# Patient Record
Sex: Male | Born: 1940
Health system: Southern US, Community
[De-identification: ages and names within clinical notes are randomized; demographics above are authoritative.]

## PROBLEM LIST (undated history)

## (undated) DIAGNOSIS — R400 Somnolence: Secondary | ICD-10-CM

## (undated) DIAGNOSIS — I739 Peripheral vascular disease, unspecified: Secondary | ICD-10-CM

## (undated) DIAGNOSIS — E7800 Pure hypercholesterolemia, unspecified: Secondary | ICD-10-CM

## (undated) DIAGNOSIS — I10 Essential (primary) hypertension: Secondary | ICD-10-CM

## (undated) DIAGNOSIS — Z9989 Dependence on other enabling machines and devices: Secondary | ICD-10-CM

## (undated) DIAGNOSIS — K219 Gastro-esophageal reflux disease without esophagitis: Secondary | ICD-10-CM

## (undated) DIAGNOSIS — C61 Malignant neoplasm of prostate: Secondary | ICD-10-CM

## (undated) DIAGNOSIS — G7241 Inclusion body myositis [IBM]: Secondary | ICD-10-CM

## (undated) DIAGNOSIS — T148XXA Other injury of unspecified body region, initial encounter: Secondary | ICD-10-CM

## (undated) DIAGNOSIS — I509 Heart failure, unspecified: Secondary | ICD-10-CM

## (undated) DIAGNOSIS — H409 Unspecified glaucoma: Secondary | ICD-10-CM

## (undated) DIAGNOSIS — G4730 Sleep apnea, unspecified: Secondary | ICD-10-CM

## (undated) DIAGNOSIS — Z973 Presence of spectacles and contact lenses: Secondary | ICD-10-CM

## (undated) DIAGNOSIS — M1990 Unspecified osteoarthritis, unspecified site: Secondary | ICD-10-CM

## (undated) DIAGNOSIS — E119 Type 2 diabetes mellitus without complications: Secondary | ICD-10-CM

## (undated) DIAGNOSIS — Z77120 Contact with and (suspected) exposure to mold (toxic): Secondary | ICD-10-CM

## (undated) DIAGNOSIS — N189 Chronic kidney disease, unspecified: Secondary | ICD-10-CM

## (undated) DIAGNOSIS — E785 Hyperlipidemia, unspecified: Secondary | ICD-10-CM

## (undated) HISTORY — DX: Other injury of unspecified body region, initial encounter: T14.8XXA

## (undated) HISTORY — DX: Hyperlipidemia, unspecified: E78.5

## (undated) HISTORY — PX: PROSTATE BIOPSY: SHX241

## (undated) HISTORY — DX: Somnolence: R40.0

## (undated) HISTORY — DX: Contact with and (suspected) exposure to mold (toxic): Z77.120

---

## 2004-08-31 ENCOUNTER — Emergency Department (HOSPITAL_COMMUNITY): Admission: EM | Admit: 2004-08-31 | Discharge: 2004-08-31 | Payer: Self-pay | Admitting: Emergency Medicine

## 2004-10-16 ENCOUNTER — Emergency Department (HOSPITAL_COMMUNITY): Admission: EM | Admit: 2004-10-16 | Discharge: 2004-10-16 | Payer: Self-pay | Admitting: Internal Medicine

## 2012-10-02 ENCOUNTER — Encounter (HOSPITAL_COMMUNITY): Payer: Self-pay

## 2012-10-02 ENCOUNTER — Emergency Department (HOSPITAL_COMMUNITY): Payer: Medicare Other

## 2012-10-02 ENCOUNTER — Inpatient Hospital Stay (HOSPITAL_COMMUNITY): Payer: Medicare Other | Admitting: Certified Registered"

## 2012-10-02 ENCOUNTER — Encounter (HOSPITAL_COMMUNITY): Payer: Self-pay | Admitting: Certified Registered"

## 2012-10-02 ENCOUNTER — Observation Stay (HOSPITAL_COMMUNITY)
Admission: EM | Admit: 2012-10-02 | Discharge: 2012-10-04 | Disposition: A | Discharge status: Home / Self Care | Payer: Medicare Other | Attending: Orthopedic Surgery | Admitting: Orthopedic Surgery

## 2012-10-02 ENCOUNTER — Encounter (HOSPITAL_COMMUNITY)
Admission: EM | Disposition: A | Discharge status: Home / Self Care | Payer: Self-pay | Source: Home / Self Care | Attending: Emergency Medicine

## 2012-10-02 ENCOUNTER — Inpatient Hospital Stay (HOSPITAL_COMMUNITY): Payer: Medicare Other

## 2012-10-02 DIAGNOSIS — S82891A Other fracture of right lower leg, initial encounter for closed fracture: Secondary | ICD-10-CM

## 2012-10-02 DIAGNOSIS — W19XXXA Unspecified fall, initial encounter: Secondary | ICD-10-CM | POA: Insufficient documentation

## 2012-10-02 DIAGNOSIS — Z87891 Personal history of nicotine dependence: Secondary | ICD-10-CM | POA: Insufficient documentation

## 2012-10-02 DIAGNOSIS — S82843A Displaced bimalleolar fracture of unspecified lower leg, initial encounter for closed fracture: Principal | ICD-10-CM | POA: Insufficient documentation

## 2012-10-02 DIAGNOSIS — I10 Essential (primary) hypertension: Secondary | ICD-10-CM | POA: Insufficient documentation

## 2012-10-02 DIAGNOSIS — Z79899 Other long term (current) drug therapy: Secondary | ICD-10-CM | POA: Insufficient documentation

## 2012-10-02 DIAGNOSIS — K219 Gastro-esophageal reflux disease without esophagitis: Secondary | ICD-10-CM | POA: Insufficient documentation

## 2012-10-02 DIAGNOSIS — Z7982 Long term (current) use of aspirin: Secondary | ICD-10-CM | POA: Insufficient documentation

## 2012-10-02 DIAGNOSIS — E119 Type 2 diabetes mellitus without complications: Secondary | ICD-10-CM | POA: Insufficient documentation

## 2012-10-02 DIAGNOSIS — G5790 Unspecified mononeuropathy of unspecified lower limb: Secondary | ICD-10-CM | POA: Insufficient documentation

## 2012-10-02 DIAGNOSIS — Z794 Long term (current) use of insulin: Secondary | ICD-10-CM | POA: Insufficient documentation

## 2012-10-02 HISTORY — DX: Essential (primary) hypertension: I10

## 2012-10-02 HISTORY — DX: Type 2 diabetes mellitus without complications: E11.9

## 2012-10-02 HISTORY — PX: ORIF ANKLE FRACTURE: SHX5408

## 2012-10-02 HISTORY — DX: Gastro-esophageal reflux disease without esophagitis: K21.9

## 2012-10-02 HISTORY — DX: Unspecified glaucoma: H40.9

## 2012-10-02 LAB — CBC WITH DIFFERENTIAL/PLATELET
Basophils Absolute: 0 10*3/uL (ref 0.0–0.1)
Basophils Relative: 0 % (ref 0–1)
Eosinophils Absolute: 0.2 10*3/uL (ref 0.0–0.7)
Eosinophils Relative: 2 % (ref 0–5)
HCT: 43.4 % (ref 39.0–52.0)
Lymphs Abs: 2.4 10*3/uL (ref 0.7–4.0)
Monocytes Absolute: 0.7 10*3/uL (ref 0.1–1.0)
Monocytes Relative: 9 % (ref 3–12)
Neutro Abs: 4 10*3/uL (ref 1.7–7.7)
Neutrophils Relative %: 56 % (ref 43–77)

## 2012-10-02 LAB — BASIC METABOLIC PANEL
BUN: 17 mg/dL (ref 6–23)
Calcium: 9.5 mg/dL (ref 8.4–10.5)
Creatinine, Ser: 1.25 mg/dL (ref 0.50–1.35)
GFR calc non Af Amer: 57 mL/min — ABNORMAL LOW (ref >90)
Glucose, Bld: 277 mg/dL — ABNORMAL HIGH (ref 70–99)
Sodium: 133 mEq/L — ABNORMAL LOW (ref 135–145)

## 2012-10-02 LAB — GLUCOSE, CAPILLARY
Glucose-Capillary: 94 mg/dL (ref 70–99)
Glucose-Capillary: 88 mg/dL (ref 70–99)

## 2012-10-02 SURGERY — OPEN REDUCTION INTERNAL FIXATION (ORIF) ANKLE FRACTURE
Anesthesia: General | Site: Ankle | Laterality: Right | Wound class: Clean

## 2012-10-02 MED ORDER — PROPOFOL 10 MG/ML IV BOLUS
INTRAVENOUS | Status: DC | PRN
  Administered 2012-10-02: 150 mg via INTRAVENOUS

## 2012-10-02 MED ORDER — MORPHINE SULFATE 4 MG/ML IJ SOLN
INTRAMUSCULAR | Status: AC
  Administered 2012-10-02: 3 mg via INTRAVENOUS
  Filled 2012-10-02: qty 1

## 2012-10-02 MED ORDER — ACETAMINOPHEN 10 MG/ML IV SOLN
INTRAVENOUS | Status: AC
  Filled 2012-10-02: qty 100

## 2012-10-02 MED ORDER — CEFAZOLIN SODIUM-DEXTROSE 2-3 GM-% IV SOLR
INTRAVENOUS | Status: DC | PRN
  Administered 2012-10-02: 2 g via INTRAVENOUS

## 2012-10-02 MED ORDER — ACETAMINOPHEN 10 MG/ML IV SOLN
INTRAVENOUS | Status: DC | PRN
  Administered 2012-10-02: 1000 mg via INTRAVENOUS

## 2012-10-02 MED ORDER — ONDANSETRON HCL 4 MG/2ML IJ SOLN
INTRAMUSCULAR | Status: DC | PRN
  Administered 2012-10-02: 4 mg via INTRAVENOUS

## 2012-10-02 MED ORDER — BLISTEX EX OINT
TOPICAL_OINTMENT | CUTANEOUS | Status: AC
  Administered 2012-10-02: 1
  Filled 2012-10-02: qty 10

## 2012-10-02 MED ORDER — CEFAZOLIN SODIUM-DEXTROSE 2-3 GM-% IV SOLR
INTRAVENOUS | Status: AC
  Filled 2012-10-02: qty 50

## 2012-10-02 MED ORDER — FENTANYL CITRATE 0.05 MG/ML IJ SOLN
INTRAMUSCULAR | Status: DC | PRN
  Administered 2012-10-02 – 2012-10-03 (×3): 100 ug via INTRAVENOUS

## 2012-10-02 MED ORDER — 0.9 % SODIUM CHLORIDE (POUR BTL) OPTIME
TOPICAL | Status: DC | PRN
  Administered 2012-10-02: 2000 mL

## 2012-10-02 MED ORDER — DEXTROSE 50 % IV SOLN
INTRAVENOUS | Status: AC
  Filled 2012-10-02: qty 50

## 2012-10-02 MED ORDER — SUCCINYLCHOLINE CHLORIDE 20 MG/ML IJ SOLN
INTRAMUSCULAR | Status: DC | PRN
  Administered 2012-10-02: 140 mg via INTRAVENOUS

## 2012-10-02 MED ORDER — SODIUM CHLORIDE 0.9 % IV SOLN
INTRAVENOUS | Status: DC
  Administered 2012-10-02 (×2): via INTRAVENOUS

## 2012-10-02 MED ORDER — MORPHINE SULFATE 4 MG/ML IJ SOLN
3.0000 mg | INTRAMUSCULAR | Status: DC | PRN
  Administered 2012-10-02 (×2): 3 mg via INTRAVENOUS
  Filled 2012-10-02 (×2): qty 1

## 2012-10-02 MED ORDER — HYDROCODONE-ACETAMINOPHEN 5-325 MG PO TABS
2.0000 | ORAL_TABLET | Freq: Once | ORAL | Status: AC
  Administered 2012-10-02: 2 via ORAL
  Filled 2012-10-02: qty 2

## 2012-10-02 MED ORDER — DEXTROSE 50 % IV SOLN
INTRAVENOUS | Status: DC | PRN
  Administered 2012-10-02: .5 via INTRAVENOUS

## 2012-10-02 MED ORDER — BUPIVACAINE HCL (PF) 0.5 % IJ SOLN
INTRAMUSCULAR | Status: AC
  Filled 2012-10-02: qty 30

## 2012-10-02 MED ORDER — MIDAZOLAM HCL 5 MG/5ML IJ SOLN
INTRAMUSCULAR | Status: DC | PRN
  Administered 2012-10-02: 2 mg via INTRAVENOUS

## 2012-10-02 MED ORDER — LIDOCAINE HCL (CARDIAC) 20 MG/ML IV SOLN
INTRAVENOUS | Status: DC | PRN
  Administered 2012-10-02: 30 mg via INTRAVENOUS

## 2012-10-02 MED ORDER — PHENYLEPHRINE HCL 10 MG/ML IJ SOLN
INTRAMUSCULAR | Status: DC | PRN
  Administered 2012-10-02: 80 ug via INTRAVENOUS
  Administered 2012-10-02: 120 ug via INTRAVENOUS

## 2012-10-02 SURGICAL SUPPLY — 47 items
1.3 KWIRE ×4 IMPLANT
BAG ZIPLOCK 12X15 (MISCELLANEOUS) ×2 IMPLANT
BANDAGE ELASTIC 4 VELCRO ST LF (GAUZE/BANDAGES/DRESSINGS) ×2 IMPLANT
BANDAGE ELASTIC 6 VELCRO ST LF (GAUZE/BANDAGES/DRESSINGS) ×2 IMPLANT
BIT DRILL 2.7 QC CANN 155 (BIT) ×2 IMPLANT
BIT DRILL 3.5 QC 155 (BIT) ×2 IMPLANT
BIT DRILL QC 2.7 6.3IN  SHORT (BIT) ×1
BIT DRILL QC 2.7 6.3IN SHORT (BIT) ×1 IMPLANT
BNDG ESMARK 4X9 LF (GAUZE/BANDAGES/DRESSINGS) ×2 IMPLANT
CLOTH BEACON ORANGE TIMEOUT ST (SAFETY) ×2 IMPLANT
COVER SURGICAL LIGHT HANDLE (MISCELLANEOUS) ×2 IMPLANT
CUFF TOURN SGL QUICK 34 (TOURNIQUET CUFF) ×1
CUFF TRNQT CYL 34X4X40X1 (TOURNIQUET CUFF) ×1 IMPLANT
DECANTER SPIKE VIAL GLASS SM (MISCELLANEOUS) ×2 IMPLANT
DRAPE C-ARM 42X120 X-RAY (DRAPES) ×2 IMPLANT
DRAPE U-SHAPE 47X51 STRL (DRAPES) ×4 IMPLANT
DRSG PAD ABDOMINAL 8X10 ST (GAUZE/BANDAGES/DRESSINGS) ×10 IMPLANT
ELECT REM PT RETURN 9FT ADLT (ELECTROSURGICAL) ×2
ELECTRODE REM PT RTRN 9FT ADLT (ELECTROSURGICAL) ×1 IMPLANT
GAUZE XEROFORM 1X8 LF (GAUZE/BANDAGES/DRESSINGS) ×4 IMPLANT
GAUZE XEROFORM 5X9 LF (GAUZE/BANDAGES/DRESSINGS) ×2 IMPLANT
GLOVE SURG ORTHO 8.0 STRL STRW (GLOVE) ×2 IMPLANT
GOWN STRL NON-REIN LRG LVL3 (GOWN DISPOSABLE) ×2 IMPLANT
NEEDLE HYPO 22GX1.5 SAFETY (NEEDLE) ×2 IMPLANT
NS IRRIG 1000ML POUR BTL (IV SOLUTION) ×2 IMPLANT
PACK LOWER EXTREMITY WL (CUSTOM PROCEDURE TRAY) ×2 IMPLANT
PAD CAST 4YDX4 CTTN HI CHSV (CAST SUPPLIES) ×1 IMPLANT
PADDING CAST COTTON 4X4 STRL (CAST SUPPLIES) ×1
PADDING CAST COTTON 6X4 STRL (CAST SUPPLIES) ×2 IMPLANT
PLATE FIBULA 5 HOLE (Plate) ×2 IMPLANT
POSITIONER SURGICAL ARM (MISCELLANEOUS) ×2 IMPLANT
SCREW CANNULATED 4.1X40 (Screw) ×8 IMPLANT
SCREW LOCK 3.5X14 (Screw) ×2 IMPLANT
SCREW LOCK 3.5X8 (Screw) ×2 IMPLANT
SCREW NL 3.5X14 (Screw) ×4 IMPLANT
SCREW NL 3.5X22 (Screw) ×2 IMPLANT
SCREW NLCK 16X3.5XST CORT PRLC (Screw) ×2 IMPLANT
SCREW NONLOCK 3.5X16 (Screw) ×2 IMPLANT
SPONGE GAUZE 4X4 12PLY (GAUZE/BANDAGES/DRESSINGS) ×2 IMPLANT
STOCKINETTE 8 INCH (MISCELLANEOUS) ×2 IMPLANT
SUT ETHILON 3 0 PS 1 (SUTURE) ×10 IMPLANT
SUT ETHILON 4 0 PS 2 18 (SUTURE) ×4 IMPLANT
SUT VIC AB 2-0 CT1 27 (SUTURE) ×2
SUT VIC AB 2-0 CT1 TAPERPNT 27 (SUTURE) ×2 IMPLANT
SYR CONTROL 10ML LL (SYRINGE) ×2 IMPLANT
TOWEL OR 17X26 10 PK STRL BLUE (TOWEL DISPOSABLE) ×4 IMPLANT
WATER STERILE IRR 1500ML POUR (IV SOLUTION) ×2 IMPLANT

## 2012-10-02 NOTE — ED Notes (Signed)
Orthopedic technician at bedside to apply splint.

## 2012-10-02 NOTE — Anesthesia Preprocedure Evaluation (Addendum)
Anesthesia Evaluation  Patient identified by MRN, date of birth, ID band Patient awake    Reviewed: Allergy & Precautions, H&P , NPO status , Patient's Chart, lab work & pertinent test results  Airway Mallampati: III TM Distance: >3 FB Neck ROM: Full    Dental  (+) Teeth Intact and Dental Advisory Given   Pulmonary neg pulmonary ROS, former smoker,  breath sounds clear to auscultation  Pulmonary exam normal       Cardiovascular hypertension, Pt. on medications Rhythm:Regular Rate:Normal     Neuro/Psych negative neurological ROS  negative psych ROS   GI/Hepatic Neg liver ROS, GERD-  Medicated,  Endo/Other  diabetes, Type 2, Insulin DependentMorbid obesity  Renal/GU negative Renal ROS  negative genitourinary   Musculoskeletal negative musculoskeletal ROS (+)   Abdominal   Peds  Hematology negative hematology ROS (+)   Anesthesia Other Findings   Reproductive/Obstetrics                          Anesthesia Physical Anesthesia Plan  ASA: III  Anesthesia Plan: General   Post-op Pain Management:    Induction: Intravenous  Airway Management Planned: Oral ETT  Additional Equipment:   Intra-op Plan:   Post-operative Plan: Extubation in OR  Informed Consent: I have reviewed the patients History and Physical, chart, labs and discussed the procedure including the risks, benefits and alternatives for the proposed anesthesia with the patient or authorized representative who has indicated his/her understanding and acceptance.   Dental advisory given  Plan Discussed with: CRNA  Anesthesia Plan Comments:         Anesthesia Quick Evaluation

## 2012-10-02 NOTE — ED Notes (Signed)
Pt c/o R ankle injury, after a fall this morning.  Pain score 5/10.  Sts "it felt like my knee gave away and I rolled my R ankle."  Sts he has broken R ankle before x 2.  Denies numbness and tingling.  Swelling noted.

## 2012-10-02 NOTE — H&P (Signed)
Andrew Larsen is an 72 y.o. male.   Chief Complaint: Right ankle pain HPI: Andrew Larsen is an ambulatory 72 year old patient with right ankle pain. He sustained a fall today when his right knee gave out and he heard 2 pops in his been noted to weight-bear since that time he reports pain in the right ankle but denies any loss of consciousness or any other orthopedic complaints. There is no family history of deep vein thrombosis or pulmonary embolism. Patient does have diabetes which is under reasonable control with recent hemoglobin A1c 7-1/2 approximately per patient's wife. Patient's wife does have cancer and is under chemotherapy treatment.  Past Medical History  Diagnosis Date  . Diabetes mellitus without complication   . Hypertension   . Glaucoma   . GERD (gastroesophageal reflux disease)     History reviewed. No pertinent past surgical history.  History reviewed. No pertinent family history. Social History:  reports that he has quit smoking. He has never used smokeless tobacco. He reports that he does not drink alcohol or use illicit drugs.  Allergies: Not on File  Medications Prior to Admission  Medication Sig Dispense Refill  . Ascorbic Acid 500 MG CAPS Take 2 capsules by mouth 2 (two) times daily.      Marland Kitchen aspirin EC 81 MG tablet Take 81 mg by mouth every evening.      . brimonidine-timolol (COMBIGAN) 0.2-0.5 % ophthalmic solution Place 1 drop into both eyes 2 (two) times daily.      . fish oil-omega-3 fatty acids 1000 MG capsule Take 1 g by mouth every morning.      . gabapentin (NEURONTIN) 300 MG capsule Take 300 mg by mouth 3 (three) times daily.      . insulin lispro protamine-lispro (HUMALOG 75/25) (75-25) 100 UNIT/ML SUSP Inject 44-49 Units into the skin 2 (two) times daily. 44 units in the morning, 49 units in the evening      . latanoprost (XALATAN) 0.005 % ophthalmic solution Place 1 drop into both eyes at bedtime.      Marland Kitchen losartan (COZAAR) 50 MG tablet Take 50 mg by mouth every  morning.      . Multiple Vitamins-Minerals (CENTRUM SILVER ADULT 50+ PO) Take 1 tablet by mouth every morning.      Marland Kitchen omeprazole (PRILOSEC) 40 MG capsule Take 40 mg by mouth every morning.      . Saw Palmetto, Serenoa repens, (SAW PALMETTO PO) Take 2 capsules by mouth 2 (two) times daily.      Marland Kitchen triamterene-hydrochlorothiazide (DYAZIDE) 37.5-25 MG per capsule Take 1 capsule by mouth every morning.        Results for orders placed during the hospital encounter of 10/02/12 (from the past 48 hour(s))  CBC WITH DIFFERENTIAL     Status: None   Collection Time    10/02/12 10:17 AM      Result Value Range   WBC 7.2  4.0 - 10.5 K/uL   RBC 4.89  4.22 - 5.81 MIL/uL   Hemoglobin 15.3  13.0 - 17.0 g/dL   HCT 16.1  09.6 - 04.5 %   MCV 88.8  78.0 - 100.0 fL   MCH 31.3  26.0 - 34.0 pg   MCHC 35.3  30.0 - 36.0 g/dL   RDW 40.9  81.1 - 91.4 %   Platelets 215  150 - 400 K/uL   Neutrophils Relative % 56  43 - 77 %   Neutro Abs 4.0  1.7 - 7.7 K/uL  Lymphocytes Relative 33  12 - 46 %   Lymphs Abs 2.4  0.7 - 4.0 K/uL   Monocytes Relative 9  3 - 12 %   Monocytes Absolute 0.7  0.1 - 1.0 K/uL   Eosinophils Relative 2  0 - 5 %   Eosinophils Absolute 0.2  0.0 - 0.7 K/uL   Basophils Relative 0  0 - 1 %   Basophils Absolute 0.0  0.0 - 0.1 K/uL  BASIC METABOLIC PANEL     Status: Abnormal   Collection Time    10/02/12 10:17 AM      Result Value Range   Sodium 133 (*) 135 - 145 mEq/L   Potassium 4.4  3.5 - 5.1 mEq/L   Chloride 95 (*) 96 - 112 mEq/L   CO2 29  19 - 32 mEq/L   Glucose, Bld 277 (*) 70 - 99 mg/dL   BUN 17  6 - 23 mg/dL   Creatinine, Ser 1.61  0.50 - 1.35 mg/dL   Calcium 9.5  8.4 - 09.6 mg/dL   GFR calc non Af Amer 57 (*) >90 mL/min   GFR calc Af Amer 66 (*) >90 mL/min   Comment:            The eGFR has been calculated     using the CKD EPI equation.     This calculation has not been     validated in all clinical     situations.     eGFR's persistently     <90 mL/min signify      possible Chronic Kidney Disease.  SURGICAL PCR SCREEN     Status: None   Collection Time    10/02/12  3:13 PM      Result Value Range   MRSA, PCR NEGATIVE  NEGATIVE   Staphylococcus aureus NEGATIVE  NEGATIVE   Comment:            The Xpert SA Assay (FDA     approved for NASAL specimens     in patients over 74 years of age),     is one component of     a comprehensive surveillance     program.  Test performance has     been validated by [employer] for patients greater     than or equal to 31 year old.     It is not intended     to diagnose infection nor to     guide or monitor treatment.  GLUCOSE, CAPILLARY     Status: Abnormal   Collection Time    10/02/12  4:31 PM      Result Value Range   Glucose-Capillary 140 (*) 70 - 99 mg/dL   Dg Chest 2 View  0/07/5407   *RADIOLOGY REPORT*  Clinical Data: Fall.  Preop right ankle surgery.Diabetic.  Prior smoker.  CHEST - 2 VIEW  Comparison: None.  Findings: Heart and mediastinal contours are within normal limits. No focal opacities or effusions.  No acute bony abnormality.  IMPRESSION: No active cardiopulmonary disease.   Original Report Authenticated By: Charlett Nose, M.D.   Dg Ankle 2 Views Right  10/02/2012   *RADIOLOGY REPORT*  Clinical Data: Right ankle pain post fall  RIGHT ANKLE - 2 VIEW  Comparison: 08/31/2004  Findings: Two views of the right ankle submitted.  There is oblique displaced fracture of distal fibula.  Mild displaced fracture of distal tibia medial malleolus.  Disruption of the ankle mortise with mild medial  displacement of the distal tibia and fibula. There is soft tissue swelling adjacent to medial and lateral malleolus.  IMPRESSION:  There is oblique displaced fracture of distal fibula.  Mild displaced fracture of distal tibia medial malleolus.  Disruption of the ankle mortise with mild medial displacement of the distal tibia and fibula.  There is soft tissue swelling adjacent to medial and lateral malleolus.   Original  Report Authenticated By: Natasha Mead, M.D.    Review of Systems  Constitutional: Negative.   HENT: Negative.   Eyes: Negative.   Respiratory: Negative.   Cardiovascular: Negative.   Gastrointestinal: Negative.   Genitourinary: Negative.   Musculoskeletal: Positive for joint pain.  Skin: Negative.   Neurological: Negative.   Endo/Heme/Allergies: Negative.   Psychiatric/Behavioral: Negative.     Blood pressure 136/77, pulse 85, temperature 98.9 F (37.2 C), temperature source Oral, resp. rate 16, height 5\' 11"  (1.803 m), weight 113.399 kg (250 lb), SpO2 97.00%. Physical Exam  Constitutional: He appears well-developed.  HENT:  Head: Normocephalic.  Eyes: Pupils are equal, round, and reactive to light.  Neck: Normal range of motion.  Cardiovascular: Normal rate.   Respiratory: Effort normal.  GI: Soft.  Neurological: He is alert.  Skin: Skin is warm.   examination the right ankle demonstrates medial and lateral swelling no calf tenderness palpable pedal pulses diminished sensation of the dorsum plantar aspect of the foot no lesser toe deformities no other ulcerations full-thickness skin changes in the skin on the foot knee is examined and has reasonable range of motion with no effusion in tact extensor mechanism  Assessment/Plan Impression is displaced bimalleolar ankle fracture on the right in a diabetic patient. Plan open reduction internal fixation of medial and lateral malleolus. Risk and benefits discussed with the patient including but limited to infection incomplete healing nerve vessel damage need for more surgery. All questions answered.  DEAN,GREGORY SCOTT 10/02/2012, 6:59 PM

## 2012-10-02 NOTE — ED Provider Notes (Signed)
History     CSN: [id number]  Arrival date & time 10/02/12  0746   First MD Initiated Contact with Patient 10/02/12 [phone]      Chief Complaint  Patient presents with  . Ankle Injury    (Consider location/radiation/quality/duration/timing/severity/associated sxs/prior treatment) Patient is a 72 y.o. male presenting with lower extremity injury.  Ankle Injury   patient here after injuring his right ankle this morning by twisting it when his knee became weak. Denies any pain. Can walk on his ankle but has severe sharp pain. Noted swelling in her Pop. Denies any paresthesias to his foot. No treatment used prior to arrival. Does have a history of prior ankle injury  Past Medical History  Diagnosis Date  . Diabetes mellitus without complication   . Hypertension   . Glaucoma   . GERD (gastroesophageal reflux disease)     History reviewed. No pertinent past surgical history.  History reviewed. No pertinent family history.  History  Substance Use Topics  . Smoking status: Former Games developer  . Smokeless tobacco: Never Used  . Alcohol Use: No      Review of Systems  All other systems reviewed and are negative.    Allergies  Review of patient's allergies indicates not on file.  Home Medications  No current outpatient prescriptions on file.  BP 156/77  Pulse 85  Temp(Src) 98.9 F (37.2 C) (Oral)  Resp 16  SpO2 97%  Physical Exam  Nursing note and vitals reviewed. Constitutional: He is oriented to person, place, and time. He appears well-developed and well-nourished.  Non-toxic appearance. No distress.  HENT:  Head: Normocephalic and atraumatic.  Eyes: Conjunctivae, EOM and lids are normal. Pupils are equal, round, and reactive to light.  Neck: Normal range of motion. Neck supple. No tracheal deviation present. No mass present.  Cardiovascular: Normal rate, regular rhythm and normal heart sounds.  Exam reveals no gallop.   No murmur heard. Pulmonary/Chest: Effort normal  and breath sounds normal. No stridor. No respiratory distress. He has no decreased breath sounds. He has no wheezes. He has no rhonchi. He has no rales.  Abdominal: Soft. Normal appearance and bowel sounds are normal. He exhibits no distension. There is no tenderness. There is no rebound and no CVA tenderness.  Musculoskeletal: Normal range of motion. He exhibits no edema and no tenderness.       Feet:  Neurological: He is alert and oriented to person, place, and time. He has normal strength. No cranial nerve deficit or sensory deficit. GCS eye subscore is 4. GCS verbal subscore is 5. GCS motor subscore is 6.  Skin: Skin is warm and dry. No abrasion and no rash noted.  Psychiatric: He has a normal mood and affect. His speech is normal and behavior is normal.    ED Course  Procedures (including critical care time)  Labs Reviewed - No data to display No results found.   No diagnosis found.    MDM  Pt given vicodin for pain anjd feels better--spoke with dr. August Saucer, and he will take the pt to the or later today--pts pain controlled        Toy Baker, MD 10/02/12 [phone]

## 2012-10-02 NOTE — ED Notes (Signed)
Patient transported to X-ray 

## 2012-10-02 NOTE — Progress Notes (Signed)
Patient reports feeling like his glucose is low. CBG result 88. Attempt to notify Dr. August Saucer.  Surgery ready for patient. Will take patient to surgery room.

## 2012-10-03 ENCOUNTER — Observation Stay (HOSPITAL_COMMUNITY): Payer: Medicare Other

## 2012-10-03 LAB — GLUCOSE, CAPILLARY
Glucose-Capillary: 131 mg/dL — ABNORMAL HIGH (ref 70–99)
Glucose-Capillary: 132 mg/dL — ABNORMAL HIGH (ref 70–99)
Glucose-Capillary: 179 mg/dL — ABNORMAL HIGH (ref 70–99)
Glucose-Capillary: 189 mg/dL — ABNORMAL HIGH (ref 70–99)
Glucose-Capillary: 250 mg/dL — ABNORMAL HIGH (ref 70–99)

## 2012-10-03 LAB — PROTIME-INR
INR: 0.96 (ref 0.00–1.49)
Prothrombin Time: 12.7 seconds (ref 11.6–15.2)

## 2012-10-03 MED ORDER — BRIMONIDINE TARTRATE 0.2 % OP SOLN
1.0000 [drp] | Freq: Two times a day (BID) | OPHTHALMIC | Status: DC
  Administered 2012-10-03 – 2012-10-04 (×3): 1 [drp] via OPHTHALMIC
  Filled 2012-10-03: qty 5

## 2012-10-03 MED ORDER — PANTOPRAZOLE SODIUM 40 MG PO TBEC
40.0000 mg | DELAYED_RELEASE_TABLET | Freq: Every day | ORAL | Status: DC
  Administered 2012-10-03 – 2012-10-04 (×2): 40 mg via ORAL
  Filled 2012-10-03 (×2): qty 1

## 2012-10-03 MED ORDER — MORPHINE SULFATE 2 MG/ML IJ SOLN
1.0000 mg | INTRAMUSCULAR | Status: DC | PRN
  Administered 2012-10-03: 1 mg via INTRAVENOUS
  Filled 2012-10-03: qty 1

## 2012-10-03 MED ORDER — WARFARIN VIDEO
Freq: Once | Status: AC
  Administered 2012-10-03: 18:00:00

## 2012-10-03 MED ORDER — COUMADIN BOOK
Freq: Once | Status: AC
  Administered 2012-10-03: 13:00:00
  Filled 2012-10-03: qty 1

## 2012-10-03 MED ORDER — LATANOPROST 0.005 % OP SOLN
1.0000 [drp] | Freq: Every day | OPHTHALMIC | Status: DC
  Administered 2012-10-03 (×2): 1 [drp] via OPHTHALMIC
  Filled 2012-10-03: qty 2.5

## 2012-10-03 MED ORDER — WARFARIN SODIUM 7.5 MG PO TABS
7.5000 mg | ORAL_TABLET | Freq: Once | ORAL | Status: AC
  Administered 2012-10-03: 7.5 mg via ORAL
  Filled 2012-10-03: qty 1

## 2012-10-03 MED ORDER — METOCLOPRAMIDE HCL 5 MG/ML IJ SOLN: 5.0000 mg | Freq: Three times a day (TID) | INTRAMUSCULAR | Status: DC | PRN

## 2012-10-03 MED ORDER — LOSARTAN POTASSIUM 50 MG PO TABS
50.0000 mg | ORAL_TABLET | Freq: Every morning | ORAL | Status: DC
  Administered 2012-10-03 – 2012-10-04 (×2): 50 mg via ORAL
  Filled 2012-10-03 (×2): qty 1

## 2012-10-03 MED ORDER — WARFARIN SODIUM 5 MG PO TABS: 5.0000 mg | ORAL_TABLET | Freq: Every day | ORAL | Status: DC

## 2012-10-03 MED ORDER — WARFARIN - PHARMACIST DOSING INPATIENT: Freq: Every day | Status: DC

## 2012-10-03 MED ORDER — METOCLOPRAMIDE HCL 10 MG PO TABS: 5.0000 mg | ORAL_TABLET | Freq: Three times a day (TID) | ORAL | Status: DC | PRN

## 2012-10-03 MED ORDER — OXYCODONE HCL 5 MG PO TABS
5.0000 mg | ORAL_TABLET | ORAL | Status: DC | PRN
  Administered 2012-10-03: 5 mg via ORAL
  Administered 2012-10-03 – 2012-10-04 (×7): 10 mg via ORAL
  Filled 2012-10-03: qty 2
  Filled 2012-10-03: qty 1
  Filled 2012-10-03 (×6): qty 2

## 2012-10-03 MED ORDER — CEFAZOLIN SODIUM-DEXTROSE 2-3 GM-% IV SOLR
2.0000 g | Freq: Four times a day (QID) | INTRAVENOUS | Status: AC
  Administered 2012-10-03 (×3): 2 g via INTRAVENOUS
  Filled 2012-10-03 (×3): qty 50

## 2012-10-03 MED ORDER — LACTATED RINGERS IV SOLN
INTRAVENOUS | Status: DC | PRN
  Administered 2012-10-02: 23:00:00 via INTRAVENOUS

## 2012-10-03 MED ORDER — INSULIN ASPART 100 UNIT/ML ~~LOC~~ SOLN
0.0000 [IU] | Freq: Three times a day (TID) | SUBCUTANEOUS | Status: DC
  Administered 2012-10-03 (×3): 3 [IU] via SUBCUTANEOUS
  Administered 2012-10-04: 8 [IU] via SUBCUTANEOUS
  Administered 2012-10-04: 5 [IU] via SUBCUTANEOUS

## 2012-10-03 MED ORDER — OXYCODONE HCL 5 MG PO TABS: 5.0000 mg | ORAL_TABLET | ORAL | Status: DC | PRN

## 2012-10-03 MED ORDER — ONDANSETRON HCL 4 MG PO TABS: 4.0000 mg | ORAL_TABLET | Freq: Four times a day (QID) | ORAL | Status: DC | PRN

## 2012-10-03 MED ORDER — LACTATED RINGERS IV SOLN: INTRAVENOUS | Status: DC

## 2012-10-03 MED ORDER — HYDROMORPHONE HCL PF 1 MG/ML IJ SOLN: 0.2500 mg | INTRAMUSCULAR | Status: DC | PRN

## 2012-10-03 MED ORDER — POTASSIUM CHLORIDE IN NACL 20-0.9 MEQ/L-% IV SOLN
INTRAVENOUS | Status: AC
  Administered 2012-10-03: 03:00:00 via INTRAVENOUS
  Filled 2012-10-03: qty 1000

## 2012-10-03 MED ORDER — GABAPENTIN 300 MG PO CAPS
300.0000 mg | ORAL_CAPSULE | Freq: Three times a day (TID) | ORAL | Status: DC
  Administered 2012-10-03 – 2012-10-04 (×4): 300 mg via ORAL
  Filled 2012-10-03 (×6): qty 1

## 2012-10-03 MED ORDER — BRIMONIDINE TARTRATE-TIMOLOL 0.2-0.5 % OP SOLN
1.0000 [drp] | Freq: Two times a day (BID) | OPHTHALMIC | Status: DC
  Filled 2012-10-03 (×19): qty 0.1

## 2012-10-03 MED ORDER — TIMOLOL MALEATE 0.5 % OP SOLN
1.0000 [drp] | Freq: Two times a day (BID) | OPHTHALMIC | Status: DC
  Administered 2012-10-03 – 2012-10-04 (×3): 1 [drp] via OPHTHALMIC
  Filled 2012-10-03: qty 5

## 2012-10-03 MED ORDER — ONDANSETRON HCL 4 MG/2ML IJ SOLN: 4.0000 mg | Freq: Four times a day (QID) | INTRAMUSCULAR | Status: DC | PRN

## 2012-10-03 MED ORDER — PROMETHAZINE HCL 25 MG/ML IJ SOLN: 6.2500 mg | INTRAMUSCULAR | Status: DC | PRN

## 2012-10-03 MED ORDER — TRIAMTERENE-HCTZ 37.5-25 MG PO TABS
1.0000 | ORAL_TABLET | Freq: Every morning | ORAL | Status: DC
  Administered 2012-10-03 – 2012-10-04 (×2): 1 via ORAL
  Filled 2012-10-03 (×2): qty 1

## 2012-10-03 NOTE — Transfer of Care (Signed)
Immediate Anesthesia Transfer of Care Note  Patient: Andrew Larsen  Procedure(s) Performed: Procedure(s): OPEN REDUCTION INTERNAL FIXATION (ORIF) ANKLE FRACTURE (Right)  Patient Location: PACU  Anesthesia Type:General  Level of Consciousness: awake, alert , patient cooperative and responds to stimulation  Airway & Oxygen Therapy: Patient Spontanous Breathing and Patient connected to face mask oxygen  Post-op Assessment: Report given to PACU RN, Post -op Vital signs reviewed and stable and Patient moving all extremities X 4  Post vital signs: stable  Complications: No apparent anesthesia complications

## 2012-10-03 NOTE — Progress Notes (Signed)
Rehab Admissions Coordinator Note:  Patient was screened by Clois Dupes for appropriateness for an Inpatient Acute Rehab Consult. [employer] Medicare will not approve admission for this diagnosis. At this time, we are recommending Skilled Nursing Facility.  Clois Dupes 10/03/2012, 12:20 PM  I can be reached at [phone].

## 2012-10-03 NOTE — Op Note (Signed)
NAME:  TAVI, HOOGENDOORN NO.:  [id number]  MEDICAL RECORD NO.:  [id number]  LOCATION:  1605                         FACILITY:  [facility]  PHYSICIAN:  Burnard Bunting, M.D.    DATE OF BIRTH:  [birth year]  DATE OF PROCEDURE: DATE OF DISCHARGE:                              OPERATIVE REPORT   PREOPERATIVE DIAGNOSIS:  Right bimalleolar ankle fracture.  POSTOPERATIVE DIAGNOSIS:  Right bimalleolar ankle fracture.Marland Kitchen  PROCEDURE:  Open reduction and internal fixation with bimalleolar ankle fracture.  SURGEON:  Burnard Bunting, M.D.  ASSIST:  None.  ANESTHESIA:  General.  ESTIMATED BLOOD LOSS:  Minimal.  TOURNIQUET:  Ankle Esmarch utilized for approximately 35 minutes.  INDICATIONS:  Tatem Fesler is a patient with unstable bimalleolar ankle fracture presents for operative management after explanation of risks, benefits.  PROCEDURE IN DETAIL:  The patient was brought to operating room, where general endotracheal anesthesia was induced.  Preop antibiotics administered.  Time-out was called.  Right leg was prescrubbed with alcohol and Betadine.  Area was then prepped with DuraPrep solution and draped in a sterile manner.  Collier Flowers was used cover the operative field. Leg was elevated and exsanguinated using Esmarch wrap for an ankle Esmarch just above the anticipated level fracture fixation.  Skin and subcu tissue was sharply divided on the lateral side.  Care was taken to avoid injury to superficial peroneal nerve.  Fracture was visualized and reduced.  Lag screw was placed anterior to posterior.  Then Katrinka Blazing and Nephew 5-hole plate was placed after fluoroscopic examination and confirmation of anatomic reduction.  Attention was then turned toward the medial side.  Incision was made over the medial malleolus.  Skin and subcu tissues were sharply divided.  Fracture was reduced.  Two 40-mm cannulated cancellous screws were placed, and the mortise was reduced in the AP, lateral, and  mortise planes.  Good reduction achieved.  The patient had good ankle range of motion.  The syndesmosis was stable.  At this time, ankle Esmarch was released.  Both incisions were thoroughly irrigated and closed using interrupted inverted 2-0 Vicryl and 3-0 nylon.  Bulky well-padded posterior splint was applied.  The patient tolerated the procedure without immediate complications with plan to discharge in the a.m. touchdown weightbearing.     Burnard Bunting, M.D.     GSD/MEDQ  D:  10/02/2012  T:  10/03/2012  Job:  [id number]

## 2012-10-03 NOTE — Progress Notes (Signed)
ANTICOAGULATION CONSULT NOTE - Initial Consult  Pharmacy Consult for Warfarin Indication: VTE prophylaxis  Not on File  Patient Measurements: Height: 5\' 11"  (180.3 cm) Weight: 250 lb (113.399 kg) IBW/kg (Calculated) : 75.3   Vital Signs: Temp: 97.7 F (36.5 C) (06/06 0135) Temp src: Oral (06/05 2121) BP: 150/86 mmHg (06/06 0135) Pulse Rate: 84 (06/06 0135)  Labs:  Recent Labs  10/02/12 1017  HGB 15.3  HCT 43.4  PLT 215  CREATININE 1.25    Estimated Creatinine Clearance: 70.4 ml/min (by C-G formula based on Cr of 1.25).   Medical History: Past Medical History  Diagnosis Date  . Diabetes mellitus without complication   . Hypertension   . Glaucoma   . GERD (gastroesophageal reflux disease)     Medications:  Scheduled:  . brimonidine  1 drop Both Eyes BID   And  . timolol  1 drop Both Eyes BID  .  ceFAZolin (ANCEF) IV  2 g Intravenous Q6H  . gabapentin  300 mg Oral TID  . insulin aspart  0-15 Units Subcutaneous TID WC  . latanoprost  1 drop Both Eyes QHS  . losartan  50 mg Oral q morning - 10a  . pantoprazole  40 mg Oral Daily  . triamterene-hydrochlorothiazide  1 tablet Oral q morning - 10a   Infusions:  . 0.9 % NaCl with KCl 20 mEq / L    . lactated ringers      Assessment: 72 yo s/p surgery for right ankle fracture. Warfarin per Rx for VTE prophylaxis ordered. (7 points)  Goal of Therapy:  INR 2-3    Plan:   Baseline INR=0.96 this am  Warfarin 7.5mg  x1 today  Daily PT/INR  Education  Susanne Greenhouse R 10/03/2012,2:11 AM

## 2012-10-03 NOTE — Care Management Note (Addendum)
    Page 1 of 2   10/04/2012     12:08:27 PM   CARE MANAGEMENT NOTE 10/04/2012  Patient:  Andrew Larsen, Andrew Larsen   Account Number:  [id number]  Date Initiated:  10/03/2012  Documentation initiated by:  [name]  Subjective/Objective Assessment:   dx rt bimalleolar ankle fracture: ORIF     Action/Plan:   CM spoke with patient; Wants inpatient rehab 1st choice and home with hh as 2nd choice for discharge plans.   Anticipated DC Date:  10/04/2012   Anticipated DC Plan:  HOME W HOME HEALTH SERVICES  In-house referral  Clinical Social Worker      DC Associate Professor  CM consult      [facility] Choice  HOME HEALTH  DURABLE MEDICAL EQUIPMENT   Choice offered to / List presented to:  C-1 Patient   DME arranged  WHEELCHAIR - MANUAL  3-N-1  Levan Hurst      DME agency  [facility]     HH arranged  HH-1 RN  HH-2 PT      [facility] agency  [facility]   Status of service:  Completed, signed off Medicare Important Message given?   (If response is "NO", the following Medicare IM given date fields will be blank) Date Medicare IM given:   Date Additional Medicare IM given:    Discharge Disposition:  HOME W HOME HEALTH SERVICES  Per UR Regulation:  Reviewed for med. necessity/level of care/duration of stay  If discussed at Long Length of Stay Meetings, dates discussed:    Comments:  10/04/12 [name] RN,BSN NCM [phone] 12P SPOKE TO NURSE-[name] NEXT PT/INR DUE TOMORROW.[name] REP AWARE FOR HHRN-HOME VISIT TOMORROW FOR PT/INR LAB DRAW,SEND RESULTS TO ORTHOPEDIC DR.PER [facility] REP [name] HHPT MAY START ON MONDAY 10/06/12.DTR-Andrew Larsen MADE AWARE ALSO. PATIENT WILL STAY W/DTR-Andrew Larsen ADDRESS:[address], [place] [address] H#[phone],C#[phone].TC [name] [facility] REP MADER HER AWARE OF ADDRESS WHERE PATIENT WILL STAY,WILL ALSO FAX INTO [facility] FAX#W/ADDRESS ON FACE SHEET,FAX# W/CONFIRMATION #[phone]. [facility] DME BROUGHT TO RM,W/C,RW,3N1.[facility]  CHOSEN FOR HHRN/PT SPOKE TO [name] REP TEL#[phone], HAS ALL INFO,& AWARE OF D/C TODAY W/HH.SPOKE TO PATIENT/SPOUSE/DTR ABOUT D/C PLANS,& IN AGREEMENT TO PLAN.  10/03/2012 [name] BSN RN CCM [phone] Per progress notes  pt's insurance will not approve inpatient rehab. Cm talked with patient and his current plans are to go home in [place] where spouse and other family members will be caregivers. He will need wheelchair with cushion and elevated leg rest on right, 3n1 and RW. Pt will also require HHrn for coumadin management and HHpt services. Pt's daughter has requested Andrew Larsen for [facility] services. TCT [place] rep who advised that they would be able to services patient. Pt and daughter advised.  [facility] delivered 3n1 and RW to patient's room. Wheelchair will be delivered to patient's room tomorrow 10/04/2012.

## 2012-10-03 NOTE — Evaluation (Signed)
Physical Therapy Evaluation Patient Details Name: Andrew Larsen MRN: [id number] DOB: 1941-01-24 Today's Date: 10/03/2012 Time: 5784-6962 PT Time Calculation (min): 23 min  PT Assessment / Plan / Recommendation Clinical Impression  Pt presents with bimalleolar ankle fracture s/p ORIF and is currently TDWB.  Tolerated OOB and a few hops forward, however with increased difficulty following WB status and has increased pain.  Pt will benefit from skilled PT in acute venue to address deficits.  PT recommends CIR consult to determine if appropriate for CIR.  If not, then pt will need to consider ST SNF placement.      PT Assessment  Patient needs continued PT services    Follow Up Recommendations  CIR;Supervision/Assistance - 24 hour    Does the patient have the potential to tolerate intense rehabilitation      Barriers to Discharge Decreased caregiver support wife is with him 24/7, however she is currently undergoing chemotherapy.     Equipment Recommendations  Rolling walker with 5" wheels (w/c and cushion if D/C home)    Recommendations for Other Services     Frequency Min 4X/week    Precautions / Restrictions Precautions Precautions: Fall Restrictions Weight Bearing Restrictions: Yes RLE Weight Bearing: Touchdown weight bearing   Pertinent Vitals/Pain 6/10, premedicated.       Mobility  Bed Mobility Bed Mobility: Supine to Sit Supine to Sit: 4: Min assist;HOB elevated Details for Bed Mobility Assistance: Assist for RLE out of bed with cues for hand placement and technique to self assist.  Transfers Transfers: Sit to Stand;Stand to Sit Sit to Stand: 1: +2 Total assist;From elevated surface;With upper extremity assist;From bed Sit to Stand: Patient Percentage: 60% Stand to Sit: 1: +2 Total assist;With upper extremity assist;With armrests;To chair/3-in-1 Stand to Sit: Patient Percentage: 60% Details for Transfer Assistance: Assist to rise, steady and ensure controlled  descent with MAX cues for maintaining TDWB status and hand placement.   Ambulation/Gait Ambulation/Gait Assistance: 1: +2 Total assist Ambulation/Gait: Patient Percentage: 50% Ambulation Distance (Feet): 4 Feet Assistive device: Rolling walker Ambulation/Gait Assistance Details: Cues for sequencing/technique with RW, upright posture and MAX cues for maintaining TDWB status.  Pt only able to hop forward a few small steps due to increased pain and inability to maintain TDWB status.   Gait Pattern: Step-to pattern;Antalgic;Trunk flexed Gait velocity: very slow Stairs: No Wheelchair Mobility Wheelchair Mobility: No    Exercises     PT Diagnosis: Difficulty walking;Abnormality of gait;Generalized weakness;Acute pain  PT Problem List: Decreased strength;Decreased range of motion;Decreased activity tolerance;Decreased balance;Decreased mobility;Decreased coordination;Decreased knowledge of use of DME;Decreased safety awareness;Pain PT Treatment Interventions: DME instruction;Gait training;Functional mobility training;Therapeutic activities;Therapeutic exercise;Balance training;Patient/family education   PT Goals Acute Rehab PT Goals PT Goal Formulation: With patient Time For Goal Achievement: 10/10/12 Potential to Achieve Goals: Good Pt will go Supine/Side to Sit: with supervision PT Goal: Supine/Side to Sit - Progress: Goal set today Pt will go Sit to Supine/Side: with supervision PT Goal: Sit to Supine/Side - Progress: Goal set today Pt will go Sit to Stand: with min assist PT Goal: Sit to Stand - Progress: Goal set today Pt will go Stand to Sit: with min assist PT Goal: Stand to Sit - Progress: Goal set today Pt will Ambulate: 16 - 50 feet;with least restrictive assistive device;with min assist PT Goal: Ambulate - Progress: Goal set today Additional Goals Additional Goal #1: Pt will be able to maintain TDWB status throughout all mobility.  PT Goal: Additional Goal #1 - Progress: Goal  set today  Visit Information  Last PT Received On: 10/03/12 Assistance Needed: +2    Subjective Data  Subjective: It doesn't hurt until I move Patient Stated Goal: to return home if possible   Prior Functioning  Home Living Lives With: Spouse Available Help at Discharge: Family;Available 24 hours/day Type of Home: Apartment Home Access: Level entry Home Layout: One level Bathroom Shower/Tub: Engineer, manufacturing systems: Standard Home Adaptive Equipment: None Prior Function Level of Independence: Independent Able to Take Stairs?: Yes Driving: Yes Vocation: Retired Musician: No difficulties    Copywriter, advertising Arousal/Alertness: Awake/alert Behavior During Therapy: WFL for tasks assessed/performed Overall Cognitive Status: Within Functional Limits for tasks assessed    Extremity/Trunk Assessment Right Lower Extremity Assessment RLE ROM/Strength/Tone: Unable to fully assess;Due to precautions;Due to pain RLE Sensation: WFL - Light Touch Left Lower Extremity Assessment LLE ROM/Strength/Tone: WFL for tasks assessed LLE Sensation: WFL - Light Touch Trunk Assessment Trunk Assessment: Normal   Balance    End of Session PT - End of Session Equipment Utilized During Treatment: Gait belt Activity Tolerance: Patient limited by fatigue;Patient limited by pain Patient left: in chair;with call bell/phone within reach;with family/visitor present Nurse Communication: Mobility status  GP     Vista Deck 10/03/2012, 11:40 AM

## 2012-10-03 NOTE — Progress Notes (Signed)
Subjective: Pt stable - pain controlled   Objective: Vital signs in last 24 hours: Temp:  [96.9 F (36.1 C)-99 F (37.2 C)] 97.9 F (36.6 C) (06/06 1014) Pulse Rate:  [75-93] 78 (06/06 1014) Resp:  [8-17] 16 (06/06 1014) BP: (132-190)/(74-95) 148/80 mmHg (06/06 1014) SpO2:  [95 %-100 %] 100 % (06/06 1014) Weight:  [113.399 kg (250 lb)] 113.399 kg (250 lb) (06/05 1623)  Intake/Output from previous day: 06/05 0701 - 06/06 0700 In: 1915.3 [I.V.:1915.3] Out: 700 [Urine:700] Intake/Output this shift: Total I/O In: 240 [P.O.:240] Out: -   Exam:  Neurovascular intact Sensation intact distally Intact pulses distally  Labs:  Recent Labs  10/02/12 1017  HGB 15.3    Recent Labs  10/02/12 1017  WBC 7.2  RBC 4.89  HCT 43.4  PLT 215    Recent Labs  10/02/12 1017  NA 133*  K 4.4  CL 95*  CO2 29  BUN 17  CREATININE 1.25  GLUCOSE 277*  CALCIUM 9.5    Recent Labs  10/03/12 0445  INR 0.96    Assessment/Plan: Plan dc am - rx on chart   Andrew Larsen SCOTT 10/03/2012, 11:49 AM

## 2012-10-03 NOTE — Progress Notes (Signed)
Physical Therapy Treatment Patient Details Name: Andrew Larsen MRN: [id number] DOB: Dec 12, 1940 Today's Date: 10/03/2012 Time: 4098-1191 PT Time Calculation (min): 19 min  PT Assessment / Plan / Recommendation Comments on Treatment Session  Pt progressing better than this morning with amb and able to perform some w/c mobility.  Pt feels he will have enough assist at home.  Recommend HHPT for follow up at D/C    Follow Up Recommendations  Home health PT;Supervision/Assistance - 24 hour     Does the patient have the potential to tolerate intense rehabilitation     Barriers to Discharge        Equipment Recommendations  Rolling walker with 5" wheels;Wheelchair (measurements PT);Wheelchair cushion (measurements PT)    Recommendations for Other Services    Frequency Min 4X/week   Plan Discharge plan remains appropriate    Precautions / Restrictions Precautions Precautions: Fall Restrictions Weight Bearing Restrictions: Yes RLE Weight Bearing: Touchdown weight bearing   Pertinent Vitals/Pain 5/10 pain, elevated ice applied    Mobility  Bed Mobility Bed Mobility: Sit to Supine Sit to Supine: 4: Min assist;HOB flat Details for Bed Mobility Assistance: Assist for RLE into bed with cues for adjusting hips once in bed.  Transfers Transfers: Sit to Stand;Stand to Sit Sit to Stand: 4: Min assist;With upper extremity assist;With armrests;From chair/3-in-1 Stand to Sit: 4: Min assist;With upper extremity assist;To bed Details for Transfer Assistance: Assist to rise, steady and ensure controlled descent with min cues for maintaining TDWB status and hand placement.   Ambulation/Gait Ambulation/Gait Assistance: 4: Min assist;3: Mod assist Ambulation Distance (Feet): 18 Feet Assistive device: Rolling walker Ambulation/Gait Assistance Details: Pt doing much better this afternoon maintaining TDWB and able to ambulate into hallway with cues for sequencing/technique.  Gait Pattern: Step-to  pattern;Antalgic;Trunk flexed Gait velocity: very slow Corporate treasurer: Yes Wheelchair Assistance: 4: Barrister's clerk: Both upper extremities;Left lower extremity Wheelchair Parts Management: Needs assistance Distance: 200    Exercises     PT Diagnosis:    PT Problem List:   PT Treatment Interventions:     PT Goals Acute Rehab PT Goals PT Goal Formulation: With patient Time For Goal Achievement: 10/10/12 Potential to Achieve Goals: Good Pt will go Sit to Supine/Side: with supervision PT Goal: Sit to Supine/Side - Progress: Progressing toward goal Pt will go Sit to Stand: with supervision PT Goal: Sit to Stand - Progress: Updated due to goal met Pt will go Stand to Sit: with supervision PT Goal: Stand to Sit - Progress: Updated due to goals met Pt will Ambulate: 16 - 50 feet;with least restrictive assistive device;with min assist PT Goal: Ambulate - Progress: Progressing toward goal Additional Goals Additional Goal #1: Pt will be able to maintain TDWB status throughout all mobility.  PT Goal: Additional Goal #1 - Progress: Progressing toward goal  Visit Information  Last PT Received On: 10/03/12 Assistance Needed: +1    Subjective Data  Subjective: I can manage at home.  Patient Stated Goal: to return home if possible   Cognition  Cognition Arousal/Alertness: Awake/alert Behavior During Therapy: WFL for tasks assessed/performed Overall Cognitive Status: Within Functional Limits for tasks assessed    Balance     End of Session PT - End of Session Equipment Utilized During Treatment: Gait belt Activity Tolerance: Patient limited by pain;Patient limited by fatigue Patient left: in bed;with call bell/phone within reach;with family/visitor present Nurse Communication: Mobility status   GP     Vista Deck 10/03/2012, 3:44  PM   

## 2012-10-03 NOTE — Progress Notes (Signed)
CSW received consult from PT, Irving Burton that CIR was recommended though patient has [employer] Medicare Complete which might not cover for CIR. CSW spoke with patient & wife at bedside re: backup plan. Patient states that he'd rather return home with his wife than go to SNF. CSW made RNCM, Bonita Quin aware as patient was requesting wheelchair & rolling walker at discharge. CSW signing off.   Unice Bailey, LCSW [facility] Clinical Social Worker cell #: [phone]

## 2012-10-04 LAB — PROTIME-INR
INR: 1.04 (ref 0.00–1.49)
Prothrombin Time: 13.5 s (ref 11.6–15.2)

## 2012-10-04 LAB — CBC
HCT: 39.8 % (ref 39.0–52.0)
Hemoglobin: 13.4 g/dL (ref 13.0–17.0)
MCH: 29.8 pg (ref 26.0–34.0)
MCHC: 33.7 g/dL (ref 30.0–36.0)
MCV: 88.4 fL (ref 78.0–100.0)

## 2012-10-04 LAB — GLUCOSE, CAPILLARY
Glucose-Capillary: 258 mg/dL — ABNORMAL HIGH (ref 70–99)
Glucose-Capillary: 234 mg/dL — ABNORMAL HIGH (ref 70–99)

## 2012-10-04 MED ORDER — WARFARIN SODIUM 7.5 MG PO TABS: 7.5000 mg | ORAL_TABLET | Freq: Every day | ORAL | Status: DC

## 2012-10-04 MED ORDER — WARFARIN SODIUM 7.5 MG PO TABS: 7.5000 mg | ORAL_TABLET | Freq: Once | ORAL | Status: DC

## 2012-10-04 MED ORDER — WARFARIN SODIUM 7.5 MG PO TABS
7.5000 mg | ORAL_TABLET | Freq: Once | ORAL | Status: AC
  Administered 2012-10-04: 7.5 mg via ORAL
  Filled 2012-10-04: qty 1

## 2012-10-04 NOTE — Progress Notes (Signed)
Physical Therapy Treatment Patient Details Name: Andrew Larsen MRN: [id number] DOB: 04-Sep-1940 Today's Date: 10/04/2012 Time: 4098-1191 PT Time Calculation (min): 18 min  PT Assessment / Plan / Recommendation Comments on Treatment Session  Pt progressing very well with mobility.  Educated on exercises and also demonstrated how to bump up in w/c up the stairs at daughters home.  All family members verbalize understanding.     Follow Up Recommendations  Home health PT;Supervision/Assistance - 24 hour     Does the patient have the potential to tolerate intense rehabilitation     Barriers to Discharge        Equipment Recommendations  Rolling walker with 5" wheels;Wheelchair (measurements PT);Wheelchair cushion (measurements PT)    Recommendations for Other Services    Frequency Min 4X/week   Plan Discharge plan remains appropriate    Precautions / Restrictions Precautions Precautions: Fall Restrictions Weight Bearing Restrictions: Yes RLE Weight Bearing: Touchdown weight bearing   Pertinent Vitals/Pain 7/10 pain, RN aware and premedicated.     Mobility  Bed Mobility Bed Mobility: Supine to Sit Supine to Sit: 4: Min assist Details for Bed Mobility Assistance: Assist for RLE out of bed with cues for technique and hand placement on bed.  Transfers Transfers: Sit to Stand;Stand to Sit Sit to Stand: 4: Min guard;4: Min assist;From elevated surface;With upper extremity assist;From bed Stand to Sit: 4: Min guard;4: Min assist;With upper extremity assist;With armrests;To chair/3-in-1 Details for Transfer Assistance: Performed x 2 to w/c and to recliner.  Cues for hand placement and maintaining TDWB Ambulation/Gait Ambulation/Gait Assistance: 4: Min assist Ambulation Distance (Feet): 28 Feet Assistive device: Rolling walker Ambulation/Gait Assistance Details: Cues for sequencing/technique with RW and to maintain upright posture  Gait Pattern: Step-to pattern;Antalgic;Trunk  flexed Gait velocity: very slow Corporate treasurer: Yes Wheelchair Assistance: 5: Investment banker, operational: Both upper extremities;Left lower extremity Wheelchair Parts Management: Needs assistance Distance: 250    Exercises General Exercises - Lower Extremity Quad Sets: Strengthening;Right;10 reps Hip ABduction/ADduction: AAROM;Right;10 reps Straight Leg Raises: AAROM;Right;10 reps   PT Diagnosis:    PT Problem List:   PT Treatment Interventions:     PT Goals Acute Rehab PT Goals PT Goal Formulation: With patient Time For Goal Achievement: 10/10/12 Potential to Achieve Goals: Good Pt will go Supine/Side to Sit: with supervision PT Goal: Supine/Side to Sit - Progress: Progressing toward goal Pt will go Sit to Stand: with supervision PT Goal: Sit to Stand - Progress: Progressing toward goal Pt will go Stand to Sit: with supervision PT Goal: Stand to Sit - Progress: Progressing toward goal Pt will Ambulate: 16 - 50 feet;with least restrictive assistive device;with min assist PT Goal: Ambulate - Progress: Met Additional Goals Additional Goal #1: Pt will be able to maintain TDWB status throughout all mobility.  PT Goal: Additional Goal #1 - Progress: Met  Visit Information  Last PT Received On: 10/04/12 Assistance Needed: +1    Subjective Data  Subjective: I'm ready to get home.  Patient Stated Goal: to return home if possible   Cognition  Cognition Arousal/Alertness: Awake/alert Behavior During Therapy: WFL for tasks assessed/performed Overall Cognitive Status: Within Functional Limits for tasks assessed    Balance     End of Session PT - End of Session Equipment Utilized During Treatment: Gait belt Activity Tolerance: Patient limited by pain;Patient limited by fatigue Patient left: in chair;with call bell/phone within reach;with family/visitor present Nurse Communication: Mobility status   GP     Vista Deck 10/04/2012,  11:06 AM

## 2012-10-04 NOTE — Progress Notes (Signed)
Pt stable, scripts, and d/c instructions given with no questions/concerns voiced by pt or family.  Pt transported via wheelchair to private vehicle with NT and family.

## 2012-10-04 NOTE — Progress Notes (Addendum)
Subjective: 2 Days Post-Op Procedure(s) (LRB): OPEN REDUCTION INTERNAL FIXATION (ORIF) ANKLE FRACTURE (Right) Awake alert and oriented x4. Baseline peripheral neuropathy both lower extremities. Right short leg splint in good condition. Pain is mild. He has been able to walk with physical therapy half of the hallway and using a wheelchair for the rest. I suggested he may wish to try a Strool about a stroller for the right lower extremity. Patient reports pain as mild.    Objective:   VITALS:  Temp:  [97.7 F (36.5 C)-98.5 F (36.9 C)] 97.7 F (36.5 C) (06/07 0555) Pulse Rate:  [78-86] 86 (06/07 0555) Resp:  [16] 16 (06/07 0555) BP: (121-148)/(69-80) 135/76 mmHg (06/07 0555) SpO2:  [94 %-100 %] 94 % (06/07 0555)  Neurologically intact ABD soft Neurovascular intact Sensation intact distally Intact pulses distally Dorsiflexion/Plantar flexion intact Incision: dressing C/D/I   LABS  Recent Labs  10/02/12 1017 10/04/12 0452  HGB 15.3 13.4  WBC 7.2 7.8  PLT 215 182    Recent Labs  10/02/12 1017  NA 133*  K 4.4  CL 95*  CO2 29  BUN 17  CREATININE 1.25  GLUCOSE 277*    Recent Labs  10/03/12 0445 10/04/12 0452  INR 0.96 1.04     Assessment/Plan: 2 Days Post-Op Procedure(s) (LRB): OPEN REDUCTION INTERNAL FIXATION (ORIF) ANKLE FRACTURE (Right)  Advance diet Up with therapy Discharge home with home health Rxs given to wife and prints of xrays of the ORIF right ankle.  NITKA,JAMES E 10/04/2012, 8:51 AM

## 2012-10-04 NOTE — Progress Notes (Signed)
Pt wheelchair (equipment) delivered to room.

## 2012-10-04 NOTE — Progress Notes (Addendum)
ANTICOAGULATION CONSULT NOTE - Follow Up Consult  Pharmacy Consult for Coumadin Indication: VTE prophylaxis  Not on File  Patient Measurements: Height: 5\' 11"  (180.3 cm) Weight: 250 lb (113.399 kg) IBW/kg (Calculated) : 75.3  Vital Signs: Temp: 97.7 F (36.5 C) (06/07 0555) Temp src: Oral (06/07 0555) BP: 135/76 mmHg (06/07 0555) Pulse Rate: 86 (06/07 0555)  Labs:  Recent Labs  10/02/12 1017 10/03/12 0445 10/04/12 0452  HGB 15.3  --  13.4  HCT 43.4  --  39.8  PLT 215  --  182  LABPROT  --  12.7 13.5  INR  --  0.96 1.04  CREATININE 1.25  --   --     Estimated Creatinine Clearance: 70.4 ml/min (by C-G formula based on Cr of 1.25).   Assessment: 72 yo male w/ right ankle fracture s/p ORIF on 6/6. Warfarin per Rx for VTE prophylaxis started 6/6. DDI: On ASA 81, saw palmetto, fish-oil PTA.  Coumadin score = 7  INR 1.04 today, CBC wnl.  Plan discharge today on Coumadin 5mg  daily  Coumadin education completed 6/6   Goal of Therapy:  INR 2-3 Monitor platelets by anticoagulation protocol: Yes   Plan:   Will give Coumadin 7.5mg  at noon today since last dose was at 0800 yesterday.    MD: Consider discharging patient on Coumadin 7.5mg  daily with close PT/INR follow up.  Recommend discontinuing PTA Saw Palmetto given drug-drug interaction with Coumadin.  Please advise patient to not take Coumadin later today if discharge since dose given early in hospital as above (RN notified)   Pharmacy will f/u  Geoffry Paradise, PharmD, BCPS Pager: [phone] 8:31 AM Pharmacy #: [phone]

## 2012-10-07 ENCOUNTER — Encounter (HOSPITAL_COMMUNITY): Payer: Self-pay | Admitting: Orthopedic Surgery

## 2012-10-08 NOTE — Progress Notes (Signed)
10/03/12 1139  PT G-Codes **NOT FOR INPATIENT CLASS**  Functional Assessment Tool Used clincial judgement based of documentation from Harriet Butte, PT  Functional Limitation Mobility: Walking and moving around  Mobility: Walking and Moving Around Current Status [phone]) CK  Mobility: Walking and Moving Around Goal Status (U0454) CI  PT General Charges  $$ ACUTE PT VISIT 1 Procedure  PT Evaluation  $Initial PT Evaluation Tier I 1 Procedure  PT Treatments  $Therapeutic Activity 8-22 mins

## 2012-10-13 NOTE — Anesthesia Postprocedure Evaluation (Signed)
Anesthesia Post Note  Patient: Andrew Larsen  Procedure(s) Performed: Procedure(s) (LRB): OPEN REDUCTION INTERNAL FIXATION (ORIF) ANKLE FRACTURE (Right)  Anesthesia type: General  Patient location: PACU  Post pain: Pain level controlled  Post assessment: Post-op Vital signs reviewed  Last Vitals:  Filed Vitals:   10/04/12 1415  BP: 145/74  Pulse: 83  Temp: 36.6 C  Resp: 16    Post vital signs: Reviewed  Level of consciousness: sedated  Complications: No apparent anesthesia complications

## 2012-10-21 NOTE — Discharge Summary (Signed)
Physician Discharge Summary  Patient ID: Andrew Larsen MRN: [id number] DOB/AGE: 12-12-1940 72 y.o.  Admit date: 10/02/2012 Discharge date: 70/2014  Admission Diagnoses:  Right ankle fracture  Discharge Diagnoses:  Same  Surgeries: Procedure(s): OPEN REDUCTION INTERNAL FIXATION (ORIF) ANKLE FRACTURE on 10/02/2012 - 10/03/2012   Consultants:    Discharged Condition: Stable  Hospital Course: HOBIE KOHLES is an 72 y.o. male who was admitted 10/02/2012 with a chief complaint of  Chief Complaint  Patient presents with  . Ankle Injury  , and found to have a diagnosis of ankle fracture.  They were brought to the operating room on 10/02/2012 - 10/03/2012 and underwent the above named procedures. He was seen by PT and mobilized nwb RLE by the time of DC   Antibiotics given:  Anti-infectives   Start     Dose/Rate Route Frequency Ordered Stop   10/03/12 0400  ceFAZolin (ANCEF) IVPB 2 g/50 mL premix     2 g 100 mL/hr over 30 Minutes Intravenous Every 6 hours 10/03/12 0144 10/03/12 1544    .  Recent vital signs:  Filed Vitals:   10/04/12 1415  BP: 145/74  Pulse: 83  Temp: 97.8 F (36.6 C)  Resp: 16    Recent laboratory studies:  Results for orders placed during the hospital encounter of 10/02/12  SURGICAL PCR SCREEN      Result Value Range   MRSA, PCR NEGATIVE  NEGATIVE   Staphylococcus aureus NEGATIVE  NEGATIVE  CBC WITH DIFFERENTIAL      Result Value Range   WBC 7.2  4.0 - 10.5 K/uL   RBC 4.89  4.22 - 5.81 MIL/uL   Hemoglobin 15.3  13.0 - 17.0 g/dL   HCT 08.6  57.8 - 46.9 %   MCV 88.8  78.0 - 100.0 fL   MCH 31.3  26.0 - 34.0 pg   MCHC 35.3  30.0 - 36.0 g/dL   RDW 62.9  52.8 - 41.3 %   Platelets 215  150 - 400 K/uL   Neutrophils Relative % 56  43 - 77 %   Neutro Abs 4.0  1.7 - 7.7 K/uL   Lymphocytes Relative 33  12 - 46 %   Lymphs Abs 2.4  0.7 - 4.0 K/uL   Monocytes Relative 9  3 - 12 %   Monocytes Absolute 0.7  0.1 - 1.0 K/uL   Eosinophils Relative 2  0 - 5 %   Eosinophils Absolute 0.2  0.0 - 0.7 K/uL   Basophils Relative 0  0 - 1 %   Basophils Absolute 0.0  0.0 - 0.1 K/uL  BASIC METABOLIC PANEL      Result Value Range   Sodium 133 (*) 135 - 145 mEq/L   Potassium 4.4  3.5 - 5.1 mEq/L   Chloride 95 (*) 96 - 112 mEq/L   CO2 29  19 - 32 mEq/L   Glucose, Bld 277 (*) 70 - 99 mg/dL   BUN 17  6 - 23 mg/dL   Creatinine, Ser 2.44  0.50 - 1.35 mg/dL   Calcium 9.5  8.4 - 01.0 mg/dL   GFR calc non Af Amer 57 (*) >90 mL/min   GFR calc Af Amer 66 (*) >90 mL/min  GLUCOSE, CAPILLARY      Result Value Range   Glucose-Capillary 140 (*) 70 - 99 mg/dL  GLUCOSE, CAPILLARY      Result Value Range   Glucose-Capillary 94  70 - 99 mg/dL  GLUCOSE, CAPILLARY  Result Value Range   Glucose-Capillary 88  70 - 99 mg/dL   Comment 1 Call MD NNP PA CNM    GLUCOSE, CAPILLARY      Result Value Range   Glucose-Capillary 131 (*) 70 - 99 mg/dL  GLUCOSE, CAPILLARY      Result Value Range   Glucose-Capillary 132 (*) 70 - 99 mg/dL  PROTIME-INR      Result Value Range   Prothrombin Time 12.7  11.6 - 15.2 seconds   INR 0.96  0.00 - 1.49  GLUCOSE, CAPILLARY      Result Value Range   Glucose-Capillary 179 (*) 70 - 99 mg/dL   Comment 1 Notify RN     Comment 2 Documented in Chart    GLUCOSE, CAPILLARY      Result Value Range   Glucose-Capillary 189 (*) 70 - 99 mg/dL  GLUCOSE, CAPILLARY      Result Value Range   Glucose-Capillary 189 (*) 70 - 99 mg/dL  PROTIME-INR      Result Value Range   Prothrombin Time 13.5  11.6 - 15.2 seconds   INR 1.04  0.00 - 1.49  CBC      Result Value Range   WBC 7.8  4.0 - 10.5 K/uL   RBC 4.50  4.22 - 5.81 MIL/uL   Hemoglobin 13.4  13.0 - 17.0 g/dL   HCT 96.0  45.4 - 09.8 %   MCV 88.4  78.0 - 100.0 fL   MCH 29.8  26.0 - 34.0 pg   MCHC 33.7  30.0 - 36.0 g/dL   RDW 11.9  14.7 - 82.9 %   Platelets 182  150 - 400 K/uL  GLUCOSE, CAPILLARY      Result Value Range   Glucose-Capillary 180 (*) 70 - 99 mg/dL  GLUCOSE, CAPILLARY       Result Value Range   Glucose-Capillary 250 (*) 70 - 99 mg/dL  GLUCOSE, CAPILLARY      Result Value Range   Glucose-Capillary 258 (*) 70 - 99 mg/dL  GLUCOSE, CAPILLARY      Result Value Range   Glucose-Capillary 234 (*) 70 - 99 mg/dL    Discharge Medications:     Medication List    STOP taking these medications       aspirin EC 81 MG tablet     fish oil-omega-3 fatty acids 1000 MG capsule     SAW PALMETTO PO      TAKE these medications       Ascorbic Acid 500 MG Caps  Take 2 capsules by mouth 2 (two) times daily.     CENTRUM SILVER ADULT 50+ PO  Take 1 tablet by mouth every morning.     COMBIGAN 0.2-0.5 % ophthalmic solution  Generic drug:  brimonidine-timolol  Place 1 drop into both eyes 2 (two) times daily.     gabapentin 300 MG capsule  Commonly known as:  NEURONTIN  Take 300 mg by mouth 3 (three) times daily.     insulin lispro protamine-lispro (75-25) 100 UNIT/ML Susp  Commonly known as:  HUMALOG 75/25  Inject 44-49 Units into the skin 2 (two) times daily. 44 units in the morning, 49 units in the evening     latanoprost 0.005 % ophthalmic solution  Commonly known as:  XALATAN  Place 1 drop into both eyes at bedtime.     losartan 50 MG tablet  Commonly known as:  COZAAR  Take 50 mg by mouth every morning.     omeprazole  40 MG capsule  Commonly known as:  PRILOSEC  Take 40 mg by mouth every morning.     oxyCODONE 5 MG immediate release tablet  Commonly known as:  Oxy IR/ROXICODONE  Take 1-2 tablets (5-10 mg total) by mouth every 3 (three) hours as needed.     triamterene-hydrochlorothiazide 37.5-25 MG per capsule  Commonly known as:  DYAZIDE  Take 1 capsule by mouth every morning.     warfarin 7.5 MG tablet  Commonly known as:  COUMADIN  Take 1 tablet (7.5 mg total) by mouth once.     warfarin 7.5 MG tablet  Commonly known as:  COUMADIN  Take 1 tablet (7.5 mg total) by mouth daily.        Diagnostic Studies: Dg Chest 2 View  10/02/2012    *RADIOLOGY REPORT*  Clinical Data: Fall.  Preop right ankle surgery.Diabetic.  Prior smoker.  CHEST - 2 VIEW  Comparison: None.  Findings: Heart and mediastinal contours are within normal limits. No focal opacities or effusions.  No acute bony abnormality.  IMPRESSION: No active cardiopulmonary disease.   Original Report Authenticated By: Charlett Nose, M.D.   Dg Ankle 2 Views Right  10/02/2012   *RADIOLOGY REPORT*  Clinical Data: Right ankle pain post fall  RIGHT ANKLE - 2 VIEW  Comparison: 08/31/2004  Findings: Two views of the right ankle submitted.  There is oblique displaced fracture of distal fibula.  Mild displaced fracture of distal tibia medial malleolus.  Disruption of the ankle mortise with mild medial displacement of the distal tibia and fibula. There is soft tissue swelling adjacent to medial and lateral malleolus.  IMPRESSION:  There is oblique displaced fracture of distal fibula.  Mild displaced fracture of distal tibia medial malleolus.  Disruption of the ankle mortise with mild medial displacement of the distal tibia and fibula.  There is soft tissue swelling adjacent to medial and lateral malleolus.   Original Report Authenticated By: Natasha Mead, M.D.   Dg Ankle Complete Right  10/02/2012   *RADIOLOGY REPORT*  Clinical Data: ORIF right ankle fracture  DG C-ARM 1-60 MIN - NRPT [facility],RIGHT ANKLE - COMPLETE 3+ VIEW  Comparison: 10/02/2012  Findings: Three intraoperative fluoroscopic images demonstrate side plate and screw fixation of the previously seen oblique distal fibular fracture and screw fixation of the medial malleolar fracture.  Fracture fragments are in near anatomic alignment. Mortise is symmetric.  No evidence for hardware failure.  IMPRESSION: Expected intraoperative appearance after reduction of the distal fibular and tibial fractures as above.   Original Report Authenticated By: Christiana Pellant, M.D.   Dg Ankle Right Port  10/03/2012   *RADIOLOGY REPORT*  Clinical Data: Postoperative  radiograph, status post internal fixation of right ankle fracture.  PORTABLE RIGHT ANKLE - 2 VIEW  Comparison: Right ankle radiographs performed 10/02/2012  Findings: There has been successful placement of plate and screws transfixing the fractures through the distal tibia and fibula in grossly anatomic alignment.  No new fractures are seen.  The ankle mortise is grossly unremarkable in appearance.  Evaluation is suboptimal due to the overlying cast.  IMPRESSION: Successful placement of plate and screws transfixing the fractures through the distal tibia and fibula in grossly anatomic alignment.   Original Report Authenticated By: Tonia Ghent, M.D.   Dg C-arm 1-60 Min-no Report  10/02/2012   *RADIOLOGY REPORT*  Clinical Data: ORIF right ankle fracture  DG C-ARM 1-60 MIN - [facility],RIGHT ANKLE - COMPLETE 3+ VIEW  Comparison: 10/02/2012  Findings: Three intraoperative fluoroscopic  images demonstrate side plate and screw fixation of the previously seen oblique distal fibular fracture and screw fixation of the medial malleolar fracture.  Fracture fragments are in near anatomic alignment. Mortise is symmetric.  No evidence for hardware failure.  IMPRESSION: Expected intraoperative appearance after reduction of the distal fibular and tibial fractures as above.   Original Report Authenticated By: Christiana Pellant, M.D.    Disposition: 01-Home or Self Care      Discharge Orders   Future Orders Complete By Expires     Call MD / Call 911  As directed     Comments:      If you experience chest pain or shortness of breath, CALL 911 and be transported to the hospital emergency room.  If you develope a fever above 101 F, pus (white drainage) or increased drainage or redness at the wound, or calf pain, call your surgeon's office.    Call MD / Call 911  As directed     Comments:      If you experience chest pain or shortness of breath, CALL 911 and be transported to the hospital emergency room.  If you develope a  fever above 101 F, pus (white drainage) or increased drainage or redness at the wound, or calf pain, call your surgeon's office.    Constipation Prevention  As directed     Comments:      Drink plenty of fluids.  Prune juice may be helpful.  You may use a stool softener, such as Colace (over the counter) 100 mg twice a day.  Use MiraLax (over the counter) for constipation as needed.    Constipation Prevention  As directed     Comments:      Drink plenty of fluids.  Prune juice may be helpful.  You may use a stool softener, such as Colace (over the counter) 100 mg twice a day.  Use MiraLax (over the counter) for constipation as needed.    Diet - low sodium heart healthy  As directed     Diet - low sodium heart healthy  As directed     Discharge instructions  As directed     Comments:      Elevate foot Touch down weight bearing Keep splint dry    Discharge instructions  As directed     Comments:      Keep short leg splint and dressing dry. May use water impervious bag or cast bag and tub chair to shower Tape the top of bag to skin to avoid moisture soaking the dressing on the leg. Call if there is odor or saturation of dressing or worsening pain not controlled with medications. Call if fever greater than 101.5. Use crutches or walker no weight bearing on the ankle fracture leg. Please follow up with an appointment with Dr. Otelia Sergeant  2 weeks from the time of surgery. Elevate as often as possible during the first week after surgery gradually increasing the time the leg is dependent or down there after. If swelling recurrs then elevate again. Wheel chair for longer distances. Take anticoagulant daily at 6 PM.    Driving restrictions  As directed     Comments:      No driving for at least 6 weeks    Increase activity slowly as tolerated  As directed     Increase activity slowly as tolerated  As directed     Lifting restrictions  As directed     Comments:  No lifting for 6 weeks    Non  weight bearing  As directed     Scheduling Instructions:      Nonweightbearing right lower extremity below the knee.          SignedCammy Copa 10/21/2012, 3:12 PM

## 2014-11-17 ENCOUNTER — Other Ambulatory Visit: Payer: Self-pay | Admitting: Orthopedic Surgery

## 2014-11-17 DIAGNOSIS — R52 Pain, unspecified: Secondary | ICD-10-CM

## 2014-11-17 DIAGNOSIS — R609 Edema, unspecified: Secondary | ICD-10-CM

## 2014-11-18 ENCOUNTER — Ambulatory Visit
Admission: RE | Admit: 2014-11-18 | Discharge: 2014-11-18 | Disposition: A | Discharge status: Home / Self Care | Payer: Medicare Other | Source: Ambulatory Visit | Attending: Orthopedic Surgery | Admitting: Orthopedic Surgery

## 2014-11-18 DIAGNOSIS — R52 Pain, unspecified: Secondary | ICD-10-CM

## 2014-11-18 DIAGNOSIS — R609 Edema, unspecified: Secondary | ICD-10-CM

## 2015-02-23 ENCOUNTER — Encounter: Payer: Self-pay | Admitting: Internal Medicine

## 2015-06-09 ENCOUNTER — Ambulatory Visit (INDEPENDENT_AMBULATORY_CARE_PROVIDER_SITE_OTHER): Payer: Medicare Other | Admitting: Podiatry

## 2015-06-09 ENCOUNTER — Encounter: Payer: Self-pay | Admitting: Podiatry

## 2015-06-09 VITALS — BP 141/87 | HR 74 | Resp 16 | Ht 71.0 in | Wt 260.0 lb

## 2015-06-09 DIAGNOSIS — B351 Tinea unguium: Secondary | ICD-10-CM

## 2015-06-09 DIAGNOSIS — L600 Ingrowing nail: Secondary | ICD-10-CM

## 2015-06-09 DIAGNOSIS — M79604 Pain in right leg: Secondary | ICD-10-CM

## 2015-06-09 DIAGNOSIS — M79674 Pain in right toe(s): Secondary | ICD-10-CM | POA: Diagnosis not present

## 2015-06-09 DIAGNOSIS — M79605 Pain in left leg: Secondary | ICD-10-CM

## 2015-06-09 DIAGNOSIS — M79675 Pain in left toe(s): Secondary | ICD-10-CM | POA: Diagnosis not present

## 2015-06-09 NOTE — Patient Instructions (Signed)

## 2015-06-09 NOTE — Progress Notes (Signed)
   Subjective:    Patient ID: Andrew Larsen, male    DOB: [birth year], 75 y.o.   MRN: [id number]  HPI Patient presents with needing a Bilateral nail trim;   Patient also presents with a nail problem; Right foot, 2nd toe-lateral. Pt stated, "hurts to touch nail"; wants to get checked for ingrown toenail.  Diabetic Type 2; sugar=did not check today; A1C=7.0  Review of Systems  All other systems reviewed and are negative.      Objective:   Physical Exam        Assessment & Plan:

## 2015-06-09 NOTE — Progress Notes (Signed)
Subjective:     Patient ID: Andrew Larsen, male   DOB: April 04, 1941, 75 y.o.   MRN: [id number]  HPI patient presents with wife stating that he has a painful ingrown toenail of his right second toe and his nails are thick and he's a diabetic and he cannot take care of them himself   Review of Systems  All other systems reviewed and are negative.      Objective:   Physical Exam  Constitutional: He is oriented to person, place, and time.  Cardiovascular: Intact distal pulses.   Musculoskeletal: Normal range of motion.  Neurological: He is oriented to person, place, and time.  Skin: Skin is warm.  Nursing note and vitals reviewed.  neurovascular status intact with muscle strength adequate and range of motion within normal limits. Patient's found have a thickened incurvated second nail right lateral side that's painful when pressed and is noted to have nail disease 2 through 5 both feet with thickness and yellow brittle debris formed. He is a diabetic but is under good control with good digital perfusion noted and well oriented 3     Assessment:     Ingrown toenail deformity second nail right foot along with mycotic nail infection 2 through 5 both feet with pain    Plan:     H&P and conditions reviewed. Today recommended removal of the nail corner and he wants the procedure and I infiltrated the right second toe 60 mg Xylocaine Marcaine mixture remove the lateral corner exposed matrix and applied phenol 3 applications 30 seconds followed by alcohol lavage and sterile dressing. Debris did all remaining nails and reappoint for recheck 3 months or earlier if needed

## 2015-06-16 ENCOUNTER — Telehealth: Payer: Self-pay | Admitting: *Deleted

## 2015-06-16 NOTE — Telephone Encounter (Signed)
Called patient at [phone] [facility] #) yesterday, June 15, 2015 to check to see how they were doing from their ingrown toenail procedure that was performed on Thursday, June 09, 2015. Pt stated, "doing good and soaking toe with some relief".

## 2015-07-12 ENCOUNTER — Encounter: Payer: Self-pay | Admitting: Gastroenterology

## 2015-08-23 ENCOUNTER — Ambulatory Visit (AMBULATORY_SURGERY_CENTER): Payer: Self-pay

## 2015-08-23 VITALS — Ht 70.0 in | Wt 260.0 lb

## 2015-08-23 DIAGNOSIS — Z1211 Encounter for screening for malignant neoplasm of colon: Secondary | ICD-10-CM

## 2015-08-23 NOTE — Progress Notes (Signed)
Per pt, no allergies to soy or egg products.Pt not taking any weight loss meds or using  O2 at home. 

## 2015-09-05 ENCOUNTER — Ambulatory Visit (AMBULATORY_SURGERY_CENTER): Payer: Medicare Other | Admitting: Gastroenterology

## 2015-09-05 ENCOUNTER — Encounter: Payer: Self-pay | Admitting: Gastroenterology

## 2015-09-05 VITALS — BP 129/72 | HR 79 | Temp 98.0°F | Resp 15 | Ht 70.0 in | Wt 260.0 lb

## 2015-09-05 DIAGNOSIS — Z1211 Encounter for screening for malignant neoplasm of colon: Secondary | ICD-10-CM

## 2015-09-05 MED ORDER — SODIUM CHLORIDE 0.9 % IV SOLN: 500.0000 mL | INTRAVENOUS | Status: DC

## 2015-09-05 NOTE — Op Note (Addendum)
[place] Patient Name: Andrew Larsen Procedure Date: 09/05/2015 9:19 AM MRN: [id number] Endoscopist: Mallie Mussel L. Loletha Carrow , MD Age: 75 Date of Birth: 1940/07/30 Gender: Male Procedure:                Colonoscopy Indications:              Screening for colorectal malignant neoplasm, This                            is the patient's first colonoscopy Medicines:                Monitored Anesthesia Care Procedure:                Pre-Anesthesia Assessment:                           - Prior to the procedure, a History and Physical                            was performed, and patient medications and                            allergies were reviewed. The patient's tolerance of                            previous anesthesia was also reviewed. The risks                            and benefits of the procedure and the sedation                            options and risks were discussed with the patient.                            All questions were answered, and informed consent                            was obtained. Prior Anticoagulants: The patient has                            taken no previous anticoagulant or antiplatelet                            agents. ASA Grade Assessment: II - A patient with                            mild systemic disease. After reviewing the risks                            and benefits, the patient was deemed in                            satisfactory condition to undergo the procedure.  After obtaining informed consent, the colonoscope                            was passed under direct vision. Throughout the                            procedure, the patient's blood pressure, pulse, and                            oxygen saturations were monitored continuously. The                            Model CF-HQ190L [id number]) scope was introduced                            through the anus and advanced to the the cecum,            identified by appendiceal orifice and ileocecal                            valve. The colonoscopy was somewhat difficult due                            to significant looping. Successful completion of                            the procedure was aided by changing the patient to                            a supine position. The patient tolerated the                            procedure well. The quality of the bowel                            preparation was good. The ileocecal valve,                            appendiceal orifice, and rectum were photographed.                            The bowel preparation used was Miralax. Scope In: 9:34:07 AM Scope Out: 9:56:36 AM Scope Withdrawal Time: 0 hours 7 minutes 44 seconds  Total Procedure Duration: 0 hours 22 minutes 29 seconds  Findings:                 The perianal and digital rectal examinations were                            normal.                           Multiple medium-mouthed diverticula were found in  the left colon and right colon.                           Internal hemorrhoids were found during                            retroflexion. The hemorrhoids were small and Grade                            I (internal hemorrhoids that do not prolapse).                           The exam was otherwise without abnormality. Complications:            No immediate complications. Estimated Blood Loss:     Estimated blood loss: none. Impression:               - Diverticulosis in the left colon and in the right                            colon.                           - Internal hemorrhoids.                           - The examination was otherwise normal.                           - No specimens collected. Recommendation:           - Patient has a contact number available for                            emergencies. The signs and symptoms of potential                            delayed complications were  discussed with the                            patient. Return to normal activities tomorrow.                            Written discharge instructions were provided to the                            patient.                           - Resume previous diet.                           - Continue present medications.                           - Repeat colonoscopy in 10 years for screening  purposes. Henry L. Loletha Carrow, MD 09/05/2015 10:02:44 AM This report has been signed electronically. Addendum Number: 1   Addendum Date: 09/05/2015 10:10:13 AM      Change in recommendations:      Due to patient's age, no future routine colonoscopy is necessary. Henry L. Loletha Carrow, MD 09/05/2015 10:11:46 AM This report has been signed electronically.

## 2015-09-05 NOTE — Progress Notes (Signed)
To pacu vss patent aw report to rn 

## 2015-09-05 NOTE — Patient Instructions (Signed)
YOU HAD AN ENDOSCOPIC PROCEDURE TODAY AT THE [facility]:   Refer to the procedure report that was given to you for any specific questions about what was found during the examination.  If the procedure report does not answer your questions, please call your gastroenterologist to clarify.  If you requested that your care partner not be given the details of your procedure findings, then the procedure report has been included in a sealed envelope for you to review at your convenience later.  YOU SHOULD EXPECT: Some feelings of bloating in the abdomen. Passage of more gas than usual.  Walking can help get rid of the air that was put into your GI tract during the procedure and reduce the bloating. If you had a lower endoscopy (such as a colonoscopy or flexible sigmoidoscopy) you may notice spotting of blood in your stool or on the toilet paper. If you underwent a bowel prep for your procedure, you may not have a normal bowel movement for a few days.  Please Note:  You might notice some irritation and congestion in your nose or some drainage.  This is from the oxygen used during your procedure.  There is no need for concern and it should clear up in a day or so.  SYMPTOMS TO REPORT IMMEDIATELY:   Following lower endoscopy (colonoscopy or flexible sigmoidoscopy):  Excessive amounts of blood in the stool  Significant tenderness or worsening of abdominal pains  Swelling of the abdomen that is new, acute  Fever of 100F or higher  For urgent or emergent issues, a gastroenterologist can be reached at any hour by calling [phone].   DIET: Your first meal following the procedure should be a small meal and then it is ok to progress to your normal diet. Heavy or fried foods are harder to digest and may make you feel nauseous or bloated.  Likewise, meals heavy in dairy and vegetables can increase bloating.  Drink plenty of fluids but you should avoid alcoholic beverages for 24  hours.  ACTIVITY:  You should plan to take it easy for the rest of today and you should NOT DRIVE or use heavy machinery until tomorrow (because of the sedation medicines used during the test).    FOLLOW UP: Our staff will call the number listed on your records the next business day following your procedure to check on you and address any questions or concerns that you may have regarding the information given to you following your procedure. If we do not reach you, we will leave a message.  However, if you are feeling well and you are not experiencing any problems, there is no need to return our call.  We will assume that you have returned to your regular daily activities without incident.  If any biopsies were taken you will be contacted by phone or by letter within the next 1-3 weeks.  Please call us at [phone] if you have not heard about the biopsies in 3 weeks.    SIGNATURES/CONFIDENTIALITY: You and/or your care partner have signed paperwork which will be entered into your electronic medical record.  These signatures attest to the fact that that the information above on your After Visit Summary has been reviewed and is understood.  Full responsibility of the confidentiality of this discharge information lies with you and/or your care-partner.  Thank you for letting us take care of your healthcare needs today. 

## 2015-09-06 ENCOUNTER — Telehealth: Payer: Self-pay | Admitting: *Deleted

## 2015-09-06 NOTE — Telephone Encounter (Signed)
  Follow up Call-  Call back number 09/05/2015  Post procedure Call Back phone  # [phone]  Permission to leave phone message Yes     Patient questions:  Do you have a fever, pain , or abdominal swelling? No. Pain Score  0 *  Have you tolerated food without any problems? Yes.    Have you been able to return to your normal activities? Yes.    Do you have any questions about your discharge instructions: Diet   No. Medications  No. Follow up visit  No.  Do you have questions or concerns about your Care? No.  Actions: * If pain score is 4 or above: No action needed, pain <4.

## 2015-09-14 ENCOUNTER — Telehealth: Payer: Self-pay

## 2015-09-14 ENCOUNTER — Ambulatory Visit: Payer: Medicare Other | Admitting: Neurology

## 2015-09-14 NOTE — Telephone Encounter (Signed)
Patient did not come to new appt

## 2015-09-15 ENCOUNTER — Encounter: Payer: Self-pay | Admitting: Neurology

## 2015-09-16 ENCOUNTER — Ambulatory Visit: Payer: Medicare Other | Admitting: Podiatry

## 2015-09-16 ENCOUNTER — Telehealth: Payer: Self-pay | Admitting: Internal Medicine

## 2015-09-16 NOTE — Telephone Encounter (Signed)
Spoke with pt and he states that he is living in an apartment that was found to have mold in the walls. It was suggested by the environmental specialist that he be seen by pulmonary for evaluation. Pt c/o cough, congestion, wheeze and SOB. Pt is being seen and treated by his PCP in the interim. Pt wanted sooner consult. Sooner appt made for pt with MW. Pt advised to seek care with PCP, UC or ED if symptoms worsen before appt. Nothing further needed.

## 2015-09-19 ENCOUNTER — Emergency Department (HOSPITAL_BASED_OUTPATIENT_CLINIC_OR_DEPARTMENT_OTHER): Payer: Medicare Other

## 2015-09-19 ENCOUNTER — Emergency Department (HOSPITAL_BASED_OUTPATIENT_CLINIC_OR_DEPARTMENT_OTHER)
Admission: EM | Admit: 2015-09-19 | Discharge: 2015-09-19 | Disposition: A | Discharge status: Home / Self Care | Payer: Medicare Other | Attending: Emergency Medicine | Admitting: Emergency Medicine

## 2015-09-19 ENCOUNTER — Encounter (HOSPITAL_BASED_OUTPATIENT_CLINIC_OR_DEPARTMENT_OTHER): Payer: Self-pay | Admitting: *Deleted

## 2015-09-19 DIAGNOSIS — R0600 Dyspnea, unspecified: Secondary | ICD-10-CM

## 2015-09-19 DIAGNOSIS — E119 Type 2 diabetes mellitus without complications: Secondary | ICD-10-CM | POA: Insufficient documentation

## 2015-09-19 DIAGNOSIS — R600 Localized edema: Secondary | ICD-10-CM | POA: Diagnosis not present

## 2015-09-19 DIAGNOSIS — Z79899 Other long term (current) drug therapy: Secondary | ICD-10-CM | POA: Diagnosis not present

## 2015-09-19 DIAGNOSIS — J9801 Acute bronchospasm: Secondary | ICD-10-CM | POA: Diagnosis not present

## 2015-09-19 DIAGNOSIS — Z7982 Long term (current) use of aspirin: Secondary | ICD-10-CM | POA: Diagnosis not present

## 2015-09-19 DIAGNOSIS — I10 Essential (primary) hypertension: Secondary | ICD-10-CM | POA: Insufficient documentation

## 2015-09-19 DIAGNOSIS — J40 Bronchitis, not specified as acute or chronic: Secondary | ICD-10-CM | POA: Insufficient documentation

## 2015-09-19 DIAGNOSIS — R05 Cough: Secondary | ICD-10-CM | POA: Diagnosis present

## 2015-09-19 DIAGNOSIS — Z87891 Personal history of nicotine dependence: Secondary | ICD-10-CM | POA: Diagnosis not present

## 2015-09-19 DIAGNOSIS — Z794 Long term (current) use of insulin: Secondary | ICD-10-CM | POA: Diagnosis not present

## 2015-09-19 HISTORY — DX: Pure hypercholesterolemia, unspecified: E78.00

## 2015-09-19 LAB — BASIC METABOLIC PANEL
Anion gap: 8 (ref 5–15)
BUN: 18 mg/dL (ref 6–20)
CALCIUM: 9.1 mg/dL (ref 8.9–10.3)
CO2: 27 mmol/L (ref 22–32)
CREATININE: 1.36 mg/dL — AB (ref 0.61–1.24)
Chloride: 101 mmol/L (ref 101–111)
GFR calc Af Amer: 58 mL/min — ABNORMAL LOW (ref >60)
GFR, EST NON AFRICAN AMERICAN: 50 mL/min — AB (ref >60)
GLUCOSE: 170 mg/dL — AB (ref 65–99)
Potassium: 4.2 mmol/L (ref 3.5–5.1)
SODIUM: 136 mmol/L (ref 135–145)

## 2015-09-19 LAB — BRAIN NATRIURETIC PEPTIDE: B Natriuretic Peptide: 15.9 pg/mL (ref 0.0–100.0)

## 2015-09-19 LAB — TROPONIN I: Troponin I: <0.03 ng/mL (ref <0.031)

## 2015-09-19 LAB — CBC
HEMATOCRIT: 40 % (ref 39.0–52.0)
HEMOGLOBIN: 13.7 g/dL (ref 13.0–17.0)
MCH: 31.7 pg (ref 26.0–34.0)
MCHC: 34.3 g/dL (ref 30.0–36.0)
MCV: 92.6 fL (ref 78.0–100.0)
Platelets: 185 10*3/uL (ref 150–400)
RBC: 4.32 MIL/uL (ref 4.22–5.81)
RDW: 13.5 % (ref 11.5–15.5)
WBC: 6.5 10*3/uL (ref 4.0–10.5)

## 2015-09-19 MED ORDER — AZITHROMYCIN 250 MG PO TABS: 250.0000 mg | ORAL_TABLET | Freq: Every day | ORAL | Status: DC

## 2015-09-19 MED ORDER — IOPAMIDOL (ISOVUE-370) INJECTION 76%
100.0000 mL | Freq: Once | INTRAVENOUS | Status: AC | PRN
  Administered 2015-09-19: 100 mL via INTRAVENOUS

## 2015-09-19 MED ORDER — ALBUTEROL SULFATE HFA 108 (90 BASE) MCG/ACT IN AERS: 2.0000 | INHALATION_SPRAY | RESPIRATORY_TRACT | Status: DC | PRN

## 2015-09-19 MED ORDER — ALBUTEROL SULFATE (2.5 MG/3ML) 0.083% IN NEBU
5.0000 mg | INHALATION_SOLUTION | Freq: Once | RESPIRATORY_TRACT | Status: AC
  Administered 2015-09-19: 5 mg via RESPIRATORY_TRACT
  Filled 2015-09-19: qty 6

## 2015-09-19 MED FILL — AZITHROMYCIN 250 MG TABLET: 250 | 5 days supply | Qty: 6 | Fill #0

## 2015-09-19 MED FILL — PROAIR HFA 90 MCG INHALER: 108 (90 BAS | 25 days supply | Qty: 9 | Fill #0

## 2015-09-19 NOTE — Discharge Instructions (Signed)
Bronchospasm, Adult A bronchospasm is a spasm or tightening of the airways going into the lungs. During a bronchospasm breathing becomes more difficult because the airways get smaller. When this happens there can be coughing, a whistling sound when breathing (wheezing), and difficulty breathing. Bronchospasm is often associated with asthma, but not all patients who experience a bronchospasm have asthma. CAUSES  A bronchospasm is caused by inflammation or irritation of the airways. The inflammation or irritation may be triggered by:   Allergies (such as to animals, pollen, food, or mold). Allergens that cause bronchospasm may cause wheezing immediately after exposure or many hours later.   Infection. Viral infections are believed to be the most common cause of bronchospasm.   Exercise.   Irritants (such as pollution, cigarette smoke, strong odors, aerosol sprays, and paint fumes).   Weather changes. Winds increase molds and pollens in the air. Rain refreshes the air by washing irritants out. Cold air may cause inflammation.   Stress and emotional upset.  SIGNS AND SYMPTOMS   Wheezing.   Excessive nighttime coughing.   Frequent or severe coughing with a simple cold.   Chest tightness.   Shortness of breath.  DIAGNOSIS  Bronchospasm is usually diagnosed through a history and physical exam. Tests, such as chest X-rays, are sometimes done to look for other conditions. TREATMENT   Inhaled medicines can be given to open up your airways and help you breathe. The medicines can be given using either an inhaler or a nebulizer machine.  Corticosteroid medicines may be given for severe bronchospasm, usually when it is associated with asthma. HOME CARE INSTRUCTIONS   Always have a plan prepared for seeking medical care. Know when to call your health care provider and local emergency services (911 in the [place] Know where you can access local emergency care.  Only take medicines as  directed by your health care provider.  If you were prescribed an inhaler or nebulizer machine, ask your health care provider to explain how to use it correctly. Always use a spacer with your inhaler if you were given one.  It is necessary to remain calm during an attack. Try to relax and breathe more slowly.  Control your home environment in the following ways:   Change your heating and air conditioning filter at least once a month.   Limit your use of fireplaces and wood stoves.  Do not smoke and do not allow smoking in your home.   Avoid exposure to perfumes and fragrances.   Get rid of pests (such as roaches and mice) and their droppings.   Throw away plants if you see mold on them.   Keep your house clean and dust free.   Replace carpet with wood, tile, or vinyl flooring. Carpet can trap dander and dust.   Use allergy-proof pillows, mattress covers, and box spring covers.   Wash bed sheets and blankets every week in hot water and dry them in a dryer.   Use blankets that are made of polyester or cotton.   Wash hands frequently. SEEK MEDICAL CARE IF:   You have muscle aches.   You have chest pain.   The sputum changes from clear or white to yellow, green, gray, or bloody.   The sputum you cough up gets thicker.   There are problems that may be related to the medicine you are given, such as a rash, itching, swelling, or trouble breathing.  SEEK IMMEDIATE MEDICAL CARE IF:   You have worsening wheezing and coughing  even after taking your prescribed medicines.   You have increased difficulty breathing.   You develop severe chest pain. MAKE SURE YOU:   Understand these instructions.  Will watch your condition.  Will get help right away if you are not doing well or get worse.   This information is not intended to replace advice given to you by your health care provider. Make sure you discuss any questions you have with your health care  provider.   Document Released: 04/19/2003 Document Revised: 05/07/2014 Document Reviewed: 10/06/2012 Elsevier Interactive Patient Education 2016 Else[employer] Acute Bronchitis Bronchitis is inflammation of the airways that extend from the windpipe into the lungs (bronchi). The inflammation often causes mucus to develop. This leads to a cough, which is the most common symptom of bronchitis.  In acute bronchitis, the condition usually develops suddenly and goes away over time, usually in a couple weeks. Smoking, allergies, and asthma can make bronchitis worse. Repeated episodes of bronchitis may cause further lung problems.  CAUSES Acute bronchitis is most often caused by the same virus that causes a cold. The virus can spread from person to person (contagious) through coughing, sneezing, and touching contaminated objects. SIGNS AND SYMPTOMS   Cough.   Fever.   Coughing up mucus.   Body aches.   Chest congestion.   Chills.   Shortness of breath.   Sore throat.  DIAGNOSIS  Acute bronchitis is usually diagnosed through a physical exam. Your health care provider will also ask you questions about your medical history. Tests, such as chest X-rays, are sometimes done to rule out other conditions.  TREATMENT  Acute bronchitis usually goes away in a couple weeks. Oftentimes, no medical treatment is necessary. Medicines are sometimes given for relief of fever or cough. Antibiotic medicines are usually not needed but may be prescribed in certain situations. In some cases, an inhaler may be recommended to help reduce shortness of breath and control the cough. A cool mist vaporizer may also be used to help thin bronchial secretions and make it easier to clear the chest.  HOME CARE INSTRUCTIONS  Get plenty of rest.   Drink enough fluids to keep your urine clear or pale yellow (unless you have a medical condition that requires fluid restriction). Increasing fluids may help thin your  respiratory secretions (sputum) and reduce chest congestion, and it will prevent dehydration.   Take medicines only as directed by your health care provider.  If you were prescribed an antibiotic medicine, finish it all even if you start to feel better.  Avoid smoking and secondhand smoke. Exposure to cigarette smoke or irritating chemicals will make bronchitis worse. If you are a smoker, consider using nicotine gum or skin patches to help control withdrawal symptoms. Quitting smoking will help your lungs heal faster.   Reduce the chances of another bout of acute bronchitis by washing your hands frequently, avoiding people with cold symptoms, and trying not to touch your hands to your mouth, nose, or eyes.   Keep all follow-up visits as directed by your health care provider.  SEEK MEDICAL CARE IF: Your symptoms do not improve after 1 week of treatment.  SEEK IMMEDIATE MEDICAL CARE IF:  You develop an increased fever or chills.   You have chest pain.   You have severe shortness of breath.  You have bloody sputum.   You develop dehydration.  You faint or repeatedly feel like you are going to pass out.  You develop repeated vomiting.  You develop a  severe headache. MAKE SURE YOU:   Understand these instructions.  Will watch your condition.  Will get help right away if you are not doing well or get worse.   This information is not intended to replace advice given to you by your health care provider. Make sure you discuss any questions you have with your health care provider.   Document Released: 05/24/2004 Document Revised: 05/07/2014 Document Reviewed: 10/07/2012 Elsevier Interactive Patient Education [employer].

## 2015-09-19 NOTE — ED Provider Notes (Signed)
CSN: [id number]     Arrival date & time 09/19/15  [id number] History   First MD Initiated Contact with Patient 09/19/15 [phone]     Chief Complaint  Patient presents with  . Cough      HPI Patient is a 75 year old male with history of diabetes, hypertension, hyperlipidemia who presents to the emergency department with 4-5 days of productive cough and sinus pressure with fever to 101 yesterday.  He's tried over-the-counter medications without improvement in his symptoms.  He does report some mild dyspnea on exertion.  No history of congestive heart failure.  He does report some mild increased swelling of his bilateral lower extremities over the past 3-4 days.  Denies significant orthopnea.  He tried his wife's albuterol last night with some improvement in his symptoms.   Past Medical History  Diagnosis Date  . Diabetes mellitus without complication ([place])   . Hypertension   . Hypercholesteremia   . Reflux   . Glaucoma    History reviewed. No pertinent past surgical history. History reviewed. No pertinent family history. Social History  Substance Use Topics  . Smoking status: Former Research scientist (life sciences)  . Smokeless tobacco: None  . Alcohol Use: None    Review of Systems  All other systems reviewed and are negative.     Allergies  Review of patient's allergies indicates no known allergies.  Home Medications   Prior to Admission medications   Medication Sig Start Date End Date Taking? Authorizing Provider  Ascorbic Acid (VITAMIN C) 100 MG tablet Take 100 mg by mouth daily.   Yes Historical Provider, MD  aspirin 81 MG tablet Take 81 mg by mouth daily.   Yes Historical Provider, MD  brimonidine-timolol (COMBIGAN) 0.2-0.5 % ophthalmic solution Place 1 drop into both eyes every 12 (twelve) hours.   Yes Historical Provider, MD  Fish Oil-Cholecalciferol (FISH OIL + D3 PO) Take by mouth.   Yes Historical Provider, MD  gabapentin (NEURONTIN) 300 MG capsule Take 300 mg by mouth 2 (two) times daily.   Yes  Historical Provider, MD  insulin lispro protamine-lispro (HUMALOG 75/25 MIX) (75-25) 100 UNIT/ML SUSP injection Inject into the skin. 44 units am, 49 units pm   Yes Historical Provider, MD  latanoprost (XALATAN) 0.005 % ophthalmic solution 1 drop at bedtime.   Yes Historical Provider, MD  losartan (COZAAR) 50 MG tablet Take 50 mg by mouth daily.   Yes Historical Provider, MD  Multiple Vitamins-Minerals (CENTRUM SILVER ADULT 50+ PO) Take by mouth.   Yes Historical Provider, MD  omeprazole (PRILOSEC) 40 MG capsule Take 40 mg by mouth daily.   Yes Historical Provider, MD  Potassium (POTASSIMIN PO) Take by mouth.   Yes Historical Provider, MD  pravastatin (PRAVACHOL) 40 MG tablet Take 40 mg by mouth daily.   Yes Historical Provider, MD  VITAMIN D, ERGOCALCIFEROL, PO Take by mouth.   Yes Historical Provider, MD   BP 161/86 mmHg  Pulse 99  Temp(Src) 98.6 F (37 C) (Oral)  Resp 18  Ht 5\' 11"  (1.803 m)  Wt 260 lb (117.935 kg)  BMI 36.28 kg/m2  SpO2 93% Physical Exam  Constitutional: He is oriented to person, place, and time. He appears well-developed and well-nourished.  HENT:  Head: Normocephalic and atraumatic.  Eyes: EOM are normal.  Neck: Normal range of motion.  Cardiovascular: Normal rate, regular rhythm, normal heart sounds and intact distal pulses.   Pulmonary/Chest: Effort normal and breath sounds normal. No respiratory distress. He has no wheezes. He has no rales.  Abdominal: Soft. He exhibits no distension. There is no tenderness.  Musculoskeletal: Normal range of motion.  1+ edema in his bilateral lower extremities  Neurological: He is alert and oriented to person, place, and time.  Skin: Skin is warm and dry.  Psychiatric: He has a normal mood and affect. Judgment normal.  Nursing note and vitals reviewed.   ED Course  Procedures (including critical care time) Labs Review Labs Reviewed  BASIC METABOLIC PANEL - Abnormal; Notable for the following:    Glucose, Bld 170 (*)     Creatinine, Ser 1.36 (*)    GFR calc non Af Amer 50 (*)    GFR calc Af Amer 58 (*)    All other components within normal limits  CBC  TROPONIN I  BRAIN NATRIURETIC PEPTIDE    Imaging Review Dg Chest 2 View  09/19/2015  CLINICAL DATA:  Cough, fever, sinus pain and nasal congestion. EXAM: CHEST  2 VIEW COMPARISON:  None. FINDINGS: Heart size is upper normal. Mild prominence of the upper mediastinal width. Lungs are clear. No evidence of pneumonia. No pleural effusion or pneumothorax seen. Osseous and soft tissue structures about the chest are unremarkable. IMPRESSION: 1. Lungs are clear and there is no evidence of acute cardiopulmonary abnormality. No evidence of pneumonia. 2. Mild prominence of the upper mediastinum, of uncertain significance. This scan be seen with thyroid goiter. Given patient's previous smoking history, would consider chest CT for further characterization. Electronically Signed   By: Franki Cabot M.D.   On: 09/19/2015 09:37   Ct Angio Chest Pe W/cm &/or Wo Cm  09/19/2015  CLINICAL DATA:  Short of breath.  Abnormal chest x-ray EXAM: CT ANGIOGRAPHY CHEST WITH CONTRAST TECHNIQUE: Multidetector CT imaging of the chest was performed using the standard protocol during bolus administration of intravenous contrast. Multiplanar CT image reconstructions and MIPs were obtained to evaluate the vascular anatomy. CONTRAST:  100 mL Isovue 370 IV COMPARISON:  Chest x-ray 09/19/2015 FINDINGS: Negative for pulmonary embolism. Pulmonary arteries normal in caliber. Negative for aortic aneurysm or dissection. Heart size normal. No pericardial effusion. Lungs are clear without infiltrate or effusion. No mass or adenopathy. Right upper lobe density on chest x-ray represents mediastinal widening due to vascular ectasia. No collapse or mass lesion. No adenopathy. Negative upper abdomen. Review of the MIP images confirms the above findings. IMPRESSION: Negative for pulmonary embolism. No acute abnormality  in the chest. Negative for right upper lobe mass lesion. Electronically Signed   By: Franchot Gallo M.D.   On: 09/19/2015 11:38   I have personally reviewed and evaluated these images and lab results as part of my medical decision-making.   EKG Interpretation None      MDM   Final diagnoses:  Bronchitis  Dyspnea  Acute bronchospasm    Feels better after albuterol.  Likely bronchitis with bronchospasm.  Home with short course of azithromycin and albuterol for cough.  No indication for steroids.  Abnormal chest x-ray however CT scan demonstrates no significant mallet.  No clear infiltrate.  Low-grade fever and productive cough therefore may represent early developing pneumonia.  He understands to return to the ER for new or worsening symptoms.  Primary care follow-up.    Jola Schmidt, MD 09/19/15 [phone]

## 2015-09-19 NOTE — ED Notes (Signed)
Pt reports cough with sinus pain and pressure to his sinuses and face, and nasal congestion. Reports temp of 101 yesterday.

## 2015-09-20 ENCOUNTER — Ambulatory Visit (INDEPENDENT_AMBULATORY_CARE_PROVIDER_SITE_OTHER): Payer: Medicare Other | Admitting: Internal Medicine

## 2015-09-20 ENCOUNTER — Encounter: Payer: Self-pay | Admitting: Internal Medicine

## 2015-09-20 ENCOUNTER — Other Ambulatory Visit (INDEPENDENT_AMBULATORY_CARE_PROVIDER_SITE_OTHER): Payer: Medicare Other

## 2015-09-20 VITALS — BP 132/74 | HR 112 | Ht 71.0 in | Wt 264.0 lb

## 2015-09-20 DIAGNOSIS — R05 Cough: Secondary | ICD-10-CM

## 2015-09-20 DIAGNOSIS — J45991 Cough variant asthma: Secondary | ICD-10-CM | POA: Insufficient documentation

## 2015-09-20 DIAGNOSIS — R058 Other specified cough: Secondary | ICD-10-CM | POA: Insufficient documentation

## 2015-09-20 LAB — CBC WITH DIFFERENTIAL/PLATELET
BASOS ABS: 0 10*3/uL (ref 0.0–0.1)
Basophils Relative: 0.4 % (ref 0.0–3.0)
EOS ABS: 0.2 10*3/uL (ref 0.0–0.7)
Eosinophils Relative: 2.8 % (ref 0.0–5.0)
HEMATOCRIT: 41 % (ref 39.0–52.0)
HEMOGLOBIN: 13.6 g/dL (ref 13.0–17.0)
Lymphocytes Relative: 24.9 % (ref 12.0–46.0)
Lymphs Abs: 1.9 10*3/uL (ref 0.7–4.0)
MCHC: 33.1 g/dL (ref 30.0–36.0)
MCV: 92.8 fl (ref 78.0–100.0)
MONOS PCT: 13 % — AB (ref 3.0–12.0)
Monocytes Absolute: 1 10*3/uL (ref 0.1–1.0)
Neutro Abs: 4.5 10*3/uL (ref 1.4–7.7)
Neutrophils Relative %: 58.9 % (ref 43.0–77.0)
Platelets: 214 10*3/uL (ref 150.0–400.0)
RBC: 4.42 Mil/uL (ref 4.22–5.81)
RDW: 14.1 % (ref 11.5–15.5)
WBC: 7.6 10*3/uL (ref 4.0–10.5)

## 2015-09-20 MED ORDER — METHYLPREDNISOLONE ACETATE 80 MG/ML IJ SUSP
120.0000 mg | Freq: Once | INTRAMUSCULAR | Status: AC
  Administered 2015-09-20: 120 mg via INTRAMUSCULAR

## 2015-09-20 MED ORDER — MOMETASONE FURO-FORMOTEROL FUM 100-5 MCG/ACT IN AERO: INHALATION_SPRAY | RESPIRATORY_TRACT | Status: DC

## 2015-09-20 MED ORDER — TRAMADOL HCL 50 MG PO TABS: ORAL_TABLET | ORAL | Status: DC

## 2015-09-20 NOTE — Progress Notes (Signed)
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 pulmonary consult    self ref. pt states he was seen @ ED on 5/22 dx w/bronchitis. pt c/o sob, prod cough green in color, wheezing, cp/tightness X21mo. exposed to black mold  symptoms fo cough > sob  are 24/7 daily x 5 yearsWorse at hs/ keeps him up most of the night  humidfier seemed to help at least initially with persistent sinus pressure despite multiple abx/ prednisone  Not able to sing at all anymore s coughing fits  / already on ppi but not ac/ using lots of mints/ cough drops  Last lived in house 2 weeks prior to Andrew Larsen  - feel like does better out of rented house where mold discovered ? Some better with saba? p er eval 5/22 and rx with zpak  No obvious other patterns in day to day or daytime variabilty or assoc   cp or chest tightness, subjective wheeze overt  hb symptoms. No unusual exp hx or h/o childhood pna/ asthma or knowledge of premature birth.  Sleeping ok without nocturnal  or early am exacerbation  of respiratory  c/o's or need for noct saba. Also denies any obvious fluctuation of symptoms with weather or environmental changes or other aggravating or alleviating factors except as outlined above   Current Medications, Allergies, Complete Past Medical History, Past Surgical History, Family History, and Social History were reviewed in [employer] record.        Review of Systems  Constitutional: Positive for fever. Negative for unexpected weight change.  HENT: Positive for congestion, sinus pressure, sore throat and trouble swallowing. Negative  for dental problem, ear pain, nosebleeds, postnasal drip, rhinorrhea and sneezing.   Eyes: Negative for redness and itching.  Respiratory: Positive for cough, chest tightness, shortness of breath and wheezing.   Cardiovascular: Positive for leg swelling. Negative for palpitations.  Gastrointestinal: Negative for nausea and vomiting.  Genitourinary: Negative for dysuria.  Musculoskeletal: Negative for joint swelling.  Skin: Positive for rash.  Hematological: Does not bruise/bleed easily.  Psychiatric/Behavioral: Negative for dysphoric mood. The patient is nervous/anxious.        Objective:   Physical Exam   amb hoarse bm nad with severe coughing fits    Wt Readings from Last 3 Encounters:  09/20/15 264 lb (119.75 kg)  09/19/15 260 lb (117.935 kg)    Vital signs reviewed       HEENT: nl  turbinates, and oropharynx. Nl external ear canals without cough reflex - edentulous    NECK :  without JVD/Nodes/TM/ nl carotid upstrokes bilaterally   LUNGS: no acc muscle use,  Nl contour chest with min insp/exp wheeze and exp cough  CV:  RRR  no s3 or murmur or increase in P2, no edema   ABD:  soft and nontender with nl inspiratory excursion in the supine position. No bruits or organomegaly, bowel sounds nl  MS:  Nl gait/ ext warm without deformities, calf tenderness, cyanosis or clubbing No obvious joint restrictions   SKIN: warm and dry without lesions    NEURO:  alert, approp, nl sensorium with  no motor deficits      CTa Chest 09/19/15 Negative for pulmonary embolism.  No acute abnormality in the chest. Negative for right upper lobe mass lesion.     Assessment & Plan:

## 2015-09-20 NOTE — Patient Instructions (Addendum)
I strongly recommend you have eye doctor call in a substitute for timolol which can make you wheeze / cough  Automatically:  dulera 100 Take 2 puffs first thing in am and then another 2 puffs about 12 hours later.   Only use your albuterol as a rescue medication to be used if you can't catch your breath by resting or doing a relaxed purse lip breathing pattern.  - The less you use it, the better it will work when you need it. - Ok to use up to 2 puffs  every 4 hours if you must but call for immediate appointment if use goes up over your usual need - Don't leave home without it !!  (think of it like the spare tire for your car)   Please see patient coordinator before you leave today  to schedule sinus CT   Depomedrol 120 mg IM today  Take delsym two tsp every 12 hours and supplement if needed with  tramadol 50 mg up to 1-2 every 4 hours to suppress the urge to cough. Swallowing water or using ice chips/non mint and menthol containing candies (such as lifesavers or sugarless jolly ranchers) are also effective.  You should rest your voice and avoid activities that you know make you cough.  Once you have eliminated the cough for 3 straight days try reducing the tramadol first,  then the delsym as tolerated.    Please remember to go to the lab department downstairs for your tests - we will call you with the results when they are available.  Change the omeprazole Take 30- 60 min before your first and last meals of the day   GERD (REFLUX)  is an extremely common cause of respiratory symptoms just like yours , many times with no obvious heartburn at all.    It can be treated with medication, but also with lifestyle changes including elevation of the head of your bed (ideally with 6 inch  bed blocks),  Smoking cessation, avoidance of late meals, excessive alcohol, and avoid fatty foods, chocolate, peppermint, colas, red wine, and acidic juices such as orange juice.  NO MINT OR MENTHOL PRODUCTS SO NO  COUGH DROPS  USE SUGARLESS CANDY INSTEAD (Jolley ranchers or Stover's or Life Savers) or even ice chips will also do - the key is to swallow to prevent all throat clearing. NO OIL BASED VITAMINS - use powdered substitutes.    Please schedule a follow up office visit in 2 weeks, sooner if needed bring all medication with you

## 2015-09-21 ENCOUNTER — Encounter: Payer: Self-pay | Admitting: Internal Medicine

## 2015-09-21 LAB — RESPIRATORY ALLERGY PROFILE REGION II ~~LOC~~
Allergen, Cedar tree, t12: <0.1 kU/L
Allergen, D pternoyssinus,d7: <0.1 kU/L
Allergen, Mouse Urine Protein, e78: <0.1 kU/L
Allergen, Oak,t7: <0.1 kU/L
Alternaria Alternata: <0.1 kU/L
Aspergillus fumigatus, m3: <0.1 kU/L
Bermuda Grass: <0.1 kU/L
Box Elder IgE: <0.1 kU/L
Cockroach: <0.1 kU/L
Common Ragweed: <0.1 kU/L
D. farinae: <0.1 kU/L
Dog Dander: <0.1 kU/L
IgE (Immunoglobulin E), Serum: 13 kU/L (ref <115)

## 2015-09-21 NOTE — Assessment & Plan Note (Addendum)
The most common causes of chronic cough in immunocompetent adults include the following: upper airway cough syndrome (UACS), previously referred to as postnasal drip syndrome (PNDS), which is caused by variety of rhinosinus conditions; (2) asthma; (3) GERD; (4) chronic bronchitis from cigarette smoking or other inhaled environmental irritants; (5) nonasthmatic eosinophilic bronchitis; and (6) bronchiectasis.   These conditions, singly or in combination, have accounted for up to 94% of the causes of chronic cough in prospective studies.   Other conditions have constituted no >6% of the causes in prospective studies These have included bronchogenic carcinoma, chronic interstitial pneumonia, sarcoidosis, left ventricular failure, ACEI-induced cough, and aspiration from a condition associated with pharyngeal dysfunction.    Chronic cough is often simultaneously caused by more than one condition. A single cause has been found from 38 to 82% of the time, multiple causes from 18 to 62%. Multiply caused cough has been the result of three diseases up to 42% of the time.       Based on hx and exam, this is most likely:  Classic Upper airway cough syndrome, so named because it's frequently impossible to sort out how much is  CR/sinusitis with freq throat clearing (which can be related to primary GERD)   vs  causing  secondary (" extra esophageal")  GERD from wide swings in gastric pressure that occur with throat clearing, often  promoting self use of mint and menthol lozenges that reduce the lower esophageal sphincter tone and exacerbate the problem further in a cyclical fashion.   These are the same pts (now being labeled as having "irritable larynx syndrome" by some cough centers) who not infrequently have a history of having failed to tolerate ace inhibitors,  dry powder inhalers or biphosphonates or report having atypical reflux symptoms that don't respond to standard doses of PPI , and are easily confused as  having aecopd or asthma flares by even experienced allergists/ pulmonologists.   The first step is to maximize acid suppression and eliminate cyclical coughing while eval for sinus dz/allergy  then regroup in 2 weeks  I had an extended discussion with the patient/wife reviewing all relevant studies completed to date and  lasting 35 min/ 60 min initial office eval  1) Explained: The standardized cough guidelines published in Chest by Lissa Morales in 2006 are still the best available and consist of a multiple step process (up to 12!) , not a single office visit,  and are intended  to address this problem logically,  with an alogrithm dependent on response to empiric treatment at  each progressive step  to determine a specific diagnosis with  minimal addtional testing needed. Therefore if adherence is an issue or can't be accurately verified,  it's very unlikely the standard evaluation and treatment will be successful here.    Furthermore, response to therapy (other than acute cough suppression, which should only be used short term with avoidance of narcotic containing cough syrups if possible), can be a gradual process for which the patient may perceive immediate benefit.  Unlike going to an eye doctor where the best perscription is almost always the first one and is immediately effective, this is almost never the case in the management of chronic cough syndromes. Therefore the patient needs to commit up front to consistently adhere to recommendations  for up to 6 weeks of therapy directed at the likely underlying problem(s) before the response can be reasonably evaluated.     2) Each maintenance medication was reviewed in detail including most  importantly the difference between maintenance and prns and under what circumstances the prns are to be triggered using an action plan format that is not reflected in the computer generated alphabetically organized AVS.    Please see instructions for details  which were reviewed in writing and the patient given a copy highlighting the part that I personally wrote and discussed at today's ov.   See instructions for specific recommendations which were reviewed directly with the patient who was given a copy with highlighter outlining the key components.

## 2015-09-21 NOTE — Assessment & Plan Note (Signed)
Body mass index is 36.84    No results found for: TSH   Contributing to gerd tendency/ doe/reviewed the need and the process to achieve and maintain neg calorie balance > defer f/u primary care including intermittently monitoring thyroid status

## 2015-09-21 NOTE — Assessment & Plan Note (Signed)
Not clear to what extent this is asthma at all but if there is asthma component need to use the lowest dose of ics to prevent irritating the upper airway  - The proper method of use, as well as anticipated side effects, of a metered-dose inhaler are discussed and demonstrated to the patient. Improved effectiveness after extensive coaching during this visit to a level of approximately 75 % from a baseline of 25 % > try dulera 100 2bid sample x 2 weeks only    - need ideally not to use timolol in this setting, will rec substitute at least in short run

## 2015-09-22 ENCOUNTER — Ambulatory Visit (INDEPENDENT_AMBULATORY_CARE_PROVIDER_SITE_OTHER)
Admission: RE | Admit: 2015-09-22 | Discharge: 2015-09-22 | Disposition: A | Discharge status: Home / Self Care | Payer: Medicare Other | Source: Ambulatory Visit | Attending: Internal Medicine | Admitting: Internal Medicine

## 2015-09-22 DIAGNOSIS — R05 Cough: Secondary | ICD-10-CM

## 2015-09-22 DIAGNOSIS — R058 Other specified cough: Secondary | ICD-10-CM

## 2015-09-22 NOTE — Progress Notes (Signed)
Quick Note:  Spoke with pt. Discussed lab results and recs per Dr. Melvyn Novas. Pt verbalized understanding and confirmed pending appt in June with MW. Nothing further needed. ______

## 2015-09-23 ENCOUNTER — Other Ambulatory Visit: Payer: Self-pay | Admitting: Internal Medicine

## 2015-09-23 MED ORDER — AMOXICILLIN-POT CLAVULANATE 875-125 MG PO TABS: 1.0000 | ORAL_TABLET | Freq: Two times a day (BID) | ORAL | Status: DC

## 2015-09-23 NOTE — Progress Notes (Signed)
Quick Note:  Spoke with pt and notified of results per Dr. Melvyn Novas. Pt verbalized understanding and denied any questions. rx sent to pharm ______

## 2015-09-27 ENCOUNTER — Institutional Professional Consult (permissible substitution): Payer: Self-pay | Admitting: Internal Medicine

## 2015-10-04 ENCOUNTER — Telehealth: Payer: Self-pay | Admitting: Internal Medicine

## 2015-10-04 ENCOUNTER — Ambulatory Visit: Payer: Medicare Other | Admitting: Internal Medicine

## 2015-10-04 NOTE — Telephone Encounter (Signed)
Patient states that his cough has worsened. Patient finished antibiotics, using cough tablets that was given to help with the cough, but ran out, coughing up yellow mucus.  Coughing more at night than during the day.    [facility]  Patient Instructions     I strongly recommend you have eye doctor call in a substitute for timolol which can make you wheeze / cough  Automatically: dulera 100 Take 2 puffs first thing in am and then another 2 puffs about 12 hours later.   Only use your albuterol as a rescue medication to be used if you can't catch your breath by resting or doing a relaxed purse lip breathing pattern.  - The less you use it, the better it will work when you need it. - Ok to use up to 2 puffs every 4 hours if you must but call for immediate appointment if use goes up over your usual need - Don't leave home without it !! (think of it like the spare tire for your car)   Please see patient coordinator before you leave today to schedule sinus CT   Depomedrol 120 mg IM today  Take delsym two tsp every 12 hours and supplement if needed with tramadol 50 mg up to 1-2 every 4 hours to suppress the urge to cough. Swallowing water or using ice chips/non mint and menthol containing candies (such as lifesavers or sugarless jolly ranchers) are also effective. You should rest your voice and avoid activities that you know make you cough.  Once you have eliminated the cough for 3 straight days try reducing the tramadol first, then the delsym as tolerated.   Please remember to go to the lab department downstairs for your tests - we will call you with the results when they are available.  Change the omeprazole Take 30- 60 min before your first and last meals of the day   GERD (REFLUX) is an extremely common cause of respiratory symptoms just like yours , many times with no obvious heartburn at all.   It can be treated with medication, but also with lifestyle changes including  elevation of the head of your bed (ideally with 6 inch bed blocks), Smoking cessation, avoidance of late meals, excessive alcohol, and avoid fatty foods, chocolate, peppermint, colas, red wine, and acidic juices such as orange juice.  NO MINT OR MENTHOL PRODUCTS SO NO COUGH DROPS  USE SUGARLESS CANDY INSTEAD (Jolley ranchers or Stover's or Life Savers) or even ice chips will also do - the key is to swallow to prevent all throat clearing. NO OIL BASED VITAMINS - use powdered substitutes.   Please schedule a follow up office visit in 2 weeks, sooner if needed bring all medication with you

## 2015-10-04 NOTE — Telephone Encounter (Signed)
Pt has been scheduled to see TP at 9:30am Wednesday 10-05-15 at 9:30 am.

## 2015-10-04 NOTE — Telephone Encounter (Signed)
Note the instruction from 5/23  Please schedule a follow up office visit in 2 weeks, sooner if needed bring all medication with you   So... Will need ov with all meds in hand to see me or one of our NPs this week

## 2015-10-05 ENCOUNTER — Ambulatory Visit (INDEPENDENT_AMBULATORY_CARE_PROVIDER_SITE_OTHER): Payer: Medicare Other | Admitting: Adult Health

## 2015-10-05 ENCOUNTER — Encounter: Payer: Self-pay | Admitting: Adult Health

## 2015-10-05 VITALS — BP 136/68 | HR 93 | Ht 71.0 in | Wt 262.0 lb

## 2015-10-05 DIAGNOSIS — J45991 Cough variant asthma: Secondary | ICD-10-CM | POA: Diagnosis not present

## 2015-10-05 MED ORDER — BENZONATATE 200 MG PO CAPS: 200.0000 mg | ORAL_CAPSULE | Freq: Three times a day (TID) | ORAL | Status: DC | PRN

## 2015-10-05 MED ORDER — TRAMADOL HCL 50 MG PO TABS: 50.0000 mg | ORAL_TABLET | Freq: Four times a day (QID) | ORAL | Status: DC | PRN

## 2015-10-05 MED ORDER — PREDNISONE 10 MG PO TABS: ORAL_TABLET | ORAL | Status: DC

## 2015-10-05 NOTE — Progress Notes (Signed)
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 Some better with saba? p er eval 5/22 and rx with zpak >>>depo medrol, change timolol eye drop and cont on [facility]   10/11/2015 Acute OV  Pt returns for persistent symptoms of cough and wheezing .  He was seen 3 weeks ago for pulmonary consult for cough and dyspnea . Tx for bronchitis with abx and steroids without much help. He was given depo medrol injection , continue on Dulera. And recommended to change to Timolol. He was not able to get Timolol changed.  CT sinus showed layering fluid in the right sphenoid. He was started on ten-day course of Augmentin. CT chest showed no acute process and no pulmonary embolism.  Complains of  cough, wheezing, SOB and chest tightness. Cough is producing brown mucus at times. Symptoms have not improved since last 3 weeks.  She denies any fever, hemoptysis, orthopnea, PND, or increased leg swelling  Past Medical History  Diagnosis Date  . GERD (gastroesophageal reflux disease)   . Fracture     right fibula/wears boot cast  . Hyperlipidemia   . GERD (gastroesophageal reflux disease)   . Mold exposure   . Daytime somnolence   . Diabetes mellitus without complication ([place])     . Hypertension   . Hypercholesteremia   . Reflux   . Glaucoma    Current Outpatient Prescriptions on File Prior to Visit  Medication Sig Dispense Refill  . albuterol (PROVENTIL HFA;VENTOLIN HFA) 108 (90 Base) MCG/ACT inhaler Inhale 2 puffs into the lungs every 4 (four) hours as needed for wheezing or shortness of breath. 1 Inhaler 0  . Ascorbic Acid (VITAMIN C) 100 MG tablet Take 100 mg by mouth daily.    Marland Kitchen aspirin 81 MG chewable tablet Chew by mouth. Reported on 09/05/2015    . aspirin 81 MG tablet Take 81 mg by mouth daily.    . brimonidine-timolol (COMBIGAN) 0.2-0.5 % ophthalmic solution 1 drop every 12 (twelve) hours.     . brimonidine-timolol (COMBIGAN) 0.2-0.5 % ophthalmic solution Place 1 drop into both eyes every 12 (twelve) hours.    . Calcium Carbonate (CALCIUM 600 PO) Take by mouth. Take 2 capsules daily    . Cholecalciferol (VITAMIN D3) 3000 units TABS Take 5,000 Units by mouth. Take 2 pills daily    . Fish Oil-Cholecalciferol (FISH OIL + D3 PO) Take by mouth.    . gabapentin (NEURONTIN) 300 MG capsule Take 300 mg by mouth. Take 2 pills tid    . gabapentin (NEURONTIN) 300 MG capsule Take 300 mg by mouth 2 (two) times daily.    Marland Kitchen ibuprofen (ADVIL,MOTRIN) 800 MG tablet Take 800 mg by  mouth every 8 (eight) hours as needed.    . insulin lispro protamine-lispro (HUMALOG 75/25 MIX) (75-25) 100 UNIT/ML SUSP injection Inject into the skin. 44 units am, 49 units pm    . insulin lispro protamine-lispro (HUMALOG 75/25) (75-25) 100 UNIT/ML SUSP Inject 44-49 Units into the skin 2 (two) times daily. Reported on 08/23/2015    . latanoprost (XALATAN) 0.005 % ophthalmic solution Place 1 drop into both eyes at bedtime.    Marland Kitchen latanoprost (XALATAN) 0.005 % ophthalmic solution 1 drop at bedtime.    Marland Kitchen losartan (COZAAR) 50 MG tablet Take 50 mg by mouth every morning.    Marland Kitchen losartan (COZAAR) 50 MG tablet Take 50 mg by mouth daily.    . metFORMIN (GLUCOPHAGE) 500 MG tablet Take by mouth 2 (two) times daily  with a meal.    . mometasone-formoterol (DULERA) 100-5 MCG/ACT AERO Take 2 puffs first thing in am and then another 2 puffs about 12 hours later.    . Multiple Vitamin (MULTIVITAMIN) capsule Take by mouth.    . Multiple Vitamins-Minerals (CENTRUM SILVER ADULT 50+ PO) Take 1 tablet by mouth every morning.    . Multiple Vitamins-Minerals (CENTRUM SILVER ADULT 50+ PO) Take by mouth.    Marland Kitchen omeprazole (PRILOSEC) 40 MG capsule Take 40 mg by mouth 2 (two) times daily.     Marland Kitchen omeprazole (PRILOSEC) 40 MG capsule Take 40 mg by mouth daily.    Marland Kitchen oxybutynin (DITROPAN) 5 MG tablet Take 5 mg by mouth 2 (two) times daily.    Marland Kitchen oxybutynin (DITROPAN) 5 MG tablet     . Potassium (POTASSIMIN PO) Take by mouth.    . pravastatin (PRAVACHOL) 40 MG tablet Take 40 mg by mouth daily.    . pravastatin (PRAVACHOL) 40 MG tablet Take 40 mg by mouth daily.    . tamsulosin (FLOMAX) 0.4 MG CAPS capsule Take 0.4 mg by mouth daily.    . tamsulosin (FLOMAX) 0.4 MG CAPS capsule     . traMADol (ULTRAM) 50 MG tablet 1-2 every 4 hours as needed for cough or pain 40 tablet 0  . triamterene-hydrochlorothiazide (DYAZIDE) 37.5-25 MG per capsule Take 1 capsule by mouth every morning.    Marland Kitchen VITAMIN D, ERGOCALCIFEROL, PO Take by mouth.     No current facility-administered medications on file prior to visit.    Current Medications, Allergies, Complete Past Medical History, Past Surgical History, Family History, and Social History were reviewed in [employer] record.        Review of Systems  Constitutional:   No  weight loss, night sweats,  Fevers, chills, fatigue, or  lassitude.  HEENT:   No headaches,  Difficulty swallowing,  Tooth/dental problems, or  Sore throat,                No sneezing, itching, ear ache, nasal congestion, post nasal drip,   CV:  No chest pain,  Orthopnea, PND, swelling in lower extremities, anasarca, dizziness, palpitations, syncope.   GI  No heartburn, indigestion, abdominal pain,  nausea, vomiting, diarrhea, change in bowel habits, loss of appetite, bloody stools.   Resp: No shortness of breath with exertion or at rest.  No excess mucus, no productive cough,  No non-productive cough,  No coughing up of blood.  No change in color of mucus.  No wheezing.  No chest wall deformity  Skin: no rash or lesions.  GU: no dysuria, change in color of urine, no urgency or frequency.  No flank pain,  no hematuria   MS:  No joint pain or swelling.  No decreased range of motion.  No back pain.  Psych:  No change in mood or affect. No depression or anxiety.  No memory loss.         Objective:   Physical Exam   amb hoarse bm nad obese  Filed Vitals:   10/05/15 0944  BP: 136/68  Pulse: 93  Height: 5\' 11"  (1.803 m)  Weight: 262 lb (118.842 kg)  SpO2: 93%     Body mass index is 36.56 kg/(m^2).  Vital signs reviewed       HEENT: nl  turbinates, and oropharynx. Nl external ear canals without cough reflex - edentulous    NECK :  without JVD/Nodes/TM/ nl carotid upstrokes bilaterally   LUNGS: no acc muscle use,  Nl contour chest with min insp/exp wheeze and exp cough  CV:  RRR  no s3 or murmur or increase in P2, no edema   ABD:  soft and nontender with nl inspiratory excursion in the supine position. No bruits or organomegaly, bowel sounds nl  MS:  Nl gait/ ext warm without deformities, calf tenderness, cyanosis or clubbing No obvious joint restrictions   SKIN: warm and dry without lesions    NEURO:  alert, approp, nl sensorium with  no motor deficits      CTa Chest 09/19/15 Negative for pulmonary embolism.  No acute abnormality in the chest. Negative for right upper lobe mass lesion.   Tammy Parrett NP-C  [facility]  10/05/2015

## 2015-10-05 NOTE — Patient Instructions (Signed)
Prednisone taper over next week  Check blood sugars call if >250.  Delsym 2 tsp Twice daily  For cough  Tessalon Three times a day  For cough  Tramadol 50mg  1 every 4hr As needed  Severe cough, may make you sleepy.  Begin Zyrtec 10mg  At bedtime   follow up Dr. Melvyn Novas  In 4 weeks and As needed   Please contact office for sooner follow up if symptoms do not improve or worsen or seek emergency care

## 2015-10-11 NOTE — Assessment & Plan Note (Signed)
Flare with [facility]   Plan  Prednisone taper over next week  Check blood sugars call if >250.  Delsym 2 tsp Twice daily  For cough  Tessalon Three times a day  For cough  Tramadol 50mg  1 every 4hr As needed  Severe cough, may make you sleepy.  Begin Zyrtec 10mg  At bedtime   follow up Dr. Melvyn Novas  In 4 weeks and As needed   Please contact office for sooner follow up if symptoms do not improve or worsen or seek emergency care

## 2015-10-18 ENCOUNTER — Institutional Professional Consult (permissible substitution): Payer: Self-pay | Admitting: Internal Medicine

## 2015-10-20 ENCOUNTER — Other Ambulatory Visit: Payer: Self-pay | Admitting: Internal Medicine

## 2015-10-25 ENCOUNTER — Other Ambulatory Visit: Payer: Self-pay | Admitting: Adult Health

## 2015-11-08 ENCOUNTER — Encounter: Payer: Self-pay | Admitting: Internal Medicine

## 2015-11-08 ENCOUNTER — Ambulatory Visit (INDEPENDENT_AMBULATORY_CARE_PROVIDER_SITE_OTHER): Payer: Medicare Other | Admitting: Internal Medicine

## 2015-11-08 VITALS — BP 134/84 | HR 104 | Ht 70.0 in | Wt 264.0 lb

## 2015-11-08 DIAGNOSIS — R05 Cough: Secondary | ICD-10-CM | POA: Diagnosis not present

## 2015-11-08 DIAGNOSIS — J45991 Cough variant asthma: Secondary | ICD-10-CM

## 2015-11-08 DIAGNOSIS — R058 Other specified cough: Secondary | ICD-10-CM

## 2015-11-08 LAB — NITRIC OXIDE: Nitric Oxide: 17

## 2015-11-08 NOTE — Progress Notes (Signed)
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 initially rx lozenges worse cough  than usual since 06/2015 so self referred to pulmonary clinic 09/20/2015     History of Present Illness  09/20/15 1st [name] office visit/ Andrew Larsen  symptoms for cough > sob are 24/7 daily x 5 yearsWorse at hs/ keeps him up most of the night humidfier seemed to help at least initially with persistent sinus pressure despite multiple abx/ prednisone  Not able to sing at all anymore s coughing fits / already on ppi but not ac/ using lots of mints/ cough drops Last lived in house 2 weeks prior to [place] - feel like does better out of rented house where mold discovered ? Some better with saba? p er eval 5/22 and rx with zpak rec I strongly recommend you have eye doctor call in a substitute for timolol which can make you wheeze / cough Automatically:  dulera 100 Take 2 puffs first thing in am and then another 2 puffs about 12 hours later.  Only use your albuterol as a rescue medication  Please see patient coordinator before you leave today  to schedule sinus CT  Depomedrol 120 mg IM today Take delsym two tsp every 12 hours and supplement if needed with  tramadol 50 mg up to 1-2 every 4 hours to suppress the urge to cough. Swallowing water or using ice chips/non mint and menthol containing candies (such as lifesavers or sugarless jolly ranchers) are also effective.  You should rest your voice and avoid activities that you know make you cough. Once you have eliminated the cough for 3 straight days try reducing the tramadol first,  then the delsym as tolerated.   Please remember to go to the lab department downstairs for your tests - we will call you with the results when they are available. Change the omeprazole Take 30- 60 min  before your first and last meals of the day  GERD diet Please schedule a follow up office visit in 2 weeks, sooner if needed bring all medication with you       10/11/2015 NP Acute OV  Pt returns for persistent symptoms of cough and wheezing .  He was seen 3 weeks ago for pulmonary consult for cough and dyspnea . Tx for bronchitis with abx and steroids without much help. He was given depo medrol injection , continue on Dulera. And recommended to change to Timolol. He was not able to get Timolol changed.  CT sinus showed layering fluid in the right sphenoid. He was started on ten-day course of Augmentin. CT chest showed no acute process and no pulmonary embolism. Complains of  cough, wheezing, SOB and chest tightness. Cough is producing brown mucus at times.  rec Prednisone taper over next week  Check blood sugars call if >250.  Delsym 2 tsp Twice daily  For cough  Tessalon Three times a day  For cough  Tramadol 28m 1 every 4hr As needed  Severe cough, may make you sleepy.  Begin Zyrtec 162mAt bedtime   follow up Dr. WeMelvyn NovasIn 4 weeks and As needed       11/08/2015  f/u ov/Andrew Larsen re: cough x 5 years / did not follow instructions re ppi/ dulera and has stopped gabapentin Chief  Complaint  Patient presents with  . Follow-up    Cough has improved some. No new co's today.   on an extremely complex med list and making changes in maint meds somewhat ad lib  cough tends to be worse early in am / wakes up each am  With cough min mucoid/ no long brown mucus  No longer  limited by breathing from desired activities    No obvious day to day or daytime variability or assoc cp or chest tightness, subjective wheeze or overt sinus or hb symptoms. No unusual exp hx or h/o childhood pna/ asthma or knowledge of premature birth.  Sleeping ok without nocturnal  or early am exacerbation  of respiratory  c/o's or need for noct saba. Also denies any obvious fluctuation of symptoms with weather or  environmental changes or other aggravating or alleviating factors except as outlined above   Current Medications, Allergies, Complete Past Medical History, Past Surgical History, Family History, and Social History were reviewed in [employer] record.  ROS  The following are not active complaints unless bolded sore throat, dysphagia, dental problems, itching, sneezing,  nasal congestion or excess/ purulent secretions, ear ache,   fever, chills, sweats, unintended wt loss, classically pleuritic or exertional cp, hemoptysis,  orthopnea pnd or leg swelling, presyncope, palpitations, abdominal pain, anorexia, nausea, vomiting, diarrhea  or change in bowel or bladder habits, change in stools or urine, dysuria,hematuria,  rash, arthralgias, visual complaints, headache, numbness, weakness or ataxia or problems with walking or coordination,  change in mood/affect or memory.              Objective:   Physical Exam   amb hoarse bm nad obese/ vigorous throat clearing    Wt Readings from Last 3 Encounters:  11/08/15 264 lb (119.75 kg)  10/05/15 262 lb (118.842 kg)  09/20/15 264 lb (119.75 kg)    Vital signs reviewed    HEENT: nl  turbinates, and oropharynx. Nl external ear canals without cough reflex - edentulous    NECK :  without JVD/Nodes/TM/ nl carotid upstrokes bilaterally   LUNGS: no acc muscle use,  Nl contour chest with no def cough on insp or exp  CV:  RRR  no s3 or murmur or increase in P2, no edema   ABD:  soft and nontender with nl inspiratory excursion in the supine position. No bruits or organomegaly, bowel sounds nl  MS:  Nl gait/ ext warm without deformities, calf tenderness, cyanosis or clubbing No obvious joint restrictions   SKIN: warm and dry without lesions    NEURO:  alert, approp, nl sensorium with  no motor deficits      CTa Chest 09/19/15 Negative for pulmonary embolism.  No acute abnormality in the chest. Negative for right  upper lobe mass lesion.

## 2015-11-08 NOTE — Patient Instructions (Addendum)
Omeprazole 40 mg Take 30- 60 min before your first and last meals of the day  Add Pepcid ac 20 mg  One at bedtime  Please see patient coordinator before you leave today  to schedule Sinus CT   Stay on the subsitute eye drops   GERD (REFLUX)  is an extremely common cause of respiratory symptoms just like yours , many times with no obvious heartburn at all.    It can be treated with medication, but also with lifestyle changes including elevation of the head of your bed (ideally with 6 inch  bed blocks),  Smoking cessation, avoidance of late meals, excessive alcohol, and avoid fatty foods, chocolate, peppermint, colas, red wine, and acidic juices such as orange juice.  NO MINT OR MENTHOL PRODUCTS SO NO COUGH DROPS  USE SUGARLESS CANDY INSTEAD (Jolley ranchers or Stover's or Life Savers) or even ice chips will also do - the key is to swallow to prevent all throat clearing. NO OIL BASED VITAMINS - use powdered substitutes.    See Tammy NP w/in 2 weeks or next available thereafter wiith all your medications, even over the counter meds, separated in two separate bags, the ones you take no matter what vs the ones you stop once you feel better and take only as needed when you feel you need them.   Tammy  will generate for you a new user friendly medication calendar that will put [place] all on the same page re: your medication use.     Without this process, it simply isn't possible to assure that we are providing  your outpatient care  with  the attention to detail we feel you deserve.   If we cannot assure that you're getting that kind of care,  then we cannot manage your problem effectively from this clinic.  Once you have seen Tammy and we are sure that we're all on the same page with your medication use she will arrange follow up with me.

## 2015-11-13 NOTE — Assessment & Plan Note (Signed)
Not clear if asthma is present but wit feno so low the eosiniphilic ie steroid resp component is ok controlled and no need for higher doses of ICS which risk aggravating the upper airway so leave rec leave on dulera 100 2bid and off timolol for now

## 2015-11-13 NOTE — Assessment & Plan Note (Signed)
Body mass index is 37.88 kg/(m^2).  No results found for: TSH   Contributing to gerd tendency/ doe/reviewed the need and the process to achieve and maintain neg calorie balance > defer f/u primary care including intermittently monitoring thyroid status

## 2015-11-13 NOTE — Assessment & Plan Note (Signed)
Allergy profile 09/20/2015 >  Eos 0.2 /  IgE  13 with neg RAST - sinus CT 09/22/15  >>> Minor sinus layering fluid in the RIGHT division sphenoid, suggesting acuity > rec augmentin x 10 days  - NO 11/08/2015  = 17 - Repeat sinus CT 11/08/2015 >>>  - repeated rec for max gerd rx 11/08/2015  And on dulera 100/ off timoptic   Cough x 5 years has improved and still more suggestive of uacs than asthma and need to be sure based on noct and early am cough that the sinus dz has resolved or needs ent next  In the meantime, I had an extended discussion with the patient reviewing all relevant studies completed to date and  lasting 15 to 20 minutes of a 25 minute visit    The standardized cough guidelines published in Chest by Lissa Morales in 2006 are still the best available and consist of a multiple step process (up to 12!) , not a single office visit,  and are intended  to address this problem logically,  with an alogrithm dependent on response to empiric treatment at  each progressive step  to determine a specific diagnosis with  minimal addtional testing needed. Therefore if adherence is an issue or can't be accurately verified,  it's very unlikely the standard evaluation and treatment will be successful here.    Furthermore, response to therapy (other than acute cough suppression, which should only be used short term with avoidance of narcotic containing cough syrups if possible), can be a gradual process for which the patient is not likely to  perceive immediate benefit.  Unlike going to an eye doctor where the best perscription is almost always the first one and is immediately effective, this is almost never the case in the management of chronic cough syndromes. Therefore the patient needs to commit up front to consistently adhere to recommendations  for up to 6 weeks of therapy directed at the likely underlying problem(s) before the response can be reasonably evaluated.   Each maintenance medication was  reviewed in detail including most importantly the difference between maintenance and prns and under what circumstances the prns are to be triggered using an action plan format that is not reflected in the computer generated alphabetically organized AVS.    Please see instructions for details which were reviewed in writing and the patient given a copy highlighting the part that I personally wrote and discussed at today's ov.   Will return for med calendar before considering additional empirical trials.

## 2015-11-15 ENCOUNTER — Ambulatory Visit (INDEPENDENT_AMBULATORY_CARE_PROVIDER_SITE_OTHER)
Admission: RE | Admit: 2015-11-15 | Discharge: 2015-11-15 | Disposition: A | Discharge status: Home / Self Care | Payer: Medicare Other | Source: Ambulatory Visit | Attending: Internal Medicine | Admitting: Internal Medicine

## 2015-11-15 DIAGNOSIS — R05 Cough: Secondary | ICD-10-CM | POA: Diagnosis not present

## 2015-11-15 DIAGNOSIS — R058 Other specified cough: Secondary | ICD-10-CM

## 2015-11-24 ENCOUNTER — Ambulatory Visit (INDEPENDENT_AMBULATORY_CARE_PROVIDER_SITE_OTHER): Payer: Medicare Other | Admitting: Adult Health

## 2015-11-24 ENCOUNTER — Encounter: Payer: Self-pay | Admitting: Adult Health

## 2015-11-24 DIAGNOSIS — R05 Cough: Secondary | ICD-10-CM | POA: Diagnosis not present

## 2015-11-24 DIAGNOSIS — R058 Other specified cough: Secondary | ICD-10-CM

## 2015-11-24 MED ORDER — ALBUTEROL SULFATE HFA 108 (90 BASE) MCG/ACT IN AERS: 2.0000 | INHALATION_SPRAY | RESPIRATORY_TRACT | 5 refills | Status: DC | PRN

## 2015-11-24 NOTE — Assessment & Plan Note (Signed)
Improved on current regimen Patient's medications were reviewed today and patient education was given. Computerized medication calendar was adjusted/completed    Plan  Cont on current regimen  Follow up 4 months and As needed

## 2015-11-24 NOTE — Patient Instructions (Signed)
Follow med calendar closely and bring to each visit Continue on current Regimen. Follow-up with Dr. Melvyn Novas in 4 months and as needed

## 2015-11-24 NOTE — Progress Notes (Signed)
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 initially rx lozenges worse cough  than usual since 06/2015 so self referred to pulmonary clinic 09/20/2015     History of Present Illness  09/20/15 1st [name] office visit/ [facility]  symptoms for cough > sob are 24/7 daily x 5 yearsWorse at hs/ keeps him up most of the night humidfier seemed to help at least initially with persistent sinus pressure despite multiple abx/ prednisone  Not able to sing at all anymore s coughing fits / already on ppi but not ac/ using lots of mints/ cough drops Last lived in house 2 weeks prior to [place] - feel like does better out of rented house where mold discovered ? Some better with saba? p er eval 5/22 and rx with zpak rec I strongly recommend you have eye doctor call in a substitute for timolol which can make you wheeze / cough Automatically:  dulera 100 Take 2 puffs first thing in am and then another 2 puffs about 12 hours later.  Only use your albuterol as a rescue medication  Please see patient coordinator before you leave today  to schedule sinus CT  Depomedrol 120 mg IM today Take delsym two tsp every 12 hours and supplement if needed with  tramadol 50 mg up to 1-2 every 4 hours to suppress the urge to cough. Swallowing water or using ice chips/non mint and menthol containing candies (such as lifesavers or sugarless jolly ranchers) are also effective.  You should rest your voice and avoid activities that you know make you cough. Once you have eliminated the cough for 3 straight days try reducing the tramadol first,  then the delsym as tolerated.   Please remember to go to the lab department downstairs for your tests - we will call you with the results when they are available. Change the omeprazole Take 30- 60 min  before your first and last meals of the day  GERD diet Please schedule a follow up office visit in 2 weeks, sooner if needed bring all medication with you       10/11/2015 NP Acute OV  Pt returns for persistent symptoms of cough and wheezing .  He was seen 3 weeks ago for pulmonary consult for cough and dyspnea . Tx for bronchitis with abx and steroids without much help. He was given depo medrol injection , continue on Dulera. And recommended to change to Timolol. He was not able to get Timolol changed.  CT sinus showed layering fluid in the right sphenoid. He was started on ten-day course of Augmentin. CT chest showed no acute process and no pulmonary embolism. Complains of  cough, wheezing, SOB and chest tightness. Cough is producing brown mucus at times.  rec Prednisone taper over next week  Check blood sugars call if >250.  Delsym 2 tsp Twice daily  For cough  Tessalon Three times a day  For cough  Tramadol 28m 1 every 4hr As needed  Severe cough, may make you sleepy.  Begin Zyrtec 162mAt bedtime   follow up Dr. WeMelvyn NovasIn 4 weeks and As needed       11/08/2015  f/u ov/[facility] re: cough x 5 years / did not follow instructions re ppi/ dulera and has stopped gabapentin Chief  Complaint  Patient presents with  . Follow-up    Cough has improved some. No new co's today.   on an extremely complex med list and making changes in maint meds somewhat ad lib  cough tends to be worse early in am / wakes up each am  With cough min mucoid/ no long brown mucus >>pepcid At bedtime  Added.   11/24/2015 Follow up : Cough  Pt returns for 2 week follow up for cough and med review  We reviewed all his meds and organized them into a med calendar with pt education  Appears to be taking correctly.  Throat clearing and cough are better.  Pepcid was added At bedtime  Last ov.  CT chest in June this year , neg for PE . No acute process.  CT sinus was clear   Has been out of apartment for last 3 months  due to mold .  Wife has multiple myeloma and recent fungal infection.  She had to take antifungal infection .   Husband feels much better since leaving apartment.       Current Medications, Allergies, Complete Past Medical History, Past Surgical History, Family History, and Social History were reviewed in [employer] record.  ROS  The following are not active complaints unless bolded sore throat, dysphagia, dental problems, itching, sneezing,  nasal congestion or excess/ purulent secretions, ear ache,   fever, chills, sweats, unintended wt loss, classically pleuritic or exertional cp, hemoptysis,  orthopnea pnd or leg swelling, presyncope, palpitations, abdominal pain, anorexia, nausea, vomiting, diarrhea  or change in bowel or bladder habits, change in stools or urine, dysuria,hematuria,  rash, arthralgias, visual complaints, headache, numbness, weakness or ataxia or problems with walking or coordination,  change in mood/affect or memory.              Objective:   Physical Exam   amb hoarse bm nad obese Vitals:   11/24/15 1524  BP: 140/80  Pulse: 93  SpO2: 96%  Weight: 267 lb 12.8 oz (121.5 kg)  Height: 5' 11"  (1.803 m)      Vital signs reviewed    HEENT: nl  turbinates, and oropharynx. Nl external ear canals without cough reflex - edentulous    NECK :  without JVD/Nodes/TM/ nl carotid upstrokes bilaterally   LUNGS: no acc muscle use,  Nl contour chest with no def cough on insp or exp  CV:  RRR  no s3 or murmur or increase in P2, no edema   ABD:  soft and nontender with nl inspiratory excursion in the supine position. No bruits or organomegaly, bowel sounds nl  MS:  Nl gait/ ext warm without deformities, calf tenderness, cyanosis or clubbing No obvious joint restrictions   SKIN: warm and dry without lesions    NEURO:  alert, approp, nl sensorium with  no motor deficits      CTa Chest 09/19/15 Negative for pulmonary embolism.  No  acute abnormality in the chest. Negative for right upper lobe mass lesion.   Kathyrn Warmuth NP-C  [facility]  11/24/2015

## 2015-11-24 NOTE — Addendum Note (Signed)
Addended by: Osa Craver on: 11/24/2015 04:41 PM   Modules accepted: Orders

## 2015-11-29 ENCOUNTER — Telehealth: Payer: Self-pay | Admitting: Internal Medicine

## 2015-11-29 NOTE — Telephone Encounter (Signed)
No Proair samples available  LMTCB

## 2015-11-29 NOTE — Telephone Encounter (Signed)
Called and spoke with pt and he stated that he needs a sample of the proair.  He stated that he is on many different meds and he cannot afford all of these medications.  I advised the pt that we do not get samples of the proair HFA and that I could send in a refill for him. He declined at this time.  Nothing further is needed.

## 2016-03-25 ENCOUNTER — Emergency Department (HOSPITAL_COMMUNITY)
Admission: EM | Admit: 2016-03-25 | Discharge: 2016-03-25 | Disposition: A | Discharge status: Home / Self Care | Payer: Medicare Other | Attending: Emergency Medicine | Admitting: Emergency Medicine

## 2016-03-25 ENCOUNTER — Encounter (HOSPITAL_COMMUNITY): Payer: Self-pay

## 2016-03-25 ENCOUNTER — Emergency Department (HOSPITAL_COMMUNITY): Payer: Medicare Other

## 2016-03-25 DIAGNOSIS — Z87891 Personal history of nicotine dependence: Secondary | ICD-10-CM | POA: Insufficient documentation

## 2016-03-25 DIAGNOSIS — Z7982 Long term (current) use of aspirin: Secondary | ICD-10-CM | POA: Insufficient documentation

## 2016-03-25 DIAGNOSIS — I10 Essential (primary) hypertension: Secondary | ICD-10-CM | POA: Insufficient documentation

## 2016-03-25 DIAGNOSIS — R072 Precordial pain: Secondary | ICD-10-CM | POA: Insufficient documentation

## 2016-03-25 DIAGNOSIS — E119 Type 2 diabetes mellitus without complications: Secondary | ICD-10-CM | POA: Diagnosis not present

## 2016-03-25 DIAGNOSIS — Z794 Long term (current) use of insulin: Secondary | ICD-10-CM | POA: Insufficient documentation

## 2016-03-25 DIAGNOSIS — R112 Nausea with vomiting, unspecified: Secondary | ICD-10-CM | POA: Insufficient documentation

## 2016-03-25 DIAGNOSIS — R197 Diarrhea, unspecified: Secondary | ICD-10-CM | POA: Insufficient documentation

## 2016-03-25 DIAGNOSIS — Z79899 Other long term (current) drug therapy: Secondary | ICD-10-CM | POA: Diagnosis not present

## 2016-03-25 DIAGNOSIS — R079 Chest pain, unspecified: Secondary | ICD-10-CM

## 2016-03-25 LAB — I-STAT TROPONIN, ED
TROPONIN I, POC: 0.01 ng/mL (ref 0.00–0.08)
TROPONIN I, POC: 0.03 ng/mL (ref 0.00–0.08)

## 2016-03-25 LAB — BASIC METABOLIC PANEL
ANION GAP: 10 (ref 5–15)
BUN: 18 mg/dL (ref 6–20)
CALCIUM: 8.1 mg/dL — AB (ref 8.9–10.3)
CO2: 23 mmol/L (ref 22–32)
Chloride: 95 mmol/L — ABNORMAL LOW (ref 101–111)
Creatinine, Ser: 1.25 mg/dL — ABNORMAL HIGH (ref 0.61–1.24)
GFR, EST NON AFRICAN AMERICAN: 55 mL/min — AB (ref >60)
Glucose, Bld: 203 mg/dL — ABNORMAL HIGH (ref 65–99)
POTASSIUM: 3.6 mmol/L (ref 3.5–5.1)
SODIUM: 128 mmol/L — AB (ref 135–145)

## 2016-03-25 LAB — CBC
HEMATOCRIT: 43.5 % (ref 39.0–52.0)
HEMOGLOBIN: 14.6 g/dL (ref 13.0–17.0)
MCH: 30.4 pg (ref 26.0–34.0)
MCHC: 33.6 g/dL (ref 30.0–36.0)
MCV: 90.4 fL (ref 78.0–100.0)
Platelets: 208 10*3/uL (ref 150–400)
RBC: 4.81 MIL/uL (ref 4.22–5.81)
RDW: 13.2 % (ref 11.5–15.5)
WBC: 4.9 10*3/uL (ref 4.0–10.5)

## 2016-03-25 MED ORDER — SODIUM CHLORIDE 0.9 % IV BOLUS (SEPSIS)
1000.0000 mL | Freq: Once | INTRAVENOUS | Status: AC
  Administered 2016-03-25: 1000 mL via INTRAVENOUS

## 2016-03-25 MED ORDER — ONDANSETRON HCL 4 MG/2ML IJ SOLN
4.0000 mg | Freq: Once | INTRAMUSCULAR | Status: AC
  Administered 2016-03-25: 4 mg via INTRAVENOUS
  Filled 2016-03-25: qty 2

## 2016-03-25 MED ORDER — ONDANSETRON HCL 4 MG PO TABS: 4.0000 mg | ORAL_TABLET | Freq: Four times a day (QID) | ORAL | 0 refills | Status: DC

## 2016-03-25 MED ORDER — SODIUM CHLORIDE 0.9 % IV BOLUS (SEPSIS): 1000.0000 mL | Freq: Once | INTRAVENOUS | Status: DC

## 2016-03-25 NOTE — ED Triage Notes (Signed)
Patient complains of chest pressure since Friday. States that he is to be evaluated tomorrow by cardiology but concerned with the pressure is having x 2 days. No discomfort on arrival. Alert and oriented. Earlier today had nausea and shortness of breath with the discomfort

## 2016-03-25 NOTE — Discharge Instructions (Signed)
Please follow up with cardiologist tomorrow as previously scheduled for further evaluation of your chest pain.  Stay hydrated and take Zofran as needed for nausea.

## 2016-03-25 NOTE — ED Provider Notes (Signed)
Plains of anterior chest pain accompanied by 10 episodes of vomiting and 10 episodes of diarrhea onset 2 days ago. Chest pain is burning in nature and worse with drinking liquids. Not improved by anything. Also complains of generalized weakness onset 2 days ago. No other associated symptoms. No fever.Andrew Larsen He presently denies nausea. He feels thirsty and generally weak. Chest pain has improved since treatment here.. I doubt acute coronary syndrome. Story is highly atypical. No acute EKG changes and negative troponin after 48 hours of symptoms. Patient has follow-up appointment tomorrow 12 noon with cardiologist which was previously scheduled by his endocrinologist. He does have mild renal insufficiency which is chronic. Chest x-ray viewed by me   Orlie Dakin, MD 03/25/16 1616

## 2016-03-25 NOTE — ED Provider Notes (Signed)
[facility] DEPT Provider Note   CSN: [id number] Arrival date & time: 03/25/16  1312     History   Chief Complaint Chief Complaint  Patient presents with  . Chest Pain    HPI Andrew Larsen is a 75 y.o. male.  HPI   75 year old morbidly obese male with history of diabetes, GERD, hypertension, hypercholesterolemia presenting complaining of chest pain. Patient developed flulike symptoms 2 weeks ago and was on antibiotic for approximately week. He felt better. 3 days ago after having a for Thanksgiving dinner, the next day he reported having generalized weakness, feeling nauseous, vomited multiple times of nonbloody nonbilious vomitus, and having nonbloody non-mucousy loose stools. He also endorsed substernal chest discomfort which he thought may be related to heartburn. He tries taking sodas, and Pepto-Bismol with minimal relief. His nausea and vomiting has since improved but he still endorsed chest discomfort. He does have a schedule cardiology appointment tomorrow that was previously scheduled by his PCP but decided to come here today for further evaluation of his chest discomfort. He denies any prior history of heart attack. He did endorse some mild shortness of breath with this chest discomfort. No prior history of PE or DVT. No recent cardiac stress test.  Past Medical History:  Diagnosis Date  . Daytime somnolence   . Diabetes mellitus without complication ([place])   . Fracture    right fibula/wears boot cast  . GERD (gastroesophageal reflux disease)   . GERD (gastroesophageal reflux disease)   . Glaucoma   . Hypercholesteremia   . Hyperlipidemia   . Hypertension   . Mold exposure   . Reflux     Patient Active Problem List   Diagnosis Date Noted  . Morbid obesity ([place]) 09/21/2015  . Upper airway cough syndrome 09/20/2015  . Cough variant asthma 09/20/2015    Past Surgical History:  Procedure Laterality Date  . ORIF ANKLE FRACTURE Right 10/02/2012   Procedure: OPEN  REDUCTION INTERNAL FIXATION (ORIF) ANKLE FRACTURE;  Surgeon: Meredith Pel, MD;  Location: [facility]S;  Service: Orthopedics;  Laterality: Right;       Home Medications    Prior to Admission medications   Medication Sig Start Date End Date Taking? Authorizing Provider  albuterol (PROVENTIL HFA;VENTOLIN HFA) 108 (90 Base) MCG/ACT inhaler Inhale 2 puffs into the lungs every 4 (four) hours as needed for wheezing or shortness of breath. 11/24/15   Tammy S Parrett, NP  aspirin 81 MG chewable tablet Chew 81 mg by mouth daily. Reported on 09/05/2015    Historical Provider, MD  benzonatate (TESSALON) 200 MG capsule TAKE ONE CAPSULE BY MOUTH THREE TIMES DAILY AS NEEDED FOR COUGH Patient taking differently: TAKE ONE CAPSULE BY MOUTH EVERY 8 HOURS AS NEEDED COUGH 10/28/15   Tanda Rockers, MD  Brinzolamide-Brimonidine [facility]) 1-0.2 % SUSP Apply 1 drop to eye 2 (two) times daily.    Historical Provider, MD  Calcium Carbonate (CALCIUM 600 PO) Take by mouth. Take 2 capsules daily    Historical Provider, MD  cetirizine (ZYRTEC) 10 MG tablet Take 10 mg by mouth daily as needed (DRAINAGE).    Historical Provider, MD  Cholecalciferol (VITAMIN D3) 3000 units TABS Take 5,000 Units by mouth. Take 2 pills daily    Historical Provider, MD  docusate sodium (COLACE) 100 MG capsule Take 100 mg by mouth daily.    Historical Provider, MD  famotidine (PEPCID) 20 MG tablet Take 20 mg by mouth at bedtime.    Historical Provider, MD  gabapentin (NEURONTIN)  300 MG capsule TAKE 2 TABLETS BY MOUTH THREE TIMES A DAY    Historical Provider, MD  ibuprofen (ADVIL,MOTRIN) 800 MG tablet Take 800 mg by mouth every 8 (eight) hours as needed (JOINT PAIN).     Historical Provider, MD  insulin NPH-regular Human (NOVOLIN 70/30) (70-30) 100 UNIT/ML injection TAKE 42 UNITS EVERY MORNING AND 36 UNITS EVERY EVENING    Historical Provider, MD  latanoprost (XALATAN) 0.005 % ophthalmic solution Place 1 drop into both eyes at bedtime.     Historical Provider, MD  losartan (COZAAR) 50 MG tablet Take 50 mg by mouth every morning.    Historical Provider, MD  magnesium 30 MG tablet Take 30 mg by mouth daily.    Historical Provider, MD  metFORMIN (GLUCOPHAGE) 500 MG tablet TAKE 1 TABLET BY MOUTH EVERY MORNING AND 1/2 TABLET BY MOUTH EVERY EVENING    Historical Provider, MD  methocarbamol (ROBAXIN) 500 MG tablet Take 500 mg by mouth every 8 (eight) hours as needed for muscle spasms.    Historical Provider, MD  Multiple Vitamin (MULTIVITAMIN) capsule Take 1 capsule by mouth daily.     Historical Provider, MD  omeprazole (PRILOSEC) 40 MG capsule Take 40 mg by mouth 2 (two) times daily.     Historical Provider, MD  oxybutynin (DITROPAN) 5 MG tablet Take 5 mg by mouth 2 (two) times daily.    Historical Provider, MD  Potassium (POTASSIMIN PO) Take 1 tablet by mouth daily.     Historical Provider, MD  pravastatin (PRAVACHOL) 40 MG tablet Take 40 mg by mouth at bedtime.     Historical Provider, MD  tamsulosin (FLOMAX) 0.4 MG CAPS capsule Take 0.4 mg by mouth daily.    Historical Provider, MD  VITAMIN D, ERGOCALCIFEROL, PO Take by mouth.    Historical Provider, MD    Family History Family History  Problem Relation Age of Onset  . Heart disease Father   . Prostate cancer Brother   . Cancer Brother   . Diabetes Brother   . Colon cancer Neg Hx     Social History Social History  Substance Use Topics  . Smoking status: Former Smoker    Packs/day: 1.00    Years: 20.00    Quit date: 08/22/1981  . Smokeless tobacco: Not on file  . Alcohol use No     Allergies   Patient has no known allergies.   Review of Systems Review of Systems  All other systems reviewed and are negative.    Physical Exam Updated Vital Signs BP 145/75 (BP Location: Right Arm)   Pulse 76   Temp 97.7 F (36.5 C) (Oral)   Resp 18   SpO2 100%   Physical Exam  Constitutional: He appears well-developed and well-nourished. No distress.  HENT:  Head:  Atraumatic.  Eyes: Conjunctivae are normal.  Neck: Neck supple.  Cardiovascular: Normal rate and regular rhythm.   Pulmonary/Chest: Effort normal and breath sounds normal.  Abdominal: Soft. Bowel sounds are normal. He exhibits no distension. There is no tenderness.  Musculoskeletal: Normal range of motion. He exhibits no edema.  Neurological: He is alert.  Skin: No rash noted.  Psychiatric: He has a normal mood and affect.  Nursing note and vitals reviewed.    ED Treatments / Results  Labs (all labs ordered are listed, but only abnormal results are displayed) Labs Reviewed  BASIC METABOLIC PANEL - Abnormal; Notable for the following:       Result Value   Sodium 128 (*)  Chloride 95 (*)    Glucose, Bld 203 (*)    Creatinine, Ser 1.25 (*)    Calcium 8.1 (*)    GFR calc non Af Amer 55 (*)    All other components within normal limits  CBC  I-STAT TROPOININ, ED  I-STAT TROPOININ, ED    EKG  EKG Interpretation  Date/Time:  Sunday March 25 2016 13:16:53 EST Ventricular Rate:  81 PR Interval:  162 QRS Duration: 80 QT Interval:  384 QTC Calculation: 446 R Axis:   28 Text Interpretation:  Normal sinus rhythm Normal ECG No significant change since last tracing Confirmed by Winfred Leeds  MD, SAM [phone]) on 03/25/2016 2:04:03 PM       Radiology Dg Chest 2 View  Result Date: 03/25/2016 CLINICAL DATA:  Chest pressure and fever EXAM: CHEST  2 VIEW COMPARISON:  09/19/2015 FINDINGS: The heart size and mediastinal contours are within normal limits. Both lungs are clear. The visualized skeletal structures are unremarkable. IMPRESSION: No active cardiopulmonary disease. Electronically Signed   By: Inez Catalina M.D.   On: 03/25/2016 15:23    Procedures Procedures (including critical care time)  Medications Ordered in ED Medications  sodium chloride 0.9 % bolus 1,000 mL (not administered)  ondansetron (ZOFRAN) injection 4 mg (4 mg Intravenous Given 03/25/16 1452)  sodium  chloride 0.9 % bolus 1,000 mL (0 mLs Intravenous Stopped 03/25/16 1550)     Initial Impression / Assessment and Plan / ED Course  I have reviewed the triage vital signs and the nursing notes.  Pertinent labs & imaging results that were available during my care of the patient were reviewed by me and considered in my medical decision making (see chart for details).  Clinical Course     BP 156/78   Pulse 70   Temp 97.7 F (36.5 C) (Oral)   Resp 16   SpO2 96%    Final Clinical Impressions(s) / ED Diagnoses   Final diagnoses:  Nausea vomiting and diarrhea  Chest pain, unspecified type    New Prescriptions New Prescriptions   ONDANSETRON (ZOFRAN) 4 MG TABLET    Take 1 tablet (4 mg total) by mouth every 6 (six) hours.   2:09 PM Patient here with complaints of chest pain. Symptoms started after he has nausea vomiting diarrhea which I suspect may be a viral GI. Has history of heartburn, suspect chest pain secondary to heartburn. Low suspicion for PE. Workup initiated. HEART score of 5.  Care discussed with DR. Winfred Leeds.    4:02 PM sxs improves with GI cocktail.  Labs showing mild AKI with Cr 1.25.  CBG 203.  EKG without ischemic changes, Trop negative.  Will obtain delta trop.    4:56 PM Delta trop negative, labs are reassuring, CXR and EKG are unremarkable.  Pt felt much better after IVF.  No active CP.  Pt agrees to f/u with cardiology tomorrow as previously scheduled.  Return precaution discussed.     Domenic Moras, PA-C 03/25/16 [place], MD 03/25/16 [phone]

## 2016-03-25 NOTE — ED Notes (Signed)
PT ALERT NO COMPLAINTS

## 2016-03-25 NOTE — ED Notes (Signed)
IV FLUID INFUSED  MINIMAL CHEST FULLNESS

## 2016-03-26 ENCOUNTER — Ambulatory Visit: Payer: Self-pay | Admitting: Internal Medicine

## 2016-06-10 DIAGNOSIS — I739 Peripheral vascular disease, unspecified: Secondary | ICD-10-CM | POA: Diagnosis present

## 2016-06-10 NOTE — H&P (Signed)
OFFICE VISIT NOTES COPIED TO EPIC FOR DOCUMENTATION  . History of Present Illness Andrew Larsen [name] MD; 05/16/2016 4:27 PM) Patient words: Last OV 04/12/2016; FU echo, le duplex and nuc.  The patient is a 76 year old male who presents for a follow-up for Chest pain. and PAD. Andrew Larsen is 76 years old African-American male. He has history of feeling mild tightness in the precordium and shortness of breath on walking for about half block. Symptoms go away in 1-2 minutes. There is no radiation of pain to the arms, neck or back of the chest. No associated diaphoresis or nausea. Chest pain has been occasional since the last visit. No complaints of dyspnea at rest and no orthopnea or PND.  He has complaint of mild swelling on the legs. He also complains of pain in the left calf on walking even inside home.  No complaints of palpitation. He has mild dizziness on standing up. No history of near syncope or syncope.  Patient has hypertension, diabetes mellitus type 2 and hypercholesterolemia. He does not smoke.  No history of thyroid problems. No history of TIA or CVA.   Problem List/Past Medical Andrew Larsen; 05/15/2016 3:20 PM) Benign essential hypertension (I10)  Hyperlipidemia, group A (E78.00)  Chest pain (R07.9)  Lexiscan myoview stress test 05/04/2016: 1. The resting electrocardiogram demonstrated normal sinus rhythm, normal resting conduction and no resting arrhythmias. Stress EKG is non-diagnostic for ischemia as it a pharmacologic stress using Lexiscan. Stress symptoms included dyspnea. 2. The perfusion imaging study demonstrates soft tissue attenuation artifact in the inferior wall. There is no demonstrable ischemia or scar. LV systolic function was normal and calculated at 65%. This is a low risk study. Shortness of breath (R06.02)  Echocardiogram 05/03/2016: Poor apical viwes and wall motion with reduced sensitivity. Left ventricle cavity is normal in size. Mild concentric  hypertrophy of the left ventricle. Normal global wall motion. Doppler evidence of grade II (pseudonormal) diastolic dysfunction, elevated LAP. Calculated EF 55%. Trace tricuspid regurgitation. Unable to estimate PA pressure due to absence/minimal TR signal. The aortic root is normal in size. Mild atherosclerotic changes in the aorta. Claudication (I73.9)  Lower extremity arterial duplex 05/03/2016: Moderate velocity increase at the right anterior tibial artery suggests > 50% stenosis. Biphasic waveforms in the distal SFA and popliteal vessels suggests diffuse disease. Moderate velocity increase at the left mid popliteal artery suggests >50% stenosis. There is monophasic waveforms throughout the distal SFA and below the knee suggests severe diffuse disease or near occlusion. Left AT appears occluded. This exam reveals mildly decreased perfusion of the right lower extremity, with RABI 0.83 and severely decreased perfusion of the left lower extremity, with LABI 0.40 noted at the post tibial artery level. Obesity (BMI 30-39.9) (E66.9)  Uncontrolled type 2 diabetes mellitus with complication, with long-term current use of insulin (E11.8, E11.65)  Glaucoma (H40.9)  GERD (gastroesophageal reflux disease) (K21.9)   Allergies Andrew Larsen; 05/15/2016 3:20 PM) No Known Allergies 04/12/2016  Family History Andrew Larsen; 05/15/2016 3:20 PM) Mother  Living, HTN, no known heart conditions Andrew Larsen  Deceased. at age 36 MI, HTN Andrew Larsen 1  younger, no known heart conditions  Social History Andrew Larsen; 05/15/2016 3:20 PM) Current tobacco use  Former smoker. quit about 35 years ago, smoked for 10 years Non Drinker/No Alcohol Use  Marital status  Married. Living Situation  Lives with spouse. Number of Children  1.  Past Surgical History Andrew Larsen; 05/15/2016 3:20 PM) Arthroscopic Ankle Surgery - Both  Arthroscopic Knee Surgery -  Right   Medication History Andrew Malta  Larsen; 05/15/2016 3:27 PM) ReliOn 70/30 ((70-30) 100UNIT/ML Suspension, 65u in am, 60u in pm Subcutaneous twice a day) Active. Gabapentin (300MG  Capsule, 1 Oral three times daily) Active. Omeprazole (40MG  Capsule DR, 1 Oral daily) Active. Losartan Potassium (50MG  Tablet, 1 Oral daily) Active. Pravastatin Sodium (40MG  Tablet, 1 Oral daily) Active. Triamterene/HCTZ (37.5-25MG  Capsule, 1 Oral daily) Active. Combigan (0.2-0.5% Solution, 1 drop in eye Ophthalmic twice a day) Active. Latanoprost (0.005% Solution, 1 drop eye Ophthalmic daily) Active. Oxybutynin Chloride (5MG  Tablet, 1 Oral two times daily) Active. Tamsulosin HCl (0.4MG  Capsule, 1 Oral 30 mins after the same meal daily) Active. Centrum Silver (1 Oral daily) Active. Aspirin (81MG  Tablet DR, 1 Oral daily) Active. Vitamin D3 (5000UNIT Capsule, 2 Oral daily) Active. Potassium (1 Oral daily) Specific dose unknown - Active. Magnesium (500MG  Tablet, 1 Oral daily) Active. Albuterol Sulfate HFA (108 (90 Base)MCG/ACT Aerosol Soln, Inhalation as needed) Active. Calcium Gluconate (600MG  Tablet, 2 Oral daily) Active. Fish Oil + D3 (1000-1000MG -UNIT Capsule, 1 Oral daily) Discontinued: per pt. Ibuprofen (800MG  Tablet, Oral as needed) Active. Dulera (100-5MCG/ACT Aerosol, Inhalation as needed) Active. Multivitamin Adults (1 Oral daily) Active. Januvia (100MG  Tablet, 1 Oral daily) Active. Medications Reconciled (List present)  Diagnostic Studies History Andrew Larsen; 05/15/2016 9:17 AM) Echocardiogram 05/03/2016 Poor apical viwes and wall motion with reduced sensitivity. Left ventricle cavity is normal in size. Mild concentric hypertrophy of the left ventricle. Normal global wall motion. Doppler evidence of grade II (pseudonormal) diastolic dysfunction, elevated LAP. Calculated EF 55%. Trace tricuspid regurgitation. Unable to estimate PA pressure due to absence/minimal TR signal. The aortic root is normal in size.  Mild atherosclerotic changes in the aorta. Nuclear stress test 05/04/2016 1. The resting electrocardiogram demonstrated normal sinus rhythm, normal resting conduction and no resting arrhythmias. Stress EKG is non-diagnostic for ischemia as it a pharmacologic stress using Lexiscan. Stress symptoms included dyspnea. 2. The perfusion imaging study demonstrates soft tissue attenuation artifact in the inferior wall. There is no demonstrable ischemia or scar. LV systolic function was normal and calculated at 65%. This is a low risk study. Lower Extremity Dopplers 05/03/2016 Moderate velocity increase at the right anterior tibial artery suggests > 50% stenosis. Biphasic waveforms in the distal SFA and popliteal vessels suggests diffuse disease. Moderate velocity increase at the left mid popliteal artery suggests >50% stenosis. There is monophasic waveforms throughout the distal SFA and below the knee suggests severe diffuse disease or near occlusion. Left AT appears occluded. This exam reveals mildly decreased perfusion of the right lower extremity, with RABI 0.83 and severely decreased perfusion of the left lower extremity, with LABI 0.40 noted at the post tibial artery level.    Review of Systems Andrew Larsen [name] MD; 05/16/2016 4:34 PM)  Note: GENERAL- Feels tired, No fever, chills. No recent weight change. CARDIO VASCULAR- Has chest discomfort, Has shortness of breath, No orthopnea or PND. No palpitation, Has dizziness, No fainting. Has hypertension and h/o high cholesterol. Has swelling on legs. Has claudication in left leg, No cramps. No h/o DVT PULMONARY- No cough, phlegm, wheezing, not feeling congested in chest. GASTROINTESTINAL- No abdominal pain, nausea, vomiting or diarrhea. No dark tarry stools. Normal appetite. No heartburn. ENDOCRINE- No Thyroid problem, No feeling of excessive heat or cold, No polydipsia or polyuria. Has Diabetes. NEUROLOGICAL- No focal motor or sensory symptoms, Good  coordination. No seizures. MUSCULOSKELETAL- No generalized myalgias or muscle weakness. No joint swelling SKIN- No skin rash, No pruritus HEMATOLOGY- No anemia, petechiae, excessive  bruising, epistaxis, GI bleed or any abnormal bleeding.   Vitals Andrew Larsen; 05/15/2016 3:29 PM) 05/15/2016 3:24 PM Weight: 270.56 lb Height: 71in Body Surface Area: 2.4 m Body Mass Index: 37.74 kg/m  Pulse: 77 (Regular)  P.OX: 91% (Room air) BP: 110/60 (Sitting, Left Arm, Standard)       Physical Exam Andrew Larsen [name] MD; 05/16/2016 4:37 PM) The physical exam findings are as follows: Note:GENERAL APPEARANCE- Alert, Oriented. Well built, very obese. HEENT- Unremarkable, fundi were not examined. NECK- No JVD. Carotid pulses are 2+, No bruits audible. No thyromegaly. No lymphadenopathy. HEART- Auscultation- Normal S1, S2. No gallops or murmurs audible. CHEST- Normal shape. Normal percussion. Auscultation- Normal breath sounds, No crepitations. No wheezing. ABDOMEN- Very obese. Palpation- Soft, Nontender. No hepatosplenomegaly. No masses felt. Auscultation- No bruits audible. Examination is limited due to severe obesity. EXTREMITIES- No Clubbing or Cyanosis. 1+ edema on legs and feet. Skin on the feet is dry. PERIPHERAL PULSES- Both femoral pulses- 2+, No bruits audible. Both dorsalis pedis pulses- Not palpable, Both posterior tibial pulses- Not palpable. Palpation was difficult due to edema.    Assessment & Plan Andrew Larsen [name] MD; 05/16/2016 4:39 PM) PAD (peripheral artery disease) (I73.9) Story: Lower extremity arterial duplex 05/03/2016: Moderate velocity increase at the right anterior tibial artery suggests > 50% stenosis. Biphasic waveforms in the distal SFA and popliteal vessels suggests diffuse disease. Moderate velocity increase at the left mid popliteal artery suggests >50% stenosis. There is monophasic waveforms throughout the distal SFA and below the knee suggests severe  diffuse disease or near occlusion. Left AT appears occluded. This exam reveals mildly decreased perfusion of the right lower extremity, with RABI 0.83 and severely decreased perfusion of the left lower extremity, with LABI 0.40 noted at the post tibial artery level. Current Plans Started Cilostazol 100MG , 1 (one) Tablet two times daily, #60, 30 days starting 05/15/2016, Ref. x3. Future Plans [id number]: METABOLIC PANEL, BASIC ([id number]) - one time 06/04/2016: CBC & PLATELETS (AUTO) [id number]) - one time Chest pain (R07.9) Story: Lexiscan myoview stress test 05/04/2016: 1. The resting electrocardiogram demonstrated normal sinus rhythm, normal resting conduction and no resting arrhythmias. Stress EKG is non-diagnostic for ischemia as it a pharmacologic stress using Lexiscan. Stress symptoms included dyspnea. 2. The perfusion imaging study demonstrates soft tissue attenuation artifact in the inferior wall. There is no demonstrable ischemia or scar. LV systolic function was normal and calculated at 65%. This is a low risk study. Future Plans 06/04/2016: PT (PROTHROMBIN TIME) (13086) - one time Shortness of breath (R06.02) Story: Echocardiogram 05/03/2016: Poor apical viwes and wall motion with reduced sensitivity. Left ventricle cavity is normal in size. Mild concentric hypertrophy of the left ventricle. Normal global wall motion. Doppler evidence of grade II (pseudonormal) diastolic dysfunction, elevated LAP. Calculated EF 55%. Trace tricuspid regurgitation. Unable to estimate PA pressure due to absence/minimal TR signal. The aortic root is normal in size. Mild atherosclerotic changes in the aorta. Benign essential hypertension (I10) Hyperlipidemia, group A (E78.00) Obesity (BMI 30-39.9) (E66.9)  Note:Results of Lexiscan Myoview scans, echocardiogram and lower extremity arterial duplex study were explained to the patient. Stress nuclear scans are negative for ischemia.  In view of symptoms of significant  claudication and results of arterial duplex study, I have recommended lower extremity peripheral vascular angiogram. Indications, procedure and possible complications were explained to the patient. He wants to proceed. I have added cilostazol for PAD.  His blood pressure is controlled with therapy, I have advised him to continue present medications for  blood pressure, cholesterol and continue low-dose aspirin.  Primary prevention was again discussed. He was advised to follow strict ADA, low-salt, low-cholesterol diet. He was also advised calorie restriction to lose weight.  He will return for follow up after the PV angiogram.  CC; Dr. Velna Hatchet; Dr. Hassan Buckler.  Signed electronically by Despina Hick, MD (05/16/2016 4:40 PM)

## 2016-06-11 ENCOUNTER — Encounter (HOSPITAL_COMMUNITY): Payer: Self-pay | Admitting: Cardiology

## 2016-06-12 ENCOUNTER — Ambulatory Visit (HOSPITAL_COMMUNITY)
Admission: RE | Admit: 2016-06-12 | Discharge: 2016-06-12 | Disposition: A | Discharge status: Home / Self Care | Payer: Medicare Other | Source: Ambulatory Visit | Attending: Cardiology | Admitting: Cardiology

## 2016-06-12 ENCOUNTER — Encounter (HOSPITAL_COMMUNITY)
Admission: RE | Disposition: A | Discharge status: Home / Self Care | Payer: Self-pay | Source: Ambulatory Visit | Attending: Cardiology

## 2016-06-12 DIAGNOSIS — H409 Unspecified glaucoma: Secondary | ICD-10-CM | POA: Diagnosis not present

## 2016-06-12 DIAGNOSIS — I70212 Atherosclerosis of native arteries of extremities with intermittent claudication, left leg: Secondary | ICD-10-CM | POA: Diagnosis not present

## 2016-06-12 DIAGNOSIS — K219 Gastro-esophageal reflux disease without esophagitis: Secondary | ICD-10-CM | POA: Diagnosis not present

## 2016-06-12 DIAGNOSIS — Z6837 Body mass index (BMI) 37.0-37.9, adult: Secondary | ICD-10-CM | POA: Insufficient documentation

## 2016-06-12 DIAGNOSIS — E7800 Pure hypercholesterolemia, unspecified: Secondary | ICD-10-CM | POA: Insufficient documentation

## 2016-06-12 DIAGNOSIS — I739 Peripheral vascular disease, unspecified: Secondary | ICD-10-CM | POA: Diagnosis present

## 2016-06-12 DIAGNOSIS — E1165 Type 2 diabetes mellitus with hyperglycemia: Secondary | ICD-10-CM | POA: Insufficient documentation

## 2016-06-12 DIAGNOSIS — Z794 Long term (current) use of insulin: Secondary | ICD-10-CM | POA: Insufficient documentation

## 2016-06-12 DIAGNOSIS — Z87891 Personal history of nicotine dependence: Secondary | ICD-10-CM | POA: Diagnosis not present

## 2016-06-12 DIAGNOSIS — Z7982 Long term (current) use of aspirin: Secondary | ICD-10-CM | POA: Insufficient documentation

## 2016-06-12 DIAGNOSIS — E1151 Type 2 diabetes mellitus with diabetic peripheral angiopathy without gangrene: Secondary | ICD-10-CM | POA: Diagnosis not present

## 2016-06-12 DIAGNOSIS — I7092 Chronic total occlusion of artery of the extremities: Secondary | ICD-10-CM | POA: Diagnosis not present

## 2016-06-12 DIAGNOSIS — I10 Essential (primary) hypertension: Secondary | ICD-10-CM | POA: Insufficient documentation

## 2016-06-12 DIAGNOSIS — Z8249 Family history of ischemic heart disease and other diseases of the circulatory system: Secondary | ICD-10-CM | POA: Insufficient documentation

## 2016-06-12 HISTORY — PX: PERIPHERAL VASCULAR INTERVENTION: CATH118257

## 2016-06-12 HISTORY — PX: PERIPHERAL VASCULAR ATHERECTOMY: CATH118256

## 2016-06-12 HISTORY — PX: LOWER EXTREMITY ANGIOGRAPHY: CATH118251

## 2016-06-12 LAB — POCT ACTIVATED CLOTTING TIME
Activated Clotting Time: 175 seconds
Activated Clotting Time: 307 seconds

## 2016-06-12 LAB — GLUCOSE, CAPILLARY: Glucose-Capillary: 161 mg/dL — ABNORMAL HIGH (ref 65–99)

## 2016-06-12 SURGERY — LOWER EXTREMITY ANGIOGRAPHY
Anesthesia: LOCAL

## 2016-06-12 MED ORDER — SODIUM CHLORIDE 0.9 % IV SOLN
INTRAVENOUS | Status: DC
  Administered 2016-06-12: 11:00:00 via INTRAVENOUS

## 2016-06-12 MED ORDER — SODIUM CHLORIDE 0.9 % IV BOLUS (SEPSIS)
500.0000 mL | Freq: Once | INTRAVENOUS | Status: AC
  Administered 2016-06-12: 1000 mL via INTRAVENOUS

## 2016-06-12 MED ORDER — FENTANYL CITRATE (PF) 100 MCG/2ML IJ SOLN
INTRAMUSCULAR | Status: AC
  Filled 2016-06-12: qty 2

## 2016-06-12 MED ORDER — ACETAMINOPHEN 325 MG PO TABS
325.0000 mg | ORAL_TABLET | ORAL | Status: DC | PRN
  Administered 2016-06-12: 650 mg via ORAL
  Filled 2016-06-12 (×2): qty 2

## 2016-06-12 MED ORDER — ACETAMINOPHEN 325 MG PO TABS
ORAL_TABLET | ORAL | Status: AC
  Filled 2016-06-12: qty 2

## 2016-06-12 MED ORDER — VERAPAMIL HCL 2.5 MG/ML IV SOLN
INTRAVENOUS | Status: AC
  Filled 2016-06-12: qty 2

## 2016-06-12 MED ORDER — LOSARTAN POTASSIUM 50 MG PO TABS: 50.0000 mg | ORAL_TABLET | Freq: Every morning | ORAL | Status: DC

## 2016-06-12 MED ORDER — ONDANSETRON HCL 4 MG/2ML IJ SOLN: 4.0000 mg | Freq: Four times a day (QID) | INTRAMUSCULAR | Status: DC | PRN

## 2016-06-12 MED ORDER — MIDAZOLAM HCL 2 MG/2ML IJ SOLN
INTRAMUSCULAR | Status: AC
Start: 2016-06-12 — End: 2016-06-12
  Filled 2016-06-12: qty 2

## 2016-06-12 MED ORDER — HEPARIN SODIUM (PORCINE) 1000 UNIT/ML IJ SOLN
INTRAMUSCULAR | Status: AC
  Filled 2016-06-12: qty 1

## 2016-06-12 MED ORDER — HEPARIN (PORCINE) IN NACL 2-0.9 UNIT/ML-% IJ SOLN
INTRAMUSCULAR | Status: AC
  Filled 2016-06-12: qty 1000

## 2016-06-12 MED ORDER — NITROGLYCERIN 1 MG/10 ML FOR IR/CATH LAB
INTRA_ARTERIAL | Status: DC | PRN
  Administered 2016-06-12: 200 ug via INTRA_ARTERIAL

## 2016-06-12 MED ORDER — CLOPIDOGREL BISULFATE 300 MG PO TABS
ORAL_TABLET | ORAL | Status: DC | PRN
  Administered 2016-06-12: 600 mg via ORAL

## 2016-06-12 MED ORDER — HYDRALAZINE HCL 20 MG/ML IJ SOLN: 5.0000 mg | INTRAMUSCULAR | Status: DC | PRN

## 2016-06-12 MED ORDER — LIDOCAINE HCL (PF) 1 % IJ SOLN
INTRAMUSCULAR | Status: AC
  Filled 2016-06-12: qty 30

## 2016-06-12 MED ORDER — HEPARIN (PORCINE) IN NACL 2-0.9 UNIT/ML-% IJ SOLN
INTRAMUSCULAR | Status: DC | PRN
  Administered 2016-06-12: 1000 mL

## 2016-06-12 MED ORDER — HEPARIN SODIUM (PORCINE) 1000 UNIT/ML IJ SOLN
INTRAMUSCULAR | Status: DC | PRN
  Administered 2016-06-12: 1500 [IU] via INTRAVENOUS
  Administered 2016-06-12: 4000 [IU] via INTRAVENOUS
  Administered 2016-06-12: 8000 [IU] via INTRAVENOUS

## 2016-06-12 MED ORDER — ALUM & MAG HYDROXIDE-SIMETH 200-200-20 MG/5ML PO SUSP
15.0000 mL | ORAL | Status: DC | PRN
  Filled 2016-06-12: qty 30

## 2016-06-12 MED ORDER — SODIUM CHLORIDE 0.9 % IV SOLN: 250.0000 mL | INTRAVENOUS | Status: DC | PRN

## 2016-06-12 MED ORDER — MAGNESIUM SULFATE 2 GM/50ML IV SOLN
2.0000 g | Freq: Every day | INTRAVENOUS | Status: DC | PRN
  Filled 2016-06-12: qty 50

## 2016-06-12 MED ORDER — SODIUM CHLORIDE 0.9 % IV SOLN: 1.0000 mL/kg/h | INTRAVENOUS | Status: DC

## 2016-06-12 MED ORDER — HEPARIN SODIUM (PORCINE) 1000 UNIT/ML IJ SOLN
INTRAMUSCULAR | Status: AC
Start: 2016-06-12 — End: 2016-06-12
  Filled 2016-06-12: qty 1

## 2016-06-12 MED ORDER — NITROGLYCERIN IN D5W 200-5 MCG/ML-% IV SOLN
INTRAVENOUS | Status: AC
  Filled 2016-06-12: qty 250

## 2016-06-12 MED ORDER — ACETAMINOPHEN 325 MG RE SUPP
325.0000 mg | RECTAL | Status: DC | PRN
  Filled 2016-06-12: qty 2

## 2016-06-12 MED ORDER — MIDAZOLAM HCL 2 MG/2ML IJ SOLN
INTRAMUSCULAR | Status: DC | PRN
  Administered 2016-06-12: 2 mg via INTRAVENOUS

## 2016-06-12 MED ORDER — SODIUM CHLORIDE 0.9% FLUSH: 3.0000 mL | Freq: Two times a day (BID) | INTRAVENOUS | Status: DC

## 2016-06-12 MED ORDER — CEFAZOLIN IN D5W 1 GM/50ML IV SOLN: 1.0000 g | INTRAVENOUS | Status: DC

## 2016-06-12 MED ORDER — FENTANYL CITRATE (PF) 100 MCG/2ML IJ SOLN
INTRAMUSCULAR | Status: DC | PRN
  Administered 2016-06-12: 25 ug via INTRAVENOUS
  Administered 2016-06-12: 50 ug via INTRAVENOUS

## 2016-06-12 MED ORDER — LABETALOL HCL 5 MG/ML IV SOLN: 10.0000 mg | INTRAVENOUS | Status: DC | PRN

## 2016-06-12 MED ORDER — SODIUM CHLORIDE 0.9% FLUSH: 3.0000 mL | INTRAVENOUS | Status: DC | PRN

## 2016-06-12 MED ORDER — LIDOCAINE HCL (PF) 1 % IJ SOLN
INTRAMUSCULAR | Status: DC | PRN
  Administered 2016-06-12: 20 mL

## 2016-06-12 MED ORDER — CLOPIDOGREL BISULFATE 300 MG PO TABS
ORAL_TABLET | ORAL | Status: AC
  Filled 2016-06-12: qty 2

## 2016-06-12 MED ORDER — IODIXANOL 320 MG/ML IV SOLN
INTRAVENOUS | Status: DC | PRN
  Administered 2016-06-12: 130 mL via INTRA_ARTERIAL

## 2016-06-12 SURGICAL SUPPLY — 29 items
BALLN IN.PACT DCB 5X120 (BALLOONS) ×3
BALLN MUSTANG 5X100X135 (BALLOONS) ×3
BALLOON MUSTANG 5X100X135 (BALLOONS) ×2 IMPLANT
CATH CXI SUPP ANG 2.6FR 150CM (MICROCATHETER) ×3 IMPLANT
CATH OMNI FLUSH 5F 65CM (CATHETERS) ×3 IMPLANT
COVER PRB 48X5XTLSCP FOLD TPE (BAG) ×2 IMPLANT
COVER PROBE 5X48 (BAG) ×1
DCB IN.PACT 5X120 (BALLOONS) ×2 IMPLANT
DEVICE CLOSURE PERCLS PRGLD 6F (VASCULAR PRODUCTS) ×2 IMPLANT
DIAMONDBACK SOLID OAS 1.5MM (CATHETERS) ×3
KIT ENCORE 26 ADVANTAGE (KITS) ×3 IMPLANT
KIT PV (KITS) ×3 IMPLANT
LUBRICANT VIPERSLIDE CORONARY (MISCELLANEOUS) ×3 IMPLANT
PERCLOSE PROGLIDE 6F (VASCULAR PRODUCTS) ×3
SHEATH HIGHFLEX ANSEL 7FR 55CM (SHEATH) ×3 IMPLANT
SHEATH PINNACLE 5F 10CM (SHEATH) ×3 IMPLANT
STENT ZILVER PTX 6X120 (Permanent Stent) ×3 IMPLANT
STOPCOCK MORSE 400PSI 3WAY (MISCELLANEOUS) ×3 IMPLANT
SYRINGE MEDRAD AVANTA MACH 7 (SYRINGE) ×3 IMPLANT
SYSTEM DIMNDBCK SLD OAS 1.5MM (CATHETERS) ×2 IMPLANT
TAPE RADIOPAQUE TURBO (MISCELLANEOUS) ×3 IMPLANT
TRANSDUCER W/STOPCOCK (MISCELLANEOUS) ×3 IMPLANT
TRAY PV CATH (CUSTOM PROCEDURE TRAY) ×3 IMPLANT
TUBING CIL FLEX 10 FLL-RA (TUBING) ×3 IMPLANT
WIRE APPROACH CTO .014 25G 135 (WIRE) ×3 IMPLANT
WIRE APROACH 18G .014X300CM (WIRE) ×3 IMPLANT
WIRE HITORQ VERSACORE ST 145CM (WIRE) ×3 IMPLANT
WIRE MINI STICK MAX (SHEATH) ×3 IMPLANT
WIRE VIPER ADVANCE .017X335CM (WIRE) ×3 IMPLANT

## 2016-06-12 NOTE — Progress Notes (Signed)
Report received from [facility]

## 2016-06-12 NOTE — Progress Notes (Deleted)
Assumed care of pt from Debbie Hedge, RN.  Assessment documented. 

## 2016-06-12 NOTE — Progress Notes (Signed)
Client up to bathroom and c/o right groin soreness; firmness noted right groin and pressure held for 7 min and right groin softer; Dr Einar Gip notified and in to see client and per Dr Einar Gip ok to d/c home

## 2016-06-12 NOTE — Interval H&P Note (Signed)
History and Physical Interval Note:  06/12/2016 10:24 AM  Andrew Larsen  has presented today for surgery, with the diagnosis of pad with claudication  The various methods of treatment have been discussed with the patient and family. After consideration of risks, benefits and other options for treatment, the patient has consented to  Procedure(s): Lower Extremity Angiography (N/A) and possible PTA as a surgical intervention .  The patient's history has been reviewed, patient examined, no change in status, stable for surgery.  I have reviewed the patient's chart and labs.  Questions were answered to the patient's satisfaction.     Adrian Prows

## 2016-06-12 NOTE — Progress Notes (Deleted)
Andrew Larsen, [place] to call in Rx for NTG.

## 2016-06-12 NOTE — Discharge Instructions (Signed)
Femoral Site Care °Introduction °Refer to this sheet in the next few weeks. These instructions provide you with information about caring for yourself after your procedure. Your health care provider may also give you more specific instructions. Your treatment has been planned according to current medical practices, but problems sometimes occur. Call your health care provider if you have any problems or questions after your procedure. °What can I expect after the procedure? °After your procedure, it is typical to have the following: °· Bruising at the site that usually fades within 1-2 weeks. °· Blood collecting in the tissue (hematoma) that may be painful to the touch. It should usually decrease in size and tenderness within 1-2 weeks. °Follow these instructions at home: °· Take medicines only as directed by your health care provider. °· You may shower 24-48 hours after the procedure or as directed by your health care provider. Remove the bandage (dressing) and gently wash the site with plain soap and water. Pat the area dry with a clean towel. Do not rub the site, because this may cause bleeding. °· Do not take baths, swim, or use a hot tub until your health care provider approves. °· Check your insertion site every day for redness, swelling, or drainage. °· Do not apply powder or lotion to the site. °· Limit use of stairs to twice a day for the first 2-3 days or as directed by your health care provider. °· Do not squat for the first 2-3 days or as directed by your health care provider. °· Do not lift over 10 lb (4.5 kg) for 5 days after your procedure or as directed by your health care provider. °· Ask your health care provider when it is okay to: °¨ Return to work or school. °¨ Resume usual physical activities or sports. °¨ Resume sexual activity. °· Do not drive home if you are discharged the same day as the procedure. Have someone else drive you. °· You may drive 24 hours after the procedure unless otherwise  instructed by your health care provider. °· Do not operate machinery or power tools for 24 hours after the procedure or as directed by your health care provider. °· If your procedure was done as an outpatient procedure, which means that you went home the same day as your procedure, a responsible adult should be with you for the first 24 hours after you arrive home. °· Keep all follow-up visits as directed by your health care provider. This is important. °Contact a health care provider if: °· You have a fever. °· You have chills. °· You have increased bleeding from the site. Hold pressure on the site. °Get help right away if: °· You have unusual pain at the site. °· You have redness, warmth, or swelling at the site. °· You have drainage (other than a small amount of blood on the dressing) from the site. °· The site is bleeding, and the bleeding does not stop after 30 minutes of holding steady pressure on the site. °· Your leg or foot becomes pale, cool, tingly, or numb. °This information is not intended to replace advice given to you by your health care provider. Make sure you discuss any questions you have with your health care provider. °Document Released: 12/18/2013 Document Revised: 09/22/2015 Document Reviewed: 11/03/2013 °© 2017 [employer] ° °

## 2016-06-13 ENCOUNTER — Encounter (HOSPITAL_COMMUNITY): Payer: Self-pay | Admitting: Cardiology

## 2016-06-13 MED FILL — Nitroglycerin IV Soln 200 MCG/ML in D5W: INTRAVENOUS | Qty: 250 | Status: AC

## 2016-06-13 MED FILL — Verapamil HCl IV Soln 2.5 MG/ML: INTRAVENOUS | Qty: 2 | Status: AC

## 2016-06-18 ENCOUNTER — Encounter (HOSPITAL_COMMUNITY): Payer: Self-pay | Admitting: Cardiology

## 2016-06-18 MED ORDER — VIPERSLIDE LUBRICANT OPTIME
TOPICAL | Status: DC | PRN
  Administered 2016-06-12: 11:00:00 via SURGICAL_CAVITY

## 2016-08-10 ENCOUNTER — Encounter: Payer: Self-pay | Admitting: Physical Therapy

## 2016-08-10 ENCOUNTER — Ambulatory Visit: Payer: Medicare Other | Attending: Internal Medicine | Admitting: Physical Therapy

## 2016-08-10 DIAGNOSIS — R2689 Other abnormalities of gait and mobility: Secondary | ICD-10-CM | POA: Diagnosis present

## 2016-08-10 DIAGNOSIS — M6281 Muscle weakness (generalized): Secondary | ICD-10-CM | POA: Insufficient documentation

## 2016-08-10 DIAGNOSIS — R2681 Unsteadiness on feet: Secondary | ICD-10-CM | POA: Insufficient documentation

## 2016-08-10 NOTE — Therapy (Cosign Needed)
[facility] [address] [place], [place], [address] Phone: [phone]   Fax:  [phone]  Physical Therapy Evaluation  Patient Details  Name: Andrew Larsen MRN: [id number] Date of Birth: May 04, 1940 Referring Provider: Einar Gip  Encounter Date: 08/10/2016      PT End of Session - 08/10/16 1233    Visit Number 1   Number of Visits 17   Date for PT Re-Evaluation 10/05/16   Authorization Type [employer]    PT Start Time 1104   PT Stop Time 1155   PT Time Calculation (min) 51 min   Equipment Utilized During Treatment Gait belt   Activity Tolerance Patient tolerated treatment well   Behavior During Therapy WFL for tasks assessed/performed      Past Medical History:  Diagnosis Date  . Daytime somnolence   . Diabetes mellitus without complication ([place])   . Fracture    right fibula/wears boot cast  . GERD (gastroesophageal reflux disease)   . Glaucoma   . Hypercholesteremia   . Hyperlipidemia   . Hypertension   . Mold exposure     Past Surgical History:  Procedure Laterality Date  . LOWER EXTREMITY ANGIOGRAPHY N/A 06/12/2016   Procedure: Lower Extremity Angiography;  Surgeon: Adrian Prows, MD;  Location: [facility];  Service: Cardiovascular;  Laterality: N/A;  . ORIF ANKLE FRACTURE Right 10/02/2012   Procedure: OPEN REDUCTION INTERNAL FIXATION (ORIF) ANKLE FRACTURE;  Surgeon: Meredith Pel, MD;  Location: [facility];  Service: Orthopedics;  Laterality: Right;  . PERIPHERAL VASCULAR ATHERECTOMY Left 06/12/2016   Procedure: Peripheral Vascular Atherectomy-Left Popliteal;  Surgeon: Adrian Prows, MD;  Location: [facility];  Service: Cardiovascular;  Laterality: Left;  . PERIPHERAL VASCULAR INTERVENTION Left 06/12/2016   Procedure: Peripheral Vascular Intervention- DCB Left Popliteal;  Surgeon: Adrian Prows, MD;  Location: [facility];  Service: Cardiovascular;  Laterality: Left;    There  were no vitals filed for this visit.       Subjective Assessment - 08/10/16 1109    Subjective Patient is a 76 year old male who walks into PT with a cane. Patient is s/p a L popliteal stent (06/12/2016), and expected his LE swelling to decrease, but he reports he does not feel that the swelling has decreased since the stent was placed. Patient reports multiple falls prior to the stent, which has continued (3 since surgery in February). Patient reports his knees often buckle causing him to fall. Patient reports R > L leg weakness. He reports a recent fall when stepping off a curb. He has sustained multiple injuies due to falling including 2 falls in 2014 resulting in bilateral ankle injuries (injured R ankle due to one fall, followed by injuring the L ankle), R fibula injury wihtin the past 6 months.    Patient is accompained by: Family member   Pertinent History PAD, HTN, hypercholesterolemia, chest pain, dyspnea, glaucoma, GERD, bilateral ankle injuries due to falling, R fibula fracture    Limitations Standing;Walking;House hold activities   Patient Stated Goals to stop falling    Currently in Pain? Other (Comment)  occasional complaints of L knee pain, and occasional radiating pain from low back towards the L knee, which incresaed after the stent            [facility] PT Assessment - 08/10/16 1117      Assessment   Medical Diagnosis PAD   Referring Provider Ganji   Onset Date/Surgical Date 06/12/16  pt reports falling > 1 year    Prior Therapy Yes - approximately 3 months ago. Outpatient PT to rehab s/p fx from a fall.     Precautions   Precautions Fall   Precaution Comments patient reports approximately 6 falls in the past 6 months. Patient reports he has a strong fear of falling.     Balance Screen   Has the patient fallen in the past 6 months Yes   How many times? 6  3 falls since stent was placed in February    Has the patient had a decrease in activity level because of a fear of  falling?  Yes   Is the patient reluctant to leave their home because of a fear of falling?  Yes     [place] residence   Living Arrangements Spouse/significant other   Available Help at Discharge Family  wife reports having terminal cancer   Type of [place] to enter   Entrance Stairs-Number of Steps 3   Entrance Stairs-Rails None   Home Layout One level   [place] - single point     Prior Function   Level of Independence Independent  uses cane outside the home    Leisure go out to eat, participation in church - singing in choir is difficult due to having to climb stairs     Observation/Other Assessments   Focus on Therapeutic Outcomes (FOTO)  FOTO intake status 40; ABC score 19.4%     Posture/Postural Control   Posture/Postural Control Postural limitations   Postural Limitations Posterior pelvic tilt;Rounded Shoulders     ROM / Strength   AROM / PROM / Strength Strength     Strength   Strength Assessment Site Hip;Knee;Ankle   Right/Left Hip Left;Right   Right Hip Flexion 3+/5   Left Hip Flexion 3/5   Right/Left Knee Right;Left   Right Knee Flexion 3+/5   Right Knee Extension 3-/5   Left Knee Flexion 3+/5   Left Knee Extension 3-/5   Right/Left Ankle Right;Left   Right Ankle Dorsiflexion 3-/5   Left Ankle Dorsiflexion 3+/5     Ambulation/Gait   Ambulation/Gait Yes   Ambulation/Gait Assistance 5: Supervision   Ambulation/Gait Assistance Details Requires periodic seated rest breaks due to fatigue. Patient requires supervision for safety due to LE weakness and knee buckling (especially R knee).    Ambulation Distance (Feet) 115 Feet  ambulated >115 in total, but requires seated rest break   Assistive device Straight cane   Gait Pattern Step-to pattern;Decreased step length - left;Decreased stance time - right;Decreased stride length;Decreased dorsiflexion - right;Decreased dorsiflexion -  left;Decreased weight shift to left;Right genu recurvatum;Trendelenburg;Wide base of support  audible L foot slap    Ambulation Surface Level;Indoor   Gait velocity 1.5ft/s with cane      Standardized Balance Assessment   Standardized Balance Assessment Timed Up and Go Test;Dynamic Gait Index     Dynamic Gait Index   Level Surface Moderate Impairment  11.88s for 91ft    Change in Gait Speed Moderate Impairment   Gait with Horizontal Head Turns Mild Impairment   Gait with Vertical Head Turns Mild Impairment   Gait and Pivot Turn Moderate Impairment  7 sec   Step Over Obstacle Moderate Impairment   Step Around Obstacles Mild Impairment   Steps Moderate Impairment   Total Score 11     Timed Up and Go Test   Normal TUG (  seconds) 23.63  with cane   Cognitive TUG (seconds) 21.69  with cane                            PT Education - 08/10/16 1231    Education provided Yes   Education Details plan of care including plan to create HEP in future, interpretation of outcome measure scores (gait speed, TUG, and DGI)    Person(s) Educated Patient;Spouse   Methods Explanation   Comprehension Verbalized understanding          PT Short Term Goals - 08/10/16 1251      PT SHORT TERM GOAL #1   Title Patient will verbalize understanding of HEP in order to increase LE strength and balance deficits. (TARGET DATE: 09/07/2016)   Time 4   Period Weeks   Status New     PT SHORT TERM GOAL #2   Title Patient will achieve a TUG score of <18 seconds with a cane in order to demonstrate a decrease in his risk of falling. (TARGET DATE: 09/07/2016)    Time 4   Period Weeks   Status New     PT SHORT TERM GOAL #3   Title Patient will improve DGI score to > or = 15/24 to indicate a decrease in his risk of falling. (TARGET DATE: 09/07/2016)   Time 4   Period Weeks   Status New     PT SHORT TERM GOAL #4   Title Patient will verbalize understanding of a fall prevention plan to  decrease his risk of falling. (TARGET DATE: 09/07/2016)   Time 4   Period Weeks   Status New           PT Long Term Goals - 08/10/16 1244      PT LONG TERM GOAL #1   Title Patient will verbalize understanding of modified activities and exercises to perform in the community (at [employer]). (TARGET DATE: 10/05/2016)   Time 8   Period Weeks   Status New     PT LONG TERM GOAL #2   Title Patient will have a gait speed of > or = 2.63ft/s indicating he is able to safely ambulate in the community. (TARGET DATE: 10/05/2016)   Time 8   Period Weeks   Status New     PT LONG TERM GOAL #3   Title Patient will achieve a TUG score of <14 seconds with LRAD to indicate a decrease in his risk of falling. (TARGET DATE: 10/05/2016)   Time 8   Period Weeks   Status New     PT LONG TERM GOAL #4   Title Patient will safely demonstrate a reciprocal, step-through gait pattern when ascending and descending stairs with LRAD and supervision. (TARGET DATE: 10/05/2016)   Time 8   Period Weeks   Status New     PT LONG TERM GOAL #5   Title Patient will achieve a score of >18/24 on the DGI in order to demonstrate a decrease in his risk of falling. (TARGET DATE: 10/05/2016)   Time 8   Period Weeks   Status New     Additional Long Term Goals   Additional Long Term Goals Yes     PT LONG TERM GOAL #6   Title Pt will improve ABC scale score to at least 40% to demonstrate improved confidence with mobility.  TARGET 10/05/16   Time 8   Period Weeks   Status New  Plan - 09-Sep-2016 1235    Clinical Impression Statement Patient is a 76 year old male presenting to PT s/p multiple falls following a popliteal stent placed on 06/12/2016. Patient's past medical history is significant for PAD, HTN, hypercholesterolemia, chest pain, dyspnea, glaucoma, GERD, B ankle injuries, R fibula fracture, and generalized weakness. Patient's gait speed is 1.76ft/s with a cane, which indicates a limited community  ambulator. Patient's TUG score was 23.63s with a cane and DGI score was 11/24, which both indicate he is at a high risk of falling. Patient also reports a high fear of falling, which also places him at risk to fall. Patient will benefit from skilled PT to address LE weakness, balance and mobility deficits, gait, and posture. Patient's prognosis is good should he receive skilled PT.   Rehab Potential Good   Clinical Impairments Affecting Rehab Potential PAD, HTN, hypercholesterolemia, dyspnea, chest pain, glaucoma, GERD, bilateral ankle injuries, previous R fibula fracture    PT Frequency 2x / week   PT Duration 8 weeks   PT Treatment/Interventions ADLs/Self Care Home Management;DME Instruction;Gait training;Stair training;Functional mobility training;Therapeutic activities;Therapeutic exercise;Balance training;Patient/family education;Neuromuscular re-education;Manual techniques   PT Next Visit Plan check hip strength, check for potential leg length discrepancy, mobility and strengthening exercises, begin to work on HEP for home    Consulted and Agree with Plan of Care Patient      Patient will benefit from skilled therapeutic intervention in order to improve the following deficits and impairments:  Abnormal gait, Cardiopulmonary status limiting activity, Decreased activity tolerance, Decreased balance, Decreased endurance, Decreased mobility, Decreased range of motion, Decreased safety awareness, Difficulty walking, Decreased strength, Postural dysfunction, Pain  Visit Diagnosis: Muscle weakness (generalized)  Other abnormalities of gait and mobility  Unsteadiness on feet      G-Codes - Sep 09, 2016 1339    Functional Assessment Tool Used (Outpatient Only) ABC score 19.4%, gait velocity 1.64 ft/sec, TUG 23.63 sec, TUG cog 21.69 sec, DGI 11/24, 3 falls since 05/2016   Functional Limitation Mobility: Walking and moving around   Mobility: Walking and Moving Around Current Status [phone]) At least 40  percent but less than 60 percent impaired, limited or restricted   Mobility: Walking and Moving Around Goal Status [phone]) At least 20 percent but less than 40 percent impaired, limited or restricted       Problem List Patient Active Problem List   Diagnosis Date Noted  . Claudication in peripheral vascular disease ([place]) 06/10/2016  . Morbid obesity ([place]) 09/21/2015  . Upper airway cough syndrome 09/20/2015  . Cough variant asthma 09/20/2015    Arelia Sneddon, SPT 09-09-16, 1:42 PM  [facility] [address] [place], [place], [address] Phone: [phone]   Fax:  [phone]  Name: BARTT GONZAGA MRN: [id number] Date of Birth: [birth year]  Evaluation performed by Mady Haagensen, PT, with documentation provided by Arelia Sneddon, SPT, with PT input/assistance.  Mady Haagensen, PT 09/09/2016 1:44 PM Phone: [phone] Fax: [phone]

## 2016-08-13 ENCOUNTER — Ambulatory Visit: Payer: Medicare Other | Admitting: Physical Therapy

## 2016-08-15 ENCOUNTER — Ambulatory Visit: Payer: Medicare Other | Admitting: Physical Therapy

## 2016-08-16 ENCOUNTER — Ambulatory Visit: Payer: Medicare Other | Admitting: Physical Therapy

## 2016-08-16 DIAGNOSIS — M6281 Muscle weakness (generalized): Secondary | ICD-10-CM | POA: Diagnosis not present

## 2016-08-16 NOTE — Patient Instructions (Addendum)
Quad Sets    Squeeze pelvic floor and hold. Tighten top of left thigh. Hold for _3__ seconds. Relax for _3__ seconds. Repeat _10__ times. Do _1-2_ times a day. Repeat with other leg.    Copyright  VHI. All rights reserved.  Lower Abdominals With Heel Slide    Start with both legs bent.  Alternate lifting your legs, as in marching. Do __10_ times, each leg.  _1-2__ times per day.  http://ss.exer.us/32   Copyright  VHI. All rights reserved.  Hip Abduction / Adduction: with Knee Flexion (Supine)    With both knees bent, and band around knees gently left lower knee to side and return. Repeat ___10_ times per set. Do __1-2__ sets per session.   http://orth.exer.us/682   Chttp://orth.exer.us/682   http://orth.exer.us/682   Copyright  VHI. All rights reserved.  Sit to Stand / Stand to Sit / Transfers    Sit on edge of a solid chair or your bed with arms, feet flat on floor. Lean forward over feet and stand up with hands on chair arms. Squeeze through your hips and thighs and make sure your R knee doesn't snap back.  Sit down slowly with hands on chair arms. Repeat _5-10___ times per session.   Copyright  VHI. All rights reserved.

## 2016-08-16 NOTE — Therapy (Signed)
[facility] [address] [place], [place], [address] Phone: [phone]   Fax:  [phone]  Physical Therapy Treatment  Patient Details  Name: Andrew Larsen MRN: [id number] Date of Birth: 12/06/1940 Referring Provider: Einar Gip  Encounter Date: 08/16/2016      PT End of Session - 08/16/16 1257    Visit Number 2   Number of Visits 17   Date for PT Re-Evaluation 10/05/16   Authorization Type [place] required every 10th visit   PT Start Time 1020  Pt arrived late   PT Stop Time 1108   PT Time Calculation (min) 48 min   Activity Tolerance Patient tolerated treatment well   Behavior During Therapy [facility] for tasks assessed/performed      Past Medical History:  Diagnosis Date  . Daytime somnolence   . Diabetes mellitus without complication ([place])   . Fracture    right fibula/wears boot cast  . GERD (gastroesophageal reflux disease)   . Glaucoma   . Hypercholesteremia   . Hyperlipidemia   . Hypertension   . Mold exposure     Past Surgical History:  Procedure Laterality Date  . LOWER EXTREMITY ANGIOGRAPHY N/A 06/12/2016   Procedure: Lower Extremity Angiography;  Surgeon: Adrian Prows, MD;  Location: [facility];  Service: Cardiovascular;  Laterality: N/A;  . ORIF ANKLE FRACTURE Right 10/02/2012   Procedure: OPEN REDUCTION INTERNAL FIXATION (ORIF) ANKLE FRACTURE;  Surgeon: Meredith Pel, MD;  [facility];  Service: Orthopedics;  Laterality: Right;  . PERIPHERAL VASCULAR ATHERECTOMY Left 06/12/2016   Procedure: Peripheral Vascular Atherectomy-Left Popliteal;  Surgeon: Adrian Prows, MD;  Location: [facility];  Service: Cardiovascular;  Laterality: Left;  . PERIPHERAL VASCULAR INTERVENTION Left 06/12/2016   Procedure: Peripheral Vascular Intervention- DCB Left Popliteal;  Surgeon: Adrian Prows, MD;  Location: [facility];  Service: Cardiovascular;  Laterality: Left;    There  were no vitals filed for this visit.      Subjective Assessment - 08/16/16 1023    Subjective No falls since last visit.     Patient is accompained by: Family member   Pertinent History PAD, HTN, hypercholesterolemia, chest pain, dyspnea, glaucoma, GERD, bilateral ankle injuries due to falling, R fibula fracture    Limitations Standing;Walking;House hold activities   Patient Stated Goals to stop falling    Currently in Pain? Yes   Pain Score 4    Pain Location Back  L hip into L thigh   Pain Orientation Left   Pain Descriptors / Indicators Radiating  electric charge   Pain Type Chronic pain   Pain Onset More than a month ago   Pain Frequency Intermittent   Aggravating Factors  sitting aggravates   Pain Relieving Factors taking weight off alleviates pain; pain medication alleviates                         [facility] Adult PT Treatment/Exercise - 08/16/16 1026      Exercises   Exercises Knee/Hip  Assessed MMT:  R hip abd 4/5, L hip abduct 3/5     Knee/Hip Exercises: Standing   Other Standing Knee Exercises Sit<>stand x 5 reps from elevated mat height to simulate bed height, cues for quad/glut activation upon standing; cues to avoid R knee recurvatum     Knee/Hip Exercises: Seated   Marching Strengthening;5 reps  Difficulty with lifting LLE off mat     Knee/Hip Exercises:  Supine   Quad Sets Strengthening;Right;Left;10 reps   Short Arc Quad Sets AROM;Strengthening;10 reps  LLE, 5 reps not full contraction with RLE   Straight Leg Raises Strengthening;Left;5 sets  Quad lag on RLE   Straight Leg Raises Limitations RLE P/ROM into SLR and pt performs eccentric quad contraction, bringing leg back onto mat x 10 reps   Other Supine Knee/Hip Exercises Supine hooklying hip abduction LLE x 10 reps, then bilateral lower extremities resisted with red theraband      Self Care:  Attempted to assess leg length difference-unable to take accurate measurement due to excess tissue  limiting finding bony prominences (ASIS) -Rough assessment after pt performs bridging x 3 reps, then PT lowers into bilateral extended lower extremity position.  Initially, LLE appears longer; however, upon inspection at medial malleoli, no significant height difference noted RLE vs. LLE.   -Discussed recurvatum of R knee and trying to avoid going into position of recurvatum with sit<>stand activities.  Discussed possibilities of how to address next visit-?heel wedge RLE or possible knee brace?          PT Education - 08/16/16 1257    Education provided Yes   Education Details HEP-to address hip and lower extremity strengthening-see instructions   Person(s) Educated Patient;Spouse   Methods Explanation;Demonstration;Handout   Comprehension Verbalized understanding;Returned demonstration          PT Short Term Goals - 08/10/16 1251      PT SHORT TERM GOAL #1   Title Patient will verbalize understanding of HEP in order to increase LE strength and balance deficits. (TARGET DATE: 09/07/2016)   Time 4   Period Weeks   Status New     PT SHORT TERM GOAL #2   Title Patient will achieve a TUG score of <18 seconds with a cane in order to demonstrate a decrease in his risk of falling. (TARGET DATE: 09/07/2016)    Time 4   Period Weeks   Status New     PT SHORT TERM GOAL #3   Title Patient will improve DGI score to > or = 15/24 to indicate a decrease in his risk of falling. (TARGET DATE: 09/07/2016)   Time 4   Period Weeks   Status New     PT SHORT TERM GOAL #4   Title Patient will verbalize understanding of a fall prevention plan to decrease his risk of falling. (TARGET DATE: 09/07/2016)   Time 4   Period Weeks   Status New           PT Long Term Goals - 08/10/16 1244      PT LONG TERM GOAL #1   Title Patient will verbalize understanding of modified activities and exercises to perform in the community (at [employer]). (TARGET DATE: 10/05/2016)   Time 8   Period Weeks   Status  New     PT LONG TERM GOAL #2   Title Patient will have a gait speed of > or = 2.20ft/s indicating he is able to safely ambulate in the community. (TARGET DATE: 10/05/2016)   Time 8   Period Weeks   Status New     PT LONG TERM GOAL #3   Title Patient will achieve a TUG score of <14 seconds with LRAD to indicate a decrease in his risk of falling. (TARGET DATE: 10/05/2016)   Time 8   Period Weeks   Status New     PT LONG TERM GOAL #4   Title Patient will safely demonstrate  a reciprocal, step-through gait pattern when ascending and descending stairs with LRAD and supervision. (TARGET DATE: 10/05/2016)   Time 8   Period Weeks   Status New     PT LONG TERM GOAL #5   Title Patient will achieve a score of >18/24 on the DGI in order to demonstrate a decrease in his risk of falling. (TARGET DATE: 10/05/2016)   Time 8   Period Weeks   Status New     Additional Long Term Goals   Additional Long Term Goals Yes     PT LONG TERM GOAL #6   Title Pt will improve ABC scale score to at least 40% to demonstrate improved confidence with mobility.  TARGET 10/05/16   Time 8   Period Weeks   Status New               Plan - 08/16/16 1258    Clinical Impression Statement Assessed MMT of hip abductors (decreased hip abductor strength on LLE may contribute to Trendelenburg typed gait).  Focused today's session on initiating HEP for lower extremity strengthening.  Pt demo difficulty initiating SLR and SAQ on RLE due to decreased quad strength, so quad sets initiated to address quad strength.  Began discussion of how to decreased recurvatum of R knee during standing and gait activities.   Rehab Potential Good   Clinical Impairments Affecting Rehab Potential PAD, HTN, hypercholesterolemia, dyspnea, chest pain, glaucoma, GERD, bilateral ankle injuries, previous R fibula fracture    PT Frequency 2x / week   PT Duration 8 weeks   PT Treatment/Interventions ADLs/Self Care Home Management;DME Instruction;Gait  training;Stair training;Functional mobility training;Therapeutic activities;Therapeutic exercise;Balance training;Patient/family education;Neuromuscular re-education;Manual techniques   PT Next Visit Plan Review HEP; work on standing activities to address R knee recurvatum; balance activities and gait   PT Home Exercise Plan 08/16/16 lower extremity strengthening   Consulted and Agree with Plan of Care Patient;Family member/caregiver   Family Member Consulted Wife      Patient will benefit from skilled therapeutic intervention in order to improve the following deficits and impairments:  Abnormal gait, Cardiopulmonary status limiting activity, Decreased activity tolerance, Decreased balance, Decreased endurance, Decreased mobility, Decreased range of motion, Decreased safety awareness, Difficulty walking, Decreased strength, Postural dysfunction, Pain  Visit Diagnosis: Muscle weakness (generalized)     Problem List Patient Active Problem List   Diagnosis Date Noted  . Claudication in peripheral vascular disease ([place]) 06/10/2016  . Morbid obesity ([place]) 09/21/2015  . Upper airway cough syndrome 09/20/2015  . Cough variant asthma 09/20/2015    Kal[place] Pflum W. 08/16/2016, 1:03 PM  [facility] PT [facility] [address] [place], [place], [address] Phone: [phone]   Fax:  [phone]  Name: Andrew Larsen MRN: [id number] Date of Birth: 1940-10-09

## 2016-08-17 ENCOUNTER — Ambulatory Visit: Payer: Medicare Other | Admitting: Physical Therapy

## 2016-08-17 DIAGNOSIS — M6281 Muscle weakness (generalized): Secondary | ICD-10-CM

## 2016-08-18 NOTE — Therapy (Signed)
[facility] [address] [place], [place], [address] Phone: [phone]   Fax:  [phone]  Physical Therapy Treatment  Patient Details  Name: Andrew Larsen MRN: [id number] Date of Birth: [birth year] Referring Provider: Einar Gip  Encounter Date: 08/17/2016      PT End of Session - 08/18/16 2033    Visit Number 3   Number of Visits 17   Date for PT Re-Evaluation 10/05/16   Authorization Type [place] required every 10th visit   PT Start Time 1153   PT Stop Time 1233   PT Time Calculation (min) 40 min   Equipment Utilized During Treatment Gait belt   Activity Tolerance Patient tolerated treatment well   Behavior During Therapy [facility] for tasks assessed/performed      Past Medical History:  Diagnosis Date  . Daytime somnolence   . Diabetes mellitus without complication ([place])   . Fracture    right fibula/wears boot cast  . GERD (gastroesophageal reflux disease)   . Glaucoma   . Hypercholesteremia   . Hyperlipidemia   . Hypertension   . Mold exposure     Past Surgical History:  Procedure Laterality Date  . LOWER EXTREMITY ANGIOGRAPHY N/A 06/12/2016   Procedure: Lower Extremity Angiography;  Surgeon: Adrian Prows, MD;  Location: [facility];  Service: Cardiovascular;  Laterality: N/A;  . ORIF ANKLE FRACTURE Right 10/02/2012   Procedure: OPEN REDUCTION INTERNAL FIXATION (ORIF) ANKLE FRACTURE;  Surgeon: Meredith Pel, MD;  Location: [facility];  Service: Orthopedics;  Laterality: Right;  . PERIPHERAL VASCULAR ATHERECTOMY Left 06/12/2016   Procedure: Peripheral Vascular Atherectomy-Left Popliteal;  Surgeon: Adrian Prows, MD;  Location: [facility];  Service: Cardiovascular;  Laterality: Left;  . PERIPHERAL VASCULAR INTERVENTION Left 06/12/2016   Procedure: Peripheral Vascular Intervention- DCB Left Popliteal;  Surgeon: Adrian Prows, MD;  Location: [facility];  Service: Cardiovascular;   Laterality: Left;    There were no vitals filed for this visit.      Subjective Assessment - 08/18/16 2026    Subjective Left the cane at home today.   Patient is accompained by: Family member   Pertinent History PAD, HTN, hypercholesterolemia, chest pain, dyspnea, glaucoma, GERD, bilateral ankle injuries due to falling, R fibula fracture    Limitations Standing;Walking;House hold activities   Patient Stated Goals to stop falling    Currently in Pain? No/denies   Pain Onset More than a month ago                         [facility] Adult PT Treatment/Exercise - 08/18/16 0001      Exercises   Exercises Knee/Hip     Knee/Hip Exercises: Standing   Knee Flexion Strengthening;Right;Left;1 set;10 reps   Terminal Knee Extension Strengthening;Right;1 set;10 reps  each, 3 positions with green theraband   Other Standing Knee Exercises Sit<>stand x 2 sets of 5 reps from elevated mat height to simulate bed height, cues for quad/glut activation upon standing; cues to avoid R knee recurvatum   Other Standing Knee Exercises Consecutive step taps 2 sets x 10, with bilateral>single UE support, taps at 6" step.  PT provides tactile/manual cues at posterior knee to prevent R knee recurvatum.     Knee/Hip Exercises: Seated   Hamstring Curl Strengthening;Right;1 set;10 reps  Green theraband     Reviewed HEP given last visit:  Quad sets x 5 reps, hooklying hip/knee flexion x 5 reps,  hooklying resisted hip abduction x 5 reps and sit<>stand x 5 reps.  Pt return demo understanding, but reports not doing them since just being in therapy yesterday.        Balance Exercises - 08/18/16 2032      Balance Exercises: Standing   Tandem Stance Eyes open;Upper extremity support 1;3 reps;10 secs  progression semi-tandem>tandem stance   SLS Eyes open;Solid surface;Upper extremity support 2;10 secs;3 reps   Sidestepping Upper extremity support;3 reps  to R and L x 12 ft along counter              PT Short Term Goals - 08/10/16 1251      PT SHORT TERM GOAL #1   Title Patient will verbalize understanding of HEP in order to increase LE strength and balance deficits. (TARGET DATE: 09/07/2016)   Time 4   Period Weeks   Status New     PT SHORT TERM GOAL #2   Title Patient will achieve a TUG score of <18 seconds with a cane in order to demonstrate a decrease in his risk of falling. (TARGET DATE: 09/07/2016)    Time 4   Period Weeks   Status New     PT SHORT TERM GOAL #3   Title Patient will improve DGI score to > or = 15/24 to indicate a decrease in his risk of falling. (TARGET DATE: 09/07/2016)   Time 4   Period Weeks   Status New     PT SHORT TERM GOAL #4   Title Patient will verbalize understanding of a fall prevention plan to decrease his risk of falling. (TARGET DATE: 09/07/2016)   Time 4   Period Weeks   Status New           PT Long Term Goals - 08/10/16 1244      PT LONG TERM GOAL #1   Title Patient will verbalize understanding of modified activities and exercises to perform in the community (at [employer]). (TARGET DATE: 10/05/2016)   Time 8   Period Weeks   Status New     PT LONG TERM GOAL #2   Title Patient will have a gait speed of > or = 2.8ft/s indicating he is able to safely ambulate in the community. (TARGET DATE: 10/05/2016)   Time 8   Period Weeks   Status New     PT LONG TERM GOAL #3   Title Patient will achieve a TUG score of <14 seconds with LRAD to indicate a decrease in his risk of falling. (TARGET DATE: 10/05/2016)   Time 8   Period Weeks   Status New     PT LONG TERM GOAL #4   Title Patient will safely demonstrate a reciprocal, step-through gait pattern when ascending and descending stairs with LRAD and supervision. (TARGET DATE: 10/05/2016)   Time 8   Period Weeks   Status New     PT LONG TERM GOAL #5   Title Patient will achieve a score of >18/24 on the DGI in order to demonstrate a decrease in his risk of falling. (TARGET  DATE: 10/05/2016)   Time 8   Period Weeks   Status New     Additional Long Term Goals   Additional Long Term Goals Yes     PT LONG TERM GOAL #6   Title Pt will improve ABC scale score to at least 40% to demonstrate improved confidence with mobility.  TARGET 10/05/16   Time 8   Period Weeks  Status New               Plan - 08/18/16 2034    Clinical Impression Statement Further work today on quad and hamstring stregnthening and standing and seated positions.  Pt appears to have increased awareness of R knee positioning, as he notes he feels less "knee popping back" with short distance gait between activities in gym area.  Discussed need for cane for safety due to fall risk with gait.   Rehab Potential Good   Clinical Impairments Affecting Rehab Potential PAD, HTN, hypercholesterolemia, dyspnea, chest pain, glaucoma, GERD, bilateral ankle injuries, previous R fibula fracture (significant PMH)   PT Frequency 2x / week   PT Duration 8 weeks   PT Treatment/Interventions ADLs/Self Care Home Management;DME Instruction;Gait training;Stair training;Functional mobility training;Therapeutic activities;Therapeutic exercise;Balance training;Patient/family education;Neuromuscular re-education;Manual techniques   PT Next Visit Plan Work on standing activities to address R knee strengthening-including terminal knee extension, hamstring strengthening, step ups and step downs from low step; balance and gait training   PT Home Exercise Plan 08/16/16 lower extremity strengthening   Consulted and Agree with Plan of Care Patient;Family member/caregiver   Family Member Consulted Wife      Patient will benefit from skilled therapeutic intervention in order to improve the following deficits and impairments:  Abnormal gait, Cardiopulmonary status limiting activity, Decreased activity tolerance, Decreased balance, Decreased endurance, Decreased mobility, Decreased range of motion, Decreased safety awareness,  Difficulty walking, Decreased strength, Postural dysfunction, Pain  Visit Diagnosis: Muscle weakness (generalized)     Problem List Patient Active Problem List   Diagnosis Date Noted  . Claudication in peripheral vascular disease ([place]) 06/10/2016  . Morbid obesity ([place]) 09/21/2015  . Upper airway cough syndrome 09/20/2015  . Cough variant asthma 09/20/2015    Camaron Cammack W. 08/18/2016, 8:37 PM  Frazier Butt., PT  [facility] [address] [place], [place], [address] Phone: [phone]   Fax:  [phone]  Name: Andrew Larsen MRN: [id number] Date of Birth: [birth year]

## 2016-08-20 ENCOUNTER — Encounter: Payer: Self-pay | Admitting: Physical Therapy

## 2016-08-20 ENCOUNTER — Ambulatory Visit: Payer: Medicare Other | Admitting: Physical Therapy

## 2016-08-20 DIAGNOSIS — M6281 Muscle weakness (generalized): Secondary | ICD-10-CM | POA: Diagnosis not present

## 2016-08-20 DIAGNOSIS — R2689 Other abnormalities of gait and mobility: Secondary | ICD-10-CM

## 2016-08-20 NOTE — Therapy (Signed)
[facility] [address] [place], [place], [address] Phone: [phone]   Fax:  [phone]  Physical Therapy Treatment  Patient Details  Name: Andrew Larsen MRN: [id number] Date of Birth: 1940-07-23 Referring Provider: Einar Gip  Encounter Date: 08/20/2016      PT End of Session - 08/20/16 1740    Visit Number 4   Number of Visits 17   Date for PT Re-Evaluation 10/05/16   Authorization Type [place] required every 10th visit   PT Start Time 1155   PT Stop Time 1242   PT Time Calculation (min) 47 min   Activity Tolerance Patient tolerated treatment well   Behavior During Therapy [facility] for tasks assessed/performed      Past Medical History:  Diagnosis Date  . Daytime somnolence   . Diabetes mellitus without complication ([place])   . Fracture    right fibula/wears boot cast  . GERD (gastroesophageal reflux disease)   . Glaucoma   . Hypercholesteremia   . Hyperlipidemia   . Hypertension   . Mold exposure     Past Surgical History:  Procedure Laterality Date  . LOWER EXTREMITY ANGIOGRAPHY N/A 06/12/2016   Procedure: Lower Extremity Angiography;  Surgeon: Adrian Prows, MD;  Location: [facility];  Service: Cardiovascular;  Laterality: N/A;  . ORIF ANKLE FRACTURE Right 10/02/2012   Procedure: OPEN REDUCTION INTERNAL FIXATION (ORIF) ANKLE FRACTURE;  Surgeon: Meredith Pel, MD;  Location: [facility];  Service: Orthopedics;  Laterality: Right;  . PERIPHERAL VASCULAR ATHERECTOMY Left 06/12/2016   Procedure: Peripheral Vascular Atherectomy-Left Popliteal;  Surgeon: Adrian Prows, MD;  Location: [facility];  Service: Cardiovascular;  Laterality: Left;  . PERIPHERAL VASCULAR INTERVENTION Left 06/12/2016   Procedure: Peripheral Vascular Intervention- DCB Left Popliteal;  Surgeon: Adrian Prows, MD;  Location: [facility];  Service: Cardiovascular;  Laterality: Left;    There were no vitals  filed for this visit.      Subjective Assessment - 08/20/16 1200    Subjective States he does not use cane inside home but does take it when he goes out. Denies recent falls, however reports fear at some tasks during PT due to h/o knee buckling.   Patient is accompained by: Family member   Pertinent History PAD, HTN, hypercholesterolemia, chest pain, dyspnea, glaucoma, GERD, bilateral ankle injuries due to falling, R fibula fracture    Limitations Standing;Walking;House hold activities   Patient Stated Goals to stop falling    Currently in Pain? Yes   Pain Score 5    Pain Location Back   Pain Orientation Lower   Pain Descriptors / Indicators Radiating   Pain Type Chronic pain   Pain Onset More than a month ago   Pain Frequency Constant                         [facility] Adult PT Treatment/Exercise - 08/20/16 0001      Transfers   Transfers Sit to Stand;Stand to Sit   Sit to Stand 5: Supervision   Sit to Stand Details (indicate cue type and reason) vc for technique and to prevent Rt genu recurvatum   Stand to Sit 5: Supervision;Without upper extremity assist;To bed;Uncontrolled descent   Number of Reps 10 reps     Ambulation/Gait   Ambulation/Gait Assistance 5: Supervision   Ambulation/Gait Assistance Details vc to try to limit Rt knee hyperextension; noted pt carries cane on his right (weaker  side) and feels more secure   Ambulation Distance (Feet) 120 Feet  70   Assistive device Straight cane   Gait Pattern Decreased step length - left;Decreased stance time - right;Decreased dorsiflexion - right;Decreased dorsiflexion - left;Right genu recurvatum;Trendelenburg;Wide base of support;Step-through pattern   Ambulation Surface Level;Indoor     Knee/Hip Exercises: Standing   Lateral Step Up Right;Left;1 set;10 reps;Hand Hold: 2;Step Height: 2"   Lateral Step Up Limitations attempted 4" step with pt locking Rt knee in hyperextension due to fear of buckling; with 2" step  able to control knee ~6 out of 10 times descending onto RLE   Rocker Board 4 minutes   Rocker Board Limitations balance with knees slightly flexed up to 60 seconds intermittent UE support; controlled PF and DF to move rockerboard with knees flexed     Knee/Hip Exercises: Seated   Other Seated Knee/Hip Exercises seated knee press with black band (bil UE holding ends of band     Knee/Hip Exercises: Supine   Bridges Limitations normal bridge with 5 sec hold x 10   Single Leg Bridge Right;1 set;10 reps   Other Supine Knee/Hip Exercises pelvic tilts with knees over peanut physioball                 PT Education - 08/20/16 1739    Education provided Yes   Education Details additions to HEP; rationale for avoiding knee hyperextension during exercises and for gait   Person(s) Educated Patient;Spouse   Methods Explanation;Demonstration;Verbal cues;Handout   Comprehension Verbalized understanding;Need further instruction          PT Short Term Goals - 08/10/16 1251      PT SHORT TERM GOAL #1   Title Patient will verbalize understanding of HEP in order to increase LE strength and balance deficits. (TARGET DATE: 09/07/2016)   Time 4   Period Weeks   Status New     PT SHORT TERM GOAL #2   Title Patient will achieve a TUG score of <18 seconds with a cane in order to demonstrate a decrease in his risk of falling. (TARGET DATE: 09/07/2016)    Time 4   Period Weeks   Status New     PT SHORT TERM GOAL #3   Title Patient will improve DGI score to > or = 15/24 to indicate a decrease in his risk of falling. (TARGET DATE: 09/07/2016)   Time 4   Period Weeks   Status New     PT SHORT TERM GOAL #4   Title Patient will verbalize understanding of a fall prevention plan to decrease his risk of falling. (TARGET DATE: 09/07/2016)   Time 4   Period Weeks   Status New           PT Long Term Goals - 08/10/16 1244      PT LONG TERM GOAL #1   Title Patient will verbalize understanding of  modified activities and exercises to perform in the community (at [employer]). (TARGET DATE: 10/05/2016)   Time 8   Period Weeks   Status New     PT LONG TERM GOAL #2   Title Patient will have a gait speed of > or = 2.64ft/s indicating he is able to safely ambulate in the community. (TARGET DATE: 10/05/2016)   Time 8   Period Weeks   Status New     PT LONG TERM GOAL #3   Title Patient will achieve a TUG score of <14 seconds with LRAD to indicate a  decrease in his risk of falling. (TARGET DATE: 10/05/2016)   Time 8   Period Weeks   Status New     PT LONG TERM GOAL #4   Title Patient will safely demonstrate a reciprocal, step-through gait pattern when ascending and descending stairs with LRAD and supervision. (TARGET DATE: 10/05/2016)   Time 8   Period Weeks   Status New     PT LONG TERM GOAL #5   Title Patient will achieve a score of >18/24 on the DGI in order to demonstrate a decrease in his risk of falling. (TARGET DATE: 10/05/2016)   Time 8   Period Weeks   Status New     Additional Long Term Goals   Additional Long Term Goals Yes     PT LONG TERM GOAL #6   Title Pt will improve ABC scale score to at least 40% to demonstrate improved confidence with mobility.  TARGET 10/05/16   Time 8   Period Weeks   Status New               Plan - 08/20/16 1741    Clinical Impression Statement Session focused on RLE and core strengthening along with gait training. Patient denied knee pain, however educated on potential risks of continued knee hyperextension. Patient is progressing towards goals.    Rehab Potential Good   Clinical Impairments Affecting Rehab Potential PAD, HTN, hypercholesterolemia, dyspnea, chest pain, glaucoma, GERD, bilateral ankle injuries, previous R fibula fracture (significant PMH)   PT Frequency 2x / week   PT Duration 8 weeks   PT Treatment/Interventions ADLs/Self Care Home Management;DME Instruction;Gait training;Stair training;Functional mobility  training;Therapeutic activities;Therapeutic exercise;Balance training;Patient/family education;Neuromuscular re-education;Manual techniques   PT Next Visit Plan Work on standing activities to address R knee strengthening-including terminal knee extension, hamstring strengthening, step ups and step downs from low step; balance and gait training   PT Home Exercise Plan 08/16/16 lower extremity strengthening   Consulted and Agree with Plan of Care Patient;Family member/caregiver   Family Member Consulted Wife      Patient will benefit from skilled therapeutic intervention in order to improve the following deficits and impairments:  Abnormal gait, Cardiopulmonary status limiting activity, Decreased activity tolerance, Decreased balance, Decreased endurance, Decreased mobility, Decreased range of motion, Decreased safety awareness, Difficulty walking, Decreased strength, Postural dysfunction, Pain  Visit Diagnosis: Muscle weakness (generalized)  Other abnormalities of gait and mobility     Problem List Patient Active Problem List   Diagnosis Date Noted  . Claudication in peripheral vascular disease ([place]) 06/10/2016  . Morbid obesity ([place]) 09/21/2015  . Upper airway cough syndrome 09/20/2015  . Cough variant asthma 09/20/2015    Rexanne Mano, PT 08/20/2016, 7:11 PM  [facility] [address] [place], [place], [address] Phone: [phone]   Fax:  [phone]  Name: Andrew Larsen MRN: [id number] Date of Birth: 12/01/1940

## 2016-08-20 NOTE — Patient Instructions (Addendum)
Pelvic Tilt (Flexion)    With feet flat and knees bent, flatten lower back into bed. Tighten stomach muscles. Hold __5__ seconds. Repeat __10__ times. Do _1-2___ sessions per day.  [email or web address].[email or web address]   Leg Extension / Flexion With Pelvis Neutral: Both Feet    Put band around bottom of right foot, holding loose ends of band in each hand. Allow band to pull leg up. Then press right leg out until knee is ALMOST straight. Hold for 3 seconds. Do _10__ times, __2_ times per day.  http://ss.exer.[email or web address]   Copyright  VHI. All rights reserved.    Copyright  VHI. All rights reserved.

## 2016-08-23 ENCOUNTER — Ambulatory Visit: Payer: Medicare Other | Admitting: Physical Therapy

## 2016-08-23 DIAGNOSIS — M6281 Muscle weakness (generalized): Secondary | ICD-10-CM | POA: Diagnosis not present

## 2016-08-23 DIAGNOSIS — R2681 Unsteadiness on feet: Secondary | ICD-10-CM

## 2016-08-23 DIAGNOSIS — R2689 Other abnormalities of gait and mobility: Secondary | ICD-10-CM

## 2016-08-23 NOTE — Therapy (Signed)
[facility] [address] [place], [place], [address] Phone: [phone]   Fax:  [phone]  Physical Therapy Treatment  Patient Details  Name: Andrew Larsen MRN: [id number] Date of Birth: May 22, 1940 Referring Provider: Einar Gip  Encounter Date: 08/23/2016      PT End of Session - 08/23/16 2009    Visit Number 5   Number of Visits 17   Date for PT Re-Evaluation 10/05/16   Authorization Type [place] required every 10th visit   PT Start Time 0933   PT Stop Time 1018   PT Time Calculation (min) 45 min   Equipment Utilized During Treatment Gait belt   Activity Tolerance Patient tolerated treatment well   Behavior During Therapy [facility] for tasks assessed/performed      Past Medical History:  Diagnosis Date  . Daytime somnolence   . Diabetes mellitus without complication ([place])   . Fracture    right fibula/wears boot cast  . GERD (gastroesophageal reflux disease)   . Glaucoma   . Hypercholesteremia   . Hyperlipidemia   . Hypertension   . Mold exposure     Past Surgical History:  Procedure Laterality Date  . LOWER EXTREMITY ANGIOGRAPHY N/A 06/12/2016   Procedure: Lower Extremity Angiography;  Surgeon: Adrian Prows, MD;  Location: [facility];  Service: Cardiovascular;  Laterality: N/A;  . ORIF ANKLE FRACTURE Right 10/02/2012   Procedure: OPEN REDUCTION INTERNAL FIXATION (ORIF) ANKLE FRACTURE;  Surgeon: Meredith Pel, MD;  Location: [facility];  Service: Orthopedics;  Laterality: Right;  . PERIPHERAL VASCULAR ATHERECTOMY Left 06/12/2016   Procedure: Peripheral Vascular Atherectomy-Left Popliteal;  Surgeon: Adrian Prows, MD;  Location: [facility];  Service: Cardiovascular;  Laterality: Left;  . PERIPHERAL VASCULAR INTERVENTION Left 06/12/2016   Procedure: Peripheral Vascular Intervention- DCB Left Popliteal;  Surgeon: Adrian Prows, MD;  Location: [facility];  Service: Cardiovascular;   Laterality: Left;    There were no vitals filed for this visit.      Subjective Assessment - 08/23/16 0935    Subjective Denies any falls, but pt has episode of LOB (catching foot on cane) upon walking into gym for therapy.  Exercises going well.   Patient is accompained by: Family member   Pertinent History PAD, HTN, hypercholesterolemia, chest pain, dyspnea, glaucoma, GERD, bilateral ankle injuries due to falling, R fibula fracture    Limitations Standing;Walking;House hold activities   Patient Stated Goals to stop falling    Currently in Pain? Yes   Pain Score 5    Pain Location Back   Pain Orientation Lower   Pain Descriptors / Indicators Radiating   Pain Type Chronic pain   Pain Onset More than a month ago   Aggravating Factors  sitting aggravates   Pain Relieving Factors Tylenol alleviates pain                         [facility] Adult PT Treatment/Exercise - 08/23/16 0001      Transfers   Transfers Sit to Stand;Stand to Sit   Sit to Stand 5: Supervision   Stand to Sit 5: Supervision;Without upper extremity assist;To bed;Uncontrolled descent   Stand to Sit Details Cues to square up to seat and slowly lower down to sit versus uncontrolled sit   Number of Reps --  Multiple reps through session     Ambulation/Gait   Ambulation/Gait Yes   Ambulation/Gait Assistance 5: Supervision   Ambulation/Gait  Assistance Details Trial using cane in L hand, cues for sequencing    Ambulation Distance (Feet) 120 Feet  100 x 2   Assistive device Straight cane   Gait Pattern Decreased step length - left;Decreased stance time - right;Decreased dorsiflexion - right;Decreased dorsiflexion - left;Right genu recurvatum;Trendelenburg;Wide base of support;Step-through pattern   Ambulation Surface Level;Indoor   Gait Comments Pt has one episode of foot catching upon coming into gym area.  He reaches out for wall for support to regain balance.  Discussed/practiced cane placement and  sequencing using LUE with weaker RLE.     Knee/Hip Exercises: Standing   Terminal Knee Extension Strengthening;Right;1 set;10 reps  Green theraband, 3 positions   Lateral Step Up Right;Left;1 set;5 reps;Hand Hold: 2;Step Height: 2"   Forward Step Up Right;Left;1 set;10 reps;Hand Hold: 2;Step Height: 2"   Forward Step Up Limitations Needs tactile/manual cues to prevent R knee recurvatum.  Pt has significant forward lean on parallel bars   Step Down Right;Hand Hold: 2  Attempted 2 reps   Step Down Limitations Pt has significant uncontrolled R knee flexion during step down activity.     Knee/Hip Exercises: Seated   Long Arc Quad Strengthening;Right;Left;1 set;10 reps  3 second hold     Knee/Hip Exercises: Supine   Bridges Limitations normal bridge with 5 sec hold x 10   Other Supine Knee/Hip Exercises Supine pelvic tilts with abdominal activation x 10-review of HEP-pt return demo understanding     Pt did not bring in black theraband today to therapy.  Performed LAQ without band, and pt verbalizes understanding of resisted LAQ added to HEP last visit.        Balance Exercises - 08/23/16 2001      Balance Exercises: Standing   Wall Bumps Hip   Wall Bumps-Hips Eyes opened;Anterior/posterior;10 reps  in parallel bars   Stepping Strategy Anterior;UE support;10 reps   Rockerboard Anterior/posterior;EO;5 reps  hip and ankle strategy, 5 reps each   Tandem Gait Forward;Retro;Upper extremity support;2 reps  parallel bars, progress to tandem march   Sidestepping Upper extremity support;3 reps  progressed to use of green band around thighs   Heel Raises Limitations x 10 reps    Toe Raise Limitations x 10 reps (difficulty raising toes from floor)             PT Short Term Goals - 08/23/16 2012      PT SHORT TERM GOAL #1   Title Patient will verbalize understanding of HEP in order to increase LE strength and balance deficits. (TARGET DATE: 09/07/2016)   Time 4   Period Weeks    Status On-going     PT SHORT TERM GOAL #2   Title Patient will achieve a TUG score of <18 seconds with a cane in order to demonstrate a decrease in his risk of falling. (TARGET DATE: 09/07/2016)    Time 4   Period Weeks   Status On-going     PT SHORT TERM GOAL #3   Title Patient will improve DGI score to > or = 15/24 to indicate a decrease in his risk of falling. (TARGET DATE: 09/07/2016)   Time 4   Period Weeks   Status On-going     PT SHORT TERM GOAL #4   Title Patient will verbalize understanding of a fall prevention plan to decrease his risk of falling. (TARGET DATE: 09/07/2016)   Time 4   Period Weeks   Status On-going  PT Long Term Goals - 08/23/16 2012      PT LONG TERM GOAL #1   Title Patient will verbalize understanding of modified activities and exercises to perform in the community (at [employer]). (TARGET DATE: 10/05/2016)   Time 8   Period Weeks   Status On-going     PT LONG TERM GOAL #2   Title Patient will have a gait speed of > or = 2.44ft/s indicating he is able to safely ambulate in the community. (TARGET DATE: 10/05/2016)   Time 8   Period Weeks   Status On-going     PT LONG TERM GOAL #3   Title Patient will achieve a TUG score of <14 seconds with LRAD to indicate a decrease in his risk of falling. (TARGET DATE: 10/05/2016)   Time 8   Period Weeks   Status On-going     PT LONG TERM GOAL #4   Title Patient will safely demonstrate a reciprocal, step-through gait pattern when ascending and descending stairs with LRAD and supervision. (TARGET DATE: 10/05/2016)   Time 8   Period Weeks   Status On-going     PT LONG TERM GOAL #5   Title Patient will achieve a score of >18/24 on the DGI in order to demonstrate a decrease in his risk of falling. (TARGET DATE: 10/05/2016)   Time 8   Period Weeks   Status On-going     PT LONG TERM GOAL #6   Title Pt will improve ABC scale score to at least 40% to demonstrate improved confidence with mobility.  TARGET  10/05/16   Time 8   Period Weeks   Status On-going               Plan - 08/23/16 2009    Clinical Impression Statement Session focused on continued functional lower extremity strengthening and balance, gait training.  Pt continues to have significantly decreased eccentric control of R quads with step-down and squat activities and continued recurvatum with gait and standing activities.  Discussed and may look into possibility of bracing to R knee.   Rehab Potential Good   Clinical Impairments Affecting Rehab Potential PAD, HTN, hypercholesterolemia, dyspnea, chest pain, glaucoma, GERD, bilateral ankle injuries, previous R fibula fracture (significant PMH)   PT Frequency 2x / week   PT Duration 8 weeks   PT Treatment/Interventions ADLs/Self Care Home Management;DME Instruction;Gait training;Stair training;Functional mobility training;Therapeutic activities;Therapeutic exercise;Balance training;Patient/family education;Neuromuscular re-education;Manual techniques   PT Next Visit Plan Continue to work on standing activities to address R knee strengthening-including terminal knee extension, hamstring strengthening, step ups and step downs from low step; balance and gait training  Pt to bring in knee braces he has at home   PT Home Exercise Plan 08/16/16 lower extremity strengthening   Consulted and Agree with Plan of Care Patient;Family member/caregiver   Family Member Consulted Wife      Patient will benefit from skilled therapeutic intervention in order to improve the following deficits and impairments:  Abnormal gait, Cardiopulmonary status limiting activity, Decreased activity tolerance, Decreased balance, Decreased endurance, Decreased mobility, Decreased range of motion, Decreased safety awareness, Difficulty walking, Decreased strength, Postural dysfunction, Pain  Visit Diagnosis: Muscle weakness (generalized)  Unsteadiness on feet  Other abnormalities of gait and  mobility     Problem List Patient Active Problem List   Diagnosis Date Noted  . Claudication in peripheral vascular disease ([place]) 06/10/2016  . Morbid obesity ([place]) 09/21/2015  . Upper airway cough syndrome 09/20/2015  . Cough variant  asthma 09/20/2015    Ashima Shrake W. 08/23/2016, 8:14 PM  [facility] [address] [place], [place], [address] Phone: [phone]   Fax:  [phone]  Name: DUAYNE BRIDEAU MRN: [id number] Date of Birth: October 18, 1940

## 2016-08-27 ENCOUNTER — Ambulatory Visit: Payer: Medicare Other | Admitting: Physical Therapy

## 2016-08-27 DIAGNOSIS — R2681 Unsteadiness on feet: Secondary | ICD-10-CM

## 2016-08-27 DIAGNOSIS — R2689 Other abnormalities of gait and mobility: Secondary | ICD-10-CM

## 2016-08-27 DIAGNOSIS — M6281 Muscle weakness (generalized): Secondary | ICD-10-CM

## 2016-08-27 NOTE — Therapy (Signed)
[facility] [address] [place], [place], [address] Phone: [phone]   Fax:  [phone]  Physical Therapy Treatment  Patient Details  Name: Andrew Larsen MRN: [id number] Date of Birth: 13-Feb-1941 Referring Provider: Einar Gip  Encounter Date: 08/27/2016      PT End of Session - 08/27/16 1401    Visit Number 6   Number of Visits 17   Date for PT Re-Evaluation 10/05/16   Authorization Type [place] required every 10th visit   PT Start Time 1322   PT Stop Time 1400   PT Time Calculation (min) 38 min      Past Medical History:  Diagnosis Date  . Daytime somnolence   . Diabetes mellitus without complication ([place])   . Fracture    right fibula/wears boot cast  . GERD (gastroesophageal reflux disease)   . Glaucoma   . Hypercholesteremia   . Hyperlipidemia   . Hypertension   . Mold exposure     Past Surgical History:  Procedure Laterality Date  . LOWER EXTREMITY ANGIOGRAPHY N/A 06/12/2016   Procedure: Lower Extremity Angiography;  Surgeon: Adrian Prows, MD;  Location: [facility];  Service: Cardiovascular;  Laterality: N/A;  . ORIF ANKLE FRACTURE Right 10/02/2012   Procedure: OPEN REDUCTION INTERNAL FIXATION (ORIF) ANKLE FRACTURE;  Surgeon: Meredith Pel, MD;  [facility];  Service: Orthopedics;  Laterality: Right;  . PERIPHERAL VASCULAR ATHERECTOMY Left 06/12/2016   Procedure: Peripheral Vascular Atherectomy-Left Popliteal;  Surgeon: Adrian Prows, MD;  Location: [facility];  Service: Cardiovascular;  Laterality: Left;  . PERIPHERAL VASCULAR INTERVENTION Left 06/12/2016   Procedure: Peripheral Vascular Intervention- DCB Left Popliteal;  Surgeon: Adrian Prows, MD;  Location: [facility];  Service: Cardiovascular;  Laterality: Left;    There were no vitals filed for this visit.      Subjective Assessment - 08/27/16 1322    Subjective Pt reports that L knee is more  swollen than last visit. Did not bring knee brace.  Pt goes to surgeon (angiography)   09/19/16. Just had a PCP appointment last week but did not discuss knee pain.   Patient is accompained by: Family member   Pertinent History PAD, HTN, hypercholesterolemia, chest pain, dyspnea, glaucoma, GERD, bilateral ankle injuries due to falling, R fibula fracture    Limitations Standing;Walking;House hold activities   Patient Stated Goals to stop falling    Currently in Pain? Yes   Pain Score 6    Pain Location Knee   Pain Orientation Left   Pain Descriptors / Indicators Sharp   Pain Type Chronic pain   Pain Onset More than a month ago   Pain Frequency Intermittent   Aggravating Factors  sitting or walking a lot.                         [facility] Adult PT Treatment/Exercise - 08/27/16 0001                   Exercises   Exercises Ankle     Knee/Hip Exercises: Stretches   Other Knee/Hip Stretches knee to chest stretch, 20 sec hold , 3x each   Other Knee/Hip Stretches lower trunk rotation stretch, supine, 20sec, x3 each side.     Ankle Exercises: Standing   Heel Raises 3 seconds;20 reps             Balance Exercises - 08/27/16 1338  Balance Exercises: Standing   Gait with Head Turns Forward;Intermittent upper extremity support;4 reps   Retro Gait solid surface;4 reps  UE support   Sidestepping Upper extremity support;4 reps   Marching Limitations forward; UE support, x4 reps, supervison           PT Education - 08/27/16 1547    Education provided Yes   Education Details Educated pt on knee to chest stretch, and lower trunk rotations; pt responded well with no increase in pain. Did not add at this time due to pt already having a current HEP.   Person(s) Educated Patient   Methods Explanation   Comprehension Verbalized understanding          PT Short Term Goals - 08/23/16 2012      PT SHORT TERM GOAL #1   Title Patient will verbalize understanding  of HEP in order to increase LE strength and balance deficits. (TARGET DATE: 09/07/2016)   Time 4   Period Weeks   Status On-going     PT SHORT TERM GOAL #2   Title Patient will achieve a TUG score of <18 seconds with a cane in order to demonstrate a decrease in his risk of falling. (TARGET DATE: 09/07/2016)    Time 4   Period Weeks   Status On-going     PT SHORT TERM GOAL #3   Title Patient will improve DGI score to > or = 15/24 to indicate a decrease in his risk of falling. (TARGET DATE: 09/07/2016)   Time 4   Period Weeks   Status On-going     PT SHORT TERM GOAL #4   Title Patient will verbalize understanding of a fall prevention plan to decrease his risk of falling. (TARGET DATE: 09/07/2016)   Time 4   Period Weeks   Status On-going           PT Long Term Goals - 08/23/16 2012      PT LONG TERM GOAL #1   Title Patient will verbalize understanding of modified activities and exercises to perform in the community (at [employer]). (TARGET DATE: 10/05/2016)   Time 8   Period Weeks   Status On-going     PT LONG TERM GOAL #2   Title Patient will have a gait speed of > or = 2.77ft/s indicating he is able to safely ambulate in the community. (TARGET DATE: 10/05/2016)   Time 8   Period Weeks   Status On-going     PT LONG TERM GOAL #3   Title Patient will achieve a TUG score of <14 seconds with LRAD to indicate a decrease in his risk of falling. (TARGET DATE: 10/05/2016)   Time 8   Period Weeks   Status On-going     PT LONG TERM GOAL #4   Title Patient will safely demonstrate a reciprocal, step-through gait pattern when ascending and descending stairs with LRAD and supervision. (TARGET DATE: 10/05/2016)   Time 8   Period Weeks   Status On-going     PT LONG TERM GOAL #5   Title Patient will achieve a score of >18/24 on the DGI in order to demonstrate a decrease in his risk of falling. (TARGET DATE: 10/05/2016)   Time 8   Period Weeks   Status On-going     PT LONG TERM GOAL #6    Title Pt will improve ABC scale score to at least 40% to demonstrate improved confidence with mobility.  TARGET 10/05/16   Time 8  Period Weeks   Status On-going               Plan - 08/27/16 1355    Clinical Impression Statement Worked on dynamic gait for balance; pt required UE support (wall in hall way). Educated pt on knee to chest stretch, and lower trunk rotations; pt responded well with no increase in pain.   Rehab Potential Good   Clinical Impairments Affecting Rehab Potential PAD, HTN, hypercholesterolemia, dyspnea, chest pain, glaucoma, GERD, bilateral ankle injuries, previous R fibula fracture (significant PMH)   PT Frequency 2x / week   PT Duration 8 weeks   PT Treatment/Interventions ADLs/Self Care Home Management;DME Instruction;Gait training;Stair training;Functional mobility training;Therapeutic activities;Therapeutic exercise;Balance training;Patient/family education;Neuromuscular re-education;Manual techniques   PT Next Visit Plan Continue to work on standing activities to address R knee strengthening-including terminal knee extension, hamstring strengthening, step ups and step downs from low step; balance and gait training  Pt to bring in knee braces he has at home   PT Home Exercise Plan 08/16/16 lower extremity strengthening   Consulted and Agree with Plan of Care Patient;Family member/caregiver   Family Member Consulted Wife      Patient will benefit from skilled therapeutic intervention in order to improve the following deficits and impairments:  Abnormal gait, Cardiopulmonary status limiting activity, Decreased activity tolerance, Decreased balance, Decreased endurance, Decreased mobility, Decreased range of motion, Decreased safety awareness, Difficulty walking, Decreased strength, Postural dysfunction, Pain  Visit Diagnosis: Muscle weakness (generalized)  Unsteadiness on feet  Other abnormalities of gait and mobility     Problem List Patient Active  Problem List   Diagnosis Date Noted  . Claudication in peripheral vascular disease ([place]) 06/10/2016  . Morbid obesity ([place]) 09/21/2015  . Upper airway cough syndrome 09/20/2015  . Cough variant asthma 09/20/2015    Bjorn Loser, PTA  08/27/16, 3:53 PM [facility] [address] [place], [place], [address] Phone: [phone]   Fax:  [phone]  Name: Andrew Larsen MRN: [id number] Date of Birth: 1941/04/13

## 2016-08-30 ENCOUNTER — Ambulatory Visit: Payer: Self-pay | Admitting: Physical Therapy

## 2016-08-31 ENCOUNTER — Encounter: Payer: Self-pay | Admitting: Physical Therapy

## 2016-08-31 ENCOUNTER — Ambulatory Visit: Payer: Medicare Other | Attending: Internal Medicine | Admitting: Physical Therapy

## 2016-08-31 DIAGNOSIS — R2681 Unsteadiness on feet: Secondary | ICD-10-CM | POA: Insufficient documentation

## 2016-08-31 DIAGNOSIS — R2689 Other abnormalities of gait and mobility: Secondary | ICD-10-CM | POA: Diagnosis present

## 2016-08-31 DIAGNOSIS — M6281 Muscle weakness (generalized): Secondary | ICD-10-CM | POA: Diagnosis present

## 2016-08-31 NOTE — Therapy (Signed)
[facility] [address] [place], [place], [address] Phone: [phone]   Fax:  [phone]  Physical Therapy Treatment  Patient Details  Name: Andrew Larsen MRN: [id number] Date of Birth: 12/19/1940 Referring Provider: Einar Gip  Encounter Date: 08/31/2016      PT End of Session - 08/31/16 1550    Visit Number 7   Number of Visits 17   Date for PT Re-Evaluation 10/05/16   Authorization Type [place] required every 10th visit   PT Start Time [phone]   PT Stop Time 0930   PT Time Calculation (min) 44 min   Equipment Utilized During Treatment Gait belt   Activity Tolerance Patient tolerated treatment well   Behavior During Therapy [facility] for tasks assessed/performed      Past Medical History:  Diagnosis Date  . Daytime somnolence   . Diabetes mellitus without complication ([place])   . Fracture    right fibula/wears boot cast  . GERD (gastroesophageal reflux disease)   . Glaucoma   . Hypercholesteremia   . Hyperlipidemia   . Hypertension   . Mold exposure     Past Surgical History:  Procedure Laterality Date  . LOWER EXTREMITY ANGIOGRAPHY N/A 06/12/2016   Procedure: Lower Extremity Angiography;  Surgeon: Adrian Prows, MD;  Location: [place] [facility];  Service: Cardiovascular;  Laterality: N/A;  . ORIF ANKLE FRACTURE Right 10/02/2012   Procedure: OPEN REDUCTION INTERNAL FIXATION (ORIF) ANKLE FRACTURE;  Surgeon: Meredith Pel, MD;  [facility];  Service: Orthopedics;  Laterality: Right;  . PERIPHERAL VASCULAR ATHERECTOMY Left 06/12/2016   Procedure: Peripheral Vascular Atherectomy-Left Popliteal;  Surgeon: Adrian Prows, MD;  Location: [place] [facility];  Service: Cardiovascular;  Laterality: Left;  . PERIPHERAL VASCULAR INTERVENTION Left 06/12/2016   Procedure: Peripheral Vascular Intervention- DCB Left Popliteal;  Surgeon: Adrian Prows, MD;  Location: [place] [facility];  Service: Cardiovascular;   Laterality: Left;    There were no vitals filed for this visit.      Subjective Assessment - 08/31/16 0848    Subjective Reports Rt knee continues to give out with near falls but has not fallen. Lt knee swells when he travels/sits for long periods.    Patient is accompained by: Family member   Pertinent History PAD, HTN, hypercholesterolemia, chest pain, dyspnea, glaucoma, GERD, bilateral ankle injuries due to falling, R fibula fracture    Limitations Standing;Walking;House hold activities   Patient Stated Goals to stop falling    Currently in Pain? No/denies   Pain Onset More than a month ago                         [facility] Adult PT Treatment/Exercise - 08/31/16 1540      Transfers   Transfers Sit to Stand;Stand to Sit   Sit to Stand 6: Modified independent (Device/Increase time)   Stand to Sit 6: Modified independent (Device/Increase time)   Number of Reps --  4   Transfer Cueing none     Ambulation/Gait   Ambulation/Gait Assistance 5: Supervision   Ambulation/Gait Assistance Details vc to control Rt knee hyperextension which was unsuccessful despite trial of carbon-fiber AFO even with 1/2 inch heel lift   Ambulation Distance (Feet) 60 Feet  120, 60   Assistive device Straight cane   Gait Pattern Decreased step length - left;Decreased stance time - right;Decreased dorsiflexion - right;Decreased dorsiflexion - left;Right genu recurvatum;Trendelenburg;Wide base of support;Step-through pattern  Ambulation Surface Level;Indoor   Gait Comments after trials of use of AFO, heel lift, and discussion of potential knee brace/swedish cage, pt began asking if it would be possible to strengthen knee and NOT need bracing. Explained only time would tell, however risks of further knee damage while trying to strengthen knee.      Posture/Postural Control   Posture/Postural Control Postural limitations   Postural Limitations Posterior pelvic tilt;Rounded Shoulders      Knee/Hip Exercises: Machines for Strengthening   Cybex Leg Press 50 # bil LE x 10; 50# RLE only x 10; 70# bil LE x 10; unable to do more than 2  reps at 70# with RLE only     Knee/Hip Exercises: Seated   Hamstring Curl Strengthening;Right;1 set;10 reps  black band (kept for gym use)                PT Education - 08/31/16 1549    Education provided Yes   Education Details Risks of no knee brace long-term; key to work on knee control and strengthening (quads and hamstrings); bracing options   Person(s) Educated Patient;Spouse   Methods Explanation;Demonstration   Comprehension Verbalized understanding;Need further instruction          PT Short Term Goals - 08/23/16 2012      PT SHORT TERM GOAL #1   Title Patient will verbalize understanding of HEP in order to increase LE strength and balance deficits. (TARGET DATE: 09/07/2016)   Time 4   Period Weeks   Status On-going     PT SHORT TERM GOAL #2   Title Patient will achieve a TUG score of <18 seconds with a cane in order to demonstrate a decrease in his risk of falling. (TARGET DATE: 09/07/2016)    Time 4   Period Weeks   Status On-going     PT SHORT TERM GOAL #3   Title Patient will improve DGI score to > or = 15/24 to indicate a decrease in his risk of falling. (TARGET DATE: 09/07/2016)   Time 4   Period Weeks   Status On-going     PT SHORT TERM GOAL #4   Title Patient will verbalize understanding of a fall prevention plan to decrease his risk of falling. (TARGET DATE: 09/07/2016)   Time 4   Period Weeks   Status On-going           PT Long Term Goals - 08/23/16 2012      PT LONG TERM GOAL #1   Title Patient will verbalize understanding of modified activities and exercises to perform in the community (at [employer]). (TARGET DATE: 10/05/2016)   Time 8   Period Weeks   Status On-going     PT LONG TERM GOAL #2   Title Patient will have a gait speed of > or = 2.79ft/s indicating he is able to safely ambulate in  the community. (TARGET DATE: 10/05/2016)   Time 8   Period Weeks   Status On-going     PT LONG TERM GOAL #3   Title Patient will achieve a TUG score of <14 seconds with LRAD to indicate a decrease in his risk of falling. (TARGET DATE: 10/05/2016)   Time 8   Period Weeks   Status On-going     PT LONG TERM GOAL #4   Title Patient will safely demonstrate a reciprocal, step-through gait pattern when ascending and descending stairs with LRAD and supervision. (TARGET DATE: 10/05/2016)   Time 8   Period  Weeks   Status On-going     PT LONG TERM GOAL #5   Title Patient will achieve a score of >18/24 on the DGI in order to demonstrate a decrease in his risk of falling. (TARGET DATE: 10/05/2016)   Time 8   Period Weeks   Status On-going     PT LONG TERM GOAL #6   Title Pt will improve ABC scale score to at least 40% to demonstrate improved confidence with mobility.  TARGET 10/05/16   Time 8   Period Weeks   Status On-going               Plan - 08/31/16 1551    Clinical Impression Statement Initial portion of session focused on gait training with trial Rt AFO (posterior carbon-fiber) and even added 1/2" heel lift to attempt to prevent Rt knee hyperextension in stance while walking. This combination was not successful and discussed with pt/wife option to get a referral for an orthotist so they can assist with whether a custom AFO or some kind of knee brace could work. Patient indicated strongly that he does not want to wear a brace. Remainder of session worked on Hotel manager and educating patient on appropriate use of similar equipment [facility] [employer] [facility] has a Higher education careers adviser.    Rehab Potential Good   Clinical Impairments Affecting Rehab Potential PAD, HTN, hypercholesterolemia, dyspnea, chest pain, glaucoma, GERD, bilateral ankle injuries, previous R fibula fracture (significant PMH)   PT Frequency 2x / week   PT Duration 8 weeks   PT Treatment/Interventions ADLs/Self Care Home Management;DME  Instruction;Gait training;Stair training;Functional mobility training;Therapeutic activities;Therapeutic exercise;Balance training;Patient/family education;Neuromuscular re-education;Manual techniques   PT Next Visit Plan Continue to work on standing activities to address R knee strengthening-including terminal knee extension, hamstring strengthening, step ups and step downs from low step; balance and gait training  Pt to bring in knee braces he has at home   PT Home Exercise Plan 08/16/16 lower extremity strengthening   Consulted and Agree with Plan of Care Patient;Family member/caregiver   Family Member Consulted Wife      Patient will benefit from skilled therapeutic intervention in order to improve the following deficits and impairments:  Abnormal gait, Cardiopulmonary status limiting activity, Decreased activity tolerance, Decreased balance, Decreased endurance, Decreased mobility, Decreased range of motion, Decreased safety awareness, Difficulty walking, Decreased strength, Postural dysfunction, Pain  Visit Diagnosis: Muscle weakness (generalized)  Unsteadiness on feet  Other abnormalities of gait and mobility     Problem List Patient Active Problem List   Diagnosis Date Noted  . Claudication in peripheral vascular disease ([place]) 06/10/2016  . Morbid obesity ([place]) 09/21/2015  . Upper airway cough syndrome 09/20/2015  . Cough variant asthma 09/20/2015    Rexanne Mano, PT 08/31/2016, 4:00 PM  [facility] [address] [place], [place], [address] Phone: [phone]   Fax:  [phone]  Name: Andrew Larsen MRN: [id number] Date of Birth: June 19, 1940

## 2016-09-04 ENCOUNTER — Ambulatory Visit: Payer: Medicare Other | Admitting: Physical Therapy

## 2016-09-05 ENCOUNTER — Ambulatory Visit: Payer: Medicare Other | Admitting: Physical Therapy

## 2016-09-05 DIAGNOSIS — R2681 Unsteadiness on feet: Secondary | ICD-10-CM

## 2016-09-05 DIAGNOSIS — M6281 Muscle weakness (generalized): Secondary | ICD-10-CM

## 2016-09-05 DIAGNOSIS — R2689 Other abnormalities of gait and mobility: Secondary | ICD-10-CM

## 2016-09-05 NOTE — Therapy (Signed)
[facility] [address] [place], [place], [address] Phone: [phone]   Fax:  [phone]  Physical Therapy Treatment  Patient Details  Name: Andrew Larsen MRN: [id number] Date of Birth: [birth year] Referring Provider: Einar Gip  Encounter Date: 09/05/2016      PT End of Session - 09/05/16 1118    Visit Number 8   Number of Visits 17   Date for PT Re-Evaluation 10/05/16   Authorization Type [place] required every 10th visit   PT Start Time 1021   PT Stop Time 1103   PT Time Calculation (min) 42 min   Equipment Utilized During Treatment Gait belt   Activity Tolerance Patient tolerated treatment well   Behavior During Therapy WFL for tasks assessed/performed      Past Medical History:  Diagnosis Date  . Daytime somnolence   . Diabetes mellitus without complication ([place])   . Fracture    right fibula/wears boot cast  . GERD (gastroesophageal reflux disease)   . Glaucoma   . Hypercholesteremia   . Hyperlipidemia   . Hypertension   . Mold exposure     Past Surgical History:  Procedure Laterality Date  . LOWER EXTREMITY ANGIOGRAPHY N/A 06/12/2016   Procedure: Lower Extremity Angiography;  Surgeon: Adrian Prows, MD;  Location: [facility] [place] [facility];  Service: Cardiovascular;  Laterality: N/A;  . ORIF ANKLE FRACTURE Right 10/02/2012   Procedure: OPEN REDUCTION INTERNAL FIXATION (ORIF) ANKLE FRACTURE;  Surgeon: Meredith Pel, MD;  [facility];  Service: Orthopedics;  Laterality: Right;  . PERIPHERAL VASCULAR ATHERECTOMY Left 06/12/2016   Procedure: Peripheral Vascular Atherectomy-Left Popliteal;  Surgeon: Adrian Prows, MD;  Location: [place] [facility];  Service: Cardiovascular;  Laterality: Left;  . PERIPHERAL VASCULAR INTERVENTION Left 06/12/2016   Procedure: Peripheral Vascular Intervention- DCB Left Popliteal;  Surgeon: Adrian Prows, MD;  Location: [place] [facility];  Service: Cardiovascular;   Laterality: Left;    There were no vitals filed for this visit.      Subjective Assessment - 09/05/16 1025    Subjective Was just very tired yesterday.  No falls, stumbles in the past few days.   Patient is accompained by: Family member   Pertinent History PAD, HTN, hypercholesterolemia, chest pain, dyspnea, glaucoma, GERD, bilateral ankle injuries due to falling, R fibula fracture    Limitations Standing;Walking;House hold activities   Patient Stated Goals to stop falling    Currently in Pain? No/denies   Pain Onset More than a month ago            [facility] PT Assessment - 09/05/16 1028      Strength   Strength Assessment Site Hip;Knee;Ankle   Right/Left Hip Right;Left   Right Hip Flexion 4/5   Left Hip Flexion 3+/5   Right/Left Knee Right;Left   Right Knee Flexion 4/5   Right Knee Extension 3-/5   Left Knee Flexion 4/5   Left Knee Extension 3+/5   Right/Left Ankle Right;Left   Right Ankle Dorsiflexion 3+/5   Left Ankle Dorsiflexion 3-/5                     [facility] Adult PT Treatment/Exercise - 09/05/16 1028      Transfers   Transfers Sit to Stand;Stand to Sit   Sit to Stand 6: Modified independent (Device/Increase time);With upper extremity assist;From bed;From chair/3-in-1   Stand to Sit 6: Modified independent (Device/Increase time);With upper extremity assist;To chair/3-in-1;To bed   Number of  Reps --  multiple reps through session     Ambulation/Gait   Ambulation/Gait Yes   Ambulation/Gait Assistance 5: Supervision   Ambulation/Gait Assistance Details Cues provided for improved heelstrike and push off of LLE with gait.  Pt able to correct gait pattern with cues for short distances.   Ambulation Distance (Feet) 60 Feet  x 2 in gym area, in addition to DGI and TUG   Assistive device Straight cane   Gait Pattern Decreased step length - left;Decreased stance time - right;Decreased dorsiflexion - left;Right genu recurvatum;Trendelenburg;Wide base of  support;Step-through pattern  Foot slap on LLE   Ambulation Surface Level;Indoor     Standardized Balance Assessment   Standardized Balance Assessment Dynamic Gait Index;Timed Up and Go Test     Dynamic Gait Index   Level Surface Mild Impairment   Change in Gait Speed Mild Impairment   Gait with Horizontal Head Turns Mild Impairment   Gait with Vertical Head Turns Mild Impairment   Gait and Pivot Turn Normal  4.19   Step Over Obstacle Moderate Impairment   Step Around Obstacles Mild Impairment   Steps Moderate Impairment   Total Score 15     Timed Up and Go Test   TUG Normal TUG   Normal TUG (seconds) 15.34     Self-Care   Self-Care Other Self-Care Comments   Other Self-Care Comments  Discussed results of DIG, TUG,with improvements noted, as well as improvements noted in MMT.  Discussed that pt continues to be at fall risk and needs to use cane for safety.  Discussed progress towards goals and pt's goals to continue therapy for additional strengthening and balance work.     Knee/Hip Exercises: Machines for Strengthening   Cybex Leg Press 50 # bil LE 2 sets x 10; 50# RLE only x 10; 70# LLE x 10     Knee/Hip Exercises: Seated   Hamstring Curl Strengthening;Right;1 set;10 reps  black band, kept for gym use     Ankle Exercises: Seated   Toe Raise 10 reps;3 seconds  LLE                PT Education - 09/05/16 1118    Education provided Yes   Education Details Review of objective measures, discussion on POC and pt's goals (to continue balance and strengthening), advised to use cane for optimal safety with gait   Person(s) Educated Patient;Spouse   Methods Explanation;Demonstration   Comprehension Verbalized understanding          PT Short Term Goals - 09/05/16 1027      PT SHORT TERM GOAL #1   Title Patient will verbalize understanding of HEP in order to increase LE strength and balance deficits. (TARGET DATE: 09/07/2016)   Time 4   Period Weeks   Status  On-going     PT SHORT TERM GOAL #2   Title Patient will achieve a TUG score of <18 seconds with a cane in order to demonstrate a decrease in his risk of falling. (TARGET DATE: 09/07/2016)    Baseline 15.34 sec 09/05/16   Time 4   Period Weeks   Status Achieved     PT SHORT TERM GOAL #3   Title Patient will improve DGI score to > or = 15/24 to indicate a decrease in his risk of falling. (TARGET DATE: 09/07/2016)   Baseline DGI score 15/24-improved from 11/24 on 09/05/16   Time 4   Period Weeks   Status Achieved     PT SHORT  TERM GOAL #4   Title Patient will verbalize understanding of a fall prevention plan to decrease his risk of falling. (TARGET DATE: 09/07/2016)   Time 4   Period Weeks   Status On-going           PT Long Term Goals - 08/23/16 2012      PT LONG TERM GOAL #1   Title Patient will verbalize understanding of modified activities and exercises to perform in the community (at [employer]). (TARGET DATE: 10/05/2016)   Time 8   Period Weeks   Status On-going     PT LONG TERM GOAL #2   Title Patient will have a gait speed of > or = 2.65ft/s indicating he is able to safely ambulate in the community. (TARGET DATE: 10/05/2016)   Time 8   Period Weeks   Status On-going     PT LONG TERM GOAL #3   Title Patient will achieve a TUG score of <14 seconds with LRAD to indicate a decrease in his risk of falling. (TARGET DATE: 10/05/2016)   Time 8   Period Weeks   Status On-going     PT LONG TERM GOAL #4   Title Patient will safely demonstrate a reciprocal, step-through gait pattern when ascending and descending stairs with LRAD and supervision. (TARGET DATE: 10/05/2016)   Time 8   Period Weeks   Status On-going     PT LONG TERM GOAL #5   Title Patient will achieve a score of >18/24 on the DGI in order to demonstrate a decrease in his risk of falling. (TARGET DATE: 10/05/2016)   Time 8   Period Weeks   Status On-going     PT LONG TERM GOAL #6   Title Pt will improve ABC scale  score to at least 40% to demonstrate improved confidence with mobility.  TARGET 10/05/16   Time 8   Period Weeks   Status On-going               Plan - 09/05/16 1119    Clinical Impression Statement Began assessing STGs, with pt meeting STG 2 and 3 for improved TUG and DGI measures.  Pt is improving with functional measures as well as improved strength per MMT.  However, pt remains at fall risk and would continue to benefit from skilled physical therapy to address strength, balance and gait training.  (Pt has not brought in any knee braces from home, and pt declined last session any further pursuing of brace).   Rehab Potential Good   Clinical Impairments Affecting Rehab Potential PAD, HTN, hypercholesterolemia, dyspnea, chest pain, glaucoma, GERD, bilateral ankle injuries, previous R fibula fracture (significant PMH)   PT Frequency 2x / week   PT Duration 8 weeks   PT Treatment/Interventions ADLs/Self Care Home Management;DME Instruction;Gait training;Stair training;Functional mobility training;Therapeutic activities;Therapeutic exercise;Balance training;Patient/family education;Neuromuscular re-education;Manual techniques   PT Next Visit Plan Review pt's HEP in order to check STG 1, discuss fall prevention for STG 4.  Continue quad, hamstring strengthening, L ankle dorsiflexor strengthening; dynamic balance and gait activities working on LLE heelstrike with gait.   PT Home Exercise Plan 08/16/16 lower extremity strengthening   Consulted and Agree with Plan of Care Patient;Family member/caregiver   Family Member Consulted Wife      Patient will benefit from skilled therapeutic intervention in order to improve the following deficits and impairments:  Abnormal gait, Cardiopulmonary status limiting activity, Decreased activity tolerance, Decreased balance, Decreased endurance, Decreased mobility, Decreased range of motion, Decreased safety  awareness, Difficulty walking, Decreased strength,  Postural dysfunction, Pain  Visit Diagnosis: Unsteadiness on feet  Other abnormalities of gait and mobility  Muscle weakness (generalized)     Problem List Patient Active Problem List   Diagnosis Date Noted  . Claudication in peripheral vascular disease ([place]) 06/10/2016  . Morbid obesity ([place]) 09/21/2015  . Upper airway cough syndrome 09/20/2015  . Cough variant asthma 09/20/2015    Alfonso Shackett W. 09/05/2016, 11:24 AM Frazier Butt., PT  [facility] [address] [place], [place], [address] Phone: [phone]   Fax:  [phone]  Name: KINGSLY KLOEPFER MRN: [id number] Date of Birth: 1941-04-15

## 2016-09-06 ENCOUNTER — Ambulatory Visit: Payer: Medicare Other | Admitting: Physical Therapy

## 2016-09-06 DIAGNOSIS — R2689 Other abnormalities of gait and mobility: Secondary | ICD-10-CM

## 2016-09-06 DIAGNOSIS — M6281 Muscle weakness (generalized): Secondary | ICD-10-CM | POA: Diagnosis not present

## 2016-09-06 DIAGNOSIS — R2681 Unsteadiness on feet: Secondary | ICD-10-CM

## 2016-09-06 NOTE — Patient Instructions (Signed)

## 2016-09-06 NOTE — Therapy (Signed)
[facility] [address] [place], [place], [address] Phone: [phone]   Fax:  [phone]  Physical Therapy Treatment  Patient Details  Name: Andrew Larsen MRN: [id number] Date of Birth: 1940/05/29 Referring Provider: Einar Gip  Encounter Date: 09/06/2016      PT End of Session - 09/06/16 1026    Visit Number 9   Number of Visits 17   Date for PT Re-Evaluation 10/05/16   Authorization Type [place] required every 10th visit   PT Start Time 0934   PT Stop Time 1015   PT Time Calculation (min) 41 min   Equipment Utilized During Treatment Gait belt   Activity Tolerance Patient tolerated treatment well   Behavior During Therapy [facility] for tasks assessed/performed      Past Medical History:  Diagnosis Date  . Daytime somnolence   . Diabetes mellitus without complication ([place])   . Fracture    right fibula/wears boot cast  . GERD (gastroesophageal reflux disease)   . Glaucoma   . Hypercholesteremia   . Hyperlipidemia   . Hypertension   . Mold exposure     Past Surgical History:  Procedure Laterality Date  . LOWER EXTREMITY ANGIOGRAPHY N/A 06/12/2016   Procedure: Lower Extremity Angiography;  Surgeon: Adrian Prows, MD;  Location: [facility];  Service: Cardiovascular;  Laterality: N/A;  . ORIF ANKLE FRACTURE Right 10/02/2012   Procedure: OPEN REDUCTION INTERNAL FIXATION (ORIF) ANKLE FRACTURE;  Surgeon: Meredith Pel, MD;  Location: [facility];  Service: Orthopedics;  Laterality: Right;  . PERIPHERAL VASCULAR ATHERECTOMY Left 06/12/2016   Procedure: Peripheral Vascular Atherectomy-Left Popliteal;  Surgeon: Adrian Prows, MD;  Location: [facility];  Service: Cardiovascular;  Laterality: Left;  . PERIPHERAL VASCULAR INTERVENTION Left 06/12/2016   Procedure: Peripheral Vascular Intervention- DCB Left Popliteal;  Surgeon: Adrian Prows, MD;  Location: [facility];  Service: Cardiovascular;   Laterality: Left;    There were no vitals filed for this visit.      Subjective Assessment - 09/06/16 0936    Subjective Feel like when I'm concentrating on my L foot, I can clear it better without the slap.   Patient is accompained by: Family member   Pertinent History PAD, HTN, hypercholesterolemia, chest pain, dyspnea, glaucoma, GERD, bilateral ankle injuries due to falling, R fibula fracture    Limitations Standing;Walking;House hold activities   Patient Stated Goals to stop falling    Currently in Pain? No/denies   Pain Onset More than a month ago            [facility] PT Assessment - 09/05/16 1028      Strength   Strength Assessment Site Hip;Knee;Ankle   Right/Left Hip Right;Left   Right Hip Flexion 4/5   Left Hip Flexion 3+/5   Right/Left Knee Right;Left   Right Knee Flexion 4/5   Right Knee Extension 3-/5   Left Knee Flexion 4/5   Left Knee Extension 3+/5   Right/Left Ankle Right;Left   Right Ankle Dorsiflexion 3+/5   Left Ankle Dorsiflexion 3-/5                     [facility] Adult PT Treatment/Exercise - 09/06/16 0001      Transfers   Transfers Sit to Stand;Stand to Sit   Sit to Stand 6: Modified independent (Device/Increase time)   Stand to Sit 6: Modified independent (Device/Increase time);To bed   Number of Reps 1 set  5 reps  Ambulation/Gait   Ambulation/Gait Yes   Ambulation/Gait Assistance 5: Supervision   Ambulation/Gait Assistance Details Cues provided for improved heelstrike and push-off with gait.  Pt able to correct gait pattern with cues for short distances.   Ambulation Distance (Feet) 80 Feet  x 3, then 25 ft x 2   Assistive device Straight cane   Gait Pattern Decreased step length - left;Decreased stance time - right;Decreased dorsiflexion - left;Right genu recurvatum;Trendelenburg;Wide base of support;Step-through pattern   Ambulation Surface Level;Indoor   Pre-Gait Activities In parallel bars:  forward step over obstacle, x 8  reps each, side step and weightshift x 8 reps for improved step length/weight shift; stagger stance forward/back weightshifting x 10 reps for improved dynamic balance and ankle dorsiflexion for heelstrike     High Level Balance   High Level Balance Activities Side stepping;Backward walking  Forward/back walking along counter   High Level Balance Comments 3 reps x 10 ft (length of counter), for each of the above exercises, UE needed.     Self-Care   Self-Care Other Self-Care Comments   Other Self-Care Comments  Fall prevention education/discussion and handout provided.  Brief discussion on use of seated stepper as additional option for exercise at [facility] in addition to recumbent bike.     Knee/Hip Exercises: Aerobic   Stepper [employer] Seated Stepper, Level 1.5>2, 4 extremities x 8 minutes, RPM >60-70 and cues for full knee extension, for leg strengthening     Knee/Hip Exercises: Standing   Wall Squat 1 set;10 reps  Very gentle squat; pt fatigued by 9th rep      Self Care:  Verbally reviewed HEP-pt verbalizes understanding, but does report possible not doing it as much as he should.          PT Education - 09/06/16 1025    Education provided Yes   Education Details Fall prevention, use of seated stepper at [facility] if it is available there   Person(s) Educated Patient;Spouse   Methods Explanation;Handout   Comprehension Verbalized understanding          PT Short Term Goals - 09/06/16 1027      PT SHORT TERM GOAL #1   Title Patient will verbalize understanding of HEP in order to increase LE strength and balance deficits. (TARGET DATE: 09/07/2016)   Time 4   Period Weeks   Status Achieved     PT SHORT TERM GOAL #2   Title Patient will achieve a TUG score of <18 seconds with a cane in order to demonstrate a decrease in his risk of falling. (TARGET DATE: 09/07/2016)    Baseline 15.34 sec 09/05/16   Time 4   Period Weeks   Status Achieved     PT SHORT TERM GOAL #3   Title Patient  will improve DGI score to > or = 15/24 to indicate a decrease in his risk of falling. (TARGET DATE: 09/07/2016)   Baseline DGI score 15/24-improved from 11/24 on 09/05/16   Time 4   Period Weeks   Status Achieved     PT SHORT TERM GOAL #4   Title Patient will verbalize understanding of a fall prevention plan to decrease his risk of falling. (TARGET DATE: 09/07/2016)   Time 4   Period Weeks   Status Achieved           PT Long Term Goals - 08/23/16 2012      PT LONG TERM GOAL #1   Title Patient will verbalize understanding of modified activities and exercises  to perform in the community (at [employer]). (TARGET DATE: 10/05/2016)   Time 8   Period Weeks   Status On-going     PT LONG TERM GOAL #2   Title Patient will have a gait speed of > or = 2.26f/s indicating he is able to safely ambulate in the community. (TARGET DATE: 10/05/2016)   Time 8   Period Weeks   Status On-going     PT LONG TERM GOAL #3   Title Patient will achieve a TUG score of <14 seconds with LRAD to indicate a decrease in his risk of falling. (TARGET DATE: 10/05/2016)   Time 8   Period Weeks   Status On-going     PT LONG TERM GOAL #4   Title Patient will safely demonstrate a reciprocal, step-through gait pattern when ascending and descending stairs with LRAD and supervision. (TARGET DATE: 10/05/2016)   Time 8   Period Weeks   Status On-going     PT LONG TERM GOAL #5   Title Patient will achieve a score of >18/24 on the DGI in order to demonstrate a decrease in his risk of falling. (TARGET DATE: 10/05/2016)   Time 8   Period Weeks   Status On-going     PT LONG TERM GOAL #6   Title Pt will improve ABC scale score to at least 40% to demonstrate improved confidence with mobility.  TARGET 10/05/16   Time 8   Period Weeks   Status On-going               Plan - 09/06/16 1027    Clinical Impression Statement PT has met STG #1 for HEP and STG #4 for fall prevention.  Addressed strength and pre-gait activities  today, to work on increased heelstrike on LLE and continued awareness of neutral R knee positioning to reduce R knee recurvatum.  Pt reports fatigue at end of exercises in session today.  Pt will continue to benefit from skilled PT to address balance, strength, and gait training.  Pt to miss next week, as he is out of town on vacation.   Rehab Potential Good   Clinical Impairments Affecting Rehab Potential PAD, HTN, hypercholesterolemia, dyspnea, chest pain, glaucoma, GERD, bilateral ankle injuries, previous R fibula fracture (significant PMH)   PT Frequency 2x / week   PT Duration 8 weeks   PT Treatment/Interventions ADLs/Self Care Home Management;DME Instruction;Gait training;Stair training;Functional mobility training;Therapeutic activities;Therapeutic exercise;Balance training;Patient/family education;Neuromuscular re-education;Manual techniques   PT Next Visit Plan Continue quad, hamstring, L ankle dorsiflexor strengthening, dynamic balance and gait activities-focus on L heelstrike/decreased R knee recurvatum  GCODE NEXT VISIT   PT Home Exercise Plan 08/16/16 lower extremity strengthening   Consulted and Agree with Plan of Care Patient;Family member/caregiver   Family Member Consulted Wife      Patient will benefit from skilled therapeutic intervention in order to improve the following deficits and impairments:  Abnormal gait, Cardiopulmonary status limiting activity, Decreased activity tolerance, Decreased balance, Decreased endurance, Decreased mobility, Decreased range of motion, Decreased safety awareness, Difficulty walking, Decreased strength, Postural dysfunction, Pain  Visit Diagnosis: Muscle weakness (generalized)  Unsteadiness on feet  Other abnormalities of gait and mobility     Problem List Patient Active Problem List   Diagnosis Date Noted  . Claudication in peripheral vascular disease ([place] 06/10/2016  . Morbid obesity ([place] 09/21/2015  . Upper airway cough syndrome  09/20/2015  . Cough variant asthma 09/20/2015    MARRIOTT,AMY W. 09/06/2016, 10:31 AM MFrazier Butt, PT [facility] [address] [place], [place], [address] Phone: [phone]   Fax:  [phone]  Name: Andrew Larsen MRN: [id number] Date of Birth: 09/24/1940

## 2016-09-17 ENCOUNTER — Ambulatory Visit: Payer: Medicare Other | Admitting: Physical Therapy

## 2016-09-17 ENCOUNTER — Encounter: Payer: Self-pay | Admitting: Physical Therapy

## 2016-09-17 DIAGNOSIS — M6281 Muscle weakness (generalized): Secondary | ICD-10-CM | POA: Diagnosis not present

## 2016-09-17 DIAGNOSIS — R2689 Other abnormalities of gait and mobility: Secondary | ICD-10-CM

## 2016-09-17 DIAGNOSIS — R2681 Unsteadiness on feet: Secondary | ICD-10-CM

## 2016-09-17 NOTE — Patient Instructions (Addendum)
  Bridging    Slowly raise buttocks from floor, keeping stomach tight. Hold 5 seconds. Repeat __10__ times per set. Do _2___ sets per session. Do ___1_ sessions per day.  http://orth.exer.us/1096   Copyright  VHI. All rights reserved.

## 2016-09-17 NOTE — Therapy (Addendum)
[facility] [address] [place], [place], [address] Phone: [phone]   Fax:  [phone]  Physical Therapy Treatment  Patient Details  Name: Andrew Larsen MRN: [id number] Date of Birth: 22-Mar-1941 Referring Provider: Einar Gip  Encounter Date: 09/17/2016      PT End of Session - 09/17/16 1222    Visit Number 10   Number of Visits 17   Date for PT Re-Evaluation 10/05/16   Authorization Type [place] required every 10th visit   PT Start Time 1108   PT Stop Time 1148   PT Time Calculation (min) 40 min   Equipment Utilized During Treatment Gait belt   Activity Tolerance Patient tolerated treatment well   Behavior During Therapy WFL for tasks assessed/performed      Past Medical History:  Diagnosis Date  . Daytime somnolence   . Diabetes mellitus without complication ([place])   . Fracture    right fibula/wears boot cast  . GERD (gastroesophageal reflux disease)   . Glaucoma   . Hypercholesteremia   . Hyperlipidemia   . Hypertension   . Mold exposure     Past Surgical History:  Procedure Laterality Date  . LOWER EXTREMITY ANGIOGRAPHY N/A 06/12/2016   Procedure: Lower Extremity Angiography;  Surgeon: Adrian Prows, MD;  Location: [facility];  Service: Cardiovascular;  Laterality: N/A;  . ORIF ANKLE FRACTURE Right 10/02/2012   Procedure: OPEN REDUCTION INTERNAL FIXATION (ORIF) ANKLE FRACTURE;  Surgeon: Meredith Pel, MD;  [facility];  Service: Orthopedics;  Laterality: Right;  . PERIPHERAL VASCULAR ATHERECTOMY Left 06/12/2016   Procedure: Peripheral Vascular Atherectomy-Left Popliteal;  Surgeon: Adrian Prows, MD;  Location: [facility];  Service: Cardiovascular;  Laterality: Left;  . PERIPHERAL VASCULAR INTERVENTION Left 06/12/2016   Procedure: Peripheral Vascular Intervention- DCB Left Popliteal;  Surgeon: Adrian Prows, MD;  Location: [facility];  Service:  Cardiovascular;  Laterality: Left;    There were no vitals filed for this visit.      Subjective Assessment - 09/17/16 1111    Subjective Pt reports balance is still not right. x1 fall last week; bending forward, left knee gave way and fell backwards, no injury.   Patient is accompained by: Family member   Pertinent History PAD, HTN, hypercholesterolemia, chest pain, dyspnea, glaucoma, GERD, bilateral ankle injuries due to falling, R fibula fracture    Limitations Standing;Walking;House hold activities   Patient Stated Goals to stop falling    Currently in Pain? No/denies   Pain Onset More than a month ago            [facility] PT Assessment - 09/17/16 0001      Timed Up and Go Test   TUG Normal TUG   Normal TUG (seconds) 17.16  no cane   Cognitive TUG (seconds) 22.72  with [facility] Adult PT Treatment/Exercise - 09/17/16 0001      Ambulation/Gait   Ambulation/Gait Yes   Ambulation/Gait Assistance 6: Modified independent (Device/Increase time)   Ambulation/Gait Assistance Details gait velocity and TUG   Assistive device Straight cane   Gait Pattern Decreased step length - left;Decreased stance time - right;Decreased dorsiflexion - left;Right genu recurvatum;Trendelenburg;Wide base of support;Step-through pattern   Ambulation Surface Level;Indoor   Gait velocity 2.16f/sec with cane   Stairs Yes   Stairs Assistance 5: [place]  Details (indicate cue type and reason) attempting reciprocal pattern; pt unable due to R knee instability   Stair Management Technique Two rails;Step to pattern   Number of Stairs 4  x2     Knee/Hip Exercises: Supine   Bridges Limitations normal bridge with 5 sec hold  10x2             Balance Exercises - 09/17/16 1228      Balance Exercises: Standing   Step Ups 4 inch;UE support 2  leading with R LE; knee continues to hyperextend and buckle.   Sit to Stand Time attempted  sit<>stands with no UE support, but pt required at least 1 UE support at supervision level with cues for body mechanics.           PT Education - 09/17/16 1116    Education provided Yes   Education Details Encouraged pt to follow up with PCP about possible orthopedic referral. Added bridging and sit <>stands with only 1 hand support.   Person(s) Educated Patient;Spouse   Methods Explanation;Verbal cues;Handout   Comprehension Verbalized understanding;Returned demonstration;Verbal cues required          PT Short Term Goals - 09/06/16 1027      PT SHORT TERM GOAL #1   Title Patient will verbalize understanding of HEP in order to increase LE strength and balance deficits. (TARGET DATE: 09/07/2016)   Time 4   Period Weeks   Status Achieved     PT SHORT TERM GOAL #2   Title Patient will achieve a TUG score of <18 seconds with a cane in order to demonstrate a decrease in his risk of falling. (TARGET DATE: 09/07/2016)    Baseline 15.34 sec 09/05/16   Time 4   Period Weeks   Status Achieved     PT SHORT TERM GOAL #3   Title Patient will improve DGI score to > or = 15/24 to indicate a decrease in his risk of falling. (TARGET DATE: 09/07/2016)   Baseline DGI score 15/24-improved from 11/24 on 09/05/16   Time 4   Period Weeks   Status Achieved     PT SHORT TERM GOAL #4   Title Patient will verbalize understanding of a fall prevention plan to decrease his risk of falling. (TARGET DATE: 09/07/2016)   Time 4   Period Weeks   Status Achieved           PT Long Term Goals - 08/23/16 2012      PT LONG TERM GOAL #1   Title Patient will verbalize understanding of modified activities and exercises to perform in the community (at [employer]). (TARGET DATE: 10/05/2016)   Time 8   Period Weeks   Status On-going     PT LONG TERM GOAL #2   Title Patient will have a gait speed of > or = 2.95f/s indicating he is able to safely ambulate in the community. (TARGET DATE: 10/05/2016)   Time 8    Period Weeks   Status On-going     PT LONG TERM GOAL #3   Title Patient will achieve a TUG score of <14 seconds with LRAD to indicate a decrease in his risk of falling. (TARGET DATE: 10/05/2016)   Time 8   Period Weeks   Status On-going     PT LONG TERM GOAL #4   Title Patient will safely demonstrate a reciprocal, step-through gait pattern when ascending and descending stairs with LRAD and supervision. (TARGET DATE: 10/05/2016)   Time 8  Period Weeks   Status On-going     PT LONG TERM GOAL #5   Title Patient will achieve a score of >18/24 on the DGI in order to demonstrate a decrease in his risk of falling. (TARGET DATE: 10/05/2016)   Time 8   Period Weeks   Status On-going     PT LONG TERM GOAL #6   Title Pt will improve ABC scale score to at least 40% to demonstrate improved confidence with mobility.  TARGET 10/05/16   Time 8   Period Weeks   Status On-going               Plan - 22-Sep-2016 1221    Clinical Impression Statement Checked G-code; pt demonstrates progress with gait velocity and functional balance during gait. Pt did have a fall last week; when bending over his Right knee gave way. Added/updated HEP with progressed LE strengthening.  Pt's R knee continues to demonstrate instability with step negotiation and sit <>stands with decreased UE support .                                                Rehab Potential Good   Clinical Impairments Affecting Rehab Potential PAD, HTN, hypercholesterolemia, dyspnea, chest pain, glaucoma, GERD, bilateral ankle injuries, previous R fibula fracture (significant PMH)   PT Frequency 2x / week   PT Duration 8 weeks   PT Treatment/Interventions ADLs/Self Care Home Management;DME Instruction;Gait training;Stair training;Functional mobility training;Therapeutic activities;Therapeutic exercise;Balance training;Patient/family education;Neuromuscular re-education;Manual techniques   PT Next Visit Plan Continue quad, hamstring, L ankle  dorsiflexor strengthening, dynamic balance and gait activities-focus on L heelstrike/decreased R knee recurvatum  GCODE NEXT VISIT   PT Home Exercise Plan 08/16/16 lower extremity strengthening   Consulted and Agree with Plan of Care Patient;Family member/caregiver   Family Member Consulted Wife      Patient will benefit from skilled therapeutic intervention in order to improve the following deficits and impairments:  Abnormal gait, Cardiopulmonary status limiting activity, Decreased activity tolerance, Decreased balance, Decreased endurance, Decreased mobility, Decreased range of motion, Decreased safety awareness, Difficulty walking, Decreased strength, Postural dysfunction, Pain  Visit Diagnosis: Muscle weakness (generalized)  Unsteadiness on feet  Other abnormalities of gait and mobility       G-Codes - 2016-09-22 1225    Functional Assessment Tool Used (Outpatient Only)  gait velocity 2.22 ft/sec, TUG 17.16sec, TUG cog 22.72sec, DGI 15/24, 1 fall week of 2022/09/15.         G-Codes - 22-Sep-2016 1225    Functional Assessment Tool Used (Outpatient Only)  gait velocity 2.22 ft/sec, TUG 17.16sec, TUG cog 22.72sec, DGI 15/24, 1 fall week of Sep 15, 2022.   Functional Limitation Mobility: Walking and moving around   Mobility: Walking and Moving Around Current Status [id number]) At least 20 percent but less than 40 percent impaired, limited or restricted   Mobility: Walking and Moving Around Goal Status [id number]) At least 20 percent but less than 40 percent impaired, limited or restricted     Problem List Patient Active Problem List   Diagnosis Date Noted  . Claudication in peripheral vascular disease ([place]) 06/10/2016  . Morbid obesity ([place]) 09/21/2015  . Upper airway cough syndrome 09/20/2015  . Cough variant asthma 09/20/2015   Bjorn Loser, PTA  Sep 22, 2016, 12:42 PM [facility] [address] [place], [place], [address] Phone:  [phone]   Fax:  [phone]  Name: Andrew Larsen MRN: [id number] Date of Birth: 1941-03-22  Mady Haagensen, PT 09/18/16 12:48 PM Phone: [phone] Fax: [phone]   Physical Therapy Progress Note  Dates of Reporting Period: 08/10/16 to 09/17/16  Objective Reports of Subjective Statement: Pt has only had one fall since PT eval.  R knee buckled and gave way upon picking something up from the floor.  Objective Measurements:      gait velocity 2.22 ft/sec, TUG 17.16sec, TUG cog 22.72sec, DGI 15/24, 1 fall week of 5/13. Goal Update: Pt has met all STGs and pt is progressing towards LTGs.  Plan: Continue strengthening and balance towards LTGs  Reason Skilled Services are Required: Pt is progressing towards goals, but remains at fall risk.  Pt continues to have episodes of R knee giving way, which has recently caused one fall.  Pt would continue to benefit from skilled PT to improve functional mobility and decrease fall risk.  Mady Haagensen, PT 09/18/16 12:55 PM Phone: [phone] Fax: [phone]

## 2016-09-19 ENCOUNTER — Ambulatory Visit: Payer: Medicare Other | Admitting: Physical Therapy

## 2016-09-25 ENCOUNTER — Ambulatory Visit: Payer: Medicare Other | Admitting: Physical Therapy

## 2016-09-25 ENCOUNTER — Encounter: Payer: Self-pay | Admitting: Physical Therapy

## 2016-09-25 DIAGNOSIS — M6281 Muscle weakness (generalized): Secondary | ICD-10-CM | POA: Diagnosis not present

## 2016-09-25 DIAGNOSIS — R2681 Unsteadiness on feet: Secondary | ICD-10-CM

## 2016-09-25 NOTE — Therapy (Signed)
[facility] [address] [place], [place], [address] Phone: [phone]   Fax:  [phone]  Physical Therapy Treatment  Patient Details  Name: Andrew Larsen MRN: [id number] Date of Birth: [birth year] Referring Provider: Einar Gip  Encounter Date: 09/25/2016      PT End of Session - 09/25/16 1642    Visit Number 11   Number of Visits 17   Date for PT Re-Evaluation 10/05/16   Authorization Type [place] required every 10th visit   PT Start Time 1445   PT Stop Time 1528   PT Time Calculation (min) 43 min   Activity Tolerance Patient tolerated treatment well   Behavior During Therapy [facility] for tasks assessed/performed      Past Medical History:  Diagnosis Date  . Daytime somnolence   . Diabetes mellitus without complication ([place])   . Fracture    right fibula/wears boot cast  . GERD (gastroesophageal reflux disease)   . Glaucoma   . Hypercholesteremia   . Hyperlipidemia   . Hypertension   . Mold exposure     Past Surgical History:  Procedure Laterality Date  . LOWER EXTREMITY ANGIOGRAPHY N/A 06/12/2016   Procedure: Lower Extremity Angiography;  Surgeon: Adrian Prows, MD;  Location: [facility];  Service: Cardiovascular;  Laterality: N/A;  . ORIF ANKLE FRACTURE Right 10/02/2012   Procedure: OPEN REDUCTION INTERNAL FIXATION (ORIF) ANKLE FRACTURE;  Surgeon: Meredith Pel, MD;  Location: [facility];  Service: Orthopedics;  Laterality: Right;  . PERIPHERAL VASCULAR ATHERECTOMY Left 06/12/2016   Procedure: Peripheral Vascular Atherectomy-Left Popliteal;  Surgeon: Adrian Prows, MD;  Location: [facility];  Service: Cardiovascular;  Laterality: Left;  . PERIPHERAL VASCULAR INTERVENTION Left 06/12/2016   Procedure: Peripheral Vascular Intervention- DCB Left Popliteal;  Surgeon: Adrian Prows, MD;  Location: [facility];  Service: Cardiovascular;  Laterality: Left;    There were no vitals  filed for this visit.      Subjective Assessment - 09/25/16 1446    Subjective Denies any recent falls. Has only been doing the back exercises recently due to back pain.    Patient is accompained by: Family member   Pertinent History PAD, HTN, hypercholesterolemia, chest pain, dyspnea, glaucoma, GERD, bilateral ankle injuries due to falling, R fibula fracture    Limitations Standing;Walking;House hold activities   Patient Stated Goals to stop falling    Currently in Pain? No/denies   Pain Onset More than a month ago                         [facility] Adult PT Treatment/Exercise - 09/25/16 0001      Knee/Hip Exercises: Aerobic   Nustep L2 x 3 minutes; 4 extremities (warm-up)     Knee/Hip Exercises: Machines for Strengthening   Cybex Leg Press 70# bil LE; 50# RLE only, LLE only x 15 reps each     Knee/Hip Exercises: Standing   Knee Flexion Strengthening;Right;1 set;15 reps  yellow band, standing bil UE support   Terminal Knee Extension Strengthening;Right;1 set;15 reps;Theraband  red band   Forward Step Up Both;1 set;20 reps;Hand Hold: 2;Step Height: 2";Step Height: 4"  Rt 2"; Lt 4"; focus knee control   SLS RLE in // bars with light UE support x 30 sec with knee control     Knee/Hip Exercises: Supine   Bridges Limitations normal bridge 5 sec hold; bridging with band/abdct x 10   Other Supine Knee/Hip  Exercises hooklying hip abduction with blue band x 10 each (unilateral)                  PT Short Term Goals - 09/06/16 1027      PT SHORT TERM GOAL #1   Title Patient will verbalize understanding of HEP in order to increase LE strength and balance deficits. (TARGET DATE: 09/07/2016)   Time 4   Period Weeks   Status Achieved     PT SHORT TERM GOAL #2   Title Patient will achieve a TUG score of <18 seconds with a cane in order to demonstrate a decrease in his risk of falling. (TARGET DATE: 09/07/2016)    Baseline 15.34 sec 09/05/16   Time 4   Period Weeks    Status Achieved     PT SHORT TERM GOAL #3   Title Patient will improve DGI score to > or = 15/24 to indicate a decrease in his risk of falling. (TARGET DATE: 09/07/2016)   Baseline DGI score 15/24-improved from 11/24 on 09/05/16   Time 4   Period Weeks   Status Achieved     PT SHORT TERM GOAL #4   Title Patient will verbalize understanding of a fall prevention plan to decrease his risk of falling. (TARGET DATE: 09/07/2016)   Time 4   Period Weeks   Status Achieved           PT Long Term Goals - 08/23/16 2012      PT LONG TERM GOAL #1   Title Patient will verbalize understanding of modified activities and exercises to perform in the community (at [employer]). (TARGET DATE: 10/05/2016)   Time 8   Period Weeks   Status On-going     PT LONG TERM GOAL #2   Title Patient will have a gait speed of > or = 2.64ft/s indicating he is able to safely ambulate in the community. (TARGET DATE: 10/05/2016)   Time 8   Period Weeks   Status On-going     PT LONG TERM GOAL #3   Title Patient will achieve a TUG score of <14 seconds with LRAD to indicate a decrease in his risk of falling. (TARGET DATE: 10/05/2016)   Time 8   Period Weeks   Status On-going     PT LONG TERM GOAL #4   Title Patient will safely demonstrate a reciprocal, step-through gait pattern when ascending and descending stairs with LRAD and supervision. (TARGET DATE: 10/05/2016)   Time 8   Period Weeks   Status On-going     PT LONG TERM GOAL #5   Title Patient will achieve a score of >18/24 on the DGI in order to demonstrate a decrease in his risk of falling. (TARGET DATE: 10/05/2016)   Time 8   Period Weeks   Status On-going     PT LONG TERM GOAL #6   Title Pt will improve ABC scale score to at least 40% to demonstrate improved confidence with mobility.  TARGET 10/05/16   Time 8   Period Weeks   Status On-going               Plan - 09/25/16 1642    Clinical Impression Statement Session focused on bil LE  strengthening and Rt knee control. Again discussed possible use of brace for RLE and pt is not interested. He did state he was going to make an appt with ortho MD to discuss knee problems. Continue to progress towards goals.  Rehab Potential Good   Clinical Impairments Affecting Rehab Potential PAD, HTN, hypercholesterolemia, dyspnea, chest pain, glaucoma, GERD, bilateral ankle injuries, previous R fibula fracture (significant PMH)   PT Frequency 2x / week   PT Duration 8 weeks   PT Treatment/Interventions ADLs/Self Care Home Management;DME Instruction;Gait training;Stair training;Functional mobility training;Therapeutic activities;Therapeutic exercise;Balance training;Patient/family education;Neuromuscular re-education;Manual techniques   PT Next Visit Plan Continue quad, hamstring, L ankle dorsiflexor strengthening, dynamic balance and gait activities-focus on L heelstrike/decreased R knee recurvatum   PT Home Exercise Plan 08/16/16 lower extremity strengthening   Consulted and Agree with Plan of Care Patient;Family member/caregiver   Family Member Consulted Wife      Patient will benefit from skilled therapeutic intervention in order to improve the following deficits and impairments:  Abnormal gait, Cardiopulmonary status limiting activity, Decreased activity tolerance, Decreased balance, Decreased endurance, Decreased mobility, Decreased range of motion, Decreased safety awareness, Difficulty walking, Decreased strength, Postural dysfunction, Pain  Visit Diagnosis: Muscle weakness (generalized)  Unsteadiness on feet     Problem List Patient Active Problem List   Diagnosis Date Noted  . Claudication in peripheral vascular disease ([place]) 06/10/2016  . Morbid obesity ([place]) 09/21/2015  . Upper airway cough syndrome 09/20/2015  . Cough variant asthma 09/20/2015    Rexanne Mano, PT 09/25/2016, 4:47 PM  [facility] [address]  [place], [place], [address] Phone: [phone]   Fax:  [phone]  Name: Andrew Larsen MRN: [id number] Date of Birth: 1940-09-30

## 2016-09-27 ENCOUNTER — Encounter: Payer: Self-pay | Admitting: Physical Therapy

## 2016-09-27 ENCOUNTER — Ambulatory Visit: Payer: Medicare Other | Admitting: Physical Therapy

## 2016-09-27 NOTE — Therapy (Signed)
[facility] [address] [place], [place], [address] Phone: [phone]   Fax:  [phone]  Patient Details  Name: Andrew Larsen MRN: [id number] Date of Birth: 10-Jul-1940 Referring Provider:  No ref. provider found  Encounter Date: 09/27/2016  Called by bystander to the main lobby of the building due to pt fall. On arrival, pt reported he was OK and didn't think he was seriously hurt. Assisted with min assist floor to chair transfer. After resting, attempted to walk into clinic and pt with pain in Rt knee and ankle and only tolerated 4 ft with wheelchair brought to him.   Provided ice and elevation of rt ankle while discussing with pt/wife what course of action they wanted to pursue. Pt refused ambulance and insisted nothing was broken because "I've broken this ankle many times and it doesn't feel broken." Patient reports he now wants to pursue a brace to support his knee and has plan to make an appt with an orthopedic doctor. Provided written information re: Rt AFO as likely appropriate brace for MD to order.  Assisted pt into their car and instructed wife to take patient to Urgent care or ED for xrays if their grandson cannot safely assist him into their home.   Jeanie Cooks Kunta Hilleary 09/27/2016, 10:06 AM  [facility] [address] [place], [place], [address] Phone: [phone]   Fax:  [phone]

## 2016-10-01 ENCOUNTER — Ambulatory Visit: Payer: Medicare Other | Admitting: Physical Therapy

## 2016-10-04 ENCOUNTER — Ambulatory Visit: Payer: Self-pay | Admitting: Physical Therapy

## 2016-10-09 ENCOUNTER — Telehealth: Payer: Self-pay | Admitting: Physical Therapy

## 2016-10-09 ENCOUNTER — Ambulatory Visit: Payer: Medicare Other | Admitting: Physical Therapy

## 2016-10-09 ENCOUNTER — Other Ambulatory Visit: Payer: Self-pay | Admitting: Family Medicine

## 2016-10-09 DIAGNOSIS — S39012A Strain of muscle, fascia and tendon of lower back, initial encounter: Secondary | ICD-10-CM

## 2016-10-09 DIAGNOSIS — M545 Low back pain: Secondary | ICD-10-CM

## 2016-10-09 NOTE — Telephone Encounter (Signed)
Spoke with patient regarding appointment at 1:15 this afternoon.  He reports in talking with front office last week, he thought he cancelled all remaining PT appointments, as his MRI is not scheduled until next week.  PT advised pt to please contact our office with MRI results/clearance to return to PT so we can know how to proceed with POC.  Pt verbalizes understanding.

## 2016-10-11 ENCOUNTER — Ambulatory Visit: Payer: Self-pay | Admitting: Physical Therapy

## 2016-10-20 ENCOUNTER — Ambulatory Visit
Admission: RE | Admit: 2016-10-20 | Discharge: 2016-10-20 | Disposition: A | Discharge status: Home / Self Care | Payer: Medicare Other | Source: Ambulatory Visit | Attending: Family Medicine | Admitting: Family Medicine

## 2016-10-20 DIAGNOSIS — M545 Low back pain: Secondary | ICD-10-CM

## 2016-10-20 DIAGNOSIS — S39012A Strain of muscle, fascia and tendon of lower back, initial encounter: Secondary | ICD-10-CM

## 2016-11-22 ENCOUNTER — Encounter: Payer: Self-pay | Admitting: Physical Therapy

## 2016-11-22 DIAGNOSIS — M6281 Muscle weakness (generalized): Secondary | ICD-10-CM | POA: Insufficient documentation

## 2016-11-22 DIAGNOSIS — R2689 Other abnormalities of gait and mobility: Secondary | ICD-10-CM | POA: Insufficient documentation

## 2016-11-22 DIAGNOSIS — R2681 Unsteadiness on feet: Secondary | ICD-10-CM | POA: Insufficient documentation

## 2016-11-22 NOTE — Therapy (Signed)
[facility] [address] [place], [place], [address] Phone: [phone]   Fax:  [phone]  Patient Details  Name: Andrew Larsen MRN: [id number] Date of Birth: 07/28/1940 Referring Provider:  No ref. provider found  Encounter Date: 11/30/2016       G-Codes - November 30, 2016 0825    Functional Assessment Tool Used (Outpatient Only) TUG 15.34 sec, DGI 15/24 (did not return to PT after 09/25/16 visit)   Functional Limitation Mobility: Walking and moving around   Mobility: Walking and Moving Around Goal Status [phone]) At least 20 percent but less than 40 percent impaired, limited or restricted   Mobility: Walking and Moving Around Discharge Status [phone]) At least 20 percent but less than 40 percent impaired, limited or restricted      PHYSICAL THERAPY DISCHARGE SUMMARY  Visits from Start of Care: 11  Current functional level related to goals / functional outcomes: Pt has met all STGs, but LTGs not able to be assessed, as pt did not return after 09/25/16 visit.   Remaining deficits: Balance, weakness, gait instability.  Pt had fall leaving therapy session on 09/25/16.  Pt cancelled remaining appointments until MRI, and was advised to call back to schedule when able.  Pt did not call or return to therapy.   Education / Equipment: HEP, benefits of orthotics/bracing to add stability for knee  Plan: Patient agrees to discharge.  Patient goals were not met. Patient is being discharged due to not returning since the last visit.  ?????      Amaziah Ghosh W. 11/30/2016, 8:26 AM Frazier Butt., PT [facility] [address] [place], [place], [address] Phone: [phone]   Fax:  [phone]

## 2017-12-11 ENCOUNTER — Ambulatory Visit: Payer: Medicare Other | Admitting: Neurology

## 2017-12-11 ENCOUNTER — Encounter: Payer: Self-pay | Admitting: Neurology

## 2017-12-11 VITALS — BP 161/76 | HR 86 | Ht 70.0 in | Wt 271.0 lb

## 2017-12-11 DIAGNOSIS — G4719 Other hypersomnia: Secondary | ICD-10-CM

## 2017-12-11 DIAGNOSIS — E669 Obesity, unspecified: Secondary | ICD-10-CM

## 2017-12-11 DIAGNOSIS — R296 Repeated falls: Secondary | ICD-10-CM | POA: Diagnosis not present

## 2017-12-11 DIAGNOSIS — R0681 Apnea, not elsewhere classified: Secondary | ICD-10-CM

## 2017-12-11 DIAGNOSIS — R29898 Other symptoms and signs involving the musculoskeletal system: Secondary | ICD-10-CM | POA: Diagnosis not present

## 2017-12-11 DIAGNOSIS — R519 Headache, unspecified: Secondary | ICD-10-CM

## 2017-12-11 DIAGNOSIS — R0683 Snoring: Secondary | ICD-10-CM

## 2017-12-11 DIAGNOSIS — M7989 Other specified soft tissue disorders: Secondary | ICD-10-CM

## 2017-12-11 DIAGNOSIS — E0842 Diabetes mellitus due to underlying condition with diabetic polyneuropathy: Secondary | ICD-10-CM | POA: Diagnosis not present

## 2017-12-11 DIAGNOSIS — R351 Nocturia: Secondary | ICD-10-CM

## 2017-12-11 DIAGNOSIS — I739 Peripheral vascular disease, unspecified: Secondary | ICD-10-CM

## 2017-12-11 DIAGNOSIS — R42 Dizziness and giddiness: Secondary | ICD-10-CM

## 2017-12-11 DIAGNOSIS — R51 Headache: Secondary | ICD-10-CM

## 2017-12-11 NOTE — Patient Instructions (Addendum)
Your falls and dizzy spells may be due to a number of factors:   Diabetic neuropathy, swelling of both legs, obesity, prior ankle fractures with decrease range of motion, deconditioning, side effects from taking her cholesterol medication potentially. You do have a mild drop in blood pressure numbers when going from lying to standing.  I would like to suggest a couple of things today.  #1: we will do blood work to look for muscle breakdown. We are checking muscle enzymes. #2: I will order an EMG and nerve conduction velocity test, which is an electrical nerve and muscle test, which we will schedule. We will call you with the results. #3: Based on your symptoms and your exam I believe you are at risk for obstructive sleep apnea (aka OSA), and I think we should proceed with a sleep study to determine whether you do or do not have OSA and how severe it is. Even, if you have mild OSA, I may want you to consider treatment with CPAP, as treatment of even borderline or mild sleep apnea can result and improvement of symptoms such as sleep disruption, daytime sleepiness, nighttime bathroom breaks, restless leg symptoms, improvement of headache syndromes, even improved mood disorder.   Please remember, the long-term risks and ramifications of untreated moderate to severe obstructive sleep apnea are: increased Cardiovascular disease, including congestive heart failure, stroke, difficult to control hypertension, treatment resistant obesity, arrhythmias, especially irregular heartbeat commonly known as A. Fib. (atrial fibrillation); even type 2 diabetes has been linked to untreated OSA.   Sleep apnea can cause disruption of sleep and sleep deprivation in most cases, which, in turn, can cause recurrent headaches, problems with memory, mood, concentration, focus, and vigilance. Most people with untreated sleep apnea report excessive daytime sleepiness, which can affect their ability to drive. Please do not drive if you  feel sleepy. Patients with sleep apnea developed difficulty initiating and maintaining sleep (aka insomnia).   Having sleep apnea may increase your risk for other sleep disorders, including involuntary behaviors sleep such as sleep terrors, sleep talking, sleepwalking.    Having sleep apnea can also increase your risk for restless leg syndrome and leg movements at night.   Please note that untreated obstructive sleep apnea may carry additional perioperative morbidity. Patients with significant obstructive sleep apnea (typically, in the moderate to severe degree) should receive, if possible, perioperative PAP (positive airway pressure) therapy and the surgeons and particularly the anesthesiologists should be informed of the diagnosis and the severity of the sleep disordered breathing.   I will likely see you back after your sleep study to go over the test results and where to go from there. We will call you after your sleep study to advise about the results (most likely, you will hear from [place], my nurse) and to set up an appointment at the time, as necessary.    Our sleep lab administrative assistant will call you to schedule your sleep study and give you further instructions, regarding the check in process for the sleep study, arrival time, what to bring, when you can expect to leave after the study, etc., and to answer any other logistical questions you may have. If you don't hear back from her by about 2 weeks from now, please feel free to call her direct line at [phone] or you can call our general clinic number, or email [place] through My Chart.

## 2017-12-11 NOTE — Progress Notes (Signed)
Subjective:    Patient ID: Andrew Larsen is a 77 y.o. male.  HPI     Star Age, MD, PhD [facility] [address] [place], [place] [address]  Dear Dr. Chalmers Cater,   I saw your patient, Andrew Larsen, upon your kind request in my neurologic clinic today for initial consultation of his dizziness. The patient is accompanied by his daughter today. As you know, Andrew Larsen is a 53 -year-old right-handed gentleman with an underlying complex medical history of peripheral vascular disease, hypertension, hyperlipidemia, glaucoma, reflux disease, bilateral ankle fractures with status post surgeries, arthroscopic right knee surgery and status post left femoral stent placement in February 2018, diabetes, chronic cough, prior smoking, and obesity, who reports intermittent dizzy spells but more often he feels weaker in his legs particularly in his thigh muscles. He has fallen. He denies a spinning sensation. He reports pain in both groin areas. Of note, he has arthritis problems and ongoing issues with his right knee as well. He was told that he is hyperextending his right knee. He had arthroscopic surgery to the knee and had surgeries to both ankles after fractures. He has gained weight. I reviewed your office note from 11/06/2017, which you kindly included. His A1c from December 2018 was 7.0. Most recent A1c was 6 per your office note. He had a sleep study many years ago but is not sure what it showed. In 2017 he was referred to me for sleep evaluation but did not decide to go through with the consultation. He would be willing to get tested for sleep apnea. He does not sleep well. He snores and has witnessed apneas. He is widowed and lives with his only daughter. He quit smoking in the 80s and does not utilize alcohol on a regular basis, drinks caffeine in the form of coffee, 3-4 times per week, not daily. His brother may have sleep apnea. He has occasional morning  headaches and reports nocturia about 3 times per average night. He has significant lower extremity swelling. He takes Lasix as needed for this. He reports tingling and burning sensation in both feet, particularly on the bottom of both feet, he carries a diagnosis of diabetic neuropathy.   His Past Medical History Is Significant For: Past Medical History:  Diagnosis Date  . Daytime somnolence   . Diabetes mellitus without complication ([place])   . Fracture    right fibula/wears boot cast  . GERD (gastroesophageal reflux disease)   . Glaucoma   . Hypercholesteremia   . Hyperlipidemia   . Hypertension   . Mold exposure     His Past Surgical History Is Significant For: Past Surgical History:  Procedure Laterality Date  . LOWER EXTREMITY ANGIOGRAPHY N/A 06/12/2016   Procedure: Lower Extremity Angiography;  Surgeon: Adrian Prows, MD;  Location: [facility];  Service: Cardiovascular;  Laterality: N/A;  . ORIF ANKLE FRACTURE Right 10/02/2012   Procedure: OPEN REDUCTION INTERNAL FIXATION (ORIF) ANKLE FRACTURE;  Surgeon: Meredith Pel, MD;  Location: [facility];  Service: Orthopedics;  Laterality: Right;  . PERIPHERAL VASCULAR ATHERECTOMY Left 06/12/2016   Procedure: Peripheral Vascular Atherectomy-Left Popliteal;  Surgeon: Adrian Prows, MD;  Location: [facility];  Service: Cardiovascular;  Laterality: Left;  . PERIPHERAL VASCULAR INTERVENTION Left 06/12/2016   Procedure: Peripheral Vascular Intervention- DCB Left Popliteal;  Surgeon: Adrian Prows, MD;  Location: [facility];  Service: Cardiovascular;  Laterality: Left;    His Family History Is Significant  For: Family History  Problem Relation Age of Onset  . Heart disease Father   . Prostate cancer Brother   . Cancer Brother   . Diabetes Brother   . Colon cancer Neg Hx     His Social History Is Significant For: Social History   Socioeconomic History  . Marital status: Married    Spouse name: Not on file  . Number of  children: Not on file  . Years of education: Not on file  . Highest education level: Not on file  Occupational History  . Not on file  Social Needs  . Financial resource strain: Not on file  . Food insecurity:    Worry: Not on file    Inability: Not on file  . Transportation needs:    Medical: Not on file    Non-medical: Not on file  Tobacco Use  . Smoking status: Former Smoker    Packs/day: 1.00    Years: 20.00    Pack years: 20.00    Last attempt to quit: 08/22/1981    Years since quitting: 36.3  . Smokeless tobacco: Never Used  Substance and Sexual Activity  . Alcohol use: No    Alcohol/week: 0.0 standard drinks  . Drug use: No  . Sexual activity: Never  Lifestyle  . Physical activity:    Days per week: Not on file    Minutes per session: Not on file  . Stress: Not on file  Relationships  . Social connections:    Talks on phone: Not on file    Gets together: Not on file    Attends religious service: Not on file    Active member of club or organization: Not on file    Attends meetings of clubs or organizations: Not on file    Relationship status: Not on file  Other Topics Concern  . Not on file  Social History Narrative   ** Merged History Encounter **        His Allergies Are:  Allergies  Allergen Reactions  . Metformin And Related   . Simbrinza [Brinzolamide-Brimonidine]     Drainage, redness to eyes  :   His Current Medications Are:  Outpatient Encounter Medications as of 12/11/2017  Medication Sig  . albuterol (PROVENTIL HFA;VENTOLIN HFA) 108 (90 Base) MCG/ACT inhaler Inhale 2 puffs into the lungs every 4 (four) hours as needed for wheezing or shortness of breath.  Marland Kitchen aspirin 81 MG chewable tablet Chew 81 mg by mouth at bedtime.   . brimonidine-timolol (COMBIGAN) 0.2-0.5 % ophthalmic solution Place 1 drop into both eyes every 12 (twelve) hours.  . Calcium Carbonate (CALCIUM 600 PO) Take 2 capsules by mouth daily.   . Cholecalciferol (VITAMIN D3) 5000  units TABS Take 10,000 Units by mouth daily.   . cilostazol (PLETAL) 100 MG tablet Take 100 mg by mouth 2 (two) times daily.  . famotidine (PEPCID) 20 MG tablet Take 20 mg by mouth at bedtime.  . furosemide (LASIX) 40 MG tablet Take 40 mg by mouth daily.  Marland Kitchen gabapentin (NEURONTIN) 300 MG capsule Take 600 mg by mouth 3 (three) times daily.   . insulin NPH-regular Human (NOVOLIN 70/30) (70-30) 100 UNIT/ML injection Inject into the skin 2 (two) times daily with a meal. 70 units in am and 50 units in the evening  . JANUVIA 100 MG tablet Take 100 mg by mouth daily.  Marland Kitchen latanoprost (XALATAN) 0.005 % ophthalmic solution Place 1 drop into both eyes at bedtime.  . magnesium gluconate (  MAGONATE) 500 MG tablet Take 500 mg by mouth daily.  . Multiple Vitamin (MULTIVITAMIN) capsule Take 1 capsule by mouth daily.   Marland Kitchen omeprazole (PRILOSEC) 40 MG capsule Take 40 mg by mouth 2 (two) times daily.   Marland Kitchen oxybutynin (DITROPAN) 5 MG tablet Take 5 mg by mouth 2 (two) times daily.  . potassium chloride SA (K-DUR,KLOR-CON) 20 MEQ tablet Take 20 mEq by mouth daily.  . pravastatin (PRAVACHOL) 40 MG tablet Take 40 mg by mouth at bedtime.   . tamsulosin (FLOMAX) 0.4 MG CAPS capsule Take 0.4 mg by mouth daily.  Marland Kitchen triamterene-hydrochlorothiazide (DYAZIDE) 37.5-25 MG capsule Take 1 capsule by mouth daily.  . [DISCONTINUED] cetirizine (ZYRTEC) 10 MG tablet Take 10 mg by mouth daily as needed (DRAINAGE).  . [DISCONTINUED] docusate sodium (COLACE) 100 MG capsule Take 100 mg by mouth daily.  . [DISCONTINUED] losartan (COZAAR) 50 MG tablet Take 1 tablet (50 mg total) by mouth every morning.  . [DISCONTINUED] magnesium 30 MG tablet Take 30 mg by mouth daily.  . [DISCONTINUED] methocarbamol (ROBAXIN) 500 MG tablet Take 500 mg by mouth every 8 (eight) hours as needed for muscle spasms.   No facility-administered encounter medications on file as of 12/11/2017.   :  Review of Systems:  Out of a complete 14 point review of systems, all  are reviewed and negative with the exception of these symptoms as listed below: Review of Systems  Neurological:       Pt presents today to discuss his falls. Pt reports leg weakness. Pt has had a sleep study in the past but never treated for sleep apnea. Pt does endorse snoring.    Objective:  Neurological Exam  Physical Exam Physical Examination:   Vitals:   12/11/17 1033  BP: (!) 161/76  Pulse: 86   On orthostatic vital signs testing, he has a nearly 20 point drop between lying systolic blood pressure and standing systolic blood pressure. He denies any vertiginous symptoms.  General Examination: The patient is a very pleasant 77 y.o. male in no acute distress. He appears well-developed and well-nourished and well groomed.   HEENT: Normocephalic, atraumatic, pupils are equal, round and reactive to light and accommodation. Extraocular tracking is good without limitation to gaze excursion or nystagmus noted. Normal smooth pursuit is noted. Hearing is grossly intact. Face is symmetric with normal facial animation and normal facial sensation. Speech is clear with no dysarthria noted. There is no hypophonia. There is no lip, neck/head, jaw or voice tremor. Neck is supple with full range of passive and active motion. There are no carotid bruits on auscultation. Oropharynx exam reveals: mild mouth dryness, adequate dental hygiene and marked airway crowding, due to thicker soft palate, larger tongue, larger uvula, tonsils are 1-2+ bilaterally. Mallampati is class III. Neck circumference is 19-3/4 inches.  Chest: Clear to auscultation without wheezing, rhonchi or crackles noted.  Heart: S1+S2+0, regular and normal without murmurs, rubs or gallops noted.   Abdomen: Soft, non-tender and non-distended with normal bowel sounds appreciated on auscultation.  Extremities: There is 2+ pitting edema in the distal lower extremities bilaterally.   Skin: Warm and dry without trophic changes  noted.  Musculoskeletal: exam reveals limited mobility in the left ankle. Hardware in place in the right ankle. Bilateral ankle swelling as well, he has groin pain bilaterally. He feels achy in both thighs.  Neurologically:  Mental status: The patient is awake, alert and oriented in all 4 spheres. His immediate and remote memory, attention, language skills and  fund of knowledge are appropriate. There is no evidence of aphasia, agnosia, apraxia or anomia. Speech is clear with normal prosody and enunciation. Thought process is linear. Mood is normal and affect is normal.  Cranial nerves II - XII are as described above under HEENT exam. In addition: shoulder shrug is normal with equal shoulder height noted. Motor exam: Normal bulk, strength and tone is noted with the exception of decreased muscle bulk in the thigh muscles. He has mild hip flexor weakness bilaterally but also some tenderness reported. Some pain related giveaway weakness noted. There is no drift, tremor or rebound. Romberg is Not possible for him as he does not feel comfortable standing narrow based. Reflexes are 1+ in the upper extremities, trace in both knees and absent in the ankles. Fine motor skills are globally mildly impaired particularly in both feet. Sensory exam shows fairly preserved sensation to light touch, pinprick, temperature and vibration in the distal upper and lower extremities. Cerebellar testing: No dysmetria or intention tremor on finger to nose testing. Heel to shin is not possible. Gait, station and balance: He stands with difficulty And has to push himself up. On standing, he has a tendency to hyperextend his right knee. His toes pointed outwards. He stands slightly wide-based. Tandem walk is not possible for him. He has slow and difficult walking, uses a single-point cane on the left.  Assessment and Plan:    In summary, Andrew Larsen is a very pleasant 77 y.o.-year old male with an underlying complex medical  history of peripheral vascular disease, hypertension, hyperlipidemia, glaucoma, reflux disease, bilateral ankle fractures with status post surgeries, arthroscopic right knee surgery and status post left femoral stent placement in February 2018, diabetes, chronic cough, prior smoking, and obesity, who presents for evaluation of his dizzy spells, he reports feeling heavier and weaker in his legs and he has had falls.on examination, he does have mild hip flexor weakness bilaterally. He also has a history concerning for diabetic neuropathy. I think his symptoms and findings cannot be explained by one unifying diagnosis but there may be a number of factors at play including diabetic neuropathy, lower extremity swelling, obesity, prior ankle fractures with decreased range of motion especially in the left ankle noted, arthritis affecting his right knee, deconditioning, and potential side effects from taking a statin. In addition, he had mild orthostatic blood pressure drop in the systolic blood pressure values. He also has a history and exam concerning for underlying obstructive sleep apnea. He was supposed to have a sleep study done some years ago. He did not pursue it at the time but would be willing at this point. I suggested that we proceed with blood testing for muscle enzymes today. We will also schedule him for EMG and nerve conduction testing of his lower extremities. We will also received with a sleep study. I did not suggest any new medication from my end of things at this point. We will review things after his testing is completed and take it from there. He is advised to consider seeing his orthopedic surgeon again for his right knee pain and hyperextending his right knee. He used to have a brace but he may be able to use a more softer or elastic brace for support. He is reminded to stay well-hydrated and change positions slowly. He is willing to consider CPAP therapy for OSA if the need arises. I will see him  back after testing is completed and we will keep him or his daughter posted  as to his test results in the interim. I answered all their questions today and the patient and his daughter were in agreement.  Most of my 60 minute visit today was spent in counseling and coordination of care, reviewing test results and reviewing medication.  Thank you very much for allowing me to participate in the care of this nice patient. If I can be of any further assistance to you please do not hesitate to call me at [phone].  Sincerely,   Star Age, MD, PhD

## 2017-12-12 ENCOUNTER — Telehealth: Payer: Self-pay

## 2017-12-12 LAB — CK: CK TOTAL: 750 U/L — AB (ref 24–204)

## 2017-12-12 NOTE — Progress Notes (Signed)
Please call pt or rather: daughter, re: CK level being rather high, meaning some muscle breakdown is going on or has taken place. He will also need to hydrate well as the protein of the muscle breakdown can put a strain on the kidneys. We may have to do additional testing soon. I would like for him to talk to PCP about coming off his cholesterol medication and either trying a non-statin type drug or just staying off for now if at all possible. Please send lab to PCP as well. He should make appt with PCP ASAP.  Michel Bickers

## 2017-12-12 NOTE — Telephone Encounter (Signed)
I called pt and explained his lab results. He will call Dr. Hoover Brunette office and make an appt to discuss this CK level and perhaps coming off of his statin medication. Pt will stay well hydrated. I will ask our NCV/EMG schedulers if we can move pt's NCV/EMG appt up sooner, per Dr. Guadelupe Sabin request, and we will call pt to discuss this sooner appt. Pt verbalized understanding of results. Pt had no questions at this time but was encouraged to call back if questions arise. I have sent a copy of pt's labs to Dr. Ardeth Perfect.

## 2017-12-12 NOTE — Telephone Encounter (Signed)
-----   Message from Star Age, MD sent at 12/12/2017  7:42 AM EDT ----- Please call pt or rather: daughter, re: CK level being rather high, meaning some muscle breakdown is going on or has taken place. He will also need to hydrate well as the protein of the muscle breakdown can put a strain on the kidneys. We may have to do additional testing soon. I would like for him to talk to PCP about coming off his cholesterol medication and either trying a non-statin type drug or just staying off for now if at all possible. Please send lab to PCP as well. He should make appt with PCP ASAP.  Andrew Larsen

## 2017-12-16 ENCOUNTER — Ambulatory Visit (INDEPENDENT_AMBULATORY_CARE_PROVIDER_SITE_OTHER): Payer: Self-pay | Admitting: Neurology

## 2017-12-16 ENCOUNTER — Encounter: Payer: Self-pay | Admitting: Neurology

## 2017-12-16 ENCOUNTER — Ambulatory Visit: Payer: Medicare Other | Admitting: Neurology

## 2017-12-16 DIAGNOSIS — R42 Dizziness and giddiness: Secondary | ICD-10-CM

## 2017-12-16 DIAGNOSIS — R296 Repeated falls: Secondary | ICD-10-CM

## 2017-12-16 DIAGNOSIS — Z0289 Encounter for other administrative examinations: Secondary | ICD-10-CM

## 2017-12-16 DIAGNOSIS — I739 Peripheral vascular disease, unspecified: Secondary | ICD-10-CM

## 2017-12-16 DIAGNOSIS — R0681 Apnea, not elsewhere classified: Secondary | ICD-10-CM

## 2017-12-16 DIAGNOSIS — R29898 Other symptoms and signs involving the musculoskeletal system: Secondary | ICD-10-CM | POA: Diagnosis not present

## 2017-12-16 DIAGNOSIS — R351 Nocturia: Secondary | ICD-10-CM

## 2017-12-16 DIAGNOSIS — G4719 Other hypersomnia: Secondary | ICD-10-CM

## 2017-12-16 DIAGNOSIS — R0683 Snoring: Secondary | ICD-10-CM

## 2017-12-16 DIAGNOSIS — R519 Headache, unspecified: Secondary | ICD-10-CM

## 2017-12-16 DIAGNOSIS — E0842 Diabetes mellitus due to underlying condition with diabetic polyneuropathy: Secondary | ICD-10-CM

## 2017-12-16 DIAGNOSIS — M7989 Other specified soft tissue disorders: Secondary | ICD-10-CM

## 2017-12-16 DIAGNOSIS — R51 Headache: Secondary | ICD-10-CM

## 2017-12-16 DIAGNOSIS — E669 Obesity, unspecified: Secondary | ICD-10-CM

## 2017-12-16 NOTE — Progress Notes (Signed)
Please refer to EMG and nerve conduction study procedure note. 

## 2017-12-16 NOTE — Procedures (Signed)
HISTORY:  Andrew Larsen is a 77 year old gentleman with a history of diabetes and dyslipidemia on pravastatin.  The patient has had a greater than 1 year history of progressive leg weakness, right greater than left.  The patient has fallen on occasion, he has a modest elevation in his CK enzyme levels.  He does have pain in the right hip and groin area, worse with standing.  He is being evaluated for possible neuropathy or a myopathic disorder.  NERVE CONDUCTION STUDIES:  Nerve conduction studies were performed on both lower extremities.  The distal motor latencies for the peroneal and posterior tibial nerves were normal bilaterally with low motor amplitudes for the left peroneal nerve and for the left posterior tibial nerve, normal for the right peroneal and posterior tibial nerve.  Slowing was seen for the left peroneal nerve above and below the fibular head, otherwise nerve conduction studies for the right peroneal nerve and for the posterior tibial nerves were normal bilaterally.  The sensory latency for the right peroneal nerve was unobtainable, with a prolonged peroneal sensory latency on the left.  The sural sensory latencies were prolonged bilaterally.  The posterior tibial F wave latencies were within normal limits bilaterally.  EMG STUDIES:  EMG study was performed on the right lower extremity:  The tibialis anterior muscle reveals 1 to 2K motor units with decreased recruitment.  1+ positive waves were seen.  Polyphasic units were seen. The peroneus tertius muscle reveals 1 to 2K motor units with decreased recruitment.  1+ positive waves were seen.  Polyphasic units were seen. The medial gastrocnemius muscle reveals 1 to 2K motor units with slightly decreased recruitment.  1+ positive waves were seen. The vastus lateralis muscle reveals 1 to 3K motor units with slightly decreased recruitment.  1+ positive waves were seen.  Polyphasic units were seen. The iliopsoas muscle reveals 1  to 3K motor units with slightly decreased recruitment.  1+ positive waves were seen.  Polyphasic units were seen. The biceps femoris muscle (long head) reveals 2 to 4K motor units with full recruitment. No fibrillations or positive waves were seen. The lumbosacral paraspinal muscles were tested at 3 levels, and revealed no abnormalities of insertional activity at all 3 levels tested. There was good relaxation.  EMG study was performed on the left lower extremity:  The tibialis anterior muscle reveals 1 to 3K motor units with decreased recruitment.  1+ positive waves were seen. The peroneus tertius muscle reveals 1 to 3K motor units with decreased recruitment. No fibrillations or positive waves were seen. The medial gastrocnemius muscle reveals 1 to 2K motor units with decreased recruitment.  1+ positive waves were seen. The vastus lateralis muscle reveals 1 to 3K motor units with slightly decreased recruitment.  1+ positive waves were seen.  Polyphasic units were seen. The iliopsoas muscle reveals 1 to 2K motor units with full recruitment.  1+ positive waves were seen.  Small polyphasic units were seen. The biceps femoris muscle (long head) reveals 2 to 4K motor units with full recruitment. No fibrillations or positive waves were seen. The lumbosacral paraspinal muscles were tested at 3 levels, and revealed no abnormalities of insertional activity at all 3 levels tested. There was good relaxation.   IMPRESSION:  Nerve conduction studies done on both lower extremities shows evidence of a primarily axonal peripheral neuropathy of mild to moderate severity.  EMG evaluations on the lower extremities show evidence of mild proximal myopathic changes, distally there appears to be acute denervation  with lower motor unit amplitudes but decreased recruitment.  This would suggest some degree of neuropathic denervation distally as well.  If a muscle biopsy is recommended, would suggest the left vastus lateralis  muscle.  Jill Alexanders MD 12/16/2017 1:59 PM  [facility] [address] [place], [place] [address]  Phone [phone] Fax [phone]

## 2017-12-16 NOTE — Progress Notes (Signed)
[facility]    Nerve / Sites Muscle Latency Ref. Amplitude Ref. Rel Amp Segments Distance Velocity Ref. Area    ms ms mV mV %  cm m/s m/s mVms  R Peroneal - EDB     Ankle EDB 5.4 ?6.5 3.1 ?2.0 100 Ankle - EDB 9   9.8     Fib head EDB 13.1  2.2  72.3 Fib head - Ankle 34 44 ?44 9.4     Pop fossa EDB 15.4  2.0  87.9 Pop fossa - Fib head 10 45 ?44 8.5         Pop fossa - Ankle      L Peroneal - EDB     Ankle EDB 5.5 ?6.5 1.6 ?2.0 100 Ankle - EDB 9   4.6     Fib head EDB 14.1  1.2  72.1 Fib head - Ankle 34 40 ?44 3.9     Pop fossa EDB 16.5  1.9  167 Pop fossa - Fib head 10 41 ?44 6.0         Pop fossa - Ankle      R Tibial - AH     Ankle AH 4.8 ?5.8 4.1 ?4.0 100 Ankle - AH 9   11.4     Pop fossa AH 15.3  3.2  79 Pop fossa - Ankle 43 41 ?41 9.9  L Tibial - AH     Ankle AH 5.4 ?5.8 2.2 ?4.0 100 Ankle - AH 9   4.1     Pop fossa AH 15.8  2.0  93.4 Pop fossa - Ankle 43 41 ?41 4.7             SNC    Nerve / Sites Rec. Site Peak Lat Ref.  Amp Ref. Segments Distance    ms ms V V  cm  R Sural - Ankle (Calf)     Calf Ankle 6.8 ?4.4 2 ?6 Calf - Ankle 14  L Sural - Ankle (Calf)     Calf Ankle 6.6 ?4.4 2 ?6 Calf - Ankle 14  L Superficial peroneal - Ankle     Lat leg Ankle 5.5 ?4.4 2 ?6 Lat leg - Ankle 14  R Superficial peroneal - Ankle     Lat leg Ankle NR ?4.4 NR ?6 Lat leg - Ankle 14              F  Wave    Nerve F Lat Ref.   ms ms  R Tibial - AH 53.2 ?56.0  L Tibial - AH 55.5 ?56.0

## 2017-12-17 ENCOUNTER — Telehealth: Payer: Self-pay

## 2017-12-17 DIAGNOSIS — E0842 Diabetes mellitus due to underlying condition with diabetic polyneuropathy: Secondary | ICD-10-CM

## 2017-12-17 DIAGNOSIS — R29898 Other symptoms and signs involving the musculoskeletal system: Secondary | ICD-10-CM

## 2017-12-17 NOTE — Telephone Encounter (Signed)
--  Referral sent to [facility] Neuro[facility] Telephone   [phone]- fax [phone]

## 2017-12-17 NOTE — Progress Notes (Signed)
Recent nerve and muscle test shows some findings in keep with diabetic neuropathy, ie nerve damage, which we expected. In addition there are changes in the thigh muscles, called myopathy. Would recommend, coming off the cholesterol med, as discussed. Next step, if he is agreeable: I would recommend getting a muscle biopsy, which we request through [facility] (usually a minor, office based procedure). This may help narrow down what type of muscle breakdown we are dealing with; possibilities are: statin related, vs. Diabetes, vs other cause such as inflammatory. If okay, we will req. NSG consultation/referral for myopathy and req. For Bx of Left vastus lateralis muscle.  Please update pt or daughter. Will arrange for FU after PSG, which is on 12/22/17.

## 2017-12-17 NOTE — Telephone Encounter (Signed)
I called pt. I advised him of his EMG/NCV results. Pt is agreeable to a NSG referral for the muscle biopsy. Pt will also discuss coming off of his statin with Dr. Ardeth Perfect. I will call pt when I have his sleep study results available to discuss a follow up. Pt verbalized understanding of results. Pt had no questions at this time but was encouraged to call back if questions arise.

## 2017-12-17 NOTE — Telephone Encounter (Signed)
-----   Message from Star Age, MD sent at 12/17/2017  8:55 AM EDT ----- Recent nerve and muscle test shows some findings in keep with diabetic neuropathy, ie nerve damage, which we expected. In addition there are changes in the thigh muscles, called myopathy. Would recommend, coming off the cholesterol med, as discussed. Next step, if he is agreeable: I would recommend getting a muscle biopsy, which we request through [facility] (usually a minor, office based procedure). This may help narrow down what type of muscle breakdown we are dealing with; possibilities are: statin related, vs. Diabetes, vs other cause such as inflammatory. If okay, we will req. NSG consultation/referral for myopathy and req. For Bx of Left vastus lateralis muscle.  Please update pt or daughter. Will arrange for FU after PSG, which is on 12/22/17.

## 2017-12-22 ENCOUNTER — Ambulatory Visit (INDEPENDENT_AMBULATORY_CARE_PROVIDER_SITE_OTHER): Payer: Medicare Other | Admitting: Neurology

## 2017-12-22 DIAGNOSIS — R29898 Other symptoms and signs involving the musculoskeletal system: Secondary | ICD-10-CM

## 2017-12-22 DIAGNOSIS — R0681 Apnea, not elsewhere classified: Secondary | ICD-10-CM

## 2017-12-22 DIAGNOSIS — R296 Repeated falls: Secondary | ICD-10-CM

## 2017-12-22 DIAGNOSIS — R51 Headache: Secondary | ICD-10-CM

## 2017-12-22 DIAGNOSIS — R519 Headache, unspecified: Secondary | ICD-10-CM

## 2017-12-22 DIAGNOSIS — I739 Peripheral vascular disease, unspecified: Secondary | ICD-10-CM

## 2017-12-22 DIAGNOSIS — E669 Obesity, unspecified: Secondary | ICD-10-CM

## 2017-12-22 DIAGNOSIS — G4720 Circadian rhythm sleep disorder, unspecified type: Secondary | ICD-10-CM

## 2017-12-22 DIAGNOSIS — G4733 Obstructive sleep apnea (adult) (pediatric): Secondary | ICD-10-CM

## 2017-12-22 DIAGNOSIS — R0683 Snoring: Secondary | ICD-10-CM

## 2017-12-22 DIAGNOSIS — R42 Dizziness and giddiness: Secondary | ICD-10-CM

## 2017-12-22 DIAGNOSIS — R351 Nocturia: Secondary | ICD-10-CM

## 2017-12-22 DIAGNOSIS — E0842 Diabetes mellitus due to underlying condition with diabetic polyneuropathy: Secondary | ICD-10-CM

## 2017-12-22 DIAGNOSIS — M7989 Other specified soft tissue disorders: Secondary | ICD-10-CM

## 2017-12-22 DIAGNOSIS — G4719 Other hypersomnia: Secondary | ICD-10-CM

## 2017-12-24 ENCOUNTER — Telehealth: Payer: Self-pay

## 2017-12-24 NOTE — Telephone Encounter (Signed)
I called pt. I advised pt that Dr. Rexene Alberts reviewed their sleep study results and found that pt has severe osa but did well during his latest sleep study with the cpap. Dr. Rexene Alberts recommends that pt start a cpap at home. I reviewed PAP compliance expectations with the pt. Pt is agreeable to starting a CPAP. I advised pt that an order will be sent to a DME, [facility], and [facility] will call the pt within about one week after they file with the pt's insurance. [facility] will show the pt how to use the machine, fit for masks, and troubleshoot the CPAP if needed. A follow up appt was made for insurance purposes with Dr. Rexene Alberts on 03/18/18 at 2:00pm. Pt verbalized understanding to arrive 15 minutes early and bring their CPAP. A letter with all of this information in it will be mailed to the pt as a reminder. I verified with the pt that the address we have on file is correct. Pt verbalized understanding of results. Pt had no questions at this time but was encouraged to call back if questions arise.

## 2017-12-24 NOTE — Procedures (Signed)
PATIENT'S NAME:  Andrew Larsen, Andrew Larsen DOB:      Sep 02, 1940      MR#:    [id number]     DATE OF RECORDING: 12/22/2017 REFERRING M.D.: Dr. Cyd Silence, PCP: Velna Hatchet, MD Study Performed:  Split-Night Titration Study HISTORY: 77 -year-old man with a complex medical history of peripheral vascular disease, hypertension, hyperlipidemia, glaucoma, reflux disease, bilateral ankle fractures with status post surgeries, arthroscopic right knee surgery and status post left femoral stent placement in February 2018, diabetes, chronic cough, prior smoking, and obesity, who reports intermittent dizzy spells and poor quality sleep, witnessed apneas per wife. The patient's weight 271 pounds with a height of 70 (inches), resulting in a BMI of 38.8 kg/m2. The patient's neck circumference measured 19.8 inches.  CURRENT MEDICATIONS: Proventil, Ventolin HFA, Combigan, Calcium, Vitamin D3, Pletal, Pepcid, Lasix, Neurontin, Novolin, Januvia, Xalatan, Magonate, Multivitamin, Prilosec, Ditropan, K-Dur,Klor-con, Pravachol, Flomax, Dyazide, Zyrtec,   PROCEDURE:  This is a multichannel digital polysomnogram utilizing the Somnostar 11.2 system.  Electrodes and sensors were applied and monitored per AASM Specifications.   EEG, EOG, Chin and Limb EMG, were sampled at 200 Hz.  ECG, Snore and Nasal Pressure, Thermal Airflow, Respiratory Effort, CPAP Flow and Pressure, Oximetry was sampled at 50 Hz. Digital video and audio were recorded.      BASELINE STUDY WITHOUT CPAP RESULTS:  Lights Out was at 21:26 and Lights On at 05:13 for the night, split start at 00:36, Epoch 407.  Total recording time (TRT) was 231.5, with a total sleep time (TST) of 193.5 minutes.   The patient's sleep latency was 3.5 minutes.  REM latency was 93 minutes.  The sleep efficiency was 83.6 %.    SLEEP ARCHITECTURE: WASO (Wake after sleep onset) was 12.5 minutes, Stage N1 was 4.5 minutes, Stage N2 was 164 minutes, Stage N3 was 0 minutes and Stage R (REM sleep) was 25  minutes.  The percentages were Stage N1 2.3%, Stage N2 84.8%, Stage N3 was absent, and Stage R (REM sleep) 12.9%.  The arousals were noted as: 45 were spontaneous, 0 were associated with PLMs, 13 were associated with respiratory events.   RESPIRATORY ANALYSIS:  There were a total of 256 respiratory events:  245 obstructive apneas, 0 central apneas and 10 mixed apneas with a total of 255 apneas and an apnea index (AI) of 79.1. There were 1 hypopneas with a hypopnea index of .3. The patient also had 0 respiratory event related arousals (RERAs).  Snoring was noted.     The total APNEA/HYPOPNEA INDEX (AHI) was 79.4 /hour and the total RESPIRATORY DISTURBANCE INDEX was 79.4 /hour.  30 events occurred in REM sleep and 12 events in NREM. The REM AHI was 72, /hour versus a non-REM AHI of 80.5 /hour. The patient spent 0 minutes sleep time in the supine position 389 minutes in non-supine. The supine AHI was 0.0 /hour versus a non-supine AHI of 79.4 /hour.  OXYGEN SATURATION & C02:  The wake baseline 02 saturation was 98%, with the lowest being 70%. Time spent below 89% saturation equaled 75 minutes.  PERIODIC LIMB MOVEMENTS: The patient had a total of 0 Periodic Limb Movements.  The Periodic Limb Movement (PLM) index was 0 /hour and the PLM Arousal index was 0 /hour.  Audio and video analysis did not show any abnormal or unusual movements, behaviors, phonations or vocalizations. The patient took 4 bathroom breaks. Moderate snoring was noted. The EKG was in keeping with normal sinus rhythm (NSR).   TITRATION STUDY  WITH CPAP RESULTS:   The patient was fitted with a large FFM (Simplus). CPAP was initiated at 4 cm H20 with heated humidity per AASM split night standards and pressure was advanced to 11 cm H20 because of hypopneas, apneas and desaturations.  At a PAP pressure of 11 cmH20, there was a reduction of the AHI to 4.3/hour with non-supine REM sleep achieved and O2 nadir of 87%.   Total recording time  (TRT) was 236 minutes, with a total sleep time (TST) of 195.5 minutes. The patient's sleep latency was 31 minutes. REM latency was 71 minutes.  The sleep efficiency was 82.8 %.    SLEEP ARCHITECTURE: Wake after sleep was 37.5 minutes, Stage N1 11 minutes, Stage N2 134.5 minutes, Stage N3 0 minutes and Stage R (REM sleep) 50 minutes. The percentages were: Stage N1 5.6%, Stage N2 68.8%, Stage N3 was absent and Stage R (REM sleep) 25.6%. The arousals were noted as: 16 were spontaneous, 1 were associated with PLMs, 4 were associated with respiratory events.  RESPIRATORY ANALYSIS:  There were a total of 73 respiratory events: 45 obstructive apneas, 0 central apneas and 2 mixed apneas with a total of 47 apneas and an apnea index (AI) of 14.4. There were 26 hypopneas with a hypopnea index of 8. /hour. The patient also had 1 respiratory event related arousals (RERAs).      The total APNEA/HYPOPNEA INDEX  (AHI) was 22.4 /hour and the total RESPIRATORY DISTURBANCE INDEX was 22.7 /hour.  2 events occurred in REM sleep and 71 events in NREM. The REM AHI was 2.4 /hour versus a non-REM AHI of 29.3 /hour. REM sleep was achieved on a pressure of  cm/h2o (AHI was  .) The patient spent 0% of total sleep time in the supine position. The supine AHI was 0.0 /hour, versus a non-supine AHI of 22.4/hour.  OXYGEN SATURATION & C02:  The wake baseline 02 saturation was 91%, with the lowest being 76%. Time spent below 89% saturation equaled 50 minutes.  PERIODIC LIMB MOVEMENTS: The patient had a total of 268 Periodic Limb Movements. The Periodic Limb Movement (PLM) index was 82.3 /hour and the PLM Arousal index was .3 /hour.  Post-study, the patient indicated that sleep was the same as usual.  POLYSOMNOGRAPHY IMPRESSION :  1. Obstructive Sleep Apnea (OSA)  2. Periodic Limb Movement Disorder (PLMD)  RECOMMENDATIONS:  1. This patient has severe obstructive sleep apnea and responded well on CPAP therapy. Due to the final AHI  of 4.3/hour and O2 nadir of 87% on the pressure of 11 cm, I will recommend a home CPAP treatment pressure of 12 cm via large FFM, with heated humidity. The patient should be reminded to be fully compliant with PAP therapy to improve sleep related symptoms and decrease long term cardiovascular risks. Please note that untreated obstructive sleep apnea may carry additional perioperative morbidity. Patients with significant obstructive sleep apnea should receive perioperative PAP therapy and the surgeons and particularly the anesthesiologist should be informed of the diagnosis and the severity of the sleep disordered breathing.  2. Severe PLMs (periodic limb movements of sleep) were noted during the treatment portion of the study with no significant arousals; clinical correlation is recommended. 3. The patient should be cautioned not to drive, work at heights, or operate dangerous or heavy equipment when tired or sleepy. Review and reiteration of good sleep hygiene measures should be pursued with any patient. 4. The patient will be seen in follow-up in the sleep clinic at [facility]  for discussion of the test results, symptom and treatment compliance review, further management strategies, etc. The referring provider will be notified of the test results.  I certify that I have reviewed the entire raw data recording prior to the issuance of this report in accordance with the Standards of Accreditation of the American Academy of Sleep Medicine (AASM)   Star Age, MD, PhD Diplomat, American Board of Neurology and Sleep Medicine (Neurology and Sleep Medicine)

## 2017-12-24 NOTE — Progress Notes (Signed)
Patient referred by Dr. Chalmers Cater, seen by me on 12/11/17, split night sleep study on 12/22/17. Please call and notify patient that the recent sleep study confirmed the diagnosis of severe OSA. He did very well with CPAP during the study with significant improvement of the respiratory events. Therefore, I would like start the patient on CPAP therapy at home by prescribing a machine for home use. I placed the order in the chart.  Please advise patient that we need a follow up appointment with either myself or one of our nurse practitioners in about 10 weeks post set-up to check for how the patient is feeling and how well the patient is using the machine, etc. Please go ahead and schedule the appointment, while you have the patient on the phone and make sure patient understands the importance of keeping this window for the FU appointment, as it is often an insurance requirement. Failing to adhere to this may result in losing coverage for sleep apnea treatment, at which point most patients are left with a choice of returning the machine or paying out of pocket (and we want neither of this to happen!).  Please re-enforce the importance of compliance with treatment and the need for [place] to monitor compliance data - again an insurance requirement and usually a good feedback for the patient as far as how they are doing.  Also remind patient, that any PAP machine or mask issues should be first addressed with the DME company, who provided the machine/mask.  Please ask if patient has a preference regarding DME company, may depend on the insurance too.  Please arrange for CPAP set up at home through a DME company of patient's choice.  Once you have spoken to the patient you can close the phone encounter. Please fax/route report to referring provider, thanks,   Star Age, MD, PhD [facility])

## 2017-12-24 NOTE — Telephone Encounter (Signed)
-----   Message from Star Age, MD sent at 12/24/2017  7:40 AM EDT ----- Patient referred by Dr. Chalmers Cater, seen by me on 12/11/17, split night sleep study on 12/22/17. Please call and notify patient that the recent sleep study confirmed the diagnosis of severe OSA. He did very well with CPAP during the study with significant improvement of the respiratory events. Therefore, I would like start the patient on CPAP therapy at home by prescribing a machine for home use. I placed the order in the chart.  Please advise patient that we need a follow up appointment with either myself or one of our nurse practitioners in about 10 weeks post set-up to check for how the patient is feeling and how well the patient is using the machine, etc. Please go ahead and schedule the appointment, while you have the patient on the phone and make sure patient understands the importance of keeping this window for the FU appointment, as it is often an insurance requirement. Failing to adhere to this may result in losing coverage for sleep apnea treatment, at which point most patients are left with a choice of returning the machine or paying out of pocket (and we want neither of this to happen!).  Please re-enforce the importance of compliance with treatment and the need for [place] to monitor compliance data - again an insurance requirement and usually a good feedback for the patient as far as how they are doing.  Also remind patient, that any PAP machine or mask issues should be first addressed with the DME company, who provided the machine/mask.  Please ask if patient has a preference regarding DME company, may depend on the insurance too.  Please arrange for CPAP set up at home through a DME company of patient's choice.  Once you have spoken to the patient you can close the phone encounter. Please fax/route report to referring provider, thanks,   Star Age, MD, PhD [facility])

## 2017-12-24 NOTE — Addendum Note (Signed)
Addended by: Star Age on: 12/24/2017 07:40 AM   Modules accepted: Orders

## 2018-01-13 ENCOUNTER — Encounter: Payer: Medicare Other | Admitting: Neurology

## 2018-01-22 ENCOUNTER — Ambulatory Visit: Payer: Medicare Other | Admitting: Neurology

## 2018-03-16 ENCOUNTER — Encounter: Payer: Self-pay | Admitting: Neurology

## 2018-03-18 ENCOUNTER — Encounter: Payer: Self-pay | Admitting: Neurology

## 2018-03-18 ENCOUNTER — Ambulatory Visit: Payer: Medicare Other | Admitting: Neurology

## 2018-03-18 VITALS — BP 177/84 | HR 68 | Ht 71.5 in | Wt 272.0 lb

## 2018-03-18 DIAGNOSIS — E0842 Diabetes mellitus due to underlying condition with diabetic polyneuropathy: Secondary | ICD-10-CM | POA: Diagnosis not present

## 2018-03-18 DIAGNOSIS — G4733 Obstructive sleep apnea (adult) (pediatric): Secondary | ICD-10-CM | POA: Diagnosis not present

## 2018-03-18 DIAGNOSIS — R29898 Other symptoms and signs involving the musculoskeletal system: Secondary | ICD-10-CM

## 2018-03-18 DIAGNOSIS — Z9989 Dependence on other enabling machines and devices: Secondary | ICD-10-CM | POA: Diagnosis not present

## 2018-03-18 NOTE — Telephone Encounter (Signed)
Patient was seen on September 18 th . I am getting records faxed .  I have left a message also about muscle biopsy . Thanks Hinton Dyer

## 2018-03-18 NOTE — Progress Notes (Signed)
Subjective:    Patient ID: Andrew Larsen is a 77 y.o. male.  HPI     Interim history:   Andrew Larsen is a 76 -year-old right-handed gentleman with an underlying complex medical history of peripheral vascular disease, hypertension, hyperlipidemia, glaucoma, reflux disease, bilateral ankle fractures with status post surgeries, arthroscopic right knee surgery and status post left femoral stent placement in February 2018, diabetes, chronic cough, prior smoking, and obesity, who presents for follow-up consultation of his leg weakness as well as his sleep apnea diagnosis. The patient is unaccompanied today. I first met him on 12/11/2017 at the request of his endocrinologist, and which time he reported weakness in his legs and dizzy spells and recurrent falls. He also endorsed symptoms of sleep apnea with witnessed apneas reported and snoring. I suggested we proceed with an EMG and nerve conduction study as well as sleep testing. EMG and nerve conduction testing from 12/16/2017 showed: IMPRESSION:   Nerve conduction studies done on both lower extremities shows evidence of a primarily axonal peripheral neuropathy of mild to moderate severity.  EMG evaluations on the lower extremities show evidence of mild proximal myopathic changes, distally there appears to be acute denervation with lower motor unit amplitudes but decreased recruitment.  This would suggest some degree of neuropathic denervation distally as well.  If a muscle biopsy is recommended, would suggest the left vastus lateralis muscle.  He was called with his test results and advised to discuss with his PCP the possibility of stopping his cholesterol medication and I suggested we proceed with a muscle biopsy next. He was referred to neurosurgery for this. Level was elevated and he was advised to consider coming off of his cholesterol medication for that reason.  He had a split-night sleep study on 12/22/2017 and I reviewed the results with him in  detail today. Baseline sleep efficiency was 83.6%, sleep latency 3.5 minutes, REM latency 93 minutes. His total AHI was highly elevated at 79.4 per hour. He had absence of slow-wave sleep in the first part of the study. Average oxygen saturation was 98%, nadir was 70%. He had no significant PLMS. He had for bathroom breaks for the night. He was fitted with a full face mask and CPAP was titrated to 11 cm. On the final pressure his AHI was 4.3 per hour with nonsupine REM sleep achieved and O2 nadir 87%. He had severe PLMS during the second part of the study with no significant arousals. He had absence of slow-wave sleep and a REM percentage of 25.6 during the treatment portion of the study. I suggested a home treatment pressure of 12 cm.  Today, 03/18/2018: I reviewed his CPAP compliance data from 02/15/2018 through 03/16/2018 which is a total of 30 days, during which time he used his CPAP 28 days with percent used days greater than 4 hours at 86.7%, indicating very good compliance with an average usage of 5 hours and 24 minutes, residual AHI elevated at 15.8 per hour, leak on the low side, CPAP pressure of 12 cm. He reports doing well with CPAP, he has had initial problems adjusting but is doing well at this point, he is motivated to continue with treatment. He has noticed improvement in his sleep quality and nocturia is much less. He also had some changes to his medication regimen. He has had some interim falls, thankfully without injuries, weakness is about the same and neuropathy has become a little bit worse. He had increase in his gabapentin as well. He has  had elevated systolic blood pressure values and is going to follow-up with his PCP about this. He would like to switch off of the valsartan. He was taken off of pravastatin and started on a injectable medication for cholesterol management but the cost is too high and he will follow up with his PCP about this. He does endorse discomfort in his feet at night  and the need to scratch his feet or move his feet.  The patient's allergies, current medications, family history, past medical history, past social history, past surgical history and problem list were reviewed and updated as appropriate.  Previously:  12/11/2017: (He) reports intermittent dizzy spells but more often he feels weaker in his legs particularly in his thigh muscles. He has fallen. He denies a spinning sensation. He reports pain in both groin areas. Of note, he has arthritis problems and ongoing issues with his right knee as well. He was told that he is hyperextending his right knee. He had arthroscopic surgery to the knee and had surgeries to both ankles after fractures. He has gained weight. I reviewed your office note from 11/06/2017, which you kindly included. His A1c from December 2018 was 7.0. Most recent A1c was 6 per your office note. He had a sleep study many years ago but is not sure what it showed. In 2017 he was referred to me for sleep evaluation but did not decide to go through with the consultation. He would be willing to get tested for sleep apnea. He does not sleep well. He snores and has witnessed apneas. He is widowed and lives with his only daughter. He quit smoking in the 80s and does not utilize alcohol on a regular basis, drinks caffeine in the form of coffee, 3-4 times per week, not daily. His brother may have sleep apnea. He has occasional morning headaches and reports nocturia about 3 times per average night. He has significant lower extremity swelling. He takes Lasix as needed for this. He reports tingling and burning sensation in both feet, particularly on the bottom of both feet, he carries a diagnosis of diabetic neuropathy.    His Past Medical History Is Significant For: Past Medical History:  Diagnosis Date  . Daytime somnolence   . Diabetes mellitus without complication ([place])   . Fracture    right fibula/wears boot cast  . GERD (gastroesophageal reflux  disease)   . Glaucoma   . Hypercholesteremia   . Hyperlipidemia   . Hypertension   . Mold exposure     His Past Surgical History Is Significant For: Past Surgical History:  Procedure Laterality Date  . LOWER EXTREMITY ANGIOGRAPHY N/A 06/12/2016   Procedure: Lower Extremity Angiography;  Surgeon: Adrian Prows, MD;  Location: [facility];  Service: Cardiovascular;  Laterality: N/A;  . ORIF ANKLE FRACTURE Right 10/02/2012   Procedure: OPEN REDUCTION INTERNAL FIXATION (ORIF) ANKLE FRACTURE;  Surgeon: Meredith Pel, MD;  Location: [facility];  Service: Orthopedics;  Laterality: Right;  . PERIPHERAL VASCULAR ATHERECTOMY Left 06/12/2016   Procedure: Peripheral Vascular Atherectomy-Left Popliteal;  Surgeon: Adrian Prows, MD;  Location: [facility];  Service: Cardiovascular;  Laterality: Left;  . PERIPHERAL VASCULAR INTERVENTION Left 06/12/2016   Procedure: Peripheral Vascular Intervention- DCB Left Popliteal;  Surgeon: Adrian Prows, MD;  Location: [facility];  Service: Cardiovascular;  Laterality: Left;    His Family History Is Significant For: Family History  Problem Relation Age of Onset  . Heart disease Father   . Prostate cancer  Brother   . Cancer Brother   . Diabetes Brother   . Colon cancer Neg Hx     His Social History Is Significant For: Social History   Socioeconomic History  . Marital status: Married    Spouse name: Not on file  . Number of children: Not on file  . Years of education: Not on file  . Highest education level: Not on file  Occupational History  . Not on file  Social Needs  . Financial resource strain: Not on file  . Food insecurity:    Worry: Not on file    Inability: Not on file  . Transportation needs:    Medical: Not on file    Non-medical: Not on file  Tobacco Use  . Smoking status: Former Smoker    Packs/day: 1.00    Years: 20.00    Pack years: 20.00    Last attempt to quit: 08/22/1981    Years since quitting: 36.5  . Smokeless  tobacco: Never Used  Substance and Sexual Activity  . Alcohol use: No    Alcohol/week: 0.0 standard drinks  . Drug use: No  . Sexual activity: Never  Lifestyle  . Physical activity:    Days per week: Not on file    Minutes per session: Not on file  . Stress: Not on file  Relationships  . Social connections:    Talks on phone: Not on file    Gets together: Not on file    Attends religious service: Not on file    Active member of club or organization: Not on file    Attends meetings of clubs or organizations: Not on file    Relationship status: Not on file  Other Topics Concern  . Not on file  Social History Narrative   ** Merged History Encounter **        His Allergies Are:  Allergies  Allergen Reactions  . Metformin And Related   . Simbrinza [Brinzolamide-Brimonidine]     Drainage, redness to eyes  :   His Current Medications Are:  Outpatient Encounter Medications as of 03/18/2018  Medication Sig  . aspirin 81 MG chewable tablet Chew 81 mg by mouth at bedtime.   . brimonidine-timolol (COMBIGAN) 0.2-0.5 % ophthalmic solution Place 1 drop into both eyes every 12 (twelve) hours.  . Calcium Carbonate (CALCIUM 600 PO) Take 2 capsules by mouth daily.   . Cholecalciferol (VITAMIN D3) 5000 units TABS Take 10,000 Units by mouth daily.   . cilostazol (PLETAL) 100 MG tablet Take 100 mg by mouth 2 (two) times daily.  . famotidine (PEPCID) 20 MG tablet Take 20 mg by mouth at bedtime.  . furosemide (LASIX) 40 MG tablet Take 40 mg by mouth daily.  Marland Kitchen gabapentin (NEURONTIN) 300 MG capsule Take 600 mg by mouth 3 (three) times daily.   . insulin NPH-regular Human (NOVOLIN 70/30) (70-30) 100 UNIT/ML injection Inject into the skin 2 (two) times daily with a meal. 70 units in am and 50 units in the evening  . JANUVIA 100 MG tablet Take 100 mg by mouth daily.  Marland Kitchen latanoprost (XALATAN) 0.005 % ophthalmic solution Place 1 drop into both eyes at bedtime.  . magnesium gluconate (MAGONATE) 500 MG  tablet Take 500 mg by mouth daily.  . Multiple Vitamin (MULTIVITAMIN) capsule Take 1 capsule by mouth daily.   Marland Kitchen omeprazole (PRILOSEC) 40 MG capsule Take 40 mg by mouth 2 (two) times daily.   Marland Kitchen oxybutynin (DITROPAN) 5 MG tablet Take  5 mg by mouth 2 (two) times daily.  . potassium chloride SA (K-DUR,KLOR-CON) 20 MEQ tablet Take 20 mEq by mouth daily.  . tamsulosin (FLOMAX) 0.4 MG CAPS capsule Take 0.4 mg by mouth daily.  Marland Kitchen VALSARTAN PO Take by mouth.  . [DISCONTINUED] albuterol (PROVENTIL HFA;VENTOLIN HFA) 108 (90 Base) MCG/ACT inhaler Inhale 2 puffs into the lungs every 4 (four) hours as needed for wheezing or shortness of breath.  . [DISCONTINUED] pravastatin (PRAVACHOL) 40 MG tablet Take 40 mg by mouth at bedtime.   . [DISCONTINUED] triamterene-hydrochlorothiazide (DYAZIDE) 37.5-25 MG capsule Take 1 capsule by mouth daily.   No facility-administered encounter medications on file as of 03/18/2018.   :  Review of Systems:  Out of a complete 14 point review of systems, all are reviewed and negative with the exception of these symptoms as listed below:  Review of Systems  Neurological:       Pt presents today to discuss his cpap. Pt is sleeping better with his cpap. Pt reports that he was never called to get his muscle biopsy scheduled.    Objective:  Neurological Exam  Physical Exam Physical Examination:   Vitals:   03/18/18 1402  BP: (!) 177/84  Pulse: 68    General Examination: The patient is a very pleasant 77 y.o. male in no acute distress. He appears well-developed and well-nourished and well groomed.   HEENT: Normocephalic, atraumatic, pupils are equal, round and reactive to light and accommodation. Corrective eyeglasses in place. Extraocular tracking is good without limitation to gaze excursion or nystagmus noted. Normal smooth pursuit is noted. Hearing is grossly intact. Face is symmetric with normal facial animation and normal facial sensation. Speech is clear with no  dysarthria noted. There is no hypophonia. There is no lip, neck/head, jaw or voice tremor. Neck shows FROM. Oropharynx exam reveals: mild mouth dryness, adequate dental hygiene and marked airway crowding. Tongue protrudes centrally and palate elevates symmetrically.  Chest: Clear to auscultation without wheezing, rhonchi or crackles noted.  Heart: S1+S2+0, regular and normal without murmurs, rubs or gallops noted.   Abdomen: Soft, non-tender and non-distended with normal bowel sounds appreciated on auscultation.  Extremities: There is 1+ pitting edema in the distal lower extremities bilaterally.   Skin: Warm and dry without trophic changes noted.  Musculoskeletal: exam reveals limited mobility in the left ankle. Hardware in place in the right ankle. Bilateral ankle swelling as well, he has groin pain bilaterally. He feels achy in both thighs, unchanged.  Neurologically:  Mental status: The patient is awake, alert and oriented in all 4 spheres. His immediate and remote memory, attention, language skills and fund of knowledge are appropriate. There is no evidence of aphasia, agnosia, apraxia or anomia. Speech is clear with normal prosody and enunciation. Thought process is linear. Mood is normal and affect is normal.  Cranial nerves II - XII are as described above under HEENT exam. In addition: shoulder shrug is normal with equal shoulder height noted. Motor exam: Normal bulk, strength and tone is noted with the exception of decreased muscle bulk in the thigh musclesand flexor weakness noticed bilaterally, right side a little worse than left. Reflexes are 1+ in the upper extremities, trace in the left knee, absent in the right knee and absent in the ankles. There is no tremor. Romberg is not tested d/t safety concerns. Fine motor skills are globally mildly impaired particularly in both feet. Sensory exam shows decreased sensation in the lower extremities. Cerebellar testing: No dysmetria or  intention  tremor, heel to shin is not possible. Gait, station and balance: He stands with difficulty and has to push himself up. Walks slowly and cautiously, with a single-point cane.   Assessment and Plan:    In summary, KERRIE LATOUR is a very pleasant 78 year old male with an underlying complex medical history of peripheral vascular disease, hypertension, hyperlipidemia, glaucoma, reflux disease, bilateral ankle fractures with status post surgeries, arthroscopic right knee surgery and status post left femoral stent placement in February 2018, diabetes, chronic cough, prior smoking, and obesity, who presents for follow-up consultation of his gait disorder, weakness and sleep apnea. He had a split-night sleep study on August 2019 which confirmed severe obstructive sleep apnea. He has done well with CPAP therapy, he is compliant with treatment and noticed an improvement in his sleep consolidation and sleep quality, and particularly in his nocturia. He is motivated to continue. He is using a fullface mask. He is advised to be consistent with his CPAP usage. I would like to increase the pressure to 13 cm for optimization of his obstructive sleep apnea treatment. He is willing to do this. Furthermore, he is advised to talk to his primary care physician about blood pressure management and cholesterol management. He had an elevated CK level, EMG and nerve conduction testing indicative of diabetic neuropathy but also underlying possible myopathy. Diabetes can also predispose to myopathic changes. He is advised that we would resend his referral to neurosurgery to proceed with a muscle biopsy of his left thigh muscle. Call him with those test results in the interim, otherwise I will see him back in follow-up routinely in 6 months. I answered all his questions today and he was in agreement. I spent 30 minutes in total face-to-face time with the patient, more than 50% of which was spent in counseling and coordination  of care, reviewing test results, reviewing medication and discussing or reviewing the diagnosis of PN, OSA, its prognosis and treatment options. Pertinent laboratory and imaging test results that were available during this visit with the patient were reviewed by me and considered in my medical decision making (see chart for details).

## 2018-03-18 NOTE — Progress Notes (Signed)
Order for pressure change sent to [facility] via community message in [employer]. Confirmation received that the order transmitted was successful.

## 2018-03-18 NOTE — Telephone Encounter (Signed)
Pt came to his appt today and informed me that he was never contacted by [facility] to schedule his muscle biopsy.

## 2018-03-18 NOTE — Patient Instructions (Addendum)
Please continue using your CPAP regularly. While your insurance requires that you use CPAP at least 4 hours each night on 70% of the nights, I recommend, that you not skip any nights and use it throughout the night if you can. Getting used to CPAP and staying with the treatment long term does take time and patience and discipline. Untreated obstructive sleep apnea when it is moderate to severe can have an adverse impact on cardiovascular health and raise her risk for heart disease, arrhythmias, hypertension, congestive heart failure, stroke and diabetes. Untreated obstructive sleep apnea causes sleep disruption, nonrestorative sleep, and sleep deprivation. This can have an impact on your day to day functioning and cause daytime sleepiness and impairment of cognitive function, memory loss, mood disturbance, and problems focussing. Using CPAP regularly can improve these symptoms. Would like to increase your CPAP pressure to 13 cm to see if we can improve your residual sleep apnea. We will also resubmit the referral to neurosurgery for your muscle biopsy. Keep up the good work! I will see you back in 6 months for sleep apnea check up, and if you continue to do well on CPAP I will see you once a year thereafter.

## 2018-03-19 NOTE — Telephone Encounter (Signed)
Called Neuro surgery back and they did not do the biopsy they stated they did not see it on the referral I stated to them it was clearly there . I asked if if patient could be called ASAP  please to have his biopsy done CA . Neuro surgery  Will call me back with an apt. Thanks Hinton Dyer. [phone].

## 2018-03-20 NOTE — Telephone Encounter (Signed)
Received records from [facility] regarding pt's visit on 01/15/18 with Dr. Annette Stable.

## 2018-04-10 ENCOUNTER — Other Ambulatory Visit: Payer: Self-pay | Admitting: Neurosurgery

## 2018-04-25 NOTE — Pre-Procedure Instructions (Signed)
JAKARI SADA  04/25/2018      [id number] [address], [place] AT [address] [place] [address] Phone: [phone] Fax: [phone]    Your procedure is scheduled on Tuesday, January 7th.  Report to [facility] Admitting at 6:00 A.M.  Call this number if you have problems the morning of surgery:  [phone]   Remember:  Do not eat or drink after midnight.    Take these medicines the morning of surgery with A SIP OF WATER  brimonidine-timolol (COMBIGAN) gabapentin (NEURONTIN) omeprazole (PRILOSEC)  oxybutynin (DITROPAN) tamsulosin (FLOMAX)  Follow your surgeon's instructions on when to stop/reume Asprin and Pletal.  If no instructions were given by your surgeon then you will need to call the office to get those instructions.    7 days prior to surgery STOP taking any Aleve, Naproxen, Ibuprofen, Motrin, Advil, Goody's, BC's, all herbal medications, fish oil, and all vitamins.    WHAT DO I DO ABOUT MY DIABETES MEDICATION?   Marland Kitchen Do not take oral diabetes medicines (pills) the morning of surgery. Do not take your JANUVIA.   Marland Kitchen THE NIGHT BEFORE SURGERY, take 21 units of (NOVOLIN 70/30) insulin.       . THE MORNING OF SURGERY, do not take (NOVOLIN 70/30).    How to Manage Your Diabetes Before and After Surgery  Why is it important to control my blood sugar before and after surgery? . Improving blood sugar levels before and after surgery helps healing and can limit problems. . A way of improving blood sugar control is eating a healthy diet by: o  Eating less sugar and carbohydrates o  Increasing activity/exercise o  Talking with your doctor about reaching your blood sugar goals . High blood sugars (greater than 180 mg/dL) can raise your risk of infections and slow your recovery, so you will need to focus on controlling your diabetes during the weeks before surgery. . Make sure  that the doctor who takes care of your diabetes knows about your planned surgery including the date and location.  How do I manage my blood sugar before surgery? . Check your blood sugar at least 4 times a day, starting 2 days before surgery, to make sure that the level is not too high or low. o Check your blood sugar the morning of your surgery when you wake up and every 2 hours until you get to the Short Stay unit. . If your blood sugar is less than 70 mg/dL, you will need to treat for low blood sugar: o Do not take insulin. o Treat a low blood sugar (less than 70 mg/dL) with  cup of clear juice (cranberry or apple), 4 glucose tablets, OR glucose gel. o Recheck blood sugar in 15 minutes after treatment (to make sure it is greater than 70 mg/dL). If your blood sugar is not greater than 70 mg/dL on recheck, call [phone] for further instructions. . Report your blood sugar to the short stay nurse when you get to Short Stay.  . If you are admitted to the hospital after surgery: o Your blood sugar will be checked by the staff and you will probably be given insulin after surgery (instead of oral diabetes medicines) to make sure you have good blood sugar levels. o The goal for blood sugar control after surgery is 80-180 mg/dL.    Do not wear jewelry.  Do not wear lotions,  powders, or colonges, or deodorant.  Do not shave 48 hours prior to surgery.  Men may shave face and neck.  Do not bring valuables to the hospital.  [facility] is not responsible for any belongings or valuables.  Contacts, dentures or bridgework may not be worn into surgery.  Leave your suitcase in the car.  After surgery it may be brought to your room.  For patients admitted to the hospital, discharge time will be determined by your treatment team.  Patients discharged the day of surgery will not be allowed to drive home.   Special instructions:   [place]- Preparing For Surgery  Before surgery, you can play an  important role. Because skin is not sterile, your skin needs to be as free of germs as possible. You can reduce the number of germs on your skin by washing with CHG (chlorahexidine gluconate) Soap before surgery.  CHG is an antiseptic cleaner which kills germs and bonds with the skin to continue killing germs even after washing.    Oral Hygiene is also important to reduce your risk of infection.  Remember - BRUSH YOUR TEETH THE MORNING OF SURGERY WITH YOUR REGULAR TOOTHPASTE  Please do not use if you have an allergy to CHG or antibacterial soaps. If your skin becomes reddened/irritated stop using the CHG.  Do not shave (including legs and underarms) for at least 48 hours prior to first CHG shower. It is OK to shave your face.  Please follow these instructions carefully.   1. Shower the NIGHT BEFORE SURGERY and the MORNING OF SURGERY with CHG.   2. If you chose to wash your hair, wash your hair first as usual with your normal shampoo.  3. After you shampoo, rinse your hair and body thoroughly to remove the shampoo.  4. Use CHG as you would any other liquid soap. You can apply CHG directly to the skin and wash gently with a scrungie or a clean washcloth.   5. Apply the CHG Soap to your body ONLY FROM THE NECK DOWN.  Do not use on open wounds or open sores. Avoid contact with your eyes, ears, mouth and genitals (private parts). Wash Face and genitals (private parts)  with your normal soap.  6. Wash thoroughly, paying special attention to the area where your surgery will be performed.  7. Thoroughly rinse your body with warm water from the neck down.  8. DO NOT shower/wash with your normal soap after using and rinsing off the CHG Soap.  9. Pat yourself dry with a CLEAN TOWEL.  10. Wear CLEAN PAJAMAS to bed the night before surgery, wear comfortable clothes the morning of surgery  11. Place CLEAN SHEETS on your bed the night of your first shower and DO NOT SLEEP WITH PETS.    Day of  Surgery:  Do not apply any deodorants/lotions.  Please wear clean clothes to the hospital/surgery center.   Remember to brush your teeth WITH YOUR REGULAR TOOTHPASTE.  Please read over the following fact sheets that you were given.

## 2018-04-25 NOTE — Progress Notes (Signed)
Contacted Vanessa at Dr. Marchelle Folks office regarding when to stop/resume Pletal and baby ASA. Instructed that these need to be held 5-7 days prior to surgery. Pt will be made aware.   Jacqlyn Larsen, RN

## 2018-04-28 ENCOUNTER — Other Ambulatory Visit: Payer: Self-pay

## 2018-04-28 ENCOUNTER — Encounter (HOSPITAL_COMMUNITY): Payer: Self-pay

## 2018-04-28 ENCOUNTER — Other Ambulatory Visit: Payer: Self-pay | Admitting: Neurosurgery

## 2018-04-28 ENCOUNTER — Encounter (HOSPITAL_COMMUNITY)
Admission: RE | Admit: 2018-04-28 | Discharge: 2018-04-28 | Disposition: A | Discharge status: Home / Self Care | Payer: Medicare Other | Source: Ambulatory Visit | Attending: Neurosurgery | Admitting: Neurosurgery

## 2018-04-28 DIAGNOSIS — I10 Essential (primary) hypertension: Secondary | ICD-10-CM | POA: Insufficient documentation

## 2018-04-28 DIAGNOSIS — Z01818 Encounter for other preprocedural examination: Secondary | ICD-10-CM | POA: Diagnosis present

## 2018-04-28 DIAGNOSIS — Z87891 Personal history of nicotine dependence: Secondary | ICD-10-CM | POA: Diagnosis not present

## 2018-04-28 HISTORY — DX: Peripheral vascular disease, unspecified: I73.9

## 2018-04-28 HISTORY — DX: Sleep apnea, unspecified: G47.30

## 2018-04-28 HISTORY — DX: Chronic kidney disease, unspecified: N18.9

## 2018-04-28 LAB — CBC
HCT: 43.1 % (ref 39.0–52.0)
Hemoglobin: 13.9 g/dL (ref 13.0–17.0)
MCH: 30.3 pg (ref 26.0–34.0)
MCHC: 32.3 g/dL (ref 30.0–36.0)
MCV: 93.9 fL (ref 80.0–100.0)
PLATELETS: 188 10*3/uL (ref 150–400)
RBC: 4.59 MIL/uL (ref 4.22–5.81)
RDW: 13.9 % (ref 11.5–15.5)
WBC: 6.6 10*3/uL (ref 4.0–10.5)
nRBC: 0 % (ref 0.0–0.2)

## 2018-04-28 LAB — BASIC METABOLIC PANEL
Anion gap: 11 (ref 5–15)
BUN: 26 mg/dL — ABNORMAL HIGH (ref 8–23)
CO2: 22 mmol/L (ref 22–32)
Calcium: 8.9 mg/dL (ref 8.9–10.3)
Chloride: 104 mmol/L (ref 98–111)
Creatinine, Ser: 1.57 mg/dL — ABNORMAL HIGH (ref 0.61–1.24)
GFR calc non Af Amer: 42 mL/min — ABNORMAL LOW (ref >60)
GFR, EST AFRICAN AMERICAN: 49 mL/min — AB (ref >60)
Glucose, Bld: 129 mg/dL — ABNORMAL HIGH (ref 70–99)
Potassium: 3.9 mmol/L (ref 3.5–5.1)
Sodium: 137 mmol/L (ref 135–145)

## 2018-04-28 LAB — GLUCOSE, CAPILLARY: Glucose-Capillary: 152 mg/dL — ABNORMAL HIGH (ref 70–99)

## 2018-04-28 LAB — HEMOGLOBIN A1C
Hgb A1c MFr Bld: 6.7 % — ABNORMAL HIGH (ref 4.8–5.6)
Mean Plasma Glucose: 145.59 mg/dL

## 2018-04-28 NOTE — Progress Notes (Signed)
Contacted Dr. Marchelle Folks office regarding incorrect laterality on consent. Consent stated left leg pt stated it was the right leg. Consent updated but held in second sign. Will need consent signed on DOS.

## 2018-04-28 NOTE — Progress Notes (Signed)
PCP - Dr. Velna Hatchet Cardiologist - Dr. Einar Gip  Chest x-ray - N/A EKG -04/28/18  Stress Test - 05/04/16-req from Dr. Irven Shelling office ECHO - 05/03/16-requested from Dr. Irven Shelling office Cardiac Cath - 05/2016-requested from Dr. Irven Shelling office  Sleep Study - OSA positvie; uses CPAP nightly.   Fasting Blood Sugar - 80-100 Checks Blood Sugar 2 times a day  Blood Thinner Instructions: Pletal; hold 5-7 days prior to surgery. Aspirin Instructions: Hold 5-7 days prior to surgery.   Anesthesia review: Yes, records requested.   Patient denies shortness of breath, fever, cough and chest pain at PAT appointment   Patient verbalized understanding of instructions that were given to them at the PAT appointment. Patient was also instructed that they will need to review over the PAT instructions again at home before surgery.

## 2018-04-29 ENCOUNTER — Encounter (HOSPITAL_COMMUNITY): Payer: Self-pay

## 2018-04-29 NOTE — Progress Notes (Addendum)
Anesthesia Chart Review:  Case:  [id number] Date/Time:  05/06/18 0745   Procedure:  right vastus lateralis muscle biopsy (Right )   Anesthesia type:  General   Pre-op diagnosis:  muscle weakness   Location:  [facility] OR ROOM 18 / [facility]   Surgeon:  Earnie Larsson, MD      DISCUSSION: Patient is a 77 year old male scheduled for the above procedure. He was seen by neurology on 12/11/17 for evaluation of dizziness and heaviness/weakness in this legs. CK was elevated and nerve and muscles studies showed evidence of diabetic neuropathy and myopathy changes in the thigh muscles. Discontinuing statin medication and getting muscle biopsy where recommended. He was also diagnosed with severe OSA with significant improvement using CPAP.  History includes former smoker (quit '83), PAD (left distal SFA atherectomy/DES 06/12/16), OSA (CPAP), HLD, DM2, HTN, GERD, glaucoma. He didn't report CKD, but labs from 06/05/16 show BUN 24, CR 1.46 (scanned under Media tab) and on 04/28/18 BUN 26, CR 1.57. BMI is consistent with obesity.  He had cardiac testing in 2018 at [facility] including a low risk stress test (see below). He denied SOB, cough, fever, and chest pain at PAT.  I will request most recent PCP records and labs in hopes to ensure stability of his renal function, but based on currently available information I would anticipate that he can proceed as planned If no acute changes. He is > 1 years out from SFA stent. Patient to hold ASA and Pletal 5-7 days prior to surgery.   ADDENDUM 05/02/18 9:01 AM: Records received from [facility]. He was last seen by Dr. Ardeth Perfect on 02/03/18 for annual physical exam. He was aware of plans for muscle biopsy. Problem list includes CKD stage III, Cr 1.6 there on 01/30/18 which is consistent with his preoperative labs results. Will add CKD to his history. Per [facility], patient last seen 03/2017 for PV follow-up. Last cardiac testing 2018 as  outlined.   VS: BP 124/69   Pulse 71   Temp 36.8 C   Ht 5' 11.5" (1.816 m)   Wt 122.8 kg   SpO2 95%   BMI 37.24 kg/m   PROVIDERS: Velna Hatchet, MD is PCP Kela Millin, MD is [facility] cardiologist [facility]).  Star Age, MD is neurologist. Jacelyn Pi, MD is endocrinologist.    LABS: Preoperative labs noted. See Discussion regarding renal labs.   (all labs ordered are listed, but only abnormal results are displayed)  Labs Reviewed  GLUCOSE, CAPILLARY - Abnormal; Notable for the following components:      Result Value   Glucose-Capillary 152 (*)    All other components within normal limits  HEMOGLOBIN A1C - Abnormal; Notable for the following components:   Hgb A1c MFr Bld 6.7 (*)    All other components within normal limits  BASIC METABOLIC PANEL - Abnormal; Notable for the following components:   Glucose, Bld 129 (*)    BUN 26 (*)    Creatinine, Ser 1.57 (*)    GFR calc non Af Amer 42 (*)    GFR calc Af Amer 49 (*)    All other components within normal limits  CBC    EKG: 04/28/18: NSR.   CV: 2018 stress and echo results outlined in Dr. Irven Shelling 06/10/16 H&P (prior to aortogram). Study reports are also scanned under the Media tab, Correspondence 06/12/16.)   Lexiscan myoview stress test 05/04/2016: 1. The resting electrocardiogram demonstrated normal sinus rhythm, normal resting conduction and no  resting arrhythmias. Stress EKG is non-diagnostic for ischemia as it a pharmacologic stress using Lexiscan. Stress symptoms included dyspnea. 2. The perfusion imaging study demonstrates soft tissue attenuation artifact in the inferior wall. There is no demonstrable ischemia or scar. LV systolic function was normal and calculated at 65%. This is a low risk study.  Echocardiogram 05/03/2016: Poor apical views and wall motion with reduced sensitivity. Left ventricle cavity is normal in size. Mild concentric hypertrophy of the left ventricle. Normal global wall  motion. Doppler evidence of grade II (pseudonormal) diastolic dysfunction, elevated LAP. Calculated EF 55%. Trace tricuspid regurgitation. Unable to estimate PA pressure due to absence/minimal TR signal. The aortic root is normal in size. Mild atherosclerotic changes in the aorta.  Abdominal aortogram with left femoral arteriogram distal runoff 06/12/2016: Abdominal aortogram reveals widely patent renal arteries one on either sides, no evidence of abdominal aortic aneurysm, aortoiliac bifurcation widely patent. Bilateral common iliac and external and internal iliac are widely patent. Left femoral arteriogram distal runoff: The left distal SFA and proximal popliteal artery are heavily calcified and there is CTO. Below the left knee, there is two-vessel runoff in the form of peroneal and posterior tibial artery, anterior tibial reconstitutes at the level of the ankle. Interventional Data:  1. PTA and atherectomy with 1.5 mm solid crown CSI diamondback atherectomy followed by drug-coated balloon angioplasty with 5.0 x 1 20 mm impacted Admiral balloon, stenting due to dissection with 6.0 x 1 20 mm Zilver PTX drug-coated stent followed by post dilatation with a 5 mm balloon, stenosis reduced from 100% to 0% 2. Recommendation: Patient will need aggressive risk factor modification and aggressive control of diabetes mellitus. He'll be continued on cilostazol for at least 4-6 weeks and preferably for a year along with aspirin   Past Medical History:  Diagnosis Date  . Daytime somnolence   . Diabetes mellitus without complication ([place])   . Fracture    right fibula/wears boot cast  . GERD (gastroesophageal reflux disease)   . Glaucoma   . Hypercholesteremia   . Hyperlipidemia   . Hypertension   . Mold exposure   . PAD (peripheral artery disease) ([place])   . Sleep apnea     Past Surgical History:  Procedure Laterality Date  . LOWER EXTREMITY ANGIOGRAPHY N/A 06/12/2016   Procedure: Lower Extremity  Angiography;  Surgeon: Adrian Prows, MD;  Location: [facility];  Service: Cardiovascular;  Laterality: N/A;  . ORIF ANKLE FRACTURE Right 10/02/2012   Procedure: OPEN REDUCTION INTERNAL FIXATION (ORIF) ANKLE FRACTURE;  Surgeon: Meredith Pel, MD;  Location: [facility];  Service: Orthopedics;  Laterality: Right;  . PERIPHERAL VASCULAR ATHERECTOMY Left 06/12/2016   Procedure: Peripheral Vascular Atherectomy-Left Popliteal;  Surgeon: Adrian Prows, MD;  Location: [facility];  Service: Cardiovascular;  Laterality: Left;  . PERIPHERAL VASCULAR INTERVENTION Left 06/12/2016   Procedure: Peripheral Vascular Intervention- DCB Left Popliteal;  Surgeon: Adrian Prows, MD;  Location: [facility];  Service: Cardiovascular;  Laterality: Left;    MEDICATIONS: . aspirin 81 MG chewable tablet  . brimonidine-timolol (COMBIGAN) 0.2-0.5 % ophthalmic solution  . calcium carbonate (CALCIUM 600) 600 MG TABS tablet  . Cholecalciferol (VITAMIN D3) 5000 units TABS  . cilostazol (PLETAL) 100 MG tablet  . Evolocumab (REPATHA) 140 MG/ML SOSY  . famotidine (PEPCID) 20 MG tablet  . furosemide (LASIX) 40 MG tablet  . gabapentin (NEURONTIN) 300 MG capsule  . insulin NPH-regular Human (NOVOLIN 70/30) (70-30) 100 UNIT/ML injection  . JANUVIA 100  MG tablet  . latanoprost (XALATAN) 0.005 % ophthalmic solution  . magnesium gluconate (MAGONATE) 500 MG tablet  . Multiple Vitamin (MULTIVITAMIN) capsule  . olmesartan (BENICAR) 20 MG tablet  . omeprazole (PRILOSEC) 40 MG capsule  . oxybutynin (DITROPAN) 5 MG tablet  . potassium chloride SA (K-DUR,KLOR-CON) 20 MEQ tablet  . tamsulosin (FLOMAX) 0.4 MG CAPS capsule   No current facility-administered medications for this encounter.     George Hugh [facility] Short Stay Center/Anesthesiology Phone [phone] 04/29/2018 12:54 PM

## 2018-04-29 NOTE — Anesthesia Preprocedure Evaluation (Addendum)
Anesthesia Evaluation  Patient identified by MRN, date of birth, ID band Patient awake    Reviewed: Allergy & Precautions, NPO status , Patient's Chart, lab work & pertinent test results  Airway Mallampati: III  TM Distance: >3 FB Neck ROM: Full    Dental no notable dental hx.    Pulmonary asthma , sleep apnea and Continuous Positive Airway Pressure Ventilation , former smoker,    Pulmonary exam normal breath sounds clear to auscultation       Cardiovascular hypertension, Pt. on medications + Peripheral Vascular Disease  Normal cardiovascular exam Rhythm:Regular Rate:Normal  ECG: NSR, rate 72   Neuro/Psych negative neurological ROS  negative psych ROS   GI/Hepatic Neg liver ROS, GERD  Medicated and Controlled,  Endo/Other  diabetes, Insulin Dependent  Renal/GU Renal InsufficiencyRenal disease     Musculoskeletal negative musculoskeletal ROS (+)   Abdominal (+) + obese,   Peds  Hematology HLD   Anesthesia Other Findings muscle weakness  Reproductive/Obstetrics                           Anesthesia Physical Anesthesia Plan  ASA: III  Anesthesia Plan: MAC   Post-op Pain Management:    Induction: Intravenous  PONV Risk Score and Plan: 2 and Ondansetron, Propofol infusion and Treatment may vary due to age or medical condition  Airway Management Planned: LMA  Additional Equipment:   Intra-op Plan:   Post-operative Plan:   Informed Consent: I have reviewed the patients History and Physical, chart, labs and discussed the procedure including the risks, benefits and alternatives for the proposed anesthesia with the patient or authorized representative who has indicated his/her understanding and acceptance.   Dental advisory given  Plan Discussed with: CRNA  Anesthesia Plan Comments: (Reviewed PAT note written 04/29/2018 by Myra Gianotti, PA-C. )       Anesthesia Quick  Evaluation

## 2018-05-02 ENCOUNTER — Encounter (HOSPITAL_COMMUNITY): Payer: Self-pay

## 2018-05-04 DIAGNOSIS — G4733 Obstructive sleep apnea (adult) (pediatric): Secondary | ICD-10-CM | POA: Diagnosis not present

## 2018-05-05 MED ORDER — DEXTROSE 5 % IV SOLN
3.0000 g | INTRAVENOUS | Status: AC
  Administered 2018-05-06: 3 g via INTRAVENOUS
  Filled 2018-05-05: qty 3

## 2018-05-06 ENCOUNTER — Ambulatory Visit (HOSPITAL_COMMUNITY): Payer: Medicare HMO | Admitting: Vascular Surgery

## 2018-05-06 ENCOUNTER — Other Ambulatory Visit: Payer: Self-pay

## 2018-05-06 ENCOUNTER — Ambulatory Visit (HOSPITAL_COMMUNITY): Payer: Medicare HMO | Admitting: Certified Registered Nurse Anesthetist

## 2018-05-06 ENCOUNTER — Encounter (HOSPITAL_COMMUNITY)
Admission: RE | Disposition: A | Discharge status: Home / Self Care | Payer: Self-pay | Source: Ambulatory Visit | Attending: Neurosurgery

## 2018-05-06 ENCOUNTER — Encounter (HOSPITAL_COMMUNITY): Payer: Self-pay

## 2018-05-06 ENCOUNTER — Ambulatory Visit (HOSPITAL_COMMUNITY)
Admission: RE | Admit: 2018-05-06 | Discharge: 2018-05-06 | Disposition: A | Discharge status: Home / Self Care | Payer: Medicare HMO | Source: Ambulatory Visit | Attending: Neurosurgery | Admitting: Neurosurgery

## 2018-05-06 DIAGNOSIS — G729 Myopathy, unspecified: Secondary | ICD-10-CM

## 2018-05-06 DIAGNOSIS — E1122 Type 2 diabetes mellitus with diabetic chronic kidney disease: Secondary | ICD-10-CM | POA: Insufficient documentation

## 2018-05-06 DIAGNOSIS — I129 Hypertensive chronic kidney disease with stage 1 through stage 4 chronic kidney disease, or unspecified chronic kidney disease: Secondary | ICD-10-CM | POA: Diagnosis not present

## 2018-05-06 DIAGNOSIS — N183 Chronic kidney disease, stage 3 (moderate): Secondary | ICD-10-CM | POA: Diagnosis not present

## 2018-05-06 DIAGNOSIS — Z87891 Personal history of nicotine dependence: Secondary | ICD-10-CM | POA: Diagnosis not present

## 2018-05-06 DIAGNOSIS — M6281 Muscle weakness (generalized): Secondary | ICD-10-CM | POA: Diagnosis not present

## 2018-05-06 DIAGNOSIS — Z7982 Long term (current) use of aspirin: Secondary | ICD-10-CM | POA: Insufficient documentation

## 2018-05-06 DIAGNOSIS — Z794 Long term (current) use of insulin: Secondary | ICD-10-CM | POA: Insufficient documentation

## 2018-05-06 DIAGNOSIS — K219 Gastro-esophageal reflux disease without esophagitis: Secondary | ICD-10-CM | POA: Insufficient documentation

## 2018-05-06 DIAGNOSIS — G7249 Other inflammatory and immune myopathies, not elsewhere classified: Secondary | ICD-10-CM | POA: Insufficient documentation

## 2018-05-06 DIAGNOSIS — H409 Unspecified glaucoma: Secondary | ICD-10-CM | POA: Insufficient documentation

## 2018-05-06 DIAGNOSIS — G4730 Sleep apnea, unspecified: Secondary | ICD-10-CM | POA: Insufficient documentation

## 2018-05-06 DIAGNOSIS — I10 Essential (primary) hypertension: Secondary | ICD-10-CM | POA: Diagnosis not present

## 2018-05-06 DIAGNOSIS — E1151 Type 2 diabetes mellitus with diabetic peripheral angiopathy without gangrene: Secondary | ICD-10-CM | POA: Diagnosis not present

## 2018-05-06 DIAGNOSIS — Z79899 Other long term (current) drug therapy: Secondary | ICD-10-CM | POA: Insufficient documentation

## 2018-05-06 DIAGNOSIS — E7800 Pure hypercholesterolemia, unspecified: Secondary | ICD-10-CM | POA: Diagnosis not present

## 2018-05-06 HISTORY — PX: MUSCLE BIOPSY: SHX716

## 2018-05-06 LAB — GLUCOSE, CAPILLARY
Glucose-Capillary: 149 mg/dL — ABNORMAL HIGH (ref 70–99)
Glucose-Capillary: 109 mg/dL — ABNORMAL HIGH (ref 70–99)

## 2018-05-06 SURGERY — BIOPSY, MUSCLE, GASTROCNEMIUS
Anesthesia: Monitor Anesthesia Care | Laterality: Right

## 2018-05-06 MED ORDER — ONDANSETRON HCL 4 MG/2ML IJ SOLN: 4.0000 mg | Freq: Once | INTRAMUSCULAR | Status: DC | PRN

## 2018-05-06 MED ORDER — SODIUM CHLORIDE 0.9 % IV SOLN
INTRAVENOUS | Status: DC | PRN
  Administered 2018-05-06: 08:00:00

## 2018-05-06 MED ORDER — PROPOFOL 10 MG/ML IV BOLUS
INTRAVENOUS | Status: DC | PRN
  Administered 2018-05-06: 30 mg via INTRAVENOUS

## 2018-05-06 MED ORDER — PROPOFOL 10 MG/ML IV BOLUS
INTRAVENOUS | Status: AC
  Filled 2018-05-06: qty 40

## 2018-05-06 MED ORDER — FENTANYL CITRATE (PF) 250 MCG/5ML IJ SOLN
INTRAMUSCULAR | Status: AC
  Filled 2018-05-06: qty 5

## 2018-05-06 MED ORDER — LIDOCAINE-EPINEPHRINE 1 %-1:100000 IJ SOLN
INTRAMUSCULAR | Status: DC | PRN
  Administered 2018-05-06: 20 mL

## 2018-05-06 MED ORDER — LIDOCAINE 2% (20 MG/ML) 5 ML SYRINGE
INTRAMUSCULAR | Status: AC
  Filled 2018-05-06: qty 5

## 2018-05-06 MED ORDER — LIDOCAINE 2% (20 MG/ML) 5 ML SYRINGE
INTRAMUSCULAR | Status: DC | PRN
  Administered 2018-05-06: 40 mg via INTRAVENOUS

## 2018-05-06 MED ORDER — LIDOCAINE-EPINEPHRINE 1 %-1:100000 IJ SOLN
INTRAMUSCULAR | Status: AC
  Filled 2018-05-06: qty 1

## 2018-05-06 MED ORDER — ONDANSETRON HCL 4 MG/2ML IJ SOLN
INTRAMUSCULAR | Status: AC
  Filled 2018-05-06: qty 2

## 2018-05-06 MED ORDER — PROPOFOL 500 MG/50ML IV EMUL
INTRAVENOUS | Status: DC | PRN
  Administered 2018-05-06: 50 ug/kg/min via INTRAVENOUS

## 2018-05-06 MED ORDER — LACTATED RINGERS IV SOLN
INTRAVENOUS | Status: DC
  Administered 2018-05-06: 07:00:00 via INTRAVENOUS

## 2018-05-06 MED ORDER — FENTANYL CITRATE (PF) 100 MCG/2ML IJ SOLN
INTRAMUSCULAR | Status: DC | PRN
  Administered 2018-05-06: 25 ug via INTRAVENOUS

## 2018-05-06 MED ORDER — ONDANSETRON HCL 4 MG/2ML IJ SOLN
INTRAMUSCULAR | Status: DC | PRN
  Administered 2018-05-06: 4 mg via INTRAVENOUS

## 2018-05-06 MED ORDER — CHLORHEXIDINE GLUCONATE CLOTH 2 % EX PADS: 6.0000 | MEDICATED_PAD | Freq: Once | CUTANEOUS | Status: DC

## 2018-05-06 MED ORDER — HYDROCODONE-ACETAMINOPHEN 5-325 MG PO TABS: 1.0000 | ORAL_TABLET | ORAL | 0 refills | Status: DC | PRN

## 2018-05-06 MED ORDER — 0.9 % SODIUM CHLORIDE (POUR BTL) OPTIME
TOPICAL | Status: DC | PRN
  Administered 2018-05-06: 1000 mL

## 2018-05-06 MED ORDER — FENTANYL CITRATE (PF) 100 MCG/2ML IJ SOLN: 25.0000 ug | INTRAMUSCULAR | Status: DC | PRN

## 2018-05-06 SURGICAL SUPPLY — 50 items
BAG DECANTER FOR FLEXI CONT (MISCELLANEOUS) ×2 IMPLANT
BENZOIN TINCTURE PRP APPL 2/3 (GAUZE/BANDAGES/DRESSINGS) ×2 IMPLANT
BLADE CLIPPER SURG (BLADE) IMPLANT
BLADE SURG 10 STRL SS (BLADE) ×2 IMPLANT
BLADE SURG 11 STRL SS (BLADE) IMPLANT
BLADE SURG 15 STRL LF DISP TIS (BLADE) ×1 IMPLANT
BLADE SURG 15 STRL SS (BLADE) ×1
CABLE BIPOLOR RESECTION CORD (MISCELLANEOUS) ×1 IMPLANT
CANISTER SUCT 3000ML PPV (MISCELLANEOUS) IMPLANT
COVER WAND RF STERILE (DRAPES) ×2 IMPLANT
DERMABOND ADVANCED (GAUZE/BANDAGES/DRESSINGS) ×1
DERMABOND ADVANCED .7 DNX12 (GAUZE/BANDAGES/DRESSINGS) IMPLANT
DRAPE HALF SHEET 40X57 (DRAPES) ×1 IMPLANT
DRAPE INCISE IOBAN 66X45 STRL (DRAPES) IMPLANT
DRSG OPSITE POSTOP 3X4 (GAUZE/BANDAGES/DRESSINGS) ×1 IMPLANT
DRSG TELFA 3X8 NADH (GAUZE/BANDAGES/DRESSINGS) ×2 IMPLANT
GAUZE 4X4 16PLY RFD (DISPOSABLE) ×2 IMPLANT
GAUZE SPONGE 4X4 12PLY STRL (GAUZE/BANDAGES/DRESSINGS) ×2 IMPLANT
GLOVE ECLIPSE 7.5 STRL STRAW (GLOVE) ×2 IMPLANT
GLOVE ECLIPSE 9.0 STRL (GLOVE) ×4 IMPLANT
GLOVE EXAM NITRILE XL STR (GLOVE) IMPLANT
GLOVE SURG SS PI 8.0 STRL IVOR (GLOVE) ×2 IMPLANT
GOWN STRL REUS W/ TWL LRG LVL3 (GOWN DISPOSABLE) IMPLANT
GOWN STRL REUS W/ TWL XL LVL3 (GOWN DISPOSABLE) IMPLANT
GOWN STRL REUS W/TWL 2XL LVL3 (GOWN DISPOSABLE) ×1 IMPLANT
GOWN STRL REUS W/TWL LRG LVL3 (GOWN DISPOSABLE)
GOWN STRL REUS W/TWL XL LVL3 (GOWN DISPOSABLE)
KIT BASIN OR (CUSTOM PROCEDURE TRAY) ×2 IMPLANT
KIT TURNOVER KIT B (KITS) ×2 IMPLANT
LOCATOR NERVE 3 VOLT (DISPOSABLE) IMPLANT
MARKER SKIN DUAL TIP RULER LAB (MISCELLANEOUS) ×2 IMPLANT
NDL HYPO 25X1 1.5 SAFETY (NEEDLE) ×1 IMPLANT
NEEDLE HYPO 25X1 1.5 SAFETY (NEEDLE) ×2 IMPLANT
NS IRRIG 1000ML POUR BTL (IV SOLUTION) ×2 IMPLANT
PACK LAMINECTOMY NEURO (CUSTOM PROCEDURE TRAY) ×1 IMPLANT
PACK SURGICAL SETUP 50X90 (CUSTOM PROCEDURE TRAY) ×1 IMPLANT
PAD DRESSING TELFA 3X8 NADH (GAUZE/BANDAGES/DRESSINGS) IMPLANT
SPECIMEN JAR SMALL (MISCELLANEOUS) ×2 IMPLANT
STOCKINETTE 4X48 STRL (DRAPES) ×2 IMPLANT
STRIP CLOSURE SKIN 1/2X4 (GAUZE/BANDAGES/DRESSINGS) ×1 IMPLANT
STRIP CLOSURE SKIN 1/4X4 (GAUZE/BANDAGES/DRESSINGS) ×2 IMPLANT
SUT SILK 0 TIES 10X30 (SUTURE) ×1 IMPLANT
SUT VIC AB 2-0 CP2 18 (SUTURE) ×2 IMPLANT
SUT VIC AB 3-0 SH 8-18 (SUTURE) ×2 IMPLANT
SYR BULB 3OZ (MISCELLANEOUS) ×1 IMPLANT
SYR CONTROL 10ML LL (SYRINGE) ×1 IMPLANT
TOWEL GREEN STERILE (TOWEL DISPOSABLE) ×2 IMPLANT
TOWEL GREEN STERILE FF (TOWEL DISPOSABLE) ×2 IMPLANT
TUBE CONNECTING 12X1/4 (SUCTIONS) IMPLANT
WATER STERILE IRR 1000ML POUR (IV SOLUTION) ×2 IMPLANT

## 2018-05-06 NOTE — Brief Op Note (Signed)
05/06/2018  8:44 AM  PATIENT:  Andrew Larsen  78 y.o. male  PRE-OPERATIVE DIAGNOSIS:  muscle weakness  POST-OPERATIVE DIAGNOSIS:  muscle weakness  PROCEDURE:  Procedure(s): right vastus lateralis muscle biopsy (Right)  SURGEON:  Surgeon(s) and Role:    Earnie Larsson, MD - Primary  PHYSICIAN ASSISTANT:   ASSISTANTS:    ANESTHESIA:   general  EBL:  5 mL   BLOOD ADMINISTERED:none  DRAINS: none   LOCAL MEDICATIONS USED:  LIDOCAINE   SPECIMEN:  Source of Specimen:  Right Vastus lateralis  DISPOSITION OF SPECIMEN:  PATHOLOGY  COUNTS:  YES  TOURNIQUET:  * No tourniquets in log *  DICTATION: .Dragon Dictation  PLAN OF CARE: Discharge to home after PACU  PATIENT DISPOSITION:  PACU - hemodynamically stable.   Delay start of Pharmacological VTE agent (>24hrs) due to surgical blood loss or risk of bleeding: not applicable

## 2018-05-06 NOTE — Transfer of Care (Signed)
Immediate Anesthesia Transfer of Care Note  Patient: Andrew Larsen  Procedure(s) Performed: right vastus lateralis muscle biopsy (Right )  Patient Location: PACU  Anesthesia Type:MAC  Level of Consciousness: awake, alert  and oriented  Airway & Oxygen Therapy: Patient Spontanous Breathing  Post-op Assessment: Report given to RN and Post -op Vital signs reviewed and stable  Post vital signs: Reviewed and stable  Last Vitals:  Vitals Value Taken Time  BP    Temp    Pulse 68 05/06/2018  8:49 AM  Resp    SpO2 100 % 05/06/2018  8:49 AM  Vitals shown include unvalidated device data.  Last Pain:  Vitals:   05/06/18 0712  TempSrc:   PainSc: 0-No pain         Complications: No apparent anesthesia complications

## 2018-05-06 NOTE — Anesthesia Postprocedure Evaluation (Signed)
Anesthesia Post Note  Patient: Andrew Larsen  Procedure(s) Performed: right vastus lateralis muscle biopsy (Right )     Patient location during evaluation: PACU Anesthesia Type: MAC Level of consciousness: awake and alert Pain management: pain level controlled Vital Signs Assessment: post-procedure vital signs reviewed and stable Respiratory status: spontaneous breathing, nonlabored ventilation, respiratory function stable and patient connected to nasal cannula oxygen Cardiovascular status: stable and blood pressure returned to baseline Postop Assessment: no apparent nausea or vomiting Anesthetic complications: no    Last Vitals:  Vitals:   05/06/18 0915 05/06/18 0953  BP:  (!) 141/76  Pulse:  (!) 57  Resp:  14  Temp: (!) 36.4 C (!) 36.4 C  SpO2:  100%    Last Pain:  Vitals:   05/06/18 0920  TempSrc:   PainSc: 0-No pain                 Sathvika Ojo P Mulan Adan

## 2018-05-06 NOTE — Anesthesia Procedure Notes (Signed)
Date/Time: 05/06/2018 8:12 AM Performed by: Trinna Post., CRNA Pre-anesthesia Checklist: Patient identified, Emergency Drugs available, Suction available, Patient being monitored and Timeout performed Patient Re-evaluated:Patient Re-evaluated prior to induction Oxygen Delivery Method: Simple face mask Preoxygenation: Pre-oxygenation with 100% oxygen Induction Type: IV induction Placement Confirmation: positive ETCO2

## 2018-05-06 NOTE — H&P (Signed)
Andrew Larsen is an 78 y.o. male.   Chief Complaint: Weakness HPI: 78 year old male with progressive bilateral lower extremity weakness of unclear etiology.  Spinal and vascular work-up negative.  Neurology requests muscle biopsy for further evaluation of disorder.  Past Medical History:  Diagnosis Date  . CKD (chronic kidney disease)    CKD stage III (01/2018)  . Daytime somnolence   . Diabetes mellitus without complication ([place])   . Fracture    right fibula/wears boot cast  . GERD (gastroesophageal reflux disease)   . Glaucoma   . Hypercholesteremia   . Hyperlipidemia   . Hypertension   . Mold exposure   . PAD (peripheral artery disease) ([place])   . Sleep apnea     Past Surgical History:  Procedure Laterality Date  . LOWER EXTREMITY ANGIOGRAPHY N/A 06/12/2016   Procedure: Lower Extremity Angiography;  Surgeon: Adrian Prows, MD;  Location: [facility];  Service: Cardiovascular;  Laterality: N/A;  . ORIF ANKLE FRACTURE Right 10/02/2012   Procedure: OPEN REDUCTION INTERNAL FIXATION (ORIF) ANKLE FRACTURE;  Surgeon: Meredith Pel, MD;  Location: [facility];  Service: Orthopedics;  Laterality: Right;  . PERIPHERAL VASCULAR ATHERECTOMY Left 06/12/2016   Procedure: Peripheral Vascular Atherectomy-Left Popliteal;  Surgeon: Adrian Prows, MD;  Location: [facility] [facility];  Service: Cardiovascular;  Laterality: Left;  . PERIPHERAL VASCULAR INTERVENTION Left 06/12/2016   Procedure: Peripheral Vascular Intervention- DCB Left Popliteal;  Surgeon: Adrian Prows, MD;  Location: [place] [facility];  Service: Cardiovascular;  Laterality: Left;    Family History  Problem Relation Age of Onset  . Heart disease Father   . Prostate cancer Brother   . Cancer Brother   . Diabetes Brother   . Colon cancer Neg Hx    Social History:  reports that he quit smoking about 36 years ago. He has a 20.00 pack-year smoking history. He has never used smokeless tobacco. He reports that he does not drink alcohol or  use drugs.  Allergies:  Allergies  Allergen Reactions  . Metformin And Related   . Simbrinza [Brinzolamide-Brimonidine]     Drainage, redness to eyes    Medications Prior to Admission  Medication Sig Dispense Refill  . aspirin 81 MG chewable tablet Chew 81 mg by mouth at bedtime.     . brimonidine-timolol (COMBIGAN) 0.2-0.5 % ophthalmic solution Place 1 drop into both eyes every 12 (twelve) hours.    . calcium carbonate (CALCIUM 600) 600 MG TABS tablet Take 1,200 mg by mouth daily.     . Cholecalciferol (VITAMIN D3) 5000 units TABS Take 10,000 Units by mouth daily.     . cilostazol (PLETAL) 100 MG tablet Take 100 mg by mouth 2 (two) times daily.    . Evolocumab (REPATHA) 140 MG/ML SOSY Inject 140 mg into the skin every 14 (fourteen) days.    . famotidine (PEPCID) 20 MG tablet Take 20 mg by mouth at bedtime.    . furosemide (LASIX) 40 MG tablet Take 40 mg by mouth daily.    Marland Kitchen gabapentin (NEURONTIN) 300 MG capsule Take 600 mg by mouth 3 (three) times daily.     . insulin NPH-regular Human (NOVOLIN 70/30) (70-30) 100 UNIT/ML injection Inject 30-50 Units into the skin See admin instructions. Inject 50 units subcutaneously in am and 30 units in the evening    . JANUVIA 100 MG tablet Take 100 mg by mouth daily.    Marland Kitchen latanoprost (XALATAN) 0.005 % ophthalmic solution Place 1 drop into both eyes  at bedtime.    . magnesium gluconate (MAGONATE) 500 MG tablet Take 500 mg by mouth daily.    . Multiple Vitamin (MULTIVITAMIN) capsule Take 1 capsule by mouth daily.     Marland Kitchen olmesartan (BENICAR) 20 MG tablet Take 20 mg by mouth daily.    Marland Kitchen omeprazole (PRILOSEC) 40 MG capsule Take 40 mg by mouth 2 (two) times daily.     Marland Kitchen oxybutynin (DITROPAN) 5 MG tablet Take 5 mg by mouth 2 (two) times daily.    . potassium chloride SA (K-DUR,KLOR-CON) 20 MEQ tablet Take 20 mEq by mouth daily.    . tamsulosin (FLOMAX) 0.4 MG CAPS capsule Take 0.4 mg by mouth daily.      Results for orders placed or performed during the  hospital encounter of 05/06/18 (from the past 48 hour(s))  Glucose, capillary     Status: Abnormal   Collection Time: 05/06/18  6:28 AM  Result Value Ref Range   Glucose-Capillary 149 (H) 70 - 99 mg/dL   No results found.  Pertinent items noted in HPI and remainder of comprehensive ROS otherwise negative.  Blood pressure (!) 167/76, pulse 68, temperature 97.9 F (36.6 C), temperature source Oral, resp. rate 16, height 5' 11.5" (1.816 m), weight 117.9 kg, SpO2 99 %.  Patient is awake and alert.  Oriented and appropriate.  Speech is fluent.  Judgment insight are intact.  Cranial nerve function normal bilateral.  Motor examination with diffuse weakness of both upper and lower extremities.  Sensory examination nonfocal.  Reflexes hypoactive.  No evidence of long track signs.  Examination head ears eyes nose throat is unremarked.  Chest and abdomen are obese but otherwise benign.  Extremities are free from injury deformity. Assessment/Plan Myopathy.  Plan right vastus lateralis muscle biopsy.  Risks and benefits of been explained.  Patient wishes to proceed.  Mallie Mussel A Roark Rufo 05/06/2018, 7:51 AM

## 2018-05-06 NOTE — Op Note (Signed)
Date of procedure: 05/06/2018  Date of dictation: Same  Service: Neurosurgery  Preoperative diagnosis: Myopathy  Postoperative diagnosis: Same  Procedure Name: Right vastus lateralis muscle biopsy  Surgeon:Pamelyn Bancroft A.Juergen Hardenbrook, M.D.  Asst. Surgeon: None  Anesthesia: General  Indication: 78 year old male with progressive bilateral lower extremity weakness.  Neurology request muscle biopsy for completion of their work-up.  Operative note: After induction of IV sedation.  Patient right thigh was prepped and draped sterilely.  Lidocaine with epinephrine was infiltrated into the skin edges and fascia.  Incision is made vertically overlying the distal right vastus lateralis.  This carried down sharply through the soft tissue down to the level of the fascia.  The fascia was sharply divided.  Underlying muscle was identified.  This was then grasped sharply with hemostat clamps and excised in an approximate 2 and half centimeter sample.  The sample was then secured to a tongue blade and sent to pathology.  Hemostasis was achieved with the bipolar trocar.  Wounds and closed in layers with Vicryl sutures.  Steri-Strips and a sterile dressing was applied.  No apparent complications.

## 2018-05-06 NOTE — Discharge Instructions (Signed)

## 2018-05-07 ENCOUNTER — Encounter (HOSPITAL_COMMUNITY): Payer: Self-pay | Admitting: Neurosurgery

## 2018-05-23 ENCOUNTER — Encounter: Payer: Self-pay | Admitting: Neurology

## 2018-06-04 DIAGNOSIS — G4733 Obstructive sleep apnea (adult) (pediatric): Secondary | ICD-10-CM | POA: Diagnosis not present

## 2018-06-10 ENCOUNTER — Encounter (HOSPITAL_COMMUNITY): Payer: Self-pay | Admitting: Neurosurgery

## 2018-06-11 ENCOUNTER — Telehealth: Payer: Self-pay | Admitting: Neurology

## 2018-06-11 DIAGNOSIS — G6289 Other specified polyneuropathies: Secondary | ICD-10-CM

## 2018-06-11 DIAGNOSIS — G7249 Other inflammatory and immune myopathies, not elsewhere classified: Secondary | ICD-10-CM

## 2018-06-11 NOTE — Telephone Encounter (Signed)
Please advise patient that I received the pathology report on his muscle biopsy from 05/06/2018. The report says: "There is evidence of inflammatory myopathy and neurogenic atrophy."   In essence, the biopsy results confirm that he has significant peripheral neuropathy which has led to muscle degeneration. In addition, there is evidence of inflammation affecting muscle fibers but not specific enough to make a diagnosis.  I would really like to see about getting a specialist opinion from a neuromuscular specialist at [facility], we could also request neuromuscular specialty at [facility] if it is easier for him to get to one or the other place. If he is agreeable, I would like to make a referral.  Please let me know.

## 2018-06-11 NOTE — Telephone Encounter (Signed)
I called pt to discuss his muscle biopsy results. No answer, left a message asking him to call me back.

## 2018-06-11 NOTE — Addendum Note (Signed)
Addended by: Lester Cascade A on: 06/11/2018 10:34 AM   Modules accepted: Orders

## 2018-06-11 NOTE — Telephone Encounter (Signed)
Pt returned my call. I discussed his muscle biopsy results with him. Pt wants to see a neuromuscular specialist at [facility]. He is asking for that referral to be placed. I reminded him of his appt with Dr. Rexene Alberts in May for his cpap follow up. Pt verbalized understanding of results. Pt had no questions at this time but was encouraged to call back if questions arise.

## 2018-06-12 NOTE — Telephone Encounter (Signed)
Debra faxed the referral. See Referral notes.

## 2018-06-17 DIAGNOSIS — I739 Peripheral vascular disease, unspecified: Secondary | ICD-10-CM | POA: Diagnosis not present

## 2018-06-17 DIAGNOSIS — N189 Chronic kidney disease, unspecified: Secondary | ICD-10-CM | POA: Diagnosis not present

## 2018-06-17 DIAGNOSIS — E7800 Pure hypercholesterolemia, unspecified: Secondary | ICD-10-CM | POA: Diagnosis not present

## 2018-06-17 DIAGNOSIS — E1165 Type 2 diabetes mellitus with hyperglycemia: Secondary | ICD-10-CM | POA: Diagnosis not present

## 2018-06-17 DIAGNOSIS — I10 Essential (primary) hypertension: Secondary | ICD-10-CM | POA: Diagnosis not present

## 2018-06-24 DIAGNOSIS — G4733 Obstructive sleep apnea (adult) (pediatric): Secondary | ICD-10-CM | POA: Diagnosis not present

## 2018-07-03 DIAGNOSIS — G4733 Obstructive sleep apnea (adult) (pediatric): Secondary | ICD-10-CM | POA: Diagnosis not present

## 2018-08-03 DIAGNOSIS — G4733 Obstructive sleep apnea (adult) (pediatric): Secondary | ICD-10-CM | POA: Diagnosis not present

## 2018-08-12 DIAGNOSIS — G4733 Obstructive sleep apnea (adult) (pediatric): Secondary | ICD-10-CM | POA: Diagnosis not present

## 2018-08-12 DIAGNOSIS — E1165 Type 2 diabetes mellitus with hyperglycemia: Secondary | ICD-10-CM | POA: Diagnosis not present

## 2018-08-12 DIAGNOSIS — Z794 Long term (current) use of insulin: Secondary | ICD-10-CM | POA: Diagnosis not present

## 2018-08-12 DIAGNOSIS — D692 Other nonthrombocytopenic purpura: Secondary | ICD-10-CM | POA: Diagnosis not present

## 2018-08-12 DIAGNOSIS — E1151 Type 2 diabetes mellitus with diabetic peripheral angiopathy without gangrene: Secondary | ICD-10-CM | POA: Diagnosis not present

## 2018-08-12 DIAGNOSIS — I129 Hypertensive chronic kidney disease with stage 1 through stage 4 chronic kidney disease, or unspecified chronic kidney disease: Secondary | ICD-10-CM | POA: Diagnosis not present

## 2018-08-12 DIAGNOSIS — G729 Myopathy, unspecified: Secondary | ICD-10-CM | POA: Diagnosis not present

## 2018-08-12 DIAGNOSIS — N183 Chronic kidney disease, stage 3 (moderate): Secondary | ICD-10-CM | POA: Diagnosis not present

## 2018-09-02 DIAGNOSIS — G4733 Obstructive sleep apnea (adult) (pediatric): Secondary | ICD-10-CM | POA: Diagnosis not present

## 2018-09-10 ENCOUNTER — Telehealth: Payer: Self-pay | Admitting: Neurology

## 2018-09-10 NOTE — Telephone Encounter (Signed)
Due to current COVID 19 pandemic, our office is severely reducing in office visits until further notice, in order to minimize the risk to our patients and healthcare providers.   Called patient and offered a virtual visit for his 5/19 appointment. Patient verbalized understanding of the doxy.me process. I have sent patient an e-mail with the doxy link and directions, as well as my name and office number/hours for reference. Patient understands that he will receive a call from RN to update chart.  Pt understands that although there may be some limitations with this type of visit, we will take all precautions to reduce any security or privacy concerns.  Pt understands that this will be treated like an in office visit and we will file with pt's insurance, and there may be a patient responsible charge related to this service.

## 2018-09-15 NOTE — Telephone Encounter (Signed)
I called pt. Pt's meds, allergies, and PMH were updated.  Pt reports that his cpap is going well. He has not seen the specialist at [facility] yet because it got moved until June due to [place].

## 2018-09-15 NOTE — Telephone Encounter (Signed)
I called pt to update his chart prior to his appt tomorrow. He is in the middle of breakfast and will call me back when he is able.

## 2018-09-16 ENCOUNTER — Encounter: Payer: Self-pay | Admitting: Neurology

## 2018-09-16 ENCOUNTER — Other Ambulatory Visit: Payer: Self-pay

## 2018-09-16 ENCOUNTER — Ambulatory Visit (INDEPENDENT_AMBULATORY_CARE_PROVIDER_SITE_OTHER): Payer: Medicare HMO | Admitting: Neurology

## 2018-09-16 DIAGNOSIS — R296 Repeated falls: Secondary | ICD-10-CM | POA: Diagnosis not present

## 2018-09-16 DIAGNOSIS — E0842 Diabetes mellitus due to underlying condition with diabetic polyneuropathy: Secondary | ICD-10-CM | POA: Diagnosis not present

## 2018-09-16 DIAGNOSIS — G4733 Obstructive sleep apnea (adult) (pediatric): Secondary | ICD-10-CM

## 2018-09-16 DIAGNOSIS — Z9989 Dependence on other enabling machines and devices: Secondary | ICD-10-CM

## 2018-09-16 DIAGNOSIS — R29898 Other symptoms and signs involving the musculoskeletal system: Secondary | ICD-10-CM

## 2018-09-16 NOTE — Progress Notes (Signed)
Order for cpap pressure change sent to [facility] via community message. Confirmation received that the order transmitted was successful.  

## 2018-09-16 NOTE — Patient Instructions (Signed)
Given verbally, during today's virtual video-based encounter, with verbal feedback received.   

## 2018-09-16 NOTE — Progress Notes (Signed)
Interim history:  Mr. Andrew Larsen is a 78 -year-old right-handed gentleman with an underlying complex medical history of peripheral vascular disease, hypertension, hyperlipidemia, glaucoma, reflux disease, bilateral ankle fractures with status post surgeries, arthroscopic right knee surgery and status post left femoral stent placement in February 2018, diabetes, chronic cough, prior smoking, and obesity, leg weakness and neuropathy, who presents for a virtual, video based appointment via [email or web address] for follow-up consultation of his obstructive sleep apnea.  The patient is unaccompanied today and joins via cell ph from home, I am located in my office.  I last saw him on 03/18/2018, at which time we talked about his EMG and nerve conduction test results.  We also talked about his sleep study results.  He was compliant with CPAP, AHI was suboptimal around 15/h for his residual sleep disordered breathing.  He had some falls in the interim.  He increased his gabapentin.  He had some elevated blood pressure values.  I suggested we increase his CPAP pressure to 13 cm.  We also proceeded with a referral for muscle biopsy for concern for myopathy. I reviewed his muscle biopsy report in the interim in February 2020 indicating some evidence of possible inflammatory myopathy and neurogenic atrophy.  I suggested we proceed with a specialist referral in neuromuscular division and he agreed to a referral to [facility] for this.  Today, 09/16/2018: Please also see below for virtual visit documentation.   I reviewed his CPAP compliance data from 08/16/2018 through 09/14/2018 which is a total of 30 days, during which time he used his machine every night with percent use days greater than 4 hours at 83.3%, indicating very good compliance with an average usage of 5 hours and 15 minutes, residual AHI suboptimal but improved from before at 10.1/h, leak on the low side, pressure at 13 cm.   The patient's allergies, current medications, family  history, past medical history, past social history, past surgical history and problem list were reviewed and updated as appropriate.   Previously (copied from previous notes for reference):   I first met him on 12/11/2017 at the request of his endocrinologist, and which time he reported weakness in his legs and dizzy spells and recurrent falls. He also endorsed symptoms of sleep apnea with witnessed apneas reported and snoring. I suggested we proceed with an EMG and nerve conduction study as well as sleep testing. EMG and nerve conduction testing from 12/16/2017 showed: IMPRESSION:   Nerve conduction studies done on both lower extremities shows evidence of a primarily axonal peripheral neuropathy of mild to moderate severity.  EMG evaluations on the lower extremities show evidence of mild proximal myopathic changes, distally there appears to be acute denervation with lower motor unit amplitudes but decreased recruitment.  This would suggest some degree of neuropathic denervation distally as well.  If a muscle biopsy is recommended, would suggest the left vastus lateralis muscle.   He was called with his test results and advised to discuss with his PCP the possibility of stopping his cholesterol medication and I suggested we proceed with a muscle biopsy next. He was referred to neurosurgery for this. Level was elevated and he was advised to consider coming off of his cholesterol medication for that reason.   He had a split-night sleep study on 12/22/2017 and I reviewed the results with him in detail today. Baseline sleep efficiency was 83.6%, sleep latency 3.5 minutes, REM latency 93 minutes. His total AHI was highly elevated at 79.4 per hour. He had absence of  slow-wave sleep in the first part of the study. Average oxygen saturation was 98%, nadir was 70%. He had no significant PLMS. He had four bathroom breaks for the night. He was fitted with a full face mask and CPAP was titrated to 11 cm. On the final  pressure his AHI was 4.3 per hour with nonsupine REM sleep achieved and O2 nadir 87%. He had severe PLMS during the second part of the study with no significant arousals. He had absence of slow-wave sleep and a REM percentage of 25.6 during the treatment portion of the study. I suggested a home treatment pressure of 12 cm.   I reviewed his CPAP compliance data from 02/15/2018 through 03/16/2018 which is a total of 30 days, during which time he used his CPAP 28 days with percent used days greater than 4 hours at 86.7%, indicating very good compliance with an average usage of 5 hours and 24 minutes, residual AHI elevated at 15.8 per hour, leak on the low side, CPAP pressure of 12 cm.   12/11/2017: (He) reports intermittent dizzy spells but more often he feels weaker in his legs particularly in his thigh muscles. He has fallen. He denies a spinning sensation. He reports pain in both groin areas. Of note, he has arthritis problems and ongoing issues with his right knee as well. He was told that he is hyperextending his right knee. He had arthroscopic surgery to the knee and had surgeries to both ankles after fractures. He has gained weight. I reviewed your office note from 11/06/2017, which you kindly included. His A1c from December 2018 was 7.0. Most recent A1c was 6 per your office note. He had a sleep study many years ago but is not sure what it showed. In 2017 he was referred to me for sleep evaluation but did not decide to go through with the consultation. He would be willing to get tested for sleep apnea. He does not sleep well. He snores and has witnessed apneas. He is widowed and lives with his only daughter. He quit smoking in the 80s and does not utilize alcohol on a regular basis, drinks caffeine in the form of coffee, 3-4 times per week, not daily. His brother may have sleep apnea. He has occasional morning headaches and reports nocturia about 3 times per average night. He has significant lower extremity  swelling. He takes Lasix as needed for this. He reports tingling and burning sensation in both feet, particularly on the bottom of both feet, he carries a diagnosis of diabetic neuropathy.   His Past Medical History Is Significant For: Past Medical History:  Diagnosis Date   CKD (chronic kidney disease)    CKD stage III (01/2018)   Daytime somnolence    Diabetes mellitus without complication (HCC)    Fracture    right fibula/wears boot cast   GERD (gastroesophageal reflux disease)    Glaucoma    Hypercholesteremia    Hyperlipidemia    Hypertension    Mold exposure    PAD (peripheral artery disease) ([place])    Sleep apnea     His Past Surgical History Is Significant For: Past Surgical History:  Procedure Laterality Date   LOWER EXTREMITY ANGIOGRAPHY N/A 06/12/2016   Procedure: Lower Extremity Angiography;  Surgeon: Adrian Prows, MD;  Location: [place] [facility];  Service: Cardiovascular;  Laterality: N/A;   MUSCLE BIOPSY Right 05/06/2018   Procedure: right vastus lateralis muscle biopsy;  Surgeon: Earnie Larsson, MD;  Location: [place];  Service: Neurosurgery;  Laterality: Right;   ORIF ANKLE FRACTURE Right 10/02/2012   Procedure: OPEN REDUCTION INTERNAL FIXATION (ORIF) ANKLE FRACTURE;  Surgeon: Meredith Pel, MD;  Location: [facility];  Service: Orthopedics;  Laterality: Right;   PERIPHERAL VASCULAR ATHERECTOMY Left 06/12/2016   Procedure: Peripheral Vascular Atherectomy-Left Popliteal;  Surgeon: Adrian Prows, MD;  Location: [place] [facility];  Service: Cardiovascular;  Laterality: Left;   PERIPHERAL VASCULAR INTERVENTION Left 06/12/2016   Procedure: Peripheral Vascular Intervention- DCB Left Popliteal;  Surgeon: Adrian Prows, MD;  Location: [place] [facility];  Service: Cardiovascular;  Laterality: Left;    His Family History Is Significant For: Family History  Problem Relation Age of Onset   Heart disease Father    Prostate cancer Brother    Cancer Brother    Diabetes  Brother    Colon cancer Neg Hx     His Social History Is Significant For: Social History   Socioeconomic History   Marital status: Married    Spouse name: Not on file   Number of children: Not on file   Years of education: Not on file   Highest education level: Not on file  Occupational History   Not on file  Social Needs   Financial resource strain: Not on file   Food insecurity:    Worry: Not on file    Inability: Not on file   Transportation needs:    Medical: Not on file    Non-medical: Not on file  Tobacco Use   Smoking status: Former Smoker    Packs/day: 1.00    Years: 20.00    Pack years: 20.00    Last attempt to quit: 08/22/1981    Years since quitting: 37.0   Smokeless tobacco: Never Used  Substance and Sexual Activity   Alcohol use: No    Alcohol/week: 0.0 standard drinks   Drug use: No   Sexual activity: Never  Lifestyle   Physical activity:    Days per week: Not on file    Minutes per session: Not on file   Stress: Not on file  Relationships   Social connections:    Talks on phone: Not on file    Gets together: Not on file    Attends religious service: Not on file    Active member of club or organization: Not on file    Attends meetings of clubs or organizations: Not on file    Relationship status: Not on file  Other Topics Concern   Not on file  Social History Narrative   ** Merged History Encounter **        His Allergies Are:  Allergies  Allergen Reactions   Metformin And Related    Simbrinza [Brinzolamide-Brimonidine]     Drainage, redness to eyes  :   His Current Medications Are:  Outpatient Encounter Medications as of 09/16/2018  Medication Sig   aspirin 81 MG chewable tablet Chew 81 mg by mouth at bedtime.    brimonidine-timolol (COMBIGAN) 0.2-0.5 % ophthalmic solution Place 1 drop into both eyes every 12 (twelve) hours.   calcium carbonate (CALCIUM 600) 600 MG TABS tablet Take 1,200 mg by mouth daily.     Cholecalciferol (VITAMIN D3) 5000 units TABS Take 10,000 Units by mouth daily.    cilostazol (PLETAL) 100 MG tablet Take 100 mg by mouth 2 (two) times daily.   Evolocumab (REPATHA) 140 MG/ML SOSY Inject 140 mg into the skin every 14 (fourteen) days.   famotidine (PEPCID) 20 MG tablet Take 20 mg  by mouth at bedtime.   furosemide (LASIX) 40 MG tablet Take 40 mg by mouth daily.   gabapentin (NEURONTIN) 300 MG capsule Take 600 mg by mouth 3 (three) times daily.    HYDROcodone-acetaminophen (NORCO/VICODIN) 5-325 MG tablet Take 1 tablet by mouth every 4 (four) hours as needed for moderate pain.   insulin NPH-regular Human (NOVOLIN 70/30) (70-30) 100 UNIT/ML injection Inject 30-50 Units into the skin See admin instructions. Inject 50 units subcutaneously in am and 30 units in the evening   JANUVIA 100 MG tablet Take 100 mg by mouth daily.   latanoprost (XALATAN) 0.005 % ophthalmic solution Place 1 drop into both eyes at bedtime.   magnesium gluconate (MAGONATE) 500 MG tablet Take 500 mg by mouth daily.   Multiple Vitamin (MULTIVITAMIN) capsule Take 1 capsule by mouth daily.    olmesartan (BENICAR) 20 MG tablet Take 20 mg by mouth daily.   omeprazole (PRILOSEC) 40 MG capsule Take 40 mg by mouth 2 (two) times daily.    oxybutynin (DITROPAN) 5 MG tablet Take 5 mg by mouth 2 (two) times daily.   potassium chloride SA (K-DUR,KLOR-CON) 20 MEQ tablet Take 20 mEq by mouth daily.   tamsulosin (FLOMAX) 0.4 MG CAPS capsule Take 0.4 mg by mouth daily.   No facility-administered encounter medications on file as of 09/16/2018.   :  Review of Systems:  Out of a complete 14 point review of systems, all are reviewed and negative with the exception of these symptoms as listed below:  Virtual Visit via Video Note on @ [facility]  I connected with@ on 09/16/18 at 11:30 AM EDT by a video enabled telemedicine application and verified that I am speaking with the correct person using two identifiers.   I  discussed the limitations of evaluation and management by telemedicine and the availability of in person appointments. The patient expressed understanding and agreed to proceed.  History of Present Illness: He reports Feeling fairly stable, however, he does endorse having had a couple of falls since his last visit.  He believes his balance has become worse.  He had a follow-up with his primary care physician virtually recently and everything in terms of his meds were kept the same, no new labs were done.  He has an eye check every 3 months because of his glaucoma, findings are stable per his report.  He did not hurt himself thankfully but does report some soreness in the right shoulder and right hip area.  He has a cane.  His appointment at [facility] was delayed because of the COVID-19 pandemic but he believes he has an appointment sometime in June now.  He has adapted fairly well to CPAP, does not complain of mouth dryness, is motivated to continue with treatment, would be willing to try a slightly higher pressure.  He does use the ramp time, which is 20 minutes, starts at 11 cm.  He does still need to get up to use the bathroom at night.  He believes that CPAP has been helpful overall.  He uses a fullface mask.   Observations/Objective: The most recent vital signs available for my review in his chart are from 05/06/2018: Blood pressure 141/76, pulse 57, temperature 97.5, weight 260 pounds for a BMI of 35.76. On examination, he is very pleasant and conversant, in no acute distress.  Face is symmetric with normal facial animation, normal eye blink rate, extraocular movements well preserved in all directions without nystagmus noted.  He wears corrective eyeglasses.  Hearing is  grossly intact.  Comprehension skills and language good, speech is clear without dysarthria, hypophonia or voice tremor.  He has no lip, neck or head tremor noticeable.  Shoulder shrug is possible bilaterally.  Upper body mobility preserved,  coordination preserved in the upper extremities.  Assessment and Plan: In summary,Kori L Grahamis a very pleasant 52 year oldmalewith an underlying complex medical history of peripheral vascular disease, hypertension, hyperlipidemia, glaucoma, reflux disease, bilateral ankle fractures with status post surgeries, arthroscopic right knee surgery and status post left femoral stent placement in February 2018, diabetes, chronic cough, prior smoking, and obesity, whopresents for A virtual, video based appointment via [email or web address] for follow-up consultation of his obstructive sleep apnea, also history of neuropathy, and muscle weakness.  He feels stable overall.  He has been compliant with his CPAP and is commended for this.  I had made a referral to a neuromuscular specialist at [facility] and appointment is pending, he believes for sometime in June.  It had to be delayed because of the COVID-19 pandemic.  He is in close follow-up with his eye doctor and had a recent virtual visit with his primary care physician.  We increased his CPAP pressure to 13 cm in November 2019.  He has better apnea control and can tolerate the pressure, would be willing to try a slightly higher pressure at 14 cm at this time.  I will make those changes.  He is advised to call our office for an update in about 6 weeks so I can review another download remotely on the new setting, he has a 20-minute ramp time and ramp starts at 11 cm, he would like to keep this the same.  We talked about balance problems and fall prevention, he has fallen a few times.  Thankfully he reports no injuries but has had some sore areas in the shoulder and hip on the R. Of note, he had a split-night sleep study on August 2019 which confirmed severe obstructive sleep apnea. He has done well with CPAP therapy, he is compliant with treatment and noticed an improvement in his sleep consolidation and sleep quality, and particularly in his nocturia. He is motivated to continue.  I would like to increase the pressure to 14 cm for optimization of his obstructive sleep apnea treatment. He is willing to do this. He had an elevated CK level, EMG and nerve conduction testing indicative of diabetic neuropathy but also underlying possible myopathy. Muscle biopsy also indicated possible inflammatory myopathy in addition to neurogenic atrophy. I proceeded with a referral to neuromuscular specialty at [facility]. We will see him back in follow-up routinely in 6 months. I answered all his questions today and he was in agreement.  Follow Up Instructions:    I discussed the assessment and treatment plan with the patient. The patient was provided an opportunity to ask questions and all were answered. The patient agreed with the plan and demonstrated an understanding of the instructions.   The patient was advised to call back or seek an in-person evaluation if the symptoms worsen or if the condition fails to improve as anticipated.   I provided 15 minutes of non-face-to-face time during this encounter.   Star Age, MD

## 2018-09-17 ENCOUNTER — Telehealth: Payer: Self-pay

## 2018-09-17 NOTE — Telephone Encounter (Signed)
I called pt, scheduled his 6 mo follow up with MM. Pt verbalized understanding of new appt date and time.

## 2018-10-03 DIAGNOSIS — G4733 Obstructive sleep apnea (adult) (pediatric): Secondary | ICD-10-CM | POA: Diagnosis not present

## 2018-10-15 DIAGNOSIS — E119 Type 2 diabetes mellitus without complications: Secondary | ICD-10-CM | POA: Diagnosis not present

## 2018-10-15 DIAGNOSIS — G608 Other hereditary and idiopathic neuropathies: Secondary | ICD-10-CM | POA: Diagnosis not present

## 2018-10-15 DIAGNOSIS — R600 Localized edema: Secondary | ICD-10-CM | POA: Diagnosis not present

## 2018-10-15 DIAGNOSIS — G729 Myopathy, unspecified: Secondary | ICD-10-CM | POA: Diagnosis not present

## 2018-10-15 DIAGNOSIS — R531 Weakness: Secondary | ICD-10-CM | POA: Diagnosis not present

## 2018-10-15 DIAGNOSIS — Z79899 Other long term (current) drug therapy: Secondary | ICD-10-CM | POA: Diagnosis not present

## 2018-10-15 DIAGNOSIS — Z87891 Personal history of nicotine dependence: Secondary | ICD-10-CM | POA: Diagnosis not present

## 2018-10-15 DIAGNOSIS — Z794 Long term (current) use of insulin: Secondary | ICD-10-CM | POA: Diagnosis not present

## 2018-10-15 DIAGNOSIS — G603 Idiopathic progressive neuropathy: Secondary | ICD-10-CM | POA: Diagnosis not present

## 2018-10-16 DIAGNOSIS — E7800 Pure hypercholesterolemia, unspecified: Secondary | ICD-10-CM | POA: Diagnosis not present

## 2018-10-16 DIAGNOSIS — I739 Peripheral vascular disease, unspecified: Secondary | ICD-10-CM | POA: Diagnosis not present

## 2018-10-16 DIAGNOSIS — N189 Chronic kidney disease, unspecified: Secondary | ICD-10-CM | POA: Diagnosis not present

## 2018-10-16 DIAGNOSIS — E1165 Type 2 diabetes mellitus with hyperglycemia: Secondary | ICD-10-CM | POA: Diagnosis not present

## 2018-10-16 DIAGNOSIS — I10 Essential (primary) hypertension: Secondary | ICD-10-CM | POA: Diagnosis not present

## 2018-10-23 DIAGNOSIS — G4733 Obstructive sleep apnea (adult) (pediatric): Secondary | ICD-10-CM | POA: Diagnosis not present

## 2018-10-28 DIAGNOSIS — Z1159 Encounter for screening for other viral diseases: Secondary | ICD-10-CM | POA: Diagnosis not present

## 2018-11-02 DIAGNOSIS — G4733 Obstructive sleep apnea (adult) (pediatric): Secondary | ICD-10-CM | POA: Diagnosis not present

## 2018-11-03 ENCOUNTER — Inpatient Hospital Stay (HOSPITAL_COMMUNITY)
Admission: EM | Admit: 2018-11-03 | Discharge: 2018-12-01 | DRG: 177 | Disposition: A | Discharge status: Rehabilitation Facility | Payer: Medicare HMO | Attending: Internal Medicine | Admitting: Internal Medicine

## 2018-11-03 ENCOUNTER — Encounter (HOSPITAL_COMMUNITY): Payer: Self-pay

## 2018-11-03 ENCOUNTER — Other Ambulatory Visit: Payer: Self-pay

## 2018-11-03 ENCOUNTER — Emergency Department (HOSPITAL_COMMUNITY): Payer: Medicare HMO

## 2018-11-03 DIAGNOSIS — J90 Pleural effusion, not elsewhere classified: Secondary | ICD-10-CM | POA: Diagnosis not present

## 2018-11-03 DIAGNOSIS — J9601 Acute respiratory failure with hypoxia: Secondary | ICD-10-CM | POA: Diagnosis not present

## 2018-11-03 DIAGNOSIS — Z794 Long term (current) use of insulin: Secondary | ICD-10-CM | POA: Diagnosis not present

## 2018-11-03 DIAGNOSIS — Z833 Family history of diabetes mellitus: Secondary | ICD-10-CM | POA: Diagnosis not present

## 2018-11-03 DIAGNOSIS — K219 Gastro-esophageal reflux disease without esophagitis: Secondary | ICD-10-CM | POA: Diagnosis present

## 2018-11-03 DIAGNOSIS — R0902 Hypoxemia: Secondary | ICD-10-CM

## 2018-11-03 DIAGNOSIS — D72819 Decreased white blood cell count, unspecified: Secondary | ICD-10-CM | POA: Diagnosis not present

## 2018-11-03 DIAGNOSIS — E669 Obesity, unspecified: Secondary | ICD-10-CM | POA: Diagnosis not present

## 2018-11-03 DIAGNOSIS — J1289 Other viral pneumonia: Secondary | ICD-10-CM | POA: Diagnosis not present

## 2018-11-03 DIAGNOSIS — D696 Thrombocytopenia, unspecified: Secondary | ICD-10-CM | POA: Diagnosis not present

## 2018-11-03 DIAGNOSIS — M7989 Other specified soft tissue disorders: Secondary | ICD-10-CM | POA: Diagnosis not present

## 2018-11-03 DIAGNOSIS — Z79899 Other long term (current) drug therapy: Secondary | ICD-10-CM

## 2018-11-03 DIAGNOSIS — L8990 Pressure ulcer of unspecified site, unspecified stage: Secondary | ICD-10-CM | POA: Insufficient documentation

## 2018-11-03 DIAGNOSIS — G4733 Obstructive sleep apnea (adult) (pediatric): Secondary | ICD-10-CM | POA: Diagnosis present

## 2018-11-03 DIAGNOSIS — G7241 Inclusion body myositis [IBM]: Secondary | ICD-10-CM | POA: Diagnosis not present

## 2018-11-03 DIAGNOSIS — E785 Hyperlipidemia, unspecified: Secondary | ICD-10-CM | POA: Diagnosis present

## 2018-11-03 DIAGNOSIS — B948 Sequelae of other specified infectious and parasitic diseases: Secondary | ICD-10-CM | POA: Diagnosis not present

## 2018-11-03 DIAGNOSIS — N400 Enlarged prostate without lower urinary tract symptoms: Secondary | ICD-10-CM | POA: Diagnosis present

## 2018-11-03 DIAGNOSIS — Z87891 Personal history of nicotine dependence: Secondary | ICD-10-CM

## 2018-11-03 DIAGNOSIS — R000 Tachycardia, unspecified: Secondary | ICD-10-CM | POA: Diagnosis not present

## 2018-11-03 DIAGNOSIS — E118 Type 2 diabetes mellitus with unspecified complications: Secondary | ICD-10-CM | POA: Diagnosis not present

## 2018-11-03 DIAGNOSIS — J988 Other specified respiratory disorders: Secondary | ICD-10-CM | POA: Diagnosis not present

## 2018-11-03 DIAGNOSIS — E1140 Type 2 diabetes mellitus with diabetic neuropathy, unspecified: Secondary | ICD-10-CM | POA: Diagnosis present

## 2018-11-03 DIAGNOSIS — E11649 Type 2 diabetes mellitus with hypoglycemia without coma: Secondary | ICD-10-CM | POA: Diagnosis not present

## 2018-11-03 DIAGNOSIS — E871 Hypo-osmolality and hyponatremia: Secondary | ICD-10-CM | POA: Diagnosis not present

## 2018-11-03 DIAGNOSIS — R040 Epistaxis: Secondary | ICD-10-CM | POA: Diagnosis not present

## 2018-11-03 DIAGNOSIS — Z978 Presence of other specified devices: Secondary | ICD-10-CM | POA: Diagnosis not present

## 2018-11-03 DIAGNOSIS — E1169 Type 2 diabetes mellitus with other specified complication: Secondary | ICD-10-CM | POA: Diagnosis not present

## 2018-11-03 DIAGNOSIS — E1122 Type 2 diabetes mellitus with diabetic chronic kidney disease: Secondary | ICD-10-CM | POA: Diagnosis present

## 2018-11-03 DIAGNOSIS — I130 Hypertensive heart and chronic kidney disease with heart failure and stage 1 through stage 4 chronic kidney disease, or unspecified chronic kidney disease: Secondary | ICD-10-CM | POA: Diagnosis present

## 2018-11-03 DIAGNOSIS — N183 Chronic kidney disease, stage 3 unspecified: Secondary | ICD-10-CM

## 2018-11-03 DIAGNOSIS — R5381 Other malaise: Secondary | ICD-10-CM | POA: Diagnosis not present

## 2018-11-03 DIAGNOSIS — L89152 Pressure ulcer of sacral region, stage 2: Secondary | ICD-10-CM | POA: Diagnosis not present

## 2018-11-03 DIAGNOSIS — I10 Essential (primary) hypertension: Secondary | ICD-10-CM | POA: Diagnosis not present

## 2018-11-03 DIAGNOSIS — I739 Peripheral vascular disease, unspecified: Secondary | ICD-10-CM | POA: Diagnosis not present

## 2018-11-03 DIAGNOSIS — R7309 Other abnormal glucose: Secondary | ICD-10-CM | POA: Diagnosis not present

## 2018-11-03 DIAGNOSIS — Z6835 Body mass index (BMI) 35.0-35.9, adult: Secondary | ICD-10-CM

## 2018-11-03 DIAGNOSIS — I5032 Chronic diastolic (congestive) heart failure: Secondary | ICD-10-CM | POA: Diagnosis not present

## 2018-11-03 DIAGNOSIS — R05 Cough: Secondary | ICD-10-CM

## 2018-11-03 DIAGNOSIS — J069 Acute upper respiratory infection, unspecified: Secondary | ICD-10-CM

## 2018-11-03 DIAGNOSIS — U071 COVID-19: Principal | ICD-10-CM | POA: Diagnosis present

## 2018-11-03 DIAGNOSIS — E875 Hyperkalemia: Secondary | ICD-10-CM | POA: Diagnosis not present

## 2018-11-03 DIAGNOSIS — Z7982 Long term (current) use of aspirin: Secondary | ICD-10-CM | POA: Diagnosis not present

## 2018-11-03 DIAGNOSIS — I517 Cardiomegaly: Secondary | ICD-10-CM | POA: Diagnosis not present

## 2018-11-03 DIAGNOSIS — R918 Other nonspecific abnormal finding of lung field: Secondary | ICD-10-CM | POA: Diagnosis not present

## 2018-11-03 DIAGNOSIS — E1151 Type 2 diabetes mellitus with diabetic peripheral angiopathy without gangrene: Secondary | ICD-10-CM | POA: Diagnosis present

## 2018-11-03 DIAGNOSIS — Z8249 Family history of ischemic heart disease and other diseases of the circulatory system: Secondary | ICD-10-CM | POA: Diagnosis not present

## 2018-11-03 DIAGNOSIS — E119 Type 2 diabetes mellitus without complications: Secondary | ICD-10-CM

## 2018-11-03 DIAGNOSIS — I2510 Atherosclerotic heart disease of native coronary artery without angina pectoris: Secondary | ICD-10-CM | POA: Diagnosis present

## 2018-11-03 DIAGNOSIS — N1832 Chronic kidney disease, stage 3b: Secondary | ICD-10-CM | POA: Diagnosis present

## 2018-11-03 DIAGNOSIS — E46 Unspecified protein-calorie malnutrition: Secondary | ICD-10-CM | POA: Diagnosis not present

## 2018-11-03 DIAGNOSIS — E7800 Pure hypercholesterolemia, unspecified: Secondary | ICD-10-CM | POA: Diagnosis present

## 2018-11-03 DIAGNOSIS — G4730 Sleep apnea, unspecified: Secondary | ICD-10-CM | POA: Diagnosis present

## 2018-11-03 DIAGNOSIS — R0602 Shortness of breath: Secondary | ICD-10-CM | POA: Diagnosis not present

## 2018-11-03 DIAGNOSIS — J984 Other disorders of lung: Secondary | ICD-10-CM

## 2018-11-03 DIAGNOSIS — Z452 Encounter for adjustment and management of vascular access device: Secondary | ICD-10-CM

## 2018-11-03 DIAGNOSIS — Z9981 Dependence on supplemental oxygen: Secondary | ICD-10-CM | POA: Diagnosis not present

## 2018-11-03 DIAGNOSIS — E1100 Type 2 diabetes mellitus with hyperosmolarity without nonketotic hyperglycemic-hyperosmolar coma (NKHHC): Secondary | ICD-10-CM | POA: Diagnosis not present

## 2018-11-03 DIAGNOSIS — I129 Hypertensive chronic kidney disease with stage 1 through stage 4 chronic kidney disease, or unspecified chronic kidney disease: Secondary | ICD-10-CM | POA: Diagnosis not present

## 2018-11-03 DIAGNOSIS — R350 Frequency of micturition: Secondary | ICD-10-CM | POA: Diagnosis not present

## 2018-11-03 DIAGNOSIS — E0841 Diabetes mellitus due to underlying condition with diabetic mononeuropathy: Secondary | ICD-10-CM | POA: Diagnosis not present

## 2018-11-03 DIAGNOSIS — J9611 Chronic respiratory failure with hypoxia: Secondary | ICD-10-CM | POA: Diagnosis not present

## 2018-11-03 DIAGNOSIS — R059 Cough, unspecified: Secondary | ICD-10-CM

## 2018-11-03 DIAGNOSIS — R609 Edema, unspecified: Secondary | ICD-10-CM | POA: Diagnosis not present

## 2018-11-03 DIAGNOSIS — E1165 Type 2 diabetes mellitus with hyperglycemia: Secondary | ICD-10-CM | POA: Diagnosis not present

## 2018-11-03 LAB — COMPREHENSIVE METABOLIC PANEL
ALT: 42 U/L (ref 0–44)
AST: 55 U/L — ABNORMAL HIGH (ref 15–41)
Albumin: 3.5 g/dL (ref 3.5–5.0)
Alkaline Phosphatase: 77 U/L (ref 38–126)
Anion gap: 8 (ref 5–15)
BUN: 23 mg/dL (ref 8–23)
CO2: 27 mmol/L (ref 22–32)
Calcium: 8.3 mg/dL — ABNORMAL LOW (ref 8.9–10.3)
Chloride: 100 mmol/L (ref 98–111)
Creatinine, Ser: 1.78 mg/dL — ABNORMAL HIGH (ref 0.61–1.24)
GFR calc Af Amer: 42 mL/min — ABNORMAL LOW (ref >60)
GFR calc non Af Amer: 36 mL/min — ABNORMAL LOW (ref >60)
Glucose, Bld: 111 mg/dL — ABNORMAL HIGH (ref 70–99)
Potassium: 4.2 mmol/L (ref 3.5–5.1)
Sodium: 135 mmol/L (ref 135–145)
Total Bilirubin: 0.4 mg/dL (ref 0.3–1.2)
Total Protein: 6.7 g/dL (ref 6.5–8.1)

## 2018-11-03 LAB — CBC WITH DIFFERENTIAL/PLATELET
Abs Immature Granulocytes: 0 10*3/uL (ref 0.00–0.07)
Basophils Absolute: 0 10*3/uL (ref 0.0–0.1)
Basophils Relative: 0 %
Eosinophils Absolute: 0.1 10*3/uL (ref 0.0–0.5)
Eosinophils Relative: 1 %
HCT: 42.1 % (ref 39.0–52.0)
Hemoglobin: 13.8 g/dL (ref 13.0–17.0)
Immature Granulocytes: 0 %
Lymphocytes Relative: 27 %
Lymphs Abs: 1.3 10*3/uL (ref 0.7–4.0)
MCH: 30.6 pg (ref 26.0–34.0)
MCHC: 32.8 g/dL (ref 30.0–36.0)
MCV: 93.3 fL (ref 80.0–100.0)
Monocytes Absolute: 0.7 10*3/uL (ref 0.1–1.0)
Monocytes Relative: 14 %
Neutro Abs: 2.8 10*3/uL (ref 1.7–7.7)
Neutrophils Relative %: 58 %
Platelets: 161 10*3/uL (ref 150–400)
RBC: 4.51 MIL/uL (ref 4.22–5.81)
RDW: 13.8 % (ref 11.5–15.5)
WBC: 4.8 10*3/uL (ref 4.0–10.5)
nRBC: 0 % (ref 0.0–0.2)

## 2018-11-03 LAB — D-DIMER, QUANTITATIVE: D-Dimer, Quant: 0.69 ug/mL-FEU — ABNORMAL HIGH (ref 0.00–0.50)

## 2018-11-03 LAB — CBG MONITORING, ED: Glucose-Capillary: 105 mg/dL — ABNORMAL HIGH (ref 70–99)

## 2018-11-03 LAB — FIBRINOGEN: Fibrinogen: 475 mg/dL (ref 210–475)

## 2018-11-03 LAB — GLUCOSE, CAPILLARY: Glucose-Capillary: 129 mg/dL — ABNORMAL HIGH (ref 70–99)

## 2018-11-03 LAB — FERRITIN: Ferritin: 222 ng/mL (ref 24–336)

## 2018-11-03 LAB — SARS CORONAVIRUS 2 BY RT PCR (HOSPITAL ORDER, PERFORMED IN ~~LOC~~ HOSPITAL LAB): SARS Coronavirus 2: POSITIVE — AB

## 2018-11-03 LAB — LACTATE DEHYDROGENASE: LDH: 335 U/L — ABNORMAL HIGH (ref 98–192)

## 2018-11-03 LAB — TRIGLYCERIDES: Triglycerides: 87 mg/dL (ref <150)

## 2018-11-03 LAB — LACTIC ACID, PLASMA: Lactic Acid, Venous: 0.7 mmol/L (ref 0.5–1.9)

## 2018-11-03 LAB — PROCALCITONIN: Procalcitonin: <0.1 ng/mL

## 2018-11-03 LAB — C-REACTIVE PROTEIN: CRP: 10.3 mg/dL — ABNORMAL HIGH (ref <1.0)

## 2018-11-03 MED ORDER — ONDANSETRON HCL 4 MG/2ML IJ SOLN: 4.0000 mg | Freq: Four times a day (QID) | INTRAMUSCULAR | Status: DC | PRN

## 2018-11-03 MED ORDER — SODIUM CHLORIDE 0.9% FLUSH
3.0000 mL | Freq: Two times a day (BID) | INTRAVENOUS | Status: DC
  Administered 2018-11-04 – 2018-11-07 (×8): 3 mL via INTRAVENOUS
  Administered 2018-11-08: 10 mL via INTRAVENOUS
  Administered 2018-11-08 – 2018-11-27 (×34): 3 mL via INTRAVENOUS

## 2018-11-03 MED ORDER — ONDANSETRON HCL 4 MG PO TABS: 4.0000 mg | ORAL_TABLET | Freq: Four times a day (QID) | ORAL | Status: DC | PRN

## 2018-11-03 MED ORDER — BRIMONIDINE TARTRATE-TIMOLOL 0.2-0.5 % OP SOLN: 1.0000 [drp] | Freq: Two times a day (BID) | OPHTHALMIC | Status: DC

## 2018-11-03 MED ORDER — OXYBUTYNIN CHLORIDE 5 MG PO TABS
5.0000 mg | ORAL_TABLET | Freq: Two times a day (BID) | ORAL | Status: DC
  Administered 2018-11-04 – 2018-12-01 (×54): 5 mg via ORAL
  Filled 2018-11-03 (×61): qty 1

## 2018-11-03 MED ORDER — IRBESARTAN 150 MG PO TABS
150.0000 mg | ORAL_TABLET | Freq: Every day | ORAL | Status: DC
  Administered 2018-11-04 – 2018-11-07 (×4): 150 mg via ORAL
  Filled 2018-11-03 (×5): qty 1

## 2018-11-03 MED ORDER — LATANOPROST 0.005 % OP SOLN
1.0000 [drp] | Freq: Every day | OPHTHALMIC | Status: DC
  Administered 2018-11-03 – 2018-11-30 (×28): 1 [drp] via OPHTHALMIC
  Filled 2018-11-03 (×2): qty 2.5

## 2018-11-03 MED ORDER — TAMSULOSIN HCL 0.4 MG PO CAPS
0.4000 mg | ORAL_CAPSULE | Freq: Every day | ORAL | Status: DC
  Administered 2018-11-04 – 2018-12-01 (×28): 0.4 mg via ORAL
  Filled 2018-11-03 (×29): qty 1

## 2018-11-03 MED ORDER — ASPIRIN 81 MG PO CHEW
81.0000 mg | CHEWABLE_TABLET | Freq: Every day | ORAL | Status: DC
  Administered 2018-11-03 – 2018-11-30 (×28): 81 mg via ORAL
  Filled 2018-11-03 (×28): qty 1

## 2018-11-03 MED ORDER — CILOSTAZOL 100 MG PO TABS
100.0000 mg | ORAL_TABLET | Freq: Two times a day (BID) | ORAL | Status: DC
  Administered 2018-11-03 – 2018-12-01 (×55): 100 mg via ORAL
  Filled 2018-11-03 (×60): qty 1

## 2018-11-03 MED ORDER — BRIMONIDINE TARTRATE 0.2 % OP SOLN
1.0000 [drp] | Freq: Two times a day (BID) | OPHTHALMIC | Status: DC
  Administered 2018-11-03 – 2018-11-07 (×8): 1 [drp] via OPHTHALMIC
  Filled 2018-11-03 (×3): qty 5

## 2018-11-03 MED ORDER — FUROSEMIDE 40 MG PO TABS
40.0000 mg | ORAL_TABLET | Freq: Every day | ORAL | Status: DC
  Administered 2018-11-04 – 2018-11-07 (×4): 40 mg via ORAL
  Filled 2018-11-03: qty 2
  Filled 2018-11-03: qty 1
  Filled 2018-11-03 (×3): qty 2

## 2018-11-03 MED ORDER — TIMOLOL MALEATE 0.5 % OP SOLN
1.0000 [drp] | Freq: Two times a day (BID) | OPHTHALMIC | Status: DC
  Administered 2018-11-03 – 2018-12-01 (×54): 1 [drp] via OPHTHALMIC
  Filled 2018-11-03 (×2): qty 5

## 2018-11-03 MED ORDER — FAMOTIDINE 20 MG PO TABS
20.0000 mg | ORAL_TABLET | Freq: Every day | ORAL | Status: DC
  Administered 2018-11-03 – 2018-11-30 (×28): 20 mg via ORAL
  Filled 2018-11-03 (×29): qty 1

## 2018-11-03 MED ORDER — INSULIN GLARGINE 100 UNIT/ML ~~LOC~~ SOLN
30.0000 [IU] | Freq: Every day | SUBCUTANEOUS | Status: DC
  Administered 2018-11-03 – 2018-11-06 (×4): 30 [IU] via SUBCUTANEOUS
  Filled 2018-11-03 (×4): qty 0.3

## 2018-11-03 MED ORDER — GABAPENTIN 300 MG PO CAPS
600.0000 mg | ORAL_CAPSULE | Freq: Three times a day (TID) | ORAL | Status: DC
  Administered 2018-11-03 – 2018-12-01 (×83): 600 mg via ORAL
  Filled 2018-11-03 (×84): qty 2

## 2018-11-03 MED ORDER — INSULIN ASPART 100 UNIT/ML ~~LOC~~ SOLN
0.0000 [IU] | Freq: Three times a day (TID) | SUBCUTANEOUS | Status: DC
  Administered 2018-11-04 – 2018-11-05 (×4): 2 [IU] via SUBCUTANEOUS
  Administered 2018-11-06: 9 [IU] via SUBCUTANEOUS
  Administered 2018-11-06: 7 [IU] via SUBCUTANEOUS

## 2018-11-03 MED ORDER — HEPARIN SODIUM (PORCINE) 5000 UNIT/ML IJ SOLN
5000.0000 [IU] | Freq: Three times a day (TID) | INTRAMUSCULAR | Status: DC
  Administered 2018-11-03 – 2018-11-08 (×14): 5000 [IU] via SUBCUTANEOUS
  Filled 2018-11-03 (×13): qty 1

## 2018-11-03 MED ORDER — INSULIN ASPART 100 UNIT/ML ~~LOC~~ SOLN
0.0000 [IU] | Freq: Every day | SUBCUTANEOUS | Status: DC
  Administered 2018-11-05: 2 [IU] via SUBCUTANEOUS
  Administered 2018-11-06: 5 [IU] via SUBCUTANEOUS

## 2018-11-03 MED ORDER — SENNOSIDES-DOCUSATE SODIUM 8.6-50 MG PO TABS
1.0000 | ORAL_TABLET | Freq: Every evening | ORAL | Status: DC | PRN
  Administered 2018-11-12 – 2018-11-25 (×2): 1 via ORAL
  Filled 2018-11-03: qty 1

## 2018-11-03 MED ORDER — ACETAMINOPHEN 325 MG PO TABS
650.0000 mg | ORAL_TABLET | Freq: Four times a day (QID) | ORAL | Status: DC | PRN
  Administered 2018-11-04 – 2018-11-27 (×3): 650 mg via ORAL
  Filled 2018-11-03 (×3): qty 2

## 2018-11-03 MED ORDER — ACETAMINOPHEN 500 MG PO TABS
1000.0000 mg | ORAL_TABLET | Freq: Once | ORAL | Status: AC
  Administered 2018-11-03: 1000 mg via ORAL
  Filled 2018-11-03: qty 2

## 2018-11-03 NOTE — ED Triage Notes (Signed)
Patient presents with nonproductive cough and shortness of breath, starting 2 days ago. Patient was with family on Father's day. Since then, Patient's family has one by one tested positive for Covid 19. Patient was tested 10/28/18. Patient got the call today that he was positive. Patient denies nausea/vomiting/diarrhea. Patient febrile in triage. Patient ambulatory and speaking in full sentences in triage, without oxygen.

## 2018-11-03 NOTE — H&P (Signed)
History and Physical    MELANIE PELLOT JJH:[id number] DOB: May 19, 1940 DOA: 11/03/2018  PCP: Velna Hatchet, MD  Patient coming from: Home  Chief Complaint: Nonproductive cough, SOB. Positive family exposure to COVID, tested positive himself outpatient.  History gathered over the phone.  HPI: Andrew Larsen is a 78 y.o. male with medical history significant of PAD, DM, HTN, BPH, OSA who presents due to SOB and nonproductive cough. He has had contact with many family members who have tested positive for COVID 19. He was tested as an outpatient and was told he was positive today; he then reported to them that he wasn't feeling very well.  He admits to being sore all over, does not know if he had any fevers at home as he did not take his temperature, has nonproductive cough as well as shortness of breath.  Denies any nausea, vomiting, diarrhea or abdominal pain.  He has had no appetite.  Denies any loss of taste or smell.  ED Course: Labs obtained which revealed creatinine 1.78, lactic acid 0.7.  Rapid COVID test positive.  Chest x-ray reveals cardiomegaly, bilateral interstitial prominence, pneumonitis versus CHF.  Review of Systems: As per HPI. Otherwise, all other review of systems reviewed and are negative.   Past Medical History:  Diagnosis Date  . CKD (chronic kidney disease)    CKD stage III (01/2018)  . Daytime somnolence   . Diabetes mellitus without complication ([place])   . Fracture    right fibula/wears boot cast  . GERD (gastroesophageal reflux disease)   . Glaucoma   . Hypercholesteremia   . Hyperlipidemia   . Hypertension   . Mold exposure   . PAD (peripheral artery disease) ([place])   . Sleep apnea     Past Surgical History:  Procedure Laterality Date  . LOWER EXTREMITY ANGIOGRAPHY N/A 06/12/2016   Procedure: Lower Extremity Angiography;  Surgeon: Adrian Prows, MD;  Location: [facility];  Service: Cardiovascular;  Laterality: N/A;  . MUSCLE BIOPSY Right 05/06/2018   Procedure: right vastus lateralis muscle biopsy;  Surgeon: Earnie Larsson, MD;  Location: [facility];  Service: Neurosurgery;  Laterality: Right;  . ORIF ANKLE FRACTURE Right 10/02/2012   Procedure: OPEN REDUCTION INTERNAL FIXATION (ORIF) ANKLE FRACTURE;  Surgeon: Meredith Pel, MD;  Location: [facility];  Service: Orthopedics;  Laterality: Right;  . PERIPHERAL VASCULAR ATHERECTOMY Left 06/12/2016   Procedure: Peripheral Vascular Atherectomy-Left Popliteal;  Surgeon: Adrian Prows, MD;  Location: [facility];  Service: Cardiovascular;  Laterality: Left;  . PERIPHERAL VASCULAR INTERVENTION Left 06/12/2016   Procedure: Peripheral Vascular Intervention- DCB Left Popliteal;  Surgeon: Adrian Prows, MD;  Location: [facility];  Service: Cardiovascular;  Laterality: Left;     reports that he quit smoking about 37 years ago. He has a 20.00 pack-year smoking history. He has never used smokeless tobacco. He reports that he does not drink alcohol or use drugs.  Allergies  Allergen Reactions  . Metformin And Related   . Simbrinza [Brinzolamide-Brimonidine]     Drainage, redness to eyes    Family History  Problem Relation Age of Onset  . Heart disease Father   . Prostate cancer Brother   . Cancer Brother   . Diabetes Brother   . Colon cancer Neg Hx     Prior to Admission medications   Medication Sig Start Date End Date Taking? Authorizing Provider  aspirin 81 MG chewable tablet Chew 81 mg by mouth at bedtime.    Yes  [provider]  brimonidine-timolol (COMBIGAN) 0.2-0.5 % ophthalmic solution Place 1 drop into both eyes every 12 (twelve) hours.   Yes [provider]  calcium carbonate (CALCIUM 600) 600 MG TABS tablet Take 1,200 mg by mouth daily.    Yes [provider]  Cholecalciferol (VITAMIN D3) 5000 units TABS Take 10,000 Units by mouth daily.    Yes [provider]  cilostazol (PLETAL) 100 MG tablet Take 100 mg by mouth 2 (two) times daily.   Yes [provider]  Evolocumab (REPATHA) 140 MG/ML SOSY Inject 140 mg into the skin every 14 (fourteen) days.   Yes [provider]  famotidine (PEPCID) 20 MG tablet Take 20 mg by mouth at bedtime.   Yes [provider]  furosemide (LASIX) 40 MG tablet Take 40 mg by mouth daily.   Yes [provider]  gabapentin (NEURONTIN) 300 MG capsule Take 600 mg by mouth 3 (three) times daily.    Yes [provider]  insulin NPH-regular Human (NOVOLIN 70/30) (70-30) 100 UNIT/ML injection Inject 30-50 Units into the skin See admin instructions. Inject 50 units subcutaneously in am and 30 units in the evening   Yes [provider]  latanoprost (XALATAN) 0.005 % ophthalmic solution Place 1 drop into both eyes at bedtime.   Yes [provider]  magnesium gluconate (MAGONATE) 500 MG tablet Take 500 mg by mouth daily.   Yes [provider]  Multiple Vitamin (MULTIVITAMIN) capsule Take 1 capsule by mouth daily.    Yes [provider]  olmesartan (BENICAR) 20 MG tablet Take 20 mg by mouth daily.   Yes [provider]  oxybutynin (DITROPAN) 5 MG tablet Take 5 mg by mouth 2 (two) times daily.   Yes [provider]  potassium chloride SA (K-DUR,KLOR-CON) 20 MEQ tablet Take 20 mEq by mouth daily.   Yes [provider]  tamsulosin (FLOMAX) 0.4 MG CAPS capsule Take 0.4 mg by mouth daily.   Yes [provider]  valsartan (DIOVAN) 160 MG tablet Take 160 mg by mouth daily. 09/12/18  Yes [provider]  HYDROcodone-acetaminophen (NORCO/VICODIN) 5-325 MG tablet Take 1 tablet by mouth every 4 (four) hours as needed for moderate pain. Patient not taking: Reported on 11/03/2018 05/06/18 05/06/19  Earnie Larsson, MD    Physical Exam: Vitals:   11/03/18 1500 11/03/18 1530 11/03/18 1533 11/03/18 1600  BP: (!) 157/86 (!) 154/75 (!) 154/75 (!) 145/70  Pulse:  77 85 95  Resp: (!) 21 (!) 21 19 (!) 23  Temp:      TempSrc:       SpO2:  97% 94% 95%  Weight:       Please note physical exam was not completed as patient was already being transported by EMS to [facility] by the time I arrived to the emergency department.  Following physical exam by ED physician.  Constitutional:      General: He is not in acute distress.    Appearance: Normal appearance. He is well-developed.  HENT:     Head: Normocephalic and atraumatic.  Eyes:     Conjunctiva/sclera: Conjunctivae normal.     Pupils: Pupils are equal, round, and reactive to light.  Neck:     Musculoskeletal: Normal range of motion and neck supple.  Cardiovascular:     Rate and Rhythm: Normal rate and regular rhythm.     Heart sounds: Normal heart sounds.  Pulmonary:     Effort: Pulmonary effort is normal.  No respiratory distress.     Breath sounds: Normal breath sounds.  Abdominal:     General: There is no distension.     Palpations: Abdomen is soft.     Tenderness: There is no abdominal tenderness.  Musculoskeletal: Normal range of motion.        General: No deformity.  Skin:    General: Skin is warm and dry.  Neurological:     Mental Status: He is alert and oriented to person, place, and time.   Labs on Admission: I have personally reviewed following labs and imaging studies  CBC: Recent Labs  Lab 11/03/18 1429  WBC 4.8  NEUTROABS 2.8  HGB 13.8  HCT 42.1  MCV 93.3  PLT 010   Basic Metabolic Panel: Recent Labs  Lab 11/03/18 1429  NA 135  K 4.2  CL 100  CO2 27  GLUCOSE 111*  BUN 23  CREATININE 1.78*  CALCIUM 8.3*   GFR: CrCl cannot be calculated (Unknown ideal weight.). Liver Function Tests: Recent Labs  Lab 11/03/18 1429  AST 55*  ALT 42  ALKPHOS 77  BILITOT 0.4  PROT 6.7  ALBUMIN 3.5   No results for input(s): LIPASE, AMYLASE in the last 168 hours. No results for input(s): AMMONIA in the last 168 hours. Coagulation Profile: No results for input(s): INR, PROTIME in the last 168 hours. Cardiac Enzymes: No  results for input(s): CKTOTAL, CKMB, CKMBINDEX, TROPONINI in the last 168 hours. BNP (last 3 results) No results for input(s): PROBNP in the last 8760 hours. HbA1C: No results for input(s): HGBA1C in the last 72 hours. CBG: Recent Labs  Lab 11/03/18 1439  GLUCAP 105*   Lipid Profile: No results for input(s): CHOL, HDL, LDLCALC, TRIG, CHOLHDL, LDLDIRECT in the last 72 hours. Thyroid Function Tests: No results for input(s): TSH, T4TOTAL, FREET4, T3FREE, THYROIDAB in the last 72 hours. Anemia Panel: No results for input(s): VITAMINB12, FOLATE, FERRITIN, TIBC, IRON, RETICCTPCT in the last 72 hours. Urine analysis: No results found for: COLORURINE, APPEARANCEUR, LABSPEC, PHURINE, GLUCOSEU, HGBUR, BILIRUBINUR, KETONESUR, PROTEINUR, UROBILINOGEN, NITRITE, LEUKOCYTESUR Sepsis Labs: !!!!!!!!!!!!!!!!!!!!!!!!!!!!!!!!!!!!!!!!!!!! @LABRCNTIP (procalcitonin:4,lacticidven:4) ) Recent Results (from the past 240 hour(s))  SARS Coronavirus 2 (CEPHEID- Performed in [facility] hospital lab), Hosp Order     Status: Abnormal   Collection Time: 11/03/18  2:29 PM   Specimen: Nasopharyngeal Swab  Result Value Ref Range Status   SARS Coronavirus 2 POSITIVE (A) NEGATIVE Final    Comment: RESULT CALLED TO, READ BACK BY AND VERIFIED WITH: ZULETA,C AT 1600 ON 11/03/2018 BY JPM (NOTE) If result is NEGATIVE SARS-CoV-2 target nucleic acids are NOT DETECTED. The SARS-CoV-2 RNA is generally detectable in upper and lower  respiratory specimens during the acute phase of infection. The lowest  concentration of SARS-CoV-2 viral copies this assay can detect is 250  copies / mL. A negative result does not preclude SARS-CoV-2 infection  and should not be used as the sole basis for treatment or other  patient management decisions.  A negative result may occur with  improper specimen collection / handling, submission of specimen other  than nasopharyngeal swab, presence of viral mutation(s) within the  areas targeted by  this assay, and inadequate number of viral copies  (<250 copies / mL). A negative result must be combined with clinical  observations, patient history, and epidemiological information. If result is POSITIVE SARS-CoV-2 target nucleic acids are DETECTED. T he SARS-CoV-2 RNA is generally detectable in upper and lower  respiratory specimens during the acute phase of infection.  Positive  results are indicative of active infection with SARS-CoV-2.  Clinical  correlation with patient history and other diagnostic information is  necessary to determine patient infection status.  Positive results do  not rule out bacterial infection or co-infection with other viruses. If result is PRESUMPTIVE POSTIVE SARS-CoV-2 nucleic acids MAY BE PRESENT.   A presumptive positive result was obtained on the submitted specimen  and confirmed on repeat testing.  While 2019 novel coronavirus  (SARS-CoV-2) nucleic acids may be present in the submitted sample  additional confirmatory testing may be necessary for epidemiological  and / or clinical management purposes  to differentiate between  SARS-CoV-2 and other Sarbecovirus currently known to infect humans.  If clinically indicated additional testing with an alternate test  methodology [id number]) is  advised. The SARS-CoV-2 RNA is generally  detectable in upper and lower respiratory specimens during the acute  phase of infection. The expected result is Negative. Fact Sheet for Patients:  StrictlyIdeas.no Fact Sheet for Healthcare Providers: BankingDealers.co.za This test is not yet approved or cleared by the [place] FDA and has been authorized for detection and/or diagnosis of SARS-CoV-2 by FDA under an Emergency Use Authorization (EUA).  This EUA will remain in effect (meaning this test can be used) for the duration of the COVID-19 declaration under Section 564(b)(1) of the Act, 21 U.S.C. section  360bbb-3(b)(1), unless the authorization is terminated or revoked sooner. Performed at [facility], [place] [address], [place], [place] [address]      Radiological Exams on Admission: Dg Chest Port 1 View  Result Date: 11/03/2018 CLINICAL DATA:  Cough.  COVID-19. EXAM: PORTABLE CHEST 1 VIEW COMPARISON:  03/25/2016. FINDINGS: Mediastinum hilar structures normal. Cardiomegaly with mild pulmonary venous congestion and bilateral interstitial prominence. CHF could present this fashion. Pneumonitis particularly in light of the patient's history of COVID-19 infection could also present this fashion. Small left pleural effusion cannot be excluded. IMPRESSION: 1. Cardiomegaly with mild pulmonary venous congestion and bilateral interstitial prominence. CHF could present in this fashion. Pneumonitis particularly in light of the patient's history of COVID-19 infection could also present this fashion. 2.  Small left pleural effusion cannot be excluded. Electronically Signed   By: Marcello Moores  Register   On: 11/03/2018 14:31    Assessment/Plan Principal Problem:   COVID-19 virus infection Active Problems:   Morbid obesity ([place])   Claudication in peripheral vascular disease (HCC)   CKD (chronic kidney disease) stage 3, GFR 30-59 ml/min (HCC)   Type 2 diabetes mellitus ([place])   Essential hypertension   COVID-19 -Currently on room air and satting in the 90s -Transfer to [facility] and monitor oxygenation closely -Labs pending including procalcitonin, ferritin, CRP, d-dimer, fibrinogen, LDH  CKD stage III -Recent baseline creatinine 1.57, 6 months ago -Stable   Type 2 diabetes with neuropathy -Lantus, sliding scale insulin -Continue Neurontin  CAD -Continue aspirin  PAD -Continue Pletal  ? CHF -No echocardiogram on file. Will hold off on lasix for now due to positive COVID  -Takes Lasix at home, will continue    Hypertension -Continue Benicar  OSA -He wears CPAP at  nighttime, will hold as patient has tested positive for COVID  Morbid obesity -Body mass index is 35.75 kg/m.   DVT prophylaxis: Subcutaneous heparin Code Status: Full code, confirmed with patient at time of admission Family Communication: None Disposition Plan: Pending further improvement Consults called: None  Admission status: Observation   Severity of Illness: The appropriate patient status for this patient is  OBSERVATION. Observation status is judged to be reasonable and necessary in order to provide the required intensity of service to ensure the patient's safety. The patient's presenting symptoms, physical exam findings, and initial radiographic and laboratory data in the context of their medical condition is felt to place them at decreased risk for further clinical deterioration. Furthermore, it is anticipated that the patient will be medically stable for discharge from the hospital within 2 midnights of admission.    Dessa Phi, DO [facility] 11/03/2018, 5:14 PM    How to contact the [facility] Attending or Consulting provider [place] or covering provider during after hours [place], for this patient?  1. Check the care team in [facility] and look for a) attending/consulting [facility] provider listed and b) the [facility] team listed 2. Log into [email or web address] and use [place]'s universal password to access. If you do not have the password, please contact the hospital operator. 3. Locate the [facility] provider you are looking for under [facility] and page to a number that you can be directly reached. 4. If you still have difficulty reaching the provider, please page the [facility] (Director on Call) for the Hospitalists listed on amion for assistance.

## 2018-11-03 NOTE — Plan of Care (Signed)
  Problem: Education: Goal: Knowledge of risk factors and measures for prevention of condition will improve 11/03/2018 2229 by Henreitta Leber, RN Outcome: Progressing 11/03/2018 1954 by Henreitta Leber, RN Outcome: Progressing   Problem: Coping: Goal: Psychosocial and spiritual needs will be supported 11/03/2018 2229 by Henreitta Leber, RN Outcome: Progressing 11/03/2018 1954 by Henreitta Leber, RN Outcome: Progressing   Problem: Respiratory: Goal: Will maintain a patent airway 11/03/2018 2229 by Henreitta Leber, RN Outcome: Progressing 11/03/2018 1954 by Henreitta Leber, RN Outcome: Progressing Goal: Complications related to the disease process, condition or treatment will be avoided or minimized 11/03/2018 2229 by Henreitta Leber, RN Outcome: Progressing 11/03/2018 1954 by Henreitta Leber, RN Outcome: Progressing   Problem: Education: Goal: Knowledge of General Education information will improve Description: Including pain rating scale, medication(s)/side effects and non-pharmacologic comfort measures 11/03/2018 2229 by Henreitta Leber, RN Outcome: Progressing 11/03/2018 1954 by Henreitta Leber, RN Outcome: Progressing   Problem: Health Behavior/Discharge Planning: Goal: Ability to manage health-related needs will improve 11/03/2018 2229 by Henreitta Leber, RN Outcome: Progressing 11/03/2018 1954 by Henreitta Leber, RN Outcome: Progressing   Problem: Clinical Measurements: Goal: Ability to maintain clinical measurements within normal limits will improve 11/03/2018 2229 by Henreitta Leber, RN Outcome: Progressing 11/03/2018 1954 by Henreitta Leber, RN Outcome: Progressing Goal: Will remain free from infection 11/03/2018 2229 by Henreitta Leber, RN Outcome: Progressing 11/03/2018 1954 by Henreitta Leber, RN Outcome: Progressing Goal: Diagnostic test results will improve 11/03/2018 2229 by Henreitta Leber, RN Outcome: Progressing 11/03/2018 1954  by Henreitta Leber, RN Outcome: Progressing Goal: Respiratory complications will improve 11/03/2018 2229 by Henreitta Leber, RN Outcome: Progressing 11/03/2018 1954 by Henreitta Leber, RN Outcome: Progressing Goal: Cardiovascular complication will be avoided 11/03/2018 2229 by Henreitta Leber, RN Outcome: Progressing 11/03/2018 1954 by Henreitta Leber, RN Outcome: Progressing   Problem: Activity: Goal: Risk for activity intolerance will decrease 11/03/2018 2229 by Henreitta Leber, RN Outcome: Progressing 11/03/2018 1954 by Henreitta Leber, RN Outcome: Progressing   Problem: Nutrition: Goal: Adequate nutrition will be maintained 11/03/2018 2229 by Henreitta Leber, RN Outcome: Progressing 11/03/2018 1954 by Henreitta Leber, RN Outcome: Progressing   Problem: Coping: Goal: Level of anxiety will decrease 11/03/2018 2229 by Henreitta Leber, RN Outcome: Progressing 11/03/2018 1954 by Henreitta Leber, RN Outcome: Progressing   Problem: Elimination: Goal: Will not experience complications related to bowel motility 11/03/2018 2229 by Henreitta Leber, RN Outcome: Progressing 11/03/2018 1954 by Henreitta Leber, RN Outcome: Progressing Goal: Will not experience complications related to urinary retention 11/03/2018 2229 by Henreitta Leber, RN Outcome: Progressing 11/03/2018 1954 by Henreitta Leber, RN Outcome: Progressing   Problem: Pain Managment: Goal: General experience of comfort will improve 11/03/2018 2229 by Henreitta Leber, RN Outcome: Progressing 11/03/2018 1954 by Henreitta Leber, RN Outcome: Progressing   Problem: Safety: Goal: Ability to remain free from injury will improve 11/03/2018 2229 by Henreitta Leber, RN Outcome: Progressing 11/03/2018 1954 by Henreitta Leber, RN Outcome: Progressing   Problem: Skin Integrity: Goal: Risk for impaired skin integrity will decrease 11/03/2018 2229 by Henreitta Leber, RN Outcome:  Progressing 11/03/2018 1954 by Henreitta Leber, RN Outcome: Progressing

## 2018-11-03 NOTE — ED Notes (Signed)
Patient transported to [facility] via [employer] [facility]

## 2018-11-03 NOTE — Progress Notes (Signed)
Pt arrived via care link. IVs intact. Pt walked from stretcher to bed. No shortness of breath noted. VSS.

## 2018-11-03 NOTE — Plan of Care (Signed)

## 2018-11-03 NOTE — ED Notes (Signed)
EDMD at bedside

## 2018-11-03 NOTE — ED Provider Notes (Signed)
[facility] DEPT Provider Note   CSN: [id number] Arrival date & time: 11/03/18  1253     History   Chief Complaint Chief Complaint  Patient presents with  . Covid 19 +  . Cough    HPI Andrew Larsen is a 78 y.o. male.     78 year old male with prior medical history as detailed below presents for evaluation.  Patient reports that he was tested last Tuesday for possible COVID.  He was called today with his results that were positive.  He reports fever with associated cough and mild shortness of breath over the last 72 hours.  He reports multiple sick contacts in his family who have all been diagnosed with COVID.  He denies chest pain.  He is not take anything today for his fever.  He denies abdominal pain, diarrhea, or other acute complaint.    The history is provided by the patient and medical records.  Cough Cough characteristics:  Dry Sputum characteristics:  Nondescript Severity:  Moderate Onset quality:  Gradual Duration:  3 days Timing:  Constant Progression:  Waxing and waning Chronicity:  New Relieved by:  Nothing Worsened by:  Nothing Ineffective treatments:  None tried Associated symptoms: fever and shortness of breath     Past Medical History:  Diagnosis Date  . CKD (chronic kidney disease)    CKD stage III (01/2018)  . Daytime somnolence   . Diabetes mellitus without complication ([place])   . Fracture    right fibula/wears boot cast  . GERD (gastroesophageal reflux disease)   . Glaucoma   . Hypercholesteremia   . Hyperlipidemia   . Hypertension   . Mold exposure   . PAD (peripheral artery disease) ([place])   . Sleep apnea     Patient Active Problem List   Diagnosis Date Noted  . Claudication in peripheral vascular disease ([place]) 06/10/2016  . Morbid obesity ([place]) 09/21/2015  . Upper airway cough syndrome 09/20/2015  . Cough variant asthma 09/20/2015    Past Surgical History:  Procedure Laterality Date  . LOWER  EXTREMITY ANGIOGRAPHY N/A 06/12/2016   Procedure: Lower Extremity Angiography;  Surgeon: Adrian Prows, MD;  Location: [facility];  Service: Cardiovascular;  Laterality: N/A;  . MUSCLE BIOPSY Right 05/06/2018   Procedure: right vastus lateralis muscle biopsy;  Surgeon: Earnie Larsson, MD;  Location: [facility];  Service: Neurosurgery;  Laterality: Right;  . ORIF ANKLE FRACTURE Right 10/02/2012   Procedure: OPEN REDUCTION INTERNAL FIXATION (ORIF) ANKLE FRACTURE;  Surgeon: Meredith Pel, MD;  Location: [facility];  Service: Orthopedics;  Laterality: Right;  . PERIPHERAL VASCULAR ATHERECTOMY Left 06/12/2016   Procedure: Peripheral Vascular Atherectomy-Left Popliteal;  Surgeon: Adrian Prows, MD;  Location: [facility];  Service: Cardiovascular;  Laterality: Left;  . PERIPHERAL VASCULAR INTERVENTION Left 06/12/2016   Procedure: Peripheral Vascular Intervention- DCB Left Popliteal;  Surgeon: Adrian Prows, MD;  Location: [facility];  Service: Cardiovascular;  Laterality: Left;        Home Medications    Prior to Admission medications   Medication Sig Start Date End Date Taking? Authorizing Provider  aspirin 81 MG chewable tablet Chew 81 mg by mouth at bedtime.     [provider]  brimonidine-timolol (COMBIGAN) 0.2-0.5 % ophthalmic solution Place 1 drop into both eyes every 12 (twelve) hours.    [provider]  calcium carbonate (CALCIUM 600) 600 MG TABS tablet Take 1,200 mg by mouth daily.     [provider]  Cholecalciferol (VITAMIN D3) 5000 units TABS Take 10,000 Units by mouth daily.     [provider]  cilostazol (PLETAL) 100 MG tablet Take 100 mg by mouth 2 (two) times daily.    [provider]  Evolocumab (REPATHA) 140 MG/ML SOSY Inject 140 mg into the skin every 14 (fourteen) days.    [provider]  famotidine (PEPCID) 20 MG tablet Take 20 mg by mouth at bedtime.    [provider]  furosemide (LASIX) 40 MG tablet Take 40  mg by mouth daily.    [provider]  gabapentin (NEURONTIN) 300 MG capsule Take 600 mg by mouth 3 (three) times daily.     [provider]  HYDROcodone-acetaminophen (NORCO/VICODIN) 5-325 MG tablet Take 1 tablet by mouth every 4 (four) hours as needed for moderate pain. 05/06/18 05/06/19  Earnie Larsson, MD  insulin NPH-regular Human (NOVOLIN 70/30) (70-30) 100 UNIT/ML injection Inject 30-50 Units into the skin See admin instructions. Inject 50 units subcutaneously in am and 30 units in the evening    [provider]  JANUVIA 100 MG tablet Take 100 mg by mouth daily. 03/09/16   [provider]  latanoprost (XALATAN) 0.005 % ophthalmic solution Place 1 drop into both eyes at bedtime.    [provider]  magnesium gluconate (MAGONATE) 500 MG tablet Take 500 mg by mouth daily.    [provider]  Multiple Vitamin (MULTIVITAMIN) capsule Take 1 capsule by mouth daily.     [provider]  olmesartan (BENICAR) 20 MG tablet Take 20 mg by mouth daily.    [provider]  omeprazole (PRILOSEC) 40 MG capsule Take 40 mg by mouth 2 (two) times daily.     [provider]  oxybutynin (DITROPAN) 5 MG tablet Take 5 mg by mouth 2 (two) times daily.    [provider]  potassium chloride SA (K-DUR,KLOR-CON) 20 MEQ tablet Take 20 mEq by mouth daily.    [provider]  tamsulosin (FLOMAX) 0.4 MG CAPS capsule Take 0.4 mg by mouth daily.    [provider]    Family History Family History  Problem Relation Age of Onset  . Heart disease Father   . Prostate cancer Brother   . Cancer Brother   . Diabetes Brother   . Colon cancer Neg Hx     Social History Social History   Tobacco Use  . Smoking status: Former Smoker    Packs/day: 1.00    Years: 20.00    Pack years: 20.00    Quit date: 08/22/1981    Years since quitting: 37.2  . Smokeless tobacco: Never Used  Substance Use Topics  . Alcohol use: No     Alcohol/week: 0.0 standard drinks  . Drug use: No     Allergies   Metformin and related and Simbrinza [brinzolamide-brimonidine]   Review of Systems Review of Systems  Constitutional: Positive for fever.  Respiratory: Positive for cough and shortness of breath.   All other systems reviewed and are negative.    Physical Exam Updated Vital Signs BP (!) 145/70 (BP Location: Left Arm)   Pulse 85   Temp (!) 102.2 F (39 C) (Oral)   Resp 20   Wt 117.9 kg   SpO2 96%   BMI 35.75 kg/m   Physical Exam Vitals signs and nursing note reviewed.  Constitutional:      General: He is not in acute distress.    Appearance: Normal appearance. He is  well-developed.  HENT:     Head: Normocephalic and atraumatic.  Eyes:     Conjunctiva/sclera: Conjunctivae normal.     Pupils: Pupils are equal, round, and reactive to light.  Neck:     Musculoskeletal: Normal range of motion and neck supple.  Cardiovascular:     Rate and Rhythm: Normal rate and regular rhythm.     Heart sounds: Normal heart sounds.  Pulmonary:     Effort: Pulmonary effort is normal. No respiratory distress.     Breath sounds: Normal breath sounds.  Abdominal:     General: There is no distension.     Palpations: Abdomen is soft.     Tenderness: There is no abdominal tenderness.  Musculoskeletal: Normal range of motion.        General: No deformity.  Skin:    General: Skin is warm and dry.  Neurological:     Mental Status: He is alert and oriented to person, place, and time.      ED Treatments / Results  Labs (all labs ordered are listed, but only abnormal results are displayed) Labs Reviewed  SARS CORONAVIRUS 2 ([facility]) - Abnormal; Notable for the following components:      Result Value   SARS Coronavirus 2 POSITIVE (*)    All other components within normal limits  COMPREHENSIVE METABOLIC PANEL - Abnormal; Notable for the following components:   Glucose, Bld  111 (*)    Creatinine, Ser 1.78 (*)    Calcium 8.3 (*)    AST 55 (*)    GFR calc non Af Amer 36 (*)    GFR calc Af Amer 42 (*)    All other components within normal limits  CBG MONITORING, ED - Abnormal; Notable for the following components:   Glucose-Capillary 105 (*)    All other components within normal limits  CULTURE, BLOOD (ROUTINE X 2)  CULTURE, BLOOD (ROUTINE X 2)  CBC WITH DIFFERENTIAL/PLATELET  LACTIC ACID, PLASMA  LACTIC ACID, PLASMA  D-DIMER, QUANTITATIVE (NOT AT [facility])  PROCALCITONIN  LACTATE DEHYDROGENASE  FERRITIN  TRIGLYCERIDES  FIBRINOGEN  C-REACTIVE PROTEIN    EKG None  Radiology Dg Chest Port 1 View  Result Date: 11/03/2018 CLINICAL DATA:  Cough.  COVID-19. EXAM: PORTABLE CHEST 1 VIEW COMPARISON:  03/25/2016. FINDINGS: Mediastinum hilar structures normal. Cardiomegaly with mild pulmonary venous congestion and bilateral interstitial prominence. CHF could present this fashion. Pneumonitis particularly in light of the patient's history of COVID-19 infection could also present this fashion. Small left pleural effusion cannot be excluded. IMPRESSION: 1. Cardiomegaly with mild pulmonary venous congestion and bilateral interstitial prominence. CHF could present in this fashion. Pneumonitis particularly in light of the patient's history of COVID-19 infection could also present this fashion. 2.  Small left pleural effusion cannot be excluded. Electronically Signed   By: Marcello Moores  Register   On: 11/03/2018 14:31    Procedures Procedures (including critical care time)  Medications Ordered in ED Medications  acetaminophen (TYLENOL) tablet 1,000 mg (has no administration in time range)     Initial Impression / Assessment and Plan / ED Course  I have reviewed the triage vital signs and the nursing notes.  Pertinent labs & imaging results that were available during my care of the patient were reviewed by me and considered in my medical decision making (see chart for  details).        MDM  Screen complete  ELICK AGUILERA was evaluated in Emergency Department on  11/03/2018 for the symptoms described in the history of present illness. He was evaluated in the context of the global COVID-19 pandemic, which necessitated consideration that the patient might be at risk for infection with the SARS-CoV-2 virus that causes COVID-19. Institutional protocols and algorithms that pertain to the evaluation of patients at risk for COVID-19 are in a state of rapid change based on information released by regulatory bodies including the CDC and federal and state organizations. These policies and algorithms were followed during the patient's care in the ED.  Patient is presenting for evaluation of cough, dyspnea, and reported COVID positive status.  Patient is clearly positive.  Chest x-ray does suggest pneumonitis.  Patient with multiple comorbidities.  Patient would likely benefit from observation and possible further work-up and treatment with the medicine service.  Hospitalist aware of case and will evaluate for admission.  Final Clinical Impressions(s) / ED Diagnoses   Final diagnoses:  Cough  COVID-19    ED Discharge Orders    None       Valarie Merino, MD 11/03/18 1627

## 2018-11-03 NOTE — ED Notes (Signed)
ED TO INPATIENT HANDOFF REPORT  Name/Age/Gender Andrew Larsen 78 y.o. male  Code Status Code Status History    Date Active Date Inactive Code Status Order ID Comments User Context   06/12/2016 1236 06/12/2016 2159 Full Code [id number]  Adrian Prows, MD Inpatient   10/03/2012 0144 10/04/2012 1841 Full Code [id number]  Meredith Pel, MD Inpatient   Advance Care Planning Activity    Advance Directive Documentation     Most Recent Value  Type of Advance Directive  Healthcare Power of Attorney, Living will  Pre-existing out of facility DNR order (yellow form or pink MOST form)  -  "MOST" Form in Place?  -      Home/SNF/Other Home  Chief Complaint Covid +, Cough  Level of Care/Admitting Diagnosis ED Disposition    ED Disposition Condition [place] [100101]  Level of Care: Telemetry [5]  Covid Evaluation: Confirmed COVID Positive  Diagnosis: COVID-19 virus infection [[id number]]  Admitting Physician: Dessa Phi [[id number]]  Attending Physician: Dessa Phi [phone]  PT Class (Do Not Modify): Observation [104]  PT Acc Code (Do Not Modify): Observation [10022]       Medical History Past Medical History:  Diagnosis Date  . CKD (chronic kidney disease)    CKD stage III (01/2018)  . Daytime somnolence   . Diabetes mellitus without complication ([place])   . Fracture    right fibula/wears boot cast  . GERD (gastroesophageal reflux disease)   . Glaucoma   . Hypercholesteremia   . Hyperlipidemia   . Hypertension   . Mold exposure   . PAD (peripheral artery disease) ([place])   . Sleep apnea     Allergies Allergies  Allergen Reactions  . Metformin And Related   . Simbrinza [Brinzolamide-Brimonidine]     Drainage, redness to eyes    IV Location/Drains/Wounds Patient Lines/Drains/Airways Status   Active Line/Drains/Airways    Name:   Placement date:   Placement time:   Site:   Days:   Peripheral IV 11/03/18 Right  Antecubital   11/03/18    1443    Antecubital   less than 1   Incision (Closed) 05/06/18 Leg Right   05/06/18    0849     181          Labs/Imaging Results for orders placed or performed during the hospital encounter of 11/03/18 (from the past 48 hour(s))  Comprehensive metabolic panel     Status: Abnormal   Collection Time: 11/03/18  2:29 PM  Result Value Ref Range   Sodium 135 135 - 145 mmol/L   Potassium 4.2 3.5 - 5.1 mmol/L   Chloride 100 98 - 111 mmol/L   CO2 27 22 - 32 mmol/L   Glucose, Bld 111 (H) 70 - 99 mg/dL   BUN 23 8 - 23 mg/dL   Creatinine, Ser 1.78 (H) 0.61 - 1.24 mg/dL   Calcium 8.3 (L) 8.9 - 10.3 mg/dL   Total Protein 6.7 6.5 - 8.1 g/dL   Albumin 3.5 3.5 - 5.0 g/dL   AST 55 (H) 15 - 41 U/L   ALT 42 0 - 44 U/L   Alkaline Phosphatase 77 38 - 126 U/L   Total Bilirubin 0.4 0.3 - 1.2 mg/dL   GFR calc non Af Amer 36 (L) >60 mL/min   GFR calc Af Amer 42 (L) >60 mL/min   Anion gap 8 5 - 15    Comment: Performed at [facility], [place] [address], [place], [place] [address]  CBC with Differential     Status: None   Collection Time: 11/03/18  2:29 PM  Result Value Ref Range   WBC 4.8 4.0 - 10.5 K/uL   RBC 4.51 4.22 - 5.81 MIL/uL   Hemoglobin 13.8 13.0 - 17.0 g/dL   HCT 42.1 39.0 - 52.0 %   MCV 93.3 80.0 - 100.0 fL   MCH 30.6 26.0 - 34.0 pg   MCHC 32.8 30.0 - 36.0 g/dL   RDW 13.8 11.5 - 15.5 %   Platelets 161 150 - 400 K/uL   nRBC 0.0 0.0 - 0.2 %   Neutrophils Relative % 58 %   Neutro Abs 2.8 1.7 - 7.7 K/uL   Lymphocytes Relative 27 %   Lymphs Abs 1.3 0.7 - 4.0 K/uL   Monocytes Relative 14 %   Monocytes Absolute 0.7 0.1 - 1.0 K/uL   Eosinophils Relative 1 %   Eosinophils Absolute 0.1 0.0 - 0.5 K/uL   Basophils Relative 0 %   Basophils Absolute 0.0 0.0 - 0.1 K/uL   Immature Granulocytes 0 %   Abs Immature Granulocytes 0.00 0.00 - 0.07 K/uL    Comment: Performed at [facility], [place] [address] [place], [place] [address]   SARS Coronavirus 2 (CEPHEID- Performed in [facility] hospital lab), Hosp Order     Status: Abnormal   Collection Time: 11/03/18  2:29 PM   Specimen: Nasopharyngeal Swab  Result Value Ref Range   SARS Coronavirus 2 POSITIVE (A) NEGATIVE    Comment: RESULT CALLED TO, READ BACK BY AND VERIFIED WITH: ZULETA,C AT 1600 ON 11/03/2018 BY JPM (NOTE) If result is NEGATIVE SARS-CoV-2 target nucleic acids are NOT DETECTED. The SARS-CoV-2 RNA is generally detectable in upper and lower  respiratory specimens during the acute phase of infection. The lowest  concentration of SARS-CoV-2 viral copies this assay can detect is 250  copies / mL. A negative result does not preclude SARS-CoV-2 infection  and should not be used as the sole basis for treatment or other  patient management decisions.  A negative result may occur with  improper specimen collection / handling, submission of specimen other  than nasopharyngeal swab, presence of viral mutation(s) within the  areas targeted by this assay, and inadequate number of viral copies  (<250 copies / mL). A negative result must be combined with clinical  observations, patient history, and epidemiological information. If result is POSITIVE SARS-CoV-2 target nucleic acids are DETECTED. T he SARS-CoV-2 RNA is generally detectable in upper and lower  respiratory specimens during the acute phase of infection.  Positive  results are indicative of active infection with SARS-CoV-2.  Clinical  correlation with patient history and other diagnostic information is  necessary to determine patient infection status.  Positive results do  not rule out bacterial infection or co-infection with other viruses. If result is PRESUMPTIVE POSTIVE SARS-CoV-2 nucleic acids MAY BE PRESENT.   A presumptive positive result was obtained on the submitted specimen  and confirmed on repeat testing.  While 2019 novel coronavirus  (SARS-CoV-2) nucleic acids may be present in the submitted  sample  additional confirmatory testing may be necessary for epidemiological  and / or clinical management purposes  to differentiate between  SARS-CoV-2 and other Sarbecovirus currently known to infect humans.  If clinically indicated additional testing with an alternate test  methodology [phone]) is  advised. The SARS-CoV-2 RNA is generally  detectable in upper and lower respiratory specimens  during the acute  phase of infection. The expected result is Negative. Fact Sheet for Patients:  StrictlyIdeas.no Fact Sheet for Healthcare Providers: BankingDealers.co.za This test is not yet approved or cleared by the [place] FDA and has been authorized for detection and/or diagnosis of SARS-CoV-2 by FDA under an Emergency Use Authorization (EUA).  This EUA will remain in effect (meaning this test can be used) for the duration of the COVID-19 declaration under Section 564(b)(1) of the Act, 21 U.S.C. section 360bbb-3(b)(1), unless the authorization is terminated or revoked sooner. Performed at [facility], [place] [address] [place], [place] [address]   Lactic acid, plasma     Status: None   Collection Time: 11/03/18  2:29 PM  Result Value Ref Range   Lactic Acid, Venous 0.7 0.5 - 1.9 mmol/L    Comment: Performed at [facility], [place] [address], [place], [place] [address]  CBG monitoring, ED     Status: Abnormal   Collection Time: 11/03/18  2:39 PM  Result Value Ref Range   Glucose-Capillary 105 (H) 70 - 99 mg/dL   Dg Chest Port 1 View  Result Date: 11/03/2018 CLINICAL DATA:  Cough.  COVID-19. EXAM: PORTABLE CHEST 1 VIEW COMPARISON:  03/25/2016. FINDINGS: Mediastinum hilar structures normal. Cardiomegaly with mild pulmonary venous congestion and bilateral interstitial prominence. CHF could present this fashion. Pneumonitis particularly in light of the patient's history of COVID-19 infection could also  present this fashion. Small left pleural effusion cannot be excluded. IMPRESSION: 1. Cardiomegaly with mild pulmonary venous congestion and bilateral interstitial prominence. CHF could present in this fashion. Pneumonitis particularly in light of the patient's history of COVID-19 infection could also present this fashion. 2.  Small left pleural effusion cannot be excluded. Electronically Signed   By: Marcello Moores  Register   On: 11/03/2018 14:31    Pending Labs Unresulted Labs (From admission, onward)    Start     Ordered   11/03/18 1636  D-dimer, quantitative  Add-on,   AD    Comments: Used for prognosis and bed placement. Do not order CT or V/Q.    11/03/18 1636   11/03/18 1636  Fibrinogen  Add-on,   AD     11/03/18 1636   11/03/18 1636  Lactate dehydrogenase  Add-on,   AD     11/03/18 1636   11/03/18 1624  Procalcitonin  ONCE - STAT,   STAT     11/03/18 1624   11/03/18 1624  Ferritin  Once,   STAT     11/03/18 1624   11/03/18 1624  Triglycerides  Once,   STAT     11/03/18 1624   11/03/18 1624  C-reactive protein  Once,   STAT     11/03/18 1624   11/03/18 1329  Culture, blood (routine x 2)  BLOOD CULTURE X 2,   STAT     11/03/18 1328          Vitals/Pain Today's Vitals   11/03/18 1320 11/03/18 1430 11/03/18 1533  BP: (!) 145/70 138/67 (!) 154/75  Pulse: 85 86 85  Resp: 20 15 19   Temp: (!) 102.2 F (39 C)    TempSrc: Oral    SpO2: 96% 97% 94%  Weight: 117.9 kg      Isolation Precautions Airborne and Contact precautions  Medications Medications  acetaminophen (TYLENOL) tablet 1,000 mg (1,000 mg Oral Given 11/03/18 1427)    Mobility walks with person assist

## 2018-11-03 NOTE — ED Notes (Signed)
X-ray at bedside

## 2018-11-04 DIAGNOSIS — U071 COVID-19: Secondary | ICD-10-CM | POA: Diagnosis not present

## 2018-11-04 LAB — CBC WITH DIFFERENTIAL/PLATELET
Abs Immature Granulocytes: 0.01 10*3/uL (ref 0.00–0.07)
Basophils Absolute: 0 10*3/uL (ref 0.0–0.1)
Basophils Relative: 0 %
Eosinophils Absolute: 0.1 10*3/uL (ref 0.0–0.5)
Eosinophils Relative: 1 %
HCT: 43.2 % (ref 39.0–52.0)
Hemoglobin: 14.6 g/dL (ref 13.0–17.0)
Immature Granulocytes: 0 %
Lymphocytes Relative: 32 %
Lymphs Abs: 1.3 10*3/uL (ref 0.7–4.0)
MCH: 31.1 pg (ref 26.0–34.0)
MCHC: 33.8 g/dL (ref 30.0–36.0)
MCV: 92.1 fL (ref 80.0–100.0)
Monocytes Absolute: 0.6 10*3/uL (ref 0.1–1.0)
Monocytes Relative: 15 %
Neutro Abs: 2 10*3/uL (ref 1.7–7.7)
Neutrophils Relative %: 52 %
Platelets: 157 10*3/uL (ref 150–400)
RBC: 4.69 MIL/uL (ref 4.22–5.81)
RDW: 13.6 % (ref 11.5–15.5)
WBC: 4 10*3/uL (ref 4.0–10.5)
nRBC: 0 % (ref 0.0–0.2)

## 2018-11-04 LAB — COMPREHENSIVE METABOLIC PANEL
ALT: 41 U/L (ref 0–44)
AST: 58 U/L — ABNORMAL HIGH (ref 15–41)
Albumin: 3.4 g/dL — ABNORMAL LOW (ref 3.5–5.0)
Alkaline Phosphatase: 76 U/L (ref 38–126)
Anion gap: 8 (ref 5–15)
BUN: 22 mg/dL (ref 8–23)
CO2: 27 mmol/L (ref 22–32)
Calcium: 8.5 mg/dL — ABNORMAL LOW (ref 8.9–10.3)
Chloride: 101 mmol/L (ref 98–111)
Creatinine, Ser: 1.63 mg/dL — ABNORMAL HIGH (ref 0.61–1.24)
GFR calc Af Amer: 47 mL/min — ABNORMAL LOW (ref >60)
GFR calc non Af Amer: 40 mL/min — ABNORMAL LOW (ref >60)
Glucose, Bld: 118 mg/dL — ABNORMAL HIGH (ref 70–99)
Potassium: 4.2 mmol/L (ref 3.5–5.1)
Sodium: 136 mmol/L (ref 135–145)
Total Bilirubin: 0.3 mg/dL (ref 0.3–1.2)
Total Protein: 6.8 g/dL (ref 6.5–8.1)

## 2018-11-04 LAB — GLUCOSE, CAPILLARY
Glucose-Capillary: 120 mg/dL — ABNORMAL HIGH (ref 70–99)
Glucose-Capillary: 171 mg/dL — ABNORMAL HIGH (ref 70–99)
Glucose-Capillary: 200 mg/dL — ABNORMAL HIGH (ref 70–99)
Glucose-Capillary: 169 mg/dL — ABNORMAL HIGH (ref 70–99)

## 2018-11-04 LAB — D-DIMER, QUANTITATIVE: D-Dimer, Quant: 0.57 ug/mL-FEU — ABNORMAL HIGH (ref 0.00–0.50)

## 2018-11-04 LAB — C-REACTIVE PROTEIN: CRP: 10.9 mg/dL — ABNORMAL HIGH (ref <1.0)

## 2018-11-04 MED ORDER — GUAIFENESIN-DM 100-10 MG/5ML PO SYRP
5.0000 mL | ORAL_SOLUTION | ORAL | Status: DC | PRN
  Administered 2018-11-04 – 2018-11-30 (×37): 5 mL via ORAL
  Filled 2018-11-04 (×2): qty 10
  Filled 2018-11-04: qty 5
  Filled 2018-11-04 (×6): qty 10
  Filled 2018-11-04: qty 5
  Filled 2018-11-04: qty 10
  Filled 2018-11-04: qty 5
  Filled 2018-11-04 (×2): qty 10
  Filled 2018-11-04: qty 5
  Filled 2018-11-04 (×2): qty 10
  Filled 2018-11-04: qty 5
  Filled 2018-11-04: qty 10
  Filled 2018-11-04: qty 5
  Filled 2018-11-04: qty 10
  Filled 2018-11-04: qty 5
  Filled 2018-11-04 (×4): qty 10
  Filled 2018-11-04: qty 5
  Filled 2018-11-04: qty 10
  Filled 2018-11-04 (×4): qty 5
  Filled 2018-11-04 (×2): qty 10
  Filled 2018-11-04: qty 5
  Filled 2018-11-04 (×2): qty 10

## 2018-11-04 NOTE — Progress Notes (Addendum)
[place] TEAM 1 - Stepdown/ICU TEAM  CAMDON SAETERN  TDV:[id number] DOB: Oct 04, 1940 DOA: 11/03/2018 PCP: Velna Hatchet, MD    Brief Narrative:  78 year old with a history of PAD, DM, HTN, BPH, and OSA who presented to the [facility] long ED with complaints of shortness of breath and non-productive cough.  He reported exposure to many family members confirmed to be positive for SARS-CoV-2, and was himself tested as an outpatient and found to be positive on 11/03/2018.  In the ED he was confirmed to be SARS-CoV-2 positive, and had a chest x-ray which noted bilateral infiltrates suggestive of pneumonitis.  He was not significantly hypoxic.  Significant Events: 7/6 admit to the [facility] via [facility] long ED  COVID-19 specific Treatment: none  Subjective: Saturations remain in the 90s on room air.  The patient states he feels very bad.  When questioned further he reports significant dyspnea.  He also states he feels very weak and has very little appetite.  He denies chest pain or abdominal pain.  Assessment & Plan:  COVID Pneumonia Clinically appears to be tolerating relatively well at this time but the patient reports feeling much worse than he looks and this is worrisome -continue to monitor -no indication at this time for Remdesivir - repeat chest x-ray in a.m.  Recent Labs    11/03/18 1428 11/03/18 1830 11/04/18 0215  DDIMER 0.69*  --  0.57*  FERRITIN  --  222  --   LDH  --  335*  --   CRP  --  10.3* 10.9*    CKD stage III Baseline creatinine 1.57 6 months ago -renal function has been stable since admission -continue to monitor trend  Recent Labs  Lab 11/03/18 1429 11/04/18 0215  CREATININE 1.78* 1.63*    DM 2 with neuropathy CBG reasonably well controlled at this time -monitor trend  CAD Asymptomatic -continue home medications  PAD Asymptomatic presently -continue home medications  Possible CHF No TTE in old records -chest x-ray suggestive of possible pulmonary edema but  likely COVID pneumonitis -follow without diuresis for now -would benefit from TTE in nonacute setting  HTN Controlled at present  OSA Depend upon CPAP nightly  Obesity - Body mass index is 37.24 kg/m.   DVT prophylaxis: Subcutaneous heparin Code Status: FULL CODE Family Communication:  Disposition Plan: Monitor respiratory status -ambulate -follow oxygen saturations  Consultants:  none  Antimicrobials:  None  Objective: Blood pressure 127/76, pulse 83, temperature 100 F (37.8 C), temperature source Oral, resp. rate 20, height 5\' 11"  (1.803 m), weight 121.1 kg, SpO2 93 %.  Intake/Output Summary (Last 24 hours) at 11/04/2018 0854 Last data filed at 11/04/2018 0840 Gross per 24 hour  Intake -  Output 950 ml  Net -950 ml   Filed Weights   11/03/18 1320 11/03/18 1710  Weight: 117.9 kg 121.1 kg    Examination: General: No acute respiratory distress -appears ill Lungs: Poor air movement bilateral bases with no wheezing or focal crackles appreciable Cardiovascular: Regular rate and rhythm without murmur gallop or rub normal S1 and S2 Abdomen: Nontender, obese, soft, bowel sounds positive, no rebound, no ascites, no appreciable mass Extremities: No significant cyanosis, clubbing, or edema bilateral lower extremities  CBC: Recent Labs  Lab 11/03/18 1429 11/04/18 0215  WBC 4.8 4.0  NEUTROABS 2.8 2.0  HGB 13.8 14.6  HCT 42.1 43.2  MCV 93.3 92.1  PLT 161 062   Basic Metabolic Panel: Recent Labs  Lab 11/03/18 1429 11/04/18 0215  NA 135  136  K 4.2 4.2  CL 100 101  CO2 27 27  GLUCOSE 111* 118*  BUN 23 22  CREATININE 1.78* 1.63*  CALCIUM 8.3* 8.5*   GFR: Estimated Creatinine Clearance: 51 mL/min (A) (by C-G formula based on SCr of 1.63 mg/dL (H)).  Liver Function Tests: Recent Labs  Lab 11/03/18 1429 11/04/18 0215  AST 55* 58*  ALT 42 41  ALKPHOS 77 76  BILITOT 0.4 0.3  PROT 6.7 6.8  ALBUMIN 3.5 3.4*    HbA1C: Hgb A1c MFr Bld  Date/Time Value Ref  Range Status  04/28/2018 03:00 PM 6.7 (H) 4.8 - 5.6 % Final    Comment:    (NOTE) Pre diabetes:          5.7%-6.4% Diabetes:              >6.4% Glycemic control for   <7.0% adults with diabetes     CBG: Recent Labs  Lab 11/03/18 1439 11/03/18 2051 11/04/18 0757  GLUCAP 105* 129* 120*    Recent Results (from the past 240 hour(s))  SARS Coronavirus 2 (CEPHEID- Performed in [place] hospital lab), Hosp Order     Status: Abnormal   Collection Time: 11/03/18  2:29 PM   Specimen: Nasopharyngeal Swab  Result Value Ref Range Status   SARS Coronavirus 2 POSITIVE (A) NEGATIVE Final    Comment: RESULT CALLED TO, READ BACK BY AND VERIFIED WITH: ZULETA,C AT 1600 ON 11/03/2018 BY JPM (NOTE) If result is NEGATIVE SARS-CoV-2 target nucleic acids are NOT DETECTED. The SARS-CoV-2 RNA is generally detectable in upper and lower  respiratory specimens during the acute phase of infection. The lowest  concentration of SARS-CoV-2 viral copies this assay can detect is 250  copies / mL. A negative result does not preclude SARS-CoV-2 infection  and should not be used as the sole basis for treatment or other  patient management decisions.  A negative result may occur with  improper specimen collection / handling, submission of specimen other  than nasopharyngeal swab, presence of viral mutation(s) within the  areas targeted by this assay, and inadequate number of viral copies  (<250 copies / mL). A negative result must be combined with clinical  observations, patient history, and epidemiological information. If result is POSITIVE SARS-CoV-2 target nucleic acids are DETECTED. T he SARS-CoV-2 RNA is generally detectable in upper and lower  respiratory specimens during the acute phase of infection.  Positive  results are indicative of active infection with SARS-CoV-2.  Clinical  correlation with patient history and other diagnostic information is  necessary to determine patient infection status.   Positive results do  not rule out bacterial infection or co-infection with other viruses. If result is PRESUMPTIVE POSTIVE SARS-CoV-2 nucleic acids MAY BE PRESENT.   A presumptive positive result was obtained on the submitted specimen  and confirmed on repeat testing.  While 2019 novel coronavirus  (SARS-CoV-2) nucleic acids may be present in the submitted sample  additional confirmatory testing may be necessary for epidemiological  and / or clinical management purposes  to differentiate between  SARS-CoV-2 and other Sarbecovirus currently known to infect humans.  If clinically indicated additional testing with an alternate test  methodology [phone]) is  advised. The SARS-CoV-2 RNA is generally  detectable in upper and lower respiratory specimens during the acute  phase of infection. The expected result is Negative. Fact Sheet for Patients:  StrictlyIdeas.no Fact Sheet for Healthcare Providers: BankingDealers.co.za This test is not yet approved or cleared by the [place]  States FDA and has been authorized for detection and/or diagnosis of SARS-CoV-2 by FDA under an Emergency Use Authorization (EUA).  This EUA will remain in effect (meaning this test can be used) for the duration of the COVID-19 declaration under Section 564(b)(1) of the Act, 21 U.S.C. section 360bbb-3(b)(1), unless the authorization is terminated or revoked sooner. Performed at [facility], [place] [address] [place], [place] [address]      Scheduled Meds: . aspirin  81 mg Oral QHS  . brimonidine  1 drop Both Eyes BID   And  . timolol  1 drop Both Eyes BID  . cilostazol  100 mg Oral BID  . famotidine  20 mg Oral QHS  . furosemide  40 mg Oral Daily  . gabapentin  600 mg Oral TID  . heparin  5,000 Units Subcutaneous Q8H  . insulin aspart  0-5 Units Subcutaneous QHS  . insulin aspart  0-9 Units Subcutaneous TID WC  . insulin glargine  30 Units  Subcutaneous QHS  . irbesartan  150 mg Oral Daily  . latanoprost  1 drop Both Eyes QHS  . oxybutynin  5 mg Oral BID  . sodium chloride flush  3 mL Intravenous Q12H  . tamsulosin  0.4 mg Oral Daily     LOS: 0 days   Cherene Altes, MD [facility] Office  [phone] Pager - Text Page per Shea Evans  If 7PM-7AM, please contact night-coverage per Amion 11/04/2018, 8:54 AM

## 2018-11-04 NOTE — Progress Notes (Addendum)
This nurse entered pts room, and in the process of walking to the monitor to take vitals, this nurse found a cup full of medications spilled on the pts table. This nurse picked up the pills, there were 5 of them and discarded of them in the sharps container. This nurse wrote down the numbers on the pills and colors to look them up and identify the pills the pt did not take, will update this note. According to Pill [employer] and using the serial numbers from the pills, this nurse was able to identify, the pills as gabapentin, cilostazol, isosorbide, and oxybutin, which are all medications that the pt is currently prescribed here in the hospital.

## 2018-11-04 NOTE — Evaluation (Signed)
Occupational Therapy Evaluation Patient Details Name: Andrew Larsen MRN: [id number] DOB: 06-11-1940 Today's Date: 11/04/2018    History of Present Illness BRICYN LABRADA is a 78 y.o. male with medical history significant of PAD, DM, HTN, BPH, OSA who presents due to SOB and nonproductive cough. Positive for Covid .  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 Reported multiple falls.   Clinical Impression   PTA, pt was living with his daughter and was performing BADLs and using cane/RW for functional mobility. Pt currently requiring Mod A for LB ADLs and Mod A +2 for functional transfers with RW. Pt presenting with fatigue, poor balance, and decreased strength. Pt would benefit from further acute OT to facilitate safe dc. Recommend dc to home with HHOT for further OT to optimize safety, independence with ADLs, and return to PLOF.      Follow Up Recommendations  Home health OT;Supervision/Assistance - 24 hour    Equipment Recommendations  3 in 1 bedside commode;Tub/shower seat    Recommendations for Other Services PT consult     Precautions / Restrictions Precautions Precautions: Fall Precaution Comments: right LE is much weaker than left and may buckle Restrictions Weight Bearing Restrictions: No      Mobility Bed Mobility Overal bed mobility: Needs Assistance Bed Mobility: Supine to Sit     Supine to sit: Min assist     General bed mobility comments: extra time, HOB raised  Transfers Overall transfer level: Needs assistance Equipment used: Rolling walker (2 wheeled) Transfers: Sit to/from [employer] Sit to Stand: Mod assist;+2 safety/equipment;From elevated surface Stand pivot transfers: Mod assist;+2 safety/equipment       General transfer comment: bed raised  for standing up, Cues to power up with UE's from bed.Steady assist to power up and  transition hands to RW.  Noted steppage on R foot during stepping  to recliner, right knee hyperexteds. Decreased control of descent to lower surface of recliner.    Balance Overall balance assessment: Needs assistance Sitting-balance support: Bilateral upper extremity supported;Feet supported Sitting balance-Leahy Scale: Fair     Standing balance support: Bilateral upper extremity supported;During functional activity Standing balance-Leahy Scale: Poor Standing balance comment: Reliant on UE support                           ADL either performed or assessed with clinical judgement   ADL Overall ADL's : Needs assistance/impaired Eating/Feeding: Independent;Sitting   Grooming: Supervision/safety;Set up;Sitting   Upper Body Bathing: Set up;Supervision/ safety;Sitting   Lower Body Bathing: Moderate assistance;Sit to/from stand   Upper Body Dressing : Set up;Supervision/safety;Sitting   Lower Body Dressing: Moderate assistance;Sit to/from stand   Toilet Transfer: Moderate assistance;RW;Stand-pivot;+2 for physical assistance(simulated to recliner) Toilet Transfer Details (indicate cue type and reason): Mod A for power up into standing and then maintain balance during pivot         Functional mobility during ADLs: Moderate assistance;+2 for physical assistance;Rolling walker(stand pivot only) General ADL Comments: Pt demonstrating decreased balance, strength, and activity tolerance. Pt fatigues quickly     Vision Baseline Vision/History: Wears glasses Wears Glasses: At all times Patient Visual Report: No change from baseline       Perception     Praxis      Pertinent Vitals/Pain Pain Assessment: Faces Faces Pain Scale: Hurts little more Pain Location: chest from coughing Pain Descriptors / Indicators: Discomfort Pain  Intervention(s): Monitored during session;Repositioned     Hand Dominance Right   Extremity/Trunk Assessment Upper Extremity  Assessment Upper Extremity Assessment: Generalized weakness   Lower Extremity Assessment Lower Extremity Assessment: Defer to PT evaluation RLE Deficits / Details: hip flexion 2+/5, knee ext 2/5, knee flexion 3/5, dorsiflex 3//5 RLE Sensation: history of peripheral neuropathy LLE Deficits / Details: general weakness, at least 4/5 LLE Sensation: history of peripheral neuropathy   Cervical / Trunk Assessment Cervical / Trunk Assessment: Other exceptions Cervical / Trunk Exceptions: some trunk weakness for getting to full sitting, had to push up with  arms and bed rail   Communication Communication Communication: No difficulties   Cognition Arousal/Alertness: Awake/alert Behavior During Therapy: WFL for tasks assessed/performed;Flat affect Overall Cognitive Status: Within Functional Limits for tasks assessed                                     General Comments  SpO2 >90% on RA; noting SOB and fatigue.     Exercises     Shoulder Instructions      Home Living Family/patient expects to be discharged to:: Private residence Living Arrangements: Children Available Help at Discharge: Family;Available PRN/intermittently Type of Home: House Home Access: Stairs to enter [employer] of Steps: pt reports steps in garage were modified, not specific as to what was done, ? ramp   Home Layout: One level     Bathroom Shower/Tub: Teacher, early years/pre: Standard     Home Equipment: Environmental consultant - 2 wheels;Cane - single point          Prior Functioning/Environment Level of Independence: Needs assistance  Gait / Transfers Assistance Needed: ambulates with a cane or RW. ADL's / Homemaking Assistance Needed: Reports he performs ADLs independently Communication / Swallowing Assistance Needed: wfl          OT Problem List: Decreased strength;Decreased range of motion;Decreased activity tolerance;Impaired balance (sitting and/or standing);Decreased  knowledge of use of DME or AE;Decreased knowledge of precautions      OT Treatment/Interventions: Self-care/ADL training;Therapeutic exercise;Energy conservation;DME and/or AE instruction;Therapeutic activities;Patient/family education    OT Goals(Current goals can be found in the care plan section) Acute Rehab OT Goals Patient Stated Goal: to feel better OT Goal Formulation: With patient Time For Goal Achievement: 11/18/18 Potential to Achieve Goals: Good  OT Frequency: Min 3X/week   Barriers to D/C:            Co-evaluation PT/OT/SLP Co-Evaluation/Treatment: Yes Reason for Co-Treatment: For patient/therapist safety;To address functional/ADL transfers PT goals addressed during session: Mobility/safety with mobility OT goals addressed during session: ADL's and self-care      AM-PAC OT "6 Clicks" Daily Activity     Outcome Measure Help from another person eating meals?: None Help from another person taking care of personal grooming?: A Little Help from another person toileting, which includes using toliet, bedpan, or urinal?: A Lot Help from another person bathing (including washing, rinsing, drying)?: A Lot Help from another person to put on and taking off regular upper body clothing?: A Little Help from another person to put on and taking off regular lower body clothing?: A Lot 6 Click Score: 16   End of Session Equipment Utilized During Treatment: Gait belt;Rolling walker Nurse Communication: Mobility status  Activity Tolerance: Patient limited by fatigue Patient left: in chair;with call bell/phone within reach;with chair alarm set  OT Visit Diagnosis: Unsteadiness on feet (R26.81);Other abnormalities  of gait and mobility (R26.89);Muscle weakness (generalized) (M62.81)                Time: 0148-4039 OT Time Calculation (min): 19 min Charges:  OT General Charges $OT Visit: 1 Visit OT Evaluation $OT Eval Moderate Complexity: [place], OTR/L Acute  Rehab Pager: [phone] Office: [place] 11/04/2018, 4:55 PM

## 2018-11-04 NOTE — Progress Notes (Signed)
SPOKE WITH [name] (PTS DAUGHTER) regarding plan of care and update on how he did in the overnight. Answered all her questions.

## 2018-11-04 NOTE — Evaluation (Signed)
Physical Therapy Evaluation Patient Details Name: Andrew Larsen MRN: [id number] DOB: 03-Nov-1940 Today's Date: 11/04/2018   History of Present Illness  SYLVAIN HASTEN is a 78 y.o. male with medical history significant of PAD, DM, HTN, BPH, OSA who presents due to SOB and nonproductive cough. Positive for Covid .  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 Reported multiple falls.  Clinical Impression  The patient presents with significant weakness of the right  LE, decreased stability for ambulation requiring RW. Per history patient being followed  For  Myopathy and  Neurogenic atrophy. Was to start OPPT soon. The patient has had several falls by his report. Pt admitted with above diagnosis. Pt currently with functional limitations due to the deficits listed below (see PT Problem List).  Pt will benefit from skilled PT to increase their independence and safety with mobility to allow discharge to the venue listed below.       Follow Up Recommendations Home health PT;Outpatient PT(was to start neuro rehab this month for LE weakness.)    Equipment Recommendations  None recommended by PT    Recommendations for Other Services       Precautions / Restrictions Precautions Precautions: Fall Precaution Comments: right LDE is much weaker than left and may buckle      Mobility  Bed Mobility Overal bed mobility: Needs Assistance Bed Mobility: Supine to Sit     Supine to sit: Min assist     General bed mobility comments: extra time, HOB raised  Transfers Overall transfer level: Needs assistance Equipment used: Rolling walker (2 wheeled) Transfers: Sit to/from [facility] Sit to Stand: Mod assist;+2 safety/equipment;From elevated surface Stand pivot transfers: +2 safety/equipment;Mod assist       General transfer comment: bed raised  for standing up, Cues to power  up with UE's from bed.Steady assist to power up and transition hands to RW.  Noted steppage on R foot during stepping  to recliner, right knee hyperexteds. Decreased control of descent to lower surface of recliner.  Ambulation/Gait                Stairs            Wheelchair Mobility    Modified Rankin (Stroke Patients Only)       Balance Overall balance assessment: Needs assistance Sitting-balance support: Bilateral upper extremity supported;Feet supported Sitting balance-Leahy Scale: Fair     Standing balance support: Bilateral upper extremity supported;During functional activity Standing balance-Leahy Scale: Poor Standing balance comment: relian on arms                             Pertinent Vitals/Pain Pain Assessment: Faces Faces Pain Scale: Hurts little more Pain Location: chest from coughing Pain Descriptors / Indicators: Discomfort Pain Intervention(s): Monitored during session(asking for cough meds)    Home Living Family/patient expects to be discharged to:: Private residence Living Arrangements: Children Available Help at Discharge: Family;Available PRN/intermittently Type of Home: House Home Access: Stairs to enter   [employer] of Steps: pt reports steps in garage were modified, not specific as to what was done, ? ramp Home Layout: One level Home Equipment: Walker - 2 wheels;Cane - single point      Prior Function Level of Independence: Needs assistance   Gait / Transfers Assistance Needed: ambulates with a cane or RW.  Hand Dominance        Extremity/Trunk Assessment        Lower Extremity Assessment Lower Extremity Assessment: RLE deficits/detail;LLE deficits/detail RLE Deficits / Details: hip flexion 2+/5, knee ext 2/5, knee flexion 3/5, dorsiflex 3//5 RLE Sensation: history of peripheral neuropathy LLE Deficits / Details: general weakness, at least 4/5 LLE Sensation: history of peripheral  neuropathy    Cervical / Trunk Assessment Cervical / Trunk Assessment: Other exceptions Cervical / Trunk Exceptions: some trunk weakness for getting to full sitting, had to push up with  arms and bed rail  Communication   Communication: No difficulties  Cognition Arousal/Alertness: Awake/alert Behavior During Therapy: WFL for tasks assessed/performed;Flat affect Overall Cognitive Status: Within Functional Limits for tasks assessed                                        General Comments      Exercises     Assessment/Plan    PT Assessment Patient needs continued PT services  PT Problem List Decreased strength;Decreased mobility;Decreased safety awareness;Decreased activity tolerance;Decreased balance;Decreased knowledge of use of DME;Decreased coordination;Decreased knowledge of precautions;Impaired sensation       PT Treatment Interventions DME instruction;Gait training;Functional mobility training;Therapeutic exercise;Therapeutic activities;Patient/family education    PT Goals (Current goals can be found in the Care Plan section)  Acute Rehab PT Goals Patient Stated Goal: to feel better PT Goal Formulation: With patient Time For Goal Achievement: 11/18/18 Potential to Achieve Goals: Good    Frequency Min 3X/week   Barriers to discharge        Co-evaluation PT/OT/SLP Co-Evaluation/Treatment: Yes Reason for Co-Treatment: For patient/therapist safety PT goals addressed during session: Mobility/safety with mobility OT goals addressed during session: ADL's and self-care       AM-PAC PT "6 Clicks" Mobility  Outcome Measure Help needed turning from your back to your side while in a flat bed without using bedrails?: A Little Help needed moving from lying on your back to sitting on the side of a flat bed without using bedrails?: A Little Help needed moving to and from a bed to a chair (including a wheelchair)?: A Lot Help needed standing up from a chair  using your arms (e.g., wheelchair or bedside chair)?: A Lot Help needed to walk in hospital room?: Total Help needed climbing 3-5 steps with a railing? : Total 6 Click Score: 12    End of Session Equipment Utilized During Treatment: Gait belt Activity Tolerance: Patient limited by fatigue Patient left: in chair;with call bell/phone within reach;with chair alarm set Nurse Communication: Mobility status PT Visit Diagnosis: Unsteadiness on feet (R26.81);Muscle weakness (generalized) (M62.81);Repeated falls (R29.6)    Time: 1638-4665 PT Time Calculation (min) (ACUTE ONLY): 20 min   Charges:   PT Evaluation $PT Eval Low Complexity: [place] PT Acute Rehabilitation Services Pager [phone] Office [phone]   Claretha Cooper 11/04/2018, 4:09 PM

## 2018-11-05 ENCOUNTER — Observation Stay (HOSPITAL_COMMUNITY): Payer: Medicare HMO

## 2018-11-05 DIAGNOSIS — K219 Gastro-esophageal reflux disease without esophagitis: Secondary | ICD-10-CM | POA: Diagnosis present

## 2018-11-05 DIAGNOSIS — E1151 Type 2 diabetes mellitus with diabetic peripheral angiopathy without gangrene: Secondary | ICD-10-CM | POA: Diagnosis present

## 2018-11-05 DIAGNOSIS — E871 Hypo-osmolality and hyponatremia: Secondary | ICD-10-CM | POA: Diagnosis not present

## 2018-11-05 DIAGNOSIS — I130 Hypertensive heart and chronic kidney disease with heart failure and stage 1 through stage 4 chronic kidney disease, or unspecified chronic kidney disease: Secondary | ICD-10-CM | POA: Diagnosis present

## 2018-11-05 DIAGNOSIS — J9611 Chronic respiratory failure with hypoxia: Secondary | ICD-10-CM | POA: Diagnosis not present

## 2018-11-05 DIAGNOSIS — R7309 Other abnormal glucose: Secondary | ICD-10-CM | POA: Diagnosis not present

## 2018-11-05 DIAGNOSIS — R040 Epistaxis: Secondary | ICD-10-CM | POA: Diagnosis not present

## 2018-11-05 DIAGNOSIS — E875 Hyperkalemia: Secondary | ICD-10-CM | POA: Diagnosis not present

## 2018-11-05 DIAGNOSIS — Z452 Encounter for adjustment and management of vascular access device: Secondary | ICD-10-CM | POA: Diagnosis not present

## 2018-11-05 DIAGNOSIS — R609 Edema, unspecified: Secondary | ICD-10-CM | POA: Diagnosis not present

## 2018-11-05 DIAGNOSIS — D696 Thrombocytopenia, unspecified: Secondary | ICD-10-CM | POA: Diagnosis not present

## 2018-11-05 DIAGNOSIS — J9601 Acute respiratory failure with hypoxia: Secondary | ICD-10-CM | POA: Diagnosis present

## 2018-11-05 DIAGNOSIS — Z978 Presence of other specified devices: Secondary | ICD-10-CM | POA: Diagnosis not present

## 2018-11-05 DIAGNOSIS — J988 Other specified respiratory disorders: Secondary | ICD-10-CM | POA: Diagnosis not present

## 2018-11-05 DIAGNOSIS — N400 Enlarged prostate without lower urinary tract symptoms: Secondary | ICD-10-CM | POA: Diagnosis present

## 2018-11-05 DIAGNOSIS — L89152 Pressure ulcer of sacral region, stage 2: Secondary | ICD-10-CM | POA: Diagnosis not present

## 2018-11-05 DIAGNOSIS — M7989 Other specified soft tissue disorders: Secondary | ICD-10-CM | POA: Diagnosis not present

## 2018-11-05 DIAGNOSIS — G7241 Inclusion body myositis [IBM]: Secondary | ICD-10-CM | POA: Diagnosis not present

## 2018-11-05 DIAGNOSIS — E1100 Type 2 diabetes mellitus with hyperosmolarity without nonketotic hyperglycemic-hyperosmolar coma (NKHHC): Secondary | ICD-10-CM | POA: Diagnosis not present

## 2018-11-05 DIAGNOSIS — Z7982 Long term (current) use of aspirin: Secondary | ICD-10-CM | POA: Diagnosis not present

## 2018-11-05 DIAGNOSIS — J984 Other disorders of lung: Secondary | ICD-10-CM | POA: Diagnosis not present

## 2018-11-05 DIAGNOSIS — I10 Essential (primary) hypertension: Secondary | ICD-10-CM | POA: Diagnosis not present

## 2018-11-05 DIAGNOSIS — I5032 Chronic diastolic (congestive) heart failure: Secondary | ICD-10-CM | POA: Diagnosis present

## 2018-11-05 DIAGNOSIS — J1289 Other viral pneumonia: Secondary | ICD-10-CM | POA: Diagnosis present

## 2018-11-05 DIAGNOSIS — R000 Tachycardia, unspecified: Secondary | ICD-10-CM | POA: Diagnosis not present

## 2018-11-05 DIAGNOSIS — R0602 Shortness of breath: Secondary | ICD-10-CM | POA: Diagnosis not present

## 2018-11-05 DIAGNOSIS — I2510 Atherosclerotic heart disease of native coronary artery without angina pectoris: Secondary | ICD-10-CM | POA: Diagnosis present

## 2018-11-05 DIAGNOSIS — N183 Chronic kidney disease, stage 3 (moderate): Secondary | ICD-10-CM | POA: Diagnosis present

## 2018-11-05 DIAGNOSIS — Z8249 Family history of ischemic heart disease and other diseases of the circulatory system: Secondary | ICD-10-CM | POA: Diagnosis not present

## 2018-11-05 DIAGNOSIS — Z833 Family history of diabetes mellitus: Secondary | ICD-10-CM | POA: Diagnosis not present

## 2018-11-05 DIAGNOSIS — E1122 Type 2 diabetes mellitus with diabetic chronic kidney disease: Secondary | ICD-10-CM | POA: Diagnosis not present

## 2018-11-05 DIAGNOSIS — Z794 Long term (current) use of insulin: Secondary | ICD-10-CM | POA: Diagnosis not present

## 2018-11-05 DIAGNOSIS — J90 Pleural effusion, not elsewhere classified: Secondary | ICD-10-CM | POA: Diagnosis not present

## 2018-11-05 DIAGNOSIS — G4733 Obstructive sleep apnea (adult) (pediatric): Secondary | ICD-10-CM | POA: Diagnosis present

## 2018-11-05 DIAGNOSIS — I739 Peripheral vascular disease, unspecified: Secondary | ICD-10-CM | POA: Diagnosis not present

## 2018-11-05 DIAGNOSIS — I129 Hypertensive chronic kidney disease with stage 1 through stage 4 chronic kidney disease, or unspecified chronic kidney disease: Secondary | ICD-10-CM | POA: Diagnosis not present

## 2018-11-05 DIAGNOSIS — E669 Obesity, unspecified: Secondary | ICD-10-CM | POA: Diagnosis not present

## 2018-11-05 DIAGNOSIS — R5381 Other malaise: Secondary | ICD-10-CM | POA: Diagnosis not present

## 2018-11-05 DIAGNOSIS — E7800 Pure hypercholesterolemia, unspecified: Secondary | ICD-10-CM | POA: Diagnosis present

## 2018-11-05 DIAGNOSIS — E46 Unspecified protein-calorie malnutrition: Secondary | ICD-10-CM | POA: Diagnosis not present

## 2018-11-05 DIAGNOSIS — E785 Hyperlipidemia, unspecified: Secondary | ICD-10-CM | POA: Diagnosis present

## 2018-11-05 DIAGNOSIS — E118 Type 2 diabetes mellitus with unspecified complications: Secondary | ICD-10-CM | POA: Diagnosis not present

## 2018-11-05 DIAGNOSIS — R918 Other nonspecific abnormal finding of lung field: Secondary | ICD-10-CM | POA: Diagnosis not present

## 2018-11-05 DIAGNOSIS — U071 COVID-19: Secondary | ICD-10-CM

## 2018-11-05 DIAGNOSIS — J069 Acute upper respiratory infection, unspecified: Secondary | ICD-10-CM

## 2018-11-05 DIAGNOSIS — Z87891 Personal history of nicotine dependence: Secondary | ICD-10-CM | POA: Diagnosis not present

## 2018-11-05 DIAGNOSIS — G4730 Sleep apnea, unspecified: Secondary | ICD-10-CM | POA: Diagnosis present

## 2018-11-05 DIAGNOSIS — E1165 Type 2 diabetes mellitus with hyperglycemia: Secondary | ICD-10-CM | POA: Diagnosis not present

## 2018-11-05 DIAGNOSIS — R05 Cough: Secondary | ICD-10-CM | POA: Diagnosis present

## 2018-11-05 DIAGNOSIS — E1169 Type 2 diabetes mellitus with other specified complication: Secondary | ICD-10-CM | POA: Diagnosis not present

## 2018-11-05 DIAGNOSIS — Z9981 Dependence on supplemental oxygen: Secondary | ICD-10-CM | POA: Diagnosis not present

## 2018-11-05 DIAGNOSIS — B948 Sequelae of other specified infectious and parasitic diseases: Secondary | ICD-10-CM | POA: Diagnosis not present

## 2018-11-05 DIAGNOSIS — D72819 Decreased white blood cell count, unspecified: Secondary | ICD-10-CM | POA: Diagnosis not present

## 2018-11-05 DIAGNOSIS — E0841 Diabetes mellitus due to underlying condition with diabetic mononeuropathy: Secondary | ICD-10-CM | POA: Diagnosis not present

## 2018-11-05 DIAGNOSIS — R350 Frequency of micturition: Secondary | ICD-10-CM | POA: Diagnosis not present

## 2018-11-05 DIAGNOSIS — R0902 Hypoxemia: Secondary | ICD-10-CM | POA: Diagnosis not present

## 2018-11-05 DIAGNOSIS — I517 Cardiomegaly: Secondary | ICD-10-CM | POA: Diagnosis not present

## 2018-11-05 LAB — CBC WITH DIFFERENTIAL/PLATELET
Abs Immature Granulocytes: 0.02 10*3/uL (ref 0.00–0.07)
Basophils Absolute: 0 10*3/uL (ref 0.0–0.1)
Basophils Relative: 0 %
Eosinophils Absolute: 0 10*3/uL (ref 0.0–0.5)
Eosinophils Relative: 0 %
HCT: 41.9 % (ref 39.0–52.0)
Hemoglobin: 14.1 g/dL (ref 13.0–17.0)
Immature Granulocytes: 0 %
Lymphocytes Relative: 30 %
Lymphs Abs: 1.6 10*3/uL (ref 0.7–4.0)
MCH: 31.1 pg (ref 26.0–34.0)
MCHC: 33.7 g/dL (ref 30.0–36.0)
MCV: 92.3 fL (ref 80.0–100.0)
Monocytes Absolute: 0.7 10*3/uL (ref 0.1–1.0)
Monocytes Relative: 14 %
Neutro Abs: 2.9 10*3/uL (ref 1.7–7.7)
Neutrophils Relative %: 56 %
Platelets: 151 10*3/uL (ref 150–400)
RBC: 4.54 MIL/uL (ref 4.22–5.81)
RDW: 13.5 % (ref 11.5–15.5)
WBC: 5.3 10*3/uL (ref 4.0–10.5)
nRBC: 0 % (ref 0.0–0.2)

## 2018-11-05 LAB — COMPREHENSIVE METABOLIC PANEL
ALT: 42 U/L (ref 0–44)
AST: 67 U/L — ABNORMAL HIGH (ref 15–41)
Albumin: 3.2 g/dL — ABNORMAL LOW (ref 3.5–5.0)
Alkaline Phosphatase: 67 U/L (ref 38–126)
Anion gap: 11 (ref 5–15)
BUN: 24 mg/dL — ABNORMAL HIGH (ref 8–23)
CO2: 26 mmol/L (ref 22–32)
Calcium: 8.3 mg/dL — ABNORMAL LOW (ref 8.9–10.3)
Chloride: 99 mmol/L (ref 98–111)
Creatinine, Ser: 1.64 mg/dL — ABNORMAL HIGH (ref 0.61–1.24)
GFR calc Af Amer: 46 mL/min — ABNORMAL LOW (ref >60)
GFR calc non Af Amer: 40 mL/min — ABNORMAL LOW (ref >60)
Glucose, Bld: 124 mg/dL — ABNORMAL HIGH (ref 70–99)
Potassium: 3.8 mmol/L (ref 3.5–5.1)
Sodium: 136 mmol/L (ref 135–145)
Total Bilirubin: 0.4 mg/dL (ref 0.3–1.2)
Total Protein: 6.4 g/dL — ABNORMAL LOW (ref 6.5–8.1)

## 2018-11-05 LAB — GLUCOSE, CAPILLARY
Glucose-Capillary: 118 mg/dL — ABNORMAL HIGH (ref 70–99)
Glucose-Capillary: 175 mg/dL — ABNORMAL HIGH (ref 70–99)
Glucose-Capillary: 196 mg/dL — ABNORMAL HIGH (ref 70–99)
Glucose-Capillary: 283 mg/dL — ABNORMAL HIGH (ref 70–99)

## 2018-11-05 LAB — D-DIMER, QUANTITATIVE: D-Dimer, Quant: 0.6 ug/mL-FEU — ABNORMAL HIGH (ref 0.00–0.50)

## 2018-11-05 LAB — C-REACTIVE PROTEIN: CRP: 15.3 mg/dL — ABNORMAL HIGH (ref <1.0)

## 2018-11-05 MED ORDER — METHYLPREDNISOLONE SODIUM SUCC 125 MG IJ SOLR
80.0000 mg | Freq: Two times a day (BID) | INTRAMUSCULAR | Status: DC
  Administered 2018-11-05 – 2018-11-06 (×3): 80 mg via INTRAVENOUS
  Filled 2018-11-05 (×3): qty 2

## 2018-11-05 NOTE — Progress Notes (Signed)
Called and spoke with daughter, [name], on speaker phone with patient joining in the call as well. [name] very appreciative for the update and knows that patient will be reevaluated for discharge again tomorrow. Patient given cough medicine and is resting in bed comfortably.

## 2018-11-05 NOTE — Discharge Instructions (Signed)
COVID-19 °COVID-19 is a respiratory infection that is caused by a virus called severe acute respiratory syndrome coronavirus 2 (SARS-CoV-2). The disease is also known as coronavirus disease or novel coronavirus. In some people, the virus may not cause any symptoms. In others, it may cause a serious infection. The infection can get worse quickly and can lead to complications, such as: °· Pneumonia, or infection of the lungs. °· Acute respiratory distress syndrome or ARDS. This is fluid build-up in the lungs. °· Acute respiratory failure. This is a condition in which there is not enough oxygen passing from the lungs to the body. °· Sepsis or septic shock. This is a serious bodily reaction to an infection. °· Blood clotting problems. °· Secondary infections due to bacteria or fungus. °The virus that causes COVID-19 is contagious. This means that it can spread from person to person through droplets from coughs and sneezes (respiratory secretions). °What are the causes? °This illness is caused by a virus. You may catch the virus by: °· Breathing in droplets from an infected person's cough or sneeze. °· Touching something, like a table or a doorknob, that was exposed to the virus (contaminated) and then touching your mouth, nose, or eyes. °What increases the risk? °Risk for infection °You are more likely to be infected with this virus if you: °· Live in or travel to an area with a COVID-19 outbreak. °· Come in contact with a sick person who recently traveled to an area with a COVID-19 outbreak. °· Provide care for or live with a person who is infected with COVID-19. °Risk for serious illness °You are more likely to become seriously ill from the virus if you: °· Are 65 years of age or older. °· Have a long-term disease that lowers your body's ability to fight infection (immunocompromised). °· Live in a nursing home or long-term care facility. °· Have a long-term (chronic) disease such as: °? Chronic lung disease, including  chronic obstructive pulmonary disease or asthma °? Heart disease. °? Diabetes. °? Chronic kidney disease. °? Liver disease. °· Are obese. °What are the signs or symptoms? °Symptoms of this condition can range from mild to severe. Symptoms may appear any time from 2 to 14 days after being exposed to the virus. They include: °· A fever. °· A cough. °· Difficulty breathing. °· Chills. °· Muscle pains. °· A sore throat. °· Loss of taste or smell. °Some people may also have stomach problems, such as nausea, vomiting, or diarrhea. °Other people may not have any symptoms of COVID-19. °How is this diagnosed? °This condition may be diagnosed based on: °· Your signs and symptoms, especially if: °? You live in an area with a COVID-19 outbreak. °? You recently traveled to or from an area where the virus is common. °? You provide care for or live with a person who was diagnosed with COVID-19. °· A physical exam. °· Lab tests, which may include: °? A nasal swab to take a sample of fluid from your nose. °? A throat swab to take a sample of fluid from your throat. °? A sample of mucus from your lungs (sputum). °? Blood tests. °· Imaging tests, which may include, X-rays, CT scan, or ultrasound. °How is this treated? °At present, there is no medicine to treat COVID-19. Medicines that treat other diseases are being used on a trial basis to see if they are effective against COVID-19. °Your health care provider will talk with you about ways to treat your symptoms. For most   people, the infection is mild and can be managed at home with rest, fluids, and over-the-counter medicines. °Treatment for a serious infection usually takes places in a hospital intensive care unit (ICU). It may include one or more of the following treatments. These treatments are given until your symptoms improve. °· Receiving fluids and medicines through an IV. °· Supplemental oxygen. Extra oxygen is given through a tube in the nose, a face mask, or a  hood. °· Positioning you to lie on your stomach (prone position). This makes it easier for oxygen to get into the lungs. °· Continuous positive airway pressure (CPAP) or bi-level positive airway pressure (BPAP) machine. This treatment uses mild air pressure to keep the airways open. A tube that is connected to a motor delivers oxygen to the body. °· Ventilator. This treatment moves air into and out of the lungs by using a tube that is placed in your windpipe. °· Tracheostomy. This is a procedure to create a hole in the neck so that a breathing tube can be inserted. °· Extracorporeal membrane oxygenation (ECMO). This procedure gives the lungs a chance to recover by taking over the functions of the heart and lungs. It supplies oxygen to the body and removes carbon dioxide. °Follow these instructions at home: °Lifestyle °· If you are sick, stay home except to get medical care. Your health care provider will tell you how long to stay home. Call your health care provider before you go for medical care. °· Rest at home as told by your health care provider. °· Do not use any products that contain nicotine or tobacco, such as cigarettes, e-cigarettes, and chewing tobacco. If you need help quitting, ask your health care provider. °· Return to your normal activities as told by your health care provider. Ask your health care provider what activities are safe for you. °General instructions °· Take over-the-counter and prescription medicines only as told by your health care provider. °· Drink enough fluid to keep your urine pale yellow. °· Keep all follow-up visits as told by your health care provider. This is important. °How is this prevented? ° °There is no vaccine to help prevent COVID-19 infection. However, there are steps you can take to protect yourself and others from this virus. °To protect yourself:  °· Do not travel to areas where COVID-19 is a risk. The areas where COVID-19 is reported change often. To identify  high-risk areas and travel restrictions, check the CDC travel website: wwwnc.cdc.gov/travel/notices °· If you live in, or must travel to, an area where COVID-19 is a risk, take precautions to avoid infection. °? Stay away from people who are sick. °? Wash your hands often with soap and water for 20 seconds. If soap and water are not available, use an alcohol-based hand sanitizer. °? Avoid touching your mouth, face, eyes, or nose. °? Avoid going out in public, follow guidance from your state and local health authorities. °? If you must go out in public, wear a cloth face covering or face mask. °? Disinfect objects and surfaces that are frequently touched every day. This may include: °§ Counters and tables. °§ Doorknobs and light switches. °§ Sinks and faucets. °§ Electronics, such as phones, remote controls, keyboards, computers, and tablets. °To protect others: °If you have symptoms of COVID-19, take steps to prevent the virus from spreading to others. °· If you think you have a COVID-19 infection, contact your health care provider right away. Tell your health care team that you think you may   have a COVID-19 infection. °· Stay home. Leave your house only to seek medical care. Do not use public transport. °· Do not travel while you are sick. °· Wash your hands often with soap and water for 20 seconds. If soap and water are not available, use alcohol-based hand sanitizer. °· Stay away from other members of your household. Let healthy household members care for children and pets, if possible. If you have to care for children or pets, wash your hands often and wear a mask. If possible, stay in your own room, separate from others. Use a different bathroom. °· Make sure that all people in your household wash their hands well and often. °· Cough or sneeze into a tissue or your sleeve or elbow. Do not cough or sneeze into your hand or into the air. °· Wear a cloth face covering or face mask. °Where to find more  information °· Centers for Disease Control and Prevention: www.cdc.gov/coronavirus/2019-ncov/index.html °· World Health Organization: www.who.int/health-topics/coronavirus °Contact a health care provider if: °· You live in or have traveled to an area where COVID-19 is a risk and you have symptoms of the infection. °· You have had contact with someone who has COVID-19 and you have symptoms of the infection. °Get help right away if: °· You have trouble breathing. °· You have pain or pressure in your chest. °· You have confusion. °· You have bluish lips and fingernails. °· You have difficulty waking from sleep. °· You have symptoms that get worse. °These symptoms may represent a serious problem that is an emergency. Do not wait to see if the symptoms will go away. Get medical help right away. Call your local emergency services (911 in the [place] Do not drive yourself to the hospital. Let the emergency medical personnel know if you think you have COVID-19. °Summary °· COVID-19 is a respiratory infection that is caused by a virus. It is also known as coronavirus disease or novel coronavirus. It can cause serious infections, such as pneumonia, acute respiratory distress syndrome, acute respiratory failure, or sepsis. °· The virus that causes COVID-19 is contagious. This means that it can spread from person to person through droplets from coughs and sneezes. °· You are more likely to develop a serious illness if you are 65 years of age or older, have a weak immunity, live in a nursing home, or have chronic disease. °· There is no medicine to treat COVID-19. Your health care provider will talk with you about ways to treat your symptoms. °· Take steps to protect yourself and others from infection. Wash your hands often and disinfect objects and surfaces that are frequently touched every day. Stay away from people who are sick and wear a mask if you are sick. °This information is not intended to replace advice given to you by  your health care provider. Make sure you discuss any questions you have with your health care provider. °Document Released: 05/22/2018 Document Revised: 09/11/2018 Document Reviewed: 05/22/2018 °Elsevier Patient Education © 2020 [employer] ° °COVID-19: How to Protect Yourself and Others °Know how it spreads °· There is currently no vaccine to prevent coronavirus disease 2019 (COVID-19). °· The best way to prevent illness is to avoid being exposed to this virus. °· The virus is thought to spread mainly from person-to-person. °? Between people who are in close contact with one another (within about 6 feet). °? Through respiratory droplets produced when an infected person coughs, sneezes or talks. °? These droplets can land in   the mouths or noses of people who are nearby or possibly be inhaled into the lungs. °? Some recent studies have suggested that COVID-19 may be spread by people who are not showing symptoms. °Everyone should °Clean your hands often °· Wash your hands often with soap and water for at least 20 seconds especially after you have been in a public place, or after blowing your nose, coughing, or sneezing. °· If soap and water are not readily available, use a hand sanitizer that contains at least 60% alcohol. Cover all surfaces of your hands and rub them together until they feel dry. °· Avoid touching your eyes, nose, and mouth with unwashed hands. °Avoid close contact °· Stay home if you are sick. °· Avoid close contact with people who are sick. °· Put distance between yourself and other people. °? Remember that some people without symptoms may be able to spread virus. °? This is especially important for people who are at higher risk of getting very sick.www.cdc.gov/coronavirus/2019-ncov/need-extra-precautions/people-at-higher-risk.html °Cover your mouth and nose with a cloth face cover when around others °· You could spread COVID-19 to others even if you do not feel sick. °· Everyone should wear a  cloth face cover when they have to go out in public, for example to the grocery store or to pick up other necessities. °? Cloth face coverings should not be placed on young children under age 2, anyone who has trouble breathing, or is unconscious, incapacitated or otherwise unable to remove the mask without assistance. °· The cloth face cover is meant to protect other people in case you are infected. °· Do NOT use a facemask meant for a healthcare worker. °· Continue to keep about 6 feet between yourself and others. The cloth face cover is not a substitute for social distancing. °Cover coughs and sneezes °· If you are in a private setting and do not have on your cloth face covering, remember to always cover your mouth and nose with a tissue when you cough or sneeze or use the inside of your elbow. °· Throw used tissues in the trash. °· Immediately wash your hands with soap and water for at least 20 seconds. If soap and water are not readily available, clean your hands with a hand sanitizer that contains at least 60% alcohol. °Clean and disinfect °· Clean AND disinfect frequently touched surfaces daily. This includes tables, doorknobs, light switches, countertops, handles, desks, phones, keyboards, toilets, faucets, and sinks. www.cdc.gov/coronavirus/2019-ncov/prevent-getting-sick/disinfecting-your-home.html °· If surfaces are dirty, clean them: Use detergent or soap and water prior to disinfection. °· Then, use a household disinfectant. You can see a list of EPA-registered household disinfectants here. °cdc.gov/coronavirus °09/02/2018 °This information is not intended to replace advice given to you by your health care provider. Make sure you discuss any questions you have with your health care provider. °Document Released: 08/12/2018 Document Revised: 09/10/2018 Document Reviewed: 08/12/2018 °Elsevier Patient Education © 2020 [employer] ° ° °COVID-19 Frequently Asked Questions °COVID-19 (coronavirus disease) is  an infection that is caused by a large family of viruses. Some viruses cause illness in people and others cause illness in animals like camels, cats, and bats. In some cases, the viruses that cause illness in animals can spread to humans. °Where did the coronavirus come from? °In December 2019, [place] told the World Health Organization (WHO) of several cases of lung disease (human respiratory illness). These cases were linked to an open seafood and livestock market in the city of [place]. The link to the seafood and   livestock market suggests that the virus may have spread from animals to humans. However, since that first outbreak in December, the virus has also been shown to spread from person to person. °What is the name of the disease and the virus? °Disease name °Early on, this disease was called novel coronavirus. This is because scientists determined that the disease was caused by a new (novel) respiratory virus. The World Health Organization (WHO) has now named the disease COVID-19, or coronavirus disease. °Virus name °The virus that causes the disease is called severe acute respiratory syndrome coronavirus 2 (SARS-CoV-2). °More information on disease and virus naming °World Health Organization (WHO): www.who.int/emergencies/diseases/novel-coronavirus-2019/technical-guidance/naming-the-coronavirus-disease-(covid-2019)-and-the-virus-that-causes-it °Who is at risk for complications from coronavirus disease? °Some people may be at higher risk for complications from coronavirus disease. This includes older adults and people who have chronic diseases, such as heart disease, diabetes, and lung disease. °If you are at higher risk for complications, take these extra precautions: °· Avoid close contact with people who are sick or have a fever or cough. Stay at least 3-6 ft (1-2 m) away from them, if possible. °· Wash your hands often with soap and water for at least 20 seconds. °· Avoid touching your face, mouth, nose, or  eyes. °· Keep supplies on hand at home, such as food, medicine, and cleaning supplies. °· Stay home as much as possible. °· Avoid social gatherings and travel. °How does coronavirus disease spread? °The virus that causes coronavirus disease spreads easily from person to person (is contagious). There are also cases of community-spread disease. This means the disease has spread to: °· People who have no known contact with other infected people. °· People who have not traveled to areas where there are known cases. °It appears to spread from one person to another through droplets from coughing or sneezing. °Can I get the virus from touching surfaces or objects? °There is still a lot that we do not know about the virus that causes coronavirus disease. Scientists are basing a lot of information on what they know about similar viruses, such as: °· Viruses cannot generally survive on surfaces for long. They need a human body (host) to survive. °· It is more likely that the virus is spread by close contact with people who are sick (direct contact), such as through: °? Shaking hands or hugging. °? Breathing in respiratory droplets that travel through the air. This can happen when an infected person coughs or sneezes on or near other people. °· It is less likely that the virus is spread when a person touches a surface or object that has the virus on it (indirect contact). The virus may be able to enter the body if the person touches a surface or object and then touches his or her face, eyes, nose, or mouth. °Can a person spread the virus without having symptoms of the disease? °It may be possible for the virus to spread before a person has symptoms of the disease, but this is most likely not the main way the virus is spreading. It is more likely for the virus to spread by being in close contact with people who are sick and breathing in the respiratory droplets of a sick person's cough or sneeze. °What are the symptoms of  coronavirus disease? °Symptoms vary from person to person and can range from mild to severe. Symptoms may include: °· Fever. °· Cough. °· Tiredness, weakness, or fatigue. °· Fast breathing or feeling short of breath. °These symptoms can appear anywhere from   2 to 14 days after you have been exposed to the virus. If you develop symptoms, call your health care provider. People with severe symptoms may need hospital care. °If I am exposed to the virus, how long does it take before symptoms start? °Symptoms of coronavirus disease may appear anywhere from 2 to 14 days after a person has been exposed to the virus. If you develop symptoms, call your health care provider. °Should I be tested for this virus? °Your health care provider will decide whether to test you based on your symptoms, history of exposure, and your risk factors. °How does a health care provider test for this virus? °Health care providers will collect samples to send for testing. Samples may include: °· Taking a swab of fluid from the nose. °· Taking fluid from the lungs by having you cough up mucus (sputum) into a sterile cup. °· Taking a blood sample. °· Taking a stool or urine sample. °Is there a treatment or vaccine for this virus? °Currently, there is no vaccine to prevent coronavirus disease. Also, there are no medicines like antibiotics or antivirals to treat the virus. A person who becomes sick is given supportive care, which means rest and fluids. A person may also relieve his or her symptoms by using over-the-counter medicines that treat sneezing, coughing, and runny nose. These are the same medicines that a person takes for the common cold. °If you develop symptoms, call your health care provider. People with severe symptoms may need hospital care. °What can I do to protect myself and my family from this virus? ° °  ° °You can protect yourself and your family by taking the same actions that you would take to prevent the spread of other viruses.  Take the following actions: °· Wash your hands often with soap and water for at least 20 seconds. If soap and water are not available, use alcohol-based hand sanitizer. °· Avoid touching your face, mouth, nose, or eyes. °· Cough or sneeze into a tissue, sleeve, or elbow. Do not cough or sneeze into your hand or the air. °? If you cough or sneeze into a tissue, throw it away immediately and wash your hands. °· Disinfect objects and surfaces that you frequently touch every day. °· Avoid close contact with people who are sick or have a fever or cough. Stay at least 3-6 ft (1-2 m) away from them, if possible. °· Stay home if you are sick, except to get medical care. Call your health care provider before you get medical care. °· Make sure your vaccines are up to date. Ask your health care provider what vaccines you need. °What should I do if I need to travel? °Follow travel recommendations from your local health authority, the CDC, and WHO. °Travel information and advice °· Centers for Disease Control and Prevention (CDC): www.cdc.gov/coronavirus/2019-ncov/travelers/index.html °· World Health Organization (WHO): www.who.int/emergencies/diseases/novel-coronavirus-2019/travel-advice °Know the risks and take action to protect your health °· You are at higher risk of getting coronavirus disease if you are traveling to areas with an outbreak or if you are exposed to travelers from areas with an outbreak. °· Wash your hands often and practice good hygiene to lower the risk of catching or spreading the virus. °What should I do if I am sick? °General instructions to stop the spread of infection °· Wash your hands often with soap and water for at least 20 seconds. If soap and water are not available, use alcohol-based hand sanitizer. °· Cough or sneeze into a   tissue, sleeve, or elbow. Do not cough or sneeze into your hand or the air. °· If you cough or sneeze into a tissue, throw it away immediately and wash your hands. °· Stay  home unless you must get medical care. Call your health care provider or local health authority before you get medical care. °· Avoid public areas. Do not take public transportation, if possible. °· If you can, wear a mask if you must go out of the house or if you are in close contact with someone who is not sick. °Keep your home clean °· Disinfect objects and surfaces that are frequently touched every day. This may include: °? Counters and tables. °? Doorknobs and light switches. °? Sinks and faucets. °? Electronics such as phones, remote controls, keyboards, computers, and tablets. °· Wash dishes in hot, soapy water or use a dishwasher. Air-dry your dishes. °· Wash laundry in hot water. °Prevent infecting other household members °· Let healthy household members care for children and pets, if possible. If you have to care for children or pets, wash your hands often and wear a mask. °· Sleep in a different bedroom or bed, if possible. °· Do not share personal items, such as razors, toothbrushes, deodorant, combs, brushes, towels, and washcloths. °Where to find more information °Centers for Disease Control and Prevention (CDC) °· Information and news updates: www.cdc.gov/coronavirus/2019-ncov °World Health Organization (WHO) °· Information and news updates: www.who.int/emergencies/diseases/novel-coronavirus-2019 °· Coronavirus health topic: www.who.int/health-topics/coronavirus °· Questions and answers on COVID-19: www.who.int/news-room/q-a-detail/q-a-coronaviruses °· Global tracker: who.sprinklr.com °American Academy of Pediatrics (AAP) °· Information for families: www.healthychildren.org/English/health-issues/conditions/chest-lungs/Pages/2019-Novel-Coronavirus.aspx °The coronavirus situation is changing rapidly. Check your local health authority website or the CDC and WHO websites for updates and news. °When should I contact a health care provider? °· Contact your health care provider if you have symptoms of an  infection, such as fever or cough, and you: °? Have been near anyone who is known to have coronavirus disease. °? Have come into contact with a person who is suspected to have coronavirus disease. °? Have traveled outside of the country. °When should I get emergency medical care? °· Get help right away by calling your local emergency services (911 in the [place] if you have: °? Trouble breathing. °? Pain or pressure in your chest. °? Confusion. °? Blue-tinged lips and fingernails. °? Difficulty waking from sleep. °? Symptoms that get worse. °Let the emergency medical personnel know if you think you have coronavirus disease. °Summary °· A new respiratory virus is spreading from person to person and causing COVID-19 (coronavirus disease). °· The virus that causes COVID-19 appears to spread easily. It spreads from one person to another through droplets from coughing or sneezing. °· Older adults and those with chronic diseases are at higher risk of disease. If you are at higher risk for complications, take extra precautions. °· There is currently no vaccine to prevent coronavirus disease. There are no medicines, such as antibiotics or antivirals, to treat the virus. °· You can protect yourself and your family by washing your hands often, avoiding touching your face, and covering your coughs and sneezes. °This information is not intended to replace advice given to you by your health care provider. Make sure you discuss any questions you have with your health care provider. °Document Released: 08/12/2018 Document Revised: 08/12/2018 Document Reviewed: 08/12/2018 °Elsevier Patient Education © 2020 Elsevier Inc. ° °

## 2018-11-05 NOTE — Plan of Care (Signed)

## 2018-11-05 NOTE — Progress Notes (Addendum)
PROGRESS NOTE  Andrew Larsen  ZOX:[id number] DOB: [birth year] DOA: 11/03/2018 PCP: Velna Hatchet, MD   Brief Narrative: Andrew Larsen is a 78 y.o. male with a history of T2DM, PAD, HTN, OSA, and BPH who presented with dry cough and shortness of breath with positive covid exposures. He tested positive for covid and had bilateral CXR infiltrates, admitted to [facility] and has developed hypoxia.  Assessment & Plan: Principal Problem:   COVID-19 virus infection Active Problems:   Morbid obesity ([place])   Claudication in peripheral vascular disease (HCC)   CKD (chronic kidney disease) stage 3, GFR 30-59 ml/min (HCC)   Type 2 diabetes mellitus ([place])   Essential hypertension   COVID-19  Acute hypoxic respiratory failure due to covid-19 pneumonia: Felt to be at risk of progression to ARDS based on current assessment. Comorbidities including age, HTN, DM, obesity.  - Continue airborne, contact precautions. PPE including surgical gown, gloves, cap, shoe covers, and CAPR used during this encounter in a negative pressure room.  - Check daily labs: CBC w/diff, CMP, d-dimer, ferritin, CRP - Maintain euvolemia/net negative.  - Avoid NSAIDs - Recommend proning and aggressive use of incentive spirometry. - Start steroids with plan to continue at least 7-10 days. Will hold remdesivir for now, but if hypoxia continues to progress, would start remdesivir. Will also trial lasix x1 and repeat ambulatory pulse oximetry tomorrow.  T2DM with neuropathy:  - Continue lantus, SSI. Monitor for steroid-induced hyperglycemia.   Stage III CKD:  - At baseline. Avoid nephrotoxins.  CAD: No angina.  - Continue home medications  PAD:  - Continue home medications including cilostazol  Possible CHF:  - Trial lasix   HTN:  - Continue current management  OSA:  - Can give HFNC qHS while admitted.  Obesity: BMI 37.  - Noted  DVT prophylaxis: Heparin Code Status: Full code Family Communication: None at bedside,  wants to update family himself Disposition Plan: Uncertain, but will continue inpatient management due to progressive hypoxia. He's at very high risk for decompensation.  Consultants:   None  Procedures:   None  Antimicrobials:  None   Subjective: Feels less muscle aches/weak today, but was short of breath with exertion today. Low grade fever. No chest pain. Weakness in RLE is stable, unchanged from chronically.   Objective: Vitals:   11/05/18 0342 11/05/18 0740 11/05/18 1125 11/05/18 1608  BP: 127/75 126/60 137/64 (!) 145/57  Pulse: 99 95 (!) 102 (!) 111  Resp: 17 (!) 21 19 (!) 23  Temp: 98.8 F (37.1 C) 99.3 F (37.4 C) 100.3 F (37.9 C) (!) 100.5 F (38.1 C)  TempSrc: Oral Oral Oral Oral  SpO2: 95% (!) 87% 96% 90%  Weight:      Height:        Intake/Output Summary (Last 24 hours) at 11/05/2018 1710 Last data filed at 11/05/2018 0800 Gross per 24 hour  Intake 1200 ml  Output 700 ml  Net 500 ml   Filed Weights   11/03/18 1320 11/03/18 1710  Weight: 117.9 kg 121.1 kg    Gen: 78 y.o. male in no distress Pulm: Non-labored breathing at rest. Clear to auscultation bilaterally.  CV: Regular rate and rhythm. No murmur, rub, or gallop. No JVD, no pedal edema. GI: Abdomen soft, non-tender, non-distended, with normoactive bowel sounds. No organomegaly or masses felt. Ext: Warm, no deformities Skin: No rashes, lesions or ulcers Neuro: Alert and oriented. Decreased strength R > L LE's. No focal neurological deficits. Psych: Judgement and insight  appear normal. Mood & affect appropriate.   Data Reviewed: I have personally reviewed following labs and imaging studies  CBC: Recent Labs  Lab 11/03/18 1429 11/04/18 0215 11/05/18 0330  WBC 4.8 4.0 5.3  NEUTROABS 2.8 2.0 2.9  HGB 13.8 14.6 14.1  HCT 42.1 43.2 41.9  MCV 93.3 92.1 92.3  PLT 161 157 196   Basic Metabolic Panel: Recent Labs  Lab 11/03/18 1429 11/04/18 0215 11/05/18 0330  NA 135 136 136  K 4.2 4.2  3.8  CL 100 101 99  CO2 27 27 26   GLUCOSE 111* 118* 124*  BUN 23 22 24*  CREATININE 1.78* 1.63* 1.64*  CALCIUM 8.3* 8.5* 8.3*   GFR: Estimated Creatinine Clearance: 50.7 mL/min (A) (by C-G formula based on SCr of 1.64 mg/dL (H)). Liver Function Tests: Recent Labs  Lab 11/03/18 1429 11/04/18 0215 11/05/18 0330  AST 55* 58* 67*  ALT 42 41 42  ALKPHOS 77 76 67  BILITOT 0.4 0.3 0.4  PROT 6.7 6.8 6.4*  ALBUMIN 3.5 3.4* 3.2*   No results for input(s): LIPASE, AMYLASE in the last 168 hours. No results for input(s): AMMONIA in the last 168 hours. Coagulation Profile: No results for input(s): INR, PROTIME in the last 168 hours. Cardiac Enzymes: No results for input(s): CKTOTAL, CKMB, CKMBINDEX, TROPONINI in the last 168 hours. BNP (last 3 results) No results for input(s): PROBNP in the last 8760 hours. HbA1C: No results for input(s): HGBA1C in the last 72 hours. CBG: Recent Labs  Lab 11/04/18 1151 11/04/18 1546 11/04/18 2020 11/05/18 0740 11/05/18 1126  GLUCAP 171* 200* 169* 118* 175*   Lipid Profile: Recent Labs    11/03/18 1428  TRIG 87   Thyroid Function Tests: No results for input(s): TSH, T4TOTAL, FREET4, T3FREE, THYROIDAB in the last 72 hours. Anemia Panel: Recent Labs    11/03/18 1830  FERRITIN 222   Urine analysis: No results found for: COLORURINE, APPEARANCEUR, LABSPEC, PHURINE, GLUCOSEU, HGBUR, BILIRUBINUR, KETONESUR, PROTEINUR, UROBILINOGEN, NITRITE, LEUKOCYTESUR Recent Results (from the past 240 hour(s))  Culture, blood (routine x 2)     Status: None (Preliminary result)   Collection Time: 11/03/18  2:29 PM   Specimen: BLOOD  Result Value Ref Range Status   Specimen Description   Final    BLOOD RIGHT ANTECUBITAL Performed at [facility] [address], [place], [place] [address]    Special Requests   Final    BOTTLES DRAWN AEROBIC AND ANAEROBIC Blood Culture adequate volume Performed at [facility] [address] [place], [place] [address]    Culture   Final    NO GROWTH 2 DAYS Performed at [facility] [address], [place], [place] [address]    Report Status PENDING  Incomplete  Culture, blood (routine x 2)     Status: None (Preliminary result)   Collection Time: 11/03/18  2:29 PM   Specimen: BLOOD RIGHT HAND  Result Value Ref Range Status   Specimen Description   Final    BLOOD RIGHT HAND Performed at [facility] [address] [place], [place] [address]    Special Requests   Final    BOTTLES DRAWN AEROBIC AND ANAEROBIC Blood Culture adequate volume Performed at [facility] [address], [place], [place] [address]    Culture   Final    NO GROWTH 2 DAYS Performed at [facility] [address] [place], [place] [address]    Report  Status PENDING  Incomplete  SARS Coronavirus 2 (CEPHEID- Performed in [place] hospital lab), Hosp Order     Status: Abnormal   Collection Time: 11/03/18  2:29 PM   Specimen: Nasopharyngeal Swab  Result Value Ref Range Status   SARS Coronavirus 2 POSITIVE (A) NEGATIVE Final    Comment: RESULT CALLED TO, READ BACK BY AND VERIFIED WITH: ZULETA,C AT 1600 ON 11/03/2018 BY JPM (NOTE) If result is NEGATIVE SARS-CoV-2 target nucleic acids are NOT DETECTED. The SARS-CoV-2 RNA is generally detectable in upper and lower  respiratory specimens during the acute phase of infection. The lowest  concentration of SARS-CoV-2 viral copies this assay can detect is 250  copies / mL. A negative result does not preclude SARS-CoV-2 infection  and should not be used as the sole basis for treatment or other  patient management decisions.  A negative result may occur with  improper specimen collection / handling, submission of specimen other  than nasopharyngeal swab, presence of viral mutation(s) within the  areas targeted by this assay, and inadequate number of viral copies  (<250 copies / mL). A negative  result must be combined with clinical  observations, patient history, and epidemiological information. If result is POSITIVE SARS-CoV-2 target nucleic acids are DETECTED. T he SARS-CoV-2 RNA is generally detectable in upper and lower  respiratory specimens during the acute phase of infection.  Positive  results are indicative of active infection with SARS-CoV-2.  Clinical  correlation with patient history and other diagnostic information is  necessary to determine patient infection status.  Positive results do  not rule out bacterial infection or co-infection with other viruses. If result is PRESUMPTIVE POSTIVE SARS-CoV-2 nucleic acids MAY BE PRESENT.   A presumptive positive result was obtained on the submitted specimen  and confirmed on repeat testing.  While 2019 novel coronavirus  (SARS-CoV-2) nucleic acids may be present in the submitted sample  additional confirmatory testing may be necessary for epidemiological  and / or clinical management purposes  to differentiate between  SARS-CoV-2 and other Sarbecovirus currently known to infect humans.  If clinically indicated additional testing with an alternate test  methodology [phone]) is  advised. The SARS-CoV-2 RNA is generally  detectable in upper and lower respiratory specimens during the acute  phase of infection. The expected result is Negative. Fact Sheet for Patients:  StrictlyIdeas.no Fact Sheet for Healthcare Providers: BankingDealers.co.za This test is not yet approved or cleared by the [place] FDA and has been authorized for detection and/or diagnosis of SARS-CoV-2 by FDA under an Emergency Use Authorization (EUA).  This EUA will remain in effect (meaning this test can be used) for the duration of the COVID-19 declaration under Section 564(b)(1) of the Act, 21 U.S.C. section 360bbb-3(b)(1), unless the authorization is terminated or revoked sooner. Performed at [facility], [place] [address], [place], [place] [address]       Radiology Studies: Dg Chest Port 1 View  Result Date: 11/05/2018 CLINICAL DATA:  Acute respiratory disease secondary to COVID-19. EXAM: PORTABLE CHEST 1 VIEW 5:11 a.m. COMPARISON:  11/03/2018 and 03/25/2016 FINDINGS: Faint bilateral pulmonary infiltrates appear slightly improved at the bases. Heart size and pulmonary vascularity are normal. No effusions. No bone abnormality. IMPRESSION: Slight improvement in faint bilateral pulmonary infiltrates. Electronically Signed   By: Lorriane Shire M.D.   On: 11/05/2018 05:50    Scheduled Meds: . aspirin  81 mg Oral QHS  . brimonidine  1 drop Both Eyes BID   And  .  timolol  1 drop Both Eyes BID  . cilostazol  100 mg Oral BID  . famotidine  20 mg Oral QHS  . furosemide  40 mg Oral Daily  . gabapentin  600 mg Oral TID  . heparin  5,000 Units Subcutaneous Q8H  . insulin aspart  0-5 Units Subcutaneous QHS  . insulin aspart  0-9 Units Subcutaneous TID WC  . insulin glargine  30 Units Subcutaneous QHS  . irbesartan  150 mg Oral Daily  . latanoprost  1 drop Both Eyes QHS  . methylPREDNISolone (SOLU-MEDROL) injection  80 mg Intravenous Q12H  . oxybutynin  5 mg Oral BID  . sodium chloride flush  3 mL Intravenous Q12H  . tamsulosin  0.4 mg Oral Daily   Continuous Infusions:   LOS: 0 days   Time spent: 25 minutes.  Patrecia Pour, MD [facility] [email or web address] Password [email or web address] 11/05/2018, 5:10 PM

## 2018-11-05 NOTE — Progress Notes (Signed)
SATURATION QUALIFICATIONS: (This note is used to comply with regulatory documentation for home oxygen)  Patient Saturations on Room Air at Rest = 94%  Patient Saturations on Room Air while Ambulating = 86%  Please briefly explain why patient needs home oxygen:  Pt walked half the loop around the floor, sats dropped to 86% with ambulation. Pt needed to stop to cough and his cough is strong and dry. He continues to cough and his sats dropped to 81%. Pt was walked back to his room and placed on 2L [place] and sats slowly increased back to 92%.

## 2018-11-06 DIAGNOSIS — E1100 Type 2 diabetes mellitus with hyperosmolarity without nonketotic hyperglycemic-hyperosmolar coma (NKHHC): Secondary | ICD-10-CM

## 2018-11-06 LAB — COMPREHENSIVE METABOLIC PANEL
ALT: 51 U/L — ABNORMAL HIGH (ref 0–44)
AST: 75 U/L — ABNORMAL HIGH (ref 15–41)
Albumin: 3.3 g/dL — ABNORMAL LOW (ref 3.5–5.0)
Alkaline Phosphatase: 73 U/L (ref 38–126)
Anion gap: 14 (ref 5–15)
BUN: 28 mg/dL — ABNORMAL HIGH (ref 8–23)
CO2: 23 mmol/L (ref 22–32)
Calcium: 8.2 mg/dL — ABNORMAL LOW (ref 8.9–10.3)
Chloride: 94 mmol/L — ABNORMAL LOW (ref 98–111)
Creatinine, Ser: 1.56 mg/dL — ABNORMAL HIGH (ref 0.61–1.24)
GFR calc Af Amer: 49 mL/min — ABNORMAL LOW (ref >60)
GFR calc non Af Amer: 43 mL/min — ABNORMAL LOW (ref >60)
Glucose, Bld: 285 mg/dL — ABNORMAL HIGH (ref 70–99)
Potassium: 4.2 mmol/L (ref 3.5–5.1)
Sodium: 131 mmol/L — ABNORMAL LOW (ref 135–145)
Total Bilirubin: 0.6 mg/dL (ref 0.3–1.2)
Total Protein: 6.9 g/dL (ref 6.5–8.1)

## 2018-11-06 LAB — GLUCOSE, CAPILLARY
Glucose-Capillary: 301 mg/dL — ABNORMAL HIGH (ref 70–99)
Glucose-Capillary: 385 mg/dL — ABNORMAL HIGH (ref 70–99)
Glucose-Capillary: 427 mg/dL — ABNORMAL HIGH (ref 70–99)
Glucose-Capillary: 396 mg/dL — ABNORMAL HIGH (ref 70–99)

## 2018-11-06 LAB — D-DIMER, QUANTITATIVE: D-Dimer, Quant: 0.5 ug/mL-FEU (ref 0.00–0.50)

## 2018-11-06 LAB — CBC WITH DIFFERENTIAL/PLATELET
Abs Immature Granulocytes: 0.03 10*3/uL (ref 0.00–0.07)
Basophils Absolute: 0 10*3/uL (ref 0.0–0.1)
Basophils Relative: 0 %
Eosinophils Absolute: 0 10*3/uL (ref 0.0–0.5)
Eosinophils Relative: 0 %
HCT: 41.7 % (ref 39.0–52.0)
Hemoglobin: 13.8 g/dL (ref 13.0–17.0)
Immature Granulocytes: 1 %
Lymphocytes Relative: 15 %
Lymphs Abs: 0.7 10*3/uL (ref 0.7–4.0)
MCH: 29.9 pg (ref 26.0–34.0)
MCHC: 33.1 g/dL (ref 30.0–36.0)
MCV: 90.5 fL (ref 80.0–100.0)
Monocytes Absolute: 0.2 10*3/uL (ref 0.1–1.0)
Monocytes Relative: 4 %
Neutro Abs: 3.8 10*3/uL (ref 1.7–7.7)
Neutrophils Relative %: 80 %
Platelets: 177 10*3/uL (ref 150–400)
RBC: 4.61 MIL/uL (ref 4.22–5.81)
RDW: 13.2 % (ref 11.5–15.5)
WBC: 4.7 10*3/uL (ref 4.0–10.5)
nRBC: 0 % (ref 0.0–0.2)

## 2018-11-06 LAB — C-REACTIVE PROTEIN: CRP: 19.6 mg/dL — ABNORMAL HIGH (ref <1.0)

## 2018-11-06 MED ORDER — METHYLPREDNISOLONE SODIUM SUCC 125 MG IJ SOLR: 80.0000 mg | Freq: Every day | INTRAMUSCULAR | Status: DC

## 2018-11-06 MED ORDER — INSULIN ASPART 100 UNIT/ML ~~LOC~~ SOLN
30.0000 [IU] | Freq: Once | SUBCUTANEOUS | Status: AC
  Administered 2018-11-06: 30 [IU] via SUBCUTANEOUS

## 2018-11-06 MED ORDER — INSULIN ASPART 100 UNIT/ML ~~LOC~~ SOLN
0.0000 [IU] | Freq: Three times a day (TID) | SUBCUTANEOUS | Status: DC
  Administered 2018-11-07: 8 [IU] via SUBCUTANEOUS
  Administered 2018-11-07: 11 [IU] via SUBCUTANEOUS

## 2018-11-06 NOTE — Progress Notes (Signed)
Inpatient Diabetes Program Recommendations  AACE/ADA: New Consensus Statement on Inpatient Glycemic Control (2015)  Target Ranges:  Prepandial:   less than 140 mg/dL      Peak postprandial:   less than 180 mg/dL (1-2 hours)      Critically ill patients:  140 - 180 mg/dL   Lab Results  Component Value Date   GLUCAP 385 (H) 11/06/2018   HGBA1C 6.7 (H) 04/28/2018    Review of Glycemic Control Results for Andrew Larsen, Andrew Larsen (MRN [id number]) as of 11/06/2018 14:30  Ref. Range 11/05/2018 11:26 11/05/2018 17:36 11/05/2018 21:35 11/06/2018 08:33 11/06/2018 12:21  Glucose-Capillary Latest Ref Range: 70 - 99 mg/dL 175 (H) 196 (H) 283 (H) 301 (H) 385 (H)   Diabetes history: DM2 Outpatient Diabetes medications:Novolin 70/30 insulin 50 units in am + 30 units pm ac meals Current orders for Inpatient glycemic control:  Lantus 80 units + Novolog sensitive correction + hs 0-5 units  Inpatient Diabetes Program Recommendations:   -Increase Novolog correction to moderate tid + hs -Add Novolog 5 units tid meal coverage if eats 50%  Thank you, Bethena Roys E. [place] Rios, RN, MSN, CDE  Diabetes Coordinator Inpatient Glycemic Control Team Team Pager [phone] (8am-5pm) 11/06/2018 2:34 PM

## 2018-11-06 NOTE — Progress Notes (Signed)
Patient has had intermittent sinus tachycardia since admission, md aware, also respiratory rate was inaccurate due to coughing spell at the time, actual respiratory rate was 22 once coughing subsided

## 2018-11-06 NOTE — Progress Notes (Signed)
Physical Therapy Treatment Patient Details Name: Andrew Larsen MRN: [id number] DOB: 28-Jun-1940 Today's Date: 11/06/2018    History of Present Illness Andrew Larsen is a 78 y.o. male with medical history significant of PAD, DM, HTN, BPH, OSA who presents due to SOB and nonproductive cough. Positive for Covid .  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 Reported multiple falls.    PT Comments    The patient ambulated on RA with sats dropping to85%. Placed on 2 Liters with sats 88%. Patient must ambulate slowly due to LE weakness and fall risk, Patient  Declines  HHPT, will set up OPPT wen recovered from covid.   Follow Up Recommendations  No PT follow up(pt declined, had to cancel OPPT)     Equipment Recommendations  None recommended by PT    Recommendations for Other Services       Precautions / Restrictions Precautions Precautions: Fall Precaution Comments: right LE is much weaker than left and may buckle Restrictions Weight Bearing Restrictions: No    Mobility  Bed Mobility               General bed mobility comments: pt seated EOB upon arrival  Transfers Overall transfer level: Needs assistance Equipment used: Rolling walker (2 wheeled) Transfers: Sit to/from Stand Sit to Stand: Mod assist;+2 safety/equipment         General transfer comment: boosting assist to rise and steady at RW, increased time/effort to perform. pt stood from EOB and toilet during session  Ambulation/Gait Ambulation/Gait assistance: Min assist;+2 safety/equipment Gait Distance (Feet): 20 Feet(then 30) Assistive device: Rolling walker (2 wheeled) Gait Pattern/deviations: Step-through pattern;Step-to pattern;Steppage     General Gait Details: noted steppage on both feet   Stairs             Wheelchair Mobility    Modified Rankin (Stroke Patients Only)        Balance Overall balance assessment: Needs assistance Sitting-balance support: Bilateral upper extremity supported;Feet supported Sitting balance-Leahy Scale: Fair     Standing balance support: Bilateral upper extremity supported;During functional activity Standing balance-Leahy Scale: Poor Standing balance comment: Reliant on UE support                            Cognition Arousal/Alertness: Awake/alert Behavior During Therapy: WFL for tasks assessed/performed;Flat affect Overall Cognitive Status: Within Functional Limits for tasks assessed                                        Exercises      General Comments        Pertinent Vitals/Pain Pain Assessment: No/denies pain Faces Pain Scale: Hurts little more    Home Living                      Prior Function            PT Goals (current goals can now be found in the care plan section) Acute Rehab PT Goals Patient Stated Goal: to feel better, home soon Progress towards PT goals: Progressing toward goals    Frequency    Min 3X/week      PT Plan Current plan remains appropriate    Co-evaluation PT/OT/SLP Co-Evaluation/Treatment: Yes Reason for Co-Treatment: For patient/therapist safety PT goals  addressed during session: Mobility/safety with mobility OT goals addressed during session: ADL's and self-care      AM-PAC PT "6 Clicks" Mobility   Outcome Measure  Help needed turning from your back to your side while in a flat bed without using bedrails?: None Help needed moving from lying on your back to sitting on the side of a flat bed without using bedrails?: A Little Help needed moving to and from a bed to a chair (including a wheelchair)?: A Lot Help needed standing up from a chair using your arms (e.g., wheelchair or bedside chair)?: A Lot Help needed to walk in hospital room?: A Lot Help needed climbing 3-5 steps with a railing? : Total 6 Click Score: 14    End of  Session Equipment Utilized During Treatment: Gait belt;Oxygen Activity Tolerance: Treatment limited secondary to medical complications (Comment) Patient left: in chair;with call bell/phone within reach;with chair alarm set Nurse Communication: Mobility status PT Visit Diagnosis: Unsteadiness on feet (R26.81);Muscle weakness (generalized) (M62.81);Repeated falls (R29.6)     Time: 1610-9604 PT Time Calculation (min) (ACUTE ONLY): 55 min  Charges:  $Gait Training: 23-37 mins                     [place] Pager [phone] Office [phone]    Claretha Cooper 11/06/2018, 3:22 PM

## 2018-11-06 NOTE — Progress Notes (Signed)
Occupational Therapy Treatment Patient Details Name: Andrew Larsen MRN: [id number] DOB: 05/09/1940 Today's Date: 11/06/2018    History of present illness Andrew Larsen is a 78 y.o. male with medical history significant of PAD, DM, HTN, BPH, OSA who presents due to SOB and nonproductive cough. Positive for Covid .  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 Reported multiple falls.   OT comments  Pt progressing towards OT goals; he was able to perform functional mobility within room using RW and overall minA (+2 safety). Pt requiring maxA for toileting ADL this session, setup/supervision for seated grooming ADL. Pt with increased DOE with activity during session most notably when trialled on RA, and with O2 sats decreasing to 85%; pt requiring 2L O2 to maintain sats at 90% and greater with activity. Feel POC remains appropriate at this time. Will continue to follow acutely.   Follow Up Recommendations  Home health OT;Supervision/Assistance - 24 hour    Equipment Recommendations  3 in 1 bedside commode;Tub/shower seat          Precautions / Restrictions Precautions Precautions: Fall Precaution Comments: right LE is much weaker than left and may buckle Restrictions Weight Bearing Restrictions: No       Mobility Bed Mobility               General bed mobility comments: pt seated EOB upon arrival  Transfers Overall transfer level: Needs assistance Equipment used: Rolling walker (2 wheeled) Transfers: Sit to/from Stand Sit to Stand: Mod assist;+2 safety/equipment         General transfer comment: boosting assist to rise and steady at RW, increased time/effort to perform. pt stood from EOB and toilet during session    Balance Overall balance assessment: Needs assistance Sitting-balance support: Bilateral upper extremity supported;Feet supported Sitting balance-Leahy  Scale: Fair     Standing balance support: Bilateral upper extremity supported;During functional activity Standing balance-Leahy Scale: Poor Standing balance comment: Reliant on UE support                           ADL either performed or assessed with clinical judgement   ADL Overall ADL's : Needs assistance/impaired     Grooming: Supervision/safety;Set up;Sitting                   Toilet Transfer: Minimal assistance;+2 for physical assistance;+2 for safety/equipment;RW;Grab bars   Toileting- Clothing Manipulation and Hygiene: Moderate assistance;Maximal assistance;+2 for safety/equipment;Sit to/from stand Toileting - Clothing Manipulation Details (indicate cue type and reason): assist for gown management and peri-care     Functional mobility during ADLs: Minimal assistance;+2 for physical assistance;+2 for safety/equipment;Rolling walker General ADL Comments: pt with increased DOE during room level activity today during trial on RA, required 2L O2 during remainder of session                       Cognition Arousal/Alertness: Awake/alert Behavior During Therapy: WFL for tasks assessed/performed;Flat affect Overall Cognitive Status: Within Functional Limits for tasks assessed                                          Exercises     Shoulder Instructions       General Comments      Pertinent Vitals/  Pain       Pain Assessment: No/denies pain  Home Living                                          Prior Functioning/Environment              Frequency  Min 3X/week        Progress Toward Goals  OT Goals(current goals can now be found in the care plan section)  Progress towards OT goals: Progressing toward goals  Acute Rehab OT Goals Patient Stated Goal: to feel better, home soon OT Goal Formulation: With patient Time For Goal Achievement: 11/18/18 Potential to Achieve Goals: Good ADL Goals Pt  Will Perform Grooming: with min guard assist;standing Pt Will Perform Lower Body Dressing: with min guard assist;with adaptive equipment;sit to/from stand Pt Will Transfer to Toilet: with min guard assist;bedside commode;ambulating Pt Will Perform Toileting - Clothing Manipulation and hygiene: with min guard assist;sit to/from stand;sitting/lateral leans Pt Will Perform Tub/Shower Transfer: with min assist;shower seat;ambulating;Tub transfer  Plan Discharge plan remains appropriate    Co-evaluation    PT/OT/SLP Co-Evaluation/Treatment: Yes Reason for Co-Treatment: For patient/therapist safety;To address functional/ADL transfers   OT goals addressed during session: ADL's and self-care      AM-PAC OT "6 Clicks" Daily Activity     Outcome Measure   Help from another person eating meals?: None Help from another person taking care of personal grooming?: A Little Help from another person toileting, which includes using toliet, bedpan, or urinal?: A Lot Help from another person bathing (including washing, rinsing, drying)?: A Lot Help from another person to put on and taking off regular upper body clothing?: A Little Help from another person to put on and taking off regular lower body clothing?: A Lot 6 Click Score: 16    End of Session Equipment Utilized During Treatment: Gait belt;Rolling walker;Oxygen  OT Visit Diagnosis: Unsteadiness on feet (R26.81);Other abnormalities of gait and mobility (R26.89);Muscle weakness (generalized) (M62.81)   Activity Tolerance Patient tolerated treatment well   Patient Left in chair;with call bell/phone within reach;with chair alarm set   Nurse Communication Mobility status        Time: 9211-9417 OT Time Calculation (min): 38 min  Charges: OT General Charges $OT Visit: 1 Visit OT Treatments $Self Care/Home Management : 8-22 mins  Lou Cal, [place] Pager [phone] Office  [phone]    Andrew Larsen 11/06/2018, 12:55 PM

## 2018-11-06 NOTE — Progress Notes (Signed)
SATURATION QUALIFICATIONS: (This note is used to comply with regulatory documentation for home oxygen)  Patient Saturations on Room Air at Rest = 88%  Patient Saturations on Room Air while Ambulating = 85%  Patient Saturations on 4 Liters of oxygen while Ambulating = 90%  Please briefly explain why patient needs home oxygen:  Patient sats are 88% on room air at rest, and drop to 85% while ambulating without oxygen.

## 2018-11-06 NOTE — Progress Notes (Signed)
PROGRESS NOTE    Andrew Larsen  BLT:[id number] DOB: 03/25/1941 DOA: 11/03/2018 PCP: Velna Hatchet, MD   Brief Narrative:  78 y.o. BM PMHx  T2DM, PAD, HTN, OSA, and BPH who presented with dry cough and shortness of breath with positive covid exposures. He tested positive for covid and had bilateral CXR infiltrates, admitted to [facility] and has developed hypoxia.   Subjective: A/O x4, positive S OB, negative CP, negative abdominal pain.  States daughter COVID positive.   Assessment & Plan:   Principal Problem:   COVID-19 virus infection Active Problems:   Morbid obesity ([place])   Claudication in peripheral vascular disease (HCC)   CKD (chronic kidney disease) stage 3, GFR 30-59 ml/min (HCC)   Type 2 diabetes mellitus (HCC)   Essential hypertension   COVID-19  Acute respiratory failure with hypoxia/COVID 19 pneumonia  - Patient still requiring 2 L O2 via [place] to maintain SPO2 low 90s - Ambulatory SPO2 pending; record findings in epic for insurance purposes - 7/9 decrease Solu-Medrol 80 mg daily  , HTN, DM, obesity.  - Continue airborne, contact precautions. PPE including surgical gown, gloves, cap, shoe covers, and CAPR used during this encounter in a negative pressure room.  - Check daily labs: CBC w/diff, CMP, d-dimer, ferritin, CRP - Maintain euvolemia/net negative.  - Avoid NSAIDs - Recommend proning and aggressive use of incentive spirometry. - Start steroids with plan to continue at least 7-10 days. Will hold remdesivir for now, but if hypoxia continues to progress, would start remdesivir. Will also trial lasix x1 and repeat ambulatory pulse oximetry tomorrow.  Diabetes type 2 controlled with complication -Hemoglobin A1c pending -Lipid panel pending - See pneumonia - Lantus 30 units daily - Sensitive SSI  Diabetic neuropathy - No complaints - Continue gabapentin 600 mg 3 times daily  CKD stage III (baseline Cr 1.5) - Avoid nephrotoxic medication - Strict in and out  -Daily weight Recent Labs  Lab 11/03/18 1429 11/04/18 0215 11/05/18 0330 11/06/18 0330  CREATININE 1.78* 1.63* 1.64* 1.56*  -Back to baseline  CHF? - Negative echocardiogram on file -Appears euvolemic -Continue Lasix 40 mg daily - irbesartan 150 mg daily  CAD - Continue home medication  Essential HTN - See CHF  PAD - Cilostazol 100 mg twice daily   OSA  -HF [place] nightly while admitted OSA:   Obesity: BMI 37.  - Noted   DVT prophylaxis: Subcu heparin Code Status: Full Family Communication: None Disposition Plan: Discharge in a.m.   Consultants:    Procedures/Significant Events:     I have personally reviewed and interpreted all radiology studies and my findings are as above.  VENTILATOR SETTINGS:    Cultures   Antimicrobials: Anti-infectives (From admission, onward)   None       Devices    LINES / TUBES:      Continuous Infusions:   Objective: Vitals:   11/06/18 0400 11/06/18 0500 11/06/18 0600 11/06/18 0749  BP: (!) 175/86   (!) 121/41  Pulse: 84 92 96 99  Resp: (!) 21 20 (!) 22 (!) 23  Temp: 99.2 F (37.3 C)   98.9 F (37.2 C)  TempSrc: Oral   Oral  SpO2: 92% 95% 94% 99%  Weight:      Height:        Intake/Output Summary (Last 24 hours) at 11/06/2018 1314 Last data filed at 11/06/2018 0753 Gross per 24 hour  Intake 360 ml  Output 675 ml  Net -315 ml   [employer]  11/03/18 1320 11/03/18 1710  Weight: 117.9 kg 121.1 kg    Examination:  General: A/O x4, positive acute respiratory distress (2 L O2 via [place]; SPO2= 93%) Eyes: negative scleral hemorrhage, negative anisocoria, negative icterus ENT: Negative Runny nose, negative gingival bleeding, Neck:  Negative scars, masses, torticollis, lymphadenopathy, JVD Lungs: Clear to auscultation bilaterally, positive bilateral expiratory wheezes, negative crackles Cardiovascular: Regular rate and rhythm without murmur gallop or rub normal S1 and S2 Abdomen: negative abdominal  pain, nondistended, positive soft, bowel sounds, no rebound, no ascites, no appreciable mass Extremities: No significant cyanosis, clubbing, or edema bilateral lower extremities Skin: Negative rashes, lesions, ulcers Psychiatric:  Negative depression, negative anxiety, negative fatigue, negative mania  Central nervous system:  Cranial nerves II through XII intact, tongue/uvula midline, all extremities muscle strength 5/5, sensation intact throughout, negative dysarthria, negative expressive aphasia, negative receptive aphasia.  .     Data Reviewed: Care during the described time interval was provided by me .  I have reviewed this patient's available data, including medical history, events of note, physical examination, and all test results as part of my evaluation.   CBC: Recent Labs  Lab 11/03/18 1429 11/04/18 0215 11/05/18 0330 11/06/18 0330  WBC 4.8 4.0 5.3 4.7  NEUTROABS 2.8 2.0 2.9 3.8  HGB 13.8 14.6 14.1 13.8  HCT 42.1 43.2 41.9 41.7  MCV 93.3 92.1 92.3 90.5  PLT 161 157 151 209   Basic Metabolic Panel: Recent Labs  Lab 11/03/18 1429 11/04/18 0215 11/05/18 0330 11/06/18 0330  NA 135 136 136 131*  K 4.2 4.2 3.8 4.2  CL 100 101 99 94*  CO2 27 27 26 23   GLUCOSE 111* 118* 124* 285*  BUN 23 22 24* 28*  CREATININE 1.78* 1.63* 1.64* 1.56*  CALCIUM 8.3* 8.5* 8.3* 8.2*   GFR: Estimated Creatinine Clearance: 53.3 mL/min (A) (by C-G formula based on SCr of 1.56 mg/dL (H)). Liver Function Tests: Recent Labs  Lab 11/03/18 1429 11/04/18 0215 11/05/18 0330 11/06/18 0330  AST 55* 58* 67* 75*  ALT 42 41 42 51*  ALKPHOS 77 76 67 73  BILITOT 0.4 0.3 0.4 0.6  PROT 6.7 6.8 6.4* 6.9  ALBUMIN 3.5 3.4* 3.2* 3.3*   No results for input(s): LIPASE, AMYLASE in the last 168 hours. No results for input(s): AMMONIA in the last 168 hours. Coagulation Profile: No results for input(s): INR, PROTIME in the last 168 hours. Cardiac Enzymes: No results for input(s): CKTOTAL, CKMB,  CKMBINDEX, TROPONINI in the last 168 hours. BNP (last 3 results) No results for input(s): PROBNP in the last 8760 hours. HbA1C: No results for input(s): HGBA1C in the last 72 hours. CBG: Recent Labs  Lab 11/05/18 1126 11/05/18 1736 11/05/18 2135 11/06/18 0833 11/06/18 1221  GLUCAP 175* 196* 283* 301* 385*   Lipid Profile: Recent Labs    11/03/18 1428  TRIG 87   Thyroid Function Tests: No results for input(s): TSH, T4TOTAL, FREET4, T3FREE, THYROIDAB in the last 72 hours. Anemia Panel: Recent Labs    11/03/18 1830  FERRITIN 222   Urine analysis: No results found for: COLORURINE, APPEARANCEUR, LABSPEC, PHURINE, GLUCOSEU, HGBUR, BILIRUBINUR, KETONESUR, PROTEINUR, UROBILINOGEN, NITRITE, LEUKOCYTESUR Sepsis Labs: @LABRCNTIP (procalcitonin:4,lacticidven:4)  ) Recent Results (from the past 240 hour(s))  Culture, blood (routine x 2)     Status: None (Preliminary result)   Collection Time: 11/03/18  2:29 PM   Specimen: BLOOD  Result Value Ref Range Status   Specimen Description   Final    BLOOD RIGHT ANTECUBITAL Performed  at [facility], [place] [address], [place], [place] [address]    Special Requests   Final    BOTTLES DRAWN AEROBIC AND ANAEROBIC Blood Culture adequate volume Performed at [place] [address], [place], [place] [address]    Culture   Final    NO GROWTH 3 DAYS Performed at [facility], [place] [address], [place], [place] [address]    Report Status PENDING  Incomplete  Culture, blood (routine x 2)     Status: None (Preliminary result)   Collection Time: 11/03/18  2:29 PM   Specimen: BLOOD RIGHT HAND  Result Value Ref Range Status   Specimen Description   Final    BLOOD RIGHT HAND Performed at [place] [address] [name], [place] [address]    Special Requests   Final    BOTTLES DRAWN AEROBIC AND ANAEROBIC Blood Culture adequate volume Performed at [place] [address] [place], [place] [address]    Culture   Final    NO GROWTH 3 DAYS Performed at [facility], [place] [address], [place],  [address]    Report Status PENDING  Incomplete  SARS Coronavirus 2 (CEPHEID- Performed in [place] hospital lab), Hosp Order     Status: Abnormal   Collection Time: 11/03/18  2:29 PM   Specimen: Nasopharyngeal Swab  Result Value Ref Range Status   SARS Coronavirus 2 POSITIVE (A) NEGATIVE Final    Comment: RESULT CALLED TO, READ BACK BY AND VERIFIED WITH: ZULETA,C AT 1600 ON 11/03/2018 BY JPM (NOTE) If result is NEGATIVE SARS-CoV-2 target nucleic acids are NOT DETECTED. The SARS-CoV-2 RNA is generally detectable in upper and lower  respiratory specimens during the acute phase of infection. The lowest  concentration of SARS-CoV-2 viral copies this assay can detect is 250  copies / mL. A negative result does not preclude SARS-CoV-2 infection  and should not be used as the sole basis for treatment or other  patient management decisions.  A negative result may occur with  improper specimen collection / handling, submission of specimen other  than nasopharyngeal swab, presence of viral mutation(s) within the  areas targeted by this assay, and inadequate number of viral copies  (<250 copies / mL). A negative result must be combined with clinical  observations, patient history, and epidemiological information. If result is POSITIVE SARS-CoV-2 target nucleic acids are DETECTED. T he SARS-CoV-2 RNA is generally detectable in upper and lower  respiratory specimens during the acute phase of infection.  Positive  results are indicative of active infection with SARS-CoV-2.  Clinical  correlation with patient history and other diagnostic information is  necessary to determine patient infection status.  Positive results do  not rule out bacterial infection or co-infection with other viruses. If result is PRESUMPTIVE POSTIVE SARS-CoV-2  nucleic acids MAY BE PRESENT.   A presumptive positive result was obtained on the submitted specimen  and confirmed on repeat testing.  While 2019 novel coronavirus  (SARS-CoV-2) nucleic acids may be present in the submitted sample  additional confirmatory testing may be necessary for epidemiological  and / or clinical management purposes  to differentiate between  SARS-CoV-2 and other Sarbecovirus currently known to infect humans.  If clinically indicated additional testing with an alternate test  methodology [phone]) is  advised. The SARS-CoV-2 RNA is generally  detectable in upper and lower respiratory specimens during the acute  phase of infection. The expected result  is Negative. Fact Sheet for Patients:  StrictlyIdeas.no Fact Sheet for Healthcare Providers: BankingDealers.co.za This test is not yet approved or cleared by the [place] FDA and has been authorized for detection and/or diagnosis of SARS-CoV-2 by FDA under an Emergency Use Authorization (EUA).  This EUA will remain in effect (meaning this test can be used) for the duration of the COVID-19 declaration under Section 564(b)(1) of the Act, 21 U.S.C. section 360bbb-3(b)(1), unless the authorization is terminated or revoked sooner. Performed at [facility], [place] [address] [place], [place] [address]          Radiology Studies: Dg Chest Port 1 View  Result Date: 11/05/2018 CLINICAL DATA:  Acute respiratory disease secondary to COVID-19. EXAM: PORTABLE CHEST 1 VIEW 5:11 a.m. COMPARISON:  11/03/2018 and 03/25/2016 FINDINGS: Faint bilateral pulmonary infiltrates appear slightly improved at the bases. Heart size and pulmonary vascularity are normal. No effusions. No bone abnormality. IMPRESSION: Slight improvement in faint bilateral pulmonary infiltrates. Electronically Signed   By: Lorriane Shire M.D.   On: 11/05/2018 05:50        Scheduled Meds:  . aspirin  81 mg Oral QHS  . brimonidine  1 drop Both Eyes BID   And  . timolol  1 drop Both Eyes BID  . cilostazol  100 mg Oral BID  . famotidine  20 mg Oral QHS  . furosemide  40 mg Oral Daily  . gabapentin  600 mg Oral TID  . heparin  5,000 Units Subcutaneous Q8H  . insulin aspart  0-5 Units Subcutaneous QHS  . insulin aspart  0-9 Units Subcutaneous TID WC  . insulin glargine  30 Units Subcutaneous QHS  . irbesartan  150 mg Oral Daily  . latanoprost  1 drop Both Eyes QHS  . methylPREDNISolone (SOLU-MEDROL) injection  80 mg Intravenous Q12H  . oxybutynin  5 mg Oral BID  . sodium chloride flush  3 mL Intravenous Q12H  . tamsulosin  0.4 mg Oral Daily   Continuous Infusions:   LOS: 1 day   The patient is critically ill with multiple organ systems failure and requires high complexity decision making for assessment and support, frequent evaluation and titration of therapies, application of advanced monitoring technologies and extensive interpretation of multiple databases. Critical Care Time devoted to patient care services described in this note  Time spent: 40 minutes     WOODS, Geraldo Docker, MD [facility] Pager [phone]  If 7PM-7AM, please contact night-coverage [email or web address] Password [email or web address] 11/06/2018, 1:14 PM

## 2018-11-06 NOTE — Progress Notes (Signed)
SATURATION QUALIFICATIONS: (This note is used to comply with regulatory documentation for home oxygen)  Patient Saturations on Room Air at Rest = 92%  Patient Saturations on Room Air while Ambulating = 85%  Patient Saturations on 2 Liters of oxygen while Ambulating = 90%  Please briefly explain why patient needs home oxygen:to keep oxygen saturation > 88%. [place] [phone] Office [phone]

## 2018-11-06 NOTE — Progress Notes (Signed)
Patient evening CBG 427 MD Sherral Hammers made aware, new order for 30 Units of SQ novolog insulin given. Will administer and continue to monitor.

## 2018-11-07 ENCOUNTER — Inpatient Hospital Stay (HOSPITAL_COMMUNITY): Payer: Medicare HMO

## 2018-11-07 DIAGNOSIS — I739 Peripheral vascular disease, unspecified: Secondary | ICD-10-CM

## 2018-11-07 DIAGNOSIS — G7241 Inclusion body myositis [IBM]: Secondary | ICD-10-CM | POA: Diagnosis present

## 2018-11-07 DIAGNOSIS — E118 Type 2 diabetes mellitus with unspecified complications: Secondary | ICD-10-CM

## 2018-11-07 DIAGNOSIS — E0841 Diabetes mellitus due to underlying condition with diabetic mononeuropathy: Secondary | ICD-10-CM

## 2018-11-07 DIAGNOSIS — N183 Chronic kidney disease, stage 3 (moderate): Secondary | ICD-10-CM

## 2018-11-07 DIAGNOSIS — U071 COVID-19: Principal | ICD-10-CM

## 2018-11-07 DIAGNOSIS — J988 Other specified respiratory disorders: Secondary | ICD-10-CM

## 2018-11-07 LAB — COMPREHENSIVE METABOLIC PANEL
ALT: 69 U/L — ABNORMAL HIGH (ref 0–44)
AST: 83 U/L — ABNORMAL HIGH (ref 15–41)
Albumin: 3.5 g/dL (ref 3.5–5.0)
Alkaline Phosphatase: 71 U/L (ref 38–126)
Anion gap: 15 (ref 5–15)
BUN: 39 mg/dL — ABNORMAL HIGH (ref 8–23)
CO2: 22 mmol/L (ref 22–32)
Calcium: 8.6 mg/dL — ABNORMAL LOW (ref 8.9–10.3)
Chloride: 94 mmol/L — ABNORMAL LOW (ref 98–111)
Creatinine, Ser: 1.65 mg/dL — ABNORMAL HIGH (ref 0.61–1.24)
GFR calc Af Amer: 46 mL/min — ABNORMAL LOW (ref >60)
GFR calc non Af Amer: 40 mL/min — ABNORMAL LOW (ref >60)
Glucose, Bld: 295 mg/dL — ABNORMAL HIGH (ref 70–99)
Potassium: 4.3 mmol/L (ref 3.5–5.1)
Sodium: 131 mmol/L — ABNORMAL LOW (ref 135–145)
Total Bilirubin: 0.3 mg/dL (ref 0.3–1.2)
Total Protein: 7.4 g/dL (ref 6.5–8.1)

## 2018-11-07 LAB — POCT I-STAT 7, (LYTES, BLD GAS, ICA,H+H)
Acid-base deficit: 1 mmol/L (ref 0.0–2.0)
Bicarbonate: 23.3 mmol/L (ref 20.0–28.0)
Bicarbonate: 21.7 mmol/L (ref 20.0–28.0)
Calcium, Ion: 1.12 mmol/L — ABNORMAL LOW (ref 1.15–1.40)
Calcium, Ion: 1.13 mmol/L — ABNORMAL LOW (ref 1.15–1.40)
HCT: 52 % (ref 39.0–52.0)
HCT: 46 % (ref 39.0–52.0)
Hemoglobin: 17.7 g/dL — ABNORMAL HIGH (ref 13.0–17.0)
Hemoglobin: 15.6 g/dL (ref 13.0–17.0)
O2 Saturation: 63 %
O2 Saturation: 85 %
Patient temperature: 98.2
Patient temperature: 98.2
Potassium: 3.9 mmol/L (ref 3.5–5.1)
Potassium: 4 mmol/L (ref 3.5–5.1)
Sodium: 128 mmol/L — ABNORMAL LOW (ref 135–145)
Sodium: 129 mmol/L — ABNORMAL LOW (ref 135–145)
TCO2: 24 mmol/L (ref 22–32)
TCO2: 23 mmol/L (ref 22–32)
pCO2 arterial: 35 mmHg (ref 32.0–48.0)
pCO2 arterial: 30.5 mmHg — ABNORMAL LOW (ref 32.0–48.0)
pH, Arterial: 7.431 (ref 7.350–7.450)
pH, Arterial: 7.459 — ABNORMAL HIGH (ref 7.350–7.450)
pO2, Arterial: 31 mmHg — CL (ref 83.0–108.0)
pO2, Arterial: 46 mmHg — ABNORMAL LOW (ref 83.0–108.0)

## 2018-11-07 LAB — GLUCOSE, CAPILLARY
Glucose-Capillary: 264 mg/dL — ABNORMAL HIGH (ref 70–99)
Glucose-Capillary: 324 mg/dL — ABNORMAL HIGH (ref 70–99)
Glucose-Capillary: 294 mg/dL — ABNORMAL HIGH (ref 70–99)
Glucose-Capillary: 263 mg/dL — ABNORMAL HIGH (ref 70–99)

## 2018-11-07 LAB — CBC WITH DIFFERENTIAL/PLATELET
Abs Immature Granulocytes: 0.07 10*3/uL (ref 0.00–0.07)
Basophils Absolute: 0 10*3/uL (ref 0.0–0.1)
Basophils Relative: 0 %
Eosinophils Absolute: 0 10*3/uL (ref 0.0–0.5)
Eosinophils Relative: 0 %
HCT: 43.9 % (ref 39.0–52.0)
Hemoglobin: 14.9 g/dL (ref 13.0–17.0)
Immature Granulocytes: 1 %
Lymphocytes Relative: 6 %
Lymphs Abs: 0.7 10*3/uL (ref 0.7–4.0)
MCH: 30.9 pg (ref 26.0–34.0)
MCHC: 33.9 g/dL (ref 30.0–36.0)
MCV: 91.1 fL (ref 80.0–100.0)
Monocytes Absolute: 0.9 10*3/uL (ref 0.1–1.0)
Monocytes Relative: 7 %
Neutro Abs: 10.1 10*3/uL — ABNORMAL HIGH (ref 1.7–7.7)
Neutrophils Relative %: 86 %
Platelets: 198 10*3/uL (ref 150–400)
RBC: 4.82 MIL/uL (ref 4.22–5.81)
RDW: 13.4 % (ref 11.5–15.5)
WBC: 11.7 10*3/uL — ABNORMAL HIGH (ref 4.0–10.5)
nRBC: 0 % (ref 0.0–0.2)

## 2018-11-07 LAB — C-REACTIVE PROTEIN: CRP: 12.6 mg/dL — ABNORMAL HIGH (ref <1.0)

## 2018-11-07 LAB — D-DIMER, QUANTITATIVE: D-Dimer, Quant: 0.37 ug/mL-FEU (ref 0.00–0.50)

## 2018-11-07 LAB — MAGNESIUM: Magnesium: 2.1 mg/dL (ref 1.7–2.4)

## 2018-11-07 LAB — LIPID PANEL
Cholesterol: 106 mg/dL (ref 0–200)
HDL: 34 mg/dL — ABNORMAL LOW (ref >40)
LDL Cholesterol: 51 mg/dL (ref 0–99)
Total CHOL/HDL Ratio: 3.1 RATIO
Triglycerides: 105 mg/dL (ref <150)
VLDL: 21 mg/dL (ref 0–40)

## 2018-11-07 LAB — ABO/RH: ABO/RH(D): O POS

## 2018-11-07 MED ORDER — INSULIN ASPART 100 UNIT/ML ~~LOC~~ SOLN
0.0000 [IU] | SUBCUTANEOUS | Status: DC
  Administered 2018-11-07 (×2): 11 [IU] via SUBCUTANEOUS
  Administered 2018-11-08: 3 [IU] via SUBCUTANEOUS
  Administered 2018-11-08: 01:00:00 7 [IU] via SUBCUTANEOUS
  Administered 2018-11-08: 4 [IU] via SUBCUTANEOUS
  Administered 2018-11-08: 7 [IU] via SUBCUTANEOUS
  Administered 2018-11-08: 4 [IU] via SUBCUTANEOUS
  Administered 2018-11-08 – 2018-11-09 (×2): 7 [IU] via SUBCUTANEOUS
  Administered 2018-11-09: 4 [IU] via SUBCUTANEOUS
  Administered 2018-11-09: 11 [IU] via SUBCUTANEOUS
  Administered 2018-11-09: 7 [IU] via SUBCUTANEOUS
  Administered 2018-11-09: 4 [IU] via SUBCUTANEOUS
  Administered 2018-11-09: 20:00:00 11 [IU] via SUBCUTANEOUS
  Administered 2018-11-09: 4 [IU] via SUBCUTANEOUS
  Administered 2018-11-10: 21:00:00 15 [IU] via SUBCUTANEOUS
  Administered 2018-11-10: 16:00:00 11 [IU] via SUBCUTANEOUS
  Administered 2018-11-10: 08:00:00 4 [IU] via SUBCUTANEOUS
  Administered 2018-11-10: 11 [IU] via SUBCUTANEOUS
  Administered 2018-11-10: 4 [IU] via SUBCUTANEOUS
  Administered 2018-11-10: 04:00:00 7 [IU] via SUBCUTANEOUS
  Administered 2018-11-11: 04:00:00 3 [IU] via SUBCUTANEOUS
  Administered 2018-11-11: 7 [IU] via SUBCUTANEOUS
  Administered 2018-11-11: 15 [IU] via SUBCUTANEOUS
  Administered 2018-11-11: 12:00:00 7 [IU] via SUBCUTANEOUS
  Administered 2018-11-12: 15 [IU] via SUBCUTANEOUS
  Administered 2018-11-12: 20 [IU] via SUBCUTANEOUS
  Administered 2018-11-12: 7 [IU] via SUBCUTANEOUS
  Administered 2018-11-12: 3 [IU] via SUBCUTANEOUS
  Administered 2018-11-12: 4 [IU] via SUBCUTANEOUS
  Administered 2018-11-13: 11 [IU] via SUBCUTANEOUS
  Administered 2018-11-13: 4 [IU] via SUBCUTANEOUS
  Administered 2018-11-13: 20 [IU] via SUBCUTANEOUS
  Administered 2018-11-13: 11 [IU] via SUBCUTANEOUS
  Administered 2018-11-13: 3 [IU] via SUBCUTANEOUS
  Administered 2018-11-13: 20 [IU] via SUBCUTANEOUS
  Administered 2018-11-14: 15 [IU] via SUBCUTANEOUS
  Administered 2018-11-14: 09:00:00 3 [IU] via SUBCUTANEOUS

## 2018-11-07 MED ORDER — ORAL CARE MOUTH RINSE
15.0000 mL | Freq: Two times a day (BID) | OROMUCOSAL | Status: DC
  Administered 2018-11-08 – 2018-11-30 (×43): 15 mL via OROMUCOSAL

## 2018-11-07 MED ORDER — FUROSEMIDE 10 MG/ML IJ SOLN
80.0000 mg | Freq: Once | INTRAMUSCULAR | Status: AC
  Administered 2018-11-07: 12:00:00 80 mg via INTRAVENOUS
  Filled 2018-11-07: qty 8

## 2018-11-07 MED ORDER — INSULIN GLARGINE 100 UNIT/ML ~~LOC~~ SOLN
40.0000 [IU] | Freq: Every day | SUBCUTANEOUS | Status: DC
  Administered 2018-11-07 – 2018-11-12 (×6): 40 [IU] via SUBCUTANEOUS
  Filled 2018-11-07 (×6): qty 0.4

## 2018-11-07 MED ORDER — SODIUM CHLORIDE 0.9 % IV SOLN
200.0000 mg | Freq: Once | INTRAVENOUS | Status: AC
  Administered 2018-11-07: 200 mg via INTRAVENOUS
  Filled 2018-11-07: qty 40

## 2018-11-07 MED ORDER — TOCILIZUMAB 400 MG/20ML IV SOLN
800.0000 mg | Freq: Once | INTRAVENOUS | Status: AC
  Administered 2018-11-07: 800 mg via INTRAVENOUS
  Filled 2018-11-07: qty 40

## 2018-11-07 MED ORDER — VITAMIN C 500 MG PO TABS
500.0000 mg | ORAL_TABLET | Freq: Every day | ORAL | Status: DC
  Administered 2018-11-07 – 2018-12-01 (×25): 500 mg via ORAL
  Filled 2018-11-07 (×25): qty 1

## 2018-11-07 MED ORDER — SODIUM CHLORIDE 0.9 % IV SOLN
INTRAVENOUS | Status: DC
  Administered 2018-11-07: 09:00:00 via INTRAVENOUS

## 2018-11-07 MED ORDER — FOLIC ACID 1 MG PO TABS
1.0000 mg | ORAL_TABLET | Freq: Every day | ORAL | Status: DC
  Administered 2018-11-07 – 2018-12-01 (×25): 1 mg via ORAL
  Filled 2018-11-07 (×25): qty 1

## 2018-11-07 MED ORDER — METHYLPREDNISOLONE SODIUM SUCC 40 MG IJ SOLR
40.0000 mg | Freq: Three times a day (TID) | INTRAMUSCULAR | Status: DC
  Administered 2018-11-07 – 2018-11-11 (×11): 40 mg via INTRAVENOUS
  Filled 2018-11-07 (×11): qty 1

## 2018-11-07 MED ORDER — ZINC SULFATE 220 (50 ZN) MG PO CAPS
220.0000 mg | ORAL_CAPSULE | Freq: Every day | ORAL | Status: DC
  Administered 2018-11-07 – 2018-12-01 (×25): 220 mg via ORAL
  Filled 2018-11-07 (×24): qty 1

## 2018-11-07 MED ORDER — VITAMIN B-1 100 MG PO TABS
100.0000 mg | ORAL_TABLET | Freq: Every day | ORAL | Status: DC
  Administered 2018-11-07 – 2018-12-01 (×25): 100 mg via ORAL
  Filled 2018-11-07 (×25): qty 1

## 2018-11-07 MED ORDER — BRIMONIDINE TARTRATE 0.15 % OP SOLN
1.0000 [drp] | Freq: Two times a day (BID) | OPHTHALMIC | Status: DC
  Administered 2018-11-08 – 2018-12-01 (×47): 1 [drp] via OPHTHALMIC
  Filled 2018-11-07: qty 5

## 2018-11-07 MED ORDER — SODIUM CHLORIDE 0.9 % IV SOLN
100.0000 mg | INTRAVENOUS | Status: AC
  Administered 2018-11-08 – 2018-11-11 (×4): 100 mg via INTRAVENOUS
  Filled 2018-11-07 (×4): qty 20

## 2018-11-07 MED ORDER — IPRATROPIUM-ALBUTEROL 20-100 MCG/ACT IN AERS
1.0000 | INHALATION_SPRAY | Freq: Four times a day (QID) | RESPIRATORY_TRACT | Status: DC
  Administered 2018-11-07 – 2018-11-09 (×6): 1 via RESPIRATORY_TRACT
  Filled 2018-11-07 (×2): qty 4

## 2018-11-07 NOTE — Progress Notes (Addendum)
PROGRESS NOTE    Andrew Larsen  HEN:[id number] DOB: 1941-04-08 DOA: 11/03/2018 PCP: Velna Hatchet, MD   Brief Narrative:  78 y.o. BM PMHx  T2DM, PAD, HTN, OSA, and BPH who presented with dry cough and shortness of breath with positive covid exposures. He tested positive for covid and had bilateral CXR infiltrates, admitted to [facility] and has developed hypoxia.   Subjective: 7/10 A/O x4+ increased S OB from 7/9, positive new onset cough nonproductive, negative CP negative abdominal pain.  Patient responds appropriately but is more lethargic than 7/9.   Assessment & Plan:   Principal Problem:   COVID-19 virus infection Active Problems:   Morbid obesity ([place])   Claudication in peripheral vascular disease (HCC)   CKD (chronic kidney disease) stage 3, GFR 30-59 ml/min (HCC)   Type 2 diabetes mellitus (HCC)   Essential hypertension   COVID-19   Inclusion body myositis  Acute respiratory failure with hypoxia/COVID 19 pneumonia  - 7/10 change Solu-Medrol 40 mg 3 times daily -Increased work of breathing PCXR shows bilateral diffuse increased patchy infiltrates most consistent with pulmonary edema vs worsening COVID infection. -DC normal saline -Lasix IV 80 mg x 1 dose - 7/10 patient not requiring 5 to 9 L O2 via [place] to maintain SPO2 low 90s meets requirements for Remdesivir - Patient CRP> 6 with above O2 requirement.  Needs requirement for Actemra - Aggressive pronating 2 to 3 hours every 12 hours - Titrate O2 to maintain SPO2> 93%  Diabetes type 2 controlled with complication -Hemoglobin A1c pending -Lipid panel pending - See pneumonia - 7/10 increase Lantus 40 units daily - 7/10 resistant SSI   Diabetic neuropathy - No complaints - Continue gabapentin 600 mg 3 times daily  CKD stage III (baseline Cr 1.5) - Avoid nephrotoxic medication - Strict in and out -92ml -Daily weight Recent Labs  Lab 11/03/18 1429 11/04/18 0215 11/05/18 0330 11/06/18 0330 11/07/18 0500   CREATININE 1.78* 1.63* 1.64* 1.56* 1.65*  -Back to baseline  CHF? - Negative echocardiogram on file -Appears euvolemic -Continue Lasix 40 mg daily -7/10 Lasix 80 mg x 1 - irbesartan 150 mg daily  CAD - Continue home medication  Essential HTN - See CHF  PAD - Cilostazol 100 mg twice daily   OSA  -HF [place] nightly while admitted OSA:   Obesity: BMI 37.  - Noted   DVT prophylaxis: Subcu heparin Code Status: Full Family Communication: Spoke at length with patient's daughter and updated her on father's deteriorating status.  Daughter informed me of a diagnosis that patient recently received from [facility] Dr. Warnell Forester neurologist [phone] inclusion body myositis   Disposition Plan: TBD.   Consultants:    Procedures/Significant Events:  7/10 PCXR; increased bilateral patchy opacifications consistent with pulmonary edema, ARDS consistent with worsening COVID infection.    I have personally reviewed and interpreted all radiology studies and my findings are as above.  VENTILATOR SETTINGS:    Cultures 7/6 blood RIGHT antecubital NGTD 7/6 blood RIGHT hand NGTD 7/6 SARS coronavirus positive     Antimicrobials: Anti-infectives (From admission, onward)   Start     Dose/Rate Route Frequency Ordered Stop   11/08/18 1330  remdesivir 100 mg in sodium chloride 0.9 % 250 mL IVPB     100 mg 500 mL/hr over 30 Minutes Intravenous Every 24 hours 11/07/18 1229 11/12/18 1329   11/07/18 1330  remdesivir 200 mg in sodium chloride 0.9 % 250 mL IVPB     200 mg 500 mL/hr over 30  Minutes Intravenous Once 11/07/18 1229 11/07/18 1457       Devices    LINES / TUBES:      Continuous Infusions: . [START ON 11/08/2018] remdesivir 100 mg in NS 250 mL       Objective: Vitals:   11/07/18 0816 11/07/18 1150 11/07/18 1600 11/07/18 1613  BP: 123/80 122/83  119/67  Pulse: (!) 110 (!) 118  (!) 110  Resp: (!) 24 (!) 30  (!) 27  Temp: 98.4 F (36.9 C) 98.2 F (36.8 C)   (!) 97.4 F (36.3 C)  TempSrc: Oral Oral  Oral  SpO2: (!) 85% (!) 87% (!) 84% 90%  Weight:      Height:        Intake/Output Summary (Last 24 hours) at 11/07/2018 1626 Last data filed at 11/07/2018 1508 Gross per 24 hour  Intake 272.14 ml  Output 775 ml  Net -502.86 ml   Filed Weights   11/03/18 1710 11/07/18 0413 11/07/18 0500  Weight: 121.1 kg 119.4 kg 119.4 kg   Physical Exam:  General: A/O x4, positive increased acute respiratory distress (patient now on 9 L O2 SPO2 90%); after moving from chair to bed. Eyes: negative scleral hemorrhage, negative anisocoria, negative icterus ENT: Negative Runny nose, negative gingival bleeding, Neck:  Negative scars, masses, torticollis, lymphadenopathy, JVD Lungs: Tachypneic, decreased breath sounds diffusely, positive cough with deep inspiration.  Negative wheezes or crackles Cardiovascular: Tachycardic, without murmur gallop or rub normal S1 and S2 Abdomen: Obese, negative abdominal pain, nondistended, positive soft, bowel sounds, no rebound, no ascites, no appreciable mass Extremities: No significant cyanosis, clubbing, or edema bilateral lower extremities Skin: Negative rashes, lesions, ulcers Psychiatric:  Negative depression, negative anxiety, negative fatigue, negative mania  Central nervous system: Increased lethargy from 7/9, cranial nerves II through XII intact, tongue/uvula midline, all extremities muscle strength 5/5, sensation intact throughout,  negative dysarthria, negative expressive aphasia, negative receptive aphasia. .     Data Reviewed: Care during the described time interval was provided by me .  I have reviewed this patient's available data, including medical history, events of note, physical examination, and all test results as part of my evaluation.   CBC: Recent Labs  Lab 11/03/18 1429 11/04/18 0215 11/05/18 0330 11/06/18 0330 11/07/18 0500 11/07/18 1254 11/07/18 1312  WBC 4.8 4.0 5.3 4.7 11.7*  --   --    NEUTROABS 2.8 2.0 2.9 3.8 10.1*  --   --   HGB 13.8 14.6 14.1 13.8 14.9 17.7* 15.6  HCT 42.1 43.2 41.9 41.7 43.9 52.0 46.0  MCV 93.3 92.1 92.3 90.5 91.1  --   --   PLT 161 157 151 177 198  --   --    Basic Metabolic Panel: Recent Labs  Lab 11/03/18 1429 11/04/18 0215 11/05/18 0330 11/06/18 0330 11/07/18 0500 11/07/18 1254 11/07/18 1312 11/07/18 1335  NA 135 136 136 131* 131* 128* 129*  --   K 4.2 4.2 3.8 4.2 4.3 3.9 4.0  --   CL 100 101 99 94* 94*  --   --   --   CO2 27 27 26 23 22   --   --   --   GLUCOSE 111* 118* 124* 285* 295*  --   --   --   BUN 23 22 24* 28* 39*  --   --   --   CREATININE 1.78* 1.63* 1.64* 1.56* 1.65*  --   --   --   CALCIUM 8.3* 8.5* 8.3* 8.2* 8.6*  --   --   --  MG  --   --   --   --   --   --   --  2.1   GFR: Estimated Creatinine Clearance: 50 mL/min (A) (by C-G formula based on SCr of 1.65 mg/dL (H)). Liver Function Tests: Recent Labs  Lab 11/03/18 1429 11/04/18 0215 11/05/18 0330 11/06/18 0330 11/07/18 0500  AST 55* 58* 67* 75* 83*  ALT 42 41 42 51* 69*  ALKPHOS 77 76 67 73 71  BILITOT 0.4 0.3 0.4 0.6 0.3  PROT 6.7 6.8 6.4* 6.9 7.4  ALBUMIN 3.5 3.4* 3.2* 3.3* 3.5   No results for input(s): LIPASE, AMYLASE in the last 168 hours. No results for input(s): AMMONIA in the last 168 hours. Coagulation Profile: No results for input(s): INR, PROTIME in the last 168 hours. Cardiac Enzymes: No results for input(s): CKTOTAL, CKMB, CKMBINDEX, TROPONINI in the last 168 hours. BNP (last 3 results) No results for input(s): PROBNP in the last 8760 hours. HbA1C: No results for input(s): HGBA1C in the last 72 hours. CBG: Recent Labs  Lab 11/06/18 1221 11/06/18 1744 11/06/18 2120 11/07/18 0812 11/07/18 1149  GLUCAP 385* 427* 396* 264* 324*   Lipid Profile: Recent Labs    11/07/18 0500  CHOL 106  HDL 34*  LDLCALC 51  TRIG 105  CHOLHDL 3.1   Thyroid Function Tests: No results for input(s): TSH, T4TOTAL, FREET4, T3FREE, THYROIDAB in the  last 72 hours. Anemia Panel: No results for input(s): VITAMINB12, FOLATE, FERRITIN, TIBC, IRON, RETICCTPCT in the last 72 hours. Urine analysis: No results found for: COLORURINE, APPEARANCEUR, LABSPEC, PHURINE, GLUCOSEU, HGBUR, BILIRUBINUR, KETONESUR, PROTEINUR, UROBILINOGEN, NITRITE, LEUKOCYTESUR Sepsis Labs: @LABRCNTIP (procalcitonin:4,lacticidven:4)  ) Recent Results (from the past 240 hour(s))  Culture, blood (routine x 2)     Status: None (Preliminary result)   Collection Time: 11/03/18  2:29 PM   Specimen: BLOOD  Result Value Ref Range Status   Specimen Description   Final    BLOOD RIGHT ANTECUBITAL Performed at [facility] [address] [place], [place] [address]    Special Requests   Final    BOTTLES DRAWN AEROBIC AND ANAEROBIC Blood Culture adequate volume Performed at [facility] [address], [place], [place] [address]    Culture   Final    NO GROWTH 4 DAYS Performed at [facility], [place] [address], [place], [place] [address]    Report Status PENDING  Incomplete  Culture, blood (routine x 2)     Status: None (Preliminary result)   Collection Time: 11/03/18  2:29 PM   Specimen: BLOOD RIGHT HAND  Result Value Ref Range Status   Specimen Description   Final    BLOOD RIGHT HAND Performed at [facility] [address] [place], [place] [address]    Special Requests   Final    BOTTLES DRAWN AEROBIC AND ANAEROBIC Blood Culture adequate volume Performed at [facility] [address] [place], [place] [address]    Culture   Final    NO GROWTH 4 DAYS Performed at [facility], [place] [address], [place], [place] [address]    Report Status PENDING  Incomplete  SARS Coronavirus 2 (CEPHEID- Performed in [facility] hospital lab), Hosp Order     Status: Abnormal   Collection Time: 11/03/18  2:29 PM   Specimen: Nasopharyngeal Swab  Result Value Ref Range Status   SARS  Coronavirus 2 POSITIVE (A) NEGATIVE Final    Comment: RESULT CALLED  TO, READ BACK BY AND VERIFIED WITH: ZULETA,C AT 1600 ON 11/03/2018 BY JPM (NOTE) If result is NEGATIVE SARS-CoV-2 target nucleic acids are NOT DETECTED. The SARS-CoV-2 RNA is generally detectable in upper and lower  respiratory specimens during the acute phase of infection. The lowest  concentration of SARS-CoV-2 viral copies this assay can detect is 250  copies / mL. A negative result does not preclude SARS-CoV-2 infection  and should not be used as the sole basis for treatment or other  patient management decisions.  A negative result may occur with  improper specimen collection / handling, submission of specimen other  than nasopharyngeal swab, presence of viral mutation(s) within the  areas targeted by this assay, and inadequate number of viral copies  (<250 copies / mL). A negative result must be combined with clinical  observations, patient history, and epidemiological information. If result is POSITIVE SARS-CoV-2 target nucleic acids are DETECTED. T he SARS-CoV-2 RNA is generally detectable in upper and lower  respiratory specimens during the acute phase of infection.  Positive  results are indicative of active infection with SARS-CoV-2.  Clinical  correlation with patient history and other diagnostic information is  necessary to determine patient infection status.  Positive results do  not rule out bacterial infection or co-infection with other viruses. If result is PRESUMPTIVE POSTIVE SARS-CoV-2 nucleic acids MAY BE PRESENT.   A presumptive positive result was obtained on the submitted specimen  and confirmed on repeat testing.  While 2019 novel coronavirus  (SARS-CoV-2) nucleic acids may be present in the submitted sample  additional confirmatory testing may be necessary for epidemiological  and / or clinical management purposes  to differentiate between  SARS-CoV-2 and other Sarbecovirus currently known to  infect humans.  If clinically indicated additional testing with an alternate test  methodology [phone]) is  advised. The SARS-CoV-2 RNA is generally  detectable in upper and lower respiratory specimens during the acute  phase of infection. The expected result is Negative. Fact Sheet for Patients:  StrictlyIdeas.no Fact Sheet for Healthcare Providers: BankingDealers.co.za This test is not yet approved or cleared by the [place] FDA and has been authorized for detection and/or diagnosis of SARS-CoV-2 by FDA under an Emergency Use Authorization (EUA).  This EUA will remain in effect (meaning this test can be used) for the duration of the COVID-19 declaration under Section 564(b)(1) of the Act, 21 U.S.C. section 360bbb-3(b)(1), unless the authorization is terminated or revoked sooner. Performed at [facility], [place] [address] [place],  [address]          Radiology Studies: Dg Chest Port 1 View  Result Date: 11/07/2018 CLINICAL DATA:  Shortness of breath EXAM: PORTABLE CHEST 1 VIEW COMPARISON:  November 05, 2018 FINDINGS: The heart size and mediastinal contours are stable. The heart size is enlarged. Hazy opacities are identified in the right lung and to a lesser degree in the left lung, worse compared to prior exam. There is no pleural effusion. The visualized skeletal structures are unremarkable. IMPRESSION: Bilateral pulmonary infiltrates are slightly worse compared to prior exam of November 05, 2018. Electronically Signed   By: Abelardo Diesel M.D.   On: 11/07/2018 12:10        Scheduled Meds: . aspirin  81 mg Oral QHS  . brimonidine  1 drop Both Eyes BID   And  . timolol  1 drop Both Eyes BID  . cilostazol  100 mg Oral BID  . famotidine  20 mg Oral QHS  .  folic acid  1 mg Oral Daily  . furosemide  40 mg Oral Daily  . gabapentin  600 mg Oral TID  . heparin  5,000 Units Subcutaneous Q8H  . insulin aspart   0-20 Units Subcutaneous Q4H  . insulin aspart  0-5 Units Subcutaneous QHS  . insulin glargine  40 Units Subcutaneous QHS  . Ipratropium-Albuterol  1 puff Inhalation Q6H  . irbesartan  150 mg Oral Daily  . latanoprost  1 drop Both Eyes QHS  . methylPREDNISolone (SOLU-MEDROL) injection  40 mg Intravenous TID  . oxybutynin  5 mg Oral BID  . sodium chloride flush  3 mL Intravenous Q12H  . tamsulosin  0.4 mg Oral Daily  . thiamine  100 mg Oral Daily  . vitamin C  500 mg Oral Daily  . zinc sulfate  220 mg Oral Daily   Continuous Infusions: . [START ON 11/08/2018] remdesivir 100 mg in NS 250 mL       LOS: 2 days   The patient is critically ill with multiple organ systems failure and requires high complexity decision making for assessment and support, frequent evaluation and titration of therapies, application of advanced monitoring technologies and extensive interpretation of multiple databases. Critical Care Time devoted to patient care services described in this note  Time spent: 40 minutes     Wyona Neils, Geraldo Docker, MD [facility] Pager [phone]  If 7PM-7AM, please contact night-coverage [email or web address] Password [facility] 11/07/2018, 4:26 PM

## 2018-11-07 NOTE — Progress Notes (Signed)
Patient self prone at this time

## 2018-11-07 NOTE — Plan of Care (Signed)
Daughter called and informed of need to transfer to ICU, since O2 Sats have continued to struggle (currently on HFNC 15L.)  Also informed transferring to room 9203 with Helene Kelp as RN (till 7pm).

## 2018-11-07 NOTE — Procedures (Signed)
Central Venous Catheter Insertion Procedure Note Andrew Larsen [id number] 08-Dec-1940  Procedure: Insertion of Central Venous Catheter Indications: Assessment of intravascular volume  Procedure Details Consent: Risks of procedure as well as the alternatives and risks of each were explained to the (patient/caregiver).  Consent for procedure obtained. Time Out: Verified patient identification, verified procedure, site/side was marked, verified correct patient position, special equipment/implants available, medications/allergies/relevent history reviewed, required imaging and test results available.  Performed  Maximum sterile technique was used including antiseptics, cap, gloves, gown, hand hygiene, mask and sheet. Skin prep: Chlorhexidine; local anesthetic administered A antimicrobial bonded/coated triple lumen catheter was placed in the left internal jugular vein using the Seldinger technique.  Ultrasound was used to verify the patency of the vein and for real time needle guidance.  Evaluation Blood flow good Complications: No apparent complications Patient did tolerate procedure well. Chest X-ray ordered to verify placement.  CXR: pending.  Andrew Larsen 11/07/2018, 6:59 PM

## 2018-11-07 NOTE — Discharge Planning (Signed)
Spoke with daughter and informed of digression of patient and therefore current change of plans.  Daughter explained the administration of remdesevir and therefore high probability of being in the hospital till 7/14.

## 2018-11-07 NOTE — Consult Note (Signed)
NAME:  Andrew Larsen, MRN:  [id number], DOB:  02-23-1941, LOS: 2 ADMISSION DATE:  11/03/2018, CONSULTATION DATE:  7/10 REFERRING MD:  Sherral Hammers, CHIEF COMPLAINT:  Dyspnea   Brief History   78 y/o male with multiple medical problems admitted on 7/6 with COVID 19 pneumonia.  ON 7/10 he developed worsening severe acute respiratory failure requiring transfer to the ICU for heated high flow oxygen.  History of present illness   78 y/o male with multiple medical problems admitted for COVID 19 pneumonai.  He says that he caught it from his grandson and daughter and developed dyspnea, cough and aches in his bones with fever.  These symptoms progressed and he developed worsening dyspnea.  He came to the ER where he was found to have pneumonia and hypoxemia.  He was admitted here for further treatment: steroids, actemra, remdesivir.  His hypoxemia worsened today and he was transferred to the ICU.   Past Medical History  OSA PAD Hypertension Hyperlipidemia GERD DM2 CKD  Significant Hospital Events   7/6 admission 7/10 transfer to ICU  Consults:  PCCM  Procedures:  R IJ CVL 7/10 >>>  Significant Diagnostic Tests:    Micro Data:  7/6 SARS COV2 > positive 7/6 blood culture >   Antimicrobials:  7/6 remdesivir >  7/10 Tocilizumab>   Interim history/subjective:    Objective   Blood pressure 119/67, pulse (!) 110, temperature (!) 97.4 F (36.3 C), temperature source Oral, resp. rate (!) 27, height 5\' 11"  (1.803 m), weight 119.4 kg, SpO2 90 %.        Intake/Output Summary (Last 24 hours) at 11/07/2018 1655 Last data filed at 11/07/2018 1508 Gross per 24 hour  Intake 272.14 ml  Output 775 ml  Net -502.86 ml   Filed Weights   11/03/18 1710 11/07/18 0413 11/07/18 0500  Weight: 121.1 kg 119.4 kg 119.4 kg   Requiring 40LPM heated high flow and 100% FiO2  Examination:  General: Initially respiratory distress with accessory muscle use, improved with heated high flow, now resting  comfortably in bed HENT: NCAT OP clear PULM: Crackles bases B, normal effort CV: RRR, no mgr GI: BS+, soft, nontender MSK: normal bulk and tone Neuro: awake, alert, no distress, MAEW  7/10 CXR images personally reviewed showing severe bilateral airspace disease  Resolved Hospital Problem list     Assessment & Plan:  ARDS due to COVID 19 pneumonia: Continue remdesivir Agree with Actemra Continue solumedrol Continue to administer oxygen via heated high flow Tolerate periods of hypoxemia, goal at rest is greater than 85% SaO2, with movement ideally above 75% Decision for intubation should be based on a change in mental status or physical evidence of ventilatory failure such as nasal flaring, accessory muscle use, paradoxical breathing Out of bed to chair as able Prone positioning while in bed Monitor closely in ICU as he is high risk for intubation  Will likely need CVL as we have been unable to get an IV  Best practice:   Per [facility]  Labs   CBC: Recent Labs  Lab 11/03/18 1429 11/04/18 0215 11/05/18 0330 11/06/18 0330 11/07/18 0500 11/07/18 1254 11/07/18 1312  WBC 4.8 4.0 5.3 4.7 11.7*  --   --   NEUTROABS 2.8 2.0 2.9 3.8 10.1*  --   --   HGB 13.8 14.6 14.1 13.8 14.9 17.7* 15.6  HCT 42.1 43.2 41.9 41.7 43.9 52.0 46.0  MCV 93.3 92.1 92.3 90.5 91.1  --   --   PLT 161 157  151 177 198  --   --     Basic Metabolic Panel: Recent Labs  Lab 11/03/18 1429 11/04/18 0215 11/05/18 0330 11/06/18 0330 11/07/18 0500 11/07/18 1254 11/07/18 1312 11/07/18 1335  NA 135 136 136 131* 131* 128* 129*  --   K 4.2 4.2 3.8 4.2 4.3 3.9 4.0  --   CL 100 101 99 94* 94*  --   --   --   CO2 27 27 26 23 22   --   --   --   GLUCOSE 111* 118* 124* 285* 295*  --   --   --   BUN 23 22 24* 28* 39*  --   --   --   CREATININE 1.78* 1.63* 1.64* 1.56* 1.65*  --   --   --   CALCIUM 8.3* 8.5* 8.3* 8.2* 8.6*  --   --   --   MG  --   --   --   --   --   --   --  2.1   GFR: Estimated Creatinine  Clearance: 50 mL/min (A) (by C-G formula based on SCr of 1.65 mg/dL (H)). Recent Labs  Lab 11/03/18 1428  11/03/18 1429 11/04/18 0215 11/05/18 0330 11/06/18 0330 11/07/18 0500  PROCALCITON <0.10  --   --   --   --   --   --   WBC  --    < > 4.8 4.0 5.3 4.7 11.7*  LATICACIDVEN  --   --  0.7  --   --   --   --    < > = values in this interval not displayed.    Liver Function Tests: Recent Labs  Lab 11/03/18 1429 11/04/18 0215 11/05/18 0330 11/06/18 0330 11/07/18 0500  AST 55* 58* 67* 75* 83*  ALT 42 41 42 51* 69*  ALKPHOS 77 76 67 73 71  BILITOT 0.4 0.3 0.4 0.6 0.3  PROT 6.7 6.8 6.4* 6.9 7.4  ALBUMIN 3.5 3.4* 3.2* 3.3* 3.5   No results for input(s): LIPASE, AMYLASE in the last 168 hours. No results for input(s): AMMONIA in the last 168 hours.  ABG    Component Value Date/Time   PHART 7.459 (H) 11/07/2018 1312   PCO2ART 30.5 (L) 11/07/2018 1312   PO2ART 46.0 (L) 11/07/2018 1312   HCO3 21.7 11/07/2018 1312   TCO2 23 11/07/2018 1312   ACIDBASEDEF 1.0 11/07/2018 1312   O2SAT 85.0 11/07/2018 1312     Coagulation Profile: No results for input(s): INR, PROTIME in the last 168 hours.  Cardiac Enzymes: No results for input(s): CKTOTAL, CKMB, CKMBINDEX, TROPONINI in the last 168 hours.  HbA1C: Hgb A1c MFr Bld  Date/Time Value Ref Range Status  04/28/2018 03:00 PM 6.7 (H) 4.8 - 5.6 % Final    Comment:    (NOTE) Pre diabetes:          5.7%-6.4% Diabetes:              >6.4% Glycemic control for   <7.0% adults with diabetes     CBG: Recent Labs  Lab 11/06/18 1744 11/06/18 2120 11/07/18 0812 11/07/18 1149 11/07/18 1617  GLUCAP 427* 396* 264* 324* 294*    Review of Systems:   Gen: Per HPI HEENT: Denies blurred vision, double vision, hearing loss, tinnitus, sinus congestion, rhinorrhea, sore throat, neck stiffness, dysphagia PULM: Dper HPI CV: Denies chest pain, edema, orthopnea, paroxysmal nocturnal dyspnea, palpitations GI: Denies abdominal pain, nausea,  vomiting, diarrhea, hematochezia, melena, constipation, change  in bowel habits GU: Denies dysuria, hematuria, polyuria, oliguria, urethral discharge Endocrine: Denies hot or cold intolerance, polyuria, polyphagia or appetite change Derm: Denies rash, dry skin, scaling or peeling skin change Heme: Denies easy bruising, bleeding, bleeding gums Neuro: Denies headache, numbness, weakness, slurred speech, loss of memory or consciousness   Past Medical History  He,  has a past medical history of CKD (chronic kidney disease), Daytime somnolence, Diabetes mellitus without complication ([place]), Fracture, GERD (gastroesophageal reflux disease), Glaucoma, Hypercholesteremia, Hyperlipidemia, Hypertension, Mold exposure, PAD (peripheral artery disease) ([place]), and Sleep apnea.   Surgical History    Past Surgical History:  Procedure Laterality Date  . LOWER EXTREMITY ANGIOGRAPHY N/A 06/12/2016   Procedure: Lower Extremity Angiography;  Surgeon: Adrian Prows, MD;  Location: [place] [facility];  Service: Cardiovascular;  Laterality: N/A;  . MUSCLE BIOPSY Right 05/06/2018   Procedure: right vastus lateralis muscle biopsy;  Surgeon: Earnie Larsson, MD;  Location: [place];  Service: Neurosurgery;  Laterality: Right;  . ORIF ANKLE FRACTURE Right 10/02/2012   Procedure: OPEN REDUCTION INTERNAL FIXATION (ORIF) ANKLE FRACTURE;  Surgeon: Meredith Pel, MD;  Location: [facility];  Service: Orthopedics;  Laterality: Right;  . PERIPHERAL VASCULAR ATHERECTOMY Left 06/12/2016   Procedure: Peripheral Vascular Atherectomy-Left Popliteal;  Surgeon: Adrian Prows, MD;  Location: [place] [facility];  Service: Cardiovascular;  Laterality: Left;  . PERIPHERAL VASCULAR INTERVENTION Left 06/12/2016   Procedure: Peripheral Vascular Intervention- DCB Left Popliteal;  Surgeon: Adrian Prows, MD;  Location: [place] [facility];  Service: Cardiovascular;  Laterality: Left;     Social History   reports that he quit smoking about 37 years ago. He has a 20.00  pack-year smoking history. He has never used smokeless tobacco. He reports that he does not drink alcohol or use drugs.   Family History   His family history includes Cancer in his brother; Diabetes in his brother; Heart disease in his father; Prostate cancer in his brother. There is no history of Colon cancer.   Allergies Allergies  Allergen Reactions  . Metformin And Related   . Simbrinza [Brinzolamide-Brimonidine]     Drainage, redness to eyes     Home Medications  Prior to Admission medications   Medication Sig Start Date End Date Taking? Authorizing Provider  aspirin 81 MG chewable tablet Chew 81 mg by mouth at bedtime.    Yes [provider]  brimonidine-timolol (COMBIGAN) 0.2-0.5 % ophthalmic solution Place 1 drop into both eyes every 12 (twelve) hours.   Yes [provider]  calcium carbonate (CALCIUM 600) 600 MG TABS tablet Take 1,200 mg by mouth daily.    Yes [provider]  Cholecalciferol (VITAMIN D3) 5000 units TABS Take 10,000 Units by mouth daily.    Yes [provider]  cilostazol (PLETAL) 100 MG tablet Take 100 mg by mouth 2 (two) times daily.   Yes [provider]  Evolocumab (REPATHA) 140 MG/ML SOSY Inject 140 mg into the skin every 14 (fourteen) days.   Yes [provider]  famotidine (PEPCID) 20 MG tablet Take 20 mg by mouth at bedtime.   Yes [provider]  furosemide (LASIX) 40 MG tablet Take 40 mg by mouth daily.   Yes [provider]  gabapentin (NEURONTIN) 300 MG capsule Take 600 mg by mouth 3 (three) times daily.    Yes [provider]  insulin NPH-regular Human (NOVOLIN 70/30) (70-30) 100 UNIT/ML injection Inject 30-50 Units into the skin See admin instructions. Inject 50 units subcutaneously in  am and 30 units in the evening   Yes [provider]  latanoprost (XALATAN) 0.005 % ophthalmic solution Place 1 drop into both eyes at bedtime.   Yes [provider]   magnesium gluconate (MAGONATE) 500 MG tablet Take 500 mg by mouth daily.   Yes [provider]  Multiple Vitamin (MULTIVITAMIN) capsule Take 1 capsule by mouth daily.    Yes [provider]  olmesartan (BENICAR) 20 MG tablet Take 20 mg by mouth daily.   Yes [provider]  oxybutynin (DITROPAN) 5 MG tablet Take 5 mg by mouth 2 (two) times daily.   Yes [provider]  potassium chloride SA (K-DUR,KLOR-CON) 20 MEQ tablet Take 20 mEq by mouth daily.   Yes [provider]  tamsulosin (FLOMAX) 0.4 MG CAPS capsule Take 0.4 mg by mouth daily.   Yes [provider]  valsartan (DIOVAN) 160 MG tablet Take 160 mg by mouth daily. 09/12/18  Yes [provider]     Critical care time: 35 mintues    Roselie Awkward, MD [facility] [facility] Pager: [phone] Cell: [phone] If no response, call [phone]

## 2018-11-07 NOTE — TOC Transition Note (Signed)
Transition of Care [facility]) - CM/SW Discharge Note   Patient Details  Name: Andrew Larsen MRN: [id number] Date of Birth: Oct 28, 1940  [facility]) CM/SW Contact:  Ninfa Meeker, RN Phone Number: [phone] (working remotely) 11/07/2018, 1:38 PM   Clinical Narrative:  78 yr old gentleman from home with [place] 19. Patient's daughter and son-in-law are dealing with virus as well. Case manager spoke with Andrew Larsen,  patient's daughter concerning discharge and need for oxygen, Patient lives with her and her husband. Referral was called to [place]. Patient will be transported home by his daughter.    Final next level of care: Home/Self Care Barriers to Discharge: No Barriers Identified   Patient Goals and CMS Choice     Choice offered to / list presented to : Adult Children(daughter- Andrew Larsen)  Discharge Placement                       Discharge Plan and Services   Discharge Planning Services: CM Consult Post Acute Care Choice: Durable Medical Equipment          DME Arranged: Oxygen DME Agency: [place] Date DME Agency Contacted: 11/07/18 Time DME Agency Contacted: 1100 Representative spoke with at DME Agency: Learta [facility] Arranged: NA          Social Determinants of Health ([place]) Interventions     Readmission Risk Interventions No flowsheet data found.

## 2018-11-07 NOTE — Progress Notes (Signed)
Pharmacy Brief Note  O:  ALT: 69 CXR: worsening bilateral pulmonary infiltrates SpO2: 87 % on 10 L [place]  A/P:  Patient meets criteria for remdesevir therapy. Will initiate remdesivir 200 mg iv once followed by 100 mg iv daily x 4 days. Will f/u ALT.   Napoleon Form, [facility] 11/07/18 1:49 PM

## 2018-11-08 DIAGNOSIS — J069 Acute upper respiratory infection, unspecified: Secondary | ICD-10-CM

## 2018-11-08 LAB — COMPREHENSIVE METABOLIC PANEL
ALT: 77 U/L — ABNORMAL HIGH (ref 0–44)
AST: 78 U/L — ABNORMAL HIGH (ref 15–41)
Albumin: 3 g/dL — ABNORMAL LOW (ref 3.5–5.0)
Alkaline Phosphatase: 68 U/L (ref 38–126)
Anion gap: 11 (ref 5–15)
BUN: 47 mg/dL — ABNORMAL HIGH (ref 8–23)
CO2: 25 mmol/L (ref 22–32)
Calcium: 8.5 mg/dL — ABNORMAL LOW (ref 8.9–10.3)
Chloride: 98 mmol/L (ref 98–111)
Creatinine, Ser: 1.62 mg/dL — ABNORMAL HIGH (ref 0.61–1.24)
GFR calc Af Amer: 47 mL/min — ABNORMAL LOW (ref >60)
GFR calc non Af Amer: 41 mL/min — ABNORMAL LOW (ref >60)
Glucose, Bld: 164 mg/dL — ABNORMAL HIGH (ref 70–99)
Potassium: 4.1 mmol/L (ref 3.5–5.1)
Sodium: 134 mmol/L — ABNORMAL LOW (ref 135–145)
Total Bilirubin: 0.5 mg/dL (ref 0.3–1.2)
Total Protein: 6.9 g/dL (ref 6.5–8.1)

## 2018-11-08 LAB — LIPID PANEL
Cholesterol: 95 mg/dL (ref 0–200)
HDL: 35 mg/dL — ABNORMAL LOW (ref >40)
LDL Cholesterol: 39 mg/dL (ref 0–99)
Total CHOL/HDL Ratio: 2.7 RATIO
Triglycerides: 104 mg/dL (ref <150)
VLDL: 21 mg/dL (ref 0–40)

## 2018-11-08 LAB — CULTURE, BLOOD (ROUTINE X 2)
Culture: NO GROWTH
Culture: NO GROWTH
Special Requests: ADEQUATE
Special Requests: ADEQUATE

## 2018-11-08 LAB — GLUCOSE, CAPILLARY
Glucose-Capillary: 150 mg/dL — ABNORMAL HIGH (ref 70–99)
Glucose-Capillary: 163 mg/dL — ABNORMAL HIGH (ref 70–99)
Glucose-Capillary: 171 mg/dL — ABNORMAL HIGH (ref 70–99)
Glucose-Capillary: 202 mg/dL — ABNORMAL HIGH (ref 70–99)
Glucose-Capillary: 204 mg/dL — ABNORMAL HIGH (ref 70–99)
Glucose-Capillary: 209 mg/dL — ABNORMAL HIGH (ref 70–99)

## 2018-11-08 LAB — HEMOGLOBIN A1C
Hgb A1c MFr Bld: 7.2 % — ABNORMAL HIGH (ref 4.8–5.6)
Hgb A1c MFr Bld: 7.3 % — ABNORMAL HIGH (ref 4.8–5.6)
Mean Plasma Glucose: 160 mg/dL
Mean Plasma Glucose: 162.81 mg/dL

## 2018-11-08 LAB — CK: Total CK: 777 U/L — ABNORMAL HIGH (ref 49–397)

## 2018-11-08 LAB — PHOSPHORUS: Phosphorus: 3.1 mg/dL (ref 2.5–4.6)

## 2018-11-08 LAB — C-REACTIVE PROTEIN: CRP: 11.4 mg/dL — ABNORMAL HIGH (ref <1.0)

## 2018-11-08 LAB — MAGNESIUM: Magnesium: 2.3 mg/dL (ref 1.7–2.4)

## 2018-11-08 LAB — FERRITIN: Ferritin: 675 ng/mL — ABNORMAL HIGH (ref 24–336)

## 2018-11-08 MED ORDER — TOCILIZUMAB 400 MG/20ML IV SOLN
800.0000 mg | Freq: Once | INTRAVENOUS | Status: AC
  Administered 2018-11-08: 800 mg via INTRAVENOUS
  Filled 2018-11-08: qty 40

## 2018-11-08 MED ORDER — SODIUM CHLORIDE 0.9% IV SOLUTION: Freq: Once | INTRAVENOUS | Status: DC

## 2018-11-08 MED ORDER — CHLORHEXIDINE GLUCONATE CLOTH 2 % EX PADS
6.0000 | MEDICATED_PAD | Freq: Every day | CUTANEOUS | Status: DC
  Administered 2018-11-08 – 2018-11-30 (×21): 6 via TOPICAL

## 2018-11-08 MED ORDER — HEPARIN SODIUM (PORCINE) 5000 UNIT/ML IJ SOLN
7500.0000 [IU] | Freq: Three times a day (TID) | INTRAMUSCULAR | Status: AC
  Administered 2018-11-08 – 2018-11-12 (×13): 7500 [IU] via SUBCUTANEOUS
  Filled 2018-11-08 (×13): qty 2

## 2018-11-08 NOTE — Progress Notes (Signed)
NAME:  Andrew Larsen, MRN:  [id number], DOB:  Sep 06, 1940, LOS: 3 ADMISSION DATE:  11/03/2018, CONSULTATION DATE: July 10 REFERRING MD: Sherral Hammers, CHIEF COMPLAINT: Dyspnea  Brief History   78 year old male with multiple medical problems admitted on July 6 with COVID-19 pneumonia.  On July 10 he developed worsening severe acute respiratory failure with hypoxemia requiring transfer to the ICU for heated high flow oxygen.  Past Medical History  OSA PAD Hypertension Hyperlipidemia GERD DM2 CKD  Significant Hospital Events   7/6 admission 7/10 transfer to ICU  Consults:  PCCM  Procedures:  L IJ CVL 7/10 >>>  Significant Diagnostic Tests:    Micro Data:   7/6 SARS COV2 > positive 7/6 blood culture >  Antimicrobials/COVID Rx  7/6 remdesivir >  7/6 solumedrol 7/10 Tocilizumab>  7/11 convalescent plasma >   Interim history/subjective:   Feels about the same this morning Slept okay Does not feel too short of breath, only when he does some activity Remains on heated high flow oxygen  Objective   Blood pressure (!) 109/43, pulse 85, temperature 97.9 F (36.6 C), temperature source Oral, resp. rate 20, height 5\' 11"  (1.803 m), weight 119.2 kg, SpO2 90 %.    FiO2 (%):  [70 %-100 %] 80 %   Intake/Output Summary (Last 24 hours) at 11/08/2018 0749 Last data filed at 11/08/2018 0100 Gross per 24 hour  Intake 749.14 ml  Output 1225 ml  Net -475.86 ml   Filed Weights   11/07/18 0413 11/07/18 0500 11/08/18 0453  Weight: 119.4 kg 119.4 kg 119.2 kg    Examination:  General:  Resting comfortably in bed HENT: NCAT OP clear PULM: Distant breath sounds, normal effort CV: RRR, no mgr GI: BS+, soft, nontender MSK: normal bulk and tone Neuro: awake, alert, no distress, MAEW   Resolved Hospital Problem list     Assessment & Plan:  ARDS due to COVID 19 pneumonia: severe hypoxemia essentially unchanged July 11, no evidence of ventilatory failure on physical exam. Continue  remdesivir Continue Actemra Continue Solu-Medrol Continue to administer oxygen via heated high flow nasal cannula Tolerate periods of hypoxemia, goal at rest is greater than 85% SaO2, with movement ideally above 75% Decision for intubation should be based on a change in mental status or physical evidence of ventilatory failure such as nasal flaring, accessory muscle use, paradoxical breathing Out of bed to chair as able Prone positioning while in bed Remain in ICU for close monitoring as he is very high risk for needing intubation Agree with convalescent plasma if the patient agrees.  Best practice:  Diet: regular diet Pain/Anxiety/Delirium protocol (if indicated): n/a VAP protocol (if indicated): n/a DVT prophylaxis: sub q heparin GI prophylaxis: famotidine Glucose control: SSI Mobility: bed rest Code Status: full Family Communication: per [facility] Disposition: remain in ICU  Labs   CBC: Recent Labs  Lab 11/03/18 1429 11/04/18 0215 11/05/18 0330 11/06/18 0330 11/07/18 0500 11/07/18 1254 11/07/18 1312  WBC 4.8 4.0 5.3 4.7 11.7*  --   --   NEUTROABS 2.8 2.0 2.9 3.8 10.1*  --   --   HGB 13.8 14.6 14.1 13.8 14.9 17.7* 15.6  HCT 42.1 43.2 41.9 41.7 43.9 52.0 46.0  MCV 93.3 92.1 92.3 90.5 91.1  --   --   PLT 161 157 151 177 198  --   --     Basic Metabolic Panel: Recent Labs  Lab 11/04/18 0215 11/05/18 0330 11/06/18 0330 11/07/18 0500 11/07/18 1254 11/07/18 1312 11/07/18 1335 11/08/18  0500  NA 136 136 131* 131* 128* 129*  --  134*  K 4.2 3.8 4.2 4.3 3.9 4.0  --  4.1  CL 101 99 94* 94*  --   --   --  98  CO2 27 26 23 22   --   --   --  25  GLUCOSE 118* 124* 285* 295*  --   --   --  164*  BUN 22 24* 28* 39*  --   --   --  47*  CREATININE 1.63* 1.64* 1.56* 1.65*  --   --   --  1.62*  CALCIUM 8.5* 8.3* 8.2* 8.6*  --   --   --  8.5*  MG  --   --   --   --   --   --  2.1 2.3  PHOS  --   --   --   --   --   --   --  3.1   GFR: Estimated Creatinine Clearance: 51 mL/min  (A) (by C-G formula based on SCr of 1.62 mg/dL (H)). Recent Labs  Lab 11/03/18 1428  11/03/18 1429 11/04/18 0215 11/05/18 0330 11/06/18 0330 11/07/18 0500  PROCALCITON <0.10  --   --   --   --   --   --   WBC  --    < > 4.8 4.0 5.3 4.7 11.7*  LATICACIDVEN  --   --  0.7  --   --   --   --    < > = values in this interval not displayed.    Liver Function Tests: Recent Labs  Lab 11/04/18 0215 11/05/18 0330 11/06/18 0330 11/07/18 0500 11/08/18 0500  AST 58* 67* 75* 83* 78*  ALT 41 42 51* 69* 77*  ALKPHOS 76 67 73 71 68  BILITOT 0.3 0.4 0.6 0.3 0.5  PROT 6.8 6.4* 6.9 7.4 6.9  ALBUMIN 3.4* 3.2* 3.3* 3.5 3.0*   No results for input(s): LIPASE, AMYLASE in the last 168 hours. No results for input(s): AMMONIA in the last 168 hours.  ABG    Component Value Date/Time   PHART 7.459 (H) 11/07/2018 1312   PCO2ART 30.5 (L) 11/07/2018 1312   PO2ART 46.0 (L) 11/07/2018 1312   HCO3 21.7 11/07/2018 1312   TCO2 23 11/07/2018 1312   ACIDBASEDEF 1.0 11/07/2018 1312   O2SAT 85.0 11/07/2018 1312     Coagulation Profile: No results for input(s): INR, PROTIME in the last 168 hours.  Cardiac Enzymes: Recent Labs  Lab 11/08/18 0500  CKTOTAL 777*    HbA1C: Hgb A1c MFr Bld  Date/Time Value Ref Range Status  11/07/2018 05:00 AM 7.2 (H) 4.8 - 5.6 % Final    Comment:    (NOTE)         Prediabetes: 5.7 - 6.4         Diabetes: >6.4         Glycemic control for adults with diabetes: <7.0   04/28/2018 03:00 PM 6.7 (H) 4.8 - 5.6 % Final    Comment:    (NOTE) Pre diabetes:          5.7%-6.4% Diabetes:              >6.4% Glycemic control for   <7.0% adults with diabetes     CBG: Recent Labs  Lab 11/07/18 1617 11/07/18 2014 11/08/18 0010 11/08/18 0336 11/08/18 0735  GLUCAP 294* 263* 209* 171* 150*       Critical care time: 54  mintues     Roselie Awkward, MD [place] [facility] Pager: [phone] Cell: [phone] If no response, call [phone]

## 2018-11-08 NOTE — Plan of Care (Signed)
Regular BM

## 2018-11-08 NOTE — Progress Notes (Signed)
PROGRESS NOTE  Andrew Larsen SHF:[id number] DOB: [birth year] DOA: 11/03/2018  PCP: Velna Hatchet, MD  Brief History/Interval Summary: 78 y.o.BM PMHx  T2DM, PAD, HTN, OSA, and BPH who presented with dry cough and shortness of breath with positive covid exposures. He tested positive for covid and had bilateral CXR infiltrates, admitted to [facility] and developed hypoxia.  He was initially admitted to the progressive care unit.  However his respiratory status continued to get worse and he was transferred to the intensive care unit.  Reason for Visit: Acute respiratory disease due to COVID-19  Consultants: Pulmonology  Procedures: Central line placement into the left internal jugular 7/10  Antibiotics: Anti-infectives (From admission, onward)   Start     Dose/Rate Route Frequency Ordered Stop   11/08/18 1330  remdesivir 100 mg in sodium chloride 0.9 % 250 mL IVPB     100 mg 500 mL/hr over 30 Minutes Intravenous Every 24 hours 11/07/18 1229 11/12/18 1329   11/07/18 1330  remdesivir 200 mg in sodium chloride 0.9 % 250 mL IVPB     200 mg 500 mL/hr over 30 Minutes Intravenous Once 11/07/18 1229 11/07/18 1457       Subjective/Interval History: Patient states that he continues to have shortness of breath.  Continues to have a dry cough.  He feels fatigued.  No nausea vomiting.  Has been urinating.  Denies any chest pain.    Assessment/Plan:  Acute Hypoxic Resp. Failure due to Acute Covid 19 Viral Illness      Component Value Date/Time   PHART 7.459 (H) 11/07/2018 1312   PCO2ART 30.5 (L) 11/07/2018 1312   PO2ART 46.0 (L) 11/07/2018 1312   HCO3 21.7 11/07/2018 1312   TCO2 23 11/07/2018 1312   ACIDBASEDEF 1.0 11/07/2018 1312   O2SAT 85.0 11/07/2018 1312    COVID-19 Labs  Recent Labs    11/06/18 0300 11/06/18 0330 11/07/18 0500 11/08/18 0500  DDIMER  --  0.50 0.37  --   FERRITIN  --   --   --  675*  CRP 19.6*  --  12.6* 11.4*    Lab Results  Component Value Date   SARSCOV2NAA POSITIVE (A) 11/03/2018     Fever: No fever in the last 3 days.  T-max was 102.9 on 7/7. Oxygen requirements: High flow nasal cannula.  100% FiO2.  35 L/min.  Saturating in the late 80s. Antibiotics: No antibacterials Remdesivir: Day 2 Steroids: Solu-Medrol 40 mg every 8 hours Diuretics: He was given a dose of IV Lasix yesterday and has been on oral Lasix which has been held today. Actemra: 1 dose was given on 7/10.  Another dose will be repeated today 7/11. Convalescent Plasma: Patient has been consented to enroll in the research study.  He is agreeable to plasma which will be ordered today. Vitamin C and Zinc: Continue DVT Prophylaxis: Heparin 5000 units subcu every 8 hours.  Will adjust for being in the ICU as well as for CKD.  Patient's respiratory status remains tenuous.  He is requiring 100% FiO2 through high flow nasal cannula.  Chest x-ray done yesterday shows diffuse bilateral infiltrates.  CRP remains elevated at 11.4 which is better from 12.6 yesterday.  D-dimer 0.37.  Ferritin 675.  Patient is on Remdesivir and steroids.  He was agreeable to Actemra and was given 1 dose yesterday.  He will be given a second dose today.  Convalescent plasma was discussed with him and he has been enrolled into the research study.  See separate note  regarding the same.  Convalescent plasma has been ordered.  Prone positioning as much as possible.  Incentive spirometry.  Mobilization as much as possible.  The treatment plan and use of medications and known side effects were discussed with patient.  Some of the medications used are based on case reports/anecdotal data which are not peer-reviewed and has not been studied using randomized control trials.  Complete risks and long-term side effects are unknown, however in the best clinical judgment they seem to be of some benefit.  Patient agrees with the treatment plan and want to receive these treatments as indicated.  Mild transaminitis Most  likely secondary to COVID-19.  Monitor periodically.  Diabetes mellitus type 2 in the setting of obesity Monitor CBGs.  Dose of Lantus was increased yesterday.  No additional changes to be made today.  SSI.  HbA1c 7.3.  Chronic kidney disease stage III Baseline creatinine around 1.5.  Creatinine is close to baseline.  Patient was on diuretics.  He is noted to be slightly on the dry side.  We will hold his diuretics for now.  Monitor urine output.  Monitor volume status.  Questionable history of congestive heart failure No echocardiogram reports available on epic.  Patient was on ARB and diuretics at home.  These could have been due to his CKD.  Will not do an echocardiogram at this time it is not clearly indicated.  Continue to monitor volume status.  History of coronary artery disease Stable.  No chest pain.  Continue home medications.  Essential hypertension Monitor blood pressures closely.  History of peripheral arterial disease Continue cilostazol.  History of obstructive sleep apnea Currently on high flow nasal cannula.  Cannot utilize CPAP at this time.  History of diabetic neuropathy Continue gabapentin.  Morbid obesity BMI 36.65.  Recently diagnosed inclusion body myositis That would explain the elevated CK level.  Apparently followed at [facility] by a neurologist, Dr. Warnell Forester ([phone]).   DVT Prophylaxis: Subcutaneous heparin PUD Prophylaxis: Continue Pepcid Code Status: Full code Family Communication: Discussed with the patient.  Will call his family later today. Disposition Plan: Management as outlined above.  Convalescent plasma has been ordered.  Patient's respiratory status remains tenuous.  He will remain in the ICU.   Medications:  Scheduled: . sodium chloride   Intravenous Once  . aspirin  81 mg Oral QHS  . brimonidine  1 drop Both Eyes BID  . Chlorhexidine Gluconate Cloth  6 each Topical Daily  . cilostazol  100 mg Oral BID  . famotidine  20 mg  Oral QHS  . folic acid  1 mg Oral Daily  . gabapentin  600 mg Oral TID  . heparin  5,000 Units Subcutaneous Q8H  . insulin aspart  0-20 Units Subcutaneous Q4H  . insulin glargine  40 Units Subcutaneous QHS  . Ipratropium-Albuterol  1 puff Inhalation Q6H  . latanoprost  1 drop Both Eyes QHS  . mouth rinse  15 mL Mouth Rinse BID  . methylPREDNISolone (SOLU-MEDROL) injection  40 mg Intravenous TID  . oxybutynin  5 mg Oral BID  . sodium chloride flush  3 mL Intravenous Q12H  . tamsulosin  0.4 mg Oral Daily  . thiamine  100 mg Oral Daily  . timolol  1 drop Both Eyes BID  . vitamin C  500 mg Oral Daily  . zinc sulfate  220 mg Oral Daily   Continuous: . remdesivir 100 mg in NS 250 mL    . tocilizumab (ACTEMRA) IV  WTU:[id number], guaiFENesin-dextromethorphan, ondansetron **OR** ondansetron (ZOFRAN) IV, senna-docusate   Objective:  Vital Signs  Vitals:   11/08/18 0453 11/08/18 0536 11/08/18 0732 11/08/18 0800  BP:    139/76  Pulse:  85  (!) 101  Resp:  20  19  Temp:    98.6 F (37 C)  TempSrc:    Oral  SpO2:  90% (!) 86% (!) 85%  Weight: 119.2 kg     Height:        Intake/Output Summary (Last 24 hours) at 11/08/2018 1032 Last data filed at 11/08/2018 0900 Gross per 24 hour  Intake 749.14 ml  Output 1525 ml  Net -775.86 ml   Filed Weights   11/07/18 0413 11/07/18 0500 11/08/18 0453  Weight: 119.4 kg 119.4 kg 119.2 kg    General appearance: Awake alert.  In no distress.  Morbidly obese Resp: Noted to be tachypneic at rest.  Coarse breath sounds bilaterally.  Crackles bilaterally more so in the bases.  No wheezing or rhonchi. Cardio: S1-S2 is regular. Mildly tachycardic.  No S3 or S4.  No rubs or bruit. GI: Abdomen is soft.  Nontender nondistended.  Bowel sounds are present normal.  No masses organomegaly Extremities: No edema.  Full range of motion of lower extremities. Neurologic: Alert and oriented x3.  No focal neurological deficits.    Lab Results:  Data  Reviewed: I have personally reviewed following labs and imaging studies  CBC: Recent Labs  Lab 11/03/18 1429 11/04/18 0215 11/05/18 0330 11/06/18 0330 11/07/18 0500 11/07/18 1254 11/07/18 1312  WBC 4.8 4.0 5.3 4.7 11.7*  --   --   NEUTROABS 2.8 2.0 2.9 3.8 10.1*  --   --   HGB 13.8 14.6 14.1 13.8 14.9 17.7* 15.6  HCT 42.1 43.2 41.9 41.7 43.9 52.0 46.0  MCV 93.3 92.1 92.3 90.5 91.1  --   --   PLT 161 157 151 177 198  --   --     Basic Metabolic Panel: Recent Labs  Lab 11/04/18 0215 11/05/18 0330 11/06/18 0330 11/07/18 0500 11/07/18 1254 11/07/18 1312 11/07/18 1335 11/08/18 0500  NA 136 136 131* 131* 128* 129*  --  134*  K 4.2 3.8 4.2 4.3 3.9 4.0  --  4.1  CL 101 99 94* 94*  --   --   --  98  CO2 27 26 23 22   --   --   --  25  GLUCOSE 118* 124* 285* 295*  --   --   --  164*  BUN 22 24* 28* 39*  --   --   --  47*  CREATININE 1.63* 1.64* 1.56* 1.65*  --   --   --  1.62*  CALCIUM 8.5* 8.3* 8.2* 8.6*  --   --   --  8.5*  MG  --   --   --   --   --   --  2.1 2.3  PHOS  --   --   --   --   --   --   --  3.1    GFR: Estimated Creatinine Clearance: 51 mL/min (A) (by C-G formula based on SCr of 1.62 mg/dL (H)).  Liver Function Tests: Recent Labs  Lab 11/04/18 0215 11/05/18 0330 11/06/18 0330 11/07/18 0500 11/08/18 0500  AST 58* 67* 75* 83* 78*  ALT 41 42 51* 69* 77*  ALKPHOS 76 67 73 71 68  BILITOT 0.3 0.4 0.6 0.3 0.5  PROT 6.8 6.4* 6.9 7.4 6.9  ALBUMIN 3.4* 3.2* 3.3* 3.5 3.0*     Cardiac Enzymes: Recent Labs  Lab 11/08/18 0500  CKTOTAL 777*    HbA1C: Recent Labs    11/07/18 0500 11/08/18 0500  HGBA1C 7.2* 7.3*    CBG: Recent Labs  Lab 11/07/18 1617 11/07/18 2014 11/08/18 0010 11/08/18 0336 11/08/18 0735  GLUCAP 294* 263* 209* 171* 150*    Lipid Profile: Recent Labs    11/07/18 0500 11/08/18 0500  CHOL 106 95  HDL 34* 35*  LDLCALC 51 39  TRIG 105 104  CHOLHDL 3.1 2.7    Anemia Panel: Recent Labs    11/08/18 0500  FERRITIN  675*    Recent Results (from the past 240 hour(s))  Culture, blood (routine x 2)     Status: None (Preliminary result)   Collection Time: 11/03/18  2:29 PM   Specimen: BLOOD  Result Value Ref Range Status   Specimen Description   Final    BLOOD RIGHT ANTECUBITAL Performed at [facility], [place] [address], [place], [place] [address]    Special Requests   Final    BOTTLES DRAWN AEROBIC AND ANAEROBIC Blood Culture adequate volume Performed at [facility] [address], [place], [place] [address]    Culture   Final    NO GROWTH 4 DAYS Performed at [facility], [place] [address], [place], [place] [address]    Report Status PENDING  Incomplete  Culture, blood (routine x 2)     Status: None (Preliminary result)   Collection Time: 11/03/18  2:29 PM   Specimen: BLOOD RIGHT HAND  Result Value Ref Range Status   Specimen Description   Final    BLOOD RIGHT HAND Performed at [facility] [address], [place], [place] [address]    Special Requests   Final    BOTTLES DRAWN AEROBIC AND ANAEROBIC Blood Culture adequate volume Performed at [place] [facility] [address], [place], [place] [address]    Culture   Final    NO GROWTH 4 DAYS Performed at [facility], [place] [address], [place], [place] [address]    Report Status PENDING  Incomplete  SARS Coronavirus 2 (CEPHEID- Performed in [facility] hospital lab), Hosp Order     Status: Abnormal   Collection Time: 11/03/18  2:29 PM   Specimen: Nasopharyngeal Swab  Result Value Ref Range Status   SARS Coronavirus 2 POSITIVE (A) NEGATIVE Final    Comment: RESULT CALLED TO, READ BACK BY AND VERIFIED WITH: ZULETA,C AT 1600 ON 11/03/2018 BY JPM (NOTE) If result is NEGATIVE SARS-CoV-2 target nucleic acids are NOT DETECTED. The SARS-CoV-2 RNA is generally detectable in upper and lower  respiratory specimens during the acute phase of infection. The lowest   concentration of SARS-CoV-2 viral copies this assay can detect is 250  copies / mL. A negative result does not preclude SARS-CoV-2 infection  and should not be used as the sole basis for treatment or other  patient management decisions.  A negative result may occur with  improper specimen collection / handling, submission of specimen other  than nasopharyngeal swab, presence of viral mutation(s) within the  areas targeted by this assay, and inadequate number of viral copies  (<250 copies / mL). A negative result must be combined with clinical  observations, patient history, and epidemiological information. If result is POSITIVE SARS-CoV-2 target nucleic acids are DETECTED. T he SARS-CoV-2 RNA is generally detectable in upper and lower  respiratory specimens during the acute phase of infection.  Positive  results are indicative of active infection with SARS-CoV-2.  Clinical  correlation with patient history and other diagnostic information is  necessary to determine patient infection status.  Positive results do  not rule out bacterial infection or co-infection with other viruses. If result is PRESUMPTIVE POSTIVE SARS-CoV-2 nucleic acids MAY BE PRESENT.   A presumptive positive result was obtained on the submitted specimen  and confirmed on repeat testing.  While 2019 novel coronavirus  (SARS-CoV-2) nucleic acids may be present in the submitted sample  additional confirmatory testing may be necessary for epidemiological  and / or clinical management purposes  to differentiate between  SARS-CoV-2 and other Sarbecovirus currently known to infect humans.  If clinically indicated additional testing with an alternate test  methodology [phone]) is  advised. The SARS-CoV-2 RNA is generally  detectable in upper and lower respiratory specimens during the acute  phase of infection. The expected result is Negative. Fact Sheet for Patients:  StrictlyIdeas.no Fact Sheet  for Healthcare Providers: BankingDealers.co.za This test is not yet approved or cleared by the [place] FDA and has been authorized for detection and/or diagnosis of SARS-CoV-2 by FDA under an Emergency Use Authorization (EUA).  This EUA will remain in effect (meaning this test can be used) for the duration of the COVID-19 declaration under Section 564(b)(1) of the Act, 21 U.S.C. section 360bbb-3(b)(1), unless the authorization is terminated or revoked sooner. Performed at [facility], [place] [address] [place], [place] [address]       Radiology Studies: Dg Chest Port 1 View  Result Date: 11/07/2018 CLINICAL DATA:  Central line placement, confirm positioning. EXAM: PORTABLE CHEST 1 VIEW COMPARISON:  Numerous chest radiographs, most recently November 07, 2018, CTA chest Sep 19, 2015. FINDINGS: Interval placement of a left internal jugular approach central venous catheter. Catheter tip terminates near the confluence of the brachiocephalic vein and superior vena cava, could be advanced to insure positioning within the lower SVC/cavoatrial junction. Widespread bilateral airspace opacities are again seen most pronounced in the right mid and lower lung. No pneumothorax or effusion is present. Cardiac silhouette remains enlarged. Cardiomediastinal contours are essentially unchanged from prior studies. No acute osseous or soft tissue abnormality. IMPRESSION: 1. Interval placement of a left internal jugular approach central venous catheter. Catheter tip terminates near the confluence of the brachiocephalic vein and superior vena cava, could be advanced 2-3 cm to ensure positioning within the lower SVC/cavoatrial junction. 2. No procedural complication identified. 3. Unchanged diffuse bilateral airspace disease. Electronically Signed   By: MD Lovena Le   On: 11/07/2018 19:06   Dg Chest Port 1 View  Result Date: 11/07/2018 CLINICAL DATA:  Shortness of breath  EXAM: PORTABLE CHEST 1 VIEW COMPARISON:  November 05, 2018 FINDINGS: The heart size and mediastinal contours are stable. The heart size is enlarged. Hazy opacities are identified in the right lung and to a lesser degree in the left lung, worse compared to prior exam. There is no pleural effusion. The visualized skeletal structures are unremarkable. IMPRESSION: Bilateral pulmonary infiltrates are slightly worse compared to prior exam of November 05, 2018. Electronically Signed   By: Abelardo Diesel M.D.   On: 11/07/2018 12:10       LOS: 3 days   [facility] Pager on [email or web address]  11/08/2018, 10:32 AM

## 2018-11-08 NOTE — Research (Signed)
Clearance Coots, MD, consented Subject Andrew Larsen (male, Date of Birth 12/12/1940, 78 y.o.) and with diagnosis of COVID-19, in the [facility] Expanded Access Program (EAP) Research Protocol for [employer] against COVID-19.  The consent took place under following circumstances.   Subject Capacity assessed by this investigator as:  Presence of adequate emotional and mental capacity to consent with normal ability to read and write.  Consent took place in the following setting(s):  In-room, face to face   The following were present for the consent process:  Investigator   A copy of the cover letter and signed consent document was provided to subject/LAR.  The original signed consent document has been placed in the subject's physical chart and will be scanned into the electronic medical record upon discharge.  Statement of acknowledgement that the following was discussed with the subject/LAR:    1) Discussed the purpose of the research and procedures  2) Discussed risks and benefits and uncertainties of study participation 3) Discussed subject's responsibilities  4) Discussed the measures in place to maintain subject's confidentiality while a participant on the trial  5) Discussed alternatives to study participation.   6) Discussed study participation is voluntary and that the subject's care would not be jeopardized if they declined participation in the study.   7) Discussed freedom to withdraw at any time.   8) All subject/LAR questions were answered to their satisfaction.   9) In case of emergency consent, investigator agreed to discuss with subject/LAR at earliest available opportunity when the subject stabilizes and/or LAR can be located.     Final Investigator Signature  [place], [place] [phone]   Date: 11/08/2018 and 10:26 AM

## 2018-11-09 LAB — MAGNESIUM: Magnesium: 2.5 mg/dL — ABNORMAL HIGH (ref 1.7–2.4)

## 2018-11-09 LAB — GLUCOSE, CAPILLARY
Glucose-Capillary: 156 mg/dL — ABNORMAL HIGH (ref 70–99)
Glucose-Capillary: 161 mg/dL — ABNORMAL HIGH (ref 70–99)
Glucose-Capillary: 167 mg/dL — ABNORMAL HIGH (ref 70–99)
Glucose-Capillary: 233 mg/dL — ABNORMAL HIGH (ref 70–99)
Glucose-Capillary: 273 mg/dL — ABNORMAL HIGH (ref 70–99)
Glucose-Capillary: 275 mg/dL — ABNORMAL HIGH (ref 70–99)

## 2018-11-09 LAB — CBC WITH DIFFERENTIAL/PLATELET
Abs Immature Granulocytes: 0.24 10*3/uL — ABNORMAL HIGH (ref 0.00–0.07)
Basophils Absolute: 0 10*3/uL (ref 0.0–0.1)
Basophils Relative: 0 %
Eosinophils Absolute: 0 10*3/uL (ref 0.0–0.5)
Eosinophils Relative: 0 %
HCT: 42.1 % (ref 39.0–52.0)
Hemoglobin: 13.9 g/dL (ref 13.0–17.0)
Immature Granulocytes: 2 %
Lymphocytes Relative: 8 %
Lymphs Abs: 1 10*3/uL (ref 0.7–4.0)
MCH: 29.6 pg (ref 26.0–34.0)
MCHC: 33 g/dL (ref 30.0–36.0)
MCV: 89.6 fL (ref 80.0–100.0)
Monocytes Absolute: 0.9 10*3/uL (ref 0.1–1.0)
Monocytes Relative: 7 %
Neutro Abs: 10.3 10*3/uL — ABNORMAL HIGH (ref 1.7–7.7)
Neutrophils Relative %: 83 %
Platelets: 278 10*3/uL (ref 150–400)
RBC: 4.7 MIL/uL (ref 4.22–5.81)
RDW: 13.1 % (ref 11.5–15.5)
WBC: 12.4 10*3/uL — ABNORMAL HIGH (ref 4.0–10.5)
nRBC: 0 % (ref 0.0–0.2)

## 2018-11-09 LAB — BPAM FFP
Blood Product Expiration Date: 202007121522
ISSUE DATE / TIME: 202007111528
Unit Type and Rh: 6200

## 2018-11-09 LAB — COMPREHENSIVE METABOLIC PANEL
ALT: 91 U/L — ABNORMAL HIGH (ref 0–44)
AST: 77 U/L — ABNORMAL HIGH (ref 15–41)
Albumin: 3 g/dL — ABNORMAL LOW (ref 3.5–5.0)
Alkaline Phosphatase: 68 U/L (ref 38–126)
Anion gap: 11 (ref 5–15)
BUN: 42 mg/dL — ABNORMAL HIGH (ref 8–23)
CO2: 26 mmol/L (ref 22–32)
Calcium: 8.5 mg/dL — ABNORMAL LOW (ref 8.9–10.3)
Chloride: 100 mmol/L (ref 98–111)
Creatinine, Ser: 1.44 mg/dL — ABNORMAL HIGH (ref 0.61–1.24)
GFR calc Af Amer: 54 mL/min — ABNORMAL LOW (ref >60)
GFR calc non Af Amer: 47 mL/min — ABNORMAL LOW (ref >60)
Glucose, Bld: 175 mg/dL — ABNORMAL HIGH (ref 70–99)
Potassium: 3.9 mmol/L (ref 3.5–5.1)
Sodium: 137 mmol/L (ref 135–145)
Total Bilirubin: 0.4 mg/dL (ref 0.3–1.2)
Total Protein: 6.5 g/dL (ref 6.5–8.1)

## 2018-11-09 LAB — D-DIMER, QUANTITATIVE: D-Dimer, Quant: 2.28 ug/mL-FEU — ABNORMAL HIGH (ref 0.00–0.50)

## 2018-11-09 LAB — PREPARE FRESH FROZEN PLASMA: Unit division: 0

## 2018-11-09 LAB — FERRITIN: Ferritin: 667 ng/mL — ABNORMAL HIGH (ref 24–336)

## 2018-11-09 LAB — C-REACTIVE PROTEIN: CRP: 6 mg/dL — ABNORMAL HIGH (ref <1.0)

## 2018-11-09 LAB — CK: Total CK: 422 U/L — ABNORMAL HIGH (ref 49–397)

## 2018-11-09 LAB — PHOSPHORUS: Phosphorus: 2.7 mg/dL (ref 2.5–4.6)

## 2018-11-09 MED ORDER — IPRATROPIUM-ALBUTEROL 20-100 MCG/ACT IN AERS
1.0000 | INHALATION_SPRAY | Freq: Four times a day (QID) | RESPIRATORY_TRACT | Status: DC | PRN
  Administered 2018-11-09: 1 via RESPIRATORY_TRACT
  Filled 2018-11-09: qty 4

## 2018-11-09 MED ORDER — BENZONATATE 100 MG PO CAPS
200.0000 mg | ORAL_CAPSULE | Freq: Three times a day (TID) | ORAL | Status: DC | PRN
  Administered 2018-11-09 – 2018-11-21 (×8): 200 mg via ORAL
  Filled 2018-11-09 (×10): qty 2

## 2018-11-09 MED ORDER — IPRATROPIUM-ALBUTEROL 20-100 MCG/ACT IN AERS
2.0000 | INHALATION_SPRAY | Freq: Four times a day (QID) | RESPIRATORY_TRACT | Status: DC
  Administered 2018-11-09 – 2018-12-01 (×86): 2 via RESPIRATORY_TRACT
  Filled 2018-11-09: qty 4

## 2018-11-09 MED ORDER — AMLODIPINE BESYLATE 5 MG PO TABS
5.0000 mg | ORAL_TABLET | Freq: Every day | ORAL | Status: DC
  Administered 2018-11-09 – 2018-11-11 (×3): 5 mg via ORAL
  Filled 2018-11-09 (×3): qty 1

## 2018-11-09 MED ORDER — HYDROCOD POLST-CPM POLST ER 10-8 MG/5ML PO SUER
5.0000 mL | Freq: Two times a day (BID) | ORAL | Status: DC | PRN
  Administered 2018-11-09 – 2018-11-28 (×11): 5 mL via ORAL
  Filled 2018-11-09 (×11): qty 5

## 2018-11-09 MED ORDER — HYDRALAZINE HCL 20 MG/ML IJ SOLN
10.0000 mg | Freq: Four times a day (QID) | INTRAMUSCULAR | Status: DC | PRN
  Administered 2018-11-10 – 2018-11-11 (×3): 10 mg via INTRAVENOUS
  Filled 2018-11-09 (×3): qty 1

## 2018-11-09 NOTE — Progress Notes (Signed)
PROGRESS NOTE  Andrew Larsen WCH:[id number] DOB: 1940-08-19 DOA: 11/03/2018  PCP: Velna Hatchet, MD  Brief History/Interval Summary: 78 y.o.BM PMHx  T2DM, PAD, HTN, OSA, and BPH who presented with dry cough and shortness of breath with positive covid exposures. He tested positive for covid and had bilateral CXR infiltrates, admitted to [facility] and developed hypoxia.  He was initially admitted to the progressive care unit.  However his respiratory status continued to get worse and he was transferred to the intensive care unit.  Reason for Visit: Acute respiratory disease due to COVID-19  Consultants: Pulmonology  Procedures: Central line placement into the left internal jugular 7/10  Antibiotics: Anti-infectives (From admission, onward)   Start     Dose/Rate Route Frequency Ordered Stop   11/08/18 1330  remdesivir 100 mg in sodium chloride 0.9 % 250 mL IVPB     100 mg 500 mL/hr over 30 Minutes Intravenous Every 24 hours 11/07/18 1229 11/12/18 1329   11/07/18 1330  remdesivir 200 mg in sodium chloride 0.9 % 250 mL IVPB     200 mg 500 mL/hr over 30 Minutes Intravenous Once 11/07/18 1229 11/08/18 1529       Subjective/Interval History: Patient states that he continues to have shortness of breath although he feels slightly better this morning.  Continues to have a dry cough.  Was able to rest some overnight.  Unable to do prone positioning.  Feels fatigued.      Assessment/Plan:  Acute Hypoxic Resp. Failure due to Acute Covid 19 Viral Illness      Component Value Date/Time   PHART 7.459 (H) 11/07/2018 1312   PCO2ART 30.5 (L) 11/07/2018 1312   PO2ART 46.0 (L) 11/07/2018 1312   HCO3 21.7 11/07/2018 1312   TCO2 23 11/07/2018 1312   ACIDBASEDEF 1.0 11/07/2018 1312   O2SAT 85.0 11/07/2018 1312    COVID-19 Labs  Recent Labs    11/07/18 0500 11/08/18 0500 11/09/18 0449  DDIMER 0.37  --  2.28*  FERRITIN  --  675* 667*  CRP 12.6* 11.4* 6.0*    Lab Results  Component  Value Date   SARSCOV2NAA POSITIVE (A) 11/03/2018     Fever: No fever in the last 4 days.  T-max was 102.9 on 7/7. Oxygen requirements: Continues to be on heated HFNC.  30 L/min.  80% FiO2.  Saturating in the late 80s to early 90s.  Antibiotics: No antibacterials Remdesivir: Day 3 Steroids: Solu-Medrol 40 mg every 8 hours Diuretics: Lasix has been held for the last 48 hours.   Actemra: Given on 7/10 and 7/11 Convalescent Plasma: Given on 7/11  Vitamin C and Zinc: Continue DVT Prophylaxis: Heparin 7500 units subcu every 8 hours.   Patient's respiratory status remains tenuous though I feel that he is slightly better today.  He is now down to 80% FiO2.  His CRP has improved to 6.0.  Ferritin 667.  D-dimer 2.28.  Continue steroids.  Continue Remdesivir.  He has been given 2 doses of Actemra.  He is also received convalescent plasma.  Incentive spirometry has been encouraged.  Out of bed to chair today.  Prone positioning if possible.  Mild leukocytosis is likely due to steroids.  The treatment plan and use of medications and known side effects were discussed with patient.  Some of the medications used are based on case reports/anecdotal data which are not peer-reviewed and has not been studied using randomized control trials.  Complete risks and long-term side effects are unknown, however in the  best clinical judgment they seem to be of some benefit.  Patient agrees with the treatment plan and want to receive these treatments as indicated.  Patient was given steroids and Actemra.  Mild transaminitis Most likely secondary to COVID-19.  Continue to monitor periodically.  Stable for the most part.  Diabetes mellitus type 2 in the setting of obesity CBGs are reasonably well controlled.  Continue Lantus SSI.  HbA1c 7.3.    Chronic kidney disease stage III Baseline creatinine around 1.5.  Creatinine is close to baseline.  Monitor urine output.  Diuretics on hold.  May need to consider reinitiating  depending on his respiratory status tomorrow.  He is however in negative fluid balance.  Able.  Questionable history of congestive heart failure No echocardiogram reports available on epic.  Patient was on ARB and diuretics at home.  These could have been due to his CKD.  Will not do an echocardiogram at this time it is not clearly indicated.  Continue to monitor volume status.  This can be further pursued in the outpatient setting.  History of coronary artery disease Stable.  No chest pain.  Continue home medications.  Essential hypertension Blood pressure noted to be elevated.  Probably due to steroids. use hydralazine as needed.  We are holding is diuretic as well as ARB.  Could consider putting him on amlodipine.  History of peripheral arterial disease Continue cilostazol.  History of obstructive sleep apnea Currently on high flow nasal cannula.  Cannot utilize CPAP at this time.  History of diabetic neuropathy Continue gabapentin.  Morbid obesity BMI 36.65.  Recently diagnosed inclusion body myositis That would explain the elevated CK level.  Apparently followed at [facility] by a neurologist, Dr. Warnell Forester ([phone]).  CK level has improved today.   DVT Prophylaxis: Subcutaneous heparin PUD Prophylaxis: Continue Pepcid Code Status: Full code Family Communication: Discussed with the patient.  Discussed with his daughter yesterday.  We will call her again today. Disposition Plan: Remains tenuous.  Continue ICU monitoring.     Medications:  Scheduled: . sodium chloride   Intravenous Once  . aspirin  81 mg Oral QHS  . brimonidine  1 drop Both Eyes BID  . Chlorhexidine Gluconate Cloth  6 each Topical Daily  . cilostazol  100 mg Oral BID  . famotidine  20 mg Oral QHS  . folic acid  1 mg Oral Daily  . gabapentin  600 mg Oral TID  . heparin  7,500 Units Subcutaneous Q8H  . insulin aspart  0-20 Units Subcutaneous Q4H  . insulin glargine  40 Units Subcutaneous QHS  .  latanoprost  1 drop Both Eyes QHS  . mouth rinse  15 mL Mouth Rinse BID  . methylPREDNISolone (SOLU-MEDROL) injection  40 mg Intravenous TID  . oxybutynin  5 mg Oral BID  . sodium chloride flush  3 mL Intravenous Q12H  . tamsulosin  0.4 mg Oral Daily  . thiamine  100 mg Oral Daily  . timolol  1 drop Both Eyes BID  . vitamin C  500 mg Oral Daily  . zinc sulfate  220 mg Oral Daily   Continuous: . remdesivir 100 mg in NS 250 mL Stopped (11/08/18 2125)   VEL:FYBOFBPZWCHEN, guaiFENesin-dextromethorphan, Ipratropium-Albuterol, ondansetron **OR** ondansetron (ZOFRAN) IV, senna-docusate   Objective:  Vital Signs  Vitals:   11/09/18 0800 11/09/18 0900 11/09/18 1000 11/09/18 1100  BP: (!) 168/72 (!) 165/99 (!) 184/81 (!) 154/87  Pulse: 89 (!) 102 93 96  Resp: (!) 22 (!)  25 17 (!) 24  Temp: 97.9 F (36.6 C)     TempSrc: Oral     SpO2: (!) 84% (!) 86% 92% 93%  Weight:      Height:        Intake/Output Summary (Last 24 hours) at 11/09/2018 1150 Last data filed at 11/09/2018 0900 Gross per 24 hour  Intake 1037.33 ml  Output 1801 ml  Net -763.67 ml   Filed Weights   11/07/18 0500 11/08/18 0453 11/09/18 0500  Weight: 119.4 kg 119.2 kg 118.2 kg    General appearance: Awake alert.  In no distress.  Morbidly obese Resp: Tachypneic at rest.  No use of accessory muscles.  Coarse breath sounds bilaterally.  Crackles bilateral bases.  Mild end expiratory wheezing noted.  No rhonchi.   Cardio: S1-S2 is normal regular.  No S3-S4.  No rubs murmurs or bruit GI: Abdomen is soft.  Nontender nondistended.  Bowel sounds are present normal.  No masses organomegaly Extremities: No edema.   Neurologic: Alert and oriented x3.  No focal neurological deficits.    Lab Results:  Data Reviewed: I have personally reviewed following labs and imaging studies  CBC: Recent Labs  Lab 11/04/18 0215 11/05/18 0330 11/06/18 0330 11/07/18 0500 11/07/18 1254 11/07/18 1312 11/09/18 0449  WBC 4.0 5.3 4.7  11.7*  --   --  12.4*  NEUTROABS 2.0 2.9 3.8 10.1*  --   --  10.3*  HGB 14.6 14.1 13.8 14.9 17.7* 15.6 13.9  HCT 43.2 41.9 41.7 43.9 52.0 46.0 42.1  MCV 92.1 92.3 90.5 91.1  --   --  89.6  PLT 157 151 177 198  --   --  540    Basic Metabolic Panel: Recent Labs  Lab 11/05/18 0330 11/06/18 0330 11/07/18 0500 11/07/18 1254 11/07/18 1312 11/07/18 1335 11/08/18 0500 11/09/18 0449  NA 136 131* 131* 128* 129*  --  134* 137  K 3.8 4.2 4.3 3.9 4.0  --  4.1 3.9  CL 99 94* 94*  --   --   --  98 100  CO2 26 23 22   --   --   --  25 26  GLUCOSE 124* 285* 295*  --   --   --  164* 175*  BUN 24* 28* 39*  --   --   --  47* 42*  CREATININE 1.64* 1.56* 1.65*  --   --   --  1.62* 1.44*  CALCIUM 8.3* 8.2* 8.6*  --   --   --  8.5* 8.5*  MG  --   --   --   --   --  2.1 2.3 2.5*  PHOS  --   --   --   --   --   --  3.1 2.7    GFR: Estimated Creatinine Clearance: 57.1 mL/min (A) (by C-G formula based on SCr of 1.44 mg/dL (H)).  Liver Function Tests: Recent Labs  Lab 11/05/18 0330 11/06/18 0330 11/07/18 0500 11/08/18 0500 11/09/18 0449  AST 67* 75* 83* 78* 77*  ALT 42 51* 69* 77* 91*  ALKPHOS 67 73 71 68 68  BILITOT 0.4 0.6 0.3 0.5 0.4  PROT 6.4* 6.9 7.4 6.9 6.5  ALBUMIN 3.2* 3.3* 3.5 3.0* 3.0*     Cardiac Enzymes: Recent Labs  Lab 11/08/18 0500 11/09/18 0449  CKTOTAL 777* 422*    HbA1C: Recent Labs    11/07/18 0500 11/08/18 0500  HGBA1C 7.2* 7.3*    CBG: Recent Labs  Lab 11/08/18 1127 11/08/18 2048 11/08/18 2334 11/09/18 0411 11/09/18 0757  GLUCAP 163* 202* 204* 156* 161*    Lipid Profile: Recent Labs    11/07/18 0500 11/08/18 0500  CHOL 106 95  HDL 34* 35*  LDLCALC 51 39  TRIG 105 104  CHOLHDL 3.1 2.7    Anemia Panel: Recent Labs    11/08/18 0500 11/09/18 0449  FERRITIN 675* 667*    Recent Results (from the past 240 hour(s))  Culture, blood (routine x 2)     Status: None   Collection Time: 11/03/18  2:29 PM   Specimen: BLOOD  Result Value  Ref Range Status   Specimen Description   Final    BLOOD RIGHT ANTECUBITAL Performed at [facility], [place] [address], [place], [place] [address]    Special Requests   Final    BOTTLES DRAWN AEROBIC AND ANAEROBIC Blood Culture adequate volume Performed at [place] [address], [place], [place] [address]    Culture   Final    NO GROWTH 5 DAYS Performed at [facility], [place] [address] [place], [place] [address]    Report Status 11/08/2018 FINAL  Final  Culture, blood (routine x 2)     Status: None   Collection Time: 11/03/18  2:29 PM   Specimen: BLOOD RIGHT HAND  Result Value Ref Range Status   Specimen Description   Final    BLOOD RIGHT HAND Performed at [place] [address], [place], [place] [address]    Special Requests   Final    BOTTLES DRAWN AEROBIC AND ANAEROBIC Blood Culture adequate volume Performed at [place] [address], [place], [place] [address]    Culture   Final    NO GROWTH 5 DAYS Performed at [facility], [place] [address], [place], [place] [address]    Report Status 11/08/2018 FINAL  Final  SARS Coronavirus 2 (CEPHEID- Performed in [place] hospital lab), Hosp Order     Status: Abnormal   Collection Time: 11/03/18  2:29 PM   Specimen: Nasopharyngeal Swab  Result Value Ref Range Status   SARS Coronavirus 2 POSITIVE (A) NEGATIVE Final    Comment: RESULT CALLED TO, READ BACK BY AND VERIFIED WITH: ZULETA,C AT 1600 ON 11/03/2018 BY JPM (NOTE) If result is NEGATIVE SARS-CoV-2 target nucleic acids are NOT DETECTED. The SARS-CoV-2 RNA is generally detectable in upper and lower  respiratory specimens during the acute phase of infection. The lowest  concentration of SARS-CoV-2 viral copies this assay can detect is 250  copies / mL. A negative result does not preclude SARS-CoV-2 infection  and should not be used as the sole basis for treatment or other   patient management decisions.  A negative result may occur with  improper specimen collection / handling, submission of specimen other  than nasopharyngeal swab, presence of viral mutation(s) within the  areas targeted by this assay, and inadequate number of viral copies  (<250 copies / mL). A negative result must be combined with clinical  observations, patient history, and epidemiological information. If result is POSITIVE SARS-CoV-2 target nucleic acids are DETECTED. T he SARS-CoV-2 RNA is generally detectable in upper and lower  respiratory specimens during the acute phase of infection.  Positive  results are indicative of active infection with SARS-CoV-2.  Clinical  correlation with patient history and other diagnostic information is  necessary to determine patient infection status.  Positive results do  not rule out bacterial infection or co-infection with other viruses. If result is PRESUMPTIVE POSTIVE SARS-CoV-2 nucleic acids MAY BE PRESENT.   A presumptive positive result was obtained on the submitted specimen  and confirmed on repeat testing.  While 2019 novel coronavirus  (SARS-CoV-2) nucleic acids may be present in the submitted sample  additional confirmatory testing may be necessary for epidemiological  and / or clinical management purposes  to differentiate between  SARS-CoV-2 and other Sarbecovirus currently known to infect humans.  If clinically indicated additional testing with an alternate test  methodology [phone]) is  advised. The SARS-CoV-2 RNA is generally  detectable in upper and lower respiratory specimens during the acute  phase of infection. The expected result is Negative. Fact Sheet for Patients:  StrictlyIdeas.no Fact Sheet for Healthcare Providers: BankingDealers.co.za This test is not yet approved or cleared by the [place] FDA and has been authorized for detection and/or diagnosis of SARS-CoV-2 by  FDA under an Emergency Use Authorization (EUA).  This EUA will remain in effect (meaning this test can be used) for the duration of the COVID-19 declaration under Section 564(b)(1) of the Act, 21 U.S.C. section 360bbb-3(b)(1), unless the authorization is terminated or revoked sooner. Performed at [facility], [place] [address], [place], [place] [address]       Radiology Studies: Dg Chest Port 1 View  Result Date: 11/07/2018 CLINICAL DATA:  Central line placement, confirm positioning. EXAM: PORTABLE CHEST 1 VIEW COMPARISON:  Numerous chest radiographs, most recently November 07, 2018, CTA chest Sep 19, 2015. FINDINGS: Interval placement of a left internal jugular approach central venous catheter. Catheter tip terminates near the confluence of the brachiocephalic vein and superior vena cava, could be advanced to insure positioning within the lower SVC/cavoatrial junction. Widespread bilateral airspace opacities are again seen most pronounced in the right mid and lower lung. No pneumothorax or effusion is present. Cardiac silhouette remains enlarged. Cardiomediastinal contours are essentially unchanged from prior studies. No acute osseous or soft tissue abnormality. IMPRESSION: 1. Interval placement of a left internal jugular approach central venous catheter. Catheter tip terminates near the confluence of the brachiocephalic vein and superior vena cava, could be advanced 2-3 cm to ensure positioning within the lower SVC/cavoatrial junction. 2. No procedural complication identified. 3. Unchanged diffuse bilateral airspace disease. Electronically Signed   By: MD Lovena Le   On: 11/07/2018 19:06   Dg Chest Port 1 View  Result Date: 11/07/2018 CLINICAL DATA:  Shortness of breath EXAM: PORTABLE CHEST 1 VIEW COMPARISON:  November 05, 2018 FINDINGS: The heart size and mediastinal contours are stable. The heart size is enlarged. Hazy opacities are identified in the right lung and to a lesser  degree in the left lung, worse compared to prior exam. There is no pleural effusion. The visualized skeletal structures are unremarkable. IMPRESSION: Bilateral pulmonary infiltrates are slightly worse compared to prior exam of November 05, 2018. Electronically Signed   By: Abelardo Diesel M.D.   On: 11/07/2018 12:10       LOS: 4 days   [facility] Pager on [email or web address]  11/09/2018, 11:50 AM

## 2018-11-09 NOTE — Progress Notes (Signed)
Called and updated patient's daughter [name] on patient's status and care.  Patient and his daughter are face timing at this time.

## 2018-11-09 NOTE — Progress Notes (Signed)
NAME:  Andrew Larsen, MRN:  [id number], DOB:  1941/02/18, LOS: 4 ADMISSION DATE:  11/03/2018, CONSULTATION DATE: July 10 REFERRING MD: Sherral Hammers, CHIEF COMPLAINT: Dyspnea  Brief History   78 year old male with multiple medical problems admitted on July 6 with COVID-19 pneumonia.  On July 10 he developed worsening severe acute respiratory failure with hypoxemia requiring transfer to the ICU for heated high flow oxygen.  Past Medical History  OSA PAD Hypertension Hyperlipidemia GERD DM2 CKD  Significant Hospital Events   7/6 admission 7/10 transfer to ICU  Consults:  PCCM  Procedures:  L IJ CVL 7/10 >>>  Significant Diagnostic Tests:    Micro Data:   7/6 SARS COV2 > positive 7/6 blood culture >  Antimicrobials/COVID Rx  7/10 remdesivir >  7/6 solumedrol > 7/10 Tocilizumab> 7/11 7/11 convalescent plasma   Interim history/subjective:   Feels about the same Rested overnight Oxygenation about the same Still has cough, some mucus production  Objective   Blood pressure (!) 159/62, pulse 82, temperature (!) 97.5 F (36.4 C), temperature source Oral, resp. rate 20, height 5\' 11"  (1.803 m), weight 118.2 kg, SpO2 93 %.    FiO2 (%):  [80 %] 80 %   Intake/Output Summary (Last 24 hours) at 11/09/2018 0744 Last data filed at 11/09/2018 0511 Gross per 24 hour  Intake 1377.33 ml  Output 1801 ml  Net -423.67 ml   Filed Weights   11/07/18 0500 11/08/18 0453 11/09/18 0500  Weight: 119.4 kg 119.2 kg 118.2 kg    Examination:  General:  Resting comfortably in bed HENT: NCAT OP clear PULM: CTA B, normal effort CV: RRR, no mgr GI: BS+, soft, nontender MSK: normal bulk and tone Neuro: awake, alert, no distress, MAEW    Resolved Hospital Problem list     Assessment & Plan:  ARDS due to COVID 19 pneumonia: severe hypoxemia essentially unchanged July 11, no evidence of ventilatory failure on physical exam. Continue Remdesivir Continue Actemra Continue Solu-Medrol  Continue heated high flow via nasal cannula Tolerate periods of hypoxemia, goal at rest is greater than 85% SaO2, with movement ideally above 75% Decision for intubation should be based on a change in mental status or physical evidence of ventilatory failure such as nasal flaring, accessory muscle use, paradoxical breathing Out of bed to chair as able > encourage activity today Prone positioning while in bed  Best practice:  Diet: regular diet Pain/Anxiety/Delirium protocol (if indicated): n/a VAP protocol (if indicated): n/a DVT prophylaxis: sub q heparin GI prophylaxis: famotidine Glucose control: SSI Mobility: bed rest Code Status: full Family Communication: per [facility] Disposition: remain in ICU  Labs   CBC: Recent Labs  Lab 11/04/18 0215 11/05/18 0330 11/06/18 0330 11/07/18 0500 11/07/18 1254 11/07/18 1312 11/09/18 0449  WBC 4.0 5.3 4.7 11.7*  --   --  12.4*  NEUTROABS 2.0 2.9 3.8 10.1*  --   --  10.3*  HGB 14.6 14.1 13.8 14.9 17.7* 15.6 13.9  HCT 43.2 41.9 41.7 43.9 52.0 46.0 42.1  MCV 92.1 92.3 90.5 91.1  --   --  89.6  PLT 157 151 177 198  --   --  009    Basic Metabolic Panel: Recent Labs  Lab 11/05/18 0330 11/06/18 0330 11/07/18 0500 11/07/18 1254 11/07/18 1312 11/07/18 1335 11/08/18 0500 11/09/18 0449  NA 136 131* 131* 128* 129*  --  134* 137  K 3.8 4.2 4.3 3.9 4.0  --  4.1 3.9  CL 99 94* 94*  --   --   --  98 100  CO2 26 23 22   --   --   --  25 26  GLUCOSE 124* 285* 295*  --   --   --  164* 175*  BUN 24* 28* 39*  --   --   --  47* 42*  CREATININE 1.64* 1.56* 1.65*  --   --   --  1.62* 1.44*  CALCIUM 8.3* 8.2* 8.6*  --   --   --  8.5* 8.5*  MG  --   --   --   --   --  2.1 2.3 2.5*  PHOS  --   --   --   --   --   --  3.1 2.7   GFR: Estimated Creatinine Clearance: 57.1 mL/min (A) (by C-G formula based on SCr of 1.44 mg/dL (H)). Recent Labs  Lab 11/03/18 1428 11/03/18 1429  11/05/18 0330 11/06/18 0330 11/07/18 0500 11/09/18 0449  PROCALCITON  <0.10  --   --   --   --   --   --   WBC  --  4.8   < > 5.3 4.7 11.7* 12.4*  LATICACIDVEN  --  0.7  --   --   --   --   --    < > = values in this interval not displayed.    Liver Function Tests: Recent Labs  Lab 11/05/18 0330 11/06/18 0330 11/07/18 0500 11/08/18 0500 11/09/18 0449  AST 67* 75* 83* 78* 77*  ALT 42 51* 69* 77* 91*  ALKPHOS 67 73 71 68 68  BILITOT 0.4 0.6 0.3 0.5 0.4  PROT 6.4* 6.9 7.4 6.9 6.5  ALBUMIN 3.2* 3.3* 3.5 3.0* 3.0*   No results for input(s): LIPASE, AMYLASE in the last 168 hours. No results for input(s): AMMONIA in the last 168 hours.  ABG    Component Value Date/Time   PHART 7.459 (H) 11/07/2018 1312   PCO2ART 30.5 (L) 11/07/2018 1312   PO2ART 46.0 (L) 11/07/2018 1312   HCO3 21.7 11/07/2018 1312   TCO2 23 11/07/2018 1312   ACIDBASEDEF 1.0 11/07/2018 1312   O2SAT 85.0 11/07/2018 1312     Coagulation Profile: No results for input(s): INR, PROTIME in the last 168 hours.  Cardiac Enzymes: Recent Labs  Lab 11/08/18 0500 11/09/18 0449  CKTOTAL 777* 422*    HbA1C: Hgb A1c MFr Bld  Date/Time Value Ref Range Status  11/08/2018 05:00 AM 7.3 (H) 4.8 - 5.6 % Final    Comment:    (NOTE) Pre diabetes:          5.7%-6.4% Diabetes:              >6.4% Glycemic control for   <7.0% adults with diabetes   11/07/2018 05:00 AM 7.2 (H) 4.8 - 5.6 % Final    Comment:    (NOTE)         Prediabetes: 5.7 - 6.4         Diabetes: >6.4         Glycemic control for adults with diabetes: <7.0     CBG: Recent Labs  Lab 11/08/18 0735 11/08/18 1127 11/08/18 2048 11/08/18 2334 11/09/18 0411  GLUCAP 150* 163* 202* 204* 156*       Critical care time: 59 mintues     Roselie Awkward, MD [place] Pager: [phone] Cell: [phone] If no response, call [phone]

## 2018-11-10 ENCOUNTER — Inpatient Hospital Stay (HOSPITAL_COMMUNITY): Payer: Medicare HMO

## 2018-11-10 LAB — CBC WITH DIFFERENTIAL/PLATELET
Abs Immature Granulocytes: 0.34 10*3/uL — ABNORMAL HIGH (ref 0.00–0.07)
Basophils Absolute: 0.1 10*3/uL (ref 0.0–0.1)
Basophils Relative: 0 %
Eosinophils Absolute: 0.5 10*3/uL (ref 0.0–0.5)
Eosinophils Relative: 3 %
HCT: 43.9 % (ref 39.0–52.0)
Hemoglobin: 14.7 g/dL (ref 13.0–17.0)
Immature Granulocytes: 2 %
Lymphocytes Relative: 6 %
Lymphs Abs: 0.9 10*3/uL (ref 0.7–4.0)
MCH: 30.6 pg (ref 26.0–34.0)
MCHC: 33.5 g/dL (ref 30.0–36.0)
MCV: 91.3 fL (ref 80.0–100.0)
Monocytes Absolute: 1.3 10*3/uL — ABNORMAL HIGH (ref 0.1–1.0)
Monocytes Relative: 9 %
Neutro Abs: 11.2 10*3/uL — ABNORMAL HIGH (ref 1.7–7.7)
Neutrophils Relative %: 80 %
Platelets: 326 10*3/uL (ref 150–400)
RBC: 4.81 MIL/uL (ref 4.22–5.81)
RDW: 13.3 % (ref 11.5–15.5)
WBC: 14.3 10*3/uL — ABNORMAL HIGH (ref 4.0–10.5)
nRBC: 0 % (ref 0.0–0.2)

## 2018-11-10 LAB — COMPREHENSIVE METABOLIC PANEL
ALT: 89 U/L — ABNORMAL HIGH (ref 0–44)
AST: 54 U/L — ABNORMAL HIGH (ref 15–41)
Albumin: 3.1 g/dL — ABNORMAL LOW (ref 3.5–5.0)
Alkaline Phosphatase: 70 U/L (ref 38–126)
Anion gap: 11 (ref 5–15)
BUN: 40 mg/dL — ABNORMAL HIGH (ref 8–23)
CO2: 23 mmol/L (ref 22–32)
Calcium: 8.4 mg/dL — ABNORMAL LOW (ref 8.9–10.3)
Chloride: 104 mmol/L (ref 98–111)
Creatinine, Ser: 1.4 mg/dL — ABNORMAL HIGH (ref 0.61–1.24)
GFR calc Af Amer: 56 mL/min — ABNORMAL LOW (ref >60)
GFR calc non Af Amer: 48 mL/min — ABNORMAL LOW (ref >60)
Glucose, Bld: 203 mg/dL — ABNORMAL HIGH (ref 70–99)
Potassium: 4.2 mmol/L (ref 3.5–5.1)
Sodium: 138 mmol/L (ref 135–145)
Total Bilirubin: 0.4 mg/dL (ref 0.3–1.2)
Total Protein: 6.5 g/dL (ref 6.5–8.1)

## 2018-11-10 LAB — GLUCOSE, CAPILLARY
Glucose-Capillary: 208 mg/dL — ABNORMAL HIGH (ref 70–99)
Glucose-Capillary: 205 mg/dL — ABNORMAL HIGH (ref 70–99)
Glucose-Capillary: 191 mg/dL — ABNORMAL HIGH (ref 70–99)
Glucose-Capillary: 188 mg/dL — ABNORMAL HIGH (ref 70–99)
Glucose-Capillary: 296 mg/dL — ABNORMAL HIGH (ref 70–99)
Glucose-Capillary: 315 mg/dL — ABNORMAL HIGH (ref 70–99)

## 2018-11-10 LAB — D-DIMER, QUANTITATIVE: D-Dimer, Quant: 2.32 ug/mL-FEU — ABNORMAL HIGH (ref 0.00–0.50)

## 2018-11-10 LAB — PHOSPHORUS: Phosphorus: 2.4 mg/dL — ABNORMAL LOW (ref 2.5–4.6)

## 2018-11-10 LAB — MAGNESIUM: Magnesium: 2.5 mg/dL — ABNORMAL HIGH (ref 1.7–2.4)

## 2018-11-10 LAB — FERRITIN: Ferritin: 599 ng/mL — ABNORMAL HIGH (ref 24–336)

## 2018-11-10 LAB — C-REACTIVE PROTEIN: CRP: 3.4 mg/dL — ABNORMAL HIGH (ref <1.0)

## 2018-11-10 NOTE — Progress Notes (Signed)
Alert and oriented x 4. Afebrile. Continues on heated hi flow 40 liters at 65% FiO2. Non-productive cough noted. Transferred from chair to bed with PT. Front wheel walker used. Dyspnea on exertion noted. At rest O2 SAT 88-89%. Call bell within reach. Encouraged to report signs and symptoms to staff.

## 2018-11-10 NOTE — Progress Notes (Addendum)
NAME:  Andrew Larsen, MRN:  [id number], DOB:  1940-08-26, LOS: 5 ADMISSION DATE:  11/03/2018, CONSULTATION DATE: July 10 REFERRING MD: Sherral Hammers, CHIEF COMPLAINT: Dyspnea  Brief History   78 year old male with multiple medical problems admitted on July 6 with COVID-19 pneumonia.  On July 10 he developed worsening severe acute respiratory failure with hypoxemia requiring transfer to the ICU for heated high flow oxygen.  Past Medical History  OSA PAD Hypertension Hyperlipidemia GERD DM2 CKD  Significant Hospital Events   7/6 admission 7/10 transfer to ICU  Consults:  PCCM  Procedures:  L IJ CVL 7/10 >>>  Significant Diagnostic Tests:    Micro Data:   7/6 SARS COV2 > positive 7/6 blood culture >  Antimicrobials/COVID Rx  7/10 remdesivir >  7/6 solumedrol > 7/10 Tocilizumab> 7/11 7/11 convalescent plasma   Interim history/subjective:   Slept okay Feels okay this morning Breathing is about the same Still has periods of cough Tolerating movement better without profound oxygen desaturation   Objective   Blood pressure (!) 162/86, pulse (!) 109, temperature 97.9 F (36.6 C), temperature source Axillary, resp. rate (!) 22, height 5\' 11"  (1.803 m), weight 119.8 kg, SpO2 94 %.    FiO2 (%):  [75 %-80 %] 75 %   Intake/Output Summary (Last 24 hours) at 11/10/2018 0757 Last data filed at 11/10/2018 0600 Gross per 24 hour  Intake 1376.11 ml  Output 1275 ml  Net 101.11 ml   Filed Weights   11/08/18 0453 11/09/18 0500 11/10/18 0440  Weight: 119.2 kg 118.2 kg 119.8 kg    Examination:  General:  Resting comfortably in bed HENT: NCAT OP clear PULM: CTA B, normal effort CV: RRR, no mgr GI: BS+, soft, nontender MSK: normal bulk and tone Neuro: awake, alert, no distress, MAEW  July 13 chest x-ray images independently reviewed, morning film shows bilateral airspace disease essentially unchanged compared to the prior study but there is a suggestion of a left-sided  pneumatocele. Repeat chest x-ray images independently reviewed where the pneumatocele is less obvious.  No clear air-fluid level.  Resolved Hospital Problem list     Assessment & Plan:  ARDS due to COVID 19 pneumonia: improved oxygenation 7/13 Continue Remdesivir No further doses of Actemra plan Monitor LFTs, biomarkers Continue Solu-Medrol Continue heated high flow oxygen via nasal cannula Tolerate periods of hypoxemia, goal at rest is greater than 85% SaO2, with movement ideally above 75% Decision for intubation should be based on a change in mental status or physical evidence of ventilatory failure such as nasal flaring, accessory muscle use, paradoxical breathing Out of bed to chair as able Prone positioning while in bed  Chest X-ray image from early AM suggests a Pneumatocele Not clearly seen on repeat image later in the day Plan: Repeat CXR in AM   Best practice:  Diet: regular diet Pain/Anxiety/Delirium protocol (if indicated): n/a VAP protocol (if indicated): n/a DVT prophylaxis: sub q heparin GI prophylaxis: famotidine Glucose control: SSI Mobility: bed rest Code Status: full Family Communication: per [facility] Disposition: remain in ICU  Labs   CBC: Recent Labs  Lab 11/05/18 0330 11/06/18 0330 11/07/18 0500 11/07/18 1254 11/07/18 1312 11/09/18 0449 11/10/18 0505  WBC 5.3 4.7 11.7*  --   --  12.4* 14.3*  NEUTROABS 2.9 3.8 10.1*  --   --  10.3* 11.2*  HGB 14.1 13.8 14.9 17.7* 15.6 13.9 14.7  HCT 41.9 41.7 43.9 52.0 46.0 42.1 43.9  MCV 92.3 90.5 91.1  --   --  89.6 91.3  PLT 151 177 198  --   --  278 564    Basic Metabolic Panel: Recent Labs  Lab 11/06/18 0330 11/07/18 0500 11/07/18 1254 11/07/18 1312 11/07/18 1335 11/08/18 0500 11/09/18 0449 11/10/18 0505  NA 131* 131* 128* 129*  --  134* 137 138  K 4.2 4.3 3.9 4.0  --  4.1 3.9 4.2  CL 94* 94*  --   --   --  98 100 104  CO2 23 22  --   --   --  25 26 23   GLUCOSE 285* 295*  --   --   --  164*  175* 203*  BUN 28* 39*  --   --   --  47* 42* 40*  CREATININE 1.56* 1.65*  --   --   --  1.62* 1.44* 1.40*  CALCIUM 8.2* 8.6*  --   --   --  8.5* 8.5* 8.4*  MG  --   --   --   --  2.1 2.3 2.5* 2.5*  PHOS  --   --   --   --   --  3.1 2.7 2.4*   GFR: Estimated Creatinine Clearance: 59.1 mL/min (A) (by C-G formula based on SCr of 1.4 mg/dL (H)). Recent Labs  Lab 11/03/18 1428 11/03/18 1429  11/06/18 0330 11/07/18 0500 11/09/18 0449 11/10/18 0505  PROCALCITON <0.10  --   --   --   --   --   --   WBC  --  4.8   < > 4.7 11.7* 12.4* 14.3*  LATICACIDVEN  --  0.7  --   --   --   --   --    < > = values in this interval not displayed.    Liver Function Tests: Recent Labs  Lab 11/06/18 0330 11/07/18 0500 11/08/18 0500 11/09/18 0449 11/10/18 0505  AST 75* 83* 78* 77* 54*  ALT 51* 69* 77* 91* 89*  ALKPHOS 73 71 68 68 70  BILITOT 0.6 0.3 0.5 0.4 0.4  PROT 6.9 7.4 6.9 6.5 6.5  ALBUMIN 3.3* 3.5 3.0* 3.0* 3.1*   No results for input(s): LIPASE, AMYLASE in the last 168 hours. No results for input(s): AMMONIA in the last 168 hours.  ABG    Component Value Date/Time   PHART 7.459 (H) 11/07/2018 1312   PCO2ART 30.5 (L) 11/07/2018 1312   PO2ART 46.0 (L) 11/07/2018 1312   HCO3 21.7 11/07/2018 1312   TCO2 23 11/07/2018 1312   ACIDBASEDEF 1.0 11/07/2018 1312   O2SAT 85.0 11/07/2018 1312     Coagulation Profile: No results for input(s): INR, PROTIME in the last 168 hours.  Cardiac Enzymes: Recent Labs  Lab 11/08/18 0500 11/09/18 0449  CKTOTAL 777* 422*    HbA1C: Hgb A1c MFr Bld  Date/Time Value Ref Range Status  11/08/2018 05:00 AM 7.3 (H) 4.8 - 5.6 % Final    Comment:    (NOTE) Pre diabetes:          5.7%-6.4% Diabetes:              >6.4% Glycemic control for   <7.0% adults with diabetes   11/07/2018 05:00 AM 7.2 (H) 4.8 - 5.6 % Final    Comment:    (NOTE)         Prediabetes: 5.7 - 6.4         Diabetes: >6.4         Glycemic control for adults with diabetes:  <7.0  CBG: Recent Labs  Lab 11/09/18 1210 11/09/18 1602 11/09/18 2000 11/09/18 2330 11/10/18 0736  GLUCAP 167* 233* 275* 273* 191*      Critical care time: [place], MD [place] PCCM Pager: [phone] Cell: [phone] If no response, call [phone]

## 2018-11-10 NOTE — Progress Notes (Signed)
Physical Therapy Treatment Patient Details Name: Andrew Larsen MRN: [id number] DOB: [birth year] Today's Date: 11/10/2018    History of Present Illness Andrew Larsen is a 78 y.o. male with medical history significant of PAD, DM, HTN, BPH, OSA who presents due to SOB and nonproductive cough. Positive for Covid .  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 Reported multiple falls.    PT Comments    Patient now in ICU on heated HFNC with sats decreasing to 79% during standing activities. With seated rest breaks his sats return to 85-89% within 3-4 minutes. He can continue to benefit from PT upon discharge--?HH vs OPPT. Will depend on his tolerance.    Follow Up Recommendations  Home health PT     Equipment Recommendations  None recommended by PT    Recommendations for Other Services       Precautions / Restrictions Precautions Precautions: Fall Precaution Comments: right LE is much weaker than left and may buckle Restrictions Weight Bearing Restrictions: No    Mobility  Bed Mobility Overal bed mobility: Needs Assistance Bed Mobility: Sit to Supine       Sit to supine: Min assist   General bed mobility comments: Min A for readjusting once pt brought BLEs over EOB  Transfers Overall transfer level: Needs assistance Equipment used: Rolling walker (2 wheeled) Transfers: Sit to/from Stand Sit to Stand: Min assist;+2 physical assistance;+2 safety/equipment;From elevated surface Stand pivot transfers: Min assist;+2 safety/equipment       General transfer comment: x 4 reps with seated rests between; Pt requiring Min A for gaining balance once in standing. Min A for safety during descent. ;  Ambulation/Gait Ambulation/Gait assistance: Min guard;+2 safety/equipment Gait Distance (Feet): 4 Feet(x 3 reps; rest between) Assistive device: Rolling walker (2 wheeled) Gait  Pattern/deviations: Step-to pattern(sideways along EOB due to heated HFNC)         Stairs             Wheelchair Mobility    Modified Rankin (Stroke Patients Only)       Balance Overall balance assessment: Needs assistance Sitting-balance support: Bilateral upper extremity supported;Feet supported Sitting balance-Leahy Scale: Fair     Standing balance support: Bilateral upper extremity supported;During functional activity Standing balance-Leahy Scale: Poor Standing balance comment: Reliant on UE support                            Cognition Arousal/Alertness: Awake/alert Behavior During Therapy: WFL for tasks assessed/performed;Flat affect Overall Cognitive Status: Within Functional Limits for tasks assessed                                        Exercises Other Exercises Other Exercises: encouraged to perform AROM in supine or sitting throughout the day    General Comments General comments (skin integrity, edema, etc.): Educated on pursed lip breathing during seated rest breaks; SpO2 91-88% on 40L with FiO2 at 65% via HFNC while seated in the recliner. SpO2 flucutating between 86-79% during activity. During rest breaks at EOB, Spo2 slowly rising to 92%. HR elevated to 130 during activity and RR in 30s.      Pertinent Vitals/Pain Pain Assessment: Faces Faces Pain Scale: Hurts little more Pain Location: chest from coughing Pain Descriptors / Indicators: Discomfort Pain Intervention(s): Monitored during session  Home Living                      Prior Function            PT Goals (current goals can now be found in the care plan section) Acute Rehab PT Goals Patient Stated Goal: to feel better, home soon Time For Goal Achievement: 11/18/18 Progress towards PT goals: Not progressing toward goals - comment(now in ICU on heated HFNC with limited tolerance)    Frequency    Min 3X/week      PT Plan Current plan  remains appropriate    Co-evaluation PT/OT/SLP Co-Evaluation/Treatment: Yes Reason for Co-Treatment: For patient/therapist safety PT goals addressed during session: Mobility/safety with mobility;Strengthening/ROM OT goals addressed during session: ADL's and self-care      AM-PAC PT "6 Clicks" Mobility   Outcome Measure  Help needed turning from your back to your side while in a flat bed without using bedrails?: None Help needed moving from lying on your back to sitting on the side of a flat bed without using bedrails?: A Little Help needed moving to and from a bed to a chair (including a wheelchair)?: A Little Help needed standing up from a chair using your arms (e.g., wheelchair or bedside chair)?: A Little Help needed to walk in hospital room?: A Lot Help needed climbing 3-5 steps with a railing? : Total 6 Click Score: 16    End of Session Equipment Utilized During Treatment: Gait belt;Oxygen Activity Tolerance: Treatment limited secondary to medical complications (Comment) Patient left: in bed;with call bell/phone within reach;with nursing/sitter in room Nurse Communication: Mobility status PT Visit Diagnosis: Unsteadiness on feet (R26.81);Muscle weakness (generalized) (M62.81);Repeated falls (R29.6)     Time: 7673-4193 PT Time Calculation (min) (ACUTE ONLY): 32 min  Charges:  $Therapeutic Activity: 8-22 mins                        [employer], PT 11/10/2018, 3:37 PM

## 2018-11-10 NOTE — Progress Notes (Signed)
Occupational Therapy Treatment Patient Details Name: [name] MRN: [id number] DOB: Sep 25, 1940 Today's Date: 11/10/2018    History of present illness Andrew Larsen is a 78 y.o. male with medical history significant of PAD, DM, HTN, BPH, OSA who presents due to SOB and nonproductive cough. Positive for Covid .  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 Reported multiple falls.   OT comments  Pt continues to present with high motivated to participate in therapy. Pt performing side steps along EOB with Min A +2 and RW; performed three times with seated rest breaks inbetween. During activity, SpO2 ranging between 86-79% on 40L with FiO2 at 65% via heated HFNC. Continue to recommend dc to home with HHOT and will continue to follow acutely as admitted.    Follow Up Recommendations  Home health OT;Supervision/Assistance - 24 hour    Equipment Recommendations  3 in 1 bedside commode;Tub/shower seat    Recommendations for Other Services PT consult    Precautions / Restrictions Precautions Precautions: Fall Precaution Comments: right LE is much weaker than left and may buckle Restrictions Weight Bearing Restrictions: No       Mobility Bed Mobility Overal bed mobility: Needs Assistance Bed Mobility: Sit to Supine       Sit to supine: Min assist   General bed mobility comments: Min A for readjusting once pt brought BLEs over EOB  Transfers Overall transfer level: Needs assistance Equipment used: Rolling walker (2 wheeled) Transfers: Sit to/from Stand Sit to Stand: Min assist;+2 physical assistance;+2 safety/equipment;From elevated surface Stand pivot transfers: Min assist;+2 safety/equipment       General transfer comment: Pt requiring Min A for gaining balance once in standing. Min A for safety during descent. ;    Balance Overall balance assessment: Needs  assistance Sitting-balance support: Bilateral upper extremity supported;Feet supported Sitting balance-Leahy Scale: Fair     Standing balance support: Bilateral upper extremity supported;During functional activity Standing balance-Leahy Scale: Poor Standing balance comment: Reliant on UE support                           ADL either performed or assessed with clinical judgement   ADL Overall ADL's : Needs assistance/impaired                         Toilet Transfer: Minimal assistance;+2 for physical assistance;+2 for safety/equipment;RW(simulated in room) Toilet Transfer Details (indicate cue type and reason): Min A for gaining balance once in standing         Functional mobility during ADLs: Minimal assistance;+2 for physical assistance;+2 for safety/equipment;Rolling walker General ADL Comments: Pt motivated to participate in therapy. Performing sit<>stand and then lateral steps at EOB three times. Requiring seated rest breaks inbetween     Vision       Perception     Praxis      Cognition Arousal/Alertness: Awake/alert Behavior During Therapy: WFL for tasks assessed/performed;Flat affect Overall Cognitive Status: Within Functional Limits for tasks assessed                                          Exercises     Shoulder Instructions       General Comments SpO2 91-88% on 40L with FiO2 at 65% via HFNC while  seated in the recliner. SpO2 flucutating between 86-79% during activity. During rest breaks at EOB, Spo2 slowly rising to 92%. HR elevated to 130 during activity and RR in 30s.    Pertinent Vitals/ Pain       Pain Assessment: Faces Faces Pain Scale: Hurts little more Pain Location: chest from coughing Pain Descriptors / Indicators: Discomfort Pain Intervention(s): Monitored during session;Repositioned  Home Living                                          Prior Functioning/Environment               Frequency  Min 3X/week        Progress Toward Goals  OT Goals(current goals can now be found in the care plan section)  Progress towards OT goals: Progressing toward goals  Acute Rehab OT Goals Patient Stated Goal: to feel better, home soon OT Goal Formulation: With patient Time For Goal Achievement: 11/18/18 Potential to Achieve Goals: Good ADL Goals Pt Will Perform Grooming: with min guard assist;standing Pt Will Perform Lower Body Dressing: with min guard assist;with adaptive equipment;sit to/from stand Pt Will Transfer to Toilet: with min guard assist;bedside commode;ambulating Pt Will Perform Toileting - Clothing Manipulation and hygiene: with min guard assist;sit to/from stand;sitting/lateral leans Pt Will Perform Tub/Shower Transfer: with min assist;shower seat;ambulating;Tub transfer  Plan Discharge plan remains appropriate    Co-evaluation    PT/OT/SLP Co-Evaluation/Treatment: Yes Reason for Co-Treatment: For patient/therapist safety;To address functional/ADL transfers   OT goals addressed during session: ADL's and self-care      AM-PAC OT "6 Clicks" Daily Activity     Outcome Measure   Help from another person eating meals?: None Help from another person taking care of personal grooming?: A Little Help from another person toileting, which includes using toliet, bedpan, or urinal?: A Lot Help from another person bathing (including washing, rinsing, drying)?: A Lot Help from another person to put on and taking off regular upper body clothing?: A Little Help from another person to put on and taking off regular lower body clothing?: A Lot 6 Click Score: 16    End of Session Equipment Utilized During Treatment: Gait belt;Rolling walker;Oxygen(40L with FiO2 65% via HFNC)  OT Visit Diagnosis: Unsteadiness on feet (R26.81);Other abnormalities of gait and mobility (R26.89);Muscle weakness (generalized) (M62.81)   Activity Tolerance Patient tolerated treatment  well   Patient Left in chair;with call bell/phone within reach;with chair alarm set   Nurse Communication Mobility status        Time: 1415-1447 OT Time Calculation (min): 32 min  Charges: OT General Charges $OT Visit: 1 Visit OT Treatments $Therapeutic Activity: 8-22 mins  [employer], OTR/L Acute Rehab Pager: [phone] Office: [place] 11/10/2018, 3:13 PM

## 2018-11-10 NOTE — Progress Notes (Signed)
PROGRESS NOTE  Andrew Larsen SLH:[id number] DOB: 1940/09/04 DOA: 11/03/2018  PCP: Velna Hatchet, MD  Brief History/Interval Summary: 78 y.o.BM PMHx  T2DM, PAD, HTN, OSA, and BPH who presented with dry cough and shortness of breath with positive covid exposures. He tested positive for covid and had bilateral CXR infiltrates, admitted to [facility] and developed hypoxia.  He was initially admitted to the progressive care unit.  However his respiratory status continued to get worse and he was transferred to the intensive care unit.  Reason for Visit: Acute respiratory disease due to COVID-19  Consultants: Pulmonology  Procedures: Central line placement into the left internal jugular 7/10  Antibiotics: Anti-infectives (From admission, onward)   Start     Dose/Rate Route Frequency Ordered Stop   11/08/18 1330  remdesivir 100 mg in sodium chloride 0.9 % 250 mL IVPB     100 mg 500 mL/hr over 30 Minutes Intravenous Every 24 hours 11/07/18 1229 11/12/18 1329   11/07/18 1330  remdesivir 200 mg in sodium chloride 0.9 % 250 mL IVPB     200 mg 500 mL/hr over 30 Minutes Intravenous Once 11/07/18 1229 11/08/18 1529       Subjective/Interval History: Patient states that he is feeling slightly better this morning.  Was able to sleep some overnight.  Cough is improved.  Denies any chest pain.  No nausea vomiting.       Assessment/Plan:  Acute Hypoxic Resp. Failure due to Acute Covid 19 Viral Illness      Component Value Date/Time   PHART 7.459 (H) 11/07/2018 1312   PCO2ART 30.5 (L) 11/07/2018 1312   PO2ART 46.0 (L) 11/07/2018 1312   HCO3 21.7 11/07/2018 1312   TCO2 23 11/07/2018 1312   ACIDBASEDEF 1.0 11/07/2018 1312   O2SAT 85.0 11/07/2018 1312    COVID-19 Labs  Recent Labs    11/08/18 0500 11/09/18 0449 11/10/18 0505  DDIMER  --  2.28* 2.32*  FERRITIN 675* 667* 599*  CRP 11.4* 6.0* 3.4*    Lab Results  Component Value Date   SARSCOV2NAA POSITIVE (A) 11/03/2018      Fever: No fever in the last 4 days.  T-max was 102.9 on 7/7. Oxygen requirements: Continues to be on heated HF [place].  40 L.  70% FiO2.  Saturating in the late 80s.    Antibiotics: No antibacterials Remdesivir: Day 4 Steroids: Solu-Medrol 40 mg every 8 hours.  Start tapering from tomorrow. Diuretics: Lasix continues to be on hold. Actemra: Given on 7/10 and 7/11 Convalescent Plasma: Given on 7/11  Vitamin C and Zinc: Continue DVT Prophylaxis: Heparin 7500 units subcu every 8 hours.   Patient is respiratory status is slightly better today though remains tenuous.  He is currently on 70% FiO2.  His CRP is improved to 3.4.  D-dimer 2.32.  Ferritin 599.  Continue Remdesivir steroids.  He is received 2 doses of Actemra and also received plasma.  Continue incentive spirometry.  Out of bed to the chair.  Prone positioning as much as tolerated.  WBC 14.3 most likely due to steroids.  The treatment plan and use of medications and known side effects were discussed with patient.  Some of the medications used are based on case reports/anecdotal data which are not peer-reviewed and has not been studied using randomized control trials.  Complete risks and long-term side effects are unknown, however in the best clinical judgment they seem to be of some benefit.  Patient agrees with the treatment plan and want to receive  these treatments as indicated.  Patient was given steroids and Actemra.  Abnormal chest x-ray An area of concern noted in the left lung.  Concern for pneumatocele.  Discussed with Dr. Lake Bells with pulmonology.  Plan is to repeat chest x-ray to further evaluate.  Mild transaminitis Secondary to COVID-19.  Stable.  Continue to monitor periodically.    Diabetes mellitus type 2 in the setting of obesity CBGs elevated due to steroids but stable for the most part.  Continue Lantus and SSI.  HbA1c 7.3.   Chronic kidney disease stage III Baseline creatinine around 1.5.  Creatinine is close to  baseline.  His diuretics are on hold as he was felt to be volume depleted at admission.  Monitor urine output.  He remains in negative fluid balance.  We will continue to assess for resumption of diuretics.  His weight has been stable.  Questionable history of congestive heart failure No echocardiogram reports available on epic.  Patient was on ARB and diuretics at home.  These could have been prescribed due to his CKD.  Will not do an echocardiogram at this time it is not clearly indicated.  Continue to monitor volume status.  This can be further pursued in the outpatient setting.  History of coronary artery disease Stable.  No chest pain.  Continue home medications.  Essential hypertension Elevated most likely due to steroids.  Hydralazine as needed.  Holding diuretic and his ARB.  Amlodipine was initiated yesterday.  Will adjust tomorrow if blood pressure not optimally controlled.    History of peripheral arterial disease Continue cilostazol.  History of obstructive sleep apnea Currently on high flow nasal cannula.  Cannot utilize CPAP at this time.  History of diabetic neuropathy Continue gabapentin.  Morbid obesity BMI 36.65.  Recently diagnosed inclusion body myositis That would explain the elevated CK level.  Apparently followed at [facility] by a neurologist, Dr. Warnell Forester ([phone]).  CK level had shown improvement.   DVT Prophylaxis: Subcutaneous heparin PUD Prophylaxis: Continue Pepcid Code Status: Full code Family Communication: Discussed with the patient.  Discussing with his daughter on a daily basis.   Disposition Plan: Remains tenuous.  Continue ICU monitoring.     Medications:  Scheduled:  sodium chloride   Intravenous Once   amLODipine  5 mg Oral Daily   aspirin  81 mg Oral QHS   brimonidine  1 drop Both Eyes BID   Chlorhexidine Gluconate Cloth  6 each Topical Daily   cilostazol  100 mg Oral BID   famotidine  20 mg Oral QHS   folic acid  1 mg  Oral Daily   gabapentin  600 mg Oral TID   heparin  7,500 Units Subcutaneous Q8H   insulin aspart  0-20 Units Subcutaneous Q4H   insulin glargine  40 Units Subcutaneous QHS   Ipratropium-Albuterol  2 puff Inhalation QID   latanoprost  1 drop Both Eyes QHS   mouth rinse  15 mL Mouth Rinse BID   methylPREDNISolone (SOLU-MEDROL) injection  40 mg Intravenous TID   oxybutynin  5 mg Oral BID   sodium chloride flush  3 mL Intravenous Q12H   tamsulosin  0.4 mg Oral Daily   thiamine  100 mg Oral Daily   timolol  1 drop Both Eyes BID   vitamin C  500 mg Oral Daily   zinc sulfate  220 mg Oral Daily   Continuous:  remdesivir 100 mg in NS 250 mL Stopped (11/09/18 1501)   KLK:JZPHXTAVWPVXY, benzonatate, chlorpheniramine-HYDROcodone, guaiFENesin-dextromethorphan, hydrALAZINE,  ondansetron **OR** ondansetron (ZOFRAN) IV, senna-docusate   Objective:  Vital Signs  Vitals:   11/10/18 0708 11/10/18 0745 11/10/18 0800 11/10/18 0900  BP: (!) 162/86  (!) 166/77 (!) 158/69  Pulse:   (!) 107 86  Resp: (!) 22  18   Temp: 99 F (37.2 C)  99 F (37.2 C)   TempSrc: Oral  Oral   SpO2:  90% (!) 88% (!) 88%  Weight:      Height:        Intake/Output Summary (Last 24 hours) at 11/10/2018 0913 Last data filed at 11/10/2018 0911 Gross per 24 hour  Intake 1139.11 ml  Output 975 ml  Net 164.11 ml   Filed Weights   11/08/18 0453 11/09/18 0500 11/10/18 0440  Weight: 119.2 kg 118.2 kg 119.8 kg    General appearance: Awake alert.  In no distress.  Morbidly obese Resp: Tachypneic at rest.  No use of accessory muscles.  Coarse breath sounds with crackles at the bases.  Not much wheezing heard today compared to yesterday.  No rhonchi.  Cardio: S1-S2 slightly tachycardic today.  Regular.  No S3-S4.  No rubs or bruit.  Telemetry shows sinus rhythm  GI: Abdomen is soft.  Nontender nondistended.  Bowel sounds are present normal.  No masses organomegaly Extremities: No significant edema.  Moving  all his extremities. Neurologic: Alert and oriented x3.  No focal neurological deficits.     Lab Results:  Data Reviewed: I have personally reviewed following labs and imaging studies  CBC: Recent Labs  Lab 11/05/18 0330 11/06/18 0330 11/07/18 0500 11/07/18 1254 11/07/18 1312 11/09/18 0449 11/10/18 0505  WBC 5.3 4.7 11.7*  --   --  12.4* 14.3*  NEUTROABS 2.9 3.8 10.1*  --   --  10.3* 11.2*  HGB 14.1 13.8 14.9 17.7* 15.6 13.9 14.7  HCT 41.9 41.7 43.9 52.0 46.0 42.1 43.9  MCV 92.3 90.5 91.1  --   --  89.6 91.3  PLT 151 177 198  --   --  278 948    Basic Metabolic Panel: Recent Labs  Lab 11/06/18 0330 11/07/18 0500 11/07/18 1254 11/07/18 1312 11/07/18 1335 11/08/18 0500 11/09/18 0449 11/10/18 0505  NA 131* 131* 128* 129*  --  134* 137 138  K 4.2 4.3 3.9 4.0  --  4.1 3.9 4.2  CL 94* 94*  --   --   --  98 100 104  CO2 23 22  --   --   --  25 26 23   GLUCOSE 285* 295*  --   --   --  164* 175* 203*  BUN 28* 39*  --   --   --  47* 42* 40*  CREATININE 1.56* 1.65*  --   --   --  1.62* 1.44* 1.40*  CALCIUM 8.2* 8.6*  --   --   --  8.5* 8.5* 8.4*  MG  --   --   --   --  2.1 2.3 2.5* 2.5*  PHOS  --   --   --   --   --  3.1 2.7 2.4*    GFR: Estimated Creatinine Clearance: 59.1 mL/min (A) (by C-G formula based on SCr of 1.4 mg/dL (H)).  Liver Function Tests: Recent Labs  Lab 11/06/18 0330 11/07/18 0500 11/08/18 0500 11/09/18 0449 11/10/18 0505  AST 75* 83* 78* 77* 54*  ALT 51* 69* 77* 91* 89*  ALKPHOS 73 71 68 68 70  BILITOT 0.6 0.3 0.5 0.4 0.4  PROT 6.9 7.4 6.9 6.5 6.5  ALBUMIN 3.3* 3.5 3.0* 3.0* 3.1*     Cardiac Enzymes: Recent Labs  Lab 11/08/18 0500 11/09/18 0449  CKTOTAL 777* 422*    HbA1C: Recent Labs    11/08/18 0500  HGBA1C 7.3*    CBG: Recent Labs  Lab 11/09/18 1210 11/09/18 1602 11/09/18 2000 11/09/18 2330 11/10/18 0736  GLUCAP 167* 233* 275* 273* 191*    Lipid Profile: Recent Labs    11/08/18 0500  CHOL 95  HDL 35*    LDLCALC 39  TRIG 104  CHOLHDL 2.7    Anemia Panel: Recent Labs    11/09/18 0449 11/10/18 0505  FERRITIN 667* 599*    Recent Results (from the past 240 hour(s))  Culture, blood (routine x 2)     Status: None   Collection Time: 11/03/18  2:29 PM   Specimen: BLOOD  Result Value Ref Range Status   Specimen Description   Final    BLOOD RIGHT ANTECUBITAL Performed at [facility], [place] [address], [place], [place] [address]    Special Requests   Final    BOTTLES DRAWN AEROBIC AND ANAEROBIC Blood Culture adequate volume Performed at [place] [address], [place], [place] [address]    Culture   Final    NO GROWTH 5 DAYS Performed at [facility], [place] [address], [place], [place] [address]    Report Status 11/08/2018 FINAL  Final  Culture, blood (routine x 2)     Status: None   Collection Time: 11/03/18  2:29 PM   Specimen: BLOOD RIGHT HAND  Result Value Ref Range Status   Specimen Description   Final    BLOOD RIGHT HAND Performed at [place] [address], [place], [place] [address]    Special Requests   Final    BOTTLES DRAWN AEROBIC AND ANAEROBIC Blood Culture adequate volume Performed at [place] [address], [place], [place] [address]    Culture   Final    NO GROWTH 5 DAYS Performed at [facility], [place] [address] [place], [place] [address]    Report Status 11/08/2018 FINAL  Final  SARS Coronavirus 2 (CEPHEID- Performed in [facility] hospital lab), Hosp Order     Status: Abnormal   Collection Time: 11/03/18  2:29 PM   Specimen: Nasopharyngeal Swab  Result Value Ref Range Status   SARS Coronavirus 2 POSITIVE (A) NEGATIVE Final    Comment: RESULT CALLED TO, READ BACK BY AND VERIFIED WITH: ZULETA,C AT 1600 ON 11/03/2018 BY JPM (NOTE) If result is NEGATIVE SARS-CoV-2 target nucleic acids are NOT DETECTED. The SARS-CoV-2 RNA is generally detectable in upper  and lower  respiratory specimens during the acute phase of infection. The lowest  concentration of SARS-CoV-2 viral copies this assay can detect is 250  copies / mL. A negative result does not preclude SARS-CoV-2 infection  and should not be used as the sole basis for treatment or other  patient management decisions.  A negative result may occur with  improper specimen collection / handling, submission of specimen other  than nasopharyngeal swab, presence of viral mutation(s) within the  areas targeted by this assay, and inadequate number of viral copies  (<250 copies / mL). A negative result must be combined with clinical  observations, patient history, and epidemiological information. If result is POSITIVE SARS-CoV-2 target nucleic acids are DETECTED. T he SARS-CoV-2 RNA is generally detectable in upper  and lower  respiratory specimens during the acute phase of infection.  Positive  results are indicative of active infection with SARS-CoV-2.  Clinical  correlation with patient history and other diagnostic information is  necessary to determine patient infection status.  Positive results do  not rule out bacterial infection or co-infection with other viruses. If result is PRESUMPTIVE POSTIVE SARS-CoV-2 nucleic acids MAY BE PRESENT.   A presumptive positive result was obtained on the submitted specimen  and confirmed on repeat testing.  While 2019 novel coronavirus  (SARS-CoV-2) nucleic acids may be present in the submitted sample  additional confirmatory testing may be necessary for epidemiological  and / or clinical management purposes  to differentiate between  SARS-CoV-2 and other Sarbecovirus currently known to infect humans.  If clinically indicated additional testing with an alternate test  methodology [phone]) is  advised. The SARS-CoV-2 RNA is generally  detectable in upper and lower respiratory specimens during the acute  phase of infection. The expected result is  Negative. Fact Sheet for Patients:  StrictlyIdeas.no Fact Sheet for Healthcare Providers: BankingDealers.co.za This test is not yet approved or cleared by the [place] FDA and has been authorized for detection and/or diagnosis of SARS-CoV-2 by FDA under an Emergency Use Authorization (EUA).  This EUA will remain in effect (meaning this test can be used) for the duration of the COVID-19 declaration under Section 564(b)(1) of the Act, 21 U.S.C. section 360bbb-3(b)(1), unless the authorization is terminated or revoked sooner. Performed at [facility], [place] [address], [place], [place] [address]       Radiology Studies: Dg Chest Port 1 View  Result Date: 11/10/2018 CLINICAL DATA:  77 year old male with COVID-19 pneumonia EXAM: PORTABLE CHEST 1 VIEW COMPARISON:  Prior chest x-ray 11/07/2018 FINDINGS: Stable position of left IJ central venous catheter. The catheter tip overlies the innominate vein. Improving appearance of multifocal patchy interstitial and airspace opacities bilaterally. There is a region of focal clearing in the left mid lung just superior to the cardiac margin which could represent a developing pneumatocele. No acute osseous abnormality. IMPRESSION: 1. Improving appearance of bilateral multifocal interstitial and airspace opacities compared to 11/07/2018. 2. Region of more focal and slightly rounded lucency in the left mid lung could represent a developing pneumatocele versus a region of more advanced clearing. 3. Stable position of left IJ central venous catheter. Electronically Signed   By: Jacqulynn Cadet M.D.   On: 11/10/2018 08:01       LOS: 5 days   [facility] Pager on [email or web address]  11/10/2018, 9:13 AM

## 2018-11-10 NOTE — Care Management Important Message (Signed)
Important Message  Patient Details  Name: EXAVIER LINA MRN: [id number] Date of Birth: 12/30/1940   Medicare Important Message Given:  Yes - Important Message mailed due to current National Emergency    Verbal consent obtained due to current National Emergency    Contact Name: [name] Call Date: 11/07/18  Time: 1651 Phone: [phone] Outcome: Spoke with contact Important Message mailed to: Patient address on file     Fredi Hurtado [address] 11/10/2018, 4:52 PM

## 2018-11-11 ENCOUNTER — Inpatient Hospital Stay (HOSPITAL_COMMUNITY): Payer: Medicare HMO

## 2018-11-11 DIAGNOSIS — J9601 Acute respiratory failure with hypoxia: Secondary | ICD-10-CM

## 2018-11-11 LAB — C-REACTIVE PROTEIN: CRP: 1.8 mg/dL — ABNORMAL HIGH (ref <1.0)

## 2018-11-11 LAB — COMPREHENSIVE METABOLIC PANEL
ALT: 88 U/L — ABNORMAL HIGH (ref 0–44)
AST: 55 U/L — ABNORMAL HIGH (ref 15–41)
Albumin: 3.2 g/dL — ABNORMAL LOW (ref 3.5–5.0)
Alkaline Phosphatase: 91 U/L (ref 38–126)
Anion gap: 10 (ref 5–15)
BUN: 36 mg/dL — ABNORMAL HIGH (ref 8–23)
CO2: 23 mmol/L (ref 22–32)
Calcium: 8.3 mg/dL — ABNORMAL LOW (ref 8.9–10.3)
Chloride: 106 mmol/L (ref 98–111)
Creatinine, Ser: 1.24 mg/dL (ref 0.61–1.24)
GFR calc Af Amer: >60 mL/min (ref >60)
GFR calc non Af Amer: 56 mL/min — ABNORMAL LOW (ref >60)
Glucose, Bld: 124 mg/dL — ABNORMAL HIGH (ref 70–99)
Potassium: 3.7 mmol/L (ref 3.5–5.1)
Sodium: 139 mmol/L (ref 135–145)
Total Bilirubin: 0.6 mg/dL (ref 0.3–1.2)
Total Protein: 6.5 g/dL (ref 6.5–8.1)

## 2018-11-11 LAB — GLUCOSE, CAPILLARY
Glucose-Capillary: 278 mg/dL — ABNORMAL HIGH (ref 70–99)
Glucose-Capillary: 143 mg/dL — ABNORMAL HIGH (ref 70–99)
Glucose-Capillary: 102 mg/dL — ABNORMAL HIGH (ref 70–99)
Glucose-Capillary: 202 mg/dL — ABNORMAL HIGH (ref 70–99)
Glucose-Capillary: 245 mg/dL — ABNORMAL HIGH (ref 70–99)
Glucose-Capillary: 335 mg/dL — ABNORMAL HIGH (ref 70–99)

## 2018-11-11 LAB — CBC WITH DIFFERENTIAL/PLATELET
Abs Immature Granulocytes: 0.8 10*3/uL — ABNORMAL HIGH (ref 0.00–0.07)
Basophils Absolute: 0.1 10*3/uL (ref 0.0–0.1)
Basophils Relative: 0 %
Eosinophils Absolute: 0 10*3/uL (ref 0.0–0.5)
Eosinophils Relative: 0 %
HCT: 46.4 % (ref 39.0–52.0)
Hemoglobin: 15.3 g/dL (ref 13.0–17.0)
Immature Granulocytes: 4 %
Lymphocytes Relative: 7 %
Lymphs Abs: 1.4 10*3/uL (ref 0.7–4.0)
MCH: 30.1 pg (ref 26.0–34.0)
MCHC: 33 g/dL (ref 30.0–36.0)
MCV: 91.2 fL (ref 80.0–100.0)
Monocytes Absolute: 1.7 10*3/uL — ABNORMAL HIGH (ref 0.1–1.0)
Monocytes Relative: 9 %
Neutro Abs: 15.5 10*3/uL — ABNORMAL HIGH (ref 1.7–7.7)
Neutrophils Relative %: 80 %
Platelets: 411 10*3/uL — ABNORMAL HIGH (ref 150–400)
RBC: 5.09 MIL/uL (ref 4.22–5.81)
RDW: 13.5 % (ref 11.5–15.5)
WBC: 19.5 10*3/uL — ABNORMAL HIGH (ref 4.0–10.5)
nRBC: 0 % (ref 0.0–0.2)

## 2018-11-11 LAB — D-DIMER, QUANTITATIVE: D-Dimer, Quant: 2.29 ug/mL-FEU — ABNORMAL HIGH (ref 0.00–0.50)

## 2018-11-11 LAB — FERRITIN: Ferritin: 612 ng/mL — ABNORMAL HIGH (ref 24–336)

## 2018-11-11 LAB — MAGNESIUM: Magnesium: 2.6 mg/dL — ABNORMAL HIGH (ref 1.7–2.4)

## 2018-11-11 MED ORDER — FUROSEMIDE 10 MG/ML IJ SOLN
40.0000 mg | Freq: Once | INTRAMUSCULAR | Status: AC
  Administered 2018-11-11: 12:00:00 40 mg via INTRAVENOUS
  Filled 2018-11-11: qty 4

## 2018-11-11 MED ORDER — METHYLPREDNISOLONE SODIUM SUCC 40 MG IJ SOLR
40.0000 mg | Freq: Every day | INTRAMUSCULAR | Status: DC
  Administered 2018-11-11 – 2018-11-24 (×14): 40 mg via INTRAVENOUS
  Filled 2018-11-11 (×14): qty 1

## 2018-11-11 MED ORDER — METHYLPREDNISOLONE SODIUM SUCC 40 MG IJ SOLR: 40.0000 mg | Freq: Two times a day (BID) | INTRAMUSCULAR | Status: DC

## 2018-11-11 MED ORDER — AMLODIPINE BESYLATE 10 MG PO TABS
10.0000 mg | ORAL_TABLET | Freq: Every day | ORAL | Status: DC
  Administered 2018-11-12 – 2018-11-14 (×3): 10 mg via ORAL
  Filled 2018-11-11: qty 1
  Filled 2018-11-11: qty 2
  Filled 2018-11-11 (×2): qty 1
  Filled 2018-11-11: qty 2

## 2018-11-11 MED ORDER — AMLODIPINE BESYLATE 5 MG PO TABS
5.0000 mg | ORAL_TABLET | Freq: Once | ORAL | Status: AC
  Administered 2018-11-11: 5 mg via ORAL
  Filled 2018-11-11: qty 1

## 2018-11-11 MED ORDER — POTASSIUM CHLORIDE CRYS ER 20 MEQ PO TBCR
40.0000 meq | EXTENDED_RELEASE_TABLET | Freq: Once | ORAL | Status: AC
  Administered 2018-11-11: 40 meq via ORAL
  Filled 2018-11-11: qty 2

## 2018-11-11 NOTE — Progress Notes (Signed)
Alert and oriented x 4. Afebrile. Transferred from chair to bed. Patient requested to sit on the side of the bed. Transition from standing to sitting tolerated fair. Dyspnea on exertion noted. At rest O2 SAT 88-94%. On HFNC FiO2 60% and 40 liters. Call bell within reach. Encouraged to report signs and symptoms to staff.

## 2018-11-11 NOTE — Procedures (Deleted)
Intubation Procedure Note Andrew Larsen [id number] September 29, 1940  Procedure: Intubation Indications: Respiratory insufficiency  Procedure Details Consent: Risks of procedure as well as the alternatives and risks of each were explained to the (patient/caregiver).  Consent for procedure obtained. Time Out: Verified patient identification, verified procedure, site/side was marked, verified correct patient position, special equipment/implants available, medications/allergies/relevent history reviewed, required imaging and test results available.  Performed  Drugs Versed 3m, Fentanyl 549m IV, Etomidate 2065mV, Rocuronium 100m6m DL x 1 with MAC 4 blade Grade 1 view 8.0 ET tube passed through cords under direct visualization Placement confirmed with bilateral breath sounds, positive EtCO2 change and smoke in tube   Evaluation Hemodynamic Status: BP stable throughout; O2 sats: stable throughout Patient's Current Condition: stable Complications: No apparent complications Patient did tolerate procedure well. Chest X-ray ordered to verify placement.  CXR: pending.   DougSimonne Maffucci4/2020

## 2018-11-11 NOTE — Progress Notes (Signed)
NAME:  Andrew Larsen, MRN:  [id number], DOB:  [birth year], LOS: 6 ADMISSION DATE:  11/03/2018, CONSULTATION DATE: July 10 REFERRING MD: Sherral Hammers, CHIEF COMPLAINT: Dyspnea  Brief History   78 year old male with multiple medical problems admitted on July 6 with COVID-19 pneumonia.  On July 10 he developed worsening severe acute respiratory failure with hypoxemia requiring transfer to the ICU for heated high flow oxygen.  Past Medical History  OSA PAD Hypertension Hyperlipidemia GERD DM2 CKD  Significant Hospital Events   7/6 admission 7/10 transfer to ICU  Consults:  PCCM  Procedures:  L IJ CVL 7/10 >>>  Significant Diagnostic Tests:    Micro Data:   7/6 SARS COV2 > positive 7/6 blood culture >  Antimicrobials/COVID Rx  7/10 remdesivir > 7.14 7/6 solumedrol > 7/10 Tocilizumab> 7/11 7/11 convalescent plasma   Interim history/subjective:   Rested comfortably overnight Still has some coughing spells Eating this morning, appetite seems to be back Set up in chair yesterday.   Objective   Blood pressure (!) 143/80, pulse (!) 116, temperature 98.2 F (36.8 C), temperature source Oral, resp. rate (!) 25, height 5\' 11"  (1.803 m), weight 119.8 kg, SpO2 (!) 82 %.    FiO2 (%):  [50 %-70 %] 70 %   Intake/Output Summary (Last 24 hours) at 11/11/2018 1606 Last data filed at 11/11/2018 1549 Gross per 24 hour  Intake 1263 ml  Output 2650 ml  Net -1387 ml   Filed Weights   11/08/18 0453 11/09/18 0500 11/10/18 0440  Weight: 119.2 kg 118.2 kg 119.8 kg    Examination:   General:  Resting comfortably in chair HENT: NCAT OP clear PULM: Distant, faint crackles bases B, normal effort CV: RRR, no mgr GI: BS+, soft, nontender MSK: normal bulk and tone Neuro: awake, alert, no distress, MAEW  July 14 chest x-ray images independently reviewed showing persistent airspace disease bilaterally but to my review appears to be slightly improved today compared to yesterday  Resolved  Hospital Problem list     Assessment & Plan:  ARDS due to COVID 19 pneumonia: improved oxygenation 7/13, improved image findings 7/14 Complete remdesivir today No further plans for actemra Adjust Solu-Medrol down to 40 mg daily, maintain this for total of 10 days Tolerate periods of hypoxemia, goal at rest is greater than 85% SaO2, with movement ideally above 75% Decision for intubation should be based on a change in mental status or physical evidence of ventilatory failure such as nasal flaring, accessory muscle use, paradoxical breathing Out of bed to chair as able Prone positioning while in bed  Chest X-ray image from 7/13 suggested a Pneumatocele: not seen on 7/14 film Continue chest x-ray PRN    Best practice:  Diet: regular diet Pain/Anxiety/Delirium protocol (if indicated): n/a VAP protocol (if indicated): n/a DVT prophylaxis: sub q heparin GI prophylaxis: famotidine Glucose control: SSI Mobility: bed rest Code Status: full Family Communication: per [facility] Disposition: remain in ICU  Labs   CBC: Recent Labs  Lab 11/06/18 0330 11/07/18 0500 11/07/18 1254 11/07/18 1312 11/09/18 0449 11/10/18 0505 11/11/18 0510  WBC 4.7 11.7*  --   --  12.4* 14.3* 19.5*  NEUTROABS 3.8 10.1*  --   --  10.3* 11.2* 15.5*  HGB 13.8 14.9 17.7* 15.6 13.9 14.7 15.3  HCT 41.7 43.9 52.0 46.0 42.1 43.9 46.4  MCV 90.5 91.1  --   --  89.6 91.3 91.2  PLT 177 198  --   --  278 326 411*  Basic Metabolic Panel: Recent Labs  Lab 11/07/18 0500  11/07/18 1312 11/07/18 1335 11/08/18 0500 11/09/18 0449 11/10/18 0505 11/11/18 0510  NA 131*   < > 129*  --  134* 137 138 139  K 4.3   < > 4.0  --  4.1 3.9 4.2 3.7  CL 94*  --   --   --  98 100 104 106  CO2 22  --   --   --  25 26 23 23   GLUCOSE 295*  --   --   --  164* 175* 203* 124*  BUN 39*  --   --   --  47* 42* 40* 36*  CREATININE 1.65*  --   --   --  1.62* 1.44* 1.40* 1.24  CALCIUM 8.6*  --   --   --  8.5* 8.5* 8.4* 8.3*  MG  --   --    --  2.1 2.3 2.5* 2.5* 2.6*  PHOS  --   --   --   --  3.1 2.7 2.4*  --    < > = values in this interval not displayed.   GFR: Estimated Creatinine Clearance: 66.7 mL/min (by C-G formula based on SCr of 1.24 mg/dL). Recent Labs  Lab 11/07/18 0500 11/09/18 0449 11/10/18 0505 11/11/18 0510  WBC 11.7* 12.4* 14.3* 19.5*    Liver Function Tests: Recent Labs  Lab 11/07/18 0500 11/08/18 0500 11/09/18 0449 11/10/18 0505 11/11/18 0510  AST 83* 78* 77* 54* 55*  ALT 69* 77* 91* 89* 88*  ALKPHOS 71 68 68 70 91  BILITOT 0.3 0.5 0.4 0.4 0.6  PROT 7.4 6.9 6.5 6.5 6.5  ALBUMIN 3.5 3.0* 3.0* 3.1* 3.2*   No results for input(s): LIPASE, AMYLASE in the last 168 hours. No results for input(s): AMMONIA in the last 168 hours.  ABG    Component Value Date/Time   PHART 7.459 (H) 11/07/2018 1312   PCO2ART 30.5 (L) 11/07/2018 1312   PO2ART 46.0 (L) 11/07/2018 1312   HCO3 21.7 11/07/2018 1312   TCO2 23 11/07/2018 1312   ACIDBASEDEF 1.0 11/07/2018 1312   O2SAT 85.0 11/07/2018 1312     Coagulation Profile: No results for input(s): INR, PROTIME in the last 168 hours.  Cardiac Enzymes: Recent Labs  Lab 11/08/18 0500 11/09/18 0449  CKTOTAL 777* 422*    HbA1C: Hgb A1c MFr Bld  Date/Time Value Ref Range Status  11/08/2018 05:00 AM 7.3 (H) 4.8 - 5.6 % Final    Comment:    (NOTE) Pre diabetes:          5.7%-6.4% Diabetes:              >6.4% Glycemic control for   <7.0% adults with diabetes   11/07/2018 05:00 AM 7.2 (H) 4.8 - 5.6 % Final    Comment:    (NOTE)         Prediabetes: 5.7 - 6.4         Diabetes: >6.4         Glycemic control for adults with diabetes: <7.0     CBG: Recent Labs  Lab 11/10/18 2032 11/10/18 2329 11/11/18 0425 11/11/18 0801 11/11/18 1130  GLUCAP 315* 278* 143* 102* 202*      Critical care time: 26 mintues     Roselie Awkward, MD [facility] Pager: [phone] Cell: [phone] If no response, call [phone]

## 2018-11-11 NOTE — Progress Notes (Signed)
PROGRESS NOTE  Andrew Larsen YNW:[id number] DOB: January 21, 1941 DOA: 11/03/2018  PCP: Velna Hatchet, MD  Brief History/Interval Summary: 79 y.o.BM PMHx  T2DM, PAD, HTN, OSA, and BPH who presented with dry cough and shortness of breath with positive covid exposures. He tested positive for covid and had bilateral CXR infiltrates, admitted to [facility] and developed hypoxia.  He was initially admitted to the progressive care unit.  However his respiratory status continued to get worse and he was transferred to the intensive care unit.  Reason for Visit: Acute respiratory disease due to COVID-19  Consultants: Pulmonology  Procedures: Central line placement into the left internal jugular 7/10  Antibiotics: Anti-infectives (From admission, onward)   Start     Dose/Rate Route Frequency Ordered Stop   11/08/18 1330  remdesivir 100 mg in sodium chloride 0.9 % 250 mL IVPB     100 mg 500 mL/hr over 30 Minutes Intravenous Every 24 hours 11/07/18 1229 11/12/18 1329   11/07/18 1330  remdesivir 200 mg in sodium chloride 0.9 % 250 mL IVPB     200 mg 500 mL/hr over 30 Minutes Intravenous Once 11/07/18 1229 11/08/18 1529       Subjective/Interval History: Patient states that he is about the same.  Did not get much rest during the night but denies any worsening in his shortness of breath.  Continues to have cough intermittently.  No nausea vomiting.      Assessment/Plan:  Acute Hypoxic Resp. Failure due to Acute Covid 19 Viral Illness   COVID-19 Labs  Recent Labs    11/09/18 0449 11/10/18 0505 11/11/18 0510  DDIMER 2.28* 2.32* 2.29*  FERRITIN 667* 599* 612*  CRP 6.0* 3.4* 1.8*    Lab Results  Component Value Date   SARSCOV2NAA POSITIVE (A) 11/03/2018     Fever: Afebrile.   Oxygen requirements: Continues to be on heated HFNC.  45 L.  70% FiO2.  Saturating in the late 80s to early 90s   Antibiotics: No antibacterials Remdesivir: Day 5 Steroids: Solu-Medrol 40 mg every 8 hours.  Start  tapering from tomorrow. Diuretics: 1 dose of Lasix to be given today Actemra: Given on 7/10 and 7/11 Convalescent Plasma: Given on 7/11  Vitamin C and Zinc: Continue DVT Prophylaxis: Heparin 7500 units subcu every 8 hours.   Respiratory status is stable though tenuous.  Currently on 70% FiO2.  CRP improved to 1.8.  Ferritin 612.  D-dimer 2.29.  Patient complete course of Remdesivir today.  We will start tapering steroids.  He will be given 1 dose of Lasix today as well as he does appear to have some volume overload.  He is received 2 doses of Actemra and has also received convalescent plasma.  Continue incentive spirometry.  Out of bed to chair.  WBC is 19.5 which is most likely due to steroids.  The treatment plan and use of medications and known side effects were discussed with patient.  Some of the medications used are based on case reports/anecdotal data which are not peer-reviewed and has not been studied using randomized control trials.  Complete risks and long-term side effects are unknown, however in the best clinical judgment they seem to be of some benefit.  Patient agrees with the treatment plan and want to receive these treatments as indicated.  Patient was given steroids and Actemra.  Abnormal chest x-ray An area of concern noted in the left lung on the x-ray done yesterday morning.  Concern for pneumatocele.  Discussed with Dr. Lake Bells with  pulmonology.  X-ray was repeated yesterday afternoon and this morning.  No such area of concern noted today.  Continue to monitor clinically.  No further evaluation at this time.    Mild transaminitis Secondary to COVID-19.  Stable.  Continue to monitor periodically.    Diabetes mellitus type 2 in the setting of obesity Hyperglycemia secondary to steroids.  Stable for the most part.  HbA1c 7.3.  Continue Lantus and SSI.    Chronic kidney disease stage III Baseline creatinine around 1.5.  Creatinine is close to baseline.  His diuretics were  initially kept on hold as he was thought to be volume depleted at admission.  Seems to be a little volume overloaded today.  We will give him a dose of Lasix.  Continue to monitor closely.  Continue to check weights.  Not a urine output.  Questionable history of congestive heart failure No echocardiogram reports available on epic.  Patient was on ARB and diuretics at home.  These could have been prescribed due to his CKD.  Will not do an echocardiogram at this time it is not clearly indicated.  Continue to monitor volume status.  This can be further pursued in the outpatient setting.  History of coronary artery disease Stable.  No chest pain.  Continue home medications.  Essential hypertension Blood pressure is somewhat on the higher side.  He is on hydralazine as needed.  His diuretic and ARB are on hold.  Amlodipine was initiated 2 days ago.  We will increase the dose today.     History of peripheral arterial disease Continue cilostazol.  History of obstructive sleep apnea Currently on high flow nasal cannula.  Cannot utilize CPAP at this time.  History of diabetic neuropathy Continue gabapentin.  Morbid obesity BMI 36.65.  Recently diagnosed inclusion body myositis That would explain the elevated CK level.  Apparently followed at [facility] by a neurologist, Dr. Warnell Forester ([phone]).  CK level had shown improvement.   DVT Prophylaxis: Subcutaneous heparin PUD Prophylaxis: Continue Pepcid Code Status: Full code Family Communication: Discussed with the patient.  Discussing with his daughter on a daily basis.   Disposition Plan: Remains tenuous.  Continue ICU monitoring.   Medications:  Scheduled: . sodium chloride   Intravenous Once  . amLODipine  5 mg Oral Daily  . aspirin  81 mg Oral QHS  . brimonidine  1 drop Both Eyes BID  . Chlorhexidine Gluconate Cloth  6 each Topical Daily  . cilostazol  100 mg Oral BID  . famotidine  20 mg Oral QHS  . folic acid  1 mg Oral Daily   . furosemide  40 mg Intravenous Once  . gabapentin  600 mg Oral TID  . heparin  7,500 Units Subcutaneous Q8H  . insulin aspart  0-20 Units Subcutaneous Q4H  . insulin glargine  40 Units Subcutaneous QHS  . Ipratropium-Albuterol  2 puff Inhalation QID  . latanoprost  1 drop Both Eyes QHS  . mouth rinse  15 mL Mouth Rinse BID  . methylPREDNISolone (SOLU-MEDROL) injection  40 mg Intravenous TID  . oxybutynin  5 mg Oral BID  . potassium chloride  40 mEq Oral Once  . sodium chloride flush  3 mL Intravenous Q12H  . tamsulosin  0.4 mg Oral Daily  . thiamine  100 mg Oral Daily  . timolol  1 drop Both Eyes BID  . vitamin C  500 mg Oral Daily  . zinc sulfate  220 mg Oral Daily   Continuous: .  remdesivir 100 mg in NS 250 mL Stopped (11/10/18 1423)   VOZ:DGUYQIHKVQQVZ, benzonatate, chlorpheniramine-HYDROcodone, guaiFENesin-dextromethorphan, hydrALAZINE, ondansetron **OR** ondansetron (ZOFRAN) IV, senna-docusate   Objective:  Vital Signs  Vitals:   11/11/18 0400 11/11/18 0600 11/11/18 0800 11/11/18 1034  BP: (!) 160/89 (!) 147/80 (!) 155/88 (!) 149/88  Pulse: 81 99 (!) 117   Resp: 19 (!) 21 (!) 24   Temp: 98.2 F (36.8 C)  98.7 F (37.1 C)   TempSrc: Oral  Oral   SpO2: 96% 94% 93%   Weight:      Height:        Intake/Output Summary (Last 24 hours) at 11/11/2018 1042 Last data filed at 11/11/2018 1037 Gross per 24 hour  Intake 1113 ml  Output 1450 ml  Net -337 ml   Filed Weights   11/08/18 0453 11/09/18 0500 11/10/18 0440  Weight: 119.2 kg 118.2 kg 119.8 kg    General appearance: Awake alert.  In no distress.  Morbidly obese Resp: Mildly tachypneic at rest.  No use of accessory muscles.  Continues to have coarse breath sounds bilaterally with few crackles at the bases.  No wheezing heard today.  No rhonchi.   Cardio: S1-S2 mildly tachycardic.  Regular.  No S3-S4.  No rubs or bruit.  Telemetry shows mild sinus tachycardia. GI: Abdomen is soft.  Nontender nondistended.  Bowel  sounds are present normal.  No masses organomegaly Extremities: No edema.  Full range of motion of lower extremities. Neurologic: Alert and oriented x3.  No focal neurological deficits.     Lab Results:  Data Reviewed: I have personally reviewed following labs and imaging studies  CBC: Recent Labs  Lab 11/06/18 0330 11/07/18 0500 11/07/18 1254 11/07/18 1312 11/09/18 0449 11/10/18 0505 11/11/18 0510  WBC 4.7 11.7*  --   --  12.4* 14.3* 19.5*  NEUTROABS 3.8 10.1*  --   --  10.3* 11.2* 15.5*  HGB 13.8 14.9 17.7* 15.6 13.9 14.7 15.3  HCT 41.7 43.9 52.0 46.0 42.1 43.9 46.4  MCV 90.5 91.1  --   --  89.6 91.3 91.2  PLT 177 198  --   --  278 326 411*    Basic Metabolic Panel: Recent Labs  Lab 11/07/18 0500  11/07/18 1312 11/07/18 1335 11/08/18 0500 11/09/18 0449 11/10/18 0505 11/11/18 0510  NA 131*   < > 129*  --  134* 137 138 139  K 4.3   < > 4.0  --  4.1 3.9 4.2 3.7  CL 94*  --   --   --  98 100 104 106  CO2 22  --   --   --  25 26 23 23   GLUCOSE 295*  --   --   --  164* 175* 203* 124*  BUN 39*  --   --   --  47* 42* 40* 36*  CREATININE 1.65*  --   --   --  1.62* 1.44* 1.40* 1.24  CALCIUM 8.6*  --   --   --  8.5* 8.5* 8.4* 8.3*  MG  --   --   --  2.1 2.3 2.5* 2.5* 2.6*  PHOS  --   --   --   --  3.1 2.7 2.4*  --    < > = values in this interval not displayed.    GFR: Estimated Creatinine Clearance: 66.7 mL/min (by C-G formula based on SCr of 1.24 mg/dL).  Liver Function Tests: Recent Labs  Lab 11/07/18 0500 11/08/18 0500 11/09/18  0449 11/10/18 0505 11/11/18 0510  AST 83* 78* 77* 54* 55*  ALT 69* 77* 91* 89* 88*  ALKPHOS 71 68 68 70 91  BILITOT 0.3 0.5 0.4 0.4 0.6  PROT 7.4 6.9 6.5 6.5 6.5  ALBUMIN 3.5 3.0* 3.0* 3.1* 3.2*     Cardiac Enzymes: Recent Labs  Lab 11/08/18 0500 11/09/18 0449  CKTOTAL 777* 422*    CBG: Recent Labs  Lab 11/10/18 1123 11/10/18 1554 11/10/18 2032 11/11/18 0425 11/11/18 0801  GLUCAP 188* 296* 315* 143* 102*     Anemia Panel: Recent Labs    11/10/18 0505 11/11/18 0510  FERRITIN 599* 612*    Recent Results (from the past 240 hour(s))  Culture, blood (routine x 2)     Status: None   Collection Time: 11/03/18  2:29 PM   Specimen: BLOOD  Result Value Ref Range Status   Specimen Description   Final    BLOOD RIGHT ANTECUBITAL Performed at [facility], [place] [address], [place], [place] [address]    Special Requests   Final    BOTTLES DRAWN AEROBIC AND ANAEROBIC Blood Culture adequate volume Performed at [place] [address] [place], [place] [address]    Culture   Final    NO GROWTH 5 DAYS Performed at [facility], [place] [address], [place], [place] [address]    Report Status 11/08/2018 FINAL  Final  Culture, blood (routine x 2)     Status: None   Collection Time: 11/03/18  2:29 PM   Specimen: BLOOD RIGHT HAND  Result Value Ref Range Status   Specimen Description   Final    BLOOD RIGHT HAND Performed at [facility] [address], [place], [place] [address]    Special Requests   Final    BOTTLES DRAWN AEROBIC AND ANAEROBIC Blood Culture adequate volume Performed at [place] [address] [place], [place] [address]    Culture   Final    NO GROWTH 5 DAYS Performed at [facility], [place] [address] [place], [place] [address]    Report Status 11/08/2018 FINAL  Final  SARS Coronavirus 2 (CEPHEID- Performed in [place] hospital lab), Hosp Order     Status: Abnormal   Collection Time: 11/03/18  2:29 PM   Specimen: Nasopharyngeal Swab  Result Value Ref Range Status   SARS Coronavirus 2 POSITIVE (A) NEGATIVE Final    Comment: RESULT CALLED TO, READ BACK BY AND VERIFIED WITH: ZULETA,C AT 1600 ON 11/03/2018 BY JPM (NOTE) If result is NEGATIVE SARS-CoV-2 target nucleic acids are NOT DETECTED. The SARS-CoV-2 RNA is generally detectable in upper and lower  respiratory specimens during the  acute phase of infection. The lowest  concentration of SARS-CoV-2 viral copies this assay can detect is 250  copies / mL. A negative result does not preclude SARS-CoV-2 infection  and should not be used as the sole basis for treatment or other  patient management decisions.  A negative result may occur with  improper specimen collection / handling, submission of specimen other  than nasopharyngeal swab, presence of viral mutation(s) within the  areas targeted by this assay, and inadequate number of viral copies  (<250 copies / mL). A negative result must be combined with clinical  observations, patient history, and epidemiological information. If result is POSITIVE SARS-CoV-2 target nucleic acids are DETECTED. T he SARS-CoV-2 RNA is generally detectable in upper and lower  respiratory specimens during the acute  phase of infection.  Positive  results are indicative of active infection with SARS-CoV-2.  Clinical  correlation with patient history and other diagnostic information is  necessary to determine patient infection status.  Positive results do  not rule out bacterial infection or co-infection with other viruses. If result is PRESUMPTIVE POSTIVE SARS-CoV-2 nucleic acids MAY BE PRESENT.   A presumptive positive result was obtained on the submitted specimen  and confirmed on repeat testing.  While 2019 novel coronavirus  (SARS-CoV-2) nucleic acids may be present in the submitted sample  additional confirmatory testing may be necessary for epidemiological  and / or clinical management purposes  to differentiate between  SARS-CoV-2 and other Sarbecovirus currently known to infect humans.  If clinically indicated additional testing with an alternate test  methodology [phone]) is  advised. The SARS-CoV-2 RNA is generally  detectable in upper and lower respiratory specimens during the acute  phase of infection. The expected result is Negative. Fact Sheet for Patients:   StrictlyIdeas.no Fact Sheet for Healthcare Providers: BankingDealers.co.za This test is not yet approved or cleared by the [place] FDA and has been authorized for detection and/or diagnosis of SARS-CoV-2 by FDA under an Emergency Use Authorization (EUA).  This EUA will remain in effect (meaning this test can be used) for the duration of the COVID-19 declaration under Section 564(b)(1) of the Act, 21 U.S.C. section 360bbb-3(b)(1), unless the authorization is terminated or revoked sooner. Performed at [facility], [place] [address], [place], [place] [address]       Radiology Studies: Dg Chest Port 1 View  Result Date: 11/11/2018 CLINICAL DATA:  Acute respiratory failure due to hypoxemia. EXAM: PORTABLE CHEST 1 VIEW COMPARISON:  Radiograph November 10, 2018. FINDINGS: Stable cardiomediastinal silhouette. Left internal jugular catheter is unchanged in position. No pneumothorax or significant pleural effusion is noted. Stable bilateral lung opacities are noted concerning for edema or pneumonia. Bony thorax is unremarkable. IMPRESSION: Stable bilateral lung opacities as described above. Electronically Signed   By: Marijo Conception M.D.   On: 11/11/2018 07:34   Dg Chest Port 1 View  Result Date: 11/10/2018 CLINICAL DATA:  Pneumatocele of lung EXAM: PORTABLE CHEST 1 VIEW COMPARISON:  Radiograph 11/10/2018 FINDINGS: Central venous line with tip in the distal brachiocephalic vein unchanged. Stable cardiac silhouette. Fine multifocal airspace disease is slightly increased in density compared to prior as noted over the RIGHT hemidiaphragm and LEFT upper lobe. Low lung volumes. IMPRESSION: Mild increase in multifocal fine airspace disease. Electronically Signed   By: Suzy Bouchard M.D.   On: 11/10/2018 10:32   Dg Chest Port 1 View  Result Date: 11/10/2018 CLINICAL DATA:  78 year old male with COVID-19 pneumonia EXAM: PORTABLE CHEST 1  VIEW COMPARISON:  Prior chest x-ray 11/07/2018 FINDINGS: Stable position of left IJ central venous catheter. The catheter tip overlies the innominate vein. Improving appearance of multifocal patchy interstitial and airspace opacities bilaterally. There is a region of focal clearing in the left mid lung just superior to the cardiac margin which could represent a developing pneumatocele. No acute osseous abnormality. IMPRESSION: 1. Improving appearance of bilateral multifocal interstitial and airspace opacities compared to 11/07/2018. 2. Region of more focal and slightly rounded lucency in the left mid lung could represent a developing pneumatocele versus a region of more advanced clearing. 3. Stable position of left IJ central venous catheter. Electronically Signed   By: Jacqulynn Cadet M.D.   On: 11/10/2018 08:01       LOS: 6 days  Daril Warga [employer]  [employer] operational officer on [email or web address]  11/11/2018, 10:42 AM

## 2018-11-11 NOTE — Progress Notes (Deleted)
NAME:  Andrew Larsen, MRN:  [id number], DOB:  12-09-1940, LOS: 6 ADMISSION DATE:  11/03/2018, CONSULTATION DATE: July 10 REFERRING MD: Sherral Hammers, CHIEF COMPLAINT: Dyspnea  Brief History   78 year old male with multiple medical problems admitted on July 6 with COVID-19 pneumonia.  On July 10 he developed worsening severe acute respiratory failure with hypoxemia requiring transfer to the ICU for heated high flow oxygen.  Past Medical History  OSA PAD Hypertension Hyperlipidemia GERD DM2 CKD  Significant Hospital Events   7/6 admission 7/10 transfer to ICU  Consults:  PCCM  Procedures:  L IJ CVL 7/10 >>>  Significant Diagnostic Tests:    Micro Data:   7/6 SARS COV2 > positive 7/6 blood culture >  Antimicrobials/COVID Rx  7/10 remdesivir > 7.14 7/6 solumedrol > 7/10 Tocilizumab> 7/11 7/11 convalescent plasma   Interim history/subjective:   Rested comfortably overnight Still has some coughing spells Eating this morning, appetite seems to be back Set up in chair yesterday.   Objective   Blood pressure (!) 147/80, pulse 99, temperature 98.2 F (36.8 C), temperature source Oral, resp. rate (!) 21, height 5\' 11"  (1.803 m), weight 119.8 kg, SpO2 94 %.    FiO2 (%):  [50 %-70 %] 50 %   Intake/Output Summary (Last 24 hours) at 11/11/2018 0743 Last data filed at 11/11/2018 0600 Gross per 24 hour  Intake 1103 ml  Output 1450 ml  Net -347 ml   Filed Weights   11/08/18 0453 11/09/18 0500 11/10/18 0440  Weight: 119.2 kg 118.2 kg 119.8 kg    Examination:   General:  Resting comfortably in chair HENT: NCAT OP clear PULM: Distant, faint crackles bases B, normal effort CV: RRR, no mgr GI: BS+, soft, nontender MSK: normal bulk and tone Neuro: awake, alert, no distress, MAEW  July 14 chest x-ray images independently reviewed showing persistent airspace disease bilaterally but to my review appears to be slightly improved today compared to yesterday  Resolved Hospital  Problem list     Assessment & Plan:  ARDS due to COVID 19 pneumonia: improved oxygenation 7/13, improved image findings 7/14 Complete remdesivir today No further plans for actemra Adjust Solu-Medrol down to 40 mg daily, maintain this for total of 10 days Tolerate periods of hypoxemia, goal at rest is greater than 85% SaO2, with movement ideally above 75% Decision for intubation should be based on a change in mental status or physical evidence of ventilatory failure such as nasal flaring, accessory muscle use, paradoxical breathing Out of bed to chair as able Prone positioning while in bed  Chest X-ray image from 7/13 suggested a Pneumatocele: not seen on 7/14 film Continue chest x-ray PRN    Best practice:  Diet: regular diet Pain/Anxiety/Delirium protocol (if indicated): n/a VAP protocol (if indicated): n/a DVT prophylaxis: sub q heparin GI prophylaxis: famotidine Glucose control: SSI Mobility: bed rest Code Status: full Family Communication: per [facility] Disposition: remain in ICU  Labs   CBC: Recent Labs  Lab 11/05/18 0330 11/06/18 0330 11/07/18 0500 11/07/18 1254 11/07/18 1312 11/09/18 0449 11/10/18 0505  WBC 5.3 4.7 11.7*  --   --  12.4* 14.3*  NEUTROABS 2.9 3.8 10.1*  --   --  10.3* 11.2*  HGB 14.1 13.8 14.9 17.7* 15.6 13.9 14.7  HCT 41.9 41.7 43.9 52.0 46.0 42.1 43.9  MCV 92.3 90.5 91.1  --   --  89.6 91.3  PLT 151 177 198  --   --  278 782    Basic Metabolic  Panel: Recent Labs  Lab 11/07/18 0500  11/07/18 1312 11/07/18 1335 11/08/18 0500 11/09/18 0449 11/10/18 0505 11/11/18 0510  NA 131*   < > 129*  --  134* 137 138 139  K 4.3   < > 4.0  --  4.1 3.9 4.2 3.7  CL 94*  --   --   --  98 100 104 106  CO2 22  --   --   --  25 26 23 23   GLUCOSE 295*  --   --   --  164* 175* 203* 124*  BUN 39*  --   --   --  47* 42* 40* 36*  CREATININE 1.65*  --   --   --  1.62* 1.44* 1.40* 1.24  CALCIUM 8.6*  --   --   --  8.5* 8.5* 8.4* 8.3*  MG  --   --   --  2.1 2.3  2.5* 2.5* 2.6*  PHOS  --   --   --   --  3.1 2.7 2.4*  --    < > = values in this interval not displayed.   GFR: Estimated Creatinine Clearance: 66.7 mL/min (by C-G formula based on SCr of 1.24 mg/dL). Recent Labs  Lab 11/06/18 0330 11/07/18 0500 11/09/18 0449 11/10/18 0505  WBC 4.7 11.7* 12.4* 14.3*    Liver Function Tests: Recent Labs  Lab 11/07/18 0500 11/08/18 0500 11/09/18 0449 11/10/18 0505 11/11/18 0510  AST 83* 78* 77* 54* 55*  ALT 69* 77* 91* 89* 88*  ALKPHOS 71 68 68 70 91  BILITOT 0.3 0.5 0.4 0.4 0.6  PROT 7.4 6.9 6.5 6.5 6.5  ALBUMIN 3.5 3.0* 3.0* 3.1* 3.2*   No results for input(s): LIPASE, AMYLASE in the last 168 hours. No results for input(s): AMMONIA in the last 168 hours.  ABG    Component Value Date/Time   PHART 7.459 (H) 11/07/2018 1312   PCO2ART 30.5 (L) 11/07/2018 1312   PO2ART 46.0 (L) 11/07/2018 1312   HCO3 21.7 11/07/2018 1312   TCO2 23 11/07/2018 1312   ACIDBASEDEF 1.0 11/07/2018 1312   O2SAT 85.0 11/07/2018 1312     Coagulation Profile: No results for input(s): INR, PROTIME in the last 168 hours.  Cardiac Enzymes: Recent Labs  Lab 11/08/18 0500 11/09/18 0449  CKTOTAL 777* 422*    HbA1C: Hgb A1c MFr Bld  Date/Time Value Ref Range Status  11/08/2018 05:00 AM 7.3 (H) 4.8 - 5.6 % Final    Comment:    (NOTE) Pre diabetes:          5.7%-6.4% Diabetes:              >6.4% Glycemic control for   <7.0% adults with diabetes   11/07/2018 05:00 AM 7.2 (H) 4.8 - 5.6 % Final    Comment:    (NOTE)         Prediabetes: 5.7 - 6.4         Diabetes: >6.4         Glycemic control for adults with diabetes: <7.0     CBG: Recent Labs  Lab 11/10/18 0736 11/10/18 1123 11/10/18 1554 11/10/18 2032 11/11/18 0425  GLUCAP 191* 188* 296* 315* 143*      Critical care time: 59 mintues     Roselie Awkward, MD [place] P[facility]M Pager: [phone] Cell: [phone] If no response, call [phone]

## 2018-11-12 LAB — COMPREHENSIVE METABOLIC PANEL
ALT: 111 U/L — ABNORMAL HIGH (ref 0–44)
AST: 64 U/L — ABNORMAL HIGH (ref 15–41)
Albumin: 3.1 g/dL — ABNORMAL LOW (ref 3.5–5.0)
Alkaline Phosphatase: 93 U/L (ref 38–126)
Anion gap: 10 (ref 5–15)
BUN: 43 mg/dL — ABNORMAL HIGH (ref 8–23)
CO2: 22 mmol/L (ref 22–32)
Calcium: 8.5 mg/dL — ABNORMAL LOW (ref 8.9–10.3)
Chloride: 105 mmol/L (ref 98–111)
Creatinine, Ser: 1.51 mg/dL — ABNORMAL HIGH (ref 0.61–1.24)
GFR calc Af Amer: 51 mL/min — ABNORMAL LOW (ref >60)
GFR calc non Af Amer: 44 mL/min — ABNORMAL LOW (ref >60)
Glucose, Bld: 222 mg/dL — ABNORMAL HIGH (ref 70–99)
Potassium: 4.6 mmol/L (ref 3.5–5.1)
Sodium: 137 mmol/L (ref 135–145)
Total Bilirubin: 0.3 mg/dL (ref 0.3–1.2)
Total Protein: 6.4 g/dL — ABNORMAL LOW (ref 6.5–8.1)

## 2018-11-12 LAB — CBC WITH DIFFERENTIAL/PLATELET
Abs Immature Granulocytes: 0.95 10*3/uL — ABNORMAL HIGH (ref 0.00–0.07)
Basophils Absolute: 0.1 10*3/uL (ref 0.0–0.1)
Basophils Relative: 0 %
Eosinophils Absolute: 0 10*3/uL (ref 0.0–0.5)
Eosinophils Relative: 0 %
HCT: 47 % (ref 39.0–52.0)
Hemoglobin: 15.9 g/dL (ref 13.0–17.0)
Immature Granulocytes: 5 %
Lymphocytes Relative: 7 %
Lymphs Abs: 1.3 10*3/uL (ref 0.7–4.0)
MCH: 30.5 pg (ref 26.0–34.0)
MCHC: 33.8 g/dL (ref 30.0–36.0)
MCV: 90.2 fL (ref 80.0–100.0)
Monocytes Absolute: 2.1 10*3/uL — ABNORMAL HIGH (ref 0.1–1.0)
Monocytes Relative: 11 %
Neutro Abs: 15.1 10*3/uL — ABNORMAL HIGH (ref 1.7–7.7)
Neutrophils Relative %: 77 %
Platelets: 377 10*3/uL (ref 150–400)
RBC: 5.21 MIL/uL (ref 4.22–5.81)
RDW: 13.6 % (ref 11.5–15.5)
WBC: 19.5 10*3/uL — ABNORMAL HIGH (ref 4.0–10.5)
nRBC: 0 % (ref 0.0–0.2)

## 2018-11-12 LAB — D-DIMER, QUANTITATIVE: D-Dimer, Quant: 2.15 ug/mL-FEU — ABNORMAL HIGH (ref 0.00–0.50)

## 2018-11-12 LAB — GLUCOSE, CAPILLARY
Glucose-Capillary: 148 mg/dL — ABNORMAL HIGH (ref 70–99)
Glucose-Capillary: 184 mg/dL — ABNORMAL HIGH (ref 70–99)
Glucose-Capillary: 222 mg/dL — ABNORMAL HIGH (ref 70–99)
Glucose-Capillary: 328 mg/dL — ABNORMAL HIGH (ref 70–99)
Glucose-Capillary: 356 mg/dL — ABNORMAL HIGH (ref 70–99)
Glucose-Capillary: 82 mg/dL (ref 70–99)

## 2018-11-12 LAB — FERRITIN: Ferritin: 600 ng/mL — ABNORMAL HIGH (ref 24–336)

## 2018-11-12 LAB — C-REACTIVE PROTEIN: CRP: 1.1 mg/dL — ABNORMAL HIGH (ref <1.0)

## 2018-11-12 LAB — MAGNESIUM: Magnesium: 2.5 mg/dL — ABNORMAL HIGH (ref 1.7–2.4)

## 2018-11-12 MED ORDER — ENOXAPARIN SODIUM 60 MG/0.6ML ~~LOC~~ SOLN
60.0000 mg | Freq: Two times a day (BID) | SUBCUTANEOUS | Status: DC
  Administered 2018-11-12 – 2018-11-19 (×14): 60 mg via SUBCUTANEOUS
  Filled 2018-11-12 (×13): qty 0.6

## 2018-11-12 MED ORDER — BISACODYL 5 MG PO TBEC
10.0000 mg | DELAYED_RELEASE_TABLET | Freq: Once | ORAL | Status: AC
  Administered 2018-11-12: 10 mg via ORAL
  Filled 2018-11-12: qty 2

## 2018-11-12 MED ORDER — POLYETHYLENE GLYCOL 3350 17 G PO PACK: 17.0000 g | PACK | Freq: Every day | ORAL | Status: DC

## 2018-11-12 MED ORDER — POLYETHYLENE GLYCOL 3350 17 G PO PACK
17.0000 g | PACK | Freq: Two times a day (BID) | ORAL | Status: AC
  Administered 2018-11-12 – 2018-11-14 (×3): 17 g via ORAL
  Filled 2018-11-12 (×5): qty 1

## 2018-11-12 MED ORDER — PANTOPRAZOLE SODIUM 40 MG PO TBEC
40.0000 mg | DELAYED_RELEASE_TABLET | Freq: Every day | ORAL | Status: DC
  Administered 2018-11-12: 40 mg via ORAL
  Filled 2018-11-12: qty 1

## 2018-11-12 NOTE — Progress Notes (Addendum)
Inpatient Diabetes Program Recommendations  AACE/ADA: New Consensus Statement on Inpatient Glycemic Control (2015)  Target Ranges:  Prepandial:   less than 140 mg/dL      Peak postprandial:   less than 180 mg/dL (1-2 hours)      Critically ill patients:  140 - 180 mg/dL   Lab Results  Component Value Date   GLUCAP 184 (H) 11/12/2018   HGBA1C 7.3 (H) 11/08/2018    Review of Glycemic Control  Diabetes history: DM2 Outpatient Diabetes medications: Novolin 70/30 insulin 50 units in am + 30 units pm ac meals Current orders for Inpatient glycemic control: Lantus 40 units + Novolog resistant correction q 4 hrs.  Inpatient Diabetes Program Recommendations:    Patient received total Novolog correction insulin 36 units over the past As steroids decreased, -Decrease Novolog correction to tid + hs 0-5 units -Transition to home 70/30 insulin bid (Will recheck in am of insulin needs)  Consider Novolog 70/30 insulin to start in am  25 units 70/30 bid = 35 basal insulin + 15 units meal coverage  Thank you, Bethena Roys E. Avice Funchess, RN, MSN, CDE  Diabetes Coordinator Inpatient Glycemic Control Team Team Pager [phone] (8am-5pm) 11/12/2018 2:41 PM

## 2018-11-12 NOTE — Progress Notes (Signed)
NAME:  Andrew Larsen, MRN:  [id number], DOB:  1940/11/05, LOS: 7 ADMISSION DATE:  11/03/2018, CONSULTATION DATE: July 10 REFERRING MD: Sherral Hammers, CHIEF COMPLAINT: Dyspnea  Brief History   78 year old male with multiple medical problems admitted on July 6 with COVID-19 pneumonia.  On July 10 he developed worsening severe acute respiratory failure with hypoxemia requiring transfer to the ICU for heated high flow oxygen.  Past Medical History  OSA PAD Hypertension Hyperlipidemia GERD DM2 CKD  Significant Hospital Events   7/6 admission 7/10 transfer to ICU  7/14 -  Rested comfortably overnight Still has some coughing spells Eating this morning, appetite seems to be back Set up in chair yesterday.   Consults:  PCCM  Procedures:  L IJ CVL 7/10 >>>  Significant Diagnostic Tests:    Micro Data:   7/6 SARS COV2 > positive 7/6 blood culture >  Antimicrobials/COVID Rx  7/10 remdesivir > 7.14 7/6 solumedrol > 7/10 Tocilizumab> 7/11 7/11 convalescent plasma   Interim history/subjective:    7/15 - 60% HFNC, 35L. Sitting. Coughs frequently. Able to converse  Objective   Blood pressure 128/68, pulse (!) 116, temperature 97.7 F (36.5 C), temperature source Oral, resp. rate 18, height 5\' 11"  (1.803 m), weight 118.6 kg, SpO2 (!) 86 %.    FiO2 (%):  [60 %-70 %] 60 %   Intake/Output Summary (Last 24 hours) at 11/12/2018 1425 Last data filed at 11/12/2018 1200 Gross per 24 hour  Intake 840 ml  Output 2125 ml  Net -1285 ml   Filed Weights   11/09/18 0500 11/10/18 0440 11/12/18 0500  Weight: 118.2 kg 119.8 kg 118.6 kg    Examination:   General Appearance:  Obese Head:  Normocephalic, without obvious abnormality, atraumatic Eyes:  PERRL - yes, conjunctiva/corneas - muddy     Ears:  Normal external ear canals, both ears Nose:  G tube - no but HFNC + Throat:  ETT TUBE - n , OG tube - no Neck:  Supple,  No enlargement/tenderness/nodules Lungs: No major distress  Heart:  CVP - no.  Pressors - no Abdomen:  Soft, no masses, no organomegaly Genitalia / Rectal:  Not done Extremities:  Extremities- intact Skin:  ntact in exposed areas  Neurologic:  Sedation - none -> RASS - +1 . Moves all 4s - yes. CAM-ICU - neg . Orientation - x3+    LABS    PULMONARY Recent Labs  Lab 11/07/18 1254 11/07/18 1312  PHART 7.431 7.459*  PCO2ART 35.0 30.5*  PO2ART 31.0* 46.0*  HCO3 23.3 21.7  TCO2 24 23  O2SAT 63.0 85.0    CBC Recent Labs  Lab 11/10/18 0505 11/11/18 0510 11/12/18 0105  HGB 14.7 15.3 15.9  HCT 43.9 46.4 47.0  WBC 14.3* 19.5* 19.5*  PLT 326 411* 377    COAGULATION No results for input(s): INR in the last 168 hours.  CARDIAC  No results for input(s): TROPONINI in the last 168 hours. No results for input(s): PROBNP in the last 168 hours.   CHEMISTRY Recent Labs  Lab 11/08/18 0500 11/09/18 0449 11/10/18 0505 11/11/18 0510 11/12/18 0105  NA 134* 137 138 139 137  K 4.1 3.9 4.2 3.7 4.6  CL 98 100 104 106 105  CO2 25 26 23 23 22   GLUCOSE 164* 175* 203* 124* 222*  BUN 47* 42* 40* 36* 43*  CREATININE 1.62* 1.44* 1.40* 1.24 1.51*  CALCIUM 8.5* 8.5* 8.4* 8.3* 8.5*  MG 2.3 2.5* 2.5* 2.6* 2.5*  PHOS 3.1 2.7  2.4*  --   --    Estimated Creatinine Clearance: 54.5 mL/min (A) (by C-G formula based on SCr of 1.51 mg/dL (H)).   LIVER Recent Labs  Lab 11/08/18 0500 11/09/18 0449 11/10/18 0505 11/11/18 0510 11/12/18 0105  AST 78* 77* 54* 55* 64*  ALT 77* 91* 89* 88* 111*  ALKPHOS 68 68 70 91 93  BILITOT 0.5 0.4 0.4 0.6 0.3  PROT 6.9 6.5 6.5 6.5 6.4*  ALBUMIN 3.0* 3.0* 3.1* 3.2* 3.1*     INFECTIOUS No results for input(s): LATICACIDVEN, PROCALCITON in the last 168 hours.   ENDOCRINE CBG (last 3)  Recent Labs    11/12/18 0446 11/12/18 0735 11/12/18 1152  GLUCAP 148* 82 184*         IMAGING x48h  - image(s) personally visualized  -   highlighted in bold Dg Chest Port 1 View  Result Date: 11/11/2018  CLINICAL DATA:  Acute respiratory failure due to hypoxemia. EXAM: PORTABLE CHEST 1 VIEW COMPARISON:  Radiograph November 10, 2018. FINDINGS: Stable cardiomediastinal silhouette. Left internal jugular catheter is unchanged in position. No pneumothorax or significant pleural effusion is noted. Stable bilateral lung opacities are noted concerning for edema or pneumonia. Bony thorax is unremarkable. IMPRESSION: Stable bilateral lung opacities as described above. Electronically Signed   By: Marijo Conception M.D.   On: 11/11/2018 07:34        Resolved Hospital Problem list     Assessment & Plan:  ARDS due to COVID 19 pneumonia: improved oxygenation 7/13, improved image findings 7/14  11/12/2018 - holding pattern , sp remdesivir   PLAN No further plans for actemra SOlumedrol Tolerate periods of hypoxemia, goal at rest is greater than 85% SaO2, with movement ideally above 75% Decision for intubation should be based on a change in mental status or physical evidence of ventilatory failure such as nasal flaring, accessory muscle use, paradoxical breathing Out of bed to chair as able Prone positioning while in bed   Best practice:  Diet: regular diet Pain/Anxiety/Delirium protocol (if indicated): n/a VAP protocol (if indicated): n/a DVT prophylaxis: sub q heparin GI prophylaxis: famotidine Glucose control: SSI Mobility: bed rest Code Status: full Family Communication: per [facility] Disposition: remain in ICU     ATTESTATION & SIGNATURE       Dr. Brand Males, M.D., [facility].C.P Pulmonary and Critical Care Medicine Staff Physician [facility] Pager: [phone], If no answer or between  15:00h - 7:00h: call [phone]  11/12/2018 2:25 PM

## 2018-11-12 NOTE — Progress Notes (Addendum)
Physical Therapy Treatment Patient Details Name: Andrew Larsen MRN: [id number] DOB: 03/05/1941 Today's Date: 11/12/2018    History of Present Illness Andrew Larsen is a 78 y.o. male with medical history significant of PAD, DM, HTN, BPH, OSA who presents due to SOB and nonproductive cough. Positive for Covid .  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 Reported multiple falls.    PT Comments    The patient reports that he does not feel as well today. Coughing  When patient exerts. Patient on 35 L, 60 % FiO2 with saturation 88%. Increased to 40 L for therapy. Patient ambulated x 8' x 1 with Sats dropping to 66% %and HR 130, RR in 20's with exertion. Requires several minutes to return to 88%  Before ambulating a second time x 8'. Sats remained in 80's  Until sat down and noted to drop into 70's. Possible  Readings not accurate. May try probe on forehead next visit. Probe on finger this visit. Patient did return sats to 90% and  O2 placed back to 35L. RN aware.  Continue mobility.  Follow Up Recommendations  Home health PT     Equipment Recommendations  None recommended by PT    Recommendations for Other Services       Precautions / Restrictions Precautions Precautions: Fall    Mobility  Bed Mobility               General bed mobility comments: in recliner  Transfers Overall transfer level: Needs assistance Equipment used: Rolling walker (2 wheeled) Transfers: Sit to/from Stand Sit to Stand: Min assist;+2 safety/equipment;From elevated surface         General transfer comment: Min assist from low recliner, minguard from raised bed.  Ambulation/Gait Ambulation/Gait assistance: Min guard;+2 safety/equipment Gait Distance (Feet): 8 Feet(x 2)   Gait Pattern/deviations: Step-to pattern     General Gait Details: noted steppage on both feet, second walk was more  side steps back to the recliner due to lines   Stairs             Wheelchair Mobility    Modified Rankin (Stroke Patients Only)       Balance   Sitting-balance support: Bilateral upper extremity supported;Feet supported Sitting balance-Leahy Scale: Fair     Standing balance support: Bilateral upper extremity supported;During functional activity Standing balance-Leahy Scale: Fair Standing balance comment: Reliant on UE support                            Cognition Arousal/Alertness: Awake/alert Behavior During Therapy: WFL for tasks assessed/performed;Flat affect Overall Cognitive Status: Within Functional Limits for tasks assessed                                        Exercises      General Comments        Pertinent Vitals/Pain Pain Assessment: No/denies pain    Home Living                      Prior Function            PT Goals (current goals can now be found in the care plan section) Progress towards PT goals: Progressing toward goals    Frequency    Min 3X/week  PT Plan Current plan remains appropriate    Co-evaluation PT/OT/SLP Co-Evaluation/Treatment: Yes Reason for Co-Treatment: Complexity of the patient's impairments (multi-system involvement);For patient/therapist safety PT goals addressed during session: Mobility/safety with mobility OT goals addressed during session: ADL's and self-care      AM-PAC PT "6 Clicks" Mobility   Outcome Measure  Help needed turning from your back to your side while in a flat bed without using bedrails?: None Help needed moving from lying on your back to sitting on the side of a flat bed without using bedrails?: None Help needed moving to and from a bed to a chair (including a wheelchair)?: A Little Help needed standing up from a chair using your arms (e.g., wheelchair or bedside chair)?: A Little Help needed to walk in hospital room?: A Lot Help needed  climbing 3-5 steps with a railing? : Total 6 Click Score: 17    End of Session Equipment Utilized During Treatment: Oxygen Activity Tolerance: Treatment limited secondary to medical complications (Comment) Patient left: in chair;with call bell/phone within reach;with nursing/sitter in room Nurse Communication: Mobility status PT Visit Diagnosis: Unsteadiness on feet (R26.81);Muscle weakness (generalized) (M62.81);Repeated falls (R29.6)     Time: 0300-9233 PT Time Calculation (min) (ACUTE ONLY): 23 min  Charges:  $Gait Training: 8-22 mins                     [facility] [phone] Office [phone]    Claretha Cooper 11/12/2018, 1:47 PM

## 2018-11-12 NOTE — Progress Notes (Signed)
Patients daughter [name] was called earlier today and updated by this RN. Spoke regarding patient condition and plan of care.

## 2018-11-12 NOTE — Progress Notes (Addendum)
PROGRESS NOTE  Andrew Larsen DTO:[id number] DOB: [birth year] DOA: 11/03/2018  PCP: Velna Hatchet, MD  Brief History/Interval Summary: 78 y.o.BM PMHx  T2DM, PAD, HTN, OSA, and BPH who presented with dry cough and shortness of breath with positive covid exposures. He tested positive for covid and had bilateral CXR infiltrates, admitted to [facility] and developed hypoxia.  He was initially admitted to the progressive care unit.  However his respiratory status continued to get worse and he was transferred to the intensive care unit.   Subjective/Interval History: Patient still reports some dyspnea, as well reports some cough, he remains on high flow, reports constipation, no bowel movement for last 4 days .   Assessment/Plan:  Acute Hypoxemic Resp. Failure due to Acute Covid 19 Viral Illness  COVID-19 Labs  Recent Labs    11/10/18 0505 11/11/18 0510 11/12/18 0105  DDIMER 2.32* 2.29* 2.15*  FERRITIN 599* 612* 600*  CRP 3.4* 1.8* 1.1*    Lab Results  Component Value Date   SARSCOV2NAA POSITIVE (A) 11/03/2018     Fever:   T-max was 102.9 on 7/7. Oxygen requirements: Continues to be on heated HF [place].  40 L.  60% FiO2.  Saturating in the late 80s.    Antibiotics: No antibacterials Remdesivir: x 5 days Steroids: Solu-Medrol  Diuretics: Lasix continues to be on hold. Actemra: Given on 7/10 and 7/11 Convalescent Plasma: Given on 7/11  Vitamin C and Zinc: Continue DVT Prophylaxis: Heparin 7500 units subcu every 8 hours.   Patient remains on significant oxygen requirement, he remains on 60% FiO2, 4 L flow, he was encouraged to use incentive spirometry and encouraged to prone if he can tolerate, he received Actemra, Remdesivir, plasma, and he is on steroids .  I have discussed at length with the patient, he is in significant oxygen requirement, he is at high risk for intubation, but to avoid as long it is possible.  The treatment plan and use of medications and known side effects were  discussed with patient.  Some of the medications used are based on case reports/anecdotal data which are not peer-reviewed and has not been studied using randomized control trials.  Complete risks and long-term side effects are unknown, however in the best clinical judgment they seem to be of some benefit.  Patient agrees with the treatment plan and want to receive these treatments as indicated.  Patient was given steroids and Actemra.  Abnormal chest x-ray An area of concern noted in the left lung.  Concern for pneumatocele.  Discussed with Dr. Lake Bells with pulmonology.  Plan is to repeat chest x-ray to further evaluate.  Mild transaminitis Secondary to COVID-19.  Stable.  Continue to monitor periodically.    Diabetes mellitus type 2 in the setting of obesity CBGs elevated due to steroids but stable for the most part.  Continue Lantus and SSI.  HbA1c 7.3.   Chronic kidney disease stage III -  Appears to be at baseline, creatinine is 1.5  - Continue to monitor urine output closely, try to keep negative fluid balance -  We will continue to assess for resumption of diuretics.  His weight has been stable.  Questionable history of congestive heart failure No echocardiogram reports available on epic.  Patient was on ARB and diuretics at home.  These could have been prescribed due to his CKD.  Will not do an echocardiogram at this time it is not clearly indicated.  Continue to monitor volume status.  This can be further pursued in the  outpatient setting.  History of coronary artery disease Stable.  No chest pain.  Continue home medications.  Essential hypertension Elevated most likely due to steroids.  Hydralazine as needed.  Holding diuretic and his ARB.  Amlodipine was initiated yesterday.  Will adjust tomorrow if blood pressure not optimally controlled.    History of peripheral arterial disease Continue cilostazol.  History of obstructive sleep apnea Cannot utilize CPAP at this time, he is  on heated high flow nasal cannula  History of diabetic neuropathy Continue gabapentin.  Morbid obesity BMI 36.65.  Recently diagnosed inclusion body myositis That would explain the elevated CK level.  Apparently followed at [facility] by a neurologist, Dr. Warnell Forester ([phone]).  CK level had shown improvement.   DVT Prophylaxis: Subcutaneous heparin PUD Prophylaxis: Continue Pepcid Code Status: Full code Family Communication: Discussed with the patient.  Discussing with his daughter on a daily basis.   Disposition Plan: Remains tenuous.  Continue ICU monitoring.     Reason for Visit: Acute respiratory disease due to COVID-19  Consultants: Pulmonology  Procedures: Central line placement into the left internal jugular 7/10  Antibiotics: Anti-infectives (From admission, onward)   Start     Dose/Rate Route Frequency Ordered Stop   11/08/18 1330  remdesivir 100 mg in sodium chloride 0.9 % 250 mL IVPB     100 mg 500 mL/hr over 30 Minutes Intravenous Every 24 hours 11/07/18 1229 11/11/18 1304   11/07/18 1330  remdesivir 200 mg in sodium chloride 0.9 % 250 mL IVPB     200 mg 500 mL/hr over 30 Minutes Intravenous Once 11/07/18 1229 11/08/18 1529      Medications:  Scheduled: . sodium chloride   Intravenous Once  . amLODipine  10 mg Oral Daily  . aspirin  81 mg Oral QHS  . brimonidine  1 drop Both Eyes BID  . Chlorhexidine Gluconate Cloth  6 each Topical Daily  . cilostazol  100 mg Oral BID  . famotidine  20 mg Oral QHS  . folic acid  1 mg Oral Daily  . gabapentin  600 mg Oral TID  . heparin  7,500 Units Subcutaneous Q8H  . insulin aspart  0-20 Units Subcutaneous Q4H  . insulin glargine  40 Units Subcutaneous QHS  . Ipratropium-Albuterol  2 puff Inhalation QID  . latanoprost  1 drop Both Eyes QHS  . mouth rinse  15 mL Mouth Rinse BID  . methylPREDNISolone (SOLU-MEDROL) injection  40 mg Intravenous Daily  . oxybutynin  5 mg Oral BID  . polyethylene glycol  17 g Oral  Daily  . sodium chloride flush  3 mL Intravenous Q12H  . tamsulosin  0.4 mg Oral Daily  . thiamine  100 mg Oral Daily  . timolol  1 drop Both Eyes BID  . vitamin C  500 mg Oral Daily  . zinc sulfate  220 mg Oral Daily   Continuous:  QAS:[id number], benzonatate, chlorpheniramine-HYDROcodone, guaiFENesin-dextromethorphan, hydrALAZINE, ondansetron **OR** ondansetron (ZOFRAN) IV, senna-docusate   Objective:  Vital Signs  Vitals:   11/12/18 0942 11/12/18 1000 11/12/18 1100 11/12/18 1146  BP: (!) 123/92 (!) 141/87 (!) 142/95   Pulse:  (!) 112 (!) 101   Resp:  19 17   Temp:    97.7 F (36.5 C)  TempSrc:    Oral  SpO2:  (!) 83% 95%   Weight:      Height:        Intake/Output Summary (Last 24 hours) at 11/12/2018 1221 Last data filed at 11/12/2018  1000 Gross per 24 hour  Intake 880 ml  Output 2475 ml  Net -1595 ml   Filed Weights   11/09/18 0500 11/10/18 0440 11/12/18 0500  Weight: 118.2 kg 119.8 kg 118.6 kg    Awake Alert, Oriented X 3, No new F.N deficits, obese, sitting at the edge of the bed Symmetrical Chest wall movement, Good air movement bilaterally, coarse respiratory sounds bilaterally Tachycardic,No Gallops,Rubs or new Murmurs, No Parasternal Heave +ve B.Sounds, Abd Soft, No tenderness, No rebound - guarding or rigidity. No Cyanosis, Clubbing or edema, No new Rash or bruise     Lab Results:  Data Reviewed: I have personally reviewed following labs and imaging studies  CBC: Recent Labs  Lab 11/07/18 0500  11/07/18 1312 11/09/18 0449 11/10/18 0505 11/11/18 0510 11/12/18 0105  WBC 11.7*  --   --  12.4* 14.3* 19.5* 19.5*  NEUTROABS 10.1*  --   --  10.3* 11.2* 15.5* 15.1*  HGB 14.9   < > 15.6 13.9 14.7 15.3 15.9  HCT 43.9   < > 46.0 42.1 43.9 46.4 47.0  MCV 91.1  --   --  89.6 91.3 91.2 90.2  PLT 198  --   --  278 326 411* 377   < > = values in this interval not displayed.    Basic Metabolic Panel: Recent Labs  Lab 11/08/18 0500 11/09/18 0449  11/10/18 0505 11/11/18 0510 11/12/18 0105  NA 134* 137 138 139 137  K 4.1 3.9 4.2 3.7 4.6  CL 98 100 104 106 105  CO2 25 26 23 23 22   GLUCOSE 164* 175* 203* 124* 222*  BUN 47* 42* 40* 36* 43*  CREATININE 1.62* 1.44* 1.40* 1.24 1.51*  CALCIUM 8.5* 8.5* 8.4* 8.3* 8.5*  MG 2.3 2.5* 2.5* 2.6* 2.5*  PHOS 3.1 2.7 2.4*  --   --     GFR: Estimated Creatinine Clearance: 54.5 mL/min (A) (by C-G formula based on SCr of 1.51 mg/dL (H)).  Liver Function Tests: Recent Labs  Lab 11/08/18 0500 11/09/18 0449 11/10/18 0505 11/11/18 0510 11/12/18 0105  AST 78* 77* 54* 55* 64*  ALT 77* 91* 89* 88* 111*  ALKPHOS 68 68 70 91 93  BILITOT 0.5 0.4 0.4 0.6 0.3  PROT 6.9 6.5 6.5 6.5 6.4*  ALBUMIN 3.0* 3.0* 3.1* 3.2* 3.1*     Cardiac Enzymes: Recent Labs  Lab 11/08/18 0500 11/09/18 0449  CKTOTAL 777* 422*    HbA1C: No results for input(s): HGBA1C in the last 72 hours.  CBG: Recent Labs  Lab 11/11/18 1953 11/11/18 2359 11/12/18 0446 11/12/18 0735 11/12/18 1152  GLUCAP 335* 222* 148* 82 184*    Lipid Profile: No results for input(s): CHOL, HDL, LDLCALC, TRIG, CHOLHDL, LDLDIRECT in the last 72 hours.  Anemia Panel: Recent Labs    11/11/18 0510 11/12/18 0105  FERRITIN 612* 600*    Recent Results (from the past 240 hour(s))  Culture, blood (routine x 2)     Status: None   Collection Time: 11/03/18  2:29 PM   Specimen: BLOOD  Result Value Ref Range Status   Specimen Description   Final    BLOOD RIGHT ANTECUBITAL Performed at [facility] [address], [place], [place] [address]    Special Requests   Final    BOTTLES DRAWN AEROBIC AND ANAEROBIC Blood Culture adequate volume Performed at [facility] [address], [place], [place] [address]    Culture   Final    NO GROWTH  5 DAYS Performed at [facility], [place] [address] [place], [place] [address]    Report Status 11/08/2018 FINAL  Final  Culture, blood (routine x 2)      Status: None   Collection Time: 11/03/18  2:29 PM   Specimen: BLOOD RIGHT HAND  Result Value Ref Range Status   Specimen Description   Final    BLOOD RIGHT HAND Performed at [facility] [address] [place], [place] [address]    Special Requests   Final    BOTTLES DRAWN AEROBIC AND ANAEROBIC Blood Culture adequate volume Performed at [facility] [address] [place], [place] [address]    Culture   Final    NO GROWTH 5 DAYS Performed at [facility], [place] [address] [place], [place] [address]    Report Status 11/08/2018 FINAL  Final  SARS Coronavirus 2 (CEPHEID- Performed in [facility] hospital lab), Hosp Order     Status: Abnormal   Collection Time: 11/03/18  2:29 PM   Specimen: Nasopharyngeal Swab  Result Value Ref Range Status   SARS Coronavirus 2 POSITIVE (A) NEGATIVE Final    Comment: RESULT CALLED TO, READ BACK BY AND VERIFIED WITH: ZULETA,C AT 1600 ON 11/03/2018 BY JPM (NOTE) If result is NEGATIVE SARS-CoV-2 target nucleic acids are NOT DETECTED. The SARS-CoV-2 RNA is generally detectable in upper and lower  respiratory specimens during the acute phase of infection. The lowest  concentration of SARS-CoV-2 viral copies this assay can detect is 250  copies / mL. A negative result does not preclude SARS-CoV-2 infection  and should not be used as the sole basis for treatment or other  patient management decisions.  A negative result may occur with  improper specimen collection / handling, submission of specimen other  than nasopharyngeal swab, presence of viral mutation(s) within the  areas targeted by this assay, and inadequate number of viral copies  (<250 copies / mL). A negative result must be combined with clinical  observations, patient history, and epidemiological information. If result is POSITIVE SARS-CoV-2 target nucleic acids are DETECTED. T he SARS-CoV-2 RNA is generally detectable in upper and lower   respiratory specimens during the acute phase of infection.  Positive  results are indicative of active infection with SARS-CoV-2.  Clinical  correlation with patient history and other diagnostic information is  necessary to determine patient infection status.  Positive results do  not rule out bacterial infection or co-infection with other viruses. If result is PRESUMPTIVE POSTIVE SARS-CoV-2 nucleic acids MAY BE PRESENT.   A presumptive positive result was obtained on the submitted specimen  and confirmed on repeat testing.  While 2019 novel coronavirus  (SARS-CoV-2) nucleic acids may be present in the submitted sample  additional confirmatory testing may be necessary for epidemiological  and / or clinical management purposes  to differentiate between  SARS-CoV-2 and other Sarbecovirus currently known to infect humans.  If clinically indicated additional testing with an alternate test  methodology [phone]) is  advised. The SARS-CoV-2 RNA is generally  detectable in upper and lower respiratory specimens during the acute  phase of infection. The expected result is Negative. Fact Sheet for Patients:  StrictlyIdeas.no Fact Sheet for Healthcare Providers: BankingDealers.co.za This test is not yet approved or cleared by the [place] FDA and has been authorized for detection and/or diagnosis of SARS-CoV-2 by FDA under an Emergency Use Authorization (EUA).  This EUA will remain in effect (meaning this test can be  used) for the duration of the COVID-19 declaration under Section 564(b)(1) of the Act, 21 U.S.C. section 360bbb-3(b)(1), unless the authorization is terminated or revoked sooner. Performed at [facility], [place] [address], [place], [place] [address]       Radiology Studies: Dg Chest Port 1 View  Result Date: 11/11/2018 CLINICAL DATA:  Acute respiratory failure due to hypoxemia. EXAM: PORTABLE CHEST 1 VIEW  COMPARISON:  Radiograph November 10, 2018. FINDINGS: Stable cardiomediastinal silhouette. Left internal jugular catheter is unchanged in position. No pneumothorax or significant pleural effusion is noted. Stable bilateral lung opacities are noted concerning for edema or pneumonia. Bony thorax is unremarkable. IMPRESSION: Stable bilateral lung opacities as described above. Electronically Signed   By: Marijo Conception M.D.   On: 11/11/2018 07:34       LOS: 7 days   Phillips Climes MD  [facility] Pager on [email or web address]  11/12/2018, 12:21 PM

## 2018-11-12 NOTE — Plan of Care (Signed)
Patient was OOB to chair for a couple of hours. Worked with PT/OT and ambulated from the chair to bed and back a few times. Oxygen requirements less than yesterday but still significant. Resting sats mid 90s but with activity or coughing spells down to mid 70s. Takes patient about a few minutes to recuperate and increase sats back to baseline. Denies any pain. Received a bath today. Using IS and achieving 1200. Voiding adequate amounts. Has not had a bowel movement in a few days, discussed with team- new orders placed. Good appetite. Family and patient updated regarding plan of care.    Problem: Education: Goal: Knowledge of risk factors and measures for prevention of condition will improve Outcome: Progressing   Problem: Respiratory: Goal: Will maintain a patent airway Outcome: Progressing   Problem: Activity: Goal: Risk for activity intolerance will decrease Outcome: Progressing   Problem: Nutrition: Goal: Adequate nutrition will be maintained Outcome: Progressing   Problem: Pain Managment: Goal: General experience of comfort will improve Outcome: Progressing   Problem: Respiratory: Goal: Will maintain a patent airway Outcome: Progressing

## 2018-11-12 NOTE — Progress Notes (Signed)
This RN asked if patient wanted to set up facetime chat with family. Patient declined at this time. This RN educated that time can be arranged if patient changed his mind.

## 2018-11-12 NOTE — Progress Notes (Signed)
Occupational Therapy Treatment Patient Details Name: Andrew Larsen MRN: [id number] DOB: December 08, 1940 Today's Date: 11/12/2018    History of present illness Andrew Larsen is a 78 y.o. male with medical history significant of PAD, DM, HTN, BPH, OSA who presents due to SOB and nonproductive cough. Positive for Covid .  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 Reported multiple falls.   OT comments  Pt progressing towards established OT goals and is highly motivated to participate in therapy. Upon arrival, pt resting in recliner on 35L O2 via HFNC and SpO2 88%. Pt performing functional mobility in room with Min A +2 and RW. With activity, Spo2 dropping to 67% on 40L O2 via HFNC; pt able to recover with seated rest break and purse lip breathing. HR elevating to 130s and RR 30s. Continue to recommend dc to home with HHOT and will continue to follow acutely as admitted.    Follow Up Recommendations  Home health OT;Supervision/Assistance - 24 hour    Equipment Recommendations  3 in 1 bedside commode;Tub/shower seat    Recommendations for Other Services PT consult    Precautions / Restrictions Precautions Precautions: Fall Precaution Comments: right LE is much weaker than left and may buckle Restrictions Weight Bearing Restrictions: No       Mobility Bed Mobility               General bed mobility comments: In recliner upon arrival  Transfers Overall transfer level: Needs assistance Equipment used: Rolling walker (2 wheeled) Transfers: Sit to/from Stand Sit to Stand: Min assist;+2 safety/equipment;From elevated surface         General transfer comment: Min assist from low recliner, minguard from raised bed.    Balance Overall balance assessment: Needs assistance Sitting-balance support: Bilateral upper extremity supported;Feet supported Sitting balance-Leahy Scale:  Fair     Standing balance support: Bilateral upper extremity supported;During functional activity Standing balance-Leahy Scale: Fair Standing balance comment: Reliant on UE support                           ADL either performed or assessed with clinical judgement   ADL Overall ADL's : Needs assistance/impaired                         Toilet Transfer: Minimal assistance;+2 for physical assistance;+2 for safety/equipment;RW(simulated in room) Toilet Transfer Details (indicate cue type and reason): Min A for balance and stability         Functional mobility during ADLs: Minimal assistance;+2 for physical assistance;+2 for safety/equipment;Rolling walker General ADL Comments: Pt motivated to participate in therapy. Performing sit<>stand and then lateral steps at EOB three times. Requiring seated rest breaks inbetween     Vision       Perception     Praxis      Cognition Arousal/Alertness: Awake/alert Behavior During Therapy: WFL for tasks assessed/performed;Flat affect Overall Cognitive Status: Within Functional Limits for tasks assessed                                          Exercises     Shoulder Instructions       General Comments At rest, SpO2 89% on 40L O2 via HFNC, HR 60s, and RR 16. SpO2 dropping to 67%  on 40L O2 with mobility, HR elevating to 130s, and RR 30s.    Pertinent Vitals/ Pain       Pain Assessment: Faces Faces Pain Scale: Hurts little more Pain Location: chest from coughing Pain Descriptors / Indicators: Discomfort Pain Intervention(s): Monitored during session;Repositioned  Home Living                                          Prior Functioning/Environment              Frequency  Min 3X/week        Progress Toward Goals  OT Goals(current goals can now be found in the care plan section)  Progress towards OT goals: Progressing toward goals  Acute Rehab OT Goals Patient  Stated Goal: to feel better, home soon OT Goal Formulation: With patient Time For Goal Achievement: 11/18/18 Potential to Achieve Goals: Good ADL Goals Pt Will Perform Grooming: with min guard assist;standing Pt Will Perform Lower Body Dressing: with min guard assist;with adaptive equipment;sit to/from stand Pt Will Transfer to Toilet: with min guard assist;bedside commode;ambulating Pt Will Perform Toileting - Clothing Manipulation and hygiene: with min guard assist;sit to/from stand;sitting/lateral leans Pt Will Perform Tub/Shower Transfer: with min assist;shower seat;ambulating;Tub transfer  Plan Discharge plan remains appropriate    Co-evaluation    PT/OT/SLP Co-Evaluation/Treatment: Yes Reason for Co-Treatment: For patient/therapist safety;To address functional/ADL transfers PT goals addressed during session: Mobility/safety with mobility OT goals addressed during session: ADL's and self-care      AM-PAC OT "6 Clicks" Daily Activity     Outcome Measure   Help from another person eating meals?: None Help from another person taking care of personal grooming?: A Little Help from another person toileting, which includes using toliet, bedpan, or urinal?: A Lot Help from another person bathing (including washing, rinsing, drying)?: A Lot Help from another person to put on and taking off regular upper body clothing?: A Little Help from another person to put on and taking off regular lower body clothing?: A Lot 6 Click Score: 16    End of Session Equipment Utilized During Treatment: Gait belt;Rolling walker;Oxygen(40L with FiO2 65% via HFNC)  OT Visit Diagnosis: Unsteadiness on feet (R26.81);Other abnormalities of gait and mobility (R26.89);Muscle weakness (generalized) (M62.81)   Activity Tolerance Patient tolerated treatment well   Patient Left in chair;with call bell/phone within reach;with chair alarm set   Nurse Communication Mobility status        Time: 9678-9381 OT  Time Calculation (min): 23 min  Charges: OT General Charges $OT Visit: 1 Visit OT Treatments $Self Care/Home Management : 8-22 mins  [place], OTR/L Acute Rehab Pager: [phone] Office: [place] 11/12/2018, 2:52 PM

## 2018-11-12 NOTE — Plan of Care (Signed)
Pt is resting comfortably. Occasionally he has coughing events that cause a drop in his Sp02 about 20 seconds after the coughing stops. Sp02 drops to the high 70s. He gets tachycardic in the 120s-130s at the same time. Pt denies pain and complains of thirst frequently.

## 2018-11-13 ENCOUNTER — Inpatient Hospital Stay (HOSPITAL_COMMUNITY): Payer: Medicare HMO

## 2018-11-13 LAB — CBC WITH DIFFERENTIAL/PLATELET
Abs Immature Granulocytes: 0.98 10*3/uL — ABNORMAL HIGH (ref 0.00–0.07)
Basophils Absolute: 0.1 10*3/uL (ref 0.0–0.1)
Basophils Relative: 0 %
Eosinophils Absolute: 0.1 10*3/uL (ref 0.0–0.5)
Eosinophils Relative: 0 %
HCT: 48.4 % (ref 39.0–52.0)
Hemoglobin: 15.9 g/dL (ref 13.0–17.0)
Immature Granulocytes: 6 %
Lymphocytes Relative: 10 %
Lymphs Abs: 1.8 10*3/uL (ref 0.7–4.0)
MCH: 29.9 pg (ref 26.0–34.0)
MCHC: 32.9 g/dL (ref 30.0–36.0)
MCV: 91.1 fL (ref 80.0–100.0)
Monocytes Absolute: 1.3 10*3/uL — ABNORMAL HIGH (ref 0.1–1.0)
Monocytes Relative: 8 %
Neutro Abs: 13.2 10*3/uL — ABNORMAL HIGH (ref 1.7–7.7)
Neutrophils Relative %: 76 %
Platelets: 353 10*3/uL (ref 150–400)
RBC: 5.31 MIL/uL (ref 4.22–5.81)
RDW: 13.7 % (ref 11.5–15.5)
WBC: 17.4 10*3/uL — ABNORMAL HIGH (ref 4.0–10.5)
nRBC: 0 % (ref 0.0–0.2)

## 2018-11-13 LAB — GLUCOSE, CAPILLARY
Glucose-Capillary: 122 mg/dL — ABNORMAL HIGH (ref 70–99)
Glucose-Capillary: 169 mg/dL — ABNORMAL HIGH (ref 70–99)
Glucose-Capillary: 258 mg/dL — ABNORMAL HIGH (ref 70–99)
Glucose-Capillary: 294 mg/dL — ABNORMAL HIGH (ref 70–99)
Glucose-Capillary: 359 mg/dL — ABNORMAL HIGH (ref 70–99)
Glucose-Capillary: 502 mg/dL (ref 70–99)
Glucose-Capillary: 51 mg/dL — ABNORMAL LOW (ref 70–99)
Glucose-Capillary: 64 mg/dL — ABNORMAL LOW (ref 70–99)
Glucose-Capillary: 86 mg/dL (ref 70–99)

## 2018-11-13 LAB — COMPREHENSIVE METABOLIC PANEL
ALT: 113 U/L — ABNORMAL HIGH (ref 0–44)
AST: 57 U/L — ABNORMAL HIGH (ref 15–41)
Albumin: 3.3 g/dL — ABNORMAL LOW (ref 3.5–5.0)
Alkaline Phosphatase: 95 U/L (ref 38–126)
Anion gap: 10 (ref 5–15)
BUN: 42 mg/dL — ABNORMAL HIGH (ref 8–23)
CO2: 22 mmol/L (ref 22–32)
Calcium: 8.7 mg/dL — ABNORMAL LOW (ref 8.9–10.3)
Chloride: 105 mmol/L (ref 98–111)
Creatinine, Ser: 1.44 mg/dL — ABNORMAL HIGH (ref 0.61–1.24)
GFR calc Af Amer: 54 mL/min — ABNORMAL LOW (ref >60)
GFR calc non Af Amer: 47 mL/min — ABNORMAL LOW (ref >60)
Glucose, Bld: 87 mg/dL (ref 70–99)
Potassium: 4.5 mmol/L (ref 3.5–5.1)
Sodium: 137 mmol/L (ref 135–145)
Total Bilirubin: 0.5 mg/dL (ref 0.3–1.2)
Total Protein: 6.2 g/dL — ABNORMAL LOW (ref 6.5–8.1)

## 2018-11-13 LAB — D-DIMER, QUANTITATIVE: D-Dimer, Quant: 1.87 ug/mL-FEU — ABNORMAL HIGH (ref 0.00–0.50)

## 2018-11-13 LAB — C-REACTIVE PROTEIN: CRP: 0.8 mg/dL (ref <1.0)

## 2018-11-13 MED ORDER — INSULIN ASPART PROT & ASPART (70-30 MIX) 100 UNIT/ML ~~LOC~~ SUSP
25.0000 [IU] | Freq: Two times a day (BID) | SUBCUTANEOUS | Status: DC
  Administered 2018-11-13 – 2018-11-14 (×2): 25 [IU] via SUBCUTANEOUS
  Filled 2018-11-13: qty 10

## 2018-11-13 MED ORDER — FUROSEMIDE 10 MG/ML IJ SOLN: 40.0000 mg | Freq: Once | INTRAMUSCULAR | Status: DC

## 2018-11-13 MED ORDER — SALINE SPRAY 0.65 % NA SOLN
1.0000 | NASAL | Status: DC | PRN
  Filled 2018-11-13: qty 44

## 2018-11-13 MED ORDER — DEXTROSE 50 % IV SOLN
INTRAVENOUS | Status: AC
  Filled 2018-11-13: qty 50

## 2018-11-13 MED ORDER — INSULIN GLARGINE 100 UNIT/ML ~~LOC~~ SOLN: 20.0000 [IU] | Freq: Every day | SUBCUTANEOUS | Status: DC

## 2018-11-13 MED ORDER — DEXTROSE 50 % IV SOLN
25.0000 g | INTRAVENOUS | Status: AC
  Administered 2018-11-13: 25 g via INTRAVENOUS

## 2018-11-13 MED ORDER — FUROSEMIDE 10 MG/ML IJ SOLN
40.0000 mg | Freq: Four times a day (QID) | INTRAMUSCULAR | Status: AC
  Administered 2018-11-13 (×2): 40 mg via INTRAVENOUS
  Filled 2018-11-13 (×2): qty 4

## 2018-11-13 MED ORDER — BISACODYL 5 MG PO TBEC: 10.0000 mg | DELAYED_RELEASE_TABLET | Freq: Once | ORAL | Status: DC

## 2018-11-13 MED ORDER — POLYETHYLENE GLYCOL 3350 17 G PO PACK: 17.0000 g | PACK | Freq: Once | ORAL | Status: DC

## 2018-11-13 NOTE — Progress Notes (Signed)
Spoke with patient's daughter, [name], answered questions and update given. Switched over to [employer] and phone given to patient to allow conversation to continue.

## 2018-11-13 NOTE — Progress Notes (Signed)
Pt CBG 502, MD aware--to give 25 units scheduled Novolog mix 70/30 and 20 units Novolog. Per MD no need for lab verification.

## 2018-11-13 NOTE — Progress Notes (Signed)
No acute events overnight. VSS. No complaints of pain. sats continue to drop with movement in bed and coughing.

## 2018-11-13 NOTE — Progress Notes (Signed)
Physical Therapy Treatment Patient Details Name: Andrew Larsen MRN: [id number] DOB: 1940/08/05 Today's Date: 11/13/2018    History of Present Illness [name] is a 78 y.o. male with medical history significant of PAD, DM, HTN, BPH, OSA who presents due to SOB and nonproductive cough. Positive for Covid .  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 Reported multiple falls.    PT Comments    The patient is on 10 L HFNC. HR 131 at rest with increased to 144 with activity, SaO2 97% at rest with SaO2 dropping to 88%. RR remained in mid-high 20's. Patient coughing after exertion. Reports nose is "stopped Up".  Continue  Progressive  Mobility as patient status allows.  Follow Up Recommendations  Home health PT     Equipment Recommendations  None recommended by PT    Recommendations for Other Services       Precautions / Restrictions Precautions Precautions: Fall Precaution Comments: right LE is much weaker than left and may buckle    Mobility  Bed Mobility               General bed mobility comments: In recliner upon arrival  Transfers Overall transfer level: Needs assistance Equipment used: Rolling walker (2 wheeled) Transfers: Sit to/from Stand Sit to Stand: Min assist;+2 safety/equipment         General transfer comment: Min assist from low recliner, drecreased descent control. cues for hand placement  Ambulation/Gait Ambulation/Gait assistance: +2 safety/equipment;Min assist   Assistive device: Rolling walker (2 wheeled)       General Gait Details: took 3 steps forward then back, sat and rested. Then took 5 steps forward and backward.   Stairs             Wheelchair Mobility    Modified Rankin (Stroke Patients Only)       Balance                                            Cognition   Behavior During Therapy: WFL  for tasks assessed/performed;Flat affect Overall Cognitive Status: Within Functional Limits for tasks assessed                                 General Comments: appears weary, coughing frequently.      Exercises General Exercises - Lower Extremity Ankle Circles/Pumps: AROM;Both;5 reps;Seated Long Arc Quad: AROM;Both;5 reps;Seated Hip Flexion/Marching: AROM;Both;5 reps;Seated    General Comments        Pertinent Vitals/Pain Pain Assessment: No/denies pain    Home Living                      Prior Function            PT Goals (current goals can now be found in the care plan section) Progress towards PT goals: Progressing toward goals    Frequency    Min 3X/week      PT Plan Current plan remains appropriate    Co-evaluation PT/OT/SLP Co-Evaluation/Treatment: Yes Reason for Co-Treatment: Complexity of the patient's impairments (multi-system involvement);For patient/therapist safety PT goals addressed during session: Mobility/safety with mobility OT goals addressed during session: ADL's and self-care      AM-PAC PT "6 Clicks" Mobility  Outcome Measure  Help needed turning from your back to your side while in a flat bed without using bedrails?: A Little Help needed moving from lying on your back to sitting on the side of a flat bed without using bedrails?: A Little Help needed moving to and from a bed to a chair (including a wheelchair)?: A Lot Help needed standing up from a chair using your arms (e.g., wheelchair or bedside chair)?: A Lot Help needed to walk in hospital room?: A Lot Help needed climbing 3-5 steps with a railing? : Total 6 Click Score: 13    End of Session Equipment Utilized During Treatment: Oxygen Activity Tolerance: Treatment limited secondary to medical complications (Comment) Patient left: in chair;with call bell/phone within reach;with nursing/sitter in room Nurse Communication: Mobility status PT Visit Diagnosis:  Unsteadiness on feet (R26.81);Muscle weakness (generalized) (M62.81);Repeated falls (R29.6)     Time: 9977-4142 PT Time Calculation (min) (ACUTE ONLY): 30 min  Charges:  $Gait Training: 8-22 mins                       [place] Pager [phone] Office [phone]    Claretha Cooper 11/13/2018, 5:12 PM

## 2018-11-13 NOTE — Progress Notes (Signed)
PROGRESS NOTE  Andrew Larsen UDJ:[id number] DOB: [birth year] DOA: 11/03/2018  PCP: Velna Hatchet, MD  Brief History/Interval Summary:  78 y.o.BM PMHx  T2DM, PAD, HTN, OSA, and BPH who presented with dry cough and shortness of breath with positive covid exposures. He tested positive for covid and had bilateral CXR infiltrates, admitted to [facility] and developed hypoxia.  He was initially admitted to the progressive care unit.  However his respiratory status continued to get worse and he was transferred to the intensive care unit.   Subjective/Interval History:  No significant events overnight, he reports no bowel movement, he had good night sleep, still reports cough, and dyspnea.   Assessment/Plan:  Acute Hypoxemic Resp. Failure due to Acute Covid 19 Viral Illness  COVID-19 Labs  Recent Labs    11/11/18 0510 11/12/18 0105 11/13/18 0500  DDIMER 2.29* 2.15* 1.87*  FERRITIN 612* 600*  --   CRP 1.8* 1.1* 0.8    Lab Results  Component Value Date   SARSCOV2NAA POSITIVE (A) 11/03/2018     Fever:   T-max was 102.9 on 7/7. Oxygen requirements: Continues to be on heated high flow nasal cannula, able to wean to 35 L overnight, he remains on 60% FiO2,. Antibiotics: No antibacterials Remdesivir: x 5 days Steroids: Solu-Medrol  Diuretics: will give lasix today Actemra: Given on 7/10 and 7/11 Convalescent Plasma: Given on 7/11  Vitamin C and Zinc: Continue DVT Prophylaxis: Lovenox 60  mg subcu every 12 hours  Patient remains with significant oxygen requirement, but have less requirement today, he was encouraged to use incentive spirometry, unable to bring him, but he was encouraged at least to sleep on his side, he received Actemra, Remdesivir, plasma, he remains on steroids, continue to trend inflammatory markers.  The treatment plan and use of medications and known side effects were discussed with patient.  Some of the medications used are based on case reports/anecdotal data  which are not peer-reviewed and has not been studied using randomized control trials.  Complete risks and long-term side effects are unknown, however in the best clinical judgment they seem to be of some benefit.  Patient agrees with the treatment plan and want to receive these treatments as indicated.  Patient was given steroids and Actemra.  Abnormal chest x-ray An area of concern noted in the left lung.  Concern for pneumatocele.  Previous MDDiscussed with Dr. Lake Bells with pulmonology.  Plan is to repeat chest x-ray to further evaluate.  Mild transaminitis Secondary to COVID-19.  Stable.  Continue to monitor periodically.    Diabetes mellitus type 2 in the setting of obesity CBG labile, but low this morning at 51, I will decrease his evening Lantus from 40 to 20 units . Continue Lantus and SSI.  HbA1c 7.3.   Chronic kidney disease stage III -  Appears to be at baseline, creatinine is 1.5  - Continue to monitor urine output closely, try to keep negative fluid balance -  We will continue to assess for resumption of diuretics.  His weight has been stable.  Questionable history of congestive heart failure No echocardiogram reports available on epic.  Patient was on ARB and diuretics at home.  These could have been prescribed due to his CKD.  Will not do an echocardiogram at this time it is not clearly indicated.  Continue to monitor volume status.  This can be further pursued in the outpatient setting.  History of coronary artery disease Stable.  No chest pain.  Continue home medications.  Essential  hypertension Elevated most likely due to steroids.  Hydralazine as needed.  Holding diuretic and his ARB.  Amlodipine was initiated yesterday.  Will adjust tomorrow if blood pressure not optimally controlled.    History of peripheral arterial disease Continue cilostazol.  History of obstructive sleep apnea Cannot utilize CPAP at this time, he is on heated high flow nasal cannula  History of  diabetic neuropathy Continue gabapentin.  Morbid obesity BMI 36.65.  Recently diagnosed inclusion body myositis That would explain the elevated CK level.  Apparently followed at [facility] by a neurologist, Dr. Warnell Forester ([phone]).  CK level had shown improvement.   DVT Prophylaxis: Subcutaneous heparin PUD Prophylaxis: Continue Pepcid Code Status: Full code Family Communication: Discussed with the patient.  Discussing with his daughter on a daily basis.   Disposition Plan: Remains tenuous.  Continue ICU monitoring.     Reason for Visit: Acute respiratory disease due to COVID-19  Consultants: Pulmonology  Procedures: Central line placement into the left internal jugular 7/10  Antibiotics: Anti-infectives (From admission, onward)   Start     Dose/Rate Route Frequency Ordered Stop   11/08/18 1330  remdesivir 100 mg in sodium chloride 0.9 % 250 mL IVPB     100 mg 500 mL/hr over 30 Minutes Intravenous Every 24 hours 11/07/18 1229 11/11/18 1304   11/07/18 1330  remdesivir 200 mg in sodium chloride 0.9 % 250 mL IVPB     200 mg 500 mL/hr over 30 Minutes Intravenous Once 11/07/18 1229 11/08/18 1529      Medications:  Scheduled: . sodium chloride   Intravenous Once  . amLODipine  10 mg Oral Daily  . aspirin  81 mg Oral QHS  . brimonidine  1 drop Both Eyes BID  . Chlorhexidine Gluconate Cloth  6 each Topical Daily  . cilostazol  100 mg Oral BID  . enoxaparin (LOVENOX) injection  60 mg Subcutaneous Q12H  . famotidine  20 mg Oral QHS  . folic acid  1 mg Oral Daily  . gabapentin  600 mg Oral TID  . insulin aspart  0-20 Units Subcutaneous Q4H  . insulin glargine  40 Units Subcutaneous QHS  . Ipratropium-Albuterol  2 puff Inhalation QID  . latanoprost  1 drop Both Eyes QHS  . mouth rinse  15 mL Mouth Rinse BID  . methylPREDNISolone (SOLU-MEDROL) injection  40 mg Intravenous Daily  . oxybutynin  5 mg Oral BID  . polyethylene glycol  17 g Oral BID  . polyethylene glycol  17 g  Oral Once  . sodium chloride flush  3 mL Intravenous Q12H  . tamsulosin  0.4 mg Oral Daily  . thiamine  100 mg Oral Daily  . timolol  1 drop Both Eyes BID  . vitamin C  500 mg Oral Daily  . zinc sulfate  220 mg Oral Daily   Continuous:  HAL:[id number], benzonatate, chlorpheniramine-HYDROcodone, guaiFENesin-dextromethorphan, hydrALAZINE, ondansetron **OR** ondansetron (ZOFRAN) IV, senna-docusate   Objective:  Vital Signs  Vitals:   11/13/18 0700 11/13/18 0800 11/13/18 0900 11/13/18 1000  BP: 131/78 127/70 118/72 137/75  Pulse: 92 90 (!) 106 (!) 112  Resp: 16 16 16 17   Temp:  97.7 F (36.5 C)    TempSrc:  Oral    SpO2: 92% 95% (!) 82% 96%  Weight:      Height:        Intake/Output Summary (Last 24 hours) at 11/13/2018 1050 Last data filed at 11/13/2018 1000 Gross per 24 hour  Intake 1320 ml  Output  1775 ml  Net -455 ml   Filed Weights   11/10/18 0440 11/12/18 0500 11/13/18 0500  Weight: 119.8 kg 118.6 kg 118.2 kg    Awake Alert, Oriented X 3, No new F.N deficits, Normal affect Symmetrical Chest wall movement, Good air movement bilaterally, CTAB RRR,No Gallops,Rubs or new Murmurs, No Parasternal Heave +ve B.Sounds, Abd Soft, No tenderness, No rebound - guarding or rigidity. No Cyanosis, Clubbing or edema, No new Rash or bruise      Lab Results:  Data Reviewed: I have personally reviewed following labs and imaging studies  CBC: Recent Labs  Lab 11/09/18 0449 11/10/18 0505 11/11/18 0510 11/12/18 0105 11/13/18 0500  WBC 12.4* 14.3* 19.5* 19.5* 17.4*  NEUTROABS 10.3* 11.2* 15.5* 15.1* 13.2*  HGB 13.9 14.7 15.3 15.9 15.9  HCT 42.1 43.9 46.4 47.0 48.4  MCV 89.6 91.3 91.2 90.2 91.1  PLT 278 326 411* 377 027    Basic Metabolic Panel: Recent Labs  Lab 11/08/18 0500 11/09/18 0449 11/10/18 0505 11/11/18 0510 11/12/18 0105 11/13/18 0500  NA 134* 137 138 139 137 137  K 4.1 3.9 4.2 3.7 4.6 4.5  CL 98 100 104 106 105 105  CO2 25 26 23 23 22 22    GLUCOSE 164* 175* 203* 124* 222* 87  BUN 47* 42* 40* 36* 43* 42*  CREATININE 1.62* 1.44* 1.40* 1.24 1.51* 1.44*  CALCIUM 8.5* 8.5* 8.4* 8.3* 8.5* 8.7*  MG 2.3 2.5* 2.5* 2.6* 2.5*  --   PHOS 3.1 2.7 2.4*  --   --   --     GFR: Estimated Creatinine Clearance: 57.1 mL/min (A) (by C-G formula based on SCr of 1.44 mg/dL (H)).  Liver Function Tests: Recent Labs  Lab 11/09/18 0449 11/10/18 0505 11/11/18 0510 11/12/18 0105 11/13/18 0500  AST 77* 54* 55* 64* 57*  ALT 91* 89* 88* 111* 113*  ALKPHOS 68 70 91 93 95  BILITOT 0.4 0.4 0.6 0.3 0.5  PROT 6.5 6.5 6.5 6.4* 6.2*  ALBUMIN 3.0* 3.1* 3.2* 3.1* 3.3*     Cardiac Enzymes: Recent Labs  Lab 11/08/18 0500 11/09/18 0449  CKTOTAL 777* 422*    HbA1C: No results for input(s): HGBA1C in the last 72 hours.  CBG: Recent Labs  Lab 11/13/18 0010 11/13/18 0423 11/13/18 0738 11/13/18 0807 11/13/18 0847  GLUCAP 258* 122* 51* 64* 86    Lipid Profile: No results for input(s): CHOL, HDL, LDLCALC, TRIG, CHOLHDL, LDLDIRECT in the last 72 hours.  Anemia Panel: Recent Labs    11/11/18 0510 11/12/18 0105  FERRITIN 612* 600*    Recent Results (from the past 240 hour(s))  Culture, blood (routine x 2)     Status: None   Collection Time: 11/03/18  2:29 PM   Specimen: BLOOD  Result Value Ref Range Status   Specimen Description   Final    BLOOD RIGHT ANTECUBITAL Performed at [facility] [address], [place], [place] [address]    Special Requests   Final    BOTTLES DRAWN AEROBIC AND ANAEROBIC Blood Culture adequate volume Performed at [facility] [address], [place], [place] [address]    Culture   Final    NO GROWTH 5 DAYS Performed at [facility], [place] [address], [place],  [address]    Report Status 11/08/2018 FINAL  Final  Culture, blood (routine x 2)     Status: None   Collection Time: 11/03/18  2:29 PM   Specimen: BLOOD RIGHT HAND  Result Value Ref Range  Status   Specimen Description   Final    BLOOD RIGHT HAND Performed at [facility] [address] [place], [place] [address]    Special Requests   Final    BOTTLES DRAWN AEROBIC AND ANAEROBIC Blood Culture adequate volume Performed at [facility] [address], [place], [place] [address]    Culture   Final    NO GROWTH 5 DAYS Performed at [facility], [place] [address] [place], [place] [address]    Report Status 11/08/2018 FINAL  Final  SARS Coronavirus 2 (CEPHEID- Performed in [facility] hospital lab), Hosp Order     Status: Abnormal   Collection Time: 11/03/18  2:29 PM   Specimen: Nasopharyngeal Swab  Result Value Ref Range Status   SARS Coronavirus 2 POSITIVE (A) NEGATIVE Final    Comment: RESULT CALLED TO, READ BACK BY AND VERIFIED WITH: ZULETA,C AT 1600 ON 11/03/2018 BY JPM (NOTE) If result is NEGATIVE SARS-CoV-2 target nucleic acids are NOT DETECTED. The SARS-CoV-2 RNA is generally detectable in upper and lower  respiratory specimens during the acute phase of infection. The lowest  concentration of SARS-CoV-2 viral copies this assay can detect is 250  copies / mL. A negative result does not preclude SARS-CoV-2 infection  and should not be used as the sole basis for treatment or other  patient management decisions.  A negative result may occur with  improper specimen collection / handling, submission of specimen other  than nasopharyngeal swab, presence of viral mutation(s) within the  areas targeted by this assay, and inadequate number of viral copies  (<250 copies / mL). A negative result must be combined with clinical  observations, patient history, and epidemiological information. If result is POSITIVE SARS-CoV-2 target nucleic acids are DETECTED. T he SARS-CoV-2 RNA is generally detectable in upper and lower  respiratory specimens during the acute phase of infection.  Positive  results are indicative of active infection  with SARS-CoV-2.  Clinical  correlation with patient history and other diagnostic information is  necessary to determine patient infection status.  Positive results do  not rule out bacterial infection or co-infection with other viruses. If result is PRESUMPTIVE POSTIVE SARS-CoV-2 nucleic acids MAY BE PRESENT.   A presumptive positive result was obtained on the submitted specimen  and confirmed on repeat testing.  While 2019 novel coronavirus  (SARS-CoV-2) nucleic acids may be present in the submitted sample  additional confirmatory testing may be necessary for epidemiological  and / or clinical management purposes  to differentiate between  SARS-CoV-2 and other Sarbecovirus currently known to infect humans.  If clinically indicated additional testing with an alternate test  methodology [phone]) is  advised. The SARS-CoV-2 RNA is generally  detectable in upper and lower respiratory specimens during the acute  phase of infection. The expected result is Negative. Fact Sheet for Patients:  StrictlyIdeas.no Fact Sheet for Healthcare Providers: BankingDealers.co.za This test is not yet approved or cleared by the [place] FDA and has been authorized for detection and/or diagnosis of SARS-CoV-2 by FDA under an Emergency Use Authorization (EUA).  This EUA will remain in effect (meaning this test can be used) for the duration of the COVID-19 declaration under Section 564(b)(1) of the Act, 21 U.S.C. section 360bbb-3(b)(1), unless the authorization is terminated or revoked sooner. Performed at [facility], [place] [address], [place], [place] [address]       Radiology Studies: Dg Chest [place] 1  View  Result Date: 11/13/2018 CLINICAL DATA:  Acute respiratory failure with hypoxia. EXAM: PORTABLE CHEST 1 VIEW COMPARISON:  11/11/2018 FINDINGS: Left IJ central venous catheter unchanged with tip obliquely oriented over the junction  of the brachiocephalic vein to SVC. Patient is rotated to the left. Lungs are hypoinflated demonstrate interval worsening of hazy central airspace opacification right worse than left. No definite effusion. Mild stable cardiomegaly. Remainder of the exam is unchanged. IMPRESSION: Interval worsening bilateral hazy central airspace opacification right worse than left likely asymmetric interstitial edema and less likely infection. Stable cardiomegaly.  Left IJ central venous catheter unchanged. Electronically Signed   By: Marin Olp M.D.   On: 11/13/2018 08:59       LOS: 8 days   Phillips Climes MD  [facility] Pager on [email or web address]  11/13/2018, 10:50 AM

## 2018-11-13 NOTE — Progress Notes (Signed)
Hypoglycemic Event  CBG: 51 @ 0738  Treatment: D50 50 mL (25 gm) as pt was still sleepy  Symptoms: None  Follow-up CBG: [id number]; 1518 CBG Result: 64; 86  Possible Reasons for Event: Unknown  Comments/MD notified:    Newman Nickels

## 2018-11-13 NOTE — Progress Notes (Signed)
OT Treatment Note  Pt making steady progress. Seen on 10L HFNC. Pt able to stand x 3 and walk @ 5 steps forward/backward using RW. SpO2 desat to 88 with HR @ 144 and RR upper 20s. Educated on [employer].  Pt complains of "not being able to get my breath" when I move. Pt with improved RR with use of pursed lip breathing. Bloody discharge from nose - nsg made aware. Pt set up at end of session with IPAD movie to help with relaxation.  Pt appreciative. Will continue to follow.     11/13/18 1710  OT Visit Information  Last OT Received On 11/13/18  Assistance Needed +2  PT/OT/SLP Co-Evaluation/Treatment Yes  Reason for Co-Treatment Complexity of the patient's impairments (multi-system involvement);To address functional/ADL transfers  OT goals addressed during session ADL's and self-care;Strengthening/ROM  History of Present Illness KIMONI PAGLIARULO is a 78 y.o. male with medical history significant of PAD, DM, HTN, BPH, OSA who presents due to SOB and nonproductive cough. Positive for Covid .  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 Reported multiple falls.  Precautions  Precautions Fall  Precaution Comments right LE is much weaker than left and may buckle  Pain Assessment  Pain Assessment No/denies pain  Cognition  Arousal/Alertness Awake/alert  Behavior During Therapy WFL for tasks assessed/performed;Flat affect  Overall Cognitive Status Within Functional Limits for tasks assessed  General Comments appears weary, coughing frequently.  Upper Extremity Assessment  Upper Extremity Assessment Generalized weakness  Lower Extremity Assessment  Lower Extremity Assessment Defer to PT evaluation  ADL  Overall ADL's  Needs assistance/impaired  Lower Body Dressing Moderate assistance;Sit to/from stand  Lower Body Dressing Details (indicate cue type and reason) unable to attempt to donn socks  due to respiratory status  Functional mobility during ADLs Minimal assistance;+2 for safety/equipment;Rolling walker  Bed Mobility  General bed mobility comments In recliner upon arrival  Balance  Overall balance assessment Needs assistance  Sitting balance-Leahy Scale Good  Standing balance-Leahy Scale Poor  Transfers  Overall transfer level Needs assistance  Equipment used Rolling walker (2 wheeled)  Transfers Sit to/from Stand  Sit to Stand Min assist;+2 safety/equipment  General transfer comment Min assist from low recliner, drecreased descent control. cues for hand placement  Exercises  Exercises General Upper Extremity  General Exercises - Lower Extremity  Ankle Circles/Pumps AROM;Both;5 reps;Seated  Long Arc Quad AROM;Both;5 reps;Seated  Hip Flexion/Marching AROM;Both;5 reps;Seated  Other Exercises  Other Exercises Educated on use of theraband for BUE strengthening; handout left with pt; will need to review  OT - End of Session  Equipment Utilized During Treatment Oxygen (10L)  Activity Tolerance Patient tolerated treatment well  Patient left in chair;with call bell/phone within reach;with SCD's reapplied  Nurse Communication Mobility status  OT Assessment/Plan  OT Plan Discharge plan remains appropriate  OT Visit Diagnosis Unsteadiness on feet (R26.81);Other abnormalities of gait and mobility (R26.89);Muscle weakness (generalized) (M62.81)  OT Frequency (ACUTE ONLY) Min 3X/week  Recommendations for Other Services PT consult  Follow Up Recommendations Home health OT;Supervision/Assistance - 24 hour  OT Equipment 3 in 1 bedside commode;Tub/shower seat  AM-PAC OT "6 Clicks" Daily Activity Outcome Measure (Version 2)  Help from another person eating meals? 4  Help from another person taking care of personal grooming? 3  Help from another person toileting, which includes using toliet, bedpan, or urinal? 2  Help from another person bathing (including washing, rinsing,  drying)? 2  Help from another person to put on and taking off regular upper body clothing? 3  Help from another person to put on and taking off regular lower body clothing? 2  6 Click Score 16  OT Goal Progression  Progress towards OT goals Progressing toward goals  Acute Rehab OT Goals  Patient Stated Goal to feel better, home soon  OT Goal Formulation With patient  Time For Goal Achievement 11/18/18  Potential to Achieve Goals Good  ADL Goals  Pt Will Perform Grooming with min guard assist;standing  Pt Will Perform Lower Body Dressing with min guard assist;with adaptive equipment;sit to/from stand  Pt Will Transfer to Toilet with min guard assist;bedside commode;ambulating  Pt Will Perform Toileting - Clothing Manipulation and hygiene with min guard assist;sit to/from stand;sitting/lateral leans  Pt Will Perform Tub/Shower Transfer with min assist;shower seat;ambulating;Tub transfer  OT Time Calculation  OT Start Time (ACUTE ONLY) 1455  OT Stop Time (ACUTE ONLY) 1525  OT Time Calculation (min) 30 min  OT General Charges  $OT Visit 1 Visit  OT Treatments  $Therapeutic Activity 8-22 mins  Maurie Boettcher, OT/L   Acute OT Clinical Specialist [place] Pager [phone] Office [phone]

## 2018-11-14 LAB — COMPREHENSIVE METABOLIC PANEL
ALT: 91 U/L — ABNORMAL HIGH (ref 0–44)
AST: 42 U/L — ABNORMAL HIGH (ref 15–41)
Albumin: 3.3 g/dL — ABNORMAL LOW (ref 3.5–5.0)
Alkaline Phosphatase: 102 U/L (ref 38–126)
Anion gap: 10 (ref 5–15)
BUN: 48 mg/dL — ABNORMAL HIGH (ref 8–23)
CO2: 24 mmol/L (ref 22–32)
Calcium: 8.9 mg/dL (ref 8.9–10.3)
Chloride: 101 mmol/L (ref 98–111)
Creatinine, Ser: 1.58 mg/dL — ABNORMAL HIGH (ref 0.61–1.24)
GFR calc Af Amer: 49 mL/min — ABNORMAL LOW (ref >60)
GFR calc non Af Amer: 42 mL/min — ABNORMAL LOW (ref >60)
Glucose, Bld: 94 mg/dL (ref 70–99)
Potassium: 4.4 mmol/L (ref 3.5–5.1)
Sodium: 135 mmol/L (ref 135–145)
Total Bilirubin: 0.6 mg/dL (ref 0.3–1.2)
Total Protein: 6.2 g/dL — ABNORMAL LOW (ref 6.5–8.1)

## 2018-11-14 LAB — GLUCOSE, CAPILLARY
Glucose-Capillary: 59 mg/dL — ABNORMAL LOW (ref 70–99)
Glucose-Capillary: 85 mg/dL (ref 70–99)
Glucose-Capillary: 133 mg/dL — ABNORMAL HIGH (ref 70–99)
Glucose-Capillary: 319 mg/dL — ABNORMAL HIGH (ref 70–99)
Glucose-Capillary: 388 mg/dL — ABNORMAL HIGH (ref 70–99)
Glucose-Capillary: 114 mg/dL — ABNORMAL HIGH (ref 70–99)

## 2018-11-14 LAB — CBC WITH DIFFERENTIAL/PLATELET
Abs Immature Granulocytes: 1.34 10*3/uL — ABNORMAL HIGH (ref 0.00–0.07)
Basophils Absolute: 0.1 10*3/uL (ref 0.0–0.1)
Basophils Relative: 1 %
Eosinophils Absolute: 0.1 10*3/uL (ref 0.0–0.5)
Eosinophils Relative: 1 %
HCT: 49.2 % (ref 39.0–52.0)
Hemoglobin: 16.4 g/dL (ref 13.0–17.0)
Immature Granulocytes: 7 %
Lymphocytes Relative: 11 %
Lymphs Abs: 2.2 10*3/uL (ref 0.7–4.0)
MCH: 30.1 pg (ref 26.0–34.0)
MCHC: 33.3 g/dL (ref 30.0–36.0)
MCV: 90.3 fL (ref 80.0–100.0)
Monocytes Absolute: 1.8 10*3/uL — ABNORMAL HIGH (ref 0.1–1.0)
Monocytes Relative: 9 %
Neutro Abs: 15.1 10*3/uL — ABNORMAL HIGH (ref 1.7–7.7)
Neutrophils Relative %: 71 %
Platelets: 343 10*3/uL (ref 150–400)
RBC: 5.45 MIL/uL (ref 4.22–5.81)
RDW: 13.7 % (ref 11.5–15.5)
WBC: 20.7 10*3/uL — ABNORMAL HIGH (ref 4.0–10.5)
nRBC: 0 % (ref 0.0–0.2)

## 2018-11-14 LAB — D-DIMER, QUANTITATIVE: D-Dimer, Quant: 2.08 ug/mL-FEU — ABNORMAL HIGH (ref 0.00–0.50)

## 2018-11-14 LAB — C-REACTIVE PROTEIN: CRP: 1.3 mg/dL — ABNORMAL HIGH (ref <1.0)

## 2018-11-14 MED ORDER — INSULIN ASPART PROT & ASPART (70-30 MIX) 100 UNIT/ML ~~LOC~~ SUSP
20.0000 [IU] | Freq: Two times a day (BID) | SUBCUTANEOUS | Status: DC
  Administered 2018-11-14 – 2018-11-21 (×15): 20 [IU] via SUBCUTANEOUS
  Filled 2018-11-14: qty 10

## 2018-11-14 MED ORDER — FUROSEMIDE 10 MG/ML IJ SOLN
40.0000 mg | Freq: Once | INTRAMUSCULAR | Status: AC
  Administered 2018-11-14: 40 mg via INTRAVENOUS
  Filled 2018-11-14: qty 4

## 2018-11-14 MED ORDER — SENNOSIDES-DOCUSATE SODIUM 8.6-50 MG PO TABS
2.0000 | ORAL_TABLET | Freq: Two times a day (BID) | ORAL | Status: DC
  Administered 2018-11-14 – 2018-12-01 (×28): 2 via ORAL
  Filled 2018-11-14 (×33): qty 2

## 2018-11-14 MED ORDER — INSULIN ASPART 100 UNIT/ML ~~LOC~~ SOLN
0.0000 [IU] | Freq: Three times a day (TID) | SUBCUTANEOUS | Status: DC
  Administered 2018-11-14: 20 [IU] via SUBCUTANEOUS
  Administered 2018-11-15: 7 [IU] via SUBCUTANEOUS
  Administered 2018-11-15: 11 [IU] via SUBCUTANEOUS
  Administered 2018-11-16: 7 [IU] via SUBCUTANEOUS
  Administered 2018-11-16: 20 [IU] via SUBCUTANEOUS
  Administered 2018-11-16: 4 [IU] via SUBCUTANEOUS
  Administered 2018-11-17: 20 [IU] via SUBCUTANEOUS
  Administered 2018-11-17: 7 [IU] via SUBCUTANEOUS
  Administered 2018-11-18: 15 [IU] via SUBCUTANEOUS
  Administered 2018-11-18: 7 [IU] via SUBCUTANEOUS
  Administered 2018-11-19: 11 [IU] via SUBCUTANEOUS
  Administered 2018-11-19: 4 [IU] via SUBCUTANEOUS
  Administered 2018-11-20: 15 [IU] via SUBCUTANEOUS
  Administered 2018-11-20 – 2018-11-21 (×2): 4 [IU] via SUBCUTANEOUS
  Administered 2018-11-21: 11 [IU] via SUBCUTANEOUS
  Administered 2018-11-21: 15 [IU] via SUBCUTANEOUS
  Administered 2018-11-22: 20 [IU] via SUBCUTANEOUS
  Administered 2018-11-22: 15 [IU] via SUBCUTANEOUS
  Administered 2018-11-23: 7 [IU] via SUBCUTANEOUS
  Administered 2018-11-23: 15 [IU] via SUBCUTANEOUS
  Administered 2018-11-23: 11 [IU] via SUBCUTANEOUS
  Administered 2018-11-24: 10 [IU] via SUBCUTANEOUS
  Administered 2018-11-24: 7 [IU] via SUBCUTANEOUS
  Administered 2018-11-25 (×2): 11 [IU] via SUBCUTANEOUS
  Administered 2018-11-25: 7 [IU] via SUBCUTANEOUS
  Administered 2018-11-26: 15 [IU] via SUBCUTANEOUS
  Administered 2018-11-26: 20 [IU] via SUBCUTANEOUS
  Administered 2018-11-26: 7 [IU] via SUBCUTANEOUS
  Administered 2018-11-27 (×2): 20 [IU] via SUBCUTANEOUS
  Administered 2018-11-27: 3 [IU] via SUBCUTANEOUS
  Administered 2018-11-28: 7 [IU] via SUBCUTANEOUS
  Administered 2018-11-28: 18:00:00 11 [IU] via SUBCUTANEOUS
  Administered 2018-11-28: 3 [IU] via SUBCUTANEOUS
  Administered 2018-11-29 (×2): 7 [IU] via SUBCUTANEOUS
  Administered 2018-11-29: 3 [IU] via SUBCUTANEOUS
  Administered 2018-11-30: 4 [IU] via SUBCUTANEOUS
  Administered 2018-11-30 (×2): 7 [IU] via SUBCUTANEOUS
  Administered 2018-12-01: 4 [IU] via SUBCUTANEOUS

## 2018-11-14 NOTE — TOC Progression Note (Signed)
Transition of Care [facility]) - Progression Note    Patient Details  Name: CHAMPION CORALES MRN: [id number] Date of Birth: 1941-04-14  Transition of Care [facility]) CM/SW [place] RN, BSN, NCM-BC, ACM-RN [phone] (working remotely) Phone Number: 11/14/2018, 2:10 PM  Clinical Narrative:    CM following for transitional needs. Patient progressing well, tolerating 8 L HFNC today with progressive care orders. HHPT/OT recommended/BSC. [facility] was previously selected for DME needs; [facility] for [facility] needs (per previous CM note). CM team will continue to follow for arrangements of services and dispositional needs.    Expected Discharge Plan: [place] Barriers to Discharge: Continued Medical Work up  Expected Discharge Plan and Services Expected Discharge Plan: [place]   Discharge Planning Services: CM Consult Post Acute Care Choice: Durable Medical Equipment   Expected Discharge Date: TBD                DME Arranged: Bedside commode DME Agency: [facility] Date DME Agency Contacted: 11/07/18 Time DME Agency Contacted: 1100 Representative spoke with at DME Agency: Learta Codding HH Arranged: PT, OT [place] Agency: [facility]         Social Determinants of Health (SDOH) Interventions    Readmission Risk Interventions No flowsheet data found.

## 2018-11-14 NOTE — Progress Notes (Addendum)
Pts blood sugar 47 and 53 on second check.  Juice and oatmeal cookie given. Blood sugar 59, and more juice was given.  Blood sugar is now 85.

## 2018-11-14 NOTE — Progress Notes (Signed)
Patient continues to improve per [facility] MD - will sign off    SIGNATURE    Dr. Brand Males, M.D., F.C.C.P,  Pulmonary and Critical Care Medicine Staff Physician, [place] Director - Interstitial Lung Disease  Program  Pulmonary [place] at [place], [place], [address]  Pager: [phone], If no answer or between  15:00h - 7:00h: call [phone] Telephone: [phone]  11:02 AM 11/14/2018

## 2018-11-14 NOTE — Progress Notes (Addendum)
PROGRESS NOTE  Andrew Larsen ZMO:[id number] DOB: 1940-07-19 DOA: 11/03/2018  PCP: Velna Hatchet, MD  Brief History/Interval Summary:   78 y.o.BM PMHx  T2DM, PAD, HTN, OSA, and BPH who presented with dry cough and shortness of breath with positive covid exposures. He tested positive for covid and had bilateral CXR infiltrates, admitted to [facility] and developed hypoxia.  He was initially admitted to the progressive care unit.  However his respiratory status continued to get worse and he was transferred to the intensive care unit, given his requirement for heated high flow nasal cannula, he was weaned gradually, he is on 8 L high flow nasal cannula today, he will be transferred to progressive care.   Subjective/Interval History:  No significant events overnight, reports he had a good bowel movement, good night sleep, appetite has improved..   Assessment/Plan:  Acute Hypoxemic Resp. Failure due to Acute Covid 19 Viral Illness  COVID-19 Labs  Recent Labs    11/12/18 0105 11/13/18 0500 11/14/18 0500  DDIMER 2.15* 1.87* 2.08*  FERRITIN 600*  --   --   CRP 1.1* 0.8 1.3*    Lab Results  Component Value Date   SARSCOV2NAA POSITIVE (A) 11/03/2018     Fever:   T-max was 102.9 on 7/7. Oxygen requirements: Difficult improvement of oxygen requirement, yesterday he was on 35 L flow with 60% FiO2, today he is saturating 98% on 8 L high flow nasal cannula . Antibiotics: No antibacterials Remdesivir: x 5 days Steroids: Solu-Medrol  Diuretics: Currently on IV Lasix, dosing on as needed, likely will need to be continued for few days given evidence of volume overload. Actemra: Given on 7/10 and 7/11 Convalescent Plasma: Given on 7/11  Vitamin C and Zinc: Continue DVT Prophylaxis: Lovenox 60  mg subcu every 12 hours  - Patient with improvement of oxygen requirement, but he remains on 8 L high flow nasal cannula, he had significant improvement over last 24-hour most likely in the setting of  IV diuresis , I will give another dose of 40 mg of Lasix once today versus x2 doses yesterday especially in the setting of soft blood pressure . - He was encouraged to use incentive spirometry, unable to bring him, but he was encouraged at least to sleep on his side, he received Actemra, Remdesivir, plasma, he remains on steroids, continue to trend inflammatory markers.  The treatment plan and use of medications and known side effects were discussed with patient.  Some of the medications used are based on case reports/anecdotal data which are not peer-reviewed and has not been studied using randomized control trials.  Complete risks and long-term side effects are unknown, however in the best clinical judgment they seem to be of some benefit.  Patient agrees with the treatment plan and want to receive these treatments as indicated.  Patient was given steroids and Actemra.  Abnormal chest x-ray An area of concern noted in the left lung.  Concern for pneumatocele.  This appears to be resolved on following imaging .  mild transaminitis Secondary to COVID-19.  Stable.  Trending down   Diabetes mellitus type 2 in the setting of obesity CBG labile, but low this morning at 51, I will decrease his evening Lantus from 40 to 20 units . Continue Lantus and SSI.  HbA1c 7.3.   Chronic kidney disease stage III -  Appears to be at baseline, creatinine is 1.5, continue to monitor closely as on IV diuresis - Continue to monitor urine output closely, try to  keep negative fluid balance - His weight has been stable.  Questionable history of congestive heart failure No echocardiogram reports available on epic.  Patient was on ARB and diuretics at home.  These could have been prescribed due to his CKD.  Will not do an echocardiogram at this time it is not clearly indicated.  Continue to monitor volume status.  This can be further pursued in the outpatient setting.  History of coronary artery disease Stable.  No  chest pain.  Continue home medications.  Essential hypertension Elevated most likely due to steroids.  Hydralazine as needed.  Holding diuretic and his ARB.  Amlodipine was initiated yesterday.  Will adjust tomorrow if blood pressure not optimally controlled.    History of peripheral arterial disease Continue cilostazol.  History of obstructive sleep apnea Cannot utilize CPAP at this time, he is on heated high flow nasal cannula(heated high flow nasal cannula should be continued at nighttime only as replacement of CPAP)  History of diabetic neuropathy Continue gabapentin.  Morbid obesity BMI 36.65.  Recently diagnosed inclusion body myositis That would explain the elevated CK level.  Apparently followed at [facility] by a neurologist, Andrew Larsen ([phone]).  CK level had shown improvement.   right IJ TLC, will see if appropriate to discontinue today if able to get reliable peripheral access  DVT Prophylaxis: Subcutaneous heparin PUD Prophylaxis: Continue Pepcid Code Status: Full code Family Communication: Discussed with the patient.  Discussing with his daughter on a daily basis.   Disposition Plan: Patient is improving, he will be transferred to progressive care.   Reason for Visit: Acute respiratory disease due to COVID-19  Consultants: Pulmonology  Procedures: Central line placement into the left internal jugular 7/10  Antibiotics: Anti-infectives (From admission, onward)   Start     Dose/Rate Route Frequency Ordered Stop   11/08/18 1330  remdesivir 100 mg in sodium chloride 0.9 % 250 mL IVPB     100 mg 500 mL/hr over 30 Minutes Intravenous Every 24 hours 11/07/18 1229 11/11/18 1304   11/07/18 1330  remdesivir 200 mg in sodium chloride 0.9 % 250 mL IVPB     200 mg 500 mL/hr over 30 Minutes Intravenous Once 11/07/18 1229 11/08/18 1529      Medications:  Scheduled: . sodium chloride   Intravenous Once  . amLODipine  10 mg Oral Daily  . aspirin  81 mg Oral QHS   . bisacodyl  10 mg Oral Once  . brimonidine  1 drop Both Eyes BID  . Chlorhexidine Gluconate Cloth  6 each Topical Daily  . cilostazol  100 mg Oral BID  . enoxaparin (LOVENOX) injection  60 mg Subcutaneous Q12H  . famotidine  20 mg Oral QHS  . folic acid  1 mg Oral Daily  . gabapentin  600 mg Oral TID  . insulin aspart  0-20 Units Subcutaneous Q4H  . insulin aspart protamine- aspart  25 Units Subcutaneous BID WC  . Ipratropium-Albuterol  2 puff Inhalation QID  . latanoprost  1 drop Both Eyes QHS  . mouth rinse  15 mL Mouth Rinse BID  . methylPREDNISolone (SOLU-MEDROL) injection  40 mg Intravenous Daily  . oxybutynin  5 mg Oral BID  . polyethylene glycol  17 g Oral BID  . polyethylene glycol  17 g Oral Once  . sodium chloride flush  3 mL Intravenous Q12H  . tamsulosin  0.4 mg Oral Daily  . thiamine  100 mg Oral Daily  . timolol  1 drop Both  Eyes BID  . vitamin C  500 mg Oral Daily  . zinc sulfate  220 mg Oral Daily   Continuous:  GNF:AOZHYQMVHQION, benzonatate, chlorpheniramine-HYDROcodone, guaiFENesin-dextromethorphan, hydrALAZINE, ondansetron **OR** ondansetron (ZOFRAN) IV, senna-docusate, sodium chloride   Objective:  Vital Signs  Vitals:   11/14/18 0500 11/14/18 0600 11/14/18 0714 11/14/18 0750  BP: 108/88 124/74 138/80 107/64  Pulse:   (!) 103 (!) 115  Resp: 18 18 15    Temp:    97.6 F (36.4 C)  TempSrc:    Oral  SpO2: 96%  97% 92%  Weight:      Height:        Intake/Output Summary (Last 24 hours) at 11/14/2018 1151 Last data filed at 11/14/2018 1053 Gross per 24 hour  Intake 1200 ml  Output 3150 ml  Net -1950 ml   Filed Weights   11/10/18 0440 11/12/18 0500 11/13/18 0500  Weight: 119.8 kg 118.6 kg 118.2 kg    Awake Alert, Oriented X 3, No new F.N deficits, Normal affect Symmetrical Chest wall movement, Good air movement bilaterally, CTAB RRR,No Gallops,Rubs or new Murmurs, No Parasternal Heave +ve B.Sounds, Abd Soft, No tenderness, No rebound -  guarding or rigidity. No Cyanosis, Clubbing or edema, No new Rash or bruise       Lab Results:  Data Reviewed: I have personally reviewed following labs and imaging studies  CBC: Recent Labs  Lab 11/10/18 0505 11/11/18 0510 11/12/18 0105 11/13/18 0500 11/14/18 0500  WBC 14.3* 19.5* 19.5* 17.4* 20.7*  NEUTROABS 11.2* 15.5* 15.1* 13.2* 15.1*  HGB 14.7 15.3 15.9 15.9 16.4  HCT 43.9 46.4 47.0 48.4 49.2  MCV 91.3 91.2 90.2 91.1 90.3  PLT 326 411* 377 353 629    Basic Metabolic Panel: Recent Labs  Lab 11/08/18 0500 11/09/18 0449 11/10/18 0505 11/11/18 0510 11/12/18 0105 11/13/18 0500 11/14/18 0500  NA 134* 137 138 139 137 137 135  K 4.1 3.9 4.2 3.7 4.6 4.5 4.4  CL 98 100 104 106 105 105 101  CO2 25 26 23 23 22 22 24   GLUCOSE 164* 175* 203* 124* 222* 87 94  BUN 47* 42* 40* 36* 43* 42* 48*  CREATININE 1.62* 1.44* 1.40* 1.24 1.51* 1.44* 1.58*  CALCIUM 8.5* 8.5* 8.4* 8.3* 8.5* 8.7* 8.9  MG 2.3 2.5* 2.5* 2.6* 2.5*  --   --   PHOS 3.1 2.7 2.4*  --   --   --   --     GFR: Estimated Creatinine Clearance: 52 mL/min (A) (by C-G formula based on SCr of 1.58 mg/dL (H)).  Liver Function Tests: Recent Labs  Lab 11/10/18 0505 11/11/18 0510 11/12/18 0105 11/13/18 0500 11/14/18 0500  AST 54* 55* 64* 57* 42*  ALT 89* 88* 111* 113* 91*  ALKPHOS 70 91 93 95 102  BILITOT 0.4 0.6 0.3 0.5 0.6  PROT 6.5 6.5 6.4* 6.2* 6.2*  ALBUMIN 3.1* 3.2* 3.1* 3.3* 3.3*     Cardiac Enzymes: Recent Labs  Lab 11/08/18 0500 11/09/18 0449  CKTOTAL 777* 422*    HbA1C: No results for input(s): HGBA1C in the last 72 hours.  CBG: Recent Labs  Lab 11/13/18 1607 11/13/18 1957 11/13/18 2324 11/14/18 0357 11/14/18 0417  GLUCAP 502* 359* 169* 59* 85    Lipid Profile: No results for input(s): CHOL, HDL, LDLCALC, TRIG, CHOLHDL, LDLDIRECT in the last 72 hours.  Anemia Panel: Recent Labs    11/12/18 0105  FERRITIN 600*    No results found for this or any previous visit (from  the  past 240 hour(s)).    Radiology Studies: Dg Chest Port 1 View  Result Date: 11/13/2018 CLINICAL DATA:  Acute respiratory failure with hypoxia. EXAM: PORTABLE CHEST 1 VIEW COMPARISON:  11/11/2018 FINDINGS: Left IJ central venous catheter unchanged with tip obliquely oriented over the junction of the brachiocephalic vein to SVC. Patient is rotated to the left. Lungs are hypoinflated demonstrate interval worsening of hazy central airspace opacification right worse than left. No definite effusion. Mild stable cardiomegaly. Remainder of the exam is unchanged. IMPRESSION: Interval worsening bilateral hazy central airspace opacification right worse than left likely asymmetric interstitial edema and less likely infection. Stable cardiomegaly.  Left IJ central venous catheter unchanged. Electronically Signed   By: Marin Olp M.D.   On: 11/13/2018 08:59       LOS: 9 days   Phillips Climes MD  [facility] Pager on [email or web address]  11/14/2018, 11:51 AM

## 2018-11-15 LAB — GLUCOSE, CAPILLARY
Glucose-Capillary: 81 mg/dL (ref 70–99)
Glucose-Capillary: 60 mg/dL — ABNORMAL LOW (ref 70–99)
Glucose-Capillary: 70 mg/dL (ref 70–99)
Glucose-Capillary: 100 mg/dL — ABNORMAL HIGH (ref 70–99)
Glucose-Capillary: 208 mg/dL — ABNORMAL HIGH (ref 70–99)
Glucose-Capillary: 281 mg/dL — ABNORMAL HIGH (ref 70–99)
Glucose-Capillary: 251 mg/dL — ABNORMAL HIGH (ref 70–99)

## 2018-11-15 LAB — CBC WITH DIFFERENTIAL/PLATELET
Abs Immature Granulocytes: 1.36 10*3/uL — ABNORMAL HIGH (ref 0.00–0.07)
Basophils Absolute: 0.1 10*3/uL (ref 0.0–0.1)
Basophils Relative: 0 %
Eosinophils Absolute: 0 10*3/uL (ref 0.0–0.5)
Eosinophils Relative: 0 %
HCT: 49.3 % (ref 39.0–52.0)
Hemoglobin: 16.7 g/dL (ref 13.0–17.0)
Immature Granulocytes: 6 %
Lymphocytes Relative: 8 %
Lymphs Abs: 1.9 10*3/uL (ref 0.7–4.0)
MCH: 30 pg (ref 26.0–34.0)
MCHC: 33.9 g/dL (ref 30.0–36.0)
MCV: 88.5 fL (ref 80.0–100.0)
Monocytes Absolute: 1.8 10*3/uL — ABNORMAL HIGH (ref 0.1–1.0)
Monocytes Relative: 7 %
Neutro Abs: 19.1 10*3/uL — ABNORMAL HIGH (ref 1.7–7.7)
Neutrophils Relative %: 79 %
Platelets: 355 10*3/uL (ref 150–400)
RBC: 5.57 MIL/uL (ref 4.22–5.81)
RDW: 13.5 % (ref 11.5–15.5)
WBC: 24.3 10*3/uL — ABNORMAL HIGH (ref 4.0–10.5)
nRBC: 0 % (ref 0.0–0.2)

## 2018-11-15 LAB — COMPREHENSIVE METABOLIC PANEL
ALT: 79 U/L — ABNORMAL HIGH (ref 0–44)
AST: 36 U/L (ref 15–41)
Albumin: 3.2 g/dL — ABNORMAL LOW (ref 3.5–5.0)
Alkaline Phosphatase: 106 U/L (ref 38–126)
Anion gap: 13 (ref 5–15)
BUN: 50 mg/dL — ABNORMAL HIGH (ref 8–23)
CO2: 21 mmol/L — ABNORMAL LOW (ref 22–32)
Calcium: 8.8 mg/dL — ABNORMAL LOW (ref 8.9–10.3)
Chloride: 98 mmol/L (ref 98–111)
Creatinine, Ser: 1.64 mg/dL — ABNORMAL HIGH (ref 0.61–1.24)
GFR calc Af Amer: 46 mL/min — ABNORMAL LOW (ref >60)
GFR calc non Af Amer: 40 mL/min — ABNORMAL LOW (ref >60)
Glucose, Bld: 74 mg/dL (ref 70–99)
Potassium: 4.4 mmol/L (ref 3.5–5.1)
Sodium: 132 mmol/L — ABNORMAL LOW (ref 135–145)
Total Bilirubin: 0.5 mg/dL (ref 0.3–1.2)
Total Protein: 6.2 g/dL — ABNORMAL LOW (ref 6.5–8.1)

## 2018-11-15 LAB — C-REACTIVE PROTEIN: CRP: <0.8 mg/dL (ref <1.0)

## 2018-11-15 LAB — D-DIMER, QUANTITATIVE: D-Dimer, Quant: 2.08 ug/mL-FEU — ABNORMAL HIGH (ref 0.00–0.50)

## 2018-11-15 NOTE — Progress Notes (Addendum)
PROGRESS NOTE  Andrew Larsen SJG:[id number] DOB: [birth year] DOA: 11/03/2018  PCP: Velna Hatchet, MD  Brief History/Interval Summary:   78 y.o.BM PMHx  T2DM, PAD, HTN, OSA, and BPH who presented with dry cough and shortness of breath with positive covid exposures. He tested positive for covid and had bilateral CXR infiltrates, admitted to [facility] and developed hypoxia.  He was initially admitted to the progressive care unit.  However his respiratory status continued to get worse and he was transferred to the intensive care unit, given his requirement for heated high flow nasal cannula, he was weaned gradually, he is on 8 L high flow nasal cannula today, he will be transferred to progressive care.   Subjective/Interval History:  No significant events overnight, reports he had a good bowel movement, good night sleep, appetite has improved..   Assessment/Plan:  Acute Hypoxemic Resp. Failure due to Acute Covid 19 Viral Illness ? Concurrent heart failure exacerbation Previously Heated high flow dependent on upwards of 80% FiO2 Improving - on 7L HFNC overnight - weaned to 2L at rest this morning with sats around 93% Antibiotics: No antibacterials Remdesivir: x 5 days - completed 7/14 Steroids: Solu-Medrol ongoing - continue to wean as tolerated Diuretics: Currently on IV Lasix, dosing on as needed, likely will need to be continued for few days given evidence of volume overload. Actemra: Given on 7/10 and 7/11 Convalescent Plasma: Given on 7/11  Vitamin C and Zinc: Continue DVT Prophylaxis: Lovenox 60  mg subcu every 12 hours  The treatment plan and use of medications and known side effects were discussed with patient.  Some of the medications used are based on case reports/anecdotal data which are not peer-reviewed and has not been studied using randomized control trials.  Complete risks and long-term side effects are unknown, however in the best clinical judgment they seem to be of some  benefit.  Patient agrees with the treatment plan and want to receive these treatments as indicated.  Patient was given steroids, plasma, and Actemra.  Recent Labs    11/13/18 0500 11/14/18 0500 11/15/18 0120  DDIMER 1.87* 2.08* 2.08*  CRP 0.8 1.3* <0.8   Lab Results  Component Value Date   SARSCOV2NAA POSITIVE (A) 11/03/2018    Abnormal chest x-ray/CT findings An area of concern noted in the left lung.  Concern for pneumatocele.  This appears to be resolved on following imaging .  Congestive heart failure- diastolic dysfunction EF 65% (stress echo Jan/2018) Questionably in acute exacerbation, resolving No formal echocardiogram reports available on epic - stress echo in distant records.   Patient was on ARB and diuretics at home.   Hold off on further echo - can certainly be pursued in the outpatient setting with cardiology.  Mild transaminitis, resolving Secondary to COVID-19 vs remdesivir. Down trending.Remains asymptomatic   Diabetes mellitus type 2 in the setting of obesity, poorly controlled CBG quite labile while on steroids which are now being weaned Hypoglycemia previously likely due to steroid wean -  Lantus currently down to 20 units . Continue Lantus and SSI.  HbA1c 7.3.   Chronic kidney disease stage III, stable -  Appears to be at baseline, creatinine is 1.5, continue to monitor closely as on IV diuresis - Continue to monitor urine output closely, try to keep negative fluid balance - His weight has been stable.  History of coronary artery disease Stable.  No chest pain.  Continue home medications.  Essential hypertension Elevated most likely due to steroids.  Hydralazine as  needed.  Holding diuretic and his ARB.  Amlodipine was initiated yesterday.  Will adjust tomorrow if blood pressure not optimally controlled.    History of peripheral arterial disease Continue cilostazol.  History of obstructive sleep apnea Cannot utilize CPAP at this time, he is on  heated high flow nasal cannula(heated high flow nasal cannula should be continued at nighttime only as replacement of CPAP)  History of diabetic neuropathy Continue gabapentin.  Morbid obesity BMI 36.65.  Recently diagnosed inclusion body myositis That would explain the elevated CK level.  Apparently followed at [facility] by a neurologist, Dr. Warnell Forester ([phone]).  CK level had shown improvement.   right IJ TLC, will see if appropriate to discontinue today if able to get reliable peripheral access  DVT Prophylaxis: Subcutaneous heparin PUD Prophylaxis: Continue Pepcid Code Status: Full code Family Communication: Discussed with the patient.  Discussing with his daughter on a daily basis.   Disposition Plan: Patient is improving, he will be transferred to progressive care.   Reason for Visit: Acute respiratory disease due to COVID-19  Consultants: Pulmonology  Procedures: Central line placement into the left internal jugular 7/10  Antibiotics: Anti-infectives (From admission, onward)   Start     Dose/Rate Route Frequency Ordered Stop   11/08/18 1330  remdesivir 100 mg in sodium chloride 0.9 % 250 mL IVPB     100 mg 500 mL/hr over 30 Minutes Intravenous Every 24 hours 11/07/18 1229 11/11/18 1304   11/07/18 1330  remdesivir 200 mg in sodium chloride 0.9 % 250 mL IVPB     200 mg 500 mL/hr over 30 Minutes Intravenous Once 11/07/18 1229 11/08/18 1529      Medications:  Scheduled:  sodium chloride   Intravenous Once   aspirin  81 mg Oral QHS   bisacodyl  10 mg Oral Once   brimonidine  1 drop Both Eyes BID   Chlorhexidine Gluconate Cloth  6 each Topical Daily   cilostazol  100 mg Oral BID   enoxaparin (LOVENOX) injection  60 mg Subcutaneous Q12H   famotidine  20 mg Oral QHS   folic acid  1 mg Oral Daily   gabapentin  600 mg Oral TID   insulin aspart  0-20 Units Subcutaneous TID WC   insulin aspart protamine- aspart  20 Units Subcutaneous BID WC    Ipratropium-Albuterol  2 puff Inhalation QID   latanoprost  1 drop Both Eyes QHS   mouth rinse  15 mL Mouth Rinse BID   methylPREDNISolone (SOLU-MEDROL) injection  40 mg Intravenous Daily   oxybutynin  5 mg Oral BID   polyethylene glycol  17 g Oral BID   polyethylene glycol  17 g Oral Once   senna-docusate  2 tablet Oral BID   sodium chloride flush  3 mL Intravenous Q12H   tamsulosin  0.4 mg Oral Daily   thiamine  100 mg Oral Daily   timolol  1 drop Both Eyes BID   vitamin C  500 mg Oral Daily   zinc sulfate  220 mg Oral Daily   Continuous:  WGN:FAOZHYQMVHQIO, benzonatate, chlorpheniramine-HYDROcodone, guaiFENesin-dextromethorphan, hydrALAZINE, ondansetron **OR** ondansetron (ZOFRAN) IV, senna-docusate, sodium chloride   Objective:  Vital Signs  Vitals:   11/14/18 1600 11/14/18 2100 11/15/18 0420 11/15/18 0750  BP: 117/79 119/60 109/72   Pulse: (!) 130 (!) 102 (!) 105   Resp: 20 19 (!) 21   Temp: (!) 97.3 F (36.3 C) 98.4 F (36.9 C) 97.9 F (36.6 C) (!) 97.5 F (36.4 C)  TempSrc: Oral Oral Axillary Oral  SpO2: 96% (!) 89% 96%   Weight:      Height:        Intake/Output Summary (Last 24 hours) at 11/15/2018 0805 Last data filed at 11/15/2018 0420 Gross per 24 hour  Intake 480 ml  Output 1275 ml  Net -795 ml   Filed Weights   11/10/18 0440 11/12/18 0500 11/13/18 0500  Weight: 119.8 kg 118.6 kg 118.2 kg    Awake Alert, Oriented X 3, No new F.N deficits, Normal affect Symmetrical Chest wall movement, Good air movement bilaterally, CTAB RRR,No Gallops,Rubs or new Murmurs, No Parasternal Heave +ve B.Sounds, Abd Soft, No tenderness, No rebound - guarding or rigidity. No Cyanosis, Clubbing or edema, No new Rash or bruise       Lab Results:  Data Reviewed: I have personally reviewed following labs and imaging studies  CBC: Recent Labs  Lab 11/11/18 0510 11/12/18 0105 11/13/18 0500 11/14/18 0500 11/15/18 0120  WBC 19.5* 19.5* 17.4* 20.7*  24.3*  NEUTROABS 15.5* 15.1* 13.2* 15.1* 19.1*  HGB 15.3 15.9 15.9 16.4 16.7  HCT 46.4 47.0 48.4 49.2 49.3  MCV 91.2 90.2 91.1 90.3 88.5  PLT 411* 377 353 343 287    Basic Metabolic Panel: Recent Labs  Lab 11/09/18 0449 11/10/18 0505 11/11/18 0510 11/12/18 0105 11/13/18 0500 11/14/18 0500 11/15/18 0120  NA 137 138 139 137 137 135 132*  K 3.9 4.2 3.7 4.6 4.5 4.4 4.4  CL 100 104 106 105 105 101 98  CO2 26 23 23 22 22 24  21*  GLUCOSE 175* 203* 124* 222* 87 94 74  BUN 42* 40* 36* 43* 42* 48* 50*  CREATININE 1.44* 1.40* 1.24 1.51* 1.44* 1.58* 1.64*  CALCIUM 8.5* 8.4* 8.3* 8.5* 8.7* 8.9 8.8*  MG 2.5* 2.5* 2.6* 2.5*  --   --   --   PHOS 2.7 2.4*  --   --   --   --   --     GFR: Estimated Creatinine Clearance: 50.1 mL/min (A) (by C-G formula based on SCr of 1.64 mg/dL (H)).  Liver Function Tests: Recent Labs  Lab 11/11/18 0510 11/12/18 0105 11/13/18 0500 11/14/18 0500 11/15/18 0120  AST 55* 64* 57* 42* 36  ALT 88* 111* 113* 91* 79*  ALKPHOS 91 93 95 102 106  BILITOT 0.6 0.3 0.5 0.6 0.5  PROT 6.5 6.4* 6.2* 6.2* 6.2*  ALBUMIN 3.2* 3.1* 3.3* 3.3* 3.2*     Cardiac Enzymes: Recent Labs  Lab 11/09/18 0449  CKTOTAL 422*    HbA1C: No results for input(s): HGBA1C in the last 72 hours.  CBG: Recent Labs  Lab 11/14/18 2121 11/15/18 0126 11/15/18 0531 11/15/18 0633 11/15/18 0748  GLUCAP 114* 81 60* 70 100*    Lipid Profile: No results for input(s): CHOL, HDL, LDLCALC, TRIG, CHOLHDL, LDLDIRECT in the last 72 hours.  Anemia Panel: No results for input(s): VITAMINB12, FOLATE, FERRITIN, TIBC, IRON, RETICCTPCT in the last 72 hours.  No results found for this or any previous visit (from the past 240 hour(s)).    Radiology Studies: No results found.     LOS: 10 days   Little Ishikawa MD  [facility] Pager on [email or web address]  11/15/2018, 8:05 AM

## 2018-11-15 NOTE — Progress Notes (Signed)
BG low, juice and snack given. Rechecked 19min after snack completion, BG rising steadily WNL.

## 2018-11-15 NOTE — Progress Notes (Signed)
BG low, juice given. Rechecked, and WNL.

## 2018-11-16 LAB — COMPREHENSIVE METABOLIC PANEL
ALT: 64 U/L — ABNORMAL HIGH (ref 0–44)
AST: 34 U/L (ref 15–41)
Albumin: 3.1 g/dL — ABNORMAL LOW (ref 3.5–5.0)
Alkaline Phosphatase: 108 U/L (ref 38–126)
Anion gap: 9 (ref 5–15)
BUN: 41 mg/dL — ABNORMAL HIGH (ref 8–23)
CO2: 25 mmol/L (ref 22–32)
Calcium: 8.5 mg/dL — ABNORMAL LOW (ref 8.9–10.3)
Chloride: 99 mmol/L (ref 98–111)
Creatinine, Ser: 1.49 mg/dL — ABNORMAL HIGH (ref 0.61–1.24)
GFR calc Af Amer: 52 mL/min — ABNORMAL LOW (ref >60)
GFR calc non Af Amer: 45 mL/min — ABNORMAL LOW (ref >60)
Glucose, Bld: 60 mg/dL — ABNORMAL LOW (ref 70–99)
Potassium: 4.8 mmol/L (ref 3.5–5.1)
Sodium: 133 mmol/L — ABNORMAL LOW (ref 135–145)
Total Bilirubin: 0.5 mg/dL (ref 0.3–1.2)
Total Protein: 6 g/dL — ABNORMAL LOW (ref 6.5–8.1)

## 2018-11-16 LAB — CBC WITH DIFFERENTIAL/PLATELET
Abs Immature Granulocytes: 0.5 10*3/uL — ABNORMAL HIGH (ref 0.00–0.07)
Basophils Absolute: 0.1 10*3/uL (ref 0.0–0.1)
Basophils Relative: 0 %
Eosinophils Absolute: 0.1 10*3/uL (ref 0.0–0.5)
Eosinophils Relative: 0 %
HCT: 48.3 % (ref 39.0–52.0)
Hemoglobin: 16.1 g/dL (ref 13.0–17.0)
Immature Granulocytes: 3 %
Lymphocytes Relative: 11 %
Lymphs Abs: 2 10*3/uL (ref 0.7–4.0)
MCH: 29.9 pg (ref 26.0–34.0)
MCHC: 33.3 g/dL (ref 30.0–36.0)
MCV: 89.6 fL (ref 80.0–100.0)
Monocytes Absolute: 1.6 10*3/uL — ABNORMAL HIGH (ref 0.1–1.0)
Monocytes Relative: 9 %
Neutro Abs: 14.4 10*3/uL — ABNORMAL HIGH (ref 1.7–7.7)
Neutrophils Relative %: 77 %
Platelets: 283 10*3/uL (ref 150–400)
RBC: 5.39 MIL/uL (ref 4.22–5.81)
RDW: 13.5 % (ref 11.5–15.5)
WBC: 18.6 10*3/uL — ABNORMAL HIGH (ref 4.0–10.5)
nRBC: 0 % (ref 0.0–0.2)

## 2018-11-16 LAB — GLUCOSE, CAPILLARY
Glucose-Capillary: 131 mg/dL — ABNORMAL HIGH (ref 70–99)
Glucose-Capillary: 155 mg/dL — ABNORMAL HIGH (ref 70–99)
Glucose-Capillary: 191 mg/dL — ABNORMAL HIGH (ref 70–99)
Glucose-Capillary: 220 mg/dL — ABNORMAL HIGH (ref 70–99)
Glucose-Capillary: 229 mg/dL — ABNORMAL HIGH (ref 70–99)
Glucose-Capillary: 354 mg/dL — ABNORMAL HIGH (ref 70–99)
Glucose-Capillary: 66 mg/dL — ABNORMAL LOW (ref 70–99)
Glucose-Capillary: 76 mg/dL (ref 70–99)

## 2018-11-16 LAB — D-DIMER, QUANTITATIVE: D-Dimer, Quant: 2.36 ug/mL-FEU — ABNORMAL HIGH (ref 0.00–0.50)

## 2018-11-16 LAB — C-REACTIVE PROTEIN: CRP: <0.8 mg/dL (ref <1.0)

## 2018-11-16 NOTE — Progress Notes (Signed)
PROGRESS NOTE  DEMON VOLANTE GTX:[id number] DOB: 11/15/1940 DOA: 11/03/2018  PCP: Velna Hatchet, MD  Brief History/Interval Summary:   78 y.o.BM PMHx  T2DM, PAD, HTN, OSA, and BPH who presented with dry cough and shortness of breath with positive covid exposures. He tested positive for covid and had bilateral CXR infiltrates, admitted to [facility] and developed hypoxia.  He was initially admitted to the progressive care unit.  However his respiratory status continued to get worse and he was transferred to the intensive care unit, given his requirement for heated high flow nasal cannula, he was weaned gradually, he is on 8 L high flow nasal cannula today, he will be transferred to progressive care.   Subjective/Interval History:  No significant events overnight, reports he had a good bowel movement, good night sleep, appetite has improved..   Assessment/Plan:  Acute Hypoxemic Resp. Failure due to Acute Covid 19 Viral Illness ? Concurrent heart failure exacerbation Previously Heated high flow dependent on upwards of 80% FiO2 Currently alternating high flow and nonrebreather to give patient a break and reduce episodes of epistaxis Antibiotics: No antibacterials Remdesivir: x 5 days - completed 7/14 Steroids: Solu-Medrol ongoing - continue to wean as tolerated Diuretics: Holding Lasix, diuresed well previously, appears euvolemic Actemra: Given on 7/10 and 7/11 Convalescent Plasma: Given on 7/11  Vitamin C and Zinc: Continue DVT Prophylaxis: Lovenox 60  mg subcu every 12 hours  The treatment plan and use of medications and known side effects were discussed with patient.  Some of the medications used are based on case reports/anecdotal data which are not peer-reviewed and has not been studied using randomized control trials.  Complete risks and long-term side effects are unknown, however in the best clinical judgment they seem to be of some benefit.  Patient agrees with the treatment plan and  want to receive these treatments as indicated.  Patient was given steroids, plasma, and Actemra.  Recent Labs    11/14/18 0500 11/15/18 0120 11/16/18 0500  DDIMER 2.08* 2.08* 2.36*  CRP 1.3* <0.8 <0.8   Lab Results  Component Value Date   SARSCOV2NAA POSITIVE (A) 11/03/2018    Abnormal chest x-ray/CT findings An area of concern noted in the left lung.  Concern for pneumatocele.  This appears to be resolved on following imaging .  Congestive heart failure- diastolic dysfunction EF 24% (stress echo Jan/2018) Questionably in acute exacerbation, resolving No formal echocardiogram reports available on epic - stress echo in distant records.   Patient was on ARB and diuretics at home.   Hold off on further echo - can certainly be pursued in the outpatient setting with cardiology.  Mild transaminitis, resolving Secondary to COVID-19 vs remdesivir. Down trending.Remains asymptomatic   Diabetes mellitus type 2 in the setting of obesity, poorly controlled CBG quite labile while on steroids which are now being weaned Hypoglycemia previously likely due to steroid wean -  Lantus currently down to 20 units . Continue Lantus and SSI.  HbA1c 7.3.   Chronic kidney disease stage III, stable -  Appears to be at baseline, creatinine is 1.5, continue to monitor closely as on IV diuresis - Continue to monitor urine output closely, try to keep negative fluid balance - His weight has been stable.  History of coronary artery disease Stable.  No chest pain.  Continue home medications.  Essential hypertension Elevated most likely due to steroids.  Hydralazine as needed.  Holding diuretic and his ARB.  Amlodipine was initiated yesterday.  Will adjust tomorrow if  blood pressure not optimally controlled.    History of peripheral arterial disease Continue cilostazol.  History of obstructive sleep apnea Cannot utilize CPAP at this time, he is on heated high flow nasal cannula(heated high flow nasal  cannula should be continued at nighttime only as replacement of CPAP)  History of diabetic neuropathy Continue gabapentin.  Morbid obesity BMI 36.65.  Recently diagnosed inclusion body myositis That would explain the elevated CK level.  Apparently followed at [facility] by a neurologist, Dr. Warnell Forester ([phone]).  CK level had shown improvement.   right IJ TLC, will see if appropriate to discontinue today if able to get reliable peripheral access  DVT Prophylaxis: Subcutaneous heparin PUD Prophylaxis: Continue Pepcid Code Status: Full code Family Communication: Discussed with the patient.  Discussing with his daughter on a daily basis.   Disposition Plan: Patient is improving, he will be transferred to progressive care.   Reason for Visit: Acute respiratory disease due to COVID-19  Consultants: Pulmonology  Procedures: Central line placement into the left internal jugular 7/10  Antibiotics: Anti-infectives (From admission, onward)   Start     Dose/Rate Route Frequency Ordered Stop   11/08/18 1330  remdesivir 100 mg in sodium chloride 0.9 % 250 mL IVPB     100 mg 500 mL/hr over 30 Minutes Intravenous Every 24 hours 11/07/18 1229 11/11/18 1304   11/07/18 1330  remdesivir 200 mg in sodium chloride 0.9 % 250 mL IVPB     200 mg 500 mL/hr over 30 Minutes Intravenous Once 11/07/18 1229 11/08/18 1529      Medications:  Scheduled:  sodium chloride   Intravenous Once   aspirin  81 mg Oral QHS   bisacodyl  10 mg Oral Once   brimonidine  1 drop Both Eyes BID   Chlorhexidine Gluconate Cloth  6 each Topical Daily   cilostazol  100 mg Oral BID   enoxaparin (LOVENOX) injection  60 mg Subcutaneous Q12H   famotidine  20 mg Oral QHS   folic acid  1 mg Oral Daily   gabapentin  600 mg Oral TID   insulin aspart  0-20 Units Subcutaneous TID WC   insulin aspart protamine- aspart  20 Units Subcutaneous BID WC   Ipratropium-Albuterol  2 puff Inhalation QID   latanoprost  1  drop Both Eyes QHS   mouth rinse  15 mL Mouth Rinse BID   methylPREDNISolone (SOLU-MEDROL) injection  40 mg Intravenous Daily   oxybutynin  5 mg Oral BID   polyethylene glycol  17 g Oral Once   senna-docusate  2 tablet Oral BID   sodium chloride flush  3 mL Intravenous Q12H   tamsulosin  0.4 mg Oral Daily   thiamine  100 mg Oral Daily   timolol  1 drop Both Eyes BID   vitamin C  500 mg Oral Daily   zinc sulfate  220 mg Oral Daily   Continuous:  TWS:[id number], benzonatate, chlorpheniramine-HYDROcodone, guaiFENesin-dextromethorphan, hydrALAZINE, ondansetron **OR** ondansetron (ZOFRAN) IV, senna-docusate, sodium chloride   Objective:  Vital Signs  Vitals:   11/15/18 1940 11/16/18 0000 11/16/18 0337 11/16/18 0746  BP: 125/76  (!) 107/54   Pulse: (!) 117 (!) 105 (!) 122   Resp: (!) 22 19 (!) 22   Temp: 99.5 F (37.5 C)  (!) 96.8 F (36 C) 97.7 F (36.5 C)  TempSrc: Axillary  Axillary Oral  SpO2: 100% 100% 95%   Weight:      Height:        Intake/Output Summary (  Last 24 hours) at 11/16/2018 0813 Last data filed at 11/16/2018 0339 Gross per 24 hour  Intake 410 ml  Output 750 ml  Net -340 ml   Filed Weights   11/10/18 0440 11/12/18 0500 11/13/18 0500  Weight: 119.8 kg 118.6 kg 118.2 kg    Awake Alert, Oriented X 3, No new F.N deficits, Normal affect Symmetrical Chest wall movement, Good air movement bilaterally, CTAB RRR,No Gallops,Rubs or new Murmurs, No Parasternal Heave +ve B.Sounds, Abd Soft, No tenderness, No rebound - guarding or rigidity. No Cyanosis, Clubbing or edema, No new Rash or bruise       Lab Results:  Data Reviewed: I have personally reviewed following labs and imaging studies  CBC: Recent Labs  Lab 11/12/18 0105 11/13/18 0500 11/14/18 0500 11/15/18 0120 11/16/18 0500  WBC 19.5* 17.4* 20.7* 24.3* 18.6*  NEUTROABS 15.1* 13.2* 15.1* 19.1* 14.4*  HGB 15.9 15.9 16.4 16.7 16.1  HCT 47.0 48.4 49.2 49.3 48.3  MCV 90.2 91.1 90.3  88.5 89.6  PLT 377 353 343 355 423    Basic Metabolic Panel: Recent Labs  Lab 11/10/18 0505 11/11/18 0510 11/12/18 0105 11/13/18 0500 11/14/18 0500 11/15/18 0120 11/16/18 0500  NA 138 139 137 137 135 132* 133*  K 4.2 3.7 4.6 4.5 4.4 4.4 4.8  CL 104 106 105 105 101 98 99  CO2 23 23 22 22 24  21* 25  GLUCOSE 203* 124* 222* 87 94 74 60*  BUN 40* 36* 43* 42* 48* 50* 41*  CREATININE 1.40* 1.24 1.51* 1.44* 1.58* 1.64* 1.49*  CALCIUM 8.4* 8.3* 8.5* 8.7* 8.9 8.8* 8.5*  MG 2.5* 2.6* 2.5*  --   --   --   --   PHOS 2.4*  --   --   --   --   --   --     GFR: Estimated Creatinine Clearance: 55.2 mL/min (A) (by C-G formula based on SCr of 1.49 mg/dL (H)).  Liver Function Tests: Recent Labs  Lab 11/12/18 0105 11/13/18 0500 11/14/18 0500 11/15/18 0120 11/16/18 0500  AST 64* 57* 42* 36 34  ALT 111* 113* 91* 79* 64*  ALKPHOS 93 95 102 106 108  BILITOT 0.3 0.5 0.6 0.5 0.5  PROT 6.4* 6.2* 6.2* 6.2* 6.0*  ALBUMIN 3.1* 3.3* 3.3* 3.2* 3.1*     Cardiac Enzymes: No results for input(s): CKTOTAL, CKMB, CKMBINDEX, TROPONINI in the last 168 hours.  HbA1C: No results for input(s): HGBA1C in the last 72 hours.  CBG: Recent Labs  Lab 11/15/18 1551 11/15/18 1937 11/16/18 0000 11/16/18 0334 11/16/18 0744  GLUCAP 281* 251* 131* 76 66*    Lipid Profile: No results for input(s): CHOL, HDL, LDLCALC, TRIG, CHOLHDL, LDLDIRECT in the last 72 hours.  Anemia Panel: No results for input(s): VITAMINB12, FOLATE, FERRITIN, TIBC, IRON, RETICCTPCT in the last 72 hours.  No results found for this or any previous visit (from the past 240 hour(s)).    Radiology Studies: No results found.     LOS: 11 days   Little Ishikawa MD  [facility] Pager on [email or web address]  11/16/2018, 8:13 AM

## 2018-11-16 NOTE — Plan of Care (Addendum)
Calling daughter to update on Dads POC and answer any questions, but received no answer and voicemail.  Left message with call back # for when she is able to talk.  Daughter called back and updated her on current status and POC.  No other questions at this time.

## 2018-11-17 LAB — CBC WITH DIFFERENTIAL/PLATELET
Abs Immature Granulocytes: 0.31 10*3/uL — ABNORMAL HIGH (ref 0.00–0.07)
Basophils Absolute: 0 10*3/uL (ref 0.0–0.1)
Basophils Relative: 0 %
Eosinophils Absolute: 0.1 10*3/uL (ref 0.0–0.5)
Eosinophils Relative: 0 %
HCT: 47.4 % (ref 39.0–52.0)
Hemoglobin: 16.1 g/dL (ref 13.0–17.0)
Immature Granulocytes: 2 %
Lymphocytes Relative: 10 %
Lymphs Abs: 1.9 10*3/uL (ref 0.7–4.0)
MCH: 30.4 pg (ref 26.0–34.0)
MCHC: 34 g/dL (ref 30.0–36.0)
MCV: 89.4 fL (ref 80.0–100.0)
Monocytes Absolute: 1.8 10*3/uL — ABNORMAL HIGH (ref 0.1–1.0)
Monocytes Relative: 9 %
Neutro Abs: 15.5 10*3/uL — ABNORMAL HIGH (ref 1.7–7.7)
Neutrophils Relative %: 79 %
Platelets: 260 10*3/uL (ref 150–400)
RBC: 5.3 MIL/uL (ref 4.22–5.81)
RDW: 13.6 % (ref 11.5–15.5)
WBC: 19.6 10*3/uL — ABNORMAL HIGH (ref 4.0–10.5)
nRBC: 0 % (ref 0.0–0.2)

## 2018-11-17 LAB — COMPREHENSIVE METABOLIC PANEL
ALT: 56 U/L — ABNORMAL HIGH (ref 0–44)
AST: 30 U/L (ref 15–41)
Albumin: 3.3 g/dL — ABNORMAL LOW (ref 3.5–5.0)
Alkaline Phosphatase: 111 U/L (ref 38–126)
Anion gap: 9 (ref 5–15)
BUN: 40 mg/dL — ABNORMAL HIGH (ref 8–23)
CO2: 25 mmol/L (ref 22–32)
Calcium: 8.9 mg/dL (ref 8.9–10.3)
Chloride: 98 mmol/L (ref 98–111)
Creatinine, Ser: 1.62 mg/dL — ABNORMAL HIGH (ref 0.61–1.24)
GFR calc Af Amer: 47 mL/min — ABNORMAL LOW (ref >60)
GFR calc non Af Amer: 41 mL/min — ABNORMAL LOW (ref >60)
Glucose, Bld: 94 mg/dL (ref 70–99)
Potassium: 4.9 mmol/L (ref 3.5–5.1)
Sodium: 132 mmol/L — ABNORMAL LOW (ref 135–145)
Total Bilirubin: 0.9 mg/dL (ref 0.3–1.2)
Total Protein: 5.9 g/dL — ABNORMAL LOW (ref 6.5–8.1)

## 2018-11-17 LAB — GLUCOSE, CAPILLARY
Glucose-Capillary: 143 mg/dL — ABNORMAL HIGH (ref 70–99)
Glucose-Capillary: 98 mg/dL (ref 70–99)
Glucose-Capillary: 64 mg/dL — ABNORMAL LOW (ref 70–99)
Glucose-Capillary: 209 mg/dL — ABNORMAL HIGH (ref 70–99)
Glucose-Capillary: 379 mg/dL — ABNORMAL HIGH (ref 70–99)
Glucose-Capillary: 302 mg/dL — ABNORMAL HIGH (ref 70–99)

## 2018-11-17 LAB — C-REACTIVE PROTEIN: CRP: <0.8 mg/dL (ref <1.0)

## 2018-11-17 LAB — D-DIMER, QUANTITATIVE: D-Dimer, Quant: 2.37 ug/mL-FEU — ABNORMAL HIGH (ref 0.00–0.50)

## 2018-11-17 NOTE — Progress Notes (Signed)
Occupational Therapy Treatment Patient Details Name: Andrew Larsen MRN: [id number] DOB: 1940/11/14 Today's Date: 11/17/2018    History of present illness Andrew Larsen is a 78 y.o. male with medical history significant of PAD, DM, HTN, BPH, OSA who presents due to SOB and nonproductive cough. Positive for Covid .  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 Reported multiple falls.   OT comments  Pt continues to present with decreased activity tolerance. Upon arrival, pt SpO2 in 70s on 6L O2 via NRB. Placed on 10L O2 via NRB and pt elevated to 85%. Pt requiring Min A +2 and RW for functional transfer to recliner for lunch. Pt SpO2 dropping to 74% and he required significant rest break to recover; cued for purse lip breathing. Notified RN. Update dc recommendation to post-acute rehab. Pt is very motivated and has good family support, feel he would benefit from intensive rehab at [facility] pending his progress and activity tolerance. Will continue to follow acutely as admitted.    Follow Up Recommendations  Supervision/Assistance - 24 hour;SNF;CIR(Pending pt progress)    Equipment Recommendations  3 in 1 bedside commode;Tub/shower seat    Recommendations for Other Services PT consult    Precautions / Restrictions Precautions Precautions: Fall Precaution Comments: right LE is much weaker than left and may buckle, foot drop bilt, desats quickly Restrictions Weight Bearing Restrictions: No       Mobility Bed Mobility Overal bed mobility: Needs Assistance Bed Mobility: Rolling;Sidelying to Sit Rolling: Min guard Sidelying to sit: Min guard;HOB elevated       General bed mobility comments: use of rail, "threw" the legs to bed edge  Transfers Overall transfer level: Needs assistance Equipment used: Rolling walker (2 wheeled) Transfers: Sit to/from Stand Sit to Stand: Min assist;+2  safety/equipment;From elevated surface Stand pivot transfers: Min assist;+2 safety/equipment       General transfer comment: patient  wanted to stand without the Rw, highly encouraged to use RW. smalll steps to transfer to recliner, decreased control of descent.    Balance Overall balance assessment: History of Falls;Needs assistance Sitting-balance support: Bilateral upper extremity supported;Feet supported Sitting balance-Leahy Scale: Good     Standing balance support: Bilateral upper extremity supported;During functional activity Standing balance-Leahy Scale: Poor Standing balance comment: Reliant on UE support                           ADL either performed or assessed with clinical judgement   ADL Overall ADL's : Needs assistance/impaired   Eating/Feeding Details (indicate cue type and reason): Switching to 15L via HFNC from the NRB to eat.                     Toilet Transfer: Minimal assistance;+2 for safety/equipment;Stand-pivot;RW(simulated to recliner) Toilet Transfer Details (indicate cue type and reason): Min A for balance and safe descent to recliner         Functional mobility during ADLs: Minimal assistance;+2 for safety/equipment;Rolling walker(stand pivot only) General ADL Comments: Pt presenting with poor activity tolerance. SpO2 dropping significantly with coughing. Very motivted to get OOB and to recliner for lunch     Vision       Perception     Praxis      Cognition Arousal/Alertness: Awake/alert Behavior During Therapy: [facility] for tasks assessed/performed;Flat affect Overall Cognitive Status: Within Functional Limits for tasks assessed  General Comments: appears weary, coughing frequently.        Exercises     Shoulder Instructions       General Comments Upon arrival, pt on 6L via NRB with humitifier. Per protocal, placed pt on 10L O2 with NRB and took away humitifier and  placed to direct port. Notified RN. With activity, SpO2 dropping to 73% on 15L via NRB. Requiring significant rest break to return to 93%.     Pertinent Vitals/ Pain       Pain Assessment: Faces Faces Pain Scale: Hurts little more Pain Location: chest from coughing Pain Descriptors / Indicators: Discomfort Pain Intervention(s): Monitored during session;Repositioned  Home Living                                          Prior Functioning/Environment              Frequency  Min 3X/week        Progress Toward Goals  OT Goals(current goals can now be found in the care plan section)  Progress towards OT goals: Progressing toward goals  Acute Rehab OT Goals Patient Stated Goal: to feel better, home soon OT Goal Formulation: With patient Time For Goal Achievement: 11/18/18 Potential to Achieve Goals: Good ADL Goals Pt Will Perform Grooming: with min guard assist;standing Pt Will Perform Lower Body Dressing: with min guard assist;with adaptive equipment;sit to/from stand Pt Will Transfer to Toilet: with min guard assist;bedside commode;ambulating Pt Will Perform Toileting - Clothing Manipulation and hygiene: with min guard assist;sit to/from stand;sitting/lateral leans Pt Will Perform Tub/Shower Transfer: with min assist;shower seat;ambulating;Tub transfer  Plan Discharge plan needs to be updated    Co-evaluation    PT/OT/SLP Co-Evaluation/Treatment: Yes Reason for Co-Treatment: Complexity of the patient's impairments (multi-system involvement);For patient/therapist safety;To address functional/ADL transfers(For activity tolerance) PT goals addressed during session: Mobility/safety with mobility OT goals addressed during session: ADL's and self-care      AM-PAC OT "6 Clicks" Daily Activity     Outcome Measure   Help from another person eating meals?: None Help from another person taking care of personal grooming?: A Little Help from another person  toileting, which includes using toliet, bedpan, or urinal?: A Lot Help from another person bathing (including washing, rinsing, drying)?: A Lot Help from another person to put on and taking off regular upper body clothing?: A Little Help from another person to put on and taking off regular lower body clothing?: A Lot 6 Click Score: 16    End of Session Equipment Utilized During Treatment: Oxygen(10L-15L)  OT Visit Diagnosis: Unsteadiness on feet (R26.81);Other abnormalities of gait and mobility (R26.89);Muscle weakness (generalized) (M62.81)   Activity Tolerance Patient tolerated treatment well   Patient Left in chair;with call bell/phone within reach;with SCD's reapplied   Nurse Communication Mobility status        Time: 2423-5361 OT Time Calculation (min): 32 min  Charges: OT General Charges $OT Visit: 1 Visit OT Treatments $Self Care/Home Management : 8-22 mins  [place], OTR/L Acute Rehab Pager: [phone] Office: [place] 11/17/2018, 5:12 PM

## 2018-11-17 NOTE — Progress Notes (Signed)
Mo acute changes throughout night. Tolerated HFNC majority of night. Began to desat dearly morning. Switched to NRB approximately 5am. No distress noted.

## 2018-11-17 NOTE — Plan of Care (Signed)
Spoke with daughter on phone and update on dads current status and POC

## 2018-11-17 NOTE — Progress Notes (Addendum)
Physical Therapy Treatment Patient Details Name: Andrew Larsen MRN: [id number] DOB: March 25, 1941 Today's Date: 11/17/2018    History of Present Illness Andrew Larsen is a 78 y.o. male with medical history significant of PAD, DM, HTN, BPH, OSA who presents due to SOB and nonproductive cough. Positive for Covid .  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 Reported multiple falls.    PT Comments     The patient 's saturation in 70's upon arrival of therapy. Placed on 10 L NRB,with sats increasing to 85%. HR 130' RR 35. Increased to 15 L NRB to mobilize to recliner with Sats dropping down into 70's, RR 37, HR 137  Gradually returning to 88%. RN  Aware.   Follow Up Recommendations  SNF;Home health PT;CIR(depends on progress)     Equipment Recommendations  None recommended by PT    Recommendations for Other Services       Precautions / Restrictions Precautions Precaution Comments: right LE is much weaker than left and may buckle, foot drop bilt, desats quickly , on NRB.sign    Mobility  Bed Mobility Overal bed mobility: Needs Assistance Bed Mobility: Rolling;Sidelying to Sit Rolling: Min guard Sidelying to sit: Min guard;HOB elevated       General bed mobility comments: use of rail, "threw" the legs to bed edge  Transfers Overall transfer level: Needs assistance Equipment used: Rolling walker (2 wheeled) Transfers: Sit to/from Stand Sit to Stand: Min assist;+2 safety/equipment;From elevated surface Stand pivot transfers: Min assist;+2 safety/equipment       General transfer comment: patient  wanted to stand without the Rw, highly encouraged to use RW. smalll steps to transfer to recliner, decreased control of descent.  Ambulation/Gait             General Gait Details: Patient's sats 71% upon arrivwl. Noted to be on NRB on 6 L with humidifier. Removed  humidifier  and placed on 10 L with stas increasing to 855. Increased to 15 L for transfer.   Stairs             Wheelchair Mobility    Modified Rankin (Stroke Patients Only)       Balance Overall balance assessment: History of Falls;Needs assistance Sitting-balance support: Bilateral upper extremity supported;Feet supported Sitting balance-Leahy Scale: Good     Standing balance support: Bilateral upper extremity supported;During functional activity Standing balance-Leahy Scale: Poor Standing balance comment: Reliant on UE support                            Cognition                                       General Comments: appears weary, coughing frequently.      Exercises      General Comments        Pertinent Vitals/Pain Faces Pain Scale: Hurts little more Pain Location: chest from coughing Pain Descriptors / Indicators: Discomfort Pain Intervention(s): Monitored during session;Limited activity within patient's tolerance    Home Living                      Prior Function            PT Goals (current goals can now be found in the care plan section)  Acute Rehab PT Goals PT Goal Formulation: With patient Time For Goal Achievement: 12/01/18 Potential to Achieve Goals: Fair Progress towards PT goals: Not progressing toward goals - comment;Goals downgraded-see care plan(pt has  progressively worsened pulmonary status.)    Frequency    Min 3X/week      PT Plan Discharge plan needs to be updated    Co-evaluation PT/OT/SLP Co-Evaluation/Treatment: Yes Reason for Co-Treatment: For patient/therapist safety;Complexity of the patient's impairments (multi-system involvement) PT goals addressed during session: Mobility/safety with mobility OT goals addressed during session: ADL's and self-care      AM-PAC PT "6 Clicks" Mobility   Outcome Measure  Help needed turning from your back to your side while in a flat bed without  using bedrails?: A Little Help needed moving from lying on your back to sitting on the side of a flat bed without using bedrails?: A Little Help needed moving to and from a bed to a chair (including a wheelchair)?: A Little Help needed standing up from a chair using your arms (e.g., wheelchair or bedside chair)?: A Lot Help needed to walk in hospital room?: A Lot Help needed climbing 3-5 steps with a railing? : Total 6 Click Score: 3    End of Session Equipment Utilized During Treatment: Oxygen(NRB)   Patient left: in chair;with call bell/phone within reach;with nursing/sitter in room Nurse Communication: Mobility status PT Visit Diagnosis: Unsteadiness on feet (R26.81);Muscle weakness (generalized) (M62.81);Repeated falls (R29.6)     Time: 8280-0349 PT Time Calculation (min) (ACUTE ONLY): 33 min  Charges:  $Therapeutic Exercise: 8-22 mins                     Tresa Endo PT Acute Rehabilitation Services  Office [phone]    Claretha Cooper 11/17/2018, 2:40 PM

## 2018-11-17 NOTE — Progress Notes (Signed)
PROGRESS NOTE  Andrew Larsen:[id number] DOB: 1940-12-19 DOA: 11/03/2018  PCP: Velna Hatchet, MD  Brief History/Interval Summary:   78 y.o.BM PMHx  T2DM, PAD, HTN, OSA, and BPH who presented with dry cough and shortness of breath with positive covid exposures. He tested positive for covid and had bilateral CXR infiltrates, admitted to [facility] and developed hypoxia.  He was initially admitted to the progressive care unit.  However his respiratory status continued to get worse and he was transferred to the intensive care unit, given his requirement for heated high flow nasal cannula, he was weaned gradually, he is on 8 L high flow nasal cannula today, he will be transferred to progressive care.   Subjective/Interval History:  No significant events overnight, reports he had a good bowel movement, good night sleep, appetite has improved.  Assessment/Plan:  Acute Hypoxemic Resp. Failure due to Acute Covid 19 Viral Illness ? Concurrent heart failure exacerbation Previously Heated high flow dependent on upwards of 80% FiO2 Currently alternating high flow and nonrebreather to give patient a break and reduce episodes of epistaxis Continue to wean oxygen as tolerated; Currently: SpO2: 92 % O2 Flow Rate (L/min): 10 L/min FiO2 (%): 60 % Antibiotics: No antibacterials Remdesivir: x 5 days - completed 7/14 Steroids: Solu-Medrol ongoing - continue to wean as tolerated Diuretics: Holding Lasix, diuresed well previously, appears euvolemic Actemra: Given on 7/10 and 7/11 Convalescent Plasma: Given on 7/11  Vitamin C and Zinc: Continue DVT Prophylaxis: Lovenox 60  mg subcu every 12 hours Recent Labs    11/15/18 0120 11/16/18 0500 11/17/18 0500  DDIMER 2.08* 2.36* 2.37*  CRP <0.8 <0.8 <0.8   Lab Results  Component Value Date   SARSCOV2NAA POSITIVE (A) 11/03/2018  The treatment plan and use of medications and known side effects were discussed with patient.  Some of the medications used are  based on case reports/anecdotal data which are not peer-reviewed and has not been studied using randomized control trials.  Complete risks and long-term side effects are unknown, however in the best clinical judgment they seem to be of some benefit.  Patient agrees with the treatment plan and want to receive these treatments as indicated.  Patient was given steroids, plasma, and Actemra.  Abnormal chest x-ray/CT findings An area of concern noted in the left lung.  Concern for pneumatocele.  This appears to be resolved on following imaging .  Congestive heart failure- diastolic dysfunction EF 67% (stress echo Jan/2018) Questionably in acute exacerbation, resolving No formal echocardiogram reports available on epic - stress echo in distant records.   Patient was on ARB and diuretics at home.   Hold off on further echo - can certainly be pursued in the outpatient setting with cardiology.  Mild transaminitis, resolving Secondary to COVID-19 vs remdesivir. Down trending.Remains asymptomatic   Diabetes mellitus type 2 in the setting of obesity, poorly controlled CBG quite labile while on steroids which are now being weaned Hypoglycemia previously likely due to steroid wean -  Lantus currently down to 20 units . Continue Lantus and SSI.  HbA1c 7.3.   Chronic kidney disease stage III, stable -  Appears to be at baseline, creatinine is 1.5, continue to monitor closely as on IV diuresis - Continue to monitor urine output closely, try to keep negative fluid balance - His weight has been stable.  History of coronary artery disease Stable.  No chest pain.  Continue home medications.  Essential hypertension Elevated most likely due to steroids.  Hydralazine as needed.  Holding diuretic and his ARB.  Amlodipine was initiated yesterday.  Will adjust tomorrow if blood pressure not optimally controlled.    History of peripheral arterial disease Continue cilostazol.  History of obstructive sleep  apnea Cannot utilize CPAP at this time, he is on heated high flow nasal cannula(heated high flow nasal cannula should be continued at nighttime only as replacement of CPAP)  History of diabetic neuropathy Continue gabapentin.  Morbid obesity BMI 36.65.  Recently diagnosed inclusion body myositis That would explain the elevated CK level.  Apparently followed at [facility] by a neurologist, Dr. Warnell Forester ([id number]).  CK level had shown improvement.   right IJ TLC, will see if appropriate to discontinue today if able to get reliable peripheral access  DVT Prophylaxis: Subcutaneous heparin PUD Prophylaxis: Continue Pepcid Code Status: Full code Family Communication: Discussed with the patient.  Discussing with his daughter on a daily basis.   Disposition Plan: Patient is improving, he will be transferred to progressive care.   Reason for Visit: Acute respiratory disease due to COVID-19  Consultants: Pulmonology  Procedures: Central line placement into the left internal jugular 7/10  Antibiotics: Anti-infectives (From admission, onward)   Start     Dose/Rate Route Frequency Ordered Stop   11/08/18 1330  remdesivir 100 mg in sodium chloride 0.9 % 250 mL IVPB     100 mg 500 mL/hr over 30 Minutes Intravenous Every 24 hours 11/07/18 1229 11/11/18 1304   11/07/18 1330  remdesivir 200 mg in sodium chloride 0.9 % 250 mL IVPB     200 mg 500 mL/hr over 30 Minutes Intravenous Once 11/07/18 1229 11/08/18 1529      Medications:  Scheduled:  sodium chloride   Intravenous Once   aspirin  81 mg Oral QHS   bisacodyl  10 mg Oral Once   brimonidine  1 drop Both Eyes BID   Chlorhexidine Gluconate Cloth  6 each Topical Daily   cilostazol  100 mg Oral BID   enoxaparin (LOVENOX) injection  60 mg Subcutaneous Q12H   famotidine  20 mg Oral QHS   folic acid  1 mg Oral Daily   gabapentin  600 mg Oral TID   insulin aspart  0-20 Units Subcutaneous TID WC   insulin aspart protamine-  aspart  20 Units Subcutaneous BID WC   Ipratropium-Albuterol  2 puff Inhalation QID   latanoprost  1 drop Both Eyes QHS   mouth rinse  15 mL Mouth Rinse BID   methylPREDNISolone (SOLU-MEDROL) injection  40 mg Intravenous Daily   oxybutynin  5 mg Oral BID   polyethylene glycol  17 g Oral Once   senna-docusate  2 tablet Oral BID   sodium chloride flush  3 mL Intravenous Q12H   tamsulosin  0.4 mg Oral Daily   thiamine  100 mg Oral Daily   timolol  1 drop Both Eyes BID   vitamin C  500 mg Oral Daily   zinc sulfate  220 mg Oral Daily   Continuous:  MHD:[id number], benzonatate, chlorpheniramine-HYDROcodone, guaiFENesin-dextromethorphan, hydrALAZINE, ondansetron **OR** ondansetron (ZOFRAN) IV, senna-docusate, sodium chloride   Objective:  Vital Signs  Vitals:   11/16/18 1545 11/16/18 1946 11/17/18 0339 11/17/18 0500  BP:  (!) 145/81 135/89   Pulse:  (!) 118 (!) 117   Resp:   (!) 22   Temp: 98.6 F (37 C) 97.6 F (36.4 C) (!) 97.5 F (36.4 C)   TempSrc: Oral Oral Oral   SpO2:  100% 90% 92%  Weight:  Height:        Intake/Output Summary (Last 24 hours) at 11/17/2018 0738 Last data filed at 11/17/2018 0300 Gross per 24 hour  Intake 300 ml  Output 725 ml  Net -425 ml   Filed Weights   11/10/18 0440 11/12/18 0500 11/13/18 0500  Weight: 119.8 kg 118.6 kg 118.2 kg    Awake Alert, Oriented X 3, No new F.N deficits, Normal affect Symmetrical Chest wall movement, Good air movement bilaterally, CTAB RRR,No Gallops,Rubs or new Murmurs, No Parasternal Heave +ve B.Sounds, Abd Soft, No tenderness, No rebound - guarding or rigidity. No Cyanosis, Clubbing or edema, No new Rash or bruise       Lab Results:  Data Reviewed: I have personally reviewed following labs and imaging studies  CBC: Recent Labs  Lab 11/13/18 0500 11/14/18 0500 11/15/18 0120 11/16/18 0500 11/17/18 0500  WBC 17.4* 20.7* 24.3* 18.6* 19.6*  NEUTROABS 13.2* 15.1* 19.1* 14.4* 15.5*   HGB 15.9 16.4 16.7 16.1 16.1  HCT 48.4 49.2 49.3 48.3 47.4  MCV 91.1 90.3 88.5 89.6 89.4  PLT 353 343 355 283 638    Basic Metabolic Panel: Recent Labs  Lab 11/11/18 0510 11/12/18 0105 11/13/18 0500 11/14/18 0500 11/15/18 0120 11/16/18 0500 11/17/18 0500  NA 139 137 137 135 132* 133* 132*  K 3.7 4.6 4.5 4.4 4.4 4.8 4.9  CL 106 105 105 101 98 99 98  CO2 23 22 22 24  21* 25 25  GLUCOSE 124* 222* 87 94 74 60* 94  BUN 36* 43* 42* 48* 50* 41* 40*  CREATININE 1.24 1.51* 1.44* 1.58* 1.64* 1.49* 1.62*  CALCIUM 8.3* 8.5* 8.7* 8.9 8.8* 8.5* 8.9  MG 2.6* 2.5*  --   --   --   --   --     GFR: Estimated Creatinine Clearance: 50.8 mL/min (A) (by C-G formula based on SCr of 1.62 mg/dL (H)).  Liver Function Tests: Recent Labs  Lab 11/13/18 0500 11/14/18 0500 11/15/18 0120 11/16/18 0500 11/17/18 0500  AST 57* 42* 36 34 30  ALT 113* 91* 79* 64* 56*  ALKPHOS 95 102 106 108 111  BILITOT 0.5 0.6 0.5 0.5 0.9  PROT 6.2* 6.2* 6.2* 6.0* 5.9*  ALBUMIN 3.3* 3.3* 3.2* 3.1* 3.3*     Cardiac Enzymes: No results for input(s): CKTOTAL, CKMB, CKMBINDEX, TROPONINI in the last 168 hours.  HbA1C: No results for input(s): HGBA1C in the last 72 hours.  CBG: Recent Labs  Lab 11/16/18 1159 11/16/18 1543 11/16/18 1943 11/16/18 2342 11/17/18 0332  GLUCAP 220* 354* 229* 191* 143*    Lipid Profile: No results for input(s): CHOL, HDL, LDLCALC, TRIG, CHOLHDL, LDLDIRECT in the last 72 hours.  Anemia Panel: No results for input(s): VITAMINB12, FOLATE, FERRITIN, TIBC, IRON, RETICCTPCT in the last 72 hours.  No results found for this or any previous visit (from the past 240 hour(s)).    Radiology Studies: No results found.     LOS: 12 days   Little Ishikawa MD  [facility] Pager on [email or web address]  11/17/2018, 7:38 AM

## 2018-11-18 ENCOUNTER — Inpatient Hospital Stay (HOSPITAL_COMMUNITY): Payer: Medicare HMO

## 2018-11-18 LAB — GLUCOSE, CAPILLARY
Glucose-Capillary: 143 mg/dL — ABNORMAL HIGH (ref 70–99)
Glucose-Capillary: 233 mg/dL — ABNORMAL HIGH (ref 70–99)
Glucose-Capillary: 252 mg/dL — ABNORMAL HIGH (ref 70–99)
Glucose-Capillary: 307 mg/dL — ABNORMAL HIGH (ref 70–99)
Glucose-Capillary: 72 mg/dL (ref 70–99)
Glucose-Capillary: 92 mg/dL (ref 70–99)

## 2018-11-18 MED ORDER — FUROSEMIDE 10 MG/ML IJ SOLN
40.0000 mg | Freq: Once | INTRAMUSCULAR | Status: AC
  Administered 2018-11-18: 40 mg via INTRAVENOUS
  Filled 2018-11-18: qty 4

## 2018-11-18 NOTE — Plan of Care (Signed)
Pt was destating on nasal cannula as low as 70. Switched from the nasal cannula to the non re-breather and he is doing fine now. MD made aware.

## 2018-11-18 NOTE — Progress Notes (Signed)
PROGRESS NOTE  Andrew Larsen FGH:[id number] DOB: July 29, 1940 DOA: 11/03/2018  PCP: Velna Hatchet, MD  Brief History/Interval Summary:   78 y.o.BM PMHx  T2DM, PAD, HTN, OSA, and BPH who presented with dry cough and shortness of breath with positive covid exposures. He tested positive for covid and had bilateral CXR infiltrates, admitted to [facility] and developed hypoxia.  He was initially admitted to the progressive care unit.  However his respiratory status continued to get worse and he was transferred to the intensive care unit, given his requirement for heated high flow nasal cannula, he was weaned gradually, he is on 8 L high flow nasal cannula today, he will be transferred to progressive care.   Subjective/Interval History: No significant events overnight, reports he had a good bowel movement, good night sleep, appetite has improved.  Assessment/Plan:  Acute hypoxemic resp. failure due to acute covid 19 viral illness, improving ? Concurrent heart failure exacerbation, resolved Previously Heated high flow dependent on upwards of 80% FiO2 Currently alternating high flow and nonrebreather to give patient a break and reduce episodes of epistaxis Continue to wean oxygen as tolerated; Currently weaned to 10L NRB/6L HF Antibiotics: No antibacterials Remdesivir: x 5 days - completed 7/14 Steroids: Solu-Medrol ongoing - continue to wean as tolerated Diuretics: Holding Lasix, diuresed well previously, appears euvolemic Actemra: Given on 7/10 and 7/11 Convalescent Plasma: Given on 7/11  Vitamin C and Zinc: Continue DVT Prophylaxis: Lovenox 60 mg subcu every 12 hours Recent Labs    11/16/18 0500 11/17/18 0500  DDIMER 2.36* 2.37*  CRP <0.8 <0.8   Lab Results  Component Value Date   SARSCOV2NAA POSITIVE (A) 11/03/2018  The treatment plan and use of medications and known side effects were discussed with patient.  Some of the medications used are based on case reports/anecdotal data which  are not peer-reviewed and has not been studied using randomized control trials.  Complete risks and long-term side effects are unknown, however in the best clinical judgment they seem to be of some benefit.  Patient agrees with the treatment plan and want to receive these treatments as indicated.  Patient was given steroids, plasma, and Actemra.  Congestive heart failure- diastolic dysfunction EF 67% (stress echo Jan 2018) Questionably in acute exacerbation, resolving No formal echocardiogram reports available on epic - stress echo in distant records.   Patient was on ARB and diuretics at home.   Hold off on further echo - can certainly be pursued in the outpatient setting with cardiology.  Mild transaminitis, resolving Secondary to COVID-19 vs remdesivir. Down trending.Remains asymptomatic   Diabetes mellitus type 2 in the setting of obesity, poorly controlled CBG quite labile while on steroids which are now being weaned Hypoglycemia previously likely due to steroid wean -  Lantus currently down to 20 units . Continue Lantus and SSI.  HbA1c 7.3.   Abnormal chest x-ray/CT findings An area of concern noted in the left lung.  ? pneumatocele. This appears to be resolved on following imaging, repeat imaging if necessary/symptomatic.  Chronic kidney disease stage III, stable Appears to be at baseline, creatinine is 1.5, continue to monitor closely as on IV diuresis Continue to monitor urine output closely, try to keep negative fluid balance His weight has been stable.  History of coronary artery disease Stable. No chest pain. Continue home medications.  Essential hypertension Elevated most likely due to steroids. Hydralazine as needed. Holding diuretic and his ARB.  Amlodipine was initiated  History of peripheral arterial disease Continue cilostazol.  History of obstructive sleep apnea Cannot utilize CPAP at this time, he is on heated high flow nasal cannula (heated high flow nasal  cannula should be continued at nighttime only as replacement of CPAP)  History of diabetic neuropathy Continue gabapentin.  Morbid obesity BMI 36.65.  Recently diagnosed inclusion body myositis That would explain the elevated CK level.  Apparently followed at [facility] by a neurologist, Dr. Warnell Forester ([phone]).  CK level had shown improvement.  Right IJ TLC, will see if appropriate to discontinue today if able to get reliable peripheral access - remains an issue  DVT Prophylaxis: Subcutaneous heparin PUD Prophylaxis: Continue Pepcid Code Status: Full code Family Communication: Discussed with the patient.  Disposition Plan: Patient is improving, continue progressive care. Disposition pending clinical improvement.  Reason for Visit: Acute respiratory disease due to COVID-19 Consultants: Pulmonology Procedures: Central line placement into the left internal jugular 7/10 Antibiotics: Anti-infectives (From admission, onward)   Start     Dose/Rate Route Frequency Ordered Stop   11/08/18 1330  remdesivir 100 mg in sodium chloride 0.9 % 250 mL IVPB     100 mg 500 mL/hr over 30 Minutes Intravenous Every 24 hours 11/07/18 1229 11/11/18 1304   11/07/18 1330  remdesivir 200 mg in sodium chloride 0.9 % 250 mL IVPB     200 mg 500 mL/hr over 30 Minutes Intravenous Once 11/07/18 1229 11/08/18 1529      Medications:  Scheduled: . sodium chloride   Intravenous Once  . aspirin  81 mg Oral QHS  . bisacodyl  10 mg Oral Once  . brimonidine  1 drop Both Eyes BID  . Chlorhexidine Gluconate Cloth  6 each Topical Daily  . cilostazol  100 mg Oral BID  . enoxaparin (LOVENOX) injection  60 mg Subcutaneous Q12H  . famotidine  20 mg Oral QHS  . folic acid  1 mg Oral Daily  . gabapentin  600 mg Oral TID  . insulin aspart  0-20 Units Subcutaneous TID WC  . insulin aspart protamine- aspart  20 Units Subcutaneous BID WC  . Ipratropium-Albuterol  2 puff Inhalation QID  . latanoprost  1 drop Both Eyes  QHS  . mouth rinse  15 mL Mouth Rinse BID  . methylPREDNISolone (SOLU-MEDROL) injection  40 mg Intravenous Daily  . oxybutynin  5 mg Oral BID  . polyethylene glycol  17 g Oral Once  . senna-docusate  2 tablet Oral BID  . sodium chloride flush  3 mL Intravenous Q12H  . tamsulosin  0.4 mg Oral Daily  . thiamine  100 mg Oral Daily  . timolol  1 drop Both Eyes BID  . vitamin C  500 mg Oral Daily  . zinc sulfate  220 mg Oral Daily   Continuous:  [id number], benzonatate, chlorpheniramine-HYDROcodone, guaiFENesin-dextromethorphan, ondansetron **OR** ondansetron (ZOFRAN) IV, senna-docusate, sodium chloride   Objective:  Vital Signs  Vitals:   11/18/18 0855 11/18/18 1205 11/18/18 1215 11/18/18 1219  BP: (!) 132/94     Pulse:  (!) 114 (!) 118 (!) 124  Resp:  19 (!) 21 (!) 24  Temp:      TempSrc:      SpO2:  93% 91% 93%  Weight:      Height:        Intake/Output Summary (Last 24 hours) at 11/18/2018 1339 Last data filed at 11/18/2018 0855 Gross per 24 hour  Intake 560 ml  Output 1000 ml  Net -440 ml   Filed Weights   11/12/18 0500 11/13/18  0500 11/18/18 0500  Weight: 118.6 kg 118.2 kg 115.3 kg    Awake Alert, Oriented X 3, No new F.N deficits, Normal affect Symmetrical Chest wall movement, Good air movement bilaterally, CTAB RRR,No Gallops,Rubs or new Murmurs, No Parasternal Heave +ve B.Sounds, Abd Soft, No tenderness, No rebound - guarding or rigidity. No Cyanosis, Clubbing or edema, No new Rash or bruise       Lab Results:  Data Reviewed: I have personally reviewed following labs and imaging studies  CBC: Recent Labs  Lab 11/13/18 0500 11/14/18 0500 11/15/18 0120 11/16/18 0500 11/17/18 0500  WBC 17.4* 20.7* 24.3* 18.6* 19.6*  NEUTROABS 13.2* 15.1* 19.1* 14.4* 15.5*  HGB 15.9 16.4 16.7 16.1 16.1  HCT 48.4 49.2 49.3 48.3 47.4  MCV 91.1 90.3 88.5 89.6 89.4  PLT 353 343 355 283 768    Basic Metabolic Panel: Recent Labs  Lab 11/12/18 0105 11/13/18  0500 11/14/18 0500 11/15/18 0120 11/16/18 0500 11/17/18 0500  NA 137 137 135 132* 133* 132*  K 4.6 4.5 4.4 4.4 4.8 4.9  CL 105 105 101 98 99 98  CO2 22 22 24  21* 25 25  GLUCOSE 222* 87 94 74 60* 94  BUN 43* 42* 48* 50* 41* 40*  CREATININE 1.51* 1.44* 1.58* 1.64* 1.49* 1.62*  CALCIUM 8.5* 8.7* 8.9 8.8* 8.5* 8.9  MG 2.5*  --   --   --   --   --     GFR: Estimated Creatinine Clearance: 50.1 mL/min (A) (by C-G formula based on SCr of 1.62 mg/dL (H)).  Liver Function Tests: Recent Labs  Lab 11/13/18 0500 11/14/18 0500 11/15/18 0120 11/16/18 0500 11/17/18 0500  AST 57* 42* 36 34 30  ALT 113* 91* 79* 64* 56*  ALKPHOS 95 102 106 108 111  BILITOT 0.5 0.6 0.5 0.5 0.9  PROT 6.2* 6.2* 6.2* 6.0* 5.9*  ALBUMIN 3.3* 3.3* 3.2* 3.1* 3.3*     Cardiac Enzymes: No results for input(s): CKTOTAL, CKMB, CKMBINDEX, TROPONINI in the last 168 hours.  HbA1C: No results for input(s): HGBA1C in the last 72 hours.  CBG: Recent Labs  Lab 11/17/18 2029 11/18/18 0020 11/18/18 0358 11/18/18 0823 11/18/18 1138  GLUCAP 302* 143* 92 72 233*    Lipid Profile: No results for input(s): CHOL, HDL, LDLCALC, TRIG, CHOLHDL, LDLDIRECT in the last 72 hours.  Anemia Panel: No results for input(s): VITAMINB12, FOLATE, FERRITIN, TIBC, IRON, RETICCTPCT in the last 72 hours.  No results found for this or any previous visit (from the past 240 hour(s)).    Radiology Studies: No results found.     LOS: 13 days   Little Ishikawa MD  [facility] Pager on [email or web address]  11/18/2018, 1:39 PM

## 2018-11-18 NOTE — Progress Notes (Signed)
Patient stats are in the high 90s with the non rebreather, switched patient back to HFNC at 15L. His stats remain stable in the mid to high  90s.

## 2018-11-18 NOTE — Plan of Care (Signed)
Notified MD, patient began to decline this shift. He was unable to sustain on the HFNC, so switched patient to the non rebreather. MD aware. Patient has increased in tachycardia throughout  the shift and has now been sustaining. MD aware. Although he ate well for breakfast, he was unable to eat for lunch, stating he was too tired to eat. Patient states he felt like he needed to have a bowel movement, but has not had one this shift. He has been voiding appropriately. See flow sheet for heart rate and oxygen saturation. See MD's updated orders.

## 2018-11-18 NOTE — Progress Notes (Signed)
0705 pts O2 sat was 76% on 15lHFNC, pts HR was increased 106 & RR up to 24. Initially repositioned him with little improvement bu changed over to NRB 15l for about 30 mins allowing him time to recover to 97%. Pt did well on HFNC all night with sats >95%

## 2018-11-19 ENCOUNTER — Inpatient Hospital Stay (HOSPITAL_COMMUNITY): Payer: Medicare HMO

## 2018-11-19 ENCOUNTER — Inpatient Hospital Stay: Payer: Self-pay

## 2018-11-19 DIAGNOSIS — G7241 Inclusion body myositis [IBM]: Secondary | ICD-10-CM

## 2018-11-19 LAB — COMPREHENSIVE METABOLIC PANEL
ALT: 61 U/L — ABNORMAL HIGH (ref 0–44)
AST: 31 U/L (ref 15–41)
Albumin: 3.1 g/dL — ABNORMAL LOW (ref 3.5–5.0)
Alkaline Phosphatase: 95 U/L (ref 38–126)
Anion gap: 10 (ref 5–15)
BUN: 41 mg/dL — ABNORMAL HIGH (ref 8–23)
CO2: 28 mmol/L (ref 22–32)
Calcium: 9.1 mg/dL (ref 8.9–10.3)
Chloride: 96 mmol/L — ABNORMAL LOW (ref 98–111)
Creatinine, Ser: 1.55 mg/dL — ABNORMAL HIGH (ref 0.61–1.24)
GFR calc Af Amer: 50 mL/min — ABNORMAL LOW (ref >60)
GFR calc non Af Amer: 43 mL/min — ABNORMAL LOW (ref >60)
Glucose, Bld: 108 mg/dL — ABNORMAL HIGH (ref 70–99)
Potassium: 4.5 mmol/L (ref 3.5–5.1)
Sodium: 134 mmol/L — ABNORMAL LOW (ref 135–145)
Total Bilirubin: 0.8 mg/dL (ref 0.3–1.2)
Total Protein: 6 g/dL — ABNORMAL LOW (ref 6.5–8.1)

## 2018-11-19 LAB — GLUCOSE, CAPILLARY
Glucose-Capillary: 113 mg/dL — ABNORMAL HIGH (ref 70–99)
Glucose-Capillary: 121 mg/dL — ABNORMAL HIGH (ref 70–99)
Glucose-Capillary: 185 mg/dL — ABNORMAL HIGH (ref 70–99)
Glucose-Capillary: 197 mg/dL — ABNORMAL HIGH (ref 70–99)
Glucose-Capillary: 278 mg/dL — ABNORMAL HIGH (ref 70–99)
Glucose-Capillary: 279 mg/dL — ABNORMAL HIGH (ref 70–99)

## 2018-11-19 LAB — D-DIMER, QUANTITATIVE: D-Dimer, Quant: 2.88 ug/mL-FEU — ABNORMAL HIGH (ref 0.00–0.50)

## 2018-11-19 LAB — CBC
HCT: 48.6 % (ref 39.0–52.0)
Hemoglobin: 16.1 g/dL (ref 13.0–17.0)
MCH: 29.8 pg (ref 26.0–34.0)
MCHC: 33.1 g/dL (ref 30.0–36.0)
MCV: 90 fL (ref 80.0–100.0)
Platelets: 208 10*3/uL (ref 150–400)
RBC: 5.4 MIL/uL (ref 4.22–5.81)
RDW: 13.8 % (ref 11.5–15.5)
WBC: 14.1 10*3/uL — ABNORMAL HIGH (ref 4.0–10.5)
nRBC: 0 % (ref 0.0–0.2)

## 2018-11-19 LAB — FERRITIN: Ferritin: 363 ng/mL — ABNORMAL HIGH (ref 24–336)

## 2018-11-19 LAB — C-REACTIVE PROTEIN: CRP: <0.8 mg/dL (ref <1.0)

## 2018-11-19 MED ORDER — ENOXAPARIN SODIUM 60 MG/0.6ML ~~LOC~~ SOLN
60.0000 mg | SUBCUTANEOUS | Status: DC
  Administered 2018-11-20 – 2018-12-01 (×12): 60 mg via SUBCUTANEOUS
  Filled 2018-11-19 (×12): qty 0.6

## 2018-11-19 MED ORDER — OXYMETAZOLINE HCL 0.05 % NA SOLN
1.0000 | Freq: Two times a day (BID) | NASAL | Status: AC
  Administered 2018-11-19 – 2018-11-20 (×3): 1 via NASAL
  Filled 2018-11-19: qty 15

## 2018-11-19 NOTE — Progress Notes (Signed)
Occupational Therapy Treatment Patient Details Name: Andrew Larsen MRN: [id number] DOB: Dec 05, 1940 Today's Date: 11/19/2018    History of present illness Andrew Larsen is a 78 y.o. male with medical history significant of PAD, DM, HTN, BPH, OSA who presents due to SOB and nonproductive cough. Positive for Covid .  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 Reported multiple falls.   OT comments  Pt progressing towards established OT goals and continues to present with decreased activity tolerance with significant fatigue. Switching pt from 15L via HFNC to 15L via NRB to perform sponge bath at EOB. Pt performing UB bathing with Min and LB bathing with Mod A. Pt performing side steps towards EOB with Min A +2 and RW. SpO2 dropping to 84% on 15L via NRB and able to return to >88% with significant seated rest break. Assisting pt to sidelying position with Mod A and switched back to 15L via HFNC.Continue to recommend dc to post-acute rehab and will continue to follow acutely as admitted.    Follow Up Recommendations  SNF;CIR;Supervision/Assistance - 24 hour(Pending pt progress)    Equipment Recommendations  3 in 1 bedside commode;Tub/shower seat    Recommendations for Other Services PT consult    Precautions / Restrictions Precautions Precautions: Fall Precaution Comments: right LE is much weaker than left and may buckle, foot drop bilt, desats quickly Restrictions Weight Bearing Restrictions: No       Mobility Bed Mobility Overal bed mobility: Needs Assistance Bed Mobility: Rolling;Sidelying to Sit Rolling: Min guard Sidelying to sit: Min guard;HOB elevated Supine to sit: Min assist Sit to supine: Min assist   General bed mobility comments: use of rail, "threw" the legs to bed edge  Transfers Overall transfer level: Needs assistance Equipment used: Rolling walker (2  wheeled) Transfers: Sit to/from Stand Sit to Stand: Min assist;+2 safety/equipment;From elevated surface         General transfer comment: Min A to power up into standing and then for safety in descent    Balance Overall balance assessment: History of Falls;Needs assistance Sitting-balance support: Bilateral upper extremity supported;Feet supported Sitting balance-Leahy Scale: Good     Standing balance support: Bilateral upper extremity supported;During functional activity Standing balance-Leahy Scale: Poor Standing balance comment: Reliant on UE support                           ADL either performed or assessed with clinical judgement   ADL Overall ADL's : Needs assistance/impaired     Grooming: Min guard;Sitting;Wash/dry face   Upper Body Bathing: Minimal assistance;Sitting Upper Body Bathing Details (indicate cue type and reason): Min A for washing his back due to fatigue. Pt washing axillary areas  while seated at EOB Lower Body Bathing: Moderate assistance;Sit to/from stand Lower Body Bathing Details (indicate cue type and reason): Pt able to wash legs and anterior peri area while seated with lateral cleaning. Mod A for cleaning posterior peri area Upper Body Dressing : Min guard;Sitting                   Functional mobility during ADLs: Minimal assistance;+2 for safety/equipment;Rolling walker(side stepping towards EOB) General ADL Comments: Pt performing bath while seated at EOB     Vision       Perception     Praxis      Cognition Arousal/Alertness: Awake/alert Behavior During Therapy: WFL for tasks assessed/performed;Flat  affect Overall Cognitive Status: Within Functional Limits for tasks assessed                                 General Comments: appears weary, coughing frequently.        Exercises     Shoulder Instructions       General Comments Spo2 dropping to 83% on 15L via NRB. Pt RR in 30-40s and HR 130s.      Pertinent Vitals/ Pain       Pain Assessment: Faces Faces Pain Scale: Hurts little more Pain Location: chest from coughing Pain Descriptors / Indicators: Discomfort Pain Intervention(s): Monitored during session;Limited activity within patient's tolerance;Repositioned  Home Living                                          Prior Functioning/Environment              Frequency  Min 3X/week        Progress Toward Goals  OT Goals(current goals can now be found in the care plan section)  Progress towards OT goals: Progressing toward goals  Acute Rehab OT Goals Patient Stated Goal: to feel better, home soon OT Goal Formulation: With patient Time For Goal Achievement: 11/18/18 Potential to Achieve Goals: Good ADL Goals Pt Will Perform Grooming: with min guard assist;standing Pt Will Perform Lower Body Dressing: with min guard assist;with adaptive equipment;sit to/from stand Pt Will Transfer to Toilet: with min guard assist;bedside commode;ambulating Pt Will Perform Toileting - Clothing Manipulation and hygiene: with min guard assist;sit to/from stand;sitting/lateral leans Pt Will Perform Tub/Shower Transfer: with min assist;shower seat;ambulating;Tub transfer  Plan Discharge plan remains appropriate    Co-evaluation    PT/OT/SLP Co-Evaluation/Treatment: Yes Reason for Co-Treatment: Complexity of the patient's impairments (multi-system involvement);For patient/therapist safety;To address functional/ADL transfers   OT goals addressed during session: ADL's and self-care      AM-PAC OT "6 Clicks" Daily Activity     Outcome Measure   Help from another person eating meals?: None Help from another person taking care of personal grooming?: A Little Help from another person toileting, which includes using toliet, bedpan, or urinal?: A Lot Help from another person bathing (including washing, rinsing, drying)?: A Lot Help from another person to put on and  taking off regular upper body clothing?: A Little Help from another person to put on and taking off regular lower body clothing?: A Lot 6 Click Score: 16    End of Session Equipment Utilized During Treatment: Oxygen(15L)  OT Visit Diagnosis: Unsteadiness on feet (R26.81);Other abnormalities of gait and mobility (R26.89);Muscle weakness (generalized) (M62.81)   Activity Tolerance Patient tolerated treatment well   Patient Left in chair;with call bell/phone within reach   Nurse Communication Mobility status        Time: 0258-5277 OT Time Calculation (min): 50 min  Charges: OT General Charges $OT Visit: 1 Visit OT Treatments $Self Care/Home Management : 23-37 mins  [place], OTR/L Acute Rehab Pager: [phone] Office: [place] 11/19/2018, 1:48 PM

## 2018-11-19 NOTE — Progress Notes (Signed)
Physical Therapy Treatment Patient Details Name: Andrew Larsen MRN: [id number] DOB: Aug 25, 1940 Today's Date: 11/19/2018    History of Present Illness Andrew Larsen is a 78 y.o. male with medical history significant of PAD, DM, HTN, BPH, OSA who presents due to SOB and nonproductive cough. Positive for Covid .  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 Reported multiple falls.    PT Comments    Patient tolerated increased activity standing for assist for bath and bed changed. Patient  Switched from HFNC 15L to NRB for activity with Sao2 > 90% monitored on forehead with [employer] monitor. HR up to 120', RR mid-hi 30's. Continue progressive mobility as tolerated.  Placed on  Left side and  Back on HFNC at 15 L.   Follow Up Recommendations  SNF;Home health PT;CIR     Equipment Recommendations  None recommended by PT    Recommendations for Other Services       Precautions / Restrictions Precautions Precautions: Fall Precaution Comments: right LE is much weaker than left and may buckle, foot drop bilt, desats quickly Restrictions Weight Bearing Restrictions: No    Mobility  Bed Mobility Overal bed mobility: Needs Assistance Bed Mobility: Rolling;Sidelying to Sit;Sit to Sidelying Rolling: Min guard Sidelying to sit: Min assist;HOB elevated     Sit to sidelying: Mod assist General bed mobility comments: Requiring Mod A to elevate BLEs over EOB to reach sidelying position.   Transfers Overall transfer level: Needs assistance Equipment used: Rolling walker (2 wheeled) Transfers: Sit to/from Stand Sit to Stand: Min assist;+2 safety/equipment;From elevated surface         General transfer comment: Min A to power up into standing and then for safety in descent  Ambulation/Gait Ambulation/Gait assistance: +2 safety/equipment;Mod assist;+2 physical assistance   Assistive  device: Rolling walker (2 wheeled) Gait Pattern/deviations: Step-to pattern     General Gait Details: side steps along bed x 5 with RW.   Stairs             Wheelchair Mobility    Modified Rankin (Stroke Patients Only)       Balance Overall balance assessment: History of Falls;Needs assistance Sitting-balance support: Bilateral upper extremity supported;Feet supported Sitting balance-Leahy Scale: Good     Standing balance support: Bilateral upper extremity supported;During functional activity Standing balance-Leahy Scale: Poor Standing balance comment: Reliant on UE support                            Cognition Arousal/Alertness: Awake/alert Behavior During Therapy: WFL for tasks assessed/performed;Flat affect Overall Cognitive Status: Within Functional Limits for tasks assessed                                 General Comments: appears weary, coughing frequently.      Exercises      General Comments General comments (skin integrity, edema, etc.): Spo2 dropping to 83% on 15L via NRB. Pt RR in 30-40s and HR 130s.       Pertinent Vitals/Pain Pain Assessment: Faces Faces Pain Scale: Hurts little more Pain Location: chest from coughing, buttock peri area. Pain Descriptors / Indicators: Discomfort Pain Intervention(s): Monitored during session    Home Living                      Prior Function  PT Goals (current goals can now be found in the care plan section) Acute Rehab PT Goals Patient Stated Goal: to feel better, home soon Progress towards PT goals: Progressing toward goals    Frequency    Min 3X/week      PT Plan Current plan remains appropriate    Co-evaluation   Reason for Co-Treatment: Complexity of the patient's impairments (multi-system involvement) PT goals addressed during session: Mobility/safety with mobility OT goals addressed during session: ADL's and self-care      AM-PAC PT "6  Clicks" Mobility   Outcome Measure  Help needed turning from your back to your side while in a flat bed without using bedrails?: A Little Help needed moving from lying on your back to sitting on the side of a flat bed without using bedrails?: A Little Help needed moving to and from a bed to a chair (including a wheelchair)?: A Lot Help needed standing up from a chair using your arms (e.g., wheelchair or bedside chair)?: A Lot Help needed to walk in hospital room?: Total Help needed climbing 3-5 steps with a railing? : Total 6 Click Score: 12    End of Session Equipment Utilized During Treatment: Oxygen Activity Tolerance: Patient tolerated treatment well Patient left: in bed;with call bell/phone within reach Nurse Communication: Mobility status PT Visit Diagnosis: Unsteadiness on feet (R26.81);Muscle weakness (generalized) (M62.81);Repeated falls (R29.6)     Time: 4970-2637 PT Time Calculation (min) (ACUTE ONLY): 50 min  Charges:  $Therapeutic Activity: 8-22 mins                     Tresa Endo PT Acute Rehabilitation Services  Office [phone]    Claretha Cooper 11/19/2018, 2:28 PM

## 2018-11-19 NOTE — Progress Notes (Addendum)
PROGRESS NOTE                                                                                                                                                                                                             Patient Demographics:    Andrew Larsen, is a 78 y.o. male, DOB - Jan 03, 1941, WGY:[id number]  Outpatient Primary MD for the patient is Velna Hatchet, MD    LOS - 14  Chief Complaint  Patient presents with  . Covid 19  . Cough       Brief Narrative: Patient is a 78 y.o. male with PMHx of DM-2, HTN, BPH, PAD, OSA-who presented with dry cough, shortness of breath-was found to have acute hypoxic respiratory failure in the setting of COVID-19 pneumonia.  Hospital course complicated by worsening hypoxemia-necessitating transfer to the intensive care unit.  Upon further stability-he was transferred back to progressive care unit.   Subjective:    Kalai Baca today remains on high flow O2-had mild epistaxis earlier this morning that resolved on its own.   Assessment  & Plan :   Acute Hypoxic Resp Failure due to Covid 19 Viral pneumonia: Remains on significant amount of high flow O2-plan is to continue supportive care.  Has completed a course of Remdesivir, and is s/p Actemra.  Inflammatory markers are downtrending-but patient remains significantly hypoxic.  No signs of volume overload on my exam, -8 L so far during this hospital stay.  Hold Lasix today.  COVID-19 Labs:  Recent Labs    11/17/18 0500 11/19/18 0609  DDIMER 2.37* 2.88*  FERRITIN  --  363*  CRP <0.8 <0.8    Lab Results  Component Value Date   SARSCOV2NAA POSITIVE (A) 11/03/2018     COVID-19 Medications: 7/10 and 7/11>> Actemra 7/11>> convalescent plasma 7/10-7/14>> Remdesivir  Mild epistaxis: Occurred earlier this morning-continue supportive care-Afrin nasal spray for 2 days.  Chronic diastolic heart failure: Clinically  compensated-holding Lasix today.  DM-2 (A1c 7.3 on 11/08/2018): CBGs currently stable-continue insulin 70/30 20 reds twice daily and SSI  HTN: Currently controlled-not on any antihypertensives-follow.  CAD: No anginal symptoms-continue aspirin  PAD: Appears stable-continue cilostazol and aspirin  BPH: Continue Flomax  OSA: Unable to use CPAP-continue high flow oxygen  Diabetic peripheral neuropathy: Continue Neurontin  Recently diagnosed inclusion body myositis: Follow with neurology at [facility]-Dr. Warnell Forester ([phone]  Other-has left IJ CVL placed on 7/12 per prior documentation-we will remove and place PICC line   ABG:    Component Value Date/Time   PHART 7.459 (H) 11/07/2018 1312   PCO2ART 30.5 (L) 11/07/2018 1312   PO2ART 46.0 (L) 11/07/2018 1312   HCO3 21.7 11/07/2018 1312   TCO2 23 11/07/2018 1312   ACIDBASEDEF 1.0 11/07/2018 1312   O2SAT 85.0 11/07/2018 1312    Condition - Extremely Guarded-remains very tenuous and at risk for further deterioration-daughter aware  Family Communication  : Spoke with patient's daughter over the phone  Code Status :  Full Code  Diet :  Diet Order            Diet regular Room service appropriate? Yes; Fluid consistency: Thin  Diet effective now               Disposition Plan  :  Remain inpatient  Consults  :  PCCM  Procedures  :   7/10>> left IJ CVL  DVT Prophylaxis  :  Lovenox at twice daily dosing-watch d-dimer  Lab Results  Component Value Date   PLT 208 11/19/2018    Inpatient Medications  Scheduled Meds: . sodium chloride   Intravenous Once  . aspirin  81 mg Oral QHS  . bisacodyl  10 mg Oral Once  . brimonidine  1 drop Both Eyes BID  . Chlorhexidine Gluconate Cloth  6 each Topical Daily  . cilostazol  100 mg Oral BID  . enoxaparin (LOVENOX) injection  60 mg Subcutaneous Q12H  . famotidine  20 mg Oral QHS  . folic acid  1 mg Oral Daily  . gabapentin  600 mg Oral TID  . insulin aspart  0-20 Units Subcutaneous  TID WC  . insulin aspart protamine- aspart  20 Units Subcutaneous BID WC  . Ipratropium-Albuterol  2 puff Inhalation QID  . latanoprost  1 drop Both Eyes QHS  . mouth rinse  15 mL Mouth Rinse BID  . methylPREDNISolone (SOLU-MEDROL) injection  40 mg Intravenous Daily  . oxybutynin  5 mg Oral BID  . oxymetazoline  1 spray Each Nare BID  . polyethylene glycol  17 g Oral Once  . senna-docusate  2 tablet Oral BID  . sodium chloride flush  3 mL Intravenous Q12H  . tamsulosin  0.4 mg Oral Daily  . thiamine  100 mg Oral Daily  . timolol  1 drop Both Eyes BID  . vitamin C  500 mg Oral Daily  . zinc sulfate  220 mg Oral Daily   Continuous Infusions: PRN Meds:.acetaminophen, benzonatate, chlorpheniramine-HYDROcodone, guaiFENesin-dextromethorphan, ondansetron **OR** ondansetron (ZOFRAN) IV, senna-docusate, sodium chloride  Antibiotics  :    Anti-infectives (From admission, onward)   Start     Dose/Rate Route Frequency Ordered Stop   11/08/18 1330  remdesivir 100 mg in sodium chloride 0.9 % 250 mL IVPB     100 mg 500 mL/hr over 30 Minutes Intravenous Every 24 hours 11/07/18 1229 11/11/18 1304   11/07/18 1330  remdesivir 200 mg in sodium chloride 0.9 % 250 mL IVPB     200 mg 500 mL/hr over 30 Minutes Intravenous Once 11/07/18 1229 11/08/18 1529       Time Spent in minutes  35   Oren Binet M.D on 11/19/2018 at 12:14 PM  To page go to [email or web address] - use universal password  [facility] -  Office  [phone]   See all Orders from today for further details   Admit date - 11/03/2018  14    Objective:   Vitals:   11/19/18 0234 11/19/18 0325 11/19/18 0800 11/19/18 0914  BP:  101/66 (!) 139/96   Pulse: (!) 105  (!) 107   Resp: (!) 22     Temp:  98.6 F (37 C)  (!) 97.3 F (36.3 C)  TempSrc:  Oral  Oral  SpO2: 93%  90%   Weight:      Height:        Wt Readings from Last 3 Encounters:  11/18/18 115.3 kg  05/06/18 117.9 kg  04/28/18 122.8 kg      Intake/Output Summary (Last 24 hours) at 11/19/2018 1214 Last data filed at 11/19/2018 0210 Gross per 24 hour  Intake 600 ml  Output 2200 ml  Net -1600 ml     Physical Exam Gen Exam:Alert awake-not in any distress HEENT:atraumatic, normocephalic Chest: B/L clear to auscultation anteriorly CVS:S1S2 regular Abdomen:soft non tender, non distended Extremities:no edema Neurology: Non focal Skin: no rash   Data Review:    CBC Recent Labs  Lab 11/13/18 0500 11/14/18 0500 11/15/18 0120 11/16/18 0500 11/17/18 0500 11/19/18 0609  WBC 17.4* 20.7* 24.3* 18.6* 19.6* 14.1*  HGB 15.9 16.4 16.7 16.1 16.1 16.1  HCT 48.4 49.2 49.3 48.3 47.4 48.6  PLT 353 343 355 283 260 208  MCV 91.1 90.3 88.5 89.6 89.4 90.0  MCH 29.9 30.1 30.0 29.9 30.4 29.8  MCHC 32.9 33.3 33.9 33.3 34.0 33.1  RDW 13.7 13.7 13.5 13.5 13.6 13.8  LYMPHSABS 1.8 2.2 1.9 2.0 1.9  --   MONOABS 1.3* 1.8* 1.8* 1.6* 1.8*  --   EOSABS 0.1 0.1 0.0 0.1 0.1  --   BASOSABS 0.1 0.1 0.1 0.1 0.0  --     Chemistries  Recent Labs  Lab 11/14/18 0500 11/15/18 0120 11/16/18 0500 11/17/18 0500 11/19/18 0609  NA 135 132* 133* 132* 134*  K 4.4 4.4 4.8 4.9 4.5  CL 101 98 99 98 96*  CO2 24 21* 25 25 28   GLUCOSE 94 74 60* 94 108*  BUN 48* 50* 41* 40* 41*  CREATININE 1.58* 1.64* 1.49* 1.62* 1.55*  CALCIUM 8.9 8.8* 8.5* 8.9 9.1  AST 42* 36 34 30 31  ALT 91* 79* 64* 56* 61*  ALKPHOS 102 106 108 111 95  BILITOT 0.6 0.5 0.5 0.9 0.8   ------------------------------------------------------------------------------------------------------------------ No results for input(s): CHOL, HDL, LDLCALC, TRIG, CHOLHDL, LDLDIRECT in the last 72 hours.  Lab Results  Component Value Date   HGBA1C 7.3 (H) 11/08/2018   ------------------------------------------------------------------------------------------------------------------ No results for input(s): TSH, T4TOTAL, T3FREE, THYROIDAB in the last 72 hours.  Invalid input(s): FREET3  ------------------------------------------------------------------------------------------------------------------ Recent Labs    11/19/18 0609  FERRITIN 363*    Coagulation profile No results for input(s): INR, PROTIME in the last 168 hours.  Recent Labs    11/17/18 0500 11/19/18 0609  DDIMER 2.37* 2.88*    Cardiac Enzymes No results for input(s): CKMB, TROPONINI, MYOGLOBIN in the last 168 hours.  Invalid input(s): CK ------------------------------------------------------------------------------------------------------------------    Component Value Date/Time   BNP 15.9 09/19/2015 1030    Micro Results No results found for this or any previous visit (from the past 240 hour(s)).  Radiology Reports Dg Chest Port 1 View  Result Date: 11/18/2018 CLINICAL DATA:  Hypoxia. EXAM: PORTABLE CHEST 1 VIEW COMPARISON:  11/13/2018. FINDINGS: Left IJ line noted stable position. Cardiomegaly. Diffuse bilateral severe pulmonary infiltrates/edema. No pleural effusion or pneumothorax. IMPRESSION: 1.  Left IJ line noted stable position. 2. Cardiomegaly. Diffuse bilateral  severe pulmonary infiltrates/edema again noted. No interim change. Electronically Signed   By: Kurten   On: 11/18/2018 16:16   Dg Chest Port 1 View  Result Date: 11/13/2018 CLINICAL DATA:  Acute respiratory failure with hypoxia. EXAM: PORTABLE CHEST 1 VIEW COMPARISON:  11/11/2018 FINDINGS: Left IJ central venous catheter unchanged with tip obliquely oriented over the junction of the brachiocephalic vein to SVC. Patient is rotated to the left. Lungs are hypoinflated demonstrate interval worsening of hazy central airspace opacification right worse than left. No definite effusion. Mild stable cardiomegaly. Remainder of the exam is unchanged. IMPRESSION: Interval worsening bilateral hazy central airspace opacification right worse than left likely asymmetric interstitial edema and less likely infection. Stable cardiomegaly.   Left IJ central venous catheter unchanged. Electronically Signed   By: Marin Olp M.D.   On: 11/13/2018 08:59   Dg Chest Port 1 View  Result Date: 11/11/2018 CLINICAL DATA:  Acute respiratory failure due to hypoxemia. EXAM: PORTABLE CHEST 1 VIEW COMPARISON:  Radiograph November 10, 2018. FINDINGS: Stable cardiomediastinal silhouette. Left internal jugular catheter is unchanged in position. No pneumothorax or significant pleural effusion is noted. Stable bilateral lung opacities are noted concerning for edema or pneumonia. Bony thorax is unremarkable. IMPRESSION: Stable bilateral lung opacities as described above. Electronically Signed   By: Marijo Conception M.D.   On: 11/11/2018 07:34   Dg Chest Port 1 View  Result Date: 11/10/2018 CLINICAL DATA:  Pneumatocele of lung EXAM: PORTABLE CHEST 1 VIEW COMPARISON:  Radiograph 11/10/2018 FINDINGS: Central venous line with tip in the distal brachiocephalic vein unchanged. Stable cardiac silhouette. Fine multifocal airspace disease is slightly increased in density compared to prior as noted over the RIGHT hemidiaphragm and LEFT upper lobe. Low lung volumes. IMPRESSION: Mild increase in multifocal fine airspace disease. Electronically Signed   By: Suzy Bouchard M.D.   On: 11/10/2018 10:32   Dg Chest Port 1 View  Result Date: 11/10/2018 CLINICAL DATA:  78 year old male with COVID-19 pneumonia EXAM: PORTABLE CHEST 1 VIEW COMPARISON:  Prior chest x-ray 11/07/2018 FINDINGS: Stable position of left IJ central venous catheter. The catheter tip overlies the innominate vein. Improving appearance of multifocal patchy interstitial and airspace opacities bilaterally. There is a region of focal clearing in the left mid lung just superior to the cardiac margin which could represent a developing pneumatocele. No acute osseous abnormality. IMPRESSION: 1. Improving appearance of bilateral multifocal interstitial and airspace opacities compared to 11/07/2018. 2. Region of more  focal and slightly rounded lucency in the left mid lung could represent a developing pneumatocele versus a region of more advanced clearing. 3. Stable position of left IJ central venous catheter. Electronically Signed   By: Jacqulynn Cadet M.D.   On: 11/10/2018 08:01   Dg Chest Port 1 View  Result Date: 11/07/2018 CLINICAL DATA:  Central line placement, confirm positioning. EXAM: PORTABLE CHEST 1 VIEW COMPARISON:  Numerous chest radiographs, most recently November 07, 2018, CTA chest Sep 19, 2015. FINDINGS: Interval placement of a left internal jugular approach central venous catheter. Catheter tip terminates near the confluence of the brachiocephalic vein and superior vena cava, could be advanced to insure positioning within the lower SVC/cavoatrial junction. Widespread bilateral airspace opacities are again seen most pronounced in the right mid and lower lung. No pneumothorax or effusion is present. Cardiac silhouette remains enlarged. Cardiomediastinal contours are essentially unchanged from prior studies. No acute osseous or soft tissue abnormality. IMPRESSION: 1. Interval placement of a left internal jugular approach central venous catheter.  Catheter tip terminates near the confluence of the brachiocephalic vein and superior vena cava, could be advanced 2-3 cm to ensure positioning within the lower SVC/cavoatrial junction. 2. No procedural complication identified. 3. Unchanged diffuse bilateral airspace disease. Electronically Signed   By: MD Lovena Le   On: 11/07/2018 19:06   Dg Chest Port 1 View  Result Date: 11/07/2018 CLINICAL DATA:  Shortness of breath EXAM: PORTABLE CHEST 1 VIEW COMPARISON:  November 05, 2018 FINDINGS: The heart size and mediastinal contours are stable. The heart size is enlarged. Hazy opacities are identified in the right lung and to a lesser degree in the left lung, worse compared to prior exam. There is no pleural effusion. The visualized skeletal structures are unremarkable.  IMPRESSION: Bilateral pulmonary infiltrates are slightly worse compared to prior exam of November 05, 2018. Electronically Signed   By: Abelardo Diesel M.D.   On: 11/07/2018 12:10   Dg Chest Port 1 View  Result Date: 11/05/2018 CLINICAL DATA:  Acute respiratory disease secondary to COVID-19. EXAM: PORTABLE CHEST 1 VIEW 5:11 a.m. COMPARISON:  11/03/2018 and 03/25/2016 FINDINGS: Faint bilateral pulmonary infiltrates appear slightly improved at the bases. Heart size and pulmonary vascularity are normal. No effusions. No bone abnormality. IMPRESSION: Slight improvement in faint bilateral pulmonary infiltrates. Electronically Signed   By: Lorriane Shire M.D.   On: 11/05/2018 05:50   Dg Chest Port 1 View  Result Date: 11/03/2018 CLINICAL DATA:  Cough.  COVID-19. EXAM: PORTABLE CHEST 1 VIEW COMPARISON:  03/25/2016. FINDINGS: Mediastinum hilar structures normal. Cardiomegaly with mild pulmonary venous congestion and bilateral interstitial prominence. CHF could present this fashion. Pneumonitis particularly in light of the patient's history of COVID-19 infection could also present this fashion. Small left pleural effusion cannot be excluded. IMPRESSION: 1. Cardiomegaly with mild pulmonary venous congestion and bilateral interstitial prominence. CHF could present in this fashion. Pneumonitis particularly in light of the patient's history of COVID-19 infection could also present this fashion. 2.  Small left pleural effusion cannot be excluded. Electronically Signed   By: Marcello Moores  Register   On: 11/03/2018 14:31   Dg Chest Port 1v Same Day  Result Date: 11/19/2018 CLINICAL DATA:  Pneumonia in a patient who is COVID-19 positive. EXAM: PORTABLE CHEST 1 VIEW COMPARISON:  Single-view of the chest 11/18/2018 and 11/13/2018. FINDINGS: Left IJ catheter is unchanged. Extensive bilateral airspace disease persists without change given differences in technique. Heart size is normal. No pneumothorax or pleural effusion. IMPRESSION: No  notable change in extensive bilateral airspace disease. Electronically Signed   By: Inge Rise M.D.   On: 11/19/2018 11:51

## 2018-11-19 NOTE — Progress Notes (Signed)
Pt was OOB to [facility] when his nose started to bleed, writer  Prompted  patient to place chin on chest while pinching nose removed Snyder held NRB over nose and mouth while attempting to stop nose bleed, writer also  suctioned patient's mouth twice. After about 15 mins patient's nose stopped bleeding, NrB reminds in place will continue to monitor.

## 2018-11-20 ENCOUNTER — Encounter (HOSPITAL_COMMUNITY): Payer: Medicare HMO

## 2018-11-20 ENCOUNTER — Inpatient Hospital Stay: Payer: Self-pay

## 2018-11-20 LAB — GLUCOSE, CAPILLARY
Glucose-Capillary: 118 mg/dL — ABNORMAL HIGH (ref 70–99)
Glucose-Capillary: 133 mg/dL — ABNORMAL HIGH (ref 70–99)
Glucose-Capillary: 174 mg/dL — ABNORMAL HIGH (ref 70–99)
Glucose-Capillary: 187 mg/dL — ABNORMAL HIGH (ref 70–99)
Glucose-Capillary: 223 mg/dL — ABNORMAL HIGH (ref 70–99)
Glucose-Capillary: 394 mg/dL — ABNORMAL HIGH (ref 70–99)

## 2018-11-20 NOTE — Progress Notes (Signed)
Physical Therapy Treatment Patient Details Name: Andrew Larsen MRN: [id number] DOB: 08-Sep-1940 Today's Date: 11/20/2018    History of Present Illness Andrew Larsen is a 78 y.o. male with medical history significant of PAD, DM, HTN, BPH, OSA who presents due to SOB and nonproductive cough. Positive for Covid .  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 Reported multiple falls.    PT Comments    The patient did mobilize better today. Started out on 12 L HFNC but sats dropped to 70's when starting to sit up/. Switched to NRB to mobilize to recliner. Lowest 77%, HR 126, RR remained in 30's/  Patient is not able to progress ambulation due to pulmonary decompensation with miniaml activity.   Follow Up Recommendations  CIR;Home health PT;SNF     Equipment Recommendations  None recommended by PT    Recommendations for Other Services Rehab consult     Precautions / Restrictions Precautions Precautions: Fall Precaution Comments: Both legs are weak from unknown itology myopathy, must monitor VS, on HFNC at rest, switch to NRB if needed for mobility Restrictions Weight Bearing Restrictions: No    Mobility  Bed Mobility Overal bed mobility: Needs Assistance Bed Mobility: Supine to Sit     Supine to sit: Mod assist;+2 for safety/equipment     General bed mobility comments: assist for trunk elevation  Transfers Overall transfer level: Needs assistance Equipment used: Rolling walker (2 wheeled) Transfers: Sit to/from [employer] Transfers Sit to Stand: Min assist;+2 physical assistance;+2 safety/equipment;From elevated surface Stand pivot transfers: Min assist;+2 physical assistance;+2 safety/equipment       General transfer comment: minA to power up into standing at RW; steadying assist for stand pivot to recliner and to control descent   Ambulation/Gait                 Stairs             Wheelchair Mobility    Modified Rankin (Stroke Patients Only)       Balance Overall balance assessment: History of Falls;Needs assistance Sitting-balance support: Bilateral upper extremity supported;Feet supported Sitting balance-Leahy Scale: Good     Standing balance support: Bilateral upper extremity supported;During functional activity Standing balance-Leahy Scale: Poor Standing balance comment: Reliant on UE support                            Cognition Arousal/Alertness: Awake/alert Behavior During Therapy: WFL for tasks assessed/performed;Flat affect Overall Cognitive Status: Within Functional Limits for tasks assessed                                 General Comments: a little more cheerful today      Exercises      General Comments        Pertinent Vitals/Pain Pain Assessment: Faces Faces Pain Scale: Hurts little more Pain Location: chest from coughing Pain Descriptors / Indicators: Discomfort Pain Intervention(s): Monitored during session    Home Living                      Prior Function            PT Goals (current goals can now be found in the care plan section) Acute Rehab PT Goals Patient Stated Goal: to feel better, home soon Progress towards PT  goals: Progressing toward goals    Frequency    Min 3X/week      PT Plan Current plan remains appropriate    Co-evaluation PT/OT/SLP Co-Evaluation/Treatment: Yes Reason for Co-Treatment: Complexity of the patient's impairments (multi-system involvement);For patient/therapist safety PT goals addressed during session: Mobility/safety with mobility OT goals addressed during session: ADL's and self-care      AM-PAC PT "6 Clicks" Mobility   Outcome Measure  Help needed turning from your back to your side while in a flat bed without using bedrails?: A Little Help needed moving from lying on your back to sitting on the side  of a flat bed without using bedrails?: A Little Help needed moving to and from a bed to a chair (including a wheelchair)?: A Lot Help needed standing up from a chair using your arms (e.g., wheelchair or bedside chair)?: A Lot Help needed to walk in hospital room?: Total Help needed climbing 3-5 steps with a railing? : Total 6 Click Score: 12    End of Session Equipment Utilized During Treatment: Oxygen Activity Tolerance: Patient tolerated treatment well Patient left: in chair;with call bell/phone within reach Nurse Communication: Mobility status PT Visit Diagnosis: Unsteadiness on feet (R26.81);Muscle weakness (generalized) (M62.81);Repeated falls (R29.6)     Time: 1655-3748 PT Time Calculation (min) (ACUTE ONLY): 23 min  Charges:  $Therapeutic Activity: 8-22 mins                     Tresa Endo PT Acute Rehabilitation Services  Office [phone]    Claretha Cooper 11/20/2018, 2:09 PM

## 2018-11-20 NOTE — Progress Notes (Signed)
Occupational Therapy Treatment Patient Details Name: Andrew Larsen MRN: [id number] DOB: [birth year] Today's Date: 11/20/2018    History of present illness KREW HORTMAN is a 78 y.o. male with medical history significant of PAD, DM, HTN, BPH, OSA who presents due to SOB and nonproductive cough. Positive for Covid .  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 Reported multiple falls.   OT comments  Pt presents supine in bed agreeable to working with therapies. He tolerated OOB to chair during this session, utilizing NRB (15L) during activity. Two person assist utilized for transition to recliner, lowest sat noted 77% with activity, returned to mid 80s with seated rest and cues for deep breathing. Transitioned back to HFNC (12L) end of session with sats maintaining 86-88%. Pt appears in good spirits today and appreciative of working with therapies. Will continue per POC.   Follow Up Recommendations  SNF;CIR;Supervision/Assistance - 24 hour(pending progress)    Equipment Recommendations  3 in 1 bedside commode;Tub/shower seat          Precautions / Restrictions Precautions Precautions: Fall Precaution Comments: right LE is much weaker than left and may buckle, foot drop bilt, desats quickly Restrictions Weight Bearing Restrictions: No       Mobility Bed Mobility Overal bed mobility: Needs Assistance Bed Mobility: Supine to Sit     Supine to sit: Mod assist;+2 for safety/equipment     General bed mobility comments: assist for trunk elevation  Transfers Overall transfer level: Needs assistance Equipment used: Rolling walker (2 wheeled) Transfers: Sit to/from [facility] Sit to Stand: Min assist;+2 physical assistance;+2 safety/equipment;From elevated surface Stand pivot transfers: Min assist;+2 physical assistance;+2 safety/equipment       General  transfer comment: minA to power up into standing at RW; steadying assist for stand pivot to recliner and to control descent     Balance Overall balance assessment: History of Falls;Needs assistance Sitting-balance support: Bilateral upper extremity supported;Feet supported Sitting balance-Leahy Scale: Good     Standing balance support: Bilateral upper extremity supported;During functional activity Standing balance-Leahy Scale: Poor Standing balance comment: Reliant on UE support                           ADL either performed or assessed with clinical judgement   ADL Overall ADL's : Needs assistance/impaired Eating/Feeding: Independent;Sitting Eating/Feeding Details (indicate cue type and reason): switched to HFNC from NRB end of session to eat lunch Grooming: Wash/dry hands;Set up;Bed level                         Toileting - Clothing Manipulation Details (indicate cue type and reason): pt using urinal at bed level start of session with setup assist     Functional mobility during ADLs: Minimal assistance;+2 for physical assistance;+2 for safety/equipment;Rolling walker General ADL Comments: assisted pt with transfer OOB to recliner during this session for lunch and to promote upright      Cognition Arousal/Alertness: Awake/alert Behavior During Therapy: [facility] for tasks assessed/performed;Flat affect Overall Cognitive Status: Within Functional Limits for tasks assessed                                          Exercises     Shoulder Instructions  General Comments      Pertinent Vitals/ Pain       Pain Assessment: Faces Faces Pain Scale: Hurts little more Pain Location: chest from coughing Pain Descriptors / Indicators: Discomfort Pain Intervention(s): Monitored during session  Home Living                                          Prior Functioning/Environment              Frequency  Min 3X/week         Progress Toward Goals  OT Goals(current goals can now be found in the care plan section)  Progress towards OT goals: Progressing toward goals  Acute Rehab OT Goals Patient Stated Goal: to feel better, home soon OT Goal Formulation: With patient Time For Goal Achievement: 12/02/18 Potential to Achieve Goals: Good ADL Goals Pt Will Perform Grooming: with min guard assist;standing Pt Will Perform Lower Body Dressing: with min guard assist;with adaptive equipment;sit to/from stand Pt Will Transfer to Toilet: with min guard assist;bedside commode;ambulating Pt Will Perform Toileting - Clothing Manipulation and hygiene: with min guard assist;sit to/from stand;sitting/lateral leans Pt Will Perform Tub/Shower Transfer: with min assist;shower seat;ambulating;Tub transfer  Plan Discharge plan remains appropriate    Co-evaluation    PT/OT/SLP Co-Evaluation/Treatment: Yes Reason for Co-Treatment: Complexity of the patient's impairments (multi-system involvement);To address functional/ADL transfers   OT goals addressed during session: ADL's and self-care      AM-PAC OT "6 Clicks" Daily Activity     Outcome Measure   Help from another person eating meals?: None Help from another person taking care of personal grooming?: A Little Help from another person toileting, which includes using toliet, bedpan, or urinal?: A Lot Help from another person bathing (including washing, rinsing, drying)?: A Lot Help from another person to put on and taking off regular upper body clothing?: A Little Help from another person to put on and taking off regular lower body clothing?: A Lot 6 Click Score: 16    End of Session Equipment Utilized During Treatment: Oxygen;Rolling walker(NRB and HFNC)  OT Visit Diagnosis: Unsteadiness on feet (R26.81);Other abnormalities of gait and mobility (R26.89);Muscle weakness (generalized) (M62.81)   Activity Tolerance Patient tolerated treatment well    Patient Left in chair;with call bell/phone within reach   Nurse Communication Mobility status        Time: 9977-4142 OT Time Calculation (min): 23 min  Charges: OT General Charges $OT Visit: 1 Visit OT Treatments $Self Care/Home Management : 8-22 mins  Lou Cal, [place] Pager [phone] Office [phone]    Raymondo Band 11/20/2018, 1:21 PM

## 2018-11-20 NOTE — Progress Notes (Signed)
PROGRESS NOTE                                                                                                                                                                                                             Patient Demographics:    Andrew Larsen, is a 78 y.o. male, DOB - [birth year], SAY:[id number]  Outpatient Primary MD for the patient is Velna Hatchet, MD    LOS - 14  Chief Complaint  Patient presents with   Covid 19   Cough       Brief Narrative: Patient is a 78 y.o. male with PMHx of DM-2, HTN, BPH, PAD, OSA-who presented with dry cough, shortness of breath-was found to have acute hypoxic respiratory failure in the setting of COVID-19 pneumonia.  Hospital course complicated by worsening hypoxemia-necessitating transfer to the intensive care unit.  Upon further stability-he was transferred back to progressive care unit.   Subjective:    Andrew Larsen remains essentially the same compared to yesterday-still requiring 12-15 L of high flow oxygen to maintain O2 saturations.  He does acknowledge shortness of breath when he moves even slightly.   Assessment  & Plan :   Acute Hypoxic Resp Failure due to Covid 19 Viral pneumonia: Remains essentially the same compared to yesterday-remains significantly hypoxic and requires approximately 12-15 L of high flow oxygen to maintain O2 saturations.  Continue IV steroids-has completed a course of Remdesivir, and is s/p Actemra.  Inflammatory markers are slowly downtrending-but awaiting labs today.  He does not have obvious signs of volume overload on my exam, remains more than -8 L so far during this hospital stay.  Hold Lasix today again.  COVID-19 Labs:  Recent Labs    11/19/18 0609  DDIMER 2.88*  FERRITIN 363*  CRP <0.8    Lab Results  Component Value Date   SARSCOV2NAA POSITIVE (A) 11/03/2018     COVID-19 Medications: 7/10 and 7/11>> Actemra 7/11>>  convalescent plasma 7/10-7/14>> Remdesivir  Mild epistaxis: Occurred on 7/22-resolved.    Chronic diastolic heart failure: Clinically compensated-holding Lasix today.  Weight stable at 252 pounds (266 pounds on admission), -8 L so far  DM-2 (A1c 7.3 on 11/08/2018): CBGs currently stable-continue insulin 70/30 twice daily and SSI   HTN: Currently controlled-not on any antihypertensives-follow.  CAD: No anginal symptoms-continue aspirin  PAD: Appears stable-continue cilostazol and  aspirin  BPH: Continue Flomax  OSA: Unable to use CPAP-continue high flow oxygen  Diabetic peripheral neuropathy: Continue Neurontin  Recently diagnosed inclusion body myositis: Follow with neurology at [facility]-Dr. Warnell Forester ([phone]  Other-has left IJ CVL placed on 7/12-removed on 7/22, remains a hard stick-labs pending-hence placed order for PICC line.    ABG:    Component Value Date/Time   PHART 7.459 (H) 11/07/2018 1312   PCO2ART 30.5 (L) 11/07/2018 1312   PO2ART 46.0 (L) 11/07/2018 1312   HCO3 21.7 11/07/2018 1312   TCO2 23 11/07/2018 1312   ACIDBASEDEF 1.0 11/07/2018 1312   O2SAT 85.0 11/07/2018 1312    Condition - Extremely Guarded-remains very tenuous and at risk for further deterioration-daughter aware  Family Communication  : Spoke with patient's daughter over the phone on 7/23  Code Status :  Full Code  Diet :  Diet Order            Diet regular Room service appropriate? Yes; Fluid consistency: Thin  Diet effective now               Disposition Plan  :  Remain inpatient  Consults  :  PCCM  Procedures  :   7/10>> left IJ CVL  DVT Prophylaxis  :  Lovenox -watch d-dimer  Lab Results  Component Value Date   PLT 208 11/19/2018    Inpatient Medications  Scheduled Meds:  sodium chloride   Intravenous Once   aspirin  81 mg Oral QHS   bisacodyl  10 mg Oral Once   brimonidine  1 drop Both Eyes BID   Chlorhexidine Gluconate Cloth  6 each Topical Daily   cilostazol   100 mg Oral BID   enoxaparin (LOVENOX) injection  60 mg Subcutaneous Q24H   famotidine  20 mg Oral QHS   folic acid  1 mg Oral Daily   gabapentin  600 mg Oral TID   insulin aspart  0-20 Units Subcutaneous TID WC   insulin aspart protamine- aspart  20 Units Subcutaneous BID WC   Ipratropium-Albuterol  2 puff Inhalation QID   latanoprost  1 drop Both Eyes QHS   mouth rinse  15 mL Mouth Rinse BID   methylPREDNISolone (SOLU-MEDROL) injection  40 mg Intravenous Daily   oxybutynin  5 mg Oral BID   oxymetazoline  1 spray Each Nare BID   polyethylene glycol  17 g Oral Once   senna-docusate  2 tablet Oral BID   sodium chloride flush  3 mL Intravenous Q12H   tamsulosin  0.4 mg Oral Daily   thiamine  100 mg Oral Daily   timolol  1 drop Both Eyes BID   vitamin C  500 mg Oral Daily   zinc sulfate  220 mg Oral Daily   Continuous Infusions: PRN Meds:.acetaminophen, benzonatate, chlorpheniramine-HYDROcodone, guaiFENesin-dextromethorphan, ondansetron **OR** ondansetron (ZOFRAN) IV, senna-docusate, sodium chloride  Antibiotics  :    Anti-infectives (From admission, onward)   Start     Dose/Rate Route Frequency Ordered Stop   11/08/18 1330  remdesivir 100 mg in sodium chloride 0.9 % 250 mL IVPB     100 mg 500 mL/hr over 30 Minutes Intravenous Every 24 hours 11/07/18 1229 11/11/18 1304   11/07/18 1330  remdesivir 200 mg in sodium chloride 0.9 % 250 mL IVPB     200 mg 500 mL/hr over 30 Minutes Intravenous Once 11/07/18 1229 11/08/18 1529       Time Spent in minutes  25   Oren Binet M.D on 11/20/2018  at 11:35 AM  To page go to [email or web address] - use universal password  [facility] -  Office  [phone]   See all Orders from today for further details   Admit date - 11/03/2018    15    Objective:   Vitals:   11/19/18 2330 11/20/18 0345 11/20/18 0500 11/20/18 0753  BP: (!) 120/58 117/68    Pulse: 94 94 94 92  Resp: 18 (!) 23 11 20   Temp: 98.1 F (36.7  C) 97.6 F (36.4 C)    TempSrc: Oral Oral    SpO2: 94% 92% 100% 98%  Weight:   114.7 kg   Height:        Wt Readings from Last 3 Encounters:  11/20/18 114.7 kg  05/06/18 117.9 kg  04/28/18 122.8 kg     Intake/Output Summary (Last 24 hours) at 11/20/2018 1135 Last data filed at 11/20/2018 0345 Gross per 24 hour  Intake --  Output 450 ml  Net -450 ml     Physical Exam Physical Exam Gen Exam:Alert awake-not in any distress HEENT:atraumatic, normocephalic Chest: B/L clear to auscultation anteriorly CVS:S1S2 regular Abdomen:soft non tender, non distended Extremities:no edema Neurology: Non focal Skin: no rash   Data Review:    CBC Recent Labs  Lab 11/14/18 0500 11/15/18 0120 11/16/18 0500 11/17/18 0500 11/19/18 0609  WBC 20.7* 24.3* 18.6* 19.6* 14.1*  HGB 16.4 16.7 16.1 16.1 16.1  HCT 49.2 49.3 48.3 47.4 48.6  PLT 343 355 283 260 208  MCV 90.3 88.5 89.6 89.4 90.0  MCH 30.1 30.0 29.9 30.4 29.8  MCHC 33.3 33.9 33.3 34.0 33.1  RDW 13.7 13.5 13.5 13.6 13.8  LYMPHSABS 2.2 1.9 2.0 1.9  --   MONOABS 1.8* 1.8* 1.6* 1.8*  --   EOSABS 0.1 0.0 0.1 0.1  --   BASOSABS 0.1 0.1 0.1 0.0  --     Chemistries  Recent Labs  Lab 11/14/18 0500 11/15/18 0120 11/16/18 0500 11/17/18 0500 11/19/18 0609  NA 135 132* 133* 132* 134*  K 4.4 4.4 4.8 4.9 4.5  CL 101 98 99 98 96*  CO2 24 21* 25 25 28   GLUCOSE 94 74 60* 94 108*  BUN 48* 50* 41* 40* 41*  CREATININE 1.58* 1.64* 1.49* 1.62* 1.55*  CALCIUM 8.9 8.8* 8.5* 8.9 9.1  AST 42* 36 34 30 31  ALT 91* 79* 64* 56* 61*  ALKPHOS 102 106 108 111 95  BILITOT 0.6 0.5 0.5 0.9 0.8   ------------------------------------------------------------------------------------------------------------------ No results for input(s): CHOL, HDL, LDLCALC, TRIG, CHOLHDL, LDLDIRECT in the last 72 hours.  Lab Results  Component Value Date   HGBA1C 7.3 (H) 11/08/2018    ------------------------------------------------------------------------------------------------------------------ No results for input(s): TSH, T4TOTAL, T3FREE, THYROIDAB in the last 72 hours.  Invalid input(s): FREET3 ------------------------------------------------------------------------------------------------------------------ Recent Labs    11/19/18 0609  FERRITIN 363*    Coagulation profile No results for input(s): INR, PROTIME in the last 168 hours.  Recent Labs    11/19/18 0609  DDIMER 2.88*    Cardiac Enzymes No results for input(s): CKMB, TROPONINI, MYOGLOBIN in the last 168 hours.  Invalid input(s): CK ------------------------------------------------------------------------------------------------------------------    Component Value Date/Time   BNP 15.9 09/19/2015 1030    Micro Results No results found for this or any previous visit (from the past 240 hour(s)).  Radiology Reports Dg Chest Port 1 View  Result Date: 11/18/2018 CLINICAL DATA:  Hypoxia. EXAM: PORTABLE CHEST 1 VIEW COMPARISON:  11/13/2018. FINDINGS: Left IJ line noted stable position. Cardiomegaly. Diffuse  bilateral severe pulmonary infiltrates/edema. No pleural effusion or pneumothorax. IMPRESSION: 1.  Left IJ line noted stable position. 2. Cardiomegaly. Diffuse bilateral severe pulmonary infiltrates/edema again noted. No interim change. Electronically Signed   By: [place]   On: 11/18/2018 16:16   Dg Chest Port 1 View  Result Date: 11/13/2018 CLINICAL DATA:  Acute respiratory failure with hypoxia. EXAM: PORTABLE CHEST 1 VIEW COMPARISON:  11/11/2018 FINDINGS: Left IJ central venous catheter unchanged with tip obliquely oriented over the junction of the brachiocephalic vein to SVC. Patient is rotated to the left. Lungs are hypoinflated demonstrate interval worsening of hazy central airspace opacification right worse than left. No definite effusion. Mild stable cardiomegaly. Remainder of  the exam is unchanged. IMPRESSION: Interval worsening bilateral hazy central airspace opacification right worse than left likely asymmetric interstitial edema and less likely infection. Stable cardiomegaly.  Left IJ central venous catheter unchanged. Electronically Signed   By: Marin Olp M.D.   On: 11/13/2018 08:59   Dg Chest Port 1 View  Result Date: 11/11/2018 CLINICAL DATA:  Acute respiratory failure due to hypoxemia. EXAM: PORTABLE CHEST 1 VIEW COMPARISON:  Radiograph November 10, 2018. FINDINGS: Stable cardiomediastinal silhouette. Left internal jugular catheter is unchanged in position. No pneumothorax or significant pleural effusion is noted. Stable bilateral lung opacities are noted concerning for edema or pneumonia. Bony thorax is unremarkable. IMPRESSION: Stable bilateral lung opacities as described above. Electronically Signed   By: Marijo Conception M.D.   On: 11/11/2018 07:34   Dg Chest Port 1 View  Result Date: 11/10/2018 CLINICAL DATA:  Pneumatocele of lung EXAM: PORTABLE CHEST 1 VIEW COMPARISON:  Radiograph 11/10/2018 FINDINGS: Central venous line with tip in the distal brachiocephalic vein unchanged. Stable cardiac silhouette. Fine multifocal airspace disease is slightly increased in density compared to prior as noted over the RIGHT hemidiaphragm and LEFT upper lobe. Low lung volumes. IMPRESSION: Mild increase in multifocal fine airspace disease. Electronically Signed   By: Suzy Bouchard M.D.   On: 11/10/2018 10:32   Dg Chest Port 1 View  Result Date: 11/10/2018 CLINICAL DATA:  78 year old male with COVID-19 pneumonia EXAM: PORTABLE CHEST 1 VIEW COMPARISON:  Prior chest x-ray 11/07/2018 FINDINGS: Stable position of left IJ central venous catheter. The catheter tip overlies the innominate vein. Improving appearance of multifocal patchy interstitial and airspace opacities bilaterally. There is a region of focal clearing in the left mid lung just superior to the cardiac margin which could  represent a developing pneumatocele. No acute osseous abnormality. IMPRESSION: 1. Improving appearance of bilateral multifocal interstitial and airspace opacities compared to 11/07/2018. 2. Region of more focal and slightly rounded lucency in the left mid lung could represent a developing pneumatocele versus a region of more advanced clearing. 3. Stable position of left IJ central venous catheter. Electronically Signed   By: Jacqulynn Cadet M.D.   On: 11/10/2018 08:01   Dg Chest Port 1 View  Result Date: 11/07/2018 CLINICAL DATA:  Central line placement, confirm positioning. EXAM: PORTABLE CHEST 1 VIEW COMPARISON:  Numerous chest radiographs, most recently November 07, 2018, CTA chest Sep 19, 2015. FINDINGS: Interval placement of a left internal jugular approach central venous catheter. Catheter tip terminates near the confluence of the brachiocephalic vein and superior vena cava, could be advanced to insure positioning within the lower SVC/cavoatrial junction. Widespread bilateral airspace opacities are again seen most pronounced in the right mid and lower lung. No pneumothorax or effusion is present. Cardiac silhouette remains enlarged. Cardiomediastinal contours are essentially unchanged from  prior studies. No acute osseous or soft tissue abnormality. IMPRESSION: 1. Interval placement of a left internal jugular approach central venous catheter. Catheter tip terminates near the confluence of the brachiocephalic vein and superior vena cava, could be advanced 2-3 cm to ensure positioning within the lower SVC/cavoatrial junction. 2. No procedural complication identified. 3. Unchanged diffuse bilateral airspace disease. Electronically Signed   By: MD Lovena Le   On: 11/07/2018 19:06   Dg Chest Port 1 View  Result Date: 11/07/2018 CLINICAL DATA:  Shortness of breath EXAM: PORTABLE CHEST 1 VIEW COMPARISON:  November 05, 2018 FINDINGS: The heart size and mediastinal contours are stable. The heart size is enlarged.  Hazy opacities are identified in the right lung and to a lesser degree in the left lung, worse compared to prior exam. There is no pleural effusion. The visualized skeletal structures are unremarkable. IMPRESSION: Bilateral pulmonary infiltrates are slightly worse compared to prior exam of November 05, 2018. Electronically Signed   By: Abelardo Diesel M.D.   On: 11/07/2018 12:10   Dg Chest Port 1 View  Result Date: 11/05/2018 CLINICAL DATA:  Acute respiratory disease secondary to COVID-19. EXAM: PORTABLE CHEST 1 VIEW 5:11 a.m. COMPARISON:  11/03/2018 and 03/25/2016 FINDINGS: Faint bilateral pulmonary infiltrates appear slightly improved at the bases. Heart size and pulmonary vascularity are normal. No effusions. No bone abnormality. IMPRESSION: Slight improvement in faint bilateral pulmonary infiltrates. Electronically Signed   By: Lorriane Shire M.D.   On: 11/05/2018 05:50   Dg Chest Port 1 View  Result Date: 11/03/2018 CLINICAL DATA:  Cough.  COVID-19. EXAM: PORTABLE CHEST 1 VIEW COMPARISON:  03/25/2016. FINDINGS: Mediastinum hilar structures normal. Cardiomegaly with mild pulmonary venous congestion and bilateral interstitial prominence. CHF could present this fashion. Pneumonitis particularly in light of the patient's history of COVID-19 infection could also present this fashion. Small left pleural effusion cannot be excluded. IMPRESSION: 1. Cardiomegaly with mild pulmonary venous congestion and bilateral interstitial prominence. CHF could present in this fashion. Pneumonitis particularly in light of the patient's history of COVID-19 infection could also present this fashion. 2.  Small left pleural effusion cannot be excluded. Electronically Signed   By: Marcello Moores  Register   On: 11/03/2018 14:31   Dg Chest Port 1v Same Day  Result Date: 11/19/2018 CLINICAL DATA:  Pneumonia in a patient who is COVID-19 positive. EXAM: PORTABLE CHEST 1 VIEW COMPARISON:  Single-view of the chest 11/18/2018 and 11/13/2018.  FINDINGS: Left IJ catheter is unchanged. Extensive bilateral airspace disease persists without change given differences in technique. Heart size is normal. No pneumothorax or pleural effusion. IMPRESSION: No notable change in extensive bilateral airspace disease. Electronically Signed   By: Inge Rise M.D.   On: 11/19/2018 11:51   [place] Ekg Site Rite  Result Date: 11/19/2018 If Site Rite image not attached, placement could not be confirmed due to current cardiac rhythm.

## 2018-11-20 NOTE — TOC Progression Note (Signed)
Transition of Care [facility]) - Progression Note    Patient Details  Name: Andrew Larsen MRN: [id number] Date of Birth: 06-12-1940  Transition of Care [facility]) CM/SW [place] RN, BSN, NCM-BC, ACM-RN [phone] (working remotely) Phone Number: 11/20/2018, 1:43 PM  Clinical Narrative:    CM following for dispositional needs. Patient continues to require HFNC to maintain O2 sats. PT/OT recommending CIR vs SNF, with patient not medically stable to transition to the next LOC. Patient will require 2 negative COVID tests and be fever free for 3 days with no anti-pyretics for CIR. CM team will continue to follow.     Expected Discharge Plan: Skilled Nursing Facility(CIR vs SNF) Barriers to Discharge: Continued Medical Work up(still requiring 12-15 L of high flow oxygen to maintain O2 sats)  Expected Discharge Plan and Services Expected Discharge Plan: Skilled Nursing Facility(CIR vs SNF)   Discharge Planning Services: CM Consult Post Acute Care Choice: Durable Medical Equipment   Expected Discharge Date: 11/05/18               DME Arranged: Bedside commode DME Agency: [facility] Date DME Agency Contacted: 11/07/18 Time DME Agency Contacted: 1100 Representative spoke with at DME Agency: Learta Codding HH Arranged: PT, OT [facility] Agency: [facility]         Social Determinants of Health (SDOH) Interventions    Readmission Risk Interventions No flowsheet data found.

## 2018-11-21 ENCOUNTER — Inpatient Hospital Stay (HOSPITAL_COMMUNITY): Payer: Medicare HMO

## 2018-11-21 DIAGNOSIS — M7989 Other specified soft tissue disorders: Secondary | ICD-10-CM

## 2018-11-21 DIAGNOSIS — R609 Edema, unspecified: Secondary | ICD-10-CM

## 2018-11-21 LAB — BASIC METABOLIC PANEL
Anion gap: 15 (ref 5–15)
BUN: 39 mg/dL — ABNORMAL HIGH (ref 8–23)
CO2: 24 mmol/L (ref 22–32)
Calcium: 9.1 mg/dL (ref 8.9–10.3)
Chloride: 92 mmol/L — ABNORMAL LOW (ref 98–111)
Creatinine, Ser: 1.61 mg/dL — ABNORMAL HIGH (ref 0.61–1.24)
GFR calc Af Amer: 47 mL/min — ABNORMAL LOW (ref >60)
GFR calc non Af Amer: 41 mL/min — ABNORMAL LOW (ref >60)
Glucose, Bld: 348 mg/dL — ABNORMAL HIGH (ref 70–99)
Potassium: 4.9 mmol/L (ref 3.5–5.1)
Sodium: 131 mmol/L — ABNORMAL LOW (ref 135–145)

## 2018-11-21 LAB — GLUCOSE, CAPILLARY
Glucose-Capillary: 349 mg/dL — ABNORMAL HIGH (ref 70–99)
Glucose-Capillary: 180 mg/dL — ABNORMAL HIGH (ref 70–99)
Glucose-Capillary: 165 mg/dL — ABNORMAL HIGH (ref 70–99)
Glucose-Capillary: 255 mg/dL — ABNORMAL HIGH (ref 70–99)
Glucose-Capillary: 333 mg/dL — ABNORMAL HIGH (ref 70–99)
Glucose-Capillary: 260 mg/dL — ABNORMAL HIGH (ref 70–99)

## 2018-11-21 LAB — COMPREHENSIVE METABOLIC PANEL
ALT: 53 U/L — ABNORMAL HIGH (ref 0–44)
AST: 26 U/L (ref 15–41)
Albumin: 3.3 g/dL — ABNORMAL LOW (ref 3.5–5.0)
Alkaline Phosphatase: 90 U/L (ref 38–126)
Anion gap: 9 (ref 5–15)
BUN: 36 mg/dL — ABNORMAL HIGH (ref 8–23)
CO2: 25 mmol/L (ref 22–32)
Calcium: 8.7 mg/dL — ABNORMAL LOW (ref 8.9–10.3)
Chloride: 97 mmol/L — ABNORMAL LOW (ref 98–111)
Creatinine, Ser: 1.45 mg/dL — ABNORMAL HIGH (ref 0.61–1.24)
GFR calc Af Amer: 54 mL/min — ABNORMAL LOW (ref >60)
GFR calc non Af Amer: 46 mL/min — ABNORMAL LOW (ref >60)
Glucose, Bld: 175 mg/dL — ABNORMAL HIGH (ref 70–99)
Potassium: 6 mmol/L — ABNORMAL HIGH (ref 3.5–5.1)
Sodium: 131 mmol/L — ABNORMAL LOW (ref 135–145)
Total Bilirubin: 0.7 mg/dL (ref 0.3–1.2)
Total Protein: 5.8 g/dL — ABNORMAL LOW (ref 6.5–8.1)

## 2018-11-21 LAB — CBC
HCT: 49.9 % (ref 39.0–52.0)
Hemoglobin: 16.2 g/dL (ref 13.0–17.0)
MCH: 29.9 pg (ref 26.0–34.0)
MCHC: 32.5 g/dL (ref 30.0–36.0)
MCV: 92.1 fL (ref 80.0–100.0)
Platelets: 160 10*3/uL (ref 150–400)
RBC: 5.42 MIL/uL (ref 4.22–5.81)
RDW: 14.1 % (ref 11.5–15.5)
WBC: 8.9 10*3/uL (ref 4.0–10.5)
nRBC: 0 % (ref 0.0–0.2)

## 2018-11-21 LAB — FERRITIN: Ferritin: 396 ng/mL — ABNORMAL HIGH (ref 24–336)

## 2018-11-21 LAB — D-DIMER, QUANTITATIVE: D-Dimer, Quant: 2.96 ug/mL-FEU — ABNORMAL HIGH (ref 0.00–0.50)

## 2018-11-21 LAB — BRAIN NATRIURETIC PEPTIDE: B Natriuretic Peptide: 29.4 pg/mL (ref 0.0–100.0)

## 2018-11-21 LAB — C-REACTIVE PROTEIN: CRP: <0.8 mg/dL (ref <1.0)

## 2018-11-21 LAB — PROCALCITONIN: Procalcitonin: <0.1 ng/mL

## 2018-11-21 MED ORDER — SODIUM CHLORIDE 0.9% FLUSH: 10.0000 mL | INTRAVENOUS | Status: DC | PRN

## 2018-11-21 MED ORDER — DEXTROSE 50 % IV SOLN
25.0000 mL | Freq: Once | INTRAVENOUS | Status: AC
  Administered 2018-11-21: 25 mL via INTRAVENOUS
  Filled 2018-11-21: qty 50

## 2018-11-21 MED ORDER — SODIUM ZIRCONIUM CYCLOSILICATE 10 G PO PACK
10.0000 g | PACK | Freq: Every day | ORAL | Status: AC
  Administered 2018-11-21: 10 g via ORAL
  Filled 2018-11-21: qty 1

## 2018-11-21 MED ORDER — INSULIN ASPART 100 UNIT/ML ~~LOC~~ SOLN
6.0000 [IU] | Freq: Once | SUBCUTANEOUS | Status: AC
  Administered 2018-11-21: 6 [IU] via INTRAVENOUS

## 2018-11-21 MED ORDER — FUROSEMIDE 10 MG/ML IJ SOLN
60.0000 mg | Freq: Once | INTRAMUSCULAR | Status: AC
  Administered 2018-11-21: 60 mg via INTRAVENOUS
  Filled 2018-11-21: qty 6

## 2018-11-21 MED ORDER — INSULIN REGULAR HUMAN 100 UNIT/ML IJ SOLN: 6.0000 [IU] | Freq: Once | INTRAMUSCULAR | Status: DC

## 2018-11-21 MED ORDER — DEXTROSE 50 % IV SOLN: 25.0000 mL | Freq: Once | INTRAVENOUS | Status: DC

## 2018-11-21 MED ORDER — SODIUM CHLORIDE 0.9% FLUSH
10.0000 mL | Freq: Two times a day (BID) | INTRAVENOUS | Status: DC
  Administered 2018-11-21 – 2018-11-22 (×4): 10 mL
  Administered 2018-11-23: 20 mL
  Administered 2018-11-23 – 2018-11-30 (×15): 10 mL

## 2018-11-21 NOTE — Progress Notes (Signed)
Peripherally Inserted Central Catheter/Midline Placement  The IV Nurse has discussed with the patient and/or persons authorized to consent for the patient, the purpose of this procedure and the potential benefits and risks involved with this procedure.  The benefits include less needle sticks, lab draws from the catheter, and the patient may be discharged home with the catheter. Risks include, but not limited to, infection, bleeding, blood clot (thrombus formation), and puncture of an artery; nerve damage and irregular heartbeat and possibility to perform a PICC exchange if needed/ordered by physician.  Alternatives to this procedure were also discussed.  Bard Power PICC patient education guide, fact sheet on infection prevention and patient information card has been provided to patient /or left at bedside.    PICC/Midline Placement Documentation  PICC Single Lumen 68/34/19 PICC Right Basilic 45 cm 1 cm (Active)  Indication for Insertion or Continuance of Line Prolonged intravenous therapies 11/21/18 0900  Exposed Catheter (cm) 1 cm 11/21/18 0900  Site Assessment Clean;Dry;Intact 11/21/18 0900  Line Status Flushed;Saline locked;Blood return noted 11/21/18 0900  Dressing Type Transparent;Securing device 11/21/18 0900  Dressing Status Clean;Dry;Intact;Antimicrobial disc in place 11/21/18 0900  Line Care Connections checked and tightened 11/21/18 0900  Dressing Intervention New dressing 11/21/18 0900  Dressing Change Due 11/28/18 11/21/18 0900       Virgilio Belling 11/21/2018, 9:28 AM

## 2018-11-21 NOTE — Progress Notes (Addendum)
PROGRESS NOTE                                                                                                                                                                                                             Patient Demographics:    Andrew Larsen, is a 78 y.o. male, DOB - 01/19/1941, VEH:[id number]  Outpatient Primary MD for the patient is Velna Hatchet, MD    LOS - 57  Chief Complaint  Patient presents with   Covid 19   Cough       Brief Narrative: Patient is a 78 y.o. male with PMHx of DM-2, HTN, BPH, PAD, OSA-who presented with dry cough, shortness of breath-was found to have acute hypoxic respiratory failure in the setting of COVID-19 pneumonia.  Hospital course complicated by worsening hypoxemia-necessitating transfer to the intensive care unit.  Upon further stability-he was transferred back to progressive care unit.   Subjective:    Andrew Larsen remains essentially the same-still on 12-14 L of oxygen via high flow.  No epistaxis.  Remains without any further acute issues overnight per RN.   Assessment  & Plan :   Acute Hypoxic Resp Failure due to Covid 19 Viral pneumonia: Remains essentially the same-still requiring 12-40 L of oxygen via high flow.  Continue IV steroids-although inflammatory markers have down trended significantly.  Has completed a course of Remdesivir and is s/p Actemra.  Will give 1 dose of IV Lasix today-continue to maintain negative balance as much as possible.  Encourage ambulation, out of bed to chair-incentive spirometry and other supportive care.  COVID-19 Labs:  Recent Labs    11/19/18 0609 11/21/18 0345  DDIMER 2.88* 2.96*  FERRITIN 363* 396*  CRP <0.8 <0.8    Lab Results  Component Value Date   SARSCOV2NAA POSITIVE (A) 11/03/2018     COVID-19 Medications: 7/10 and 7/11>> Actemra 7/11>> convalescent plasma 7/10-7/14>> Remdesivir  Mild epistaxis: Occurred  on 7/22-resolved.    Hyperkalemia: Check EKG-we will treat with NovoLog/dextrose-and will give 1 dose of Lokelma.  Repeat electrolytes later today.  Chronic diastolic heart failure: Clinically compensated-we will give 1 dose of Lasix today-although no signs of volume overload-we will like to maintain negative balance as much as possible  DM-2 (A1c 7.3 on 11/08/2018): CBGs currently stable-continue insulin 70/30 twice daily and SSI   HTN: Currently controlled-not  on any antihypertensives-follow.  CAD: No anginal symptoms-continue aspirin  PAD: Appears stable-continue cilostazol and aspirin  BPH: Continue Flomax  OSA: Unable to use CPAP-continue high flow oxygen  Diabetic peripheral neuropathy: Continue Neurontin  Recently diagnosed inclusion body myositis: Follow with neurology at [facility]-Dr. Warnell Forester ([id number]  ABG:    Component Value Date/Time   PHART 7.459 (H) 11/07/2018 1312   PCO2ART 30.5 (L) 11/07/2018 1312   PO2ART 46.0 (L) 11/07/2018 1312   HCO3 21.7 11/07/2018 1312   TCO2 23 11/07/2018 1312   ACIDBASEDEF 1.0 11/07/2018 1312   O2SAT 85.0 11/07/2018 1312    Condition - Extremely Guarded-remains very tenuous and at risk for further deterioration-daughter aware  Family Communication  : Spoke with patient's daughter over the phone on 7/24  Code Status :  Full Code  Diet :  Diet Order            Diet regular Room service appropriate? Yes; Fluid consistency: Thin  Diet effective now               Disposition Plan  :  Remain inpatient  Consults  :  PCCM  Procedures  :   7/10>>7/22 left IJ CVL 7/24>>PICC  DVT Prophylaxis  :  Lovenox -watch d-dimer  Lab Results  Component Value Date   PLT 160 11/21/2018    Inpatient Medications  Scheduled Meds:  sodium chloride   Intravenous Once   aspirin  81 mg Oral QHS   bisacodyl  10 mg Oral Once   brimonidine  1 drop Both Eyes BID   Chlorhexidine Gluconate Cloth  6 each Topical Daily   cilostazol  100 mg  Oral BID   enoxaparin (LOVENOX) injection  60 mg Subcutaneous Q24H   famotidine  20 mg Oral QHS   folic acid  1 mg Oral Daily   furosemide  60 mg Intravenous Once   gabapentin  600 mg Oral TID   insulin aspart  0-20 Units Subcutaneous TID WC   insulin aspart protamine- aspart  20 Units Subcutaneous BID WC   Ipratropium-Albuterol  2 puff Inhalation QID   latanoprost  1 drop Both Eyes QHS   mouth rinse  15 mL Mouth Rinse BID   methylPREDNISolone (SOLU-MEDROL) injection  40 mg Intravenous Daily   oxybutynin  5 mg Oral BID   polyethylene glycol  17 g Oral Once   senna-docusate  2 tablet Oral BID   sodium chloride flush  10-40 mL Intracatheter Q12H   sodium chloride flush  3 mL Intravenous Q12H   sodium zirconium cyclosilicate  10 g Oral Daily   tamsulosin  0.4 mg Oral Daily   thiamine  100 mg Oral Daily   timolol  1 drop Both Eyes BID   vitamin C  500 mg Oral Daily   zinc sulfate  220 mg Oral Daily   Continuous Infusions: PRN Meds:.acetaminophen, benzonatate, chlorpheniramine-HYDROcodone, guaiFENesin-dextromethorphan, ondansetron **OR** ondansetron (ZOFRAN) IV, senna-docusate, sodium chloride, sodium chloride flush  Antibiotics  :    Anti-infectives (From admission, onward)   Start     Dose/Rate Route Frequency Ordered Stop   11/08/18 1330  remdesivir 100 mg in sodium chloride 0.9 % 250 mL IVPB     100 mg 500 mL/hr over 30 Minutes Intravenous Every 24 hours 11/07/18 1229 11/11/18 1304   11/07/18 1330  remdesivir 200 mg in sodium chloride 0.9 % 250 mL IVPB     200 mg 500 mL/hr over 30 Minutes Intravenous Once 11/07/18 1229 11/08/18 1529  Time Spent in minutes  25   Oren Binet M.D on 11/21/2018 at 11:30 AM  To page go to [email or web address] - use universal password  [facility] -  Office  [phone]   See all Orders from today for further details   Admit date - 11/03/2018    16    Objective:   Vitals:   11/20/18 2325 11/21/18 0340  11/21/18 0500 11/21/18 0805  BP: 93/63 (!) 97/49  (!) 132/91  Pulse: 95 94  94  Resp: (!) 28 20  18   Temp: 98.4 F (36.9 C) 98.7 F (37.1 C)  (!) 97.5 F (36.4 C)  TempSrc: Oral Oral  Oral  SpO2: 96% 97%  94%  Weight:   113.9 kg   Height:        Wt Readings from Last 3 Encounters:  11/21/18 113.9 kg  05/06/18 117.9 kg  04/28/18 122.8 kg     Intake/Output Summary (Last 24 hours) at 11/21/2018 1130 Last data filed at 11/21/2018 0800 Gross per 24 hour  Intake 240 ml  Output 700 ml  Net -460 ml     Physical Exam  Gen Exam:Alert awake-not in any distress HEENT:atraumatic, normocephalic Chest: B/L clear to auscultation anteriorly CVS:S1S2 regular Abdomen:soft non tender, non distended Extremities:no edema Neurology: Non focal Skin: no rash   Data Review:    CBC Recent Labs  Lab 11/15/18 0120 11/16/18 0500 11/17/18 0500 11/19/18 0609 11/21/18 0345  WBC 24.3* 18.6* 19.6* 14.1* 8.9  HGB 16.7 16.1 16.1 16.1 16.2  HCT 49.3 48.3 47.4 48.6 49.9  PLT 355 283 260 208 160  MCV 88.5 89.6 89.4 90.0 92.1  MCH 30.0 29.9 30.4 29.8 29.9  MCHC 33.9 33.3 34.0 33.1 32.5  RDW 13.5 13.5 13.6 13.8 14.1  LYMPHSABS 1.9 2.0 1.9  --   --   MONOABS 1.8* 1.6* 1.8*  --   --   EOSABS 0.0 0.1 0.1  --   --   BASOSABS 0.1 0.1 0.0  --   --     Chemistries  Recent Labs  Lab 11/15/18 0120 11/16/18 0500 11/17/18 0500 11/19/18 0609 11/21/18 0345  NA 132* 133* 132* 134* 131*  K 4.4 4.8 4.9 4.5 6.0*  CL 98 99 98 96* 97*  CO2 21* 25 25 28 25   GLUCOSE 74 60* 94 108* 175*  BUN 50* 41* 40* 41* 36*  CREATININE 1.64* 1.49* 1.62* 1.55* 1.45*  CALCIUM 8.8* 8.5* 8.9 9.1 8.7*  AST 36 34 30 31 26   ALT 79* 64* 56* 61* 53*  ALKPHOS 106 108 111 95 90  BILITOT 0.5 0.5 0.9 0.8 0.7   ------------------------------------------------------------------------------------------------------------------ No results for input(s): CHOL, HDL, LDLCALC, TRIG, CHOLHDL, LDLDIRECT in the last 72 hours.  Lab  Results  Component Value Date   HGBA1C 7.3 (H) 11/08/2018   ------------------------------------------------------------------------------------------------------------------ No results for input(s): TSH, T4TOTAL, T3FREE, THYROIDAB in the last 72 hours.  Invalid input(s): FREET3 ------------------------------------------------------------------------------------------------------------------ Recent Labs    11/19/18 0609 11/21/18 0345  FERRITIN 363* 396*    Coagulation profile No results for input(s): INR, PROTIME in the last 168 hours.  Recent Labs    11/19/18 0609 11/21/18 0345  DDIMER 2.88* 2.96*    Cardiac Enzymes No results for input(s): CKMB, TROPONINI, MYOGLOBIN in the last 168 hours.  Invalid input(s): CK ------------------------------------------------------------------------------------------------------------------    Component Value Date/Time   BNP 29.4 11/21/2018 0345    Micro Results No results found for this or any previous visit (from the past 240 hour(s)).  Radiology Reports Dg Chest Port 1 View  Result Date: 11/18/2018 CLINICAL DATA:  Hypoxia. EXAM: PORTABLE CHEST 1 VIEW COMPARISON:  11/13/2018. FINDINGS: Left IJ line noted stable position. Cardiomegaly. Diffuse bilateral severe pulmonary infiltrates/edema. No pleural effusion or pneumothorax. IMPRESSION: 1.  Left IJ line noted stable position. 2. Cardiomegaly. Diffuse bilateral severe pulmonary infiltrates/edema again noted. No interim change. Electronically Signed   By: Butler   On: 11/18/2018 16:16   Dg Chest Port 1 View  Result Date: 11/13/2018 CLINICAL DATA:  Acute respiratory failure with hypoxia. EXAM: PORTABLE CHEST 1 VIEW COMPARISON:  11/11/2018 FINDINGS: Left IJ central venous catheter unchanged with tip obliquely oriented over the junction of the brachiocephalic vein to SVC. Patient is rotated to the left. Lungs are hypoinflated demonstrate interval worsening of hazy central airspace  opacification right worse than left. No definite effusion. Mild stable cardiomegaly. Remainder of the exam is unchanged. IMPRESSION: Interval worsening bilateral hazy central airspace opacification right worse than left likely asymmetric interstitial edema and less likely infection. Stable cardiomegaly.  Left IJ central venous catheter unchanged. Electronically Signed   By: Marin Olp M.D.   On: 11/13/2018 08:59   Dg Chest Port 1 View  Result Date: 11/11/2018 CLINICAL DATA:  Acute respiratory failure due to hypoxemia. EXAM: PORTABLE CHEST 1 VIEW COMPARISON:  Radiograph November 10, 2018. FINDINGS: Stable cardiomediastinal silhouette. Left internal jugular catheter is unchanged in position. No pneumothorax or significant pleural effusion is noted. Stable bilateral lung opacities are noted concerning for edema or pneumonia. Bony thorax is unremarkable. IMPRESSION: Stable bilateral lung opacities as described above. Electronically Signed   By: Marijo Conception M.D.   On: 11/11/2018 07:34   Dg Chest Port 1 View  Result Date: 11/10/2018 CLINICAL DATA:  Pneumatocele of lung EXAM: PORTABLE CHEST 1 VIEW COMPARISON:  Radiograph 11/10/2018 FINDINGS: Central venous line with tip in the distal brachiocephalic vein unchanged. Stable cardiac silhouette. Fine multifocal airspace disease is slightly increased in density compared to prior as noted over the RIGHT hemidiaphragm and LEFT upper lobe. Low lung volumes. IMPRESSION: Mild increase in multifocal fine airspace disease. Electronically Signed   By: Suzy Bouchard M.D.   On: 11/10/2018 10:32   Dg Chest Port 1 View  Result Date: 11/10/2018 CLINICAL DATA:  78 year old male with COVID-19 pneumonia EXAM: PORTABLE CHEST 1 VIEW COMPARISON:  Prior chest x-ray 11/07/2018 FINDINGS: Stable position of left IJ central venous catheter. The catheter tip overlies the innominate vein. Improving appearance of multifocal patchy interstitial and airspace opacities bilaterally. There  is a region of focal clearing in the left mid lung just superior to the cardiac margin which could represent a developing pneumatocele. No acute osseous abnormality. IMPRESSION: 1. Improving appearance of bilateral multifocal interstitial and airspace opacities compared to 11/07/2018. 2. Region of more focal and slightly rounded lucency in the left mid lung could represent a developing pneumatocele versus a region of more advanced clearing. 3. Stable position of left IJ central venous catheter. Electronically Signed   By: Jacqulynn Cadet M.D.   On: 11/10/2018 08:01   Dg Chest Port 1 View  Result Date: 11/07/2018 CLINICAL DATA:  Central line placement, confirm positioning. EXAM: PORTABLE CHEST 1 VIEW COMPARISON:  Numerous chest radiographs, most recently November 07, 2018, CTA chest Sep 19, 2015. FINDINGS: Interval placement of a left internal jugular approach central venous catheter. Catheter tip terminates near the confluence of the brachiocephalic vein and superior vena cava, could be advanced to insure positioning within the lower SVC/cavoatrial junction.  Widespread bilateral airspace opacities are again seen most pronounced in the right mid and lower lung. No pneumothorax or effusion is present. Cardiac silhouette remains enlarged. Cardiomediastinal contours are essentially unchanged from prior studies. No acute osseous or soft tissue abnormality. IMPRESSION: 1. Interval placement of a left internal jugular approach central venous catheter. Catheter tip terminates near the confluence of the brachiocephalic vein and superior vena cava, could be advanced 2-3 cm to ensure positioning within the lower SVC/cavoatrial junction. 2. No procedural complication identified. 3. Unchanged diffuse bilateral airspace disease. Electronically Signed   By: MD Lovena Le   On: 11/07/2018 19:06   Dg Chest Port 1 View  Result Date: 11/07/2018 CLINICAL DATA:  Shortness of breath EXAM: PORTABLE CHEST 1 VIEW COMPARISON:  November 05, 2018 FINDINGS: The heart size and mediastinal contours are stable. The heart size is enlarged. Hazy opacities are identified in the right lung and to a lesser degree in the left lung, worse compared to prior exam. There is no pleural effusion. The visualized skeletal structures are unremarkable. IMPRESSION: Bilateral pulmonary infiltrates are slightly worse compared to prior exam of November 05, 2018. Electronically Signed   By: Abelardo Diesel M.D.   On: 11/07/2018 12:10   Dg Chest Port 1 View  Result Date: 11/05/2018 CLINICAL DATA:  Acute respiratory disease secondary to COVID-19. EXAM: PORTABLE CHEST 1 VIEW 5:11 a.m. COMPARISON:  11/03/2018 and 03/25/2016 FINDINGS: Faint bilateral pulmonary infiltrates appear slightly improved at the bases. Heart size and pulmonary vascularity are normal. No effusions. No bone abnormality. IMPRESSION: Slight improvement in faint bilateral pulmonary infiltrates. Electronically Signed   By: Lorriane Shire M.D.   On: 11/05/2018 05:50   Dg Chest Port 1 View  Result Date: 11/03/2018 CLINICAL DATA:  Cough.  COVID-19. EXAM: PORTABLE CHEST 1 VIEW COMPARISON:  03/25/2016. FINDINGS: Mediastinum hilar structures normal. Cardiomegaly with mild pulmonary venous congestion and bilateral interstitial prominence. CHF could present this fashion. Pneumonitis particularly in light of the patient's history of COVID-19 infection could also present this fashion. Small left pleural effusion cannot be excluded. IMPRESSION: 1. Cardiomegaly with mild pulmonary venous congestion and bilateral interstitial prominence. CHF could present in this fashion. Pneumonitis particularly in light of the patient's history of COVID-19 infection could also present this fashion. 2.  Small left pleural effusion cannot be excluded. Electronically Signed   By: Marcello Moores  Register   On: 11/03/2018 14:31   Dg Chest Port 1v Same Day  Result Date: 11/19/2018 CLINICAL DATA:  Pneumonia in a patient who is COVID-19 positive.  EXAM: PORTABLE CHEST 1 VIEW COMPARISON:  Single-view of the chest 11/18/2018 and 11/13/2018. FINDINGS: Left IJ catheter is unchanged. Extensive bilateral airspace disease persists without change given differences in technique. Heart size is normal. No pneumothorax or pleural effusion. IMPRESSION: No notable change in extensive bilateral airspace disease. Electronically Signed   By: Inge Rise M.D.   On: 11/19/2018 11:51   [place] Ekg Site Rite  Result Date: 11/20/2018 If Site Rite image not attached, placement could not be confirmed due to current cardiac rhythm.  [place] Ekg Site Rite  Result Date: 11/19/2018 If [facility] image not attached, placement could not be confirmed due to current cardiac rhythm.

## 2018-11-21 NOTE — Progress Notes (Signed)
Bilateral lower extremity duplex complete results are located under CV proc.  Andrew Larsen

## 2018-11-21 NOTE — Progress Notes (Addendum)
Physical Therapy Treatment Patient Details Name: Andrew Larsen MRN: [id number] DOB: [birth year] Today's Date: 11/21/2018    History of Present Illness Pt is a 78 y.o. male admitted 11/03/18 with SOB and cough; found to have COVID-19. Hospital course complicated by worsening hypoxemia requirng transfer to ICU. 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   PT Comments    Pt progressing well with mobility. Able to stand and ambulate short distance with RW and minA for mobility. Pt demonstrates decreased balance strategies and postural reactions, unable to tolerate challenge to balance; at high risk for falls. Also limited by decreased activity tolerance and generalized weakness. Feel pt would benefit from intensive CIR-level therapies to maximize functional mobility and independence prior to return home. Pt agreeable to post-acute rehab.   Follow Up Recommendations  CIR;Supervision for mobility/OOB     Equipment Recommendations  (TBD)    Recommendations for Other Services Rehab consult     Precautions / Restrictions Precautions Precautions: Fall Restrictions Weight Bearing Restrictions: No    Mobility  Bed Mobility Overal bed mobility: Needs Assistance Bed Mobility: Sit to Supine       Sit to supine: Supervision   General bed mobility comments: For safety with lines  Transfers Overall transfer level: Needs assistance Equipment used: Rolling walker (2 wheeled) Transfers: Sit to/from Stand Sit to Stand: Min assist         General transfer comment: Reliant on momentum to power into standing, minA to assist trunk elevation and steady balance  Ambulation/Gait Ambulation/Gait assistance: Min assist Gait Distance (Feet): 18 Feet Assistive device: Rolling walker (2 wheeled) Gait Pattern/deviations: Step-to pattern;Leaning posteriorly;Trunk flexed Gait  velocity: Decreased Gait velocity interpretation: <1.31 ft/sec, indicative of household ambulator General Gait Details: Ambulate from chair around bed with RW and minA for balance; slow, labored gait with RW. SpO2 down to 79% on 12L O2 HFNC   Stairs             Wheelchair Mobility    Modified Rankin (Stroke Patients Only)       Balance Overall balance assessment: History of Falls;Needs assistance Sitting-balance support: Bilateral upper extremity supported;Feet supported Sitting balance-Leahy Scale: Good     Standing balance support: Bilateral upper extremity supported;During functional activity Standing balance-Leahy Scale: Poor Standing balance comment: Reliant on UE support                            Cognition Arousal/Alertness: Awake/alert Behavior During Therapy: WFL for tasks assessed/performed Overall Cognitive Status: Within Functional Limits for tasks assessed                                        Exercises General Exercises - Lower Extremity Long Arc Quad: AROM;Both;Seated Hip Flexion/Marching: AROM;Both;Seated Toe Raises: AROM;Both;Seated Heel Raises: AROM;Both;Seated    General Comments General comments (skin integrity, edema, etc.): SpO2 down to 79% on 12L O2 HFNC. RR in 30-402. HR 130s. BP 122/74      Pertinent Vitals/Pain Pain Assessment: Faces Faces Pain Scale: Hurts a little bit Pain Location: Generalized Pain Descriptors / Indicators: Discomfort Pain Intervention(s): Monitored during session    Home Living                      Prior Function  PT Goals (current goals can now be found in the care plan section) Acute Rehab PT Goals Patient Stated Goal: Return home to family PT Goal Formulation: With patient Time For Goal Achievement: 12/01/18 Potential to Achieve Goals: Good Progress towards PT goals: Progressing toward goals    Frequency    Min 3X/week      PT Plan Discharge  plan needs to be updated    Co-evaluation              AM-PAC PT "6 Clicks" Mobility   Outcome Measure  Help needed turning from your back to your side while in a flat bed without using bedrails?: None Help needed moving from lying on your back to sitting on the side of a flat bed without using bedrails?: A Little Help needed moving to and from a bed to a chair (including a wheelchair)?: A Little Help needed standing up from a chair using your arms (e.g., wheelchair or bedside chair)?: A Little Help needed to walk in hospital room?: A Little Help needed climbing 3-5 steps with a railing? : A Lot 6 Click Score: 18    End of Session Equipment Utilized During Treatment: Oxygen Activity Tolerance: Patient tolerated treatment well Patient left: in bed;with bed alarm set;with nursing/sitter in room Nurse Communication: Mobility status PT Visit Diagnosis: Unsteadiness on feet (R26.81);Muscle weakness (generalized) (M62.81);Repeated falls (R29.6)     Time: 6701-4103 PT Time Calculation (min) (ACUTE ONLY): 26 min  Charges:  $Gait Training: 8-22 mins $Therapeutic Exercise: 8-22 mins                    Mabeline Caras, PT, DPT Acute Rehabilitation Services  Pager [phone] Office [place] 11/21/2018, 6:19 PM

## 2018-11-22 LAB — CBC
HCT: 46.3 % (ref 39.0–52.0)
Hemoglobin: 15.8 g/dL (ref 13.0–17.0)
MCH: 30.8 pg (ref 26.0–34.0)
MCHC: 34.1 g/dL (ref 30.0–36.0)
MCV: 90.3 fL (ref 80.0–100.0)
Platelets: 178 10*3/uL (ref 150–400)
RBC: 5.13 MIL/uL (ref 4.22–5.81)
RDW: 14 % (ref 11.5–15.5)
WBC: 13 10*3/uL — ABNORMAL HIGH (ref 4.0–10.5)
nRBC: 0 % (ref 0.0–0.2)

## 2018-11-22 LAB — COMPREHENSIVE METABOLIC PANEL
ALT: 45 U/L — ABNORMAL HIGH (ref 0–44)
AST: 21 U/L (ref 15–41)
Albumin: 3.4 g/dL — ABNORMAL LOW (ref 3.5–5.0)
Alkaline Phosphatase: 85 U/L (ref 38–126)
Anion gap: 13 (ref 5–15)
BUN: 42 mg/dL — ABNORMAL HIGH (ref 8–23)
CO2: 26 mmol/L (ref 22–32)
Calcium: 9.4 mg/dL (ref 8.9–10.3)
Chloride: 94 mmol/L — ABNORMAL LOW (ref 98–111)
Creatinine, Ser: 1.5 mg/dL — ABNORMAL HIGH (ref 0.61–1.24)
GFR calc Af Amer: 52 mL/min — ABNORMAL LOW (ref >60)
GFR calc non Af Amer: 45 mL/min — ABNORMAL LOW (ref >60)
Glucose, Bld: 120 mg/dL — ABNORMAL HIGH (ref 70–99)
Potassium: 4.3 mmol/L (ref 3.5–5.1)
Sodium: 133 mmol/L — ABNORMAL LOW (ref 135–145)
Total Bilirubin: 0.6 mg/dL (ref 0.3–1.2)
Total Protein: 6 g/dL — ABNORMAL LOW (ref 6.5–8.1)

## 2018-11-22 LAB — GLUCOSE, CAPILLARY
Glucose-Capillary: 42 mg/dL — CL (ref 70–99)
Glucose-Capillary: 63 mg/dL — ABNORMAL LOW (ref 70–99)
Glucose-Capillary: 89 mg/dL (ref 70–99)
Glucose-Capillary: 321 mg/dL — ABNORMAL HIGH (ref 70–99)
Glucose-Capillary: 350 mg/dL — ABNORMAL HIGH (ref 70–99)
Glucose-Capillary: 272 mg/dL — ABNORMAL HIGH (ref 70–99)

## 2018-11-22 LAB — D-DIMER, QUANTITATIVE: D-Dimer, Quant: 2.24 ug/mL-FEU — ABNORMAL HIGH (ref 0.00–0.50)

## 2018-11-22 LAB — C-REACTIVE PROTEIN: CRP: 0.9 mg/dL (ref <1.0)

## 2018-11-22 LAB — FERRITIN: Ferritin: 349 ng/mL — ABNORMAL HIGH (ref 24–336)

## 2018-11-22 MED ORDER — INSULIN ASPART PROT & ASPART (70-30 MIX) 100 UNIT/ML ~~LOC~~ SUSP
20.0000 [IU] | Freq: Every day | SUBCUTANEOUS | Status: DC
  Administered 2018-11-23: 20 [IU] via SUBCUTANEOUS
  Filled 2018-11-22: qty 10

## 2018-11-22 MED ORDER — INSULIN ASPART PROT & ASPART (70-30 MIX) 100 UNIT/ML ~~LOC~~ SUSP
12.0000 [IU] | Freq: Every day | SUBCUTANEOUS | Status: DC
  Administered 2018-11-22: 12 [IU] via SUBCUTANEOUS

## 2018-11-22 NOTE — Progress Notes (Signed)
Called to give update to pt's daughter. Questions answered. Daughter was concerned that pt was not getting bathed. Per chart pt had a bath yesterday but will coordinate with NT today as well as educate pt that he can ask to be set up for a bath. Pt is mainly independent after setup. Informed daughter of the same.

## 2018-11-22 NOTE — Progress Notes (Signed)
Pt tx from bed to chair with min assist. Pt desat into 70's on 12L HFNC. Pt able to recover to 90% after aprox 15 min

## 2018-11-22 NOTE — Progress Notes (Signed)
Hypoglycemic Event  CBG: 63 on recheck   Treatment: 4 oz juice/soda  Symptoms: None  Follow-up CBG: [id number] CBG Result:89  Possible Reasons for Event: Unknown  Comments/MD notified:Dr Ghmire- hold all insulin at this time    Andrew Larsen

## 2018-11-22 NOTE — Progress Notes (Signed)
PROGRESS NOTE                                                                                                                                                                                                             Patient Demographics:    Andrew Larsen, is a 78 y.o. male, DOB - [birth year], CWC:[id number]  Outpatient Primary MD for the patient is Velna Hatchet, MD    LOS - 17  Chief Complaint  Patient presents with   Covid 19   Cough       Brief Narrative: Patient is a 78 y.o. male with PMHx of DM-2, HTN, BPH, PAD, OSA-who presented with dry cough, shortness of breath-was found to have acute hypoxic respiratory failure in the setting of COVID-19 pneumonia.  Hospital course complicated by worsening hypoxemia-necessitating transfer to the intensive care unit.  Upon further stability-he was transferred back to progressive care unit.   Subjective:    Andrew Larsen remains essentially remains the same-on 12 L of high flow oxygen.  No major events overnight.   Assessment  & Plan :   Acute Hypoxic Resp Failure due to Covid 19 Viral pneumonia: Remains essentially the same-still on 12 L of oxygen via high flow-his improvement seems to have plateaued over the past few days.  Continue IV steroids-repeat IV Lasix today in an attempt to keep him in negative balance.  Patient has completed a course of Remdesivir and is s/p Actemra.  Encourage ambulation, out of bed to chair-incentive spirometry and other supportive care.  COVID-19 Labs:  Recent Labs    11/21/18 0345 11/22/18 0042  DDIMER 2.96* 2.24*  FERRITIN 396* 349*  CRP <0.8 0.9    Lab Results  Component Value Date   SARSCOV2NAA POSITIVE (A) 11/03/2018     COVID-19 Medications: 7/10 and 7/11>> Actemra 7/11>> convalescent plasma 7/10-7/14>> Remdesivir  Mild epistaxis: Occurred on 7/22-resolved.    Hyperkalemia: Occurred on 7/24-treated with IV  insulin/dextrose and 1 dose of Lokelma-resolved.    Chronic diastolic heart failure: Clinically compensated-repeat 1 dose of Lasix to keep in negative balance to see if this will help with S OB.  DM-2 (A1c 7.3 on 11/08/2018): Hypoglycemic episode earlier this morning-decrease evening insulin 70/30 dosage to 12 units, continue 20 units of insulin 70/30 with breakfast.  Follow and adjust accordingly  HTN: Currently controlled-not on any  antihypertensives-follow.  CAD: No anginal symptoms-continue aspirin  PAD: Appears stable-continue cilostazol and aspirin  BPH: Continue Flomax  OSA: Unable to use CPAP-continue high flow oxygen  Diabetic peripheral neuropathy: Continue Neurontin  Recently diagnosed inclusion body myositis: Follow with neurology at [facility]-Dr. Warnell Forester ([id number]   ABG:    Component Value Date/Time   PHART 7.459 (H) 11/07/2018 1312   PCO2ART 30.5 (L) 11/07/2018 1312   PO2ART 46.0 (L) 11/07/2018 1312   HCO3 21.7 11/07/2018 1312   TCO2 23 11/07/2018 1312   ACIDBASEDEF 1.0 11/07/2018 1312   O2SAT 85.0 11/07/2018 1312    Condition - Extremely Guarded-remains very tenuous and at risk for further deterioration-daughter aware  Family Communication  : Spoke with patient's daughter over the phone on 7/25  Code Status :  Full Code  Diet :  Diet Order            Diet regular Room service appropriate? Yes; Fluid consistency: Thin  Diet effective now               Disposition Plan  :  Remain inpatient  Consults  :  PCCM  Procedures  :   7/10>>7/22 left IJ CVL 7/24>>PICC 7/24>> lower extremity Doppler negative for DVT  DVT Prophylaxis  :  Lovenox -watch d-dimer   Lab Results  Component Value Date   PLT 178 11/22/2018    Inpatient Medications  Scheduled Meds:  sodium chloride   Intravenous Once   aspirin  81 mg Oral QHS   bisacodyl  10 mg Oral Once   brimonidine  1 drop Both Eyes BID   Chlorhexidine Gluconate Cloth  6 each Topical Daily    cilostazol  100 mg Oral BID   enoxaparin (LOVENOX) injection  60 mg Subcutaneous Q24H   famotidine  20 mg Oral QHS   folic acid  1 mg Oral Daily   gabapentin  600 mg Oral TID   insulin aspart  0-20 Units Subcutaneous TID WC   insulin aspart protamine- aspart  20 Units Subcutaneous BID WC   Ipratropium-Albuterol  2 puff Inhalation QID   latanoprost  1 drop Both Eyes QHS   mouth rinse  15 mL Mouth Rinse BID   methylPREDNISolone (SOLU-MEDROL) injection  40 mg Intravenous Daily   oxybutynin  5 mg Oral BID   polyethylene glycol  17 g Oral Once   senna-docusate  2 tablet Oral BID   sodium chloride flush  10-40 mL Intracatheter Q12H   sodium chloride flush  3 mL Intravenous Q12H   tamsulosin  0.4 mg Oral Daily   thiamine  100 mg Oral Daily   timolol  1 drop Both Eyes BID   vitamin C  500 mg Oral Daily   zinc sulfate  220 mg Oral Daily   Continuous Infusions: PRN Meds:.acetaminophen, benzonatate, chlorpheniramine-HYDROcodone, guaiFENesin-dextromethorphan, ondansetron **OR** ondansetron (ZOFRAN) IV, senna-docusate, sodium chloride, sodium chloride flush  Antibiotics  :    Anti-infectives (From admission, onward)   Start     Dose/Rate Route Frequency Ordered Stop   11/08/18 1330  remdesivir 100 mg in sodium chloride 0.9 % 250 mL IVPB     100 mg 500 mL/hr over 30 Minutes Intravenous Every 24 hours 11/07/18 1229 11/11/18 1304   11/07/18 1330  remdesivir 200 mg in sodium chloride 0.9 % 250 mL IVPB     200 mg 500 mL/hr over 30 Minutes Intravenous Once 11/07/18 1229 11/08/18 1529       Time Spent in minutes  25  Oren Binet M.D on 11/22/2018 at 11:20 AM  To page go to [email or web address] - use universal password  [facility] -  Office  [phone]   See all Orders from today for further details   Admit date - 11/03/2018    17    Objective:   Vitals:   11/21/18 2040 11/22/18 0020 11/22/18 0400 11/22/18 0810  BP: 117/73 136/82 102/71 119/70  Pulse:  (!) 112 96 97 94  Resp: (!) 31 13 19 19   Temp: (!) 97.5 F (36.4 C) 97.9 F (36.6 C) (!) 97.4 F (36.3 C) (!) 97.5 F (36.4 C)  TempSrc: Oral Oral Oral Axillary  SpO2: 99% 97% 98% 95%  Weight:      Height:        Wt Readings from Last 3 Encounters:  11/21/18 113.9 kg  05/06/18 117.9 kg  04/28/18 122.8 kg     Intake/Output Summary (Last 24 hours) at 11/22/2018 1120 Last data filed at 11/22/2018 0400 Gross per 24 hour  Intake 760 ml  Output 500 ml  Net 260 ml    Physical Exam Gen Exam:Alert awake-not in any distress HEENT:atraumatic, normocephalic Chest: B/L clear to auscultation anteriorly CVS:S1S2 regular Abdomen:soft non tender, non distended Extremities:no edema Neurology: Non focal Skin: no rash   Data Review:    CBC Recent Labs  Lab 11/16/18 0500 11/17/18 0500 11/19/18 0609 11/21/18 0345 11/22/18 0042  WBC 18.6* 19.6* 14.1* 8.9 13.0*  HGB 16.1 16.1 16.1 16.2 15.8  HCT 48.3 47.4 48.6 49.9 46.3  PLT 283 260 208 160 178  MCV 89.6 89.4 90.0 92.1 90.3  MCH 29.9 30.4 29.8 29.9 30.8  MCHC 33.3 34.0 33.1 32.5 34.1  RDW 13.5 13.6 13.8 14.1 14.0  LYMPHSABS 2.0 1.9  --   --   --   MONOABS 1.6* 1.8*  --   --   --   EOSABS 0.1 0.1  --   --   --   BASOSABS 0.1 0.0  --   --   --     Chemistries  Recent Labs  Lab 11/16/18 0500 11/17/18 0500 11/19/18 0609 11/21/18 0345 11/21/18 1605 11/22/18 0042  NA 133* 132* 134* 131* 131* 133*  K 4.8 4.9 4.5 6.0* 4.9 4.3  CL 99 98 96* 97* 92* 94*  CO2 25 25 28 25 24 26   GLUCOSE 60* 94 108* 175* 348* 120*  BUN 41* 40* 41* 36* 39* 42*  CREATININE 1.49* 1.62* 1.55* 1.45* 1.61* 1.50*  CALCIUM 8.5* 8.9 9.1 8.7* 9.1 9.4  AST 34 30 31 26   --  21  ALT 64* 56* 61* 53*  --  45*  ALKPHOS 108 111 95 90  --  85  BILITOT 0.5 0.9 0.8 0.7  --  0.6   ------------------------------------------------------------------------------------------------------------------ No results for input(s): CHOL, HDL, LDLCALC, TRIG, CHOLHDL,  LDLDIRECT in the last 72 hours.  Lab Results  Component Value Date   HGBA1C 7.3 (H) 11/08/2018   ------------------------------------------------------------------------------------------------------------------ No results for input(s): TSH, T4TOTAL, T3FREE, THYROIDAB in the last 72 hours.  Invalid input(s): FREET3 ------------------------------------------------------------------------------------------------------------------ Recent Labs    11/21/18 0345 11/22/18 0042  FERRITIN 396* 349*    Coagulation profile No results for input(s): INR, PROTIME in the last 168 hours.  Recent Labs    11/21/18 0345 11/22/18 0042  DDIMER 2.96* 2.24*    Cardiac Enzymes No results for input(s): CKMB, TROPONINI, MYOGLOBIN in the last 168 hours.  Invalid input(s): CK ------------------------------------------------------------------------------------------------------------------    Component Value Date/Time   BNP  29.4 11/21/2018 0345    Micro Results No results found for this or any previous visit (from the past 240 hour(s)).  Radiology Reports Dg Chest Port 1 View  Result Date: 11/18/2018 CLINICAL DATA:  Hypoxia. EXAM: PORTABLE CHEST 1 VIEW COMPARISON:  11/13/2018. FINDINGS: Left IJ line noted stable position. Cardiomegaly. Diffuse bilateral severe pulmonary infiltrates/edema. No pleural effusion or pneumothorax. IMPRESSION: 1.  Left IJ line noted stable position. 2. Cardiomegaly. Diffuse bilateral severe pulmonary infiltrates/edema again noted. No interim change. Electronically Signed   By: [place]   On: 11/18/2018 16:16   Dg Chest Port 1 View  Result Date: 11/13/2018 CLINICAL DATA:  Acute respiratory failure with hypoxia. EXAM: PORTABLE CHEST 1 VIEW COMPARISON:  11/11/2018 FINDINGS: Left IJ central venous catheter unchanged with tip obliquely oriented over the junction of the brachiocephalic vein to SVC. Patient is rotated to the left. Lungs are hypoinflated demonstrate  interval worsening of hazy central airspace opacification right worse than left. No definite effusion. Mild stable cardiomegaly. Remainder of the exam is unchanged. IMPRESSION: Interval worsening bilateral hazy central airspace opacification right worse than left likely asymmetric interstitial edema and less likely infection. Stable cardiomegaly.  Left IJ central venous catheter unchanged. Electronically Signed   By: Marin Olp M.D.   On: 11/13/2018 08:59   Dg Chest Port 1 View  Result Date: 11/11/2018 CLINICAL DATA:  Acute respiratory failure due to hypoxemia. EXAM: PORTABLE CHEST 1 VIEW COMPARISON:  Radiograph November 10, 2018. FINDINGS: Stable cardiomediastinal silhouette. Left internal jugular catheter is unchanged in position. No pneumothorax or significant pleural effusion is noted. Stable bilateral lung opacities are noted concerning for edema or pneumonia. Bony thorax is unremarkable. IMPRESSION: Stable bilateral lung opacities as described above. Electronically Signed   By: Marijo Conception M.D.   On: 11/11/2018 07:34   Dg Chest Port 1 View  Result Date: 11/10/2018 CLINICAL DATA:  Pneumatocele of lung EXAM: PORTABLE CHEST 1 VIEW COMPARISON:  Radiograph 11/10/2018 FINDINGS: Central venous line with tip in the distal brachiocephalic vein unchanged. Stable cardiac silhouette. Fine multifocal airspace disease is slightly increased in density compared to prior as noted over the RIGHT hemidiaphragm and LEFT upper lobe. Low lung volumes. IMPRESSION: Mild increase in multifocal fine airspace disease. Electronically Signed   By: Suzy Bouchard M.D.   On: 11/10/2018 10:32   Dg Chest Port 1 View  Result Date: 11/10/2018 CLINICAL DATA:  78 year old male with COVID-19 pneumonia EXAM: PORTABLE CHEST 1 VIEW COMPARISON:  Prior chest x-ray 11/07/2018 FINDINGS: Stable position of left IJ central venous catheter. The catheter tip overlies the innominate vein. Improving appearance of multifocal patchy interstitial  and airspace opacities bilaterally. There is a region of focal clearing in the left mid lung just superior to the cardiac margin which could represent a developing pneumatocele. No acute osseous abnormality. IMPRESSION: 1. Improving appearance of bilateral multifocal interstitial and airspace opacities compared to 11/07/2018. 2. Region of more focal and slightly rounded lucency in the left mid lung could represent a developing pneumatocele versus a region of more advanced clearing. 3. Stable position of left IJ central venous catheter. Electronically Signed   By: Jacqulynn Cadet M.D.   On: 11/10/2018 08:01   Dg Chest Port 1 View  Result Date: 11/07/2018 CLINICAL DATA:  Central line placement, confirm positioning. EXAM: PORTABLE CHEST 1 VIEW COMPARISON:  Numerous chest radiographs, most recently November 07, 2018, CTA chest Sep 19, 2015. FINDINGS: Interval placement of a left internal jugular approach central venous catheter. Catheter tip  terminates near the confluence of the brachiocephalic vein and superior vena cava, could be advanced to insure positioning within the lower SVC/cavoatrial junction. Widespread bilateral airspace opacities are again seen most pronounced in the right mid and lower lung. No pneumothorax or effusion is present. Cardiac silhouette remains enlarged. Cardiomediastinal contours are essentially unchanged from prior studies. No acute osseous or soft tissue abnormality. IMPRESSION: 1. Interval placement of a left internal jugular approach central venous catheter. Catheter tip terminates near the confluence of the brachiocephalic vein and superior vena cava, could be advanced 2-3 cm to ensure positioning within the lower SVC/cavoatrial junction. 2. No procedural complication identified. 3. Unchanged diffuse bilateral airspace disease. Electronically Signed   By: MD Lovena Le   On: 11/07/2018 19:06   Dg Chest Port 1 View  Result Date: 11/07/2018 CLINICAL DATA:  Shortness of breath EXAM:  PORTABLE CHEST 1 VIEW COMPARISON:  November 05, 2018 FINDINGS: The heart size and mediastinal contours are stable. The heart size is enlarged. Hazy opacities are identified in the right lung and to a lesser degree in the left lung, worse compared to prior exam. There is no pleural effusion. The visualized skeletal structures are unremarkable. IMPRESSION: Bilateral pulmonary infiltrates are slightly worse compared to prior exam of November 05, 2018. Electronically Signed   By: Abelardo Diesel M.D.   On: 11/07/2018 12:10   Dg Chest Port 1 View  Result Date: 11/05/2018 CLINICAL DATA:  Acute respiratory disease secondary to COVID-19. EXAM: PORTABLE CHEST 1 VIEW 5:11 a.m. COMPARISON:  11/03/2018 and 03/25/2016 FINDINGS: Faint bilateral pulmonary infiltrates appear slightly improved at the bases. Heart size and pulmonary vascularity are normal. No effusions. No bone abnormality. IMPRESSION: Slight improvement in faint bilateral pulmonary infiltrates. Electronically Signed   By: Lorriane Shire M.D.   On: 11/05/2018 05:50   Dg Chest Port 1 View  Result Date: 11/03/2018 CLINICAL DATA:  Cough.  COVID-19. EXAM: PORTABLE CHEST 1 VIEW COMPARISON:  03/25/2016. FINDINGS: Mediastinum hilar structures normal. Cardiomegaly with mild pulmonary venous congestion and bilateral interstitial prominence. CHF could present this fashion. Pneumonitis particularly in light of the patient's history of COVID-19 infection could also present this fashion. Small left pleural effusion cannot be excluded. IMPRESSION: 1. Cardiomegaly with mild pulmonary venous congestion and bilateral interstitial prominence. CHF could present in this fashion. Pneumonitis particularly in light of the patient's history of COVID-19 infection could also present this fashion. 2.  Small left pleural effusion cannot be excluded. Electronically Signed   By: Marcello Moores  Register   On: 11/03/2018 14:31   Dg Chest Port 1v Same Day  Result Date: 11/19/2018 CLINICAL DATA:  Pneumonia in  a patient who is COVID-19 positive. EXAM: PORTABLE CHEST 1 VIEW COMPARISON:  Single-view of the chest 11/18/2018 and 11/13/2018. FINDINGS: Left IJ catheter is unchanged. Extensive bilateral airspace disease persists without change given differences in technique. Heart size is normal. No pneumothorax or pleural effusion. IMPRESSION: No notable change in extensive bilateral airspace disease. Electronically Signed   By: Inge Rise M.D.   On: 11/19/2018 11:51   Vas [place] Lower Extremity Venous (dvt)  Result Date: 11/21/2018  Lower Venous Study Indications: Swelling, and Edema. Other Indications: Elevated D Dimer. Risk Factors: COVID 19 and obesity. Performing Technologist: Darlina Sicilian RDCS  Examination Guidelines: A complete evaluation includes B-mode imaging, spectral Doppler, color Doppler, and power Doppler as needed of all accessible portions of each vessel. Bilateral testing is considered an integral part of a complete examination. Limited examinations for reoccurring indications may be  performed as noted.  +---------+---------------+---------+-----------+----------+-------------------+  RIGHT     Compressibility Phasicity Spontaneity Properties Summary              +---------+---------------+---------+-----------+----------+-------------------+  CFV       Full            Yes       Yes                                         +---------+---------------+---------+-----------+----------+-------------------+  SFJ       Full                                                                  +---------+---------------+---------+-----------+----------+-------------------+  FV Prox   Full                                                                  +---------+---------------+---------+-----------+----------+-------------------+  FV Mid    Full                                                                  +---------+---------------+---------+-----------+----------+-------------------+  FV Distal Full                                                                   +---------+---------------+---------+-----------+----------+-------------------+  POP       Full            Yes       Yes                                         +---------+---------------+---------+-----------+----------+-------------------+  PTV       Full                                                                  +---------+---------------+---------+-----------+----------+-------------------+  PERO                                                       Not visualized well  +---------+---------------+---------+-----------+----------+-------------------+   +---------+---------------+---------+-----------+----------+-------+  LEFT  Compressibility Phasicity Spontaneity Properties Summary  +---------+---------------+---------+-----------+----------+-------+  CFV       Full            Yes       Yes                             +---------+---------------+---------+-----------+----------+-------+  SFJ       Full                                                      +---------+---------------+---------+-----------+----------+-------+  FV Prox   Full                                                      +---------+---------------+---------+-----------+----------+-------+  FV Mid    Full                                                      +---------+---------------+---------+-----------+----------+-------+  FV Distal Full                                                      +---------+---------------+---------+-----------+----------+-------+  POP       Full            Yes       Yes                             +---------+---------------+---------+-----------+----------+-------+  PTV       Full                                                      +---------+---------------+---------+-----------+----------+-------+  PERO      Full                                                      +---------+---------------+---------+-----------+----------+-------+     Summary:  Right: There is no evidence of deep vein thrombosis in the lower extremity. No cystic structure found in the popliteal fossa. Left: There is no evidence of deep vein thrombosis in the lower extremity. No cystic structure found in the popliteal fossa.  *See table(s) above for measurements and observations. Electronically signed by Deitra Mayo MD on 11/21/2018 at 2:49:58 PM.    Final    [place] Ekg Site Rite  Result Date: 11/20/2018 If Site Rite image not attached, placement could not be confirmed due to current cardiac rhythm.  [place] Ekg Site Rite  Result Date: 11/19/2018 If [facility] image not  attached, placement could not be confirmed due to current cardiac rhythm.

## 2018-11-22 NOTE — Progress Notes (Signed)
Hypoglycemic Event  CBG: 42  Treatment: 8 oz juice/soda  Symptoms: None  Follow-up CBG: Time: 0830 CBG Result:63  Possible Reasons for Event: Unknown  Comments/MD notified:Dr Ghmire via epic chat    Eda Keys

## 2018-11-23 LAB — CBC
HCT: 43.5 % (ref 39.0–52.0)
Hemoglobin: 14.6 g/dL (ref 13.0–17.0)
MCH: 30.2 pg (ref 26.0–34.0)
MCHC: 33.6 g/dL (ref 30.0–36.0)
MCV: 89.9 fL (ref 80.0–100.0)
Platelets: 145 10*3/uL — ABNORMAL LOW (ref 150–400)
RBC: 4.84 MIL/uL (ref 4.22–5.81)
RDW: 13.8 % (ref 11.5–15.5)
WBC: 8.1 10*3/uL (ref 4.0–10.5)
nRBC: 0 % (ref 0.0–0.2)

## 2018-11-23 LAB — COMPREHENSIVE METABOLIC PANEL
ALT: 38 U/L (ref 0–44)
AST: 17 U/L (ref 15–41)
Albumin: 3.1 g/dL — ABNORMAL LOW (ref 3.5–5.0)
Alkaline Phosphatase: 67 U/L (ref 38–126)
Anion gap: 10 (ref 5–15)
BUN: 36 mg/dL — ABNORMAL HIGH (ref 8–23)
CO2: 27 mmol/L (ref 22–32)
Calcium: 9 mg/dL (ref 8.9–10.3)
Chloride: 95 mmol/L — ABNORMAL LOW (ref 98–111)
Creatinine, Ser: 1.24 mg/dL (ref 0.61–1.24)
GFR calc Af Amer: >60 mL/min (ref >60)
GFR calc non Af Amer: 56 mL/min — ABNORMAL LOW (ref >60)
Glucose, Bld: 231 mg/dL — ABNORMAL HIGH (ref 70–99)
Potassium: 4.7 mmol/L (ref 3.5–5.1)
Sodium: 132 mmol/L — ABNORMAL LOW (ref 135–145)
Total Bilirubin: 0.9 mg/dL (ref 0.3–1.2)
Total Protein: 5.5 g/dL — ABNORMAL LOW (ref 6.5–8.1)

## 2018-11-23 LAB — GLUCOSE, CAPILLARY
Glucose-Capillary: 260 mg/dL — ABNORMAL HIGH (ref 70–99)
Glucose-Capillary: 308 mg/dL — ABNORMAL HIGH (ref 70–99)
Glucose-Capillary: 235 mg/dL — ABNORMAL HIGH (ref 70–99)

## 2018-11-23 LAB — FERRITIN: Ferritin: 312 ng/mL (ref 24–336)

## 2018-11-23 LAB — D-DIMER, QUANTITATIVE: D-Dimer, Quant: 1.77 ug/mL-FEU — ABNORMAL HIGH (ref 0.00–0.50)

## 2018-11-23 LAB — C-REACTIVE PROTEIN: CRP: <0.8 mg/dL (ref <1.0)

## 2018-11-23 MED ORDER — INSULIN ASPART PROT & ASPART (70-30 MIX) 100 UNIT/ML ~~LOC~~ SUSP
16.0000 [IU] | Freq: Every day | SUBCUTANEOUS | Status: DC
  Administered 2018-11-23: 16 [IU] via SUBCUTANEOUS

## 2018-11-23 MED ORDER — FUROSEMIDE 10 MG/ML IJ SOLN
40.0000 mg | Freq: Once | INTRAMUSCULAR | Status: AC
  Administered 2018-11-23: 40 mg via INTRAVENOUS
  Filled 2018-11-23: qty 4

## 2018-11-23 NOTE — Progress Notes (Signed)
PROGRESS NOTE                                                                                                                                                                                                             Patient Demographics:    Andrew Larsen, is a 78 y.o. male, DOB - 1940/10/12, BHA:[id number]  Outpatient Primary MD for the patient is Velna Hatchet, MD    LOS - 77  Chief Complaint  Patient presents with   Covid 19   Cough       Brief Narrative: Patient is a 78 y.o. male with PMHx of DM-2, HTN, BPH, PAD, OSA-who presented with dry cough, shortness of breath-was found to have acute hypoxic respiratory failure in the setting of COVID-19 pneumonia.  Hospital course complicated by worsening hypoxemia-necessitating transfer to the intensive care unit.  Upon further stability-he was transferred back to progressive care unit.   Subjective:    Andrew Larsen feels somewhat better-RN able to finally get him down to 8 L this morning (was on 12-15 L of high flow oxygen for the past 4-5 days)   Assessment  & Plan :   Acute Hypoxic Resp Failure due to Covid 19 Viral pneumonia: Some improvement this morning-was able to be titrated down to 8 L high flow oxygen.  Continue IV steroids-repeat 1 additional dose of Lasix today.  Patient has completed a course of Remdesivir, and received Actemra x1.  Continue to encourage incentive spirometry, ambulation.  Continue at attempts to slowly titrate down his FiO2.  I  COVID-19 Labs:  Recent Labs    11/21/18 0345 11/22/18 0042 11/23/18 0500  DDIMER 2.96* 2.24* 1.77*  FERRITIN 396* 349* 312  CRP <0.8 0.9 <0.8    Lab Results  Component Value Date   SARSCOV2NAA POSITIVE (A) 11/03/2018     COVID-19 Medications: 7/10 and 7/11>> Actemra 7/11>> convalescent plasma 7/10-7/14>> Remdesivir  Mild epistaxis: Occurred on 7/22-resolved.    Hyperkalemia: Occurred on  7/24-treated with IV insulin/dextrose and 1 dose of Lokelma-resolved.    Chronic diastolic heart failure: Clinically compensated-repeat 1 more dose of Lasix today-weight has gone up slightly but he has no evidence of volume overload-we will continue to give Lasix to see if this will help down titrate FiO2.  Continue attempts to keep in negative balance as much as possible.  DM-2 (A1c 7.3 on 11/08/2018): No hypoglycemic-episode this morning-had hypoglycemia on 7/25.  Increase insulin 70/30 to 16 units with dinner, continue 20 units of insulin 70/30 with breakfast.    HTN: Currently controlled-not on any antihypertensives-follow.  CAD: No anginal symptoms-continue aspirin  PAD: Appears stable-continue cilostazol and aspirin  BPH: Continue Flomax  OSA: Unable to use CPAP-continue high flow oxygen  Diabetic peripheral neuropathy: Continue Neurontin  Recently diagnosed inclusion body myositis: Follow with neurology at [facility]-Dr. Warnell Forester ([phone]  Deconditioning/debility: Secondary to acute illness-also has inclusion body myositis as baseline.  PT/OT recommending CIR on discharge (ordered COVID-19 PCR today).    ABG:    Component Value Date/Time   PHART 7.459 (H) 11/07/2018 1312   PCO2ART 30.5 (L) 11/07/2018 1312   PO2ART 46.0 (L) 11/07/2018 1312   HCO3 21.7 11/07/2018 1312   TCO2 23 11/07/2018 1312   ACIDBASEDEF 1.0 11/07/2018 1312   O2SAT 85.0 11/07/2018 1312    Condition - Extremely Guarded-remains very tenuous and at risk for further deterioration-daughter aware  Family Communication  : Spoke with patient's daughter over the phone on 7/26  Code Status :  Full Code  Diet :  Diet Order            Diet regular Room service appropriate? Yes; Fluid consistency: Thin  Diet effective now               Disposition Plan  :  Remain inpatient  Consults  :  PCCM  Procedures  :   7/10>>7/22 left IJ CVL 7/24>>PICC 7/24>> lower extremity Doppler negative for DVT  DVT  Prophylaxis  :  Lovenox -watch d-dimer   Lab Results  Component Value Date   PLT 145 (L) 11/23/2018    Inpatient Medications  Scheduled Meds:  sodium chloride   Intravenous Once   aspirin  81 mg Oral QHS   bisacodyl  10 mg Oral Once   brimonidine  1 drop Both Eyes BID   Chlorhexidine Gluconate Cloth  6 each Topical Daily   cilostazol  100 mg Oral BID   enoxaparin (LOVENOX) injection  60 mg Subcutaneous Q24H   famotidine  20 mg Oral QHS   folic acid  1 mg Oral Daily   gabapentin  600 mg Oral TID   insulin aspart  0-20 Units Subcutaneous TID WC   insulin aspart protamine- aspart  12 Units Subcutaneous Q supper   insulin aspart protamine- aspart  20 Units Subcutaneous Q breakfast   Ipratropium-Albuterol  2 puff Inhalation QID   latanoprost  1 drop Both Eyes QHS   mouth rinse  15 mL Mouth Rinse BID   methylPREDNISolone (SOLU-MEDROL) injection  40 mg Intravenous Daily   oxybutynin  5 mg Oral BID   polyethylene glycol  17 g Oral Once   senna-docusate  2 tablet Oral BID   sodium chloride flush  10-40 mL Intracatheter Q12H   sodium chloride flush  3 mL Intravenous Q12H   tamsulosin  0.4 mg Oral Daily   thiamine  100 mg Oral Daily   timolol  1 drop Both Eyes BID   vitamin C  500 mg Oral Daily   zinc sulfate  220 mg Oral Daily   Continuous Infusions: PRN Meds:.acetaminophen, benzonatate, chlorpheniramine-HYDROcodone, guaiFENesin-dextromethorphan, ondansetron **OR** ondansetron (ZOFRAN) IV, senna-docusate, sodium chloride, sodium chloride flush  Antibiotics  :    Anti-infectives (From admission, onward)   Start     Dose/Rate Route Frequency Ordered Stop   11/08/18 1330  remdesivir 100 mg in  sodium chloride 0.9 % 250 mL IVPB     100 mg 500 mL/hr over 30 Minutes Intravenous Every 24 hours 11/07/18 1229 11/11/18 1304   11/07/18 1330  remdesivir 200 mg in sodium chloride 0.9 % 250 mL IVPB     200 mg 500 mL/hr over 30 Minutes Intravenous Once 11/07/18 1229  11/08/18 1529       Time Spent in minutes  25   Oren Binet M.D on 11/23/2018 at 10:31 AM  To page go to [email or web address] - use universal password  [facility] -  Office  [phone]   See all Orders from today for further details   Admit date - 11/03/2018    18    Objective:   Vitals:   11/23/18 0500 11/23/18 0600 11/23/18 0800 11/23/18 0850  BP:   129/87 130/73  Pulse:   96 96  Resp:   18 (!) 22  Temp:   (!) 97.4 F (36.3 C)   TempSrc:   Oral   SpO2:  92% 90%   Weight: 116.2 kg     Height:        Wt Readings from Last 3 Encounters:  11/23/18 116.2 kg  05/06/18 117.9 kg  04/28/18 122.8 kg     Intake/Output Summary (Last 24 hours) at 11/23/2018 1031 Last data filed at 11/23/2018 0600 Gross per 24 hour  Intake --  Output 1200 ml  Net -1200 ml    Physical Exam Gen Exam:Alert awake-not in any distress HEENT:atraumatic, normocephalic Chest: B/L clear to auscultation anteriorly CVS:S1S2 regular Abdomen:soft non tender, non distended Extremities:no edema Neurology: Non focal Skin: no rash   Data Review:    CBC Recent Labs  Lab 11/17/18 0500 11/19/18 0609 11/21/18 0345 11/22/18 0042 11/23/18 0500  WBC 19.6* 14.1* 8.9 13.0* 8.1  HGB 16.1 16.1 16.2 15.8 14.6  HCT 47.4 48.6 49.9 46.3 43.5  PLT 260 208 160 178 145*  MCV 89.4 90.0 92.1 90.3 89.9  MCH 30.4 29.8 29.9 30.8 30.2  MCHC 34.0 33.1 32.5 34.1 33.6  RDW 13.6 13.8 14.1 14.0 13.8  LYMPHSABS 1.9  --   --   --   --   MONOABS 1.8*  --   --   --   --   EOSABS 0.1  --   --   --   --   BASOSABS 0.0  --   --   --   --     Chemistries  Recent Labs  Lab 11/17/18 0500 11/19/18 0609 11/21/18 0345 11/21/18 1605 11/22/18 0042 11/23/18 0500  NA 132* 134* 131* 131* 133* 132*  K 4.9 4.5 6.0* 4.9 4.3 4.7  CL 98 96* 97* 92* 94* 95*  CO2 25 28 25 24 26 27   GLUCOSE 94 108* 175* 348* 120* 231*  BUN 40* 41* 36* 39* 42* 36*  CREATININE 1.62* 1.55* 1.45* 1.61* 1.50* 1.24  CALCIUM 8.9 9.1 8.7*  9.1 9.4 9.0  AST 30 31 26   --  21 17  ALT 56* 61* 53*  --  45* 38  ALKPHOS 111 95 90  --  85 67  BILITOT 0.9 0.8 0.7  --  0.6 0.9   ------------------------------------------------------------------------------------------------------------------ No results for input(s): CHOL, HDL, LDLCALC, TRIG, CHOLHDL, LDLDIRECT in the last 72 hours.  Lab Results  Component Value Date   HGBA1C 7.3 (H) 11/08/2018   ------------------------------------------------------------------------------------------------------------------ No results for input(s): TSH, T4TOTAL, T3FREE, THYROIDAB in the last 72 hours.  Invalid input(s): FREET3 ------------------------------------------------------------------------------------------------------------------ Recent Labs  11/22/18 0042 11/23/18 0500  FERRITIN 349* 312    Coagulation profile No results for input(s): INR, PROTIME in the last 168 hours.  Recent Labs    11/22/18 0042 11/23/18 0500  DDIMER 2.24* 1.77*    Cardiac Enzymes No results for input(s): CKMB, TROPONINI, MYOGLOBIN in the last 168 hours.  Invalid input(s): CK ------------------------------------------------------------------------------------------------------------------    Component Value Date/Time   BNP 29.4 11/21/2018 0345    Micro Results No results found for this or any previous visit (from the past 240 hour(s)).  Radiology Reports Dg Chest Port 1 View  Result Date: 11/18/2018 CLINICAL DATA:  Hypoxia. EXAM: PORTABLE CHEST 1 VIEW COMPARISON:  11/13/2018. FINDINGS: Left IJ line noted stable position. Cardiomegaly. Diffuse bilateral severe pulmonary infiltrates/edema. No pleural effusion or pneumothorax. IMPRESSION: 1.  Left IJ line noted stable position. 2. Cardiomegaly. Diffuse bilateral severe pulmonary infiltrates/edema again noted. No interim change. Electronically Signed   By: [place]   On: 11/18/2018 16:16   Dg Chest Port 1 View  Result Date:  11/13/2018 CLINICAL DATA:  Acute respiratory failure with hypoxia. EXAM: PORTABLE CHEST 1 VIEW COMPARISON:  11/11/2018 FINDINGS: Left IJ central venous catheter unchanged with tip obliquely oriented over the junction of the brachiocephalic vein to SVC. Patient is rotated to the left. Lungs are hypoinflated demonstrate interval worsening of hazy central airspace opacification right worse than left. No definite effusion. Mild stable cardiomegaly. Remainder of the exam is unchanged. IMPRESSION: Interval worsening bilateral hazy central airspace opacification right worse than left likely asymmetric interstitial edema and less likely infection. Stable cardiomegaly.  Left IJ central venous catheter unchanged. Electronically Signed   By: Marin Olp M.D.   On: 11/13/2018 08:59   Dg Chest Port 1 View  Result Date: 11/11/2018 CLINICAL DATA:  Acute respiratory failure due to hypoxemia. EXAM: PORTABLE CHEST 1 VIEW COMPARISON:  Radiograph November 10, 2018. FINDINGS: Stable cardiomediastinal silhouette. Left internal jugular catheter is unchanged in position. No pneumothorax or significant pleural effusion is noted. Stable bilateral lung opacities are noted concerning for edema or pneumonia. Bony thorax is unremarkable. IMPRESSION: Stable bilateral lung opacities as described above. Electronically Signed   By: Marijo Conception M.D.   On: 11/11/2018 07:34   Dg Chest Port 1 View  Result Date: 11/10/2018 CLINICAL DATA:  Pneumatocele of lung EXAM: PORTABLE CHEST 1 VIEW COMPARISON:  Radiograph 11/10/2018 FINDINGS: Central venous line with tip in the distal brachiocephalic vein unchanged. Stable cardiac silhouette. Fine multifocal airspace disease is slightly increased in density compared to prior as noted over the RIGHT hemidiaphragm and LEFT upper lobe. Low lung volumes. IMPRESSION: Mild increase in multifocal fine airspace disease. Electronically Signed   By: Suzy Bouchard M.D.   On: 11/10/2018 10:32   Dg Chest Port 1  View  Result Date: 11/10/2018 CLINICAL DATA:  78 year old male with COVID-19 pneumonia EXAM: PORTABLE CHEST 1 VIEW COMPARISON:  Prior chest x-ray 11/07/2018 FINDINGS: Stable position of left IJ central venous catheter. The catheter tip overlies the innominate vein. Improving appearance of multifocal patchy interstitial and airspace opacities bilaterally. There is a region of focal clearing in the left mid lung just superior to the cardiac margin which could represent a developing pneumatocele. No acute osseous abnormality. IMPRESSION: 1. Improving appearance of bilateral multifocal interstitial and airspace opacities compared to 11/07/2018. 2. Region of more focal and slightly rounded lucency in the left mid lung could represent a developing pneumatocele versus a region of more advanced clearing. 3. Stable position of left IJ central  venous catheter. Electronically Signed   By: Jacqulynn Cadet M.D.   On: 11/10/2018 08:01   Dg Chest Port 1 View  Result Date: 11/07/2018 CLINICAL DATA:  Central line placement, confirm positioning. EXAM: PORTABLE CHEST 1 VIEW COMPARISON:  Numerous chest radiographs, most recently November 07, 2018, CTA chest Sep 19, 2015. FINDINGS: Interval placement of a left internal jugular approach central venous catheter. Catheter tip terminates near the confluence of the brachiocephalic vein and superior vena cava, could be advanced to insure positioning within the lower SVC/cavoatrial junction. Widespread bilateral airspace opacities are again seen most pronounced in the right mid and lower lung. No pneumothorax or effusion is present. Cardiac silhouette remains enlarged. Cardiomediastinal contours are essentially unchanged from prior studies. No acute osseous or soft tissue abnormality. IMPRESSION: 1. Interval placement of a left internal jugular approach central venous catheter. Catheter tip terminates near the confluence of the brachiocephalic vein and superior vena cava, could be advanced  2-3 cm to ensure positioning within the lower SVC/cavoatrial junction. 2. No procedural complication identified. 3. Unchanged diffuse bilateral airspace disease. Electronically Signed   By: MD Lovena Le   On: 11/07/2018 19:06   Dg Chest Port 1 View  Result Date: 11/07/2018 CLINICAL DATA:  Shortness of breath EXAM: PORTABLE CHEST 1 VIEW COMPARISON:  November 05, 2018 FINDINGS: The heart size and mediastinal contours are stable. The heart size is enlarged. Hazy opacities are identified in the right lung and to a lesser degree in the left lung, worse compared to prior exam. There is no pleural effusion. The visualized skeletal structures are unremarkable. IMPRESSION: Bilateral pulmonary infiltrates are slightly worse compared to prior exam of November 05, 2018. Electronically Signed   By: Abelardo Diesel M.D.   On: 11/07/2018 12:10   Dg Chest Port 1 View  Result Date: 11/05/2018 CLINICAL DATA:  Acute respiratory disease secondary to COVID-19. EXAM: PORTABLE CHEST 1 VIEW 5:11 a.m. COMPARISON:  11/03/2018 and 03/25/2016 FINDINGS: Faint bilateral pulmonary infiltrates appear slightly improved at the bases. Heart size and pulmonary vascularity are normal. No effusions. No bone abnormality. IMPRESSION: Slight improvement in faint bilateral pulmonary infiltrates. Electronically Signed   By: Lorriane Shire M.D.   On: 11/05/2018 05:50   Dg Chest Port 1 View  Result Date: 11/03/2018 CLINICAL DATA:  Cough.  COVID-19. EXAM: PORTABLE CHEST 1 VIEW COMPARISON:  03/25/2016. FINDINGS: Mediastinum hilar structures normal. Cardiomegaly with mild pulmonary venous congestion and bilateral interstitial prominence. CHF could present this fashion. Pneumonitis particularly in light of the patient's history of COVID-19 infection could also present this fashion. Small left pleural effusion cannot be excluded. IMPRESSION: 1. Cardiomegaly with mild pulmonary venous congestion and bilateral interstitial prominence. CHF could present in this  fashion. Pneumonitis particularly in light of the patient's history of COVID-19 infection could also present this fashion. 2.  Small left pleural effusion cannot be excluded. Electronically Signed   By: Marcello Moores  Register   On: 11/03/2018 14:31   Dg Chest Port 1v Same Day  Result Date: 11/19/2018 CLINICAL DATA:  Pneumonia in a patient who is COVID-19 positive. EXAM: PORTABLE CHEST 1 VIEW COMPARISON:  Single-view of the chest 11/18/2018 and 11/13/2018. FINDINGS: Left IJ catheter is unchanged. Extensive bilateral airspace disease persists without change given differences in technique. Heart size is normal. No pneumothorax or pleural effusion. IMPRESSION: No notable change in extensive bilateral airspace disease. Electronically Signed   By: Inge Rise M.D.   On: 11/19/2018 11:51   Vas [place] Lower Extremity Venous (dvt)  Result  Date: 11/21/2018  Lower Venous Study Indications: Swelling, and Edema. Other Indications: Elevated D Dimer. Risk Factors: COVID 19 and obesity. Performing Technologist: Darlina Sicilian RDCS  Examination Guidelines: A complete evaluation includes B-mode imaging, spectral Doppler, color Doppler, and power Doppler as needed of all accessible portions of each vessel. Bilateral testing is considered an integral part of a complete examination. Limited examinations for reoccurring indications may be performed as noted.  +---------+---------------+---------+-----------+----------+-------------------+  RIGHT     Compressibility Phasicity Spontaneity Properties Summary              +---------+---------------+---------+-----------+----------+-------------------+  CFV       Full            Yes       Yes                                         +---------+---------------+---------+-----------+----------+-------------------+  SFJ       Full                                                                  +---------+---------------+---------+-----------+----------+-------------------+  FV Prox   Full                                                                   +---------+---------------+---------+-----------+----------+-------------------+  FV Mid    Full                                                                  +---------+---------------+---------+-----------+----------+-------------------+  FV Distal Full                                                                  +---------+---------------+---------+-----------+----------+-------------------+  POP       Full            Yes       Yes                                         +---------+---------------+---------+-----------+----------+-------------------+  PTV       Full                                                                  +---------+---------------+---------+-----------+----------+-------------------+  PERO                                                       Not visualized well  +---------+---------------+---------+-----------+----------+-------------------+   +---------+---------------+---------+-----------+----------+-------+  LEFT      Compressibility Phasicity Spontaneity Properties Summary  +---------+---------------+---------+-----------+----------+-------+  CFV       Full            Yes       Yes                             +---------+---------------+---------+-----------+----------+-------+  SFJ       Full                                                      +---------+---------------+---------+-----------+----------+-------+  FV Prox   Full                                                      +---------+---------------+---------+-----------+----------+-------+  FV Mid    Full                                                      +---------+---------------+---------+-----------+----------+-------+  FV Distal Full                                                      +---------+---------------+---------+-----------+----------+-------+  POP       Full            Yes       Yes                              +---------+---------------+---------+-----------+----------+-------+  PTV       Full                                                      +---------+---------------+---------+-----------+----------+-------+  PERO      Full                                                      +---------+---------------+---------+-----------+----------+-------+     Summary: Right: There is no evidence of deep vein thrombosis in the lower extremity. No cystic structure found in the popliteal fossa. Left: There is no evidence of deep vein thrombosis in the lower extremity. No cystic  structure found in the popliteal fossa.  *See table(s) above for measurements and observations. Electronically signed by Deitra Mayo MD on 11/21/2018 at 2:49:58 PM.    Final    [place] Ekg Site Rite  Result Date: 11/20/2018 If Site Rite image not attached, placement could not be confirmed due to current cardiac rhythm.  [place] Ekg Site Rite  Result Date: 11/19/2018 If [facility] image not attached, placement could not be confirmed due to current cardiac rhythm.

## 2018-11-23 NOTE — Progress Notes (Signed)
Occupational Therapy Treatment Patient Details Name: Andrew Larsen MRN: [id number] DOB: 1941/03/16 Today's Date: 11/23/2018    History of present illness Pt is a 78 y.o. male admitted 11/03/18 with SOB and cough; found to have COVID-19. Hospital course complicated by worsening hypoxemia requirng transfer to ICU. 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   OT comments  Pt remains very motivated to work with therapy and return to PLOF. TOday was able to perform SPT to recliner, multiple sit<>stand from recliner, HEP for BUE with theraband (level 1) and grooming tasks seated in recliner. Pt min guard for transfers from elevated surfaces. It should be noted that Pt's fingers are cold and he has little earlobes, so getting an SpO2 is very difficult - next session attempt to use nelcor. During session, Pt DOE 3/4, and saturations decreasing to 67% on 8LHFNC, with seated rest break and pursed lip breathing able to bring back up to 92 within 3 min without adjusting the liters of O2. OT will continue to follow acutely and CIR remains best option and Pt is an excellent candidate.    Follow Up Recommendations  CIR;Supervision/Assistance - 24 hour(pending progress, possibly SNF)    Equipment Recommendations  3 in 1 bedside commode;Tub/shower seat    Recommendations for Other Services PT consult;Rehab consult    Precautions / Restrictions Precautions Precautions: Fall Precaution Comments: Both legs are weak from unknown itology myopathy, must monitor VS, on HFNC at rest, switch to NRB if needed for mobility Restrictions Weight Bearing Restrictions: No       Mobility Bed Mobility Overal bed mobility: Needs Assistance Bed Mobility: Supine to Sit     Supine to sit: Mod assist;HOB elevated     General bed mobility comments: assist for trunk elevation, and use of bed pad to  assisst with hips  Transfers Overall transfer level: Needs assistance Equipment used: Rolling walker (2 wheeled) Transfers: Sit to/from [facility] Sit to Stand: Min guard;From elevated surface(once from elevated bed, once from recliner) Stand pivot transfers: Min guard       General transfer comment: vc for safe hand placement with RW on power up and for sitting    Balance Overall balance assessment: History of Falls;Needs assistance Sitting-balance support: Feet supported Sitting balance-Leahy Scale: Good Sitting balance - Comments: EOB and in recliner   Standing balance support: Bilateral upper extremity supported;During functional activity Standing balance-Leahy Scale: Poor Standing balance comment: Reliant on UE support                           ADL either performed or assessed with clinical judgement   ADL Overall ADL's : Needs assistance/impaired     Grooming: Wash/dry hands;Wash/dry face;Oral care;Set up;Sitting Grooming Details (indicate cue type and reason): in recliner, able to open containers/packaging Upper Body Bathing: Moderate assistance;Sitting Upper Body Bathing Details (indicate cue type and reason): assist for back         Lower Body Dressing: Moderate assistance;Sit to/from stand Lower Body Dressing Details (indicate cue type and reason): when leaning fwd to access socks, becomes SOB DOE 3/4 Toilet Transfer: Min Insurance claims handler Details (indicate cue type and reason): vc for safe hand placement with standing and sitting         Functional mobility during ADLs: Min guard;Rolling walker;Cueing for safety(SPT only) General ADL Comments: assisted pt with transfer OOB  to recliner during this session for lunch and to promote upright , Pt DOE 3/4 with activity     Vision       Perception     Praxis      Cognition Arousal/Alertness: Awake/alert Behavior During Therapy: WFL for tasks  assessed/performed Overall Cognitive Status: Within Functional Limits for tasks assessed                                          Exercises Exercises: General Upper Extremity General Exercises - Upper Extremity Shoulder Flexion: Strengthening;Both;5 reps;Seated;Theraband Theraband Level (Shoulder Flexion): Level 1 (Yellow) Shoulder ABduction: Strengthening;Both;5 reps;Seated;Theraband Theraband Level (Shoulder Abduction): Level 1 (Yellow) Shoulder Horizontal ABduction: Strengthening;Both;5 reps;Seated;Theraband Theraband Level (Shoulder Horizontal Abduction): Level 1 (Yellow) Elbow Flexion: Strengthening;Both;5 reps;Seated;Theraband Theraband Level (Elbow Flexion): Level 1 (Yellow) Chair Push Up: AROM;5 reps;Seated   Shoulder Instructions       General Comments very hard to get SpO2 reading due to cold fingers and very small earlobe - good candidate for Nelcor???    Pertinent Vitals/ Pain       Pain Assessment: Faces Faces Pain Scale: Hurts a little bit Pain Location: Generalized, bed sore Pain Descriptors / Indicators: Discomfort Pain Intervention(s): Monitored during session;Repositioned;Other (comment)(extra pillows in recliner)  Home Living                                          Prior Functioning/Environment              Frequency  Min 3X/week        Progress Toward Goals  OT Goals(current goals can now be found in the care plan section)  Progress towards OT goals: Progressing toward goals  Acute Rehab OT Goals Patient Stated Goal: Return home to family OT Goal Formulation: With patient Time For Goal Achievement: 12/02/18 Potential to Achieve Goals: Good  Plan Discharge plan remains appropriate    Co-evaluation                 AM-PAC OT "6 Clicks" Daily Activity     Outcome Measure   Help from another person eating meals?: None Help from another person taking care of personal grooming?: A Little Help  from another person toileting, which includes using toliet, bedpan, or urinal?: A Lot Help from another person bathing (including washing, rinsing, drying)?: A Lot Help from another person to put on and taking off regular upper body clothing?: A Little Help from another person to put on and taking off regular lower body clothing?: A Lot 6 Click Score: 16    End of Session Equipment Utilized During Treatment: Oxygen;Rolling walker(8L via HFNC)  OT Visit Diagnosis: Unsteadiness on feet (R26.81);Other abnormalities of gait and mobility (R26.89);Muscle weakness (generalized) (M62.81)   Activity Tolerance Patient tolerated treatment well   Patient Left in chair;with call bell/phone within reach   Nurse Communication Mobility status        Time: 3662-9476 OT Time Calculation (min): 32 min  Charges: OT General Charges $OT Visit: 1 Visit OT Treatments $Self Care/Home Management : 8-22 mins $Therapeutic Exercise: 8-22 mins  Hulda Humphrey OTR/L Acute Rehabilitation Services Pager: [phone] Office: [place] 11/23/2018, 12:28 PM

## 2018-11-23 NOTE — Progress Notes (Signed)
Rehab Admissions Coordinator Note:  Patient was screened by Cleatrice Burke for appropriateness for an Inpatient Acute Rehab Consult per change in therapy recommendations to possible CIR pending progress. Noted repeat COVID testing today.   At this time, we are recommending Inpatient Rehab consult. Please place order.  Cleatrice Burke RN MSN 11/23/2018, 10:53 AM  I can be reached at [phone].

## 2018-11-23 NOTE — Consult Note (Signed)
[facility] Nurse wound consult note Reason for Consult:Stage 2 open area on left medial buttock at gluteal cleft.  I have discussed the area with both bedside RN, Christy and Dr. Sloan Leiter.  I appreciate the photograph provided to me by the bedside RN and via text to my personal phone and have deleted it following this consultation. All meas to protect the patient privacy with the technology assisted assessment were employed. It is noted that the patient has a history of OSA and uses HOB elevation to assist his breathing, increasing pressure on this lower buttock region. Wound type:Pressure Pressure Injury POA: No Measurement:2cm x 2cm x 0.2cm Wound bed:Red, moist with basement cell membrane (capillary buds) noted. Minute amount of yellow fibrinous tissue noted. Drainage (amount, consistency, odor) Small amount serous Periwound: intact  Dressing procedure/placement/frequency: I provided this consultation today via technology with assistance from both Dr. Sloan Leiter and the patient's bedside nurse, Alyse Low, RN.  Their assistance is appreciated. A photograph was sent to this writer to verify verbal assessment. The POC is provided for topical care including cleansing, topping with a size-appropriate piece of xeroform gauze topped with a dry gauze 2x2 and covered with our house silicone foam dressing.  Patient to offload this area by attempting to lie on his alternating sides while in bed, minimizing time in the supine position. A pressure redistribution chair pad is provided for his OOB use while in a chair.  [facility] nursing team will not follow, but will remain available to this patient, the nursing and medical teams.  Please re-consult if needed. Thanks, Maudie Flakes, MSN, RN, [place], Arther Abbott  Pager# [phone]

## 2018-11-24 LAB — CBC
HCT: 43.8 % (ref 39.0–52.0)
Hemoglobin: 14.6 g/dL (ref 13.0–17.0)
MCH: 30 pg (ref 26.0–34.0)
MCHC: 33.3 g/dL (ref 30.0–36.0)
MCV: 90.1 fL (ref 80.0–100.0)
Platelets: 129 10*3/uL — ABNORMAL LOW (ref 150–400)
RBC: 4.86 MIL/uL (ref 4.22–5.81)
RDW: 14.1 % (ref 11.5–15.5)
WBC: 8.2 10*3/uL (ref 4.0–10.5)
nRBC: 0 % (ref 0.0–0.2)

## 2018-11-24 LAB — COMPREHENSIVE METABOLIC PANEL
ALT: 36 U/L (ref 0–44)
AST: 17 U/L (ref 15–41)
Albumin: 3.3 g/dL — ABNORMAL LOW (ref 3.5–5.0)
Alkaline Phosphatase: 63 U/L (ref 38–126)
Anion gap: 11 (ref 5–15)
BUN: 35 mg/dL — ABNORMAL HIGH (ref 8–23)
CO2: 28 mmol/L (ref 22–32)
Calcium: 8.7 mg/dL — ABNORMAL LOW (ref 8.9–10.3)
Chloride: 96 mmol/L — ABNORMAL LOW (ref 98–111)
Creatinine, Ser: 1.38 mg/dL — ABNORMAL HIGH (ref 0.61–1.24)
GFR calc Af Amer: 57 mL/min — ABNORMAL LOW (ref >60)
GFR calc non Af Amer: 49 mL/min — ABNORMAL LOW (ref >60)
Glucose, Bld: 68 mg/dL — ABNORMAL LOW (ref 70–99)
Potassium: 4.3 mmol/L (ref 3.5–5.1)
Sodium: 135 mmol/L (ref 135–145)
Total Bilirubin: 0.9 mg/dL (ref 0.3–1.2)
Total Protein: 5.7 g/dL — ABNORMAL LOW (ref 6.5–8.1)

## 2018-11-24 LAB — GLUCOSE, CAPILLARY
Glucose-Capillary: 212 mg/dL — ABNORMAL HIGH (ref 70–99)
Glucose-Capillary: 51 mg/dL — ABNORMAL LOW (ref 70–99)
Glucose-Capillary: 55 mg/dL — ABNORMAL LOW (ref 70–99)
Glucose-Capillary: 140 mg/dL — ABNORMAL HIGH (ref 70–99)
Glucose-Capillary: 45 mg/dL — ABNORMAL LOW (ref 70–99)
Glucose-Capillary: 209 mg/dL — ABNORMAL HIGH (ref 70–99)
Glucose-Capillary: 422 mg/dL — ABNORMAL HIGH (ref 70–99)
Glucose-Capillary: 370 mg/dL — ABNORMAL HIGH (ref 70–99)

## 2018-11-24 LAB — C-REACTIVE PROTEIN: CRP: <0.8 mg/dL (ref <1.0)

## 2018-11-24 LAB — FERRITIN: Ferritin: 300 ng/mL (ref 24–336)

## 2018-11-24 LAB — D-DIMER, QUANTITATIVE: D-Dimer, Quant: 1.72 ug/mL-FEU — ABNORMAL HIGH (ref 0.00–0.50)

## 2018-11-24 MED ORDER — INSULIN ASPART PROT & ASPART (70-30 MIX) 100 UNIT/ML ~~LOC~~ SUSP
10.0000 [IU] | Freq: Every day | SUBCUTANEOUS | Status: DC
  Administered 2018-11-24 – 2018-11-26 (×3): 10 [IU] via SUBCUTANEOUS

## 2018-11-24 MED ORDER — DEXTROSE 50 % IV SOLN
INTRAVENOUS | Status: AC
  Administered 2018-11-24: 09:00:00
  Filled 2018-11-24: qty 50

## 2018-11-24 MED ORDER — PREDNISONE 20 MG PO TABS
20.0000 mg | ORAL_TABLET | Freq: Every day | ORAL | Status: DC
  Administered 2018-11-25 – 2018-11-26 (×2): 20 mg via ORAL
  Filled 2018-11-24 (×2): qty 1

## 2018-11-24 MED ORDER — FUROSEMIDE 10 MG/ML IJ SOLN
40.0000 mg | Freq: Once | INTRAMUSCULAR | Status: AC
  Administered 2018-11-24: 40 mg via INTRAVENOUS
  Filled 2018-11-24: qty 4

## 2018-11-24 MED ORDER — INSULIN ASPART PROT & ASPART (70-30 MIX) 100 UNIT/ML ~~LOC~~ SUSP
25.0000 [IU] | Freq: Every day | SUBCUTANEOUS | Status: DC
  Administered 2018-11-25 – 2018-11-27 (×3): 25 [IU] via SUBCUTANEOUS
  Filled 2018-11-24: qty 10

## 2018-11-24 NOTE — Progress Notes (Signed)
Occupational Therapy Treatment Patient Details Name: Andrew Larsen MRN: [id number] DOB: 05/01/1940 Today's Date: 11/24/2018    History of present illness Pt is a 78 y.o. male admitted 11/03/18 with SOB and cough; found to have COVID-19. Hospital course complicated by worsening hypoxemia requirng transfer to ICU. 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   OT comments  Pt progressing towards established OT goals. Despite significant fatigue and SOB, pt highly motivated to participate in therapy. At rest, supine in bed, pt SpO2 90-89% on 6L O2 via HFNC. Pt performing stand pivot transfer to [facility] with Min A and RW; SpO2 dropping to 83% and elevated pt to 10L O2 via HFNC for recovery; required significant rest break. While performing toileting, pt tolerated 8L O2. Pt performing stand pivot to recliner with Min A and RW. Continue to recommend dc to CIR and will continue to follow acutely as admitted.   RR elevating to 33 and HR elevating to 120s with activity.   Follow Up Recommendations  CIR;Supervision/Assistance - 24 hour    Equipment Recommendations  3 in 1 bedside commode;Tub/shower seat    Recommendations for Other Services PT consult;Rehab consult    Precautions / Restrictions Precautions Precautions: Fall Precaution Comments: Both legs are weak from unknown itology myopathy, must monitor VS Restrictions Weight Bearing Restrictions: No       Mobility Bed Mobility Overal bed mobility: Needs Assistance Bed Mobility: Supine to Sit     Supine to sit: Min assist;+2 for safety/equipment;HOB elevated     General bed mobility comments: Min A for trunk support   Transfers Overall transfer level: Needs assistance Equipment used: Rolling walker (2 wheeled) Transfers: Sit to/from [facility] Sit to Stand: Min guard;From elevated surface(once  from elevated bed, once from [facility]) Stand pivot transfers: Min assist;From elevated surface       General transfer comment: vc for safe hand placement with RW on power up and for sitting    Balance Overall balance assessment: History of Falls;Needs assistance Sitting-balance support: Feet supported Sitting balance-Leahy Scale: Good Sitting balance - Comments: EOB and in recliner   Standing balance support: Bilateral upper extremity supported;During functional activity Standing balance-Leahy Scale: Poor Standing balance comment: Reliant on UE support                           ADL either performed or assessed with clinical judgement   ADL Overall ADL's : Needs assistance/impaired     Grooming: Wash/dry hands;Set up;Sitting Grooming Details (indicate cue type and reason): Providing pt with hand sanitizer             Lower Body Dressing: Maximal assistance Lower Body Dressing Details (indicate cue type and reason): Assist to don socks as pt fatigued from toileting Toilet Transfer: Minimal assistance;+2 for safety/equipment;RW;BSC Toilet Transfer Details (indicate cue type and reason): Min A for balance and safety in pivot to [facility] Toileting- Clothing Manipulation and Hygiene: Supervision/safety;Set up;Sitting/lateral lean Toileting - Clothing Manipulation Details (indicate cue type and reason): Pt able to complete peri care while laterally leaning on toilet after BM     Functional mobility during ADLs: Min guard;Rolling walker;Cueing for safety(SPT only) General ADL Comments: Pt performing toileting and stand pivot to [facility] and then recliner. Pt SpO2 dropping to 82% on 8 and then 10L O2 via HFNC     Vision  Perception     Praxis      Cognition Arousal/Alertness: Awake/alert Behavior During Therapy: WFL for tasks assessed/performed Overall Cognitive Status: Within Functional Limits for tasks assessed                                 General  Comments: Pt making jokes this session and occasionally smiling        Exercises     Shoulder Instructions       General Comments SpO2 90-89% on 6L via HFNC at rest. Requiring 10L O2 for mobility to [facility]. SpO2 dropping to 83% and pt requiring seated rest break to elevate SpO2. Left pt on 7L at end of session and notified RN    Pertinent Vitals/ Pain       Pain Assessment: Faces Faces Pain Scale: Hurts a little bit Pain Location: Generalized, bed sore Pain Descriptors / Indicators: Discomfort Pain Intervention(s): Monitored during session;Limited activity within patient's tolerance;Repositioned  Home Living                                          Prior Functioning/Environment              Frequency  Min 3X/week        Progress Toward Goals  OT Goals(current goals can now be found in the care plan section)  Progress towards OT goals: Progressing toward goals  Acute Rehab OT Goals Patient Stated Goal: Return home to family OT Goal Formulation: With patient Time For Goal Achievement: 12/02/18 Potential to Achieve Goals: Good ADL Goals Pt Will Perform Grooming: with min guard assist;standing Pt Will Perform Lower Body Dressing: with min guard assist;with adaptive equipment;sit to/from stand Pt Will Transfer to Toilet: with min guard assist;bedside commode;ambulating Pt Will Perform Toileting - Clothing Manipulation and hygiene: with min guard assist;sit to/from stand;sitting/lateral leans Pt Will Perform Tub/Shower Transfer: with min assist;shower seat;ambulating;Tub transfer  Plan Discharge plan remains appropriate    Co-evaluation    PT/OT/SLP Co-Evaluation/Treatment: Yes Reason for Co-Treatment: For patient/therapist safety;To address functional/ADL transfers   OT goals addressed during session: ADL's and self-care      AM-PAC OT "6 Clicks" Daily Activity     Outcome Measure   Help from another person eating meals?: None Help from  another person taking care of personal grooming?: A Little Help from another person toileting, which includes using toliet, bedpan, or urinal?: A Lot Help from another person bathing (including washing, rinsing, drying)?: A Lot Help from another person to put on and taking off regular upper body clothing?: A Little Help from another person to put on and taking off regular lower body clothing?: A Lot 6 Click Score: 16    End of Session Equipment Utilized During Treatment: Oxygen;Rolling walker(6-10L via HFNC)  OT Visit Diagnosis: Unsteadiness on feet (R26.81);Other abnormalities of gait and mobility (R26.89);Muscle weakness (generalized) (M62.81)   Activity Tolerance Patient tolerated treatment well   Patient Left in chair;with call bell/phone within reach   Nurse Communication Mobility status        Time: 1497-0263 OT Time Calculation (min): 36 min  Charges: OT General Charges $OT Visit: 1 Visit OT Treatments $Self Care/Home Management : 8-22 mins  [place], OTR/L Acute Rehab Pager: [phone] Office: [place] 11/24/2018, 1:33 PM

## 2018-11-24 NOTE — Progress Notes (Signed)
Inpatient Diabetes Program Recommendations  AACE/ADA: New Consensus Statement on Inpatient Glycemic Control (2015)  Target Ranges:  Prepandial:   less than 140 mg/dL      Peak postprandial:   less than 180 mg/dL (1-2 hours)      Critically ill patients:  140 - 180 mg/dL   Lab Results  Component Value Date   GLUCAP 51 (L) 11/24/2018   HGBA1C 7.3 (H) 11/08/2018    Review of Glycemic Control Results for Andrew Larsen, Andrew Larsen (MRN [id number]) as of 11/24/2018 08:49  Ref. Range 11/22/2018 08:58 11/22/2018 12:00 11/22/2018 17:28 11/22/2018 21:45 11/23/2018 11:56 11/23/2018 16:35 11/23/2018 21:56 11/24/2018 07:52 11/24/2018 08:18  Glucose-Capillary Latest Ref Range: 70 - 99 mg/dL 89 321 (H) 350 (H) 272 (H) 260 (H) 308 (H) 235 (H) 55 (L) 51 (L)   Diabetes history: DM 2 Outpatient Diabetes medications:  Novolin 70/30- 50 units in AM and 30 units in PM Current orders for Inpatient glycemic control:  Novolog 70/30 20 units q AM and 10 units q PM Novolog resistant tid with meals Solumedrol 40 mg q AM Inpatient Diabetes Program Recommendations:    Note low blood sugar this AM.  PM dose of 70/30 reduced.  -May consider reducing Novolog correction tid moderate tid with meals.  -Consider increasing AM dose of 70/30 to 30 units q AM.   Thanks,  Adah Perl, RN, BC-ADM Inpatient Diabetes Coordinator Pager [phone] (8a-5p)

## 2018-11-24 NOTE — Progress Notes (Addendum)
Physical Therapy Treatment Patient Details Name: Andrew Larsen MRN: [id number] DOB: 1940-07-13 Today's Date: 11/24/2018    History of Present Illness Pt is a 78 y.o. male admitted 11/03/18 with SOB and cough; found to have COVID-19. Hospital course complicated by worsening hypoxemia requirng transfer to ICU. 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    PT Comments    Patient resting in bed on 6 L HFNC at 88%. Mobilized to sitting and to [name] on 8 then 10 L to keep SaO2 > 75%. HR 95-134, RR 25-33. Patient  Is moving better. Will attempt to progress ambulation as tolerated.   Follow Up Recommendations  CIR;Supervision for mobility/OOB     Equipment Recommendations  None recommended by PT    Recommendations for Other Services Rehab consult     Precautions / Restrictions Precautions Precautions: Fall Precaution Comments: Both legs are weak from unknown etology myopathy, must monitor VS Restrictions Weight Bearing Restrictions: No     Mobility  Bed Mobility Overal bed mobility: Needs Assistance Bed Mobility: Supine to Sit     Supine to sit: Min assist;+2 for safety/equipment;HOB elevated     General bed mobility comments: Min A for trunk support ,extra time  , rest breaks between transitions  Transfers Overall transfer level: Needs assistance Equipment used: Rolling walker (2 wheeled) Transfers: Sit to/from [facility] Sit to Stand: Min guard;From elevated surface Stand pivot transfers: Min assist;From elevated surface       General transfer comment: vc for safe hand placement with RW on power up and for sitting, decreased control of descent. Patient indicates that he does not need RW, set it a little away from pt. , but patient supported on it.  Ambulation/Gait             General Gait Details: Needed BSC so no energy to  ambulate   Stairs             Wheelchair Mobility    Modified Rankin (Stroke Patients Only)       Balance Overall balance assessment: History of Falls;Needs assistance Sitting-balance support: Feet supported Sitting balance-Leahy Scale: Good Sitting balance - Comments: EOB and in recliner   Standing balance support: Bilateral upper extremity supported;During functional activity Standing balance-Leahy Scale: Poor Standing balance comment: Reliant on UE support                            Cognition Arousal/Alertness: Awake/alert Behavior During Therapy: WFL for tasks assessed/performed Overall Cognitive Status: Within Functional Limits for tasks assessed                                 General Comments: Pt making jokes this session and occasionally smiling      Exercises      General Comments General comments (skin integrity, edema, etc.): SpO2 90-89% on 6L via HFNC at rest. Requiring 10L O2 for mobility to [facility]. SpO2 dropping to 83% and pt requiring seated rest break to elevate SpO2. Left pt on 7L at end of session and notified RN      Pertinent Vitals/Pain Pain Assessment: Faces Faces Pain Scale: Hurts a little bit Pain Location: Generalized, bed sore Pain Descriptors / Indicators: Discomfort Pain Intervention(s): Monitored during session(applied cream)   complains of bil foot pain from  neuropathy when cold. Placed 2 socks and warm blankets on feet.    Home Living                      Prior Function            PT Goals (current goals can now be found in the care plan section) Acute Rehab PT Goals Patient Stated Goal: Return home to family Progress towards PT goals: Progressing toward goals    Frequency    Min 3X/week      PT Plan Current plan remains appropriate    Co-evaluation PT/OT/SLP Co-Evaluation/Treatment: Yes Reason for Co-Treatment: Complexity of the patient's impairments (multi-system  involvement);For patient/therapist safety PT goals addressed during session: Mobility/safety with mobility OT goals addressed during session: ADL's and self-care      AM-PAC PT "6 Clicks" Mobility   Outcome Measure  Help needed turning from your back to your side while in a flat bed without using bedrails?: A Little Help needed moving from lying on your back to sitting on the side of a flat bed without using bedrails?: A Little Help needed moving to and from a bed to a chair (including a wheelchair)?: A Little Help needed standing up from a chair using your arms (e.g., wheelchair or bedside chair)?: A Lot Help needed to walk in hospital room?: A Lot Help needed climbing 3-5 steps with a railing? : A Lot 6 Click Score: 15    End of Session Equipment Utilized During Treatment: Oxygen(from 6 L to 8 to 10 L HFNC) Activity Tolerance: Patient tolerated treatment well Patient left: in chair;with call bell/phone within reach Nurse Communication: Mobility status PT Visit Diagnosis: Unsteadiness on feet (R26.81);Muscle weakness (generalized) (M62.81);Repeated falls (R29.6)     Time: 0051-1021 PT Time Calculation (min) (ACUTE ONLY): 38 min  Charges:  $Gait Training: 8-22 mins                     [place]  Office [phone]'    Claretha Cooper 11/24/2018, 1:45 PM

## 2018-11-24 NOTE — Progress Notes (Signed)
Hypoglycemic Event  CBG: 55   Treatment: 8 oz juice/soda and D50 25 mL (12.5 gm)  Symptoms: None  Follow-up CBG: Time: 0928 CBG Result: 140   Possible Reasons for Event: Unknown  Comments/MD notified: Pt unable to raise CBG after eating breakfast and drinking 8 oz of juice. 25 ml D50 administered. Dr. Sloan Leiter made aware.    Dorina Hoyer

## 2018-11-24 NOTE — Progress Notes (Signed)
PROGRESS NOTE                                                                                                                                                                                                             Patient Demographics:    Andrew Larsen, is a 78 y.o. male, DOB - 12-21-1940, ZOX:[id number]  Outpatient Primary MD for the patient is Velna Hatchet, MD    LOS - 77  Chief Complaint  Patient presents with   Covid 19   Cough       Brief Narrative: Patient is a 78 y.o. male with PMHx of DM-2, HTN, BPH, PAD, OSA-who presented with dry cough, shortness of breath-was found to have acute hypoxic respiratory failure in the setting of COVID-19 pneumonia.  Hospital course complicated by worsening hypoxemia-necessitating transfer to the intensive care unit.  Upon further stability-he was transferred back to progressive care unit.   Subjective:    Andrew Larsen feels better-he thinks his shortness of breath has markedly improved over the past day or 2 as he is now able to sleep through the night and feels much better than the past few days.  On 6 L this morning.   Assessment  & Plan :   Acute Hypoxic Resp Failure due to Covid 19 Viral pneumonia: Clinical improvement continues-FiO2 requirements slowly coming down-on 6 L high flow oxygen this morning.  Continue IV steroids but we will start tapering gradually-repeat 1 more dose of IV Lasix today-although no signs of volume overload-we will like to keep him in negative balance as much as possible.  Continue at attempts to slowly titrate down FiO2.  He is very deconditioned-and is still short of breath with minimal ambulation-recommendations from PT are for CIR (repeat COVID-19 test pending-ordered on 7/26)  COVID-19 Labs:  Recent Labs    11/22/18 0042 11/23/18 0500 11/24/18 0615  DDIMER 2.24* 1.77* 1.72*  FERRITIN 349* 312 300  CRP 0.9 <0.8 <0.8    Lab Results   Component Value Date   SARSCOV2NAA POSITIVE (A) 11/03/2018     COVID-19 Medications: 7/10 and 7/11>> Actemra 7/11>> convalescent plasma 7/10-7/14>> Remdesivir 7/8>>7/27>>Solumedrol, Prednisone 20 mg 7/27>>  Mild epistaxis: Occurred on 7/22-resolved.    Hyperkalemia: Occurred on 7/24-treated with IV insulin/dextrose and 1 dose of Lokelma-resolved.    Chronic diastolic heart failure: Clinically compensated-repeat 1 more  dose of Lasix today-no signs of volume overload but would like to keep this patient in a negative balance today as well  DM-2 (A1c 7.3 on 11/08/2018): Has had another hypoglycemic episode this morning-decrease p.m. 70/30 to 10 units, since has evening hyperglycemia-increase a.m. 70/30 insulin to 25 units.   Follow  CKD stage III: Creatinine close to usual baseline-follow periodically  HTN: Currently controlled-not on any antihypertensives-follow.  CAD: No anginal symptoms-continue aspirin  PAD: Appears stable-continue cilostazol and aspirin  BPH: Continue Flomax  OSA: Unable to use CPAP-continue high flow oxygen  Diabetic peripheral neuropathy: Continue Neurontin  Recently diagnosed inclusion body myositis: Follow with neurology at [facility]-Dr. Warnell Forester ([phone]  Deconditioning/debility: Secondary to acute illness-also has inclusion body myositis as baseline.  PT/OT recommending CIR on discharge (ordered COVID-19 PCR 7/26-pending results).    ABG:    Component Value Date/Time   PHART 7.459 (H) 11/07/2018 1312   PCO2ART 30.5 (L) 11/07/2018 1312   PO2ART 46.0 (L) 11/07/2018 1312   HCO3 21.7 11/07/2018 1312   TCO2 23 11/07/2018 1312   ACIDBASEDEF 1.0 11/07/2018 1312   O2SAT 85.0 11/07/2018 1312    Condition -  Guarded  Family Communication  : Spoke with patient's daughter over the phone on 7/27  Code Status :  Full Code  Diet :  Diet Order            Diet regular Room service appropriate? Yes; Fluid consistency: Thin  Diet effective now                Disposition Plan  :  Remain inpatient  Consults  :  PCCM  Procedures  :   7/10>>7/22 left IJ CVL 7/24>>PICC 7/24>> lower extremity Doppler negative for DVT  DVT Prophylaxis  :  Lovenox -watch d-dimer   Lab Results  Component Value Date   PLT 129 (L) 11/24/2018    Inpatient Medications  Scheduled Meds:  sodium chloride   Intravenous Once   aspirin  81 mg Oral QHS   bisacodyl  10 mg Oral Once   brimonidine  1 drop Both Eyes BID   Chlorhexidine Gluconate Cloth  6 each Topical Daily   cilostazol  100 mg Oral BID   enoxaparin (LOVENOX) injection  60 mg Subcutaneous Q24H   famotidine  20 mg Oral QHS   folic acid  1 mg Oral Daily   gabapentin  600 mg Oral TID   insulin aspart  0-20 Units Subcutaneous TID WC   insulin aspart protamine- aspart  10 Units Subcutaneous Q supper   insulin aspart protamine- aspart  20 Units Subcutaneous Q breakfast   Ipratropium-Albuterol  2 puff Inhalation QID   latanoprost  1 drop Both Eyes QHS   mouth rinse  15 mL Mouth Rinse BID   methylPREDNISolone (SOLU-MEDROL) injection  40 mg Intravenous Daily   oxybutynin  5 mg Oral BID   polyethylene glycol  17 g Oral Once   senna-docusate  2 tablet Oral BID   sodium chloride flush  10-40 mL Intracatheter Q12H   sodium chloride flush  3 mL Intravenous Q12H   tamsulosin  0.4 mg Oral Daily   thiamine  100 mg Oral Daily   timolol  1 drop Both Eyes BID   vitamin C  500 mg Oral Daily   zinc sulfate  220 mg Oral Daily   Continuous Infusions: PRN Meds:.acetaminophen, benzonatate, chlorpheniramine-HYDROcodone, guaiFENesin-dextromethorphan, ondansetron **OR** ondansetron (ZOFRAN) IV, senna-docusate, sodium chloride, sodium chloride flush  Antibiotics  :    Anti-infectives (  From admission, onward)   Start     Dose/Rate Route Frequency Ordered Stop   11/08/18 1330  remdesivir 100 mg in sodium chloride 0.9 % 250 mL IVPB     100 mg 500 mL/hr over 30 Minutes Intravenous Every 24  hours 11/07/18 1229 11/11/18 1304   11/07/18 1330  remdesivir 200 mg in sodium chloride 0.9 % 250 mL IVPB     200 mg 500 mL/hr over 30 Minutes Intravenous Once 11/07/18 1229 11/08/18 1529       Time Spent in minutes  25   Oren Binet M.D on 11/24/2018 at 9:44 AM  To page go to [email or web address] - use universal password  [facility] -  Office  [phone]   See all Orders from today for further details   Admit date - 11/03/2018    19    Objective:   Vitals:   11/24/18 0047 11/24/18 0425 11/24/18 0500 11/24/18 0751  BP: 92/75 102/70  119/72  Pulse: (!) 112 92  92  Resp:  20    Temp: 97.7 F (36.5 C) 98.1 F (36.7 C)  97.6 F (36.4 C)  TempSrc: Oral Oral  Oral  SpO2: 96% 100%  95%  Weight:   115.1 kg   Height:        Wt Readings from Last 3 Encounters:  11/24/18 115.1 kg  05/06/18 117.9 kg  04/28/18 122.8 kg    No intake or output data in the 24 hours ending 11/24/18 0944  Physical Exam Gen Exam:Alert awake-not in any distress HEENT:atraumatic, normocephalic Chest: B/L clear to auscultation anteriorly CVS:S1S2 regular Abdomen:soft non tender, non distended Extremities:no edema Neurology: Non focal Skin: no rash   Data Review:    CBC Recent Labs  Lab 11/19/18 0609 11/21/18 0345 11/22/18 0042 11/23/18 0500 11/24/18 0615  WBC 14.1* 8.9 13.0* 8.1 8.2  HGB 16.1 16.2 15.8 14.6 14.6  HCT 48.6 49.9 46.3 43.5 43.8  PLT 208 160 178 145* 129*  MCV 90.0 92.1 90.3 89.9 90.1  MCH 29.8 29.9 30.8 30.2 30.0  MCHC 33.1 32.5 34.1 33.6 33.3  RDW 13.8 14.1 14.0 13.8 14.1    Chemistries  Recent Labs  Lab 11/19/18 0609 11/21/18 0345 11/21/18 1605 11/22/18 0042 11/23/18 0500 11/24/18 0615  NA 134* 131* 131* 133* 132* 135  K 4.5 6.0* 4.9 4.3 4.7 4.3  CL 96* 97* 92* 94* 95* 96*  CO2 28 25 24 26 27 28   GLUCOSE 108* 175* 348* 120* 231* 68*  BUN 41* 36* 39* 42* 36* 35*  CREATININE 1.55* 1.45* 1.61* 1.50* 1.24 1.38*  CALCIUM 9.1 8.7* 9.1 9.4 9.0  8.7*  AST 31 26  --  21 17 17   ALT 61* 53*  --  45* 38 36  ALKPHOS 95 90  --  85 67 63  BILITOT 0.8 0.7  --  0.6 0.9 0.9   ------------------------------------------------------------------------------------------------------------------ No results for input(s): CHOL, HDL, LDLCALC, TRIG, CHOLHDL, LDLDIRECT in the last 72 hours.  Lab Results  Component Value Date   HGBA1C 7.3 (H) 11/08/2018   ------------------------------------------------------------------------------------------------------------------ No results for input(s): TSH, T4TOTAL, T3FREE, THYROIDAB in the last 72 hours.  Invalid input(s): FREET3 ------------------------------------------------------------------------------------------------------------------ Recent Labs    11/23/18 0500 11/24/18 0615  FERRITIN 312 300    Coagulation profile No results for input(s): INR, PROTIME in the last 168 hours.  Recent Labs    11/23/18 0500 11/24/18 0615  DDIMER 1.77* 1.72*    Cardiac Enzymes No results for input(s): CKMB, TROPONINI, MYOGLOBIN in  the last 168 hours.  Invalid input(s): CK ------------------------------------------------------------------------------------------------------------------    Component Value Date/Time   BNP 29.4 11/21/2018 0345    Micro Results No results found for this or any previous visit (from the past 240 hour(s)).  Radiology Reports Dg Chest Port 1 View  Result Date: 11/18/2018 CLINICAL DATA:  Hypoxia. EXAM: PORTABLE CHEST 1 VIEW COMPARISON:  11/13/2018. FINDINGS: Left IJ line noted stable position. Cardiomegaly. Diffuse bilateral severe pulmonary infiltrates/edema. No pleural effusion or pneumothorax. IMPRESSION: 1.  Left IJ line noted stable position. 2. Cardiomegaly. Diffuse bilateral severe pulmonary infiltrates/edema again noted. No interim change. Electronically Signed   By: [place]   On: 11/18/2018 16:16   Dg Chest Port 1 View  Result Date: 11/13/2018 CLINICAL  DATA:  Acute respiratory failure with hypoxia. EXAM: PORTABLE CHEST 1 VIEW COMPARISON:  11/11/2018 FINDINGS: Left IJ central venous catheter unchanged with tip obliquely oriented over the junction of the brachiocephalic vein to SVC. Patient is rotated to the left. Lungs are hypoinflated demonstrate interval worsening of hazy central airspace opacification right worse than left. No definite effusion. Mild stable cardiomegaly. Remainder of the exam is unchanged. IMPRESSION: Interval worsening bilateral hazy central airspace opacification right worse than left likely asymmetric interstitial edema and less likely infection. Stable cardiomegaly.  Left IJ central venous catheter unchanged. Electronically Signed   By: Marin Olp M.D.   On: 11/13/2018 08:59   Dg Chest Port 1 View  Result Date: 11/11/2018 CLINICAL DATA:  Acute respiratory failure due to hypoxemia. EXAM: PORTABLE CHEST 1 VIEW COMPARISON:  Radiograph November 10, 2018. FINDINGS: Stable cardiomediastinal silhouette. Left internal jugular catheter is unchanged in position. No pneumothorax or significant pleural effusion is noted. Stable bilateral lung opacities are noted concerning for edema or pneumonia. Bony thorax is unremarkable. IMPRESSION: Stable bilateral lung opacities as described above. Electronically Signed   By: Marijo Conception M.D.   On: 11/11/2018 07:34   Dg Chest Port 1 View  Result Date: 11/10/2018 CLINICAL DATA:  Pneumatocele of lung EXAM: PORTABLE CHEST 1 VIEW COMPARISON:  Radiograph 11/10/2018 FINDINGS: Central venous line with tip in the distal brachiocephalic vein unchanged. Stable cardiac silhouette. Fine multifocal airspace disease is slightly increased in density compared to prior as noted over the RIGHT hemidiaphragm and LEFT upper lobe. Low lung volumes. IMPRESSION: Mild increase in multifocal fine airspace disease. Electronically Signed   By: Suzy Bouchard M.D.   On: 11/10/2018 10:32   Dg Chest Port 1 View  Result Date:  11/10/2018 CLINICAL DATA:  78 year old male with COVID-19 pneumonia EXAM: PORTABLE CHEST 1 VIEW COMPARISON:  Prior chest x-ray 11/07/2018 FINDINGS: Stable position of left IJ central venous catheter. The catheter tip overlies the innominate vein. Improving appearance of multifocal patchy interstitial and airspace opacities bilaterally. There is a region of focal clearing in the left mid lung just superior to the cardiac margin which could represent a developing pneumatocele. No acute osseous abnormality. IMPRESSION: 1. Improving appearance of bilateral multifocal interstitial and airspace opacities compared to 11/07/2018. 2. Region of more focal and slightly rounded lucency in the left mid lung could represent a developing pneumatocele versus a region of more advanced clearing. 3. Stable position of left IJ central venous catheter. Electronically Signed   By: Jacqulynn Cadet M.D.   On: 11/10/2018 08:01   Dg Chest Port 1 View  Result Date: 11/07/2018 CLINICAL DATA:  Central line placement, confirm positioning. EXAM: PORTABLE CHEST 1 VIEW COMPARISON:  Numerous chest radiographs, most recently November 07, 2018, CTA  chest Sep 19, 2015. FINDINGS: Interval placement of a left internal jugular approach central venous catheter. Catheter tip terminates near the confluence of the brachiocephalic vein and superior vena cava, could be advanced to insure positioning within the lower SVC/cavoatrial junction. Widespread bilateral airspace opacities are again seen most pronounced in the right mid and lower lung. No pneumothorax or effusion is present. Cardiac silhouette remains enlarged. Cardiomediastinal contours are essentially unchanged from prior studies. No acute osseous or soft tissue abnormality. IMPRESSION: 1. Interval placement of a left internal jugular approach central venous catheter. Catheter tip terminates near the confluence of the brachiocephalic vein and superior vena cava, could be advanced 2-3 cm to ensure  positioning within the lower SVC/cavoatrial junction. 2. No procedural complication identified. 3. Unchanged diffuse bilateral airspace disease. Electronically Signed   By: MD Lovena Le   On: 11/07/2018 19:06   Dg Chest Port 1 View  Result Date: 11/07/2018 CLINICAL DATA:  Shortness of breath EXAM: PORTABLE CHEST 1 VIEW COMPARISON:  November 05, 2018 FINDINGS: The heart size and mediastinal contours are stable. The heart size is enlarged. Hazy opacities are identified in the right lung and to a lesser degree in the left lung, worse compared to prior exam. There is no pleural effusion. The visualized skeletal structures are unremarkable. IMPRESSION: Bilateral pulmonary infiltrates are slightly worse compared to prior exam of November 05, 2018. Electronically Signed   By: Abelardo Diesel M.D.   On: 11/07/2018 12:10   Dg Chest Port 1 View  Result Date: 11/05/2018 CLINICAL DATA:  Acute respiratory disease secondary to COVID-19. EXAM: PORTABLE CHEST 1 VIEW 5:11 a.m. COMPARISON:  11/03/2018 and 03/25/2016 FINDINGS: Faint bilateral pulmonary infiltrates appear slightly improved at the bases. Heart size and pulmonary vascularity are normal. No effusions. No bone abnormality. IMPRESSION: Slight improvement in faint bilateral pulmonary infiltrates. Electronically Signed   By: Lorriane Shire M.D.   On: 11/05/2018 05:50   Dg Chest Port 1 View  Result Date: 11/03/2018 CLINICAL DATA:  Cough.  COVID-19. EXAM: PORTABLE CHEST 1 VIEW COMPARISON:  03/25/2016. FINDINGS: Mediastinum hilar structures normal. Cardiomegaly with mild pulmonary venous congestion and bilateral interstitial prominence. CHF could present this fashion. Pneumonitis particularly in light of the patient's history of COVID-19 infection could also present this fashion. Small left pleural effusion cannot be excluded. IMPRESSION: 1. Cardiomegaly with mild pulmonary venous congestion and bilateral interstitial prominence. CHF could present in this fashion. Pneumonitis  particularly in light of the patient's history of COVID-19 infection could also present this fashion. 2.  Small left pleural effusion cannot be excluded. Electronically Signed   By: Marcello Moores  Register   On: 11/03/2018 14:31   Dg Chest Port 1v Same Day  Result Date: 11/19/2018 CLINICAL DATA:  Pneumonia in a patient who is COVID-19 positive. EXAM: PORTABLE CHEST 1 VIEW COMPARISON:  Single-view of the chest 11/18/2018 and 11/13/2018. FINDINGS: Left IJ catheter is unchanged. Extensive bilateral airspace disease persists without change given differences in technique. Heart size is normal. No pneumothorax or pleural effusion. IMPRESSION: No notable change in extensive bilateral airspace disease. Electronically Signed   By: Inge Rise M.D.   On: 11/19/2018 11:51   Vas [place] Lower Extremity Venous (dvt)  Result Date: 11/21/2018  Lower Venous Study Indications: Swelling, and Edema. Other Indications: Elevated D Dimer. Risk Factors: COVID 19 and obesity. Performing Technologist: Darlina Sicilian RDCS  Examination Guidelines: A complete evaluation includes B-mode imaging, spectral Doppler, color Doppler, and power Doppler as needed of all accessible portions of each vessel.  Bilateral testing is considered an integral part of a complete examination. Limited examinations for reoccurring indications may be performed as noted.  +---------+---------------+---------+-----------+----------+-------------------+  RIGHT     Compressibility Phasicity Spontaneity Properties Summary              +---------+---------------+---------+-----------+----------+-------------------+  CFV       Full            Yes       Yes                                         +---------+---------------+---------+-----------+----------+-------------------+  SFJ       Full                                                                  +---------+---------------+---------+-----------+----------+-------------------+  FV Prox   Full                                                                   +---------+---------------+---------+-----------+----------+-------------------+  FV Mid    Full                                                                  +---------+---------------+---------+-----------+----------+-------------------+  FV Distal Full                                                                  +---------+---------------+---------+-----------+----------+-------------------+  POP       Full            Yes       Yes                                         +---------+---------------+---------+-----------+----------+-------------------+  PTV       Full                                                                  +---------+---------------+---------+-----------+----------+-------------------+  PERO  Not visualized well  +---------+---------------+---------+-----------+----------+-------------------+   +---------+---------------+---------+-----------+----------+-------+  LEFT      Compressibility Phasicity Spontaneity Properties Summary  +---------+---------------+---------+-----------+----------+-------+  CFV       Full            Yes       Yes                             +---------+---------------+---------+-----------+----------+-------+  SFJ       Full                                                      +---------+---------------+---------+-----------+----------+-------+  FV Prox   Full                                                      +---------+---------------+---------+-----------+----------+-------+  FV Mid    Full                                                      +---------+---------------+---------+-----------+----------+-------+  FV Distal Full                                                      +---------+---------------+---------+-----------+----------+-------+  POP       Full            Yes       Yes                              +---------+---------------+---------+-----------+----------+-------+  PTV       Full                                                      +---------+---------------+---------+-----------+----------+-------+  PERO      Full                                                      +---------+---------------+---------+-----------+----------+-------+     Summary: Right: There is no evidence of deep vein thrombosis in the lower extremity. No cystic structure found in the popliteal fossa. Left: There is no evidence of deep vein thrombosis in the lower extremity. No cystic structure found in the popliteal fossa.  *See table(s) above for measurements and observations. Electronically signed by Deitra Mayo MD on 11/21/2018 at 2:49:58 PM.    Final    [place] Ekg Site Rite  Result Date: 11/20/2018 If Site Rite image not attached, placement could not be confirmed due to current cardiac  rhythm.  [place] Ekg Site Rite  Result Date: 11/19/2018 If [facility] image not attached, placement could not be confirmed due to current cardiac rhythm.

## 2018-11-24 NOTE — Progress Notes (Signed)
Inpatient Rehabilitation Admissions Coordinator  Inpatient rehab consult received. Noted COVID testing 7/26 with results pending. I will follow his case as we await tolerance for more intense therapies as well as post COVID protocol for possible admit.  Danne Baxter, RN, MSN Rehab Admissions Coordinator [phone] 11/24/2018 12:36 PM

## 2018-11-25 LAB — GLUCOSE, CAPILLARY
Glucose-Capillary: 211 mg/dL — ABNORMAL HIGH (ref 70–99)
Glucose-Capillary: 291 mg/dL — ABNORMAL HIGH (ref 70–99)
Glucose-Capillary: 290 mg/dL — ABNORMAL HIGH (ref 70–99)
Glucose-Capillary: 335 mg/dL — ABNORMAL HIGH (ref 70–99)

## 2018-11-25 LAB — COMPREHENSIVE METABOLIC PANEL
ALT: 34 U/L (ref 0–44)
AST: 16 U/L (ref 15–41)
Albumin: 3.1 g/dL — ABNORMAL LOW (ref 3.5–5.0)
Alkaline Phosphatase: 60 U/L (ref 38–126)
Anion gap: 9 (ref 5–15)
BUN: 35 mg/dL — ABNORMAL HIGH (ref 8–23)
CO2: 28 mmol/L (ref 22–32)
Calcium: 8.7 mg/dL — ABNORMAL LOW (ref 8.9–10.3)
Chloride: 94 mmol/L — ABNORMAL LOW (ref 98–111)
Creatinine, Ser: 1.34 mg/dL — ABNORMAL HIGH (ref 0.61–1.24)
GFR calc Af Amer: 59 mL/min — ABNORMAL LOW (ref >60)
GFR calc non Af Amer: 51 mL/min — ABNORMAL LOW (ref >60)
Glucose, Bld: 273 mg/dL — ABNORMAL HIGH (ref 70–99)
Potassium: 4.4 mmol/L (ref 3.5–5.1)
Sodium: 131 mmol/L — ABNORMAL LOW (ref 135–145)
Total Bilirubin: 0.9 mg/dL (ref 0.3–1.2)
Total Protein: 5.3 g/dL — ABNORMAL LOW (ref 6.5–8.1)

## 2018-11-25 LAB — CBC
HCT: 40.2 % (ref 39.0–52.0)
Hemoglobin: 14 g/dL (ref 13.0–17.0)
MCH: 31.4 pg (ref 26.0–34.0)
MCHC: 34.8 g/dL (ref 30.0–36.0)
MCV: 90.1 fL (ref 80.0–100.0)
Platelets: 126 10*3/uL — ABNORMAL LOW (ref 150–400)
RBC: 4.46 MIL/uL (ref 4.22–5.81)
RDW: 14.3 % (ref 11.5–15.5)
WBC: 7.4 10*3/uL (ref 4.0–10.5)
nRBC: 0 % (ref 0.0–0.2)

## 2018-11-25 LAB — C-REACTIVE PROTEIN: CRP: <0.8 mg/dL (ref <1.0)

## 2018-11-25 LAB — FERRITIN: Ferritin: 270 ng/mL (ref 24–336)

## 2018-11-25 LAB — D-DIMER, QUANTITATIVE: D-Dimer, Quant: 1.32 ug/mL-FEU — ABNORMAL HIGH (ref 0.00–0.50)

## 2018-11-25 MED ORDER — FUROSEMIDE 10 MG/ML IJ SOLN
40.0000 mg | Freq: Once | INTRAMUSCULAR | Status: AC
  Administered 2018-11-25: 40 mg via INTRAVENOUS
  Filled 2018-11-25: qty 4

## 2018-11-25 NOTE — Progress Notes (Addendum)
PROGRESS NOTE                                                                                                                                                                                                             Patient Demographics:    Andrew Larsen, is a 78 y.o. male, DOB - 1941/03/14, ZOX:[id number]  Outpatient Primary MD for the patient is Velna Hatchet, MD    LOS - 85  Chief Complaint  Patient presents with   Covid 19   Cough       Brief Narrative: Patient is a 78 y.o. male with PMHx of DM-2, HTN, BPH, PAD, OSA-who presented with dry cough, shortness of breath-was found to have acute hypoxic respiratory failure in the setting of COVID-19 pneumonia.  Hospital course complicated by worsening hypoxemia-necessitating transfer to the intensive care unit.  Upon further stability-he was transferred back to progressive care unit.  Has had slow improvement with gradual improvement in hypoxia and subsequently decreased oxygen requirements.  See below for further details   Subjective:    Andrew Larsen continues to feel better-less shortness of breath-he is down to 5 L high flow oxygen this morning-yesterday was on 6-7 L.   Assessment  & Plan :   Acute Hypoxic Resp Failure due to Covid 19 Viral pneumonia: Slow clinical improvement continues-FiO2 slowly coming down-down to 5 L this morning.  Continue tapering prednisone-repeat 1 more dose of IV Lasix today-we will keep him in a negative balance-although no signs of volume overload at this point. Continue at attempts to slowly titrate down FiO2.  He is very deconditioned-and is still short of breath with minimal ambulation-recommendations from PT are for CIR (repeat COVID-19 test pending-ordered on 7/26)  COVID-19 Labs:  Recent Labs    11/23/18 0500 11/24/18 0615 11/25/18 0450  DDIMER 1.77* 1.72* 1.32*  FERRITIN 312 300 270  CRP <0.8 <0.8 <0.8    Lab Results    Component Value Date   SARSCOV2NAA POSITIVE (A) 11/03/2018     COVID-19 Medications: 7/10 and 7/11>> Actemra 7/11>> convalescent plasma 7/10-7/14>> Remdesivir 7/8>>7/27>>Solumedrol, Prednisone 20 mg 7/27>>  Mild epistaxis: Occurred on 7/22-resolved.    Hyperkalemia: Occurred on 7/24-treated with IV insulin/dextrose and 1 dose of Lokelma-resolved.    Chronic diastolic heart failure: Clinically compensated-repeat 1 more dose of Lasix today-no signs of volume overload but  would like to keep this patient in a negative balance today as well  DM-2 (A1c 7.3 on 11/08/2018): No further morning hypoglycemic episodes-continue insulin 70/30 25 units in a.m. and 10 units p.m.  Follow.    CKD stage III: Creatinine close to usual baseline-follow periodically  HTN: Currently controlled-not on any antihypertensives-follow.  CAD: No anginal symptoms-continue aspirin  PAD: Appears stable-continue cilostazol and aspirin  BPH: Continue Flomax  OSA: Unable to use CPAP-continue high flow oxygen  Diabetic peripheral neuropathy: Continue Neurontin  Recently diagnosed inclusion body myositis: Follow with neurology at [facility]-Dr. Warnell Forester ([id number])  Deconditioning/debility: Secondary to acute illness-also has inclusion body myositis as baseline.  PT/OT recommending CIR on discharge (ordered COVID-19 PCR 7/26-pending results).    Stage 2 sacral decubitus:per wound care RN  ABG:    Component Value Date/Time   PHART 7.459 (H) 11/07/2018 1312   PCO2ART 30.5 (L) 11/07/2018 1312   PO2ART 46.0 (L) 11/07/2018 1312   HCO3 21.7 11/07/2018 1312   TCO2 23 11/07/2018 1312   ACIDBASEDEF 1.0 11/07/2018 1312   O2SAT 85.0 11/07/2018 1312    Condition -  Guarded  Family Communication  : Spoke with patient's daughter over the phone on 7/27  Code Status :  Full Code  Diet :  Diet Order            Diet regular Room service appropriate? Yes; Fluid consistency: Thin  Diet effective now                Disposition Plan  :  Remain inpatient  Consults  :  PCCM  Procedures  :   7/10>>7/22 left IJ CVL 7/24>>PICC 7/24>> lower extremity Doppler negative for DVT  DVT Prophylaxis  :  Lovenox -watch d-dimer   Lab Results  Component Value Date   PLT 126 (L) 11/25/2018    Inpatient Medications  Scheduled Meds:  sodium chloride   Intravenous Once   aspirin  81 mg Oral QHS   bisacodyl  10 mg Oral Once   brimonidine  1 drop Both Eyes BID   Chlorhexidine Gluconate Cloth  6 each Topical Daily   cilostazol  100 mg Oral BID   enoxaparin (LOVENOX) injection  60 mg Subcutaneous Q24H   famotidine  20 mg Oral QHS   folic acid  1 mg Oral Daily   gabapentin  600 mg Oral TID   insulin aspart  0-20 Units Subcutaneous TID WC   insulin aspart protamine- aspart  10 Units Subcutaneous Q supper   insulin aspart protamine- aspart  25 Units Subcutaneous Q breakfast   Ipratropium-Albuterol  2 puff Inhalation QID   latanoprost  1 drop Both Eyes QHS   mouth rinse  15 mL Mouth Rinse BID   oxybutynin  5 mg Oral BID   polyethylene glycol  17 g Oral Once   predniSONE  20 mg Oral Q breakfast   senna-docusate  2 tablet Oral BID   sodium chloride flush  10-40 mL Intracatheter Q12H   sodium chloride flush  3 mL Intravenous Q12H   tamsulosin  0.4 mg Oral Daily   thiamine  100 mg Oral Daily   timolol  1 drop Both Eyes BID   vitamin C  500 mg Oral Daily   zinc sulfate  220 mg Oral Daily   Continuous Infusions: PRN Meds:.acetaminophen, benzonatate, chlorpheniramine-HYDROcodone, guaiFENesin-dextromethorphan, ondansetron **OR** ondansetron (ZOFRAN) IV, senna-docusate, sodium chloride, sodium chloride flush  Antibiotics  :    Anti-infectives (From admission, onward)   Start  Dose/Rate Route Frequency Ordered Stop   11/08/18 1330  remdesivir 100 mg in sodium chloride 0.9 % 250 mL IVPB     100 mg 500 mL/hr over 30 Minutes Intravenous Every 24 hours 11/07/18 1229 11/11/18 1304    11/07/18 1330  remdesivir 200 mg in sodium chloride 0.9 % 250 mL IVPB     200 mg 500 mL/hr over 30 Minutes Intravenous Once 11/07/18 1229 11/08/18 1529       Time Spent in minutes  25   Oren Binet M.D on 11/25/2018 at 12:54 PM  To page go to [email or web address] - use universal password  [facility] -  Office  [phone]   See all Orders from today for further details   Admit date - 11/03/2018    20    Objective:   Vitals:   11/25/18 0500 11/25/18 0631 11/25/18 0843 11/25/18 1143  BP:   100/87 115/73  Pulse: 95  (!) 137 (!) 121  Resp:    20  Temp:   97.7 F (36.5 C) 98.2 F (36.8 C)  TempSrc:   Oral Oral  SpO2: 98%  91% 93%  Weight:  114 kg    Height:        Wt Readings from Last 3 Encounters:  11/25/18 114 kg  05/06/18 117.9 kg  04/28/18 122.8 kg     Intake/Output Summary (Last 24 hours) at 11/25/2018 1254 Last data filed at 11/25/2018 0830 Gross per 24 hour  Intake 720 ml  Output 1600 ml  Net -880 ml    Physical Exam Gen Exam:Alert awake-not in any distress HEENT:atraumatic, normocephalic Chest: B/L clear to auscultation anteriorly CVS:S1S2 regular Abdomen:soft non tender, non distended Extremities:no edema Neurology: Non focal Skin: no rash   Data Review:    CBC Recent Labs  Lab 11/21/18 0345 11/22/18 0042 11/23/18 0500 11/24/18 0615 11/25/18 0450  WBC 8.9 13.0* 8.1 8.2 7.4  HGB 16.2 15.8 14.6 14.6 14.0  HCT 49.9 46.3 43.5 43.8 40.2  PLT 160 178 145* 129* 126*  MCV 92.1 90.3 89.9 90.1 90.1  MCH 29.9 30.8 30.2 30.0 31.4  MCHC 32.5 34.1 33.6 33.3 34.8  RDW 14.1 14.0 13.8 14.1 14.3    Chemistries  Recent Labs  Lab 11/21/18 0345 11/21/18 1605 11/22/18 0042 11/23/18 0500 11/24/18 0615 11/25/18 0450  NA 131* 131* 133* 132* 135 131*  K 6.0* 4.9 4.3 4.7 4.3 4.4  CL 97* 92* 94* 95* 96* 94*  CO2 25 24 26 27 28 28   GLUCOSE 175* 348* 120* 231* 68* 273*  BUN 36* 39* 42* 36* 35* 35*  CREATININE 1.45* 1.61* 1.50* 1.24 1.38*  1.34*  CALCIUM 8.7* 9.1 9.4 9.0 8.7* 8.7*  AST 26  --  21 17 17 16   ALT 53*  --  45* 38 36 34  ALKPHOS 90  --  85 67 63 60  BILITOT 0.7  --  0.6 0.9 0.9 0.9   ------------------------------------------------------------------------------------------------------------------ No results for input(s): CHOL, HDL, LDLCALC, TRIG, CHOLHDL, LDLDIRECT in the last 72 hours.  Lab Results  Component Value Date   HGBA1C 7.3 (H) 11/08/2018   ------------------------------------------------------------------------------------------------------------------ No results for input(s): TSH, T4TOTAL, T3FREE, THYROIDAB in the last 72 hours.  Invalid input(s): FREET3 ------------------------------------------------------------------------------------------------------------------ Recent Labs    11/24/18 0615 11/25/18 0450  FERRITIN 300 270    Coagulation profile No results for input(s): INR, PROTIME in the last 168 hours.  Recent Labs    11/24/18 0615 11/25/18 0450  DDIMER 1.72* 1.32*    Cardiac  Enzymes No results for input(s): CKMB, TROPONINI, MYOGLOBIN in the last 168 hours.  Invalid input(s): CK ------------------------------------------------------------------------------------------------------------------    Component Value Date/Time   BNP 29.4 11/21/2018 0345    Micro Results No results found for this or any previous visit (from the past 240 hour(s)).  Radiology Reports Dg Chest Port 1 View  Result Date: 11/18/2018 CLINICAL DATA:  Hypoxia. EXAM: PORTABLE CHEST 1 VIEW COMPARISON:  11/13/2018. FINDINGS: Left IJ line noted stable position. Cardiomegaly. Diffuse bilateral severe pulmonary infiltrates/edema. No pleural effusion or pneumothorax. IMPRESSION: 1.  Left IJ line noted stable position. 2. Cardiomegaly. Diffuse bilateral severe pulmonary infiltrates/edema again noted. No interim change. Electronically Signed   By: [place]   On: 11/18/2018 16:16   Dg Chest Port 1  View  Result Date: 11/13/2018 CLINICAL DATA:  Acute respiratory failure with hypoxia. EXAM: PORTABLE CHEST 1 VIEW COMPARISON:  11/11/2018 FINDINGS: Left IJ central venous catheter unchanged with tip obliquely oriented over the junction of the brachiocephalic vein to SVC. Patient is rotated to the left. Lungs are hypoinflated demonstrate interval worsening of hazy central airspace opacification right worse than left. No definite effusion. Mild stable cardiomegaly. Remainder of the exam is unchanged. IMPRESSION: Interval worsening bilateral hazy central airspace opacification right worse than left likely asymmetric interstitial edema and less likely infection. Stable cardiomegaly.  Left IJ central venous catheter unchanged. Electronically Signed   By: Marin Olp M.D.   On: 11/13/2018 08:59   Dg Chest Port 1 View  Result Date: 11/11/2018 CLINICAL DATA:  Acute respiratory failure due to hypoxemia. EXAM: PORTABLE CHEST 1 VIEW COMPARISON:  Radiograph November 10, 2018. FINDINGS: Stable cardiomediastinal silhouette. Left internal jugular catheter is unchanged in position. No pneumothorax or significant pleural effusion is noted. Stable bilateral lung opacities are noted concerning for edema or pneumonia. Bony thorax is unremarkable. IMPRESSION: Stable bilateral lung opacities as described above. Electronically Signed   By: Marijo Conception M.D.   On: 11/11/2018 07:34   Dg Chest Port 1 View  Result Date: 11/10/2018 CLINICAL DATA:  Pneumatocele of lung EXAM: PORTABLE CHEST 1 VIEW COMPARISON:  Radiograph 11/10/2018 FINDINGS: Central venous line with tip in the distal brachiocephalic vein unchanged. Stable cardiac silhouette. Fine multifocal airspace disease is slightly increased in density compared to prior as noted over the RIGHT hemidiaphragm and LEFT upper lobe. Low lung volumes. IMPRESSION: Mild increase in multifocal fine airspace disease. Electronically Signed   By: Suzy Bouchard M.D.   On: 11/10/2018 10:32    Dg Chest Port 1 View  Result Date: 11/10/2018 CLINICAL DATA:  78 year old male with COVID-19 pneumonia EXAM: PORTABLE CHEST 1 VIEW COMPARISON:  Prior chest x-ray 11/07/2018 FINDINGS: Stable position of left IJ central venous catheter. The catheter tip overlies the innominate vein. Improving appearance of multifocal patchy interstitial and airspace opacities bilaterally. There is a region of focal clearing in the left mid lung just superior to the cardiac margin which could represent a developing pneumatocele. No acute osseous abnormality. IMPRESSION: 1. Improving appearance of bilateral multifocal interstitial and airspace opacities compared to 11/07/2018. 2. Region of more focal and slightly rounded lucency in the left mid lung could represent a developing pneumatocele versus a region of more advanced clearing. 3. Stable position of left IJ central venous catheter. Electronically Signed   By: Jacqulynn Cadet M.D.   On: 11/10/2018 08:01   Dg Chest Port 1 View  Result Date: 11/07/2018 CLINICAL DATA:  Central line placement, confirm positioning. EXAM: PORTABLE CHEST 1 VIEW COMPARISON:  Numerous chest radiographs, most recently November 07, 2018, CTA chest Sep 19, 2015. FINDINGS: Interval placement of a left internal jugular approach central venous catheter. Catheter tip terminates near the confluence of the brachiocephalic vein and superior vena cava, could be advanced to insure positioning within the lower SVC/cavoatrial junction. Widespread bilateral airspace opacities are again seen most pronounced in the right mid and lower lung. No pneumothorax or effusion is present. Cardiac silhouette remains enlarged. Cardiomediastinal contours are essentially unchanged from prior studies. No acute osseous or soft tissue abnormality. IMPRESSION: 1. Interval placement of a left internal jugular approach central venous catheter. Catheter tip terminates near the confluence of the brachiocephalic vein and superior vena  cava, could be advanced 2-3 cm to ensure positioning within the lower SVC/cavoatrial junction. 2. No procedural complication identified. 3. Unchanged diffuse bilateral airspace disease. Electronically Signed   By: MD Lovena Le   On: 11/07/2018 19:06   Dg Chest Port 1 View  Result Date: 11/07/2018 CLINICAL DATA:  Shortness of breath EXAM: PORTABLE CHEST 1 VIEW COMPARISON:  November 05, 2018 FINDINGS: The heart size and mediastinal contours are stable. The heart size is enlarged. Hazy opacities are identified in the right lung and to a lesser degree in the left lung, worse compared to prior exam. There is no pleural effusion. The visualized skeletal structures are unremarkable. IMPRESSION: Bilateral pulmonary infiltrates are slightly worse compared to prior exam of November 05, 2018. Electronically Signed   By: Abelardo Diesel M.D.   On: 11/07/2018 12:10   Dg Chest Port 1 View  Result Date: 11/05/2018 CLINICAL DATA:  Acute respiratory disease secondary to COVID-19. EXAM: PORTABLE CHEST 1 VIEW 5:11 a.m. COMPARISON:  11/03/2018 and 03/25/2016 FINDINGS: Faint bilateral pulmonary infiltrates appear slightly improved at the bases. Heart size and pulmonary vascularity are normal. No effusions. No bone abnormality. IMPRESSION: Slight improvement in faint bilateral pulmonary infiltrates. Electronically Signed   By: Lorriane Shire M.D.   On: 11/05/2018 05:50   Dg Chest Port 1 View  Result Date: 11/03/2018 CLINICAL DATA:  Cough.  COVID-19. EXAM: PORTABLE CHEST 1 VIEW COMPARISON:  03/25/2016. FINDINGS: Mediastinum hilar structures normal. Cardiomegaly with mild pulmonary venous congestion and bilateral interstitial prominence. CHF could present this fashion. Pneumonitis particularly in light of the patient's history of COVID-19 infection could also present this fashion. Small left pleural effusion cannot be excluded. IMPRESSION: 1. Cardiomegaly with mild pulmonary venous congestion and bilateral interstitial prominence. CHF  could present in this fashion. Pneumonitis particularly in light of the patient's history of COVID-19 infection could also present this fashion. 2.  Small left pleural effusion cannot be excluded. Electronically Signed   By: Marcello Moores  Register   On: 11/03/2018 14:31   Dg Chest Port 1v Same Day  Result Date: 11/19/2018 CLINICAL DATA:  Pneumonia in a patient who is COVID-19 positive. EXAM: PORTABLE CHEST 1 VIEW COMPARISON:  Single-view of the chest 11/18/2018 and 11/13/2018. FINDINGS: Left IJ catheter is unchanged. Extensive bilateral airspace disease persists without change given differences in technique. Heart size is normal. No pneumothorax or pleural effusion. IMPRESSION: No notable change in extensive bilateral airspace disease. Electronically Signed   By: Inge Rise M.D.   On: 11/19/2018 11:51   Vas [place] Lower Extremity Venous (dvt)  Result Date: 11/21/2018  Lower Venous Study Indications: Swelling, and Edema. Other Indications: Elevated D Dimer. Risk Factors: COVID 19 and obesity. Performing Technologist: Darlina Sicilian RDCS  Examination Guidelines: A complete evaluation includes B-mode imaging, spectral Doppler, color Doppler, and power Doppler  as needed of all accessible portions of each vessel. Bilateral testing is considered an integral part of a complete examination. Limited examinations for reoccurring indications may be performed as noted.  +---------+---------------+---------+-----------+----------+-------------------+  RIGHT     Compressibility Phasicity Spontaneity Properties Summary              +---------+---------------+---------+-----------+----------+-------------------+  CFV       Full            Yes       Yes                                         +---------+---------------+---------+-----------+----------+-------------------+  SFJ       Full                                                                   +---------+---------------+---------+-----------+----------+-------------------+  FV Prox   Full                                                                  +---------+---------------+---------+-----------+----------+-------------------+  FV Mid    Full                                                                  +---------+---------------+---------+-----------+----------+-------------------+  FV Distal Full                                                                  +---------+---------------+---------+-----------+----------+-------------------+  POP       Full            Yes       Yes                                         +---------+---------------+---------+-----------+----------+-------------------+  PTV       Full                                                                  +---------+---------------+---------+-----------+----------+-------------------+  PERO  Not visualized well  +---------+---------------+---------+-----------+----------+-------------------+   +---------+---------------+---------+-----------+----------+-------+  LEFT      Compressibility Phasicity Spontaneity Properties Summary  +---------+---------------+---------+-----------+----------+-------+  CFV       Full            Yes       Yes                             +---------+---------------+---------+-----------+----------+-------+  SFJ       Full                                                      +---------+---------------+---------+-----------+----------+-------+  FV Prox   Full                                                      +---------+---------------+---------+-----------+----------+-------+  FV Mid    Full                                                      +---------+---------------+---------+-----------+----------+-------+  FV Distal Full                                                       +---------+---------------+---------+-----------+----------+-------+  POP       Full            Yes       Yes                             +---------+---------------+---------+-----------+----------+-------+  PTV       Full                                                      +---------+---------------+---------+-----------+----------+-------+  PERO      Full                                                      +---------+---------------+---------+-----------+----------+-------+     Summary: Right: There is no evidence of deep vein thrombosis in the lower extremity. No cystic structure found in the popliteal fossa. Left: There is no evidence of deep vein thrombosis in the lower extremity. No cystic structure found in the popliteal fossa.  *See table(s) above for measurements and observations. Electronically signed by Deitra Mayo MD on 11/21/2018 at 2:49:58 PM.    Final    [place] Ekg Site Rite  Result Date: 11/20/2018 If Site Rite image not attached, placement could not be confirmed due to current cardiac  rhythm.  [place] Ekg Site Rite  Result Date: 11/19/2018 If [facility] image not attached, placement could not be confirmed due to current cardiac rhythm.

## 2018-11-26 LAB — COMPREHENSIVE METABOLIC PANEL
ALT: 31 U/L (ref 0–44)
AST: 16 U/L (ref 15–41)
Albumin: 3.1 g/dL — ABNORMAL LOW (ref 3.5–5.0)
Alkaline Phosphatase: 62 U/L (ref 38–126)
Anion gap: 8 (ref 5–15)
BUN: 35 mg/dL — ABNORMAL HIGH (ref 8–23)
CO2: 29 mmol/L (ref 22–32)
Calcium: 8.9 mg/dL (ref 8.9–10.3)
Chloride: 95 mmol/L — ABNORMAL LOW (ref 98–111)
Creatinine, Ser: 1.39 mg/dL — ABNORMAL HIGH (ref 0.61–1.24)
GFR calc Af Amer: 57 mL/min — ABNORMAL LOW (ref >60)
GFR calc non Af Amer: 49 mL/min — ABNORMAL LOW (ref >60)
Glucose, Bld: 297 mg/dL — ABNORMAL HIGH (ref 70–99)
Potassium: 4.7 mmol/L (ref 3.5–5.1)
Sodium: 132 mmol/L — ABNORMAL LOW (ref 135–145)
Total Bilirubin: 0.8 mg/dL (ref 0.3–1.2)
Total Protein: 5.3 g/dL — ABNORMAL LOW (ref 6.5–8.1)

## 2018-11-26 LAB — CBC
HCT: 38 % — ABNORMAL LOW (ref 39.0–52.0)
Hemoglobin: 13.2 g/dL (ref 13.0–17.0)
MCH: 31.4 pg (ref 26.0–34.0)
MCHC: 34.7 g/dL (ref 30.0–36.0)
MCV: 90.3 fL (ref 80.0–100.0)
Platelets: 118 10*3/uL — ABNORMAL LOW (ref 150–400)
RBC: 4.21 MIL/uL — ABNORMAL LOW (ref 4.22–5.81)
RDW: 14.2 % (ref 11.5–15.5)
WBC: 6.3 10*3/uL (ref 4.0–10.5)
nRBC: 0 % (ref 0.0–0.2)

## 2018-11-26 LAB — D-DIMER, QUANTITATIVE: D-Dimer, Quant: 1.21 ug/mL-FEU — ABNORMAL HIGH (ref 0.00–0.50)

## 2018-11-26 LAB — NOVEL CORONAVIRUS, NAA (HOSP ORDER, SEND-OUT TO REF LAB; TAT 18-24 HRS): SARS-CoV-2, NAA: NOT DETECTED

## 2018-11-26 LAB — GLUCOSE, CAPILLARY
Glucose-Capillary: 246 mg/dL — ABNORMAL HIGH (ref 70–99)
Glucose-Capillary: 359 mg/dL — ABNORMAL HIGH (ref 70–99)
Glucose-Capillary: 326 mg/dL — ABNORMAL HIGH (ref 70–99)
Glucose-Capillary: 225 mg/dL — ABNORMAL HIGH (ref 70–99)

## 2018-11-26 MED ORDER — FUROSEMIDE 10 MG/ML IJ SOLN
60.0000 mg | Freq: Once | INTRAMUSCULAR | Status: AC
  Administered 2018-11-26: 60 mg via INTRAVENOUS
  Filled 2018-11-26: qty 6

## 2018-11-26 MED ORDER — OXYMETAZOLINE HCL 0.05 % NA SOLN
1.0000 | Freq: Two times a day (BID) | NASAL | Status: AC
  Administered 2018-11-26 – 2018-11-27 (×4): 1 via NASAL
  Filled 2018-11-26: qty 15

## 2018-11-26 MED ORDER — PANTOPRAZOLE SODIUM 40 MG PO TBEC
40.0000 mg | DELAYED_RELEASE_TABLET | Freq: Every day | ORAL | Status: DC
  Administered 2018-11-26 – 2018-12-01 (×6): 40 mg via ORAL
  Filled 2018-11-26 (×6): qty 1

## 2018-11-26 MED ORDER — PREDNISONE 10 MG PO TABS
10.0000 mg | ORAL_TABLET | Freq: Every day | ORAL | Status: AC
  Administered 2018-11-27 – 2018-11-29 (×3): 10 mg via ORAL
  Filled 2018-11-26 (×3): qty 1

## 2018-11-26 MED ORDER — ALUM & MAG HYDROXIDE-SIMETH 200-200-20 MG/5ML PO SUSP
30.0000 mL | Freq: Four times a day (QID) | ORAL | Status: DC | PRN
  Administered 2018-11-26 – 2018-11-30 (×2): 30 mL via ORAL
  Filled 2018-11-26 (×2): qty 30

## 2018-11-26 MED ORDER — METOPROLOL TARTRATE 25 MG PO TABS
25.0000 mg | ORAL_TABLET | Freq: Two times a day (BID) | ORAL | Status: DC
  Administered 2018-11-26 – 2018-12-01 (×10): 25 mg via ORAL
  Filled 2018-11-26 (×11): qty 1

## 2018-11-26 NOTE — Progress Notes (Signed)
Inpatient Rehabilitation Admissions Coordinator  Noted COVID 7/26 came back today negative. He is requiring up to 6 to 8 liters O2 with activity yesterday with therapy . I have contacted Dr. Sloan Leiter to place second COVID testing needed for CDC protocol for Post COVID. Pt not yet at a level to begin to pursue [facility] approval for CIR admit. I will continue to follow.  Danne Baxter, RN, MSN Rehab Admissions Coordinator [phone] 11/26/2018 11:25 AM

## 2018-11-26 NOTE — Progress Notes (Signed)
Occupational Therapy Treatment Patient Details Name: Andrew Larsen MRN: [id number] DOB: 06-10-1940 Today's Date: 11/26/2018    History of present illness Pt is a 78 y.o. male admitted 11/03/18 with SOB and cough; found to have COVID-19. Hospital course complicated by worsening hypoxemia requirng transfer to ICU. 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   OT comments  Pt continues to present with high motivation to participate in therapy. At rest, pt SpO2 >95% on 4L O2. Pt performing bed mobility with Min Guard A and then functional mobility with Min-Mod A +2 and RW. SpO2 dropping to 77% on 6L. Pt recovering to 80s with seated rest break and 6L O2 (use of finger probe and nelcor for monitoring). Continue to recommend dc to CIR as pt is highly motivated, making progress, and has good family support. Will continue to follow acutely as admitted.    Follow Up Recommendations  CIR;Supervision/Assistance - 24 hour    Equipment Recommendations  3 in 1 bedside commode;Tub/shower seat    Recommendations for Other Services PT consult;Rehab consult    Precautions / Restrictions Precautions Precautions: Fall Precaution Comments: Both legs are weak from unknown itology myopathy, must monitor VS, trying to wean to 6 L [place] Restrictions Weight Bearing Restrictions: No       Mobility Bed Mobility Overal bed mobility: Needs Assistance Bed Mobility: Rolling;Sidelying to Sit Rolling: Min guard Sidelying to sit: Min guard Supine to sit: Min assist;Min guard;HOB elevated     General bed mobility comments: No physical A needed. extra time for rest between transitions, use of bed rail  Transfers Overall transfer level: Needs assistance Equipment used: Rolling walker (2 wheeled) Transfers: Sit to/from Stand Sit to Stand: Min guard;From elevated surface;+2 safety/equipment;Mod  assist         General transfer comment: MIn guard from higher surface, mod from recliner(low) to power up and transition hands to RW, cues to reach to recliner but does not and noted decreased descent quickly to recliner.    Balance Overall balance assessment: History of Falls;Needs assistance Sitting-balance support: Feet supported Sitting balance-Leahy Scale: Good Sitting balance - Comments: EOB and in recliner   Standing balance support: Bilateral upper extremity supported;During functional activity Standing balance-Leahy Scale: Poor Standing balance comment: Reliant on UE support                           ADL either performed or assessed with clinical judgement   ADL Overall ADL's : Needs assistance/impaired                         Toilet Transfer: +2 for safety/equipment;Min guard;Moderate assistance;+2 for physical assistance;RW(Simulated to recliner) Toilet Transfer Details (indicate cue type and reason): Min Guard A for elevated surface at EOB. Pt taking steps forward and then bringing reclienr behind him. Requiring Mod A for power up from lower surface into standing.         Functional mobility during ADLs: Min guard;Rolling walker;Cueing for safety;Minimal assistance(a couple steps forward) General ADL Comments: Pt highly motivated to participate in therapy. Pt taking 4 steps forward and then required seated rest break at recliner.      Vision       Perception     Praxis      Cognition Arousal/Alertness: Awake/alert Behavior During Therapy: WFL for tasks assessed/performed Overall Cognitive  Status: Within Functional Limits for tasks assessed                                 General Comments: Pt making jokes this session. Highly motivated to participate in therapy        Exercises     Shoulder Instructions       General Comments SpO2 98-95% on 4L at rest. Pt dropping to 77% during activity and required seated rest  break to recover back to 80s on 6L O2.     Pertinent Vitals/ Pain       Pain Assessment: Faces Faces Pain Scale: Hurts whole lot Pain Location: bed sore on buttock Pain Descriptors / Indicators: Discomfort;Grimacing Pain Intervention(s): Monitored during session;Limited activity within patient's tolerance;Repositioned  Home Living                                          Prior Functioning/Environment              Frequency  Min 3X/week        Progress Toward Goals  OT Goals(current goals can now be found in the care plan section)  Progress towards OT goals: Progressing toward goals  Acute Rehab OT Goals Patient Stated Goal: Return home to family OT Goal Formulation: With patient Time For Goal Achievement: 12/02/18 Potential to Achieve Goals: Good ADL Goals Pt Will Perform Grooming: with min guard assist;standing Pt Will Perform Lower Body Dressing: with min guard assist;with adaptive equipment;sit to/from stand Pt Will Transfer to Toilet: with min guard assist;bedside commode;ambulating Pt Will Perform Toileting - Clothing Manipulation and hygiene: with min guard assist;sit to/from stand;sitting/lateral leans Pt Will Perform Tub/Shower Transfer: with min assist;shower seat;ambulating;Tub transfer  Plan Discharge plan remains appropriate    Co-evaluation    PT/OT/SLP Co-Evaluation/Treatment: Yes Reason for Co-Treatment: Complexity of the patient's impairments (multi-system involvement);To address functional/ADL transfers;For patient/therapist safety PT goals addressed during session: Mobility/safety with mobility OT goals addressed during session: ADL's and self-care      AM-PAC OT "6 Clicks" Daily Activity     Outcome Measure   Help from another person eating meals?: None Help from another person taking care of personal grooming?: A Little Help from another person toileting, which includes using toliet, bedpan, or urinal?: A Lot Help from  another person bathing (including washing, rinsing, drying)?: A Lot Help from another person to put on and taking off regular upper body clothing?: A Little Help from another person to put on and taking off regular lower body clothing?: A Lot 6 Click Score: 16    End of Session Equipment Utilized During Treatment: Oxygen;Rolling walker;Gait belt(6L)  OT Visit Diagnosis: Unsteadiness on feet (R26.81);Other abnormalities of gait and mobility (R26.89);Muscle weakness (generalized) (M62.81)   Activity Tolerance Patient tolerated treatment well   Patient Left in chair;with call bell/phone within reach   Nurse Communication Mobility status        Time: 7253-6644 OT Time Calculation (min): 32 min  Charges: OT General Charges $OT Visit: 1 Visit OT Treatments $Therapeutic Activity: 8-22 mins  [place], OTR/L Acute Rehab Pager: [phone] Office: [place] 11/26/2018, 1:38 PM

## 2018-11-26 NOTE — Progress Notes (Signed)
Physical Therapy Treatment Patient Details Name: Andrew Larsen MRN: [id number] DOB: 1940/12/31 Today's Date: 11/26/2018    History of Present Illness Pt is a 78 y.o. male admitted 11/03/18 with SOB and cough; found to have COVID-19. Hospital course complicated by worsening hypoxemia requirng transfer to ICU. 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    PT Comments    The patient is weaning down on oxygen. Today at rest on 4 L  [place] 95%(finger probe)   Placed forehead nelcor for mobility. Patient dropped to 77%  On 6 L [place].Patient recovered within 2 minutes back to low 80's. Gradual increase back to 88% on 4 L.( On finger probe.)Patient has HFNC in place.continue to recommend strongly for CIR. PTA patient was modified independent in the ome and was to start OPPT before hospitalization.                                                                                      )        Follow Up Recommendations  CIR;Supervision for mobility/OOB     Equipment Recommendations  None recommended by PT    Recommendations for Other Services Rehab consult     Precautions / Restrictions Precautions Precaution Comments: Both legs are weak from unknown itology myopathy, must monitor VS, trying to wean to 6 L [place] Restrictions Weight Bearing Restrictions: No    Mobility  Bed Mobility   Bed Mobility: Supine to Sit     Supine to sit: Min assist;Min guard;HOB elevated     General bed mobility comments: extra time for rest between transitions, use of bed rail  Transfers Overall transfer level: Needs assistance Equipment used: Rolling walker (2 wheeled) Transfers: Sit to/from Stand Sit to Stand: Min guard;From elevated surface;Mod assist;+2 safety/equipment         General transfer comment: MIn guard from higher surface, mod from recliner(low) to power up and transition hands  to RW, cues to reach to recliner but does not and noted decreased descent quickly to recliner.  Ambulation/Gait Ambulation/Gait assistance: Min assist Gait Distance (Feet): 8 Feet Assistive device: Rolling walker (2 wheeled) Gait Pattern/deviations: Step-to pattern;Trunk flexed Gait velocity: Decreased   General Gait Details: patient reports that he feels that his legs are weak and afraid to buckle.Patient knees guarded for safety. no notice of knees buckling.   Stairs             Wheelchair Mobility    Modified Rankin (Stroke Patients Only)       Balance Overall balance assessment: History of Falls;Needs assistance Sitting-balance support: Feet supported Sitting balance-Leahy Scale: Good Sitting balance - Comments: EOB and in recliner   Standing balance support: Bilateral upper extremity supported;During functional activity Standing balance-Leahy Scale: Poor Standing balance comment: Reliant on UE support                            Cognition   Behavior During Therapy: WFL for tasks assessed/performed  Exercises      General Comments        Pertinent Vitals/Pain Faces Pain Scale: Hurts whole lot Pain Location: bed sore on buttock Pain Descriptors / Indicators: Discomfort;Grimacing Pain Intervention(s): Repositioned    Home Living                      Prior Function            PT Goals (current goals can now be found in the care plan section) Progress towards PT goals: Progressing toward goals    Frequency    Min 3X/week      PT Plan Current plan remains appropriate    Co-evaluation PT/OT/SLP Co-Evaluation/Treatment: Yes Reason for Co-Treatment: Complexity of the patient's impairments (multi-system involvement);For patient/therapist safety PT goals addressed during session: Mobility/safety with mobility OT goals addressed during session: ADL's and self-care       AM-PAC PT "6 Clicks" Mobility   Outcome Measure  Help needed turning from your back to your side while in a flat bed without using bedrails?: None Help needed moving from lying on your back to sitting on the side of a flat bed without using bedrails?: A Little Help needed moving to and from a bed to a chair (including a wheelchair)?: A Little Help needed standing up from a chair using your arms (e.g., wheelchair or bedside chair)?: A Lot Help needed to walk in hospital room?: A Lot Help needed climbing 3-5 steps with a railing? : Total 6 Click Score: 15    End of Session Equipment Utilized During Treatment: Oxygen Activity Tolerance: Patient tolerated treatment well Patient left: in chair;with call bell/phone within reach Nurse Communication: Mobility status PT Visit Diagnosis: Unsteadiness on feet (R26.81);Muscle weakness (generalized) (M62.81);Repeated falls (R29.6)     Time: 3154-0086 PT Time Calculation (min) (ACUTE ONLY): 33 min  Charges:  $Gait Training: 8-22 mins                     [place]  Office [phone]    Claretha Cooper 11/26/2018, 1:28 PM

## 2018-11-26 NOTE — Progress Notes (Addendum)
Inpatient Rehabilitation Admissions Coordinator  I contacted pt's daughter, [name] by phone. We discussed goals an expectations of an inpt rehab admission . She prefers inpt rehab rather than SNF. She works for [employer] remotely at home as Emergency planning/management officer. She can provide 24/7 assist at home. She also discussed his new diagnosis of Inclusion body Myositis which pt is unaware of at this time. I will follow his progress with therapy to begin authorization with [facility] Medicare once second Covid negative is received/ordered today per my request. I will follow. Great CIR candidate.  Danne Baxter, RN, MSN Rehab Admissions Coordinator [phone] 11/26/2018 1:21 PM

## 2018-11-26 NOTE — PMR Pre-admission (Signed)
PMR Admission Coordinator Pre-Admission Assessment  Patient: Andrew Larsen is an 78 y.o., male MRN: [id number] DOB: [birth year] Height: _0  (180.3 cm) Weight: 114.5 kg  Insurance Information HMO: yes    PPO:      PCP:      IPA:      80/20:      OTHER: Medicare advantage plan PRIMARY: [employer]       Policy#: [id number]      Subscriber: pt CM Name: Monica Martinez      Phone#: [phone]    Fax#: [phone] Pre-Cert#: [id number] [place] provided by Enis Gash with updates due to her on 8/10 at fax listed above      Employer: n/a Benefits:  Phone #: [phone]     Name:  Eff. Date: 04/30/2018     Deduct: $0      Out of Pocket Max: $3400 (met [phone])      Life Max: n/a CIR: $295/day for days 1-6      SNF: 20 full days Outpatient:      Co-Pay: $10-$40/visit Home Health: 100%      Co-Pay:  DME: 80%     Co-Pay: 20% Providers:  SECONDARY: none      Medicaid Application Date:       Case Manager:  Disability Application Date:       Case Worker:   The Data Collection Information Summary for patients in Inpatient Rehabilitation Facilities with attached Privacy Act [place] Records was provided and verbally reviewed with: Family  Emergency Contact Information Contact Information    Name Relation Home Work [place] Daughter [phone]  [phone]      Current Medical History  Patient Admitting Diagnosis: Debility  Post COVID  History of Present Illness: 78 year old male with medical history of PAD, DM, HTN, BPH, OSA with presents on 7/06 due to SOB and non productive cough. He has had family contact with family members tested positive for COVID. Diagnosed with acute hypoxic respiratory failure in the setting of COVID 19 Pneumonia. Hospital course complicated by worsening hypoxemia necessitating ICU stay. Treated with Actemra, convalescent plasma, steroids and Remdesivir.   Slow clinical improvement with Oxygen requirements anywhere from 5 to 8 liters to  maintain O2 sats. No evidence of volume overload, but given IV Lasix periodically to maintain negative balance. Encourage supportive measures and use of incentive spirometer. Recent diagnosis of inclusion body Myositis by [facility] Dr. Warnell Forester pta. Slow taper of steroids recommended.   Mild epistaxis on 7/22 and 7/28 treated with nasal saline spray and to recive Afrin for 2 days.   CHF clinically compensate with periodic IV lasix to maintain negative balance.HTN currently controlled , not on any antihypertensives . BPH continue Flomax. OSA unable to use CPAP, continue on high flow oxygen at night for now.  Recent dx of inclusion body myositis to f/u with Dr. Warnell Forester at [facility] [phone]) on steroid taper.  Stage 2 sacral decubitus per WOC treatment.   COVID negative 7/26 and COVID 7/29.    Patient's medical record from [facility] has been reviewed by the rehabilitation admission coordinator and physician.  Past Medical History  Past Medical History:  Diagnosis Date   CKD (chronic kidney disease)    CKD stage III (01/2018)   Daytime somnolence    Diabetes mellitus without complication (HCC)    Fracture    right fibula/wears boot cast   GERD (gastroesophageal reflux disease)    Glaucoma    Hypercholesteremia  Hyperlipidemia    Hypertension    Mold exposure    PAD (peripheral artery disease) (HCC)    Sleep apnea     Family History   family history includes Cancer in his brother; Diabetes in his brother; Heart disease in his father; Prostate cancer in his brother.  Prior Rehab/Hospitalizations Has the patient had prior rehab or hospitalizations prior to admission? Yes  Has the patient had major surgery during 100 days prior to admission? No   Current Medications  Current Facility-Administered Medications:    acetaminophen (TYLENOL) tablet 650 mg, 650 mg, Oral, Q6H PRN, Dessa Phi, DO, 650 mg at 11/27/18 2123   alum & mag hydroxide-simeth (MAALOX/MYLANTA)  200-200-20 MG/5ML suspension 30 mL, 30 mL, Oral, Q6H PRN, Ghimire, Henreitta Leber, MD, 30 mL at 11/30/18 0908   aspirin chewable tablet 81 mg, 81 mg, Oral, QHS, Dessa Phi, DO, 81 mg at 11/30/18 2239   benzonatate (TESSALON) capsule 200 mg, 200 mg, Oral, TID PRN, Etta Quill, DO, 200 mg at 11/21/18 1123   brimonidine (ALPHAGAN) 0.15 % ophthalmic solution 1 drop, 1 drop, Both Eyes, BID, Allie Bossier, MD, 1 drop at 12/01/18 1017   chlorpheniramine-HYDROcodone (TUSSIONEX) 10-8 MG/5ML suspension 5 mL, 5 mL, Oral, Q12H PRN, Etta Quill, DO, 5 mL at 11/28/18 2218   cilostazol (PLETAL) tablet 100 mg, 100 mg, Oral, BID, Dessa Phi, DO, 100 mg at 12/01/18 1019   enoxaparin (LOVENOX) injection 60 mg, 60 mg, Subcutaneous, Q24H, Franky Macho, RPH, 60 mg at 12/01/18 1017   famotidine (PEPCID) tablet 20 mg, 20 mg, Oral, QHS, Dessa Phi, DO, 20 mg at 33/35/45 6256   folic acid (FOLVITE) tablet 1 mg, 1 mg, Oral, Daily, Allie Bossier, MD, 1 mg at 12/01/18 1016   furosemide (LASIX) tablet 40 mg, 40 mg, Oral, Daily, Cherene Altes, MD, 40 mg at 12/01/18 1016   gabapentin (NEURONTIN) capsule 600 mg, 600 mg, Oral, TID, Dessa Phi, DO, 600 mg at 12/01/18 1017   guaiFENesin-dextromethorphan (ROBITUSSIN DM) 100-10 MG/5ML syrup 5 mL, 5 mL, Oral, Q4H PRN, Cherene Altes, MD, 5 mL at 11/30/18 2253   insulin aspart (novoLOG) injection 0-20 Units, 0-20 Units, Subcutaneous, TID WC, Elgergawy, Silver Huguenin, MD, 4 Units at 12/01/18 3893   insulin aspart protamine- aspart (NOVOLOG MIX 70/30) injection 18 Units, 18 Units, Subcutaneous, Q supper, Cherene Altes, MD, 18 Units at 11/30/18 1705   insulin aspart protamine- aspart (NOVOLOG MIX 70/30) injection 34 Units, 34 Units, Subcutaneous, Q breakfast, Cherene Altes, MD, 34 Units at 12/01/18 7342   Ipratropium-Albuterol (COMBIVENT) respimat 2 puff, 2 puff, Inhalation, QID, Bonnielee Haff, MD, 2 puff at 12/01/18 0839    latanoprost (XALATAN) 0.005 % ophthalmic solution 1 drop, 1 drop, Both Eyes, QHS, Dessa Phi, DO, 1 drop at 11/30/18 2245   metoprolol tartrate (LOPRESSOR) tablet 25 mg, 25 mg, Oral, BID, Ghimire, Henreitta Leber, MD, 25 mg at 12/01/18 1016   ondansetron (ZOFRAN) tablet 4 mg, 4 mg, Oral, Q6H PRN **OR** ondansetron (ZOFRAN) injection 4 mg, 4 mg, Intravenous, Q6H PRN, Dessa Phi, DO   oxybutynin (DITROPAN) tablet 5 mg, 5 mg, Oral, BID, Dessa Phi, DO, 5 mg at 12/01/18 1016   pantoprazole (PROTONIX) EC tablet 40 mg, 40 mg, Oral, Daily, Ghimire, Shanker M, MD, 40 mg at 12/01/18 1016   protein supplement (ENSURE MAX) liquid, 11 oz, Oral, BID, Cherene Altes, MD, 11 oz at 11/30/18 2013   senna-docusate (Senokot-S) tablet 1 tablet, 1 tablet, Oral,  QHS PRN, Dessa Phi, DO, 1 tablet at 11/25/18 2111   senna-docusate (Senokot-S) tablet 2 tablet, 2 tablet, Oral, BID, Elgerg[place]y, Silver Huguenin, MD, 2 tablet at 12/01/18 1017   sodium chloride (OCEAN) 0.65 % nasal spray 1 spray, 1 spray, Each Nare, PRN, Elgergawy, Silver Huguenin, MD   tamsulosin (FLOMAX) capsule 0.4 mg, 0.4 mg, Oral, Daily, Dessa Phi, DO, 0.4 mg at 12/01/18 1017   thiamine (VITAMIN B-1) tablet 100 mg, 100 mg, Oral, Daily, Allie Bossier, MD, 100 mg at 12/01/18 1016   [DISCONTINUED] brimonidine (ALPHAGAN) 0.2 % ophthalmic solution 1 drop, 1 drop, Both Eyes, BID, 1 drop at 11/07/18 0824 **AND** timolol (TIMOPTIC) 0.5 % ophthalmic solution 1 drop, 1 drop, Both Eyes, BID, Joette Catching T, MD, 1 drop at 12/01/18 1017   vitamin C (ASCORBIC ACID) tablet 500 mg, 500 mg, Oral, Daily, Allie Bossier, MD, 500 mg at 12/01/18 1016   zinc sulfate capsule 220 mg, 220 mg, Oral, Daily, Allie Bossier, MD, 220 mg at 12/01/18 1016  Patients Current Diet:  Diet Order            Diet regular Room service appropriate? Yes; Fluid consistency: Thin  Diet effective now              Precautions / Restrictions Precautions Precautions:  Fall Precaution Comments: R > L leg weakness.right Hyperextends with stance, must monitor VS, trying to wean to </=4 [place] Restrictions Weight Bearing Restrictions: No   Has the patient had 2 or more falls or a fall with injury in the past year? Yes Frequent falls over past year progressively worse. Has local Neurologist as well as recent [facility] Neurologist for dx  Prior Activity Level Limited Community (1-2x/wk): does not drive; I with adls. Would inconsistent use RW and CANE. Progressive lower Extremity weakness with multiple falls over past year. Furniture walked and used wall to hold onto. New diagnosis of Inclusion Body Myositis [facility] Dr. Warnell Forester, pt unaware of final dx  Prior Functional Level Self Care: Did the patient need help bathing, dressing, using the toilet or eating? Independent  Indoor Mobility: Did the patient need assistance with walking from room to room (with or without device)? Independent  Stairs: Did the patient need assistance with internal or external stairs (with or without device)? Needed some help  Functional Cognition: Did the patient need help planning regular tasks such as shopping or remembering to take medications? Independent  Home Assistive Devices / Equipment Home Assistive Devices/Equipment: Eyeglasses, CPAP, Cane (specify quad or straight) Home Equipment: Walker - 2 wheels, Cane - single point  Prior Device Use: Indicate devices/aids used by the patient prior to current illness, exacerbation or injury? Walker and cane occasionally  Current Functional Level Cognition  Overall Cognitive Status: Within Functional Limits for tasks assessed Orientation Level: Oriented X4 General Comments: Continues to be motivated to participate in therapy    Extremity Assessment (includes Sensation/Coordination)  Upper Extremity Assessment: Generalized weakness  Lower Extremity Assessment: Defer to PT evaluation RLE Deficits / Details: hip flexion 2+/5, knee ext 2/5, knee  flexion 3/5, dorsiflex 3//5 RLE Sensation: history of peripheral neuropathy LLE Deficits / Details: general weakness, at least 4/5 LLE Sensation: history of peripheral neuropathy    ADLs  Overall ADL's : Needs assistance/impaired Eating/Feeding: Independent, Sitting Eating/Feeding Details (indicate cue type and reason): switched to HFNC from NRB end of session to eat lunch Grooming: Wash/dry hands, Set up, Sitting Grooming Details (indicate cue type and reason): Providing pt with hand sanitizer  Upper Body Bathing: Moderate assistance, Sitting Upper Body Bathing Details (indicate cue type and reason): assist for back Lower Body Bathing: Moderate assistance, Sit to/from stand Lower Body Bathing Details (indicate cue type and reason): Pt able to wash legs and anterior peri area while seated with lateral cleaning. Mod A for cleaning posterior peri area Upper Body Dressing : Min guard, Sitting Lower Body Dressing: Maximal assistance Lower Body Dressing Details (indicate cue type and reason): Pt bending forward to adjust socks demonstrating increased activity tolerance. Toilet Transfer: Min guard, RW, Ambulation, Minimal assistance(Simulated to recliner) Armed forces technical officer Details (indicate cue type and reason): Pt performing mobility to recliner with Min Guard A and RW.  Toileting- Clothing Manipulation and Hygiene: Supervision/safety, Set up, Sitting/lateral lean Toileting - Clothing Manipulation Details (indicate cue type and reason): Pt performing peri care while sitting on BSC and laterally leaning.  Functional mobility during ADLs: Min guard, Rolling walker, Cueing for safety, Minimal assistance(Short distance in room) General ADL Comments: Pt performing fucntional mobility to recliner for breakfast. Required Min Guard-Min A for safety and balance in mobility. SpO2 dropping to 78% on 4L (monitor on finger). Pt able to recover to 90s with seated rest break (~2 min) and purse lip breathing.      Mobility  Overal bed mobility: Needs Assistance Bed Mobility: Supine to Sit Rolling: Min guard Sidelying to sit: Min guard Supine to sit: Min guard, HOB elevated Sit to supine: Supervision Sit to sidelying: Mod assist General bed mobility comments: Min Guard A for safety. No physical A needed    Transfers  Overall transfer level: Needs assistance Equipment used: Rolling walker (2 wheeled) Transfers: Sit to/from Stand, [employer] Transfers Sit to Stand: Min guard Stand pivot transfers: Min guard General transfer comment: Min Guard A for safety with used of RW. Pt with LOB and sitting back down on EOB. With second attempt, pt able to gain balance in standing.     Ambulation / Gait / Stairs / Wheelchair Mobility  Ambulation/Gait Ambulation/Gait assistance: Min assist, +2 safety/equipment Gait Distance (Feet): 10 Feet Assistive device: Rolling walker (2 wheeled) Gait Pattern/deviations: Step-to pattern, Decreased step length - left, Decreased step length - right General Gait Details: Right knee hyperextends during stand, Ankle tends to invert. slight foot drop right foot. PT guarded the right knee during stance to guard against bucckling Gait velocity: Decreased Gait velocity interpretation: <1.31 ft/sec, indicative of household ambulator    Posture / Balance Dynamic Sitting Balance Sitting balance - Comments: EOB and in recliner Balance Overall balance assessment: History of Falls, Needs assistance Sitting-balance support: Feet supported Sitting balance-Leahy Scale: Good Sitting balance - Comments: EOB and in recliner Standing balance support: Bilateral upper extremity supported, During functional activity Standing balance-Leahy Scale: Poor Standing balance comment: Reliant on UE support    Special needs/care consideration BiPAP/CPAP used CPAP pta CPM  N/a Continuous Drip IV no Dialysis n/a Life Vest  N/a Oxygen on 4L via nasal cannula at rest, increasing needs with  exertion Special Bed  N/a Trach Size  N/a Wound Vac n/a Skin   McNichol, Rudi Heap, RN  Registered Nurse  WOC  Consult Note  Signed  Date of Service:  11/23/2018 12:21 PM          Signed         Show:Clear all _0 Manual_1 Template_2 Copied  Added by: _3 McNichol, Rudi Heap, RN  _4 Hover for details [place] Nurse wound consult note Reason for Consult:Stage 2 open area on left medial buttock at gluteal cleft.  I have discussed the area with both bedside RN, Christy and Dr. Sloan Leiter.  I appreciate the photograph provided to me by the bedside RN and via text to my personal phone and have deleted it following this consultation. All meas to protect the patient privacy with the technology assisted assessment were employed. It is noted that the patient has a history of OSA and uses HOB elevation to assist his breathing, increasing pressure on this lower buttock region. Wound type:Pressure Pressure Injury POA: No Measurement:2cm x 2cm x 0.2cm Wound bed:Red, moist with basement cell membrane (capillary buds) noted. Minute amount of yellow fibrinous tissue noted. Drainage (amount, consistency, odor) Small amount serous Periwound: intact  Dressing procedure/placement/frequency: I provided this consultation today via technology with assistance from both Dr. Sloan Leiter and the patient's bedside nurse, Alyse Low, RN.  Their assistance is appreciated. A photograph was sent to this writer to verify verbal assessment. The POC is provided for topical care including cleansing, topping with a size-appropriate piece of xeroform gauze topped with a dry gauze 2x2 and covered with our house silicone foam dressing.  Patient to offload this area by attempting to lie on his alternating sides while in bed, minimizing time in the supine position. A pressure redistribution chair pad is provided for his OOB use while in a chair.  [place] nursing team will not follow, but will remain available to this patient, the nursing and  medical teams.  Please re-consult if needed. Thanks, Maudie Flakes, MSN, RN, [place], Arther Abbott  Pager# [phone]                         Bowel mgmt: continent, last BM 7/28 Bladder mgmt: continent, urgency Diabetic mgmt: yes pta on Januvia and insulin Hgb A1c 7.3 Behavioral consideration  N/a Chemo/radiation  N/a   Previous Home Environment  Living Arrangements: (lives with daughter < [name] for about 3 years)  Lives With: Daughter Available Help at Discharge: Family, Available 24 hours/day(daughter working remotely since 06/2018; Child psychotherapist f) Type of Home: [place]: One level Home Access: Stairs to enter [employer] of Steps: steps through garage modified so about 2 steps per daughter Bathroom Shower/Tub: Chiropodist: Standard Bathroom Accessibility: Yes How Accessible: Accessible via walker [place]: No Additional Comments: was to start outpt therapy prior to admission as referred by his neurologist  Discharge Living Setting Plans for Discharge Living Setting: Lives with (comment)(daughter, [name] for about 3 years) Type of Home at Discharge: House Discharge Home Layout: One level Discharge Home Access: Stairs to enter Entrance Stairs-Rails: None Entrance Stairs-Number of Steps: steps through garage modifeid for easy entry , about 2 steps Discharge Bathroom Shower/Tub: Tub/shower unit, Curtain Discharge Bathroom Toilet: Standard Discharge Bathroom Accessibility: Yes How Accessible: Accessible via walker Does the patient have any problems obtaining your medications?: No  Social/Family/Support Systems Patient Roles: Parent Contact Information: [name], daughter Anticipated Caregiver: daughter Anticipated Caregiver's Contact Information: [phone] Ability/Limitations of Caregiver: works remotely since 06/2018. Child psychotherapist for [employer] Caregiver Availability: 24/7 Discharge Plan Discussed  with Primary Caregiver: Yes Is Caregiver In Agreement with Plan?: Yes Does Caregiver/Family have Issues with Lodging/Transportation while Pt is in Rehab?: No  Goals/Additional Needs Patient/Family Goal for Rehab: supervision to min asisst with PT and OT Expected length of stay: ELOS 14 to 20 days Pt/Family Agrees to Admission and willing to participate: Yes Program Orientation Provided & Reviewed with Pt/Caregiver Including Roles  & Responsibilities: Yes  Decrease burden  of Care through IP rehab admission: n/a  Possible need for SNF placement upon discharge: not anticipated  Patient Condition: I have reviewed medical records from [facility] , spoken with daughter. I discussed via phone for inpatient rehabilitation assessment.  Patient will benefit from ongoing PT and OT, can actively participate in 3 hours of therapy a day 5 days of the week, and can make measurable gains during the admission.  Patient will also benefit from the coordinated team approach during an Inpatient Acute Rehabilitation admission.  The patient will receive intensive therapy as well as Rehabilitation physician, nursing, social worker, and care management interventions.  Due to bladder management, bowel management, safety, skin/wound care, disease management, medication administration, pain management and patient education the patient requires 24 hour a day rehabilitation nursing.  The patient is currently min with mobility and basic ADLs.  Discharge setting and therapy post discharge at home with home health is anticipated.  Patient has agreed to participate in the Acute Inpatient Rehabilitation Program and will admit today.  Preadmission Screen Completed By: Danne Baxter, RN MSN with updates by Shann Medal, PT, DPT, 12/01/2018 10:41 AM ______________________________________________________________________   Discussed status with Dr. Naaman Plummer on 12/01/18  at 10:41 AM  and received approval for admission  today.  Admission Coordinator: Danne Baxter, RN MSN,  Michel Santee, PT, DPT time 10:41 AM Sudie Grumbling 12/01/18    Assessment/Plan: Diagnosis: debillity 1. Does the need for close, 24 hr/day Medical supervision in concert with the patient's rehab needs make it unreasonable for this patient to be served in a less intensive setting? Yes 2. Co-Morbidities requiring supervision/potential complications: CHF, multiple pulmonary considerations, DM, HTn, OSA 3. Due to bladder management, bowel management, safety, skin/wound care, disease management, medication administration, pain management and patient education, does the patient require 24 hr/day rehab nursing? Yes 4. Does the patient require coordinated care of a physician, rehab nurse, PT (1-2 hrs/day, 5 days/week) and OT (1-2 hrs/day, 5 days/week) to address physical and functional deficits in the context of the above medical diagnosis(es)? Yes Addressing deficits in the following areas: balance, endurance, locomotion, strength, transferring, bowel/bladder control, bathing, dressing, feeding, grooming, toileting and psychosocial support 5. Can the patient actively participate in an intensive therapy program of at least 3 hrs of therapy 5 days a week? Yes 6. The potential for patient to make measurable gains while on inpatient rehab is excellent 7. Anticipated functional outcomes upon discharge from inpatients are: supervision and min assist PT, supervision and min assist OT, n/a SLP 8. Estimated rehab length of stay to reach the above functional goals is: 14-20 days 9. Anticipated D/C setting: Home 10. Anticipated post D/C treatments: [place] therapy 11. Overall Rehab/Functional Prognosis: excellent  MD Signature: Meredith Staggers, MD, [facility] 12/01/2018

## 2018-11-26 NOTE — Progress Notes (Signed)
PROGRESS NOTE                                                                                                                                                                                                             Patient Demographics:    Andrew Larsen, is a 78 y.o. male, DOB - [birth year], YIF:[id number]  Outpatient Primary MD for the patient is Velna Hatchet, MD    LOS - 21  Chief Complaint  Patient presents with   Covid 19   Cough       Brief Narrative: Patient is a 78 y.o. male with PMHx of DM-2, HTN, BPH, PAD, OSA-who presented with dry cough, shortness of breath-was found to have acute hypoxic respiratory failure in the setting of COVID-19 pneumonia.  Hospital course complicated by worsening hypoxemia-necessitating transfer to the intensive care unit.  Treated with Actemra, convalescent plasma, steroids and Remdesivir.  Oxygen requirements are slowly coming down-he remains very deconditioned with plans to discharge to CIR.  See below for further details   Subjective:    Andrew Larsen remains essentially the same-his oxygen requirements have gradually come down-but has plateaued over the past few days-anywhere between 5-8 L of oxygen.  Some mild epistaxis last night that has since resolved   Assessment  & Plan :   Acute Hypoxic Resp Failure due to Covid 19 Viral pneumonia: Slow clinical improvement continues-on anywhere from 5-8 L of high flow oxygen to maintain O2 sats.  Has completed a course of Remdesivir, is s/p Actemra and convalescent plasma.  No evidence of volume overload-but will give 1 dose of Lasix to maintain negative balance.  Once his oxygen requirements are down to approximately 4 L of oxygen-plans are to discharge to CIR (repeat COVID test on 7/26 neg,have ordered second COVID PCR on 7/29).  Continued attempts to slowly down titrate FiO2 as much as possible-continue supportive care-incentive  spirometry-continue to taper steroids-may be a good idea to slowly taper of steroids given recent diagnosis of inclusion body myositis.    COVID-19 Labs:  Recent Labs    11/24/18 0615 11/25/18 0450 11/26/18 0600  DDIMER 1.72* 1.32* 1.21*  FERRITIN 300 270  --   CRP <0.8 <0.8  --     Lab Results  Component Value Date   SARSCOV2NAA NOT DETECTED 11/23/2018   SARSCOV2NAA POSITIVE (A) 11/03/2018  COVID-19 Medications: 7/10 and 7/11>> Actemra 7/11>> convalescent plasma 7/10-7/14>> Remdesivir 7/8>>7/27>>Solumedrol, Prednisone 20 mg 7/27>>7/29, Prednisone 10 mg 7/29>>  Mild epistaxis: Occurred on 7/22-resolved-but apparently occurred last night-continue nasal saline spray-we will restart Afrin x2 days  Hyperkalemia: Occurred on 7/24-treated with IV insulin/dextrose and 1 dose of Lokelma-resolved.    Chronic diastolic heart failure: Clinically compensated-repeat 1 more dose of Lasix today-no signs of volume overload but would like to keep this patient in a negative balance today as well.  Overall > -10 L so far  DM-2 (A1c 7.3 on 11/08/2018): CBG stable-in fact on the higher side this morning-has had issues with morning hypoglycemic episodes over the past few days-since steroids have been discontinued-we will watch 1 more day on current regimen of insulin 70/30 25 units in a.m. and 10 units in p.m.  Follow.   CKD stage III: Creatinine close to usual baseline-follow periodically  HTN: Currently controlled-not on any antihypertensives-follow.  CAD: No anginal symptoms-continue aspirin  PAD: Appears stable-continue cilostazol and aspirin  BPH: Continue Flomax  OSA: Unable to use CPAP-continue high flow oxygen at night  Diabetic peripheral neuropathy: Continue Neurontin  Recently diagnosed inclusion body myositis: Follow with neurology at [facility]-Dr. Warnell Forester [phone] steroids as above-continue PT/OT.  Deconditioning/debility: Secondary to acute illness-also has inclusion body  myositis as baseline.  PT/OT recommending CIR on discharge ( COVID-19 PCR 7/26-negative, COVID PCR on 7/29 pending).    Stage 2 sacral decubitus:per wound care RN  ABG:    Component Value Date/Time   PHART 7.459 (H) 11/07/2018 1312   PCO2ART 30.5 (L) 11/07/2018 1312   PO2ART 46.0 (L) 11/07/2018 1312   HCO3 21.7 11/07/2018 1312   TCO2 23 11/07/2018 1312   ACIDBASEDEF 1.0 11/07/2018 1312   O2SAT 85.0 11/07/2018 1312    Condition -  Guarded  Family Communication  : Spoke with patient's daughter over the phone on 7/29  Code Status :  Full Code  Diet :  Diet Order            Diet regular Room service appropriate? Yes; Fluid consistency: Thin  Diet effective now               Disposition Plan  :  Remain inpatient  Consults  :  PCCM  Procedures  :   7/10>>7/22 left IJ CVL 7/24>>PICC 7/24>> lower extremity Doppler negative for DVT  DVT Prophylaxis  :  Lovenox   Lab Results  Component Value Date   PLT 118 (L) 11/26/2018     Inpatient Medications  Scheduled Meds:  sodium chloride   Intravenous Once   aspirin  81 mg Oral QHS   bisacodyl  10 mg Oral Once   brimonidine  1 drop Both Eyes BID   Chlorhexidine Gluconate Cloth  6 each Topical Daily   cilostazol  100 mg Oral BID   enoxaparin (LOVENOX) injection  60 mg Subcutaneous Q24H   famotidine  20 mg Oral QHS   folic acid  1 mg Oral Daily   gabapentin  600 mg Oral TID   insulin aspart  0-20 Units Subcutaneous TID WC   insulin aspart protamine- aspart  10 Units Subcutaneous Q supper   insulin aspart protamine- aspart  25 Units Subcutaneous Q breakfast   Ipratropium-Albuterol  2 puff Inhalation QID   latanoprost  1 drop Both Eyes QHS   mouth rinse  15 mL Mouth Rinse BID   oxybutynin  5 mg Oral BID   pantoprazole  40 mg Oral Daily  polyethylene glycol  17 g Oral Once   predniSONE  20 mg Oral Q breakfast   senna-docusate  2 tablet Oral BID   sodium chloride flush  10-40 mL Intracatheter  Q12H   sodium chloride flush  3 mL Intravenous Q12H   tamsulosin  0.4 mg Oral Daily   thiamine  100 mg Oral Daily   timolol  1 drop Both Eyes BID   vitamin C  500 mg Oral Daily   zinc sulfate  220 mg Oral Daily   Continuous Infusions: PRN Meds:.acetaminophen, alum & mag hydroxide-simeth, benzonatate, chlorpheniramine-HYDROcodone, guaiFENesin-dextromethorphan, ondansetron **OR** ondansetron (ZOFRAN) IV, senna-docusate, sodium chloride, sodium chloride flush  Antibiotics  :    Anti-infectives (From admission, onward)   Start     Dose/Rate Route Frequency Ordered Stop   11/08/18 1330  remdesivir 100 mg in sodium chloride 0.9 % 250 mL IVPB     100 mg 500 mL/hr over 30 Minutes Intravenous Every 24 hours 11/07/18 1229 11/11/18 1304   11/07/18 1330  remdesivir 200 mg in sodium chloride 0.9 % 250 mL IVPB     200 mg 500 mL/hr over 30 Minutes Intravenous Once 11/07/18 1229 11/08/18 1529       Time Spent in minutes  25   Oren Binet M.D on 11/26/2018 at 11:31 AM  To page go to [email or web address] - use universal password  [facility] -  Office  [phone]   See all Orders from today for further details   Admit date - 11/03/2018    21    Objective:   Vitals:   11/26/18 0010 11/26/18 0407 11/26/18 0500 11/26/18 0737  BP: 103/73 127/79  118/85  Pulse:    99  Resp:    18  Temp: 98 F (36.7 C) 97.8 F (36.6 C)  98.2 F (36.8 C)  TempSrc: Oral Oral  Oral  SpO2:    98%  Weight:   115.3 kg   Height:        Wt Readings from Last 3 Encounters:  11/26/18 115.3 kg  05/06/18 117.9 kg  04/28/18 122.8 kg     Intake/Output Summary (Last 24 hours) at 11/26/2018 1131 Last data filed at 11/26/2018 0737 Gross per 24 hour  Intake --  Output 2060 ml  Net -2060 ml    Physical Exam Gen Exam:Alert awake-not in any distress HEENT:atraumatic, normocephalic Chest: B/L clear to auscultation anteriorly CVS:S1S2 regular Abdomen:soft non tender, non  distended Extremities:no edema Neurology: Non focal Skin: no rash   Data Review:    CBC Recent Labs  Lab 11/22/18 0042 11/23/18 0500 11/24/18 0615 11/25/18 0450 11/26/18 0600  WBC 13.0* 8.1 8.2 7.4 6.3  HGB 15.8 14.6 14.6 14.0 13.2  HCT 46.3 43.5 43.8 40.2 38.0*  PLT 178 145* 129* 126* 118*  MCV 90.3 89.9 90.1 90.1 90.3  MCH 30.8 30.2 30.0 31.4 31.4  MCHC 34.1 33.6 33.3 34.8 34.7  RDW 14.0 13.8 14.1 14.3 14.2    Chemistries  Recent Labs  Lab 11/22/18 0042 11/23/18 0500 11/24/18 0615 11/25/18 0450 11/26/18 0600  NA 133* 132* 135 131* 132*  K 4.3 4.7 4.3 4.4 4.7  CL 94* 95* 96* 94* 95*  CO2 26 27 28 28 29   GLUCOSE 120* 231* 68* 273* 297*  BUN 42* 36* 35* 35* 35*  CREATININE 1.50* 1.24 1.38* 1.34* 1.39*  CALCIUM 9.4 9.0 8.7* 8.7* 8.9  AST 21 17 17 16 16   ALT 45* 38 36 34 31  ALKPHOS 85 67 63 60  62  BILITOT 0.6 0.9 0.9 0.9 0.8   ------------------------------------------------------------------------------------------------------------------ No results for input(s): CHOL, HDL, LDLCALC, TRIG, CHOLHDL, LDLDIRECT in the last 72 hours.  Lab Results  Component Value Date   HGBA1C 7.3 (H) 11/08/2018   ------------------------------------------------------------------------------------------------------------------ No results for input(s): TSH, T4TOTAL, T3FREE, THYROIDAB in the last 72 hours.  Invalid input(s): FREET3 ------------------------------------------------------------------------------------------------------------------ Recent Labs    11/24/18 0615 11/25/18 0450  FERRITIN 300 270    Coagulation profile No results for input(s): INR, PROTIME in the last 168 hours.  Recent Labs    11/25/18 0450 11/26/18 0600  DDIMER 1.32* 1.21*    Cardiac Enzymes No results for input(s): CKMB, TROPONINI, MYOGLOBIN in the last 168 hours.  Invalid input(s):  CK ------------------------------------------------------------------------------------------------------------------    Component Value Date/Time   BNP 29.4 11/21/2018 0345    Micro Results Recent Results (from the past 240 hour(s))  Novel Coronavirus, NAA (hospital order; send-out to ref lab)     Status: None   Collection Time: 11/23/18 10:38 AM   Specimen: Nasopharyngeal Swab; Respiratory  Result Value Ref Range Status   SARS-CoV-2, NAA NOT DETECTED NOT DETECTED Final    Comment: (NOTE) This test was developed and its performance characteristics determined by [employer]. This test has not been FDA cleared or approved. This test has been authorized by FDA under an Emergency Use Authorization (EUA). This test is only authorized for the duration of time the declaration that circumstances exist justifying the authorization of the emergency use of in vitro diagnostic tests for detection of SARS-CoV-2 virus and/or diagnosis of COVID-19 infection under section 564(b)(1) of the Act, 21 U.S.C. 629BMW-4(X)(3), unless the authorization is terminated or revoked sooner. When diagnostic testing is negative, the possibility of a false negative result should be considered in the context of a patient's recent exposures and the presence of clinical signs and symptoms consistent with COVID-19. An individual without symptoms of COVID-19 and who is not shedding SARS-CoV-2 virus would expect to have a negative (not detected) result in this assay. Performed  At: [facility] [place], [place] [id number] Rush Farmer MD ZD:[id number]    [place]  Final    Comment: Performed at [facility] [address] [place], [place] [address]    Radiology Reports Dg Chest Port 1 View  Result Date: 11/18/2018 CLINICAL DATA:  Hypoxia. EXAM: PORTABLE CHEST 1 VIEW COMPARISON:  11/13/2018. FINDINGS: Left IJ line noted stable  position. Cardiomegaly. Diffuse bilateral severe pulmonary infiltrates/edema. No pleural effusion or pneumothorax. IMPRESSION: 1.  Left IJ line noted stable position. 2. Cardiomegaly. Diffuse bilateral severe pulmonary infiltrates/edema again noted. No interim change. Electronically Signed   By: Geraldine   On: 11/18/2018 16:16   Dg Chest Port 1 View  Result Date: 11/13/2018 CLINICAL DATA:  Acute respiratory failure with hypoxia. EXAM: PORTABLE CHEST 1 VIEW COMPARISON:  11/11/2018 FINDINGS: Left IJ central venous catheter unchanged with tip obliquely oriented over the junction of the brachiocephalic vein to SVC. Patient is rotated to the left. Lungs are hypoinflated demonstrate interval worsening of hazy central airspace opacification right worse than left. No definite effusion. Mild stable cardiomegaly. Remainder of the exam is unchanged. IMPRESSION: Interval worsening bilateral hazy central airspace opacification right worse than left likely asymmetric interstitial edema and less likely infection. Stable cardiomegaly.  Left IJ central venous catheter unchanged. Electronically Signed   By: Marin Olp M.D.   On: 11/13/2018 08:59   Dg Chest Port 1 View  Result Date: 11/11/2018  CLINICAL DATA:  Acute respiratory failure due to hypoxemia. EXAM: PORTABLE CHEST 1 VIEW COMPARISON:  Radiograph November 10, 2018. FINDINGS: Stable cardiomediastinal silhouette. Left internal jugular catheter is unchanged in position. No pneumothorax or significant pleural effusion is noted. Stable bilateral lung opacities are noted concerning for edema or pneumonia. Bony thorax is unremarkable. IMPRESSION: Stable bilateral lung opacities as described above. Electronically Signed   By: Marijo Conception M.D.   On: 11/11/2018 07:34   Dg Chest Port 1 View  Result Date: 11/10/2018 CLINICAL DATA:  Pneumatocele of lung EXAM: PORTABLE CHEST 1 VIEW COMPARISON:  Radiograph 11/10/2018 FINDINGS: Central venous line with tip in the distal  brachiocephalic vein unchanged. Stable cardiac silhouette. Fine multifocal airspace disease is slightly increased in density compared to prior as noted over the RIGHT hemidiaphragm and LEFT upper lobe. Low lung volumes. IMPRESSION: Mild increase in multifocal fine airspace disease. Electronically Signed   By: Suzy Bouchard M.D.   On: 11/10/2018 10:32   Dg Chest Port 1 View  Result Date: 11/10/2018 CLINICAL DATA:  78 year old male with COVID-19 pneumonia EXAM: PORTABLE CHEST 1 VIEW COMPARISON:  Prior chest x-ray 11/07/2018 FINDINGS: Stable position of left IJ central venous catheter. The catheter tip overlies the innominate vein. Improving appearance of multifocal patchy interstitial and airspace opacities bilaterally. There is a region of focal clearing in the left mid lung just superior to the cardiac margin which could represent a developing pneumatocele. No acute osseous abnormality. IMPRESSION: 1. Improving appearance of bilateral multifocal interstitial and airspace opacities compared to 11/07/2018. 2. Region of more focal and slightly rounded lucency in the left mid lung could represent a developing pneumatocele versus a region of more advanced clearing. 3. Stable position of left IJ central venous catheter. Electronically Signed   By: Jacqulynn Cadet M.D.   On: 11/10/2018 08:01   Dg Chest Port 1 View  Result Date: 11/07/2018 CLINICAL DATA:  Central line placement, confirm positioning. EXAM: PORTABLE CHEST 1 VIEW COMPARISON:  Numerous chest radiographs, most recently November 07, 2018, CTA chest Sep 19, 2015. FINDINGS: Interval placement of a left internal jugular approach central venous catheter. Catheter tip terminates near the confluence of the brachiocephalic vein and superior vena cava, could be advanced to insure positioning within the lower SVC/cavoatrial junction. Widespread bilateral airspace opacities are again seen most pronounced in the right mid and lower lung. No pneumothorax or effusion  is present. Cardiac silhouette remains enlarged. Cardiomediastinal contours are essentially unchanged from prior studies. No acute osseous or soft tissue abnormality. IMPRESSION: 1. Interval placement of a left internal jugular approach central venous catheter. Catheter tip terminates near the confluence of the brachiocephalic vein and superior vena cava, could be advanced 2-3 cm to ensure positioning within the lower SVC/cavoatrial junction. 2. No procedural complication identified. 3. Unchanged diffuse bilateral airspace disease. Electronically Signed   By: MD Lovena Le   On: 11/07/2018 19:06   Dg Chest Port 1 View  Result Date: 11/07/2018 CLINICAL DATA:  Shortness of breath EXAM: PORTABLE CHEST 1 VIEW COMPARISON:  November 05, 2018 FINDINGS: The heart size and mediastinal contours are stable. The heart size is enlarged. Hazy opacities are identified in the right lung and to a lesser degree in the left lung, worse compared to prior exam. There is no pleural effusion. The visualized skeletal structures are unremarkable. IMPRESSION: Bilateral pulmonary infiltrates are slightly worse compared to prior exam of November 05, 2018. Electronically Signed   By: Abelardo Diesel M.D.   On: 11/07/2018 12:10  Dg Chest Port 1 View  Result Date: 11/05/2018 CLINICAL DATA:  Acute respiratory disease secondary to COVID-19. EXAM: PORTABLE CHEST 1 VIEW 5:11 a.m. COMPARISON:  11/03/2018 and 03/25/2016 FINDINGS: Faint bilateral pulmonary infiltrates appear slightly improved at the bases. Heart size and pulmonary vascularity are normal. No effusions. No bone abnormality. IMPRESSION: Slight improvement in faint bilateral pulmonary infiltrates. Electronically Signed   By: Lorriane Shire M.D.   On: 11/05/2018 05:50   Dg Chest Port 1 View  Result Date: 11/03/2018 CLINICAL DATA:  Cough.  COVID-19. EXAM: PORTABLE CHEST 1 VIEW COMPARISON:  03/25/2016. FINDINGS: Mediastinum hilar structures normal. Cardiomegaly with mild pulmonary venous  congestion and bilateral interstitial prominence. CHF could present this fashion. Pneumonitis particularly in light of the patient's history of COVID-19 infection could also present this fashion. Small left pleural effusion cannot be excluded. IMPRESSION: 1. Cardiomegaly with mild pulmonary venous congestion and bilateral interstitial prominence. CHF could present in this fashion. Pneumonitis particularly in light of the patient's history of COVID-19 infection could also present this fashion. 2.  Small left pleural effusion cannot be excluded. Electronically Signed   By: Marcello Moores  Register   On: 11/03/2018 14:31   Dg Chest Port 1v Same Day  Result Date: 11/19/2018 CLINICAL DATA:  Pneumonia in a patient who is COVID-19 positive. EXAM: PORTABLE CHEST 1 VIEW COMPARISON:  Single-view of the chest 11/18/2018 and 11/13/2018. FINDINGS: Left IJ catheter is unchanged. Extensive bilateral airspace disease persists without change given differences in technique. Heart size is normal. No pneumothorax or pleural effusion. IMPRESSION: No notable change in extensive bilateral airspace disease. Electronically Signed   By: Inge Rise M.D.   On: 11/19/2018 11:51   Vas [place] Lower Extremity Venous (dvt)  Result Date: 11/21/2018  Lower Venous Study Indications: Swelling, and Edema. Other Indications: Elevated D Dimer. Risk Factors: COVID 19 and obesity. Performing Technologist: Darlina Sicilian RDCS  Examination Guidelines: A complete evaluation includes B-mode imaging, spectral Doppler, color Doppler, and power Doppler as needed of all accessible portions of each vessel. Bilateral testing is considered an integral part of a complete examination. Limited examinations for reoccurring indications may be performed as noted.  +---------+---------------+---------+-----------+----------+-------------------+  RIGHT     Compressibility Phasicity Spontaneity Properties Summary               +---------+---------------+---------+-----------+----------+-------------------+  CFV       Full            Yes       Yes                                         +---------+---------------+---------+-----------+----------+-------------------+  SFJ       Full                                                                  +---------+---------------+---------+-----------+----------+-------------------+  FV Prox   Full                                                                  +---------+---------------+---------+-----------+----------+-------------------+  FV Mid    Full                                                                  +---------+---------------+---------+-----------+----------+-------------------+  FV Distal Full                                                                  +---------+---------------+---------+-----------+----------+-------------------+  POP       Full            Yes       Yes                                         +---------+---------------+---------+-----------+----------+-------------------+  PTV       Full                                                                  +---------+---------------+---------+-----------+----------+-------------------+  PERO                                                       Not visualized well  +---------+---------------+---------+-----------+----------+-------------------+   +---------+---------------+---------+-----------+----------+-------+  LEFT      Compressibility Phasicity Spontaneity Properties Summary  +---------+---------------+---------+-----------+----------+-------+  CFV       Full            Yes       Yes                             +---------+---------------+---------+-----------+----------+-------+  SFJ       Full                                                      +---------+---------------+---------+-----------+----------+-------+  FV Prox   Full                                                       +---------+---------------+---------+-----------+----------+-------+  FV Mid    Full                                                      +---------+---------------+---------+-----------+----------+-------+  FV Distal Full                                                      +---------+---------------+---------+-----------+----------+-------+  POP       Full            Yes       Yes                             +---------+---------------+---------+-----------+----------+-------+  PTV       Full                                                      +---------+---------------+---------+-----------+----------+-------+  PERO      Full                                                      +---------+---------------+---------+-----------+----------+-------+     Summary: Right: There is no evidence of deep vein thrombosis in the lower extremity. No cystic structure found in the popliteal fossa. Left: There is no evidence of deep vein thrombosis in the lower extremity. No cystic structure found in the popliteal fossa.  *See table(s) above for measurements and observations. Electronically signed by Deitra Mayo MD on 11/21/2018 at 2:49:58 PM.    Final    [place] Ekg Site Rite  Result Date: 11/20/2018 If Site Rite image not attached, placement could not be confirmed due to current cardiac rhythm.  [place] Ekg Site Rite  Result Date: 11/19/2018 If [facility] image not attached, placement could not be confirmed due to current cardiac rhythm.

## 2018-11-27 DIAGNOSIS — L8990 Pressure ulcer of unspecified site, unspecified stage: Secondary | ICD-10-CM | POA: Insufficient documentation

## 2018-11-27 LAB — C-REACTIVE PROTEIN: CRP: <0.8 mg/dL (ref <1.0)

## 2018-11-27 LAB — GLUCOSE, CAPILLARY
Glucose-Capillary: 150 mg/dL — ABNORMAL HIGH (ref 70–99)
Glucose-Capillary: 354 mg/dL — ABNORMAL HIGH (ref 70–99)
Glucose-Capillary: 376 mg/dL — ABNORMAL HIGH (ref 70–99)
Glucose-Capillary: 294 mg/dL — ABNORMAL HIGH (ref 70–99)

## 2018-11-27 LAB — CBC
HCT: 40.1 % (ref 39.0–52.0)
Hemoglobin: 13.6 g/dL (ref 13.0–17.0)
MCH: 30.8 pg (ref 26.0–34.0)
MCHC: 33.9 g/dL (ref 30.0–36.0)
MCV: 90.9 fL (ref 80.0–100.0)
Platelets: 119 10*3/uL — ABNORMAL LOW (ref 150–400)
RBC: 4.41 MIL/uL (ref 4.22–5.81)
RDW: 14.5 % (ref 11.5–15.5)
WBC: 6.7 10*3/uL (ref 4.0–10.5)
nRBC: 0 % (ref 0.0–0.2)

## 2018-11-27 LAB — COMPREHENSIVE METABOLIC PANEL
ALT: 28 U/L (ref 0–44)
AST: 17 U/L (ref 15–41)
Albumin: 3.2 g/dL — ABNORMAL LOW (ref 3.5–5.0)
Alkaline Phosphatase: 60 U/L (ref 38–126)
Anion gap: 7 (ref 5–15)
BUN: 33 mg/dL — ABNORMAL HIGH (ref 8–23)
CO2: 31 mmol/L (ref 22–32)
Calcium: 9 mg/dL (ref 8.9–10.3)
Chloride: 96 mmol/L — ABNORMAL LOW (ref 98–111)
Creatinine, Ser: 1.34 mg/dL — ABNORMAL HIGH (ref 0.61–1.24)
GFR calc Af Amer: 59 mL/min — ABNORMAL LOW (ref >60)
GFR calc non Af Amer: 51 mL/min — ABNORMAL LOW (ref >60)
Glucose, Bld: 185 mg/dL — ABNORMAL HIGH (ref 70–99)
Potassium: 4.5 mmol/L (ref 3.5–5.1)
Sodium: 134 mmol/L — ABNORMAL LOW (ref 135–145)
Total Bilirubin: 0.8 mg/dL (ref 0.3–1.2)
Total Protein: 5.5 g/dL — ABNORMAL LOW (ref 6.5–8.1)

## 2018-11-27 LAB — FERRITIN: Ferritin: 199 ng/mL (ref 24–336)

## 2018-11-27 MED ORDER — INSULIN ASPART PROT & ASPART (70-30 MIX) 100 UNIT/ML ~~LOC~~ SUSP
28.0000 [IU] | Freq: Every day | SUBCUTANEOUS | Status: DC
  Administered 2018-11-28: 28 [IU] via SUBCUTANEOUS

## 2018-11-27 MED ORDER — FUROSEMIDE 20 MG PO TABS
40.0000 mg | ORAL_TABLET | Freq: Every day | ORAL | Status: DC
  Administered 2018-11-28 – 2018-12-01 (×4): 40 mg via ORAL
  Filled 2018-11-27 (×4): qty 2

## 2018-11-27 MED ORDER — INSULIN ASPART PROT & ASPART (70-30 MIX) 100 UNIT/ML ~~LOC~~ SUSP
12.0000 [IU] | Freq: Every day | SUBCUTANEOUS | Status: DC
  Administered 2018-11-27 – 2018-11-28 (×2): 12 [IU] via SUBCUTANEOUS

## 2018-11-27 MED ORDER — FUROSEMIDE 10 MG/ML IJ SOLN
40.0000 mg | Freq: Once | INTRAMUSCULAR | Status: AC
  Administered 2018-11-27: 40 mg via INTRAVENOUS
  Filled 2018-11-27: qty 4

## 2018-11-27 NOTE — Progress Notes (Signed)
[place] TEAM 1 - Stepdown/ICU TEAM  Andrew Larsen  MPN:[id number] DOB: [birth year] DOA: 11/03/2018 PCP: Velna Hatchet, MD    Brief Narrative:  78 year old with a history of DM 2, HTN, BPH, PAD, and OSA who presented with shortness of breath and was found to be in acute hypoxic respiratory failure with COVID pneumonia.  His hospital course has been complicated by progressive hypoxemia requiring transfer to the ICU.  He has been treated with Actemra, Remdesivir, steroids, and convalescent plasma.  Significant Events: 7/6 admit to [facility] via [place] [facility]  7/10 transfer to ICU 7/17 transfer to [facility]   COVID-19 specific Treatment: Steroid 7/8 > Remdesivir 7/10 > 7/14 Actemra 7/10, 7/11 Convalescent plasma 7/11  Subjective: The patient is sitting up in a chair.  He continues to require high flow nasal cannula oxygen support but does appear to be improving.  He can speak to me in full sentences.  He tells me he feels very weak in general.  He reports that his appetite is slowly improving.  He denies chest pain nausea vomiting or abdominal pain at this time.  I spent 35 minutes in the care of this patient to include reviewing labs, reviewing x-rays, reviewing medications and making changes as appropriate, interviewing the patient, and examining the patient.   Assessment & Plan:  COVID pneumonia - acute hypoxic respiratory failure Has completed treatment with Remdesivir, Actemra, and convalescent plasma -continue high flow nasal cannula oxygen support -wean as able -mobilize  Mild epistaxis Resolved at this time  Hyperkalemia Resolved at this time  Chronic diastolic heart failure We will attempt to gently diurese Filed Weights   11/25/18 0631 11/26/18 0500 11/27/18 0500  Weight: 114 kg 115.3 kg 115.1 kg    DM 2 with neuropathy and hyperglycemia A1c 7.3 -adjust treatment and follow  CKD stage III Creatinine stable  HTN Blood pressure controlled at this time  CAD  PAD   BPH  OSA  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 rate 14, height 5\' 11"  (1.803 m), weight 115.1 kg, SpO2 91 %.  Intake/Output Summary (Last 24 hours) at 11/27/2018 0924 Last data filed at 11/27/2018 0826 Gross per 24 hour  Intake 10 ml  Output 1525 ml  Net -1515 ml   Filed Weights   11/25/18 0631 11/26/18 0500 11/27/18 0500  Weight: 114 kg 115.3 kg 115.1 kg    Examination: General: No acute respiratory distress Lungs: Diffuse fine crackles with no wheezing Cardiovascular: Regular rate and rhythm without murmur gallop or rub normal S1 and S2 Abdomen: Nontender, nondistended, soft, bowel sounds positive, no rebound, no ascites, no appreciable mass Extremities: Trace bilateral lower extremity edema  CBC: Recent Labs  Lab 11/25/18 0450 11/26/18 0600 11/27/18 0518  WBC 7.4 6.3 6.7  HGB 14.0 13.2 13.6  HCT 40.2 38.0* 40.1  MCV 90.1 90.3 90.9  PLT 126* 118* 008*   Basic Metabolic Panel: Recent Labs  Lab 11/25/18 0450 11/26/18 0600 11/27/18 0518  NA 131* 132* 134*  K 4.4 4.7 4.5  CL 94* 95* 96*  CO2 28 29 31   GLUCOSE 273* 297* 185*  BUN 35* 35* 33*  CREATININE 1.34* 1.39* 1.34*  CALCIUM 8.7* 8.9 9.0   GFR: Estimated Creatinine Clearance: 60.5 mL/min (A) (by C-G formula based on SCr of 1.34 mg/dL (H)).  Liver Function Tests: Recent Labs  Lab 11/24/18 0615 11/25/18 0450 11/26/18 0600 11/27/18 0518  AST 17 16 16 17   ALT 36 34 31 28  ALKPHOS 63 60 62 60  BILITOT 0.9 0.9 0.8 0.8  PROT 5.7* 5.3* 5.3* 5.5*  ALBUMIN 3.3* 3.1* 3.1* 3.2*    HbA1C: Hgb A1c MFr Bld  Date/Time Value Ref Range Status   11/08/2018 05:00 AM 7.3 (H) 4.8 - 5.6 % Final    Comment:    (NOTE) Pre diabetes:          5.7%-6.4% Diabetes:              >6.4% Glycemic control for   <7.0% adults with diabetes   11/07/2018 05:00 AM 7.2 (H) 4.8 - 5.6 % Final    Comment:    (NOTE)         Prediabetes: 5.7 - 6.4         Diabetes: >6.4         Glycemic control for adults with diabetes: <7.0     CBG: Recent Labs  Lab 11/26/18 0735 11/26/18 1230 11/26/18 1655 11/26/18 2154 11/27/18 0807  GLUCAP 246* 359* 326* 225* 150*    Recent Results (from the past 240 hour(s))  Novel Coronavirus, NAA (hospital order; send-out to ref lab)     Status: None   Collection Time: 11/23/18 10:38 AM   Specimen: Nasopharyngeal Swab; Respiratory  Result Value Ref Range Status   SARS-CoV-2, NAA NOT DETECTED NOT DETECTED Final    Comment: (NOTE) This test was developed and its performance characteristics determined by [employer]. This test has not been FDA cleared or approved. This test has been authorized by FDA under an Emergency Use Authorization (EUA). This test is only authorized for the duration of time the declaration that circumstances exist justifying the authorization of the emergency use of in vitro diagnostic tests for detection of SARS-CoV-2 virus and/or diagnosis of COVID-19 infection under section 564(b)(1) of the Act, 21 U.S.C. 623JSE-8(B)(1), unless the authorization is terminated or revoked sooner. When diagnostic testing is negative, the possibility of a false negative result should be considered in the context of a patient's recent exposures and the presence of clinical signs and symptoms consistent with COVID-19. An individual without symptoms of COVID-19 and who is not shedding SARS-CoV-2 virus would expect to have a negative (not detected) result in this assay. Performed  At: [facility] [place], [place] [id number] Rush Farmer MD XT:[id number]    [place]  Final    Comment: Performed at [facility] [address], [place], [place] [address]     Scheduled Meds: . sodium chloride   Intravenous Once  . aspirin  81 mg Oral QHS  . bisacodyl  10 mg Oral Once  . brimonidine  1 drop Both Eyes BID  . Chlorhexidine Gluconate Cloth  6 each Topical Daily  . cilostazol  100 mg Oral BID  . enoxaparin (LOVENOX) injection  60 mg Subcutaneous Q24H  . famotidine  20 mg Oral QHS  . folic acid  1 mg Oral Daily  . gabapentin  600 mg Oral TID  . insulin aspart  0-20 Units Subcutaneous TID WC  . insulin aspart protamine- aspart  10 Units Subcutaneous Q supper  . insulin aspart protamine- aspart  25 Units Subcutaneous Q  breakfast  . Ipratropium-Albuterol  2 puff Inhalation QID  . latanoprost  1 drop Both Eyes QHS  . mouth rinse  15 mL Mouth Rinse BID  . metoprolol tartrate  25 mg Oral BID  . oxybutynin  5 mg Oral BID  . oxymetazoline  1 spray Each Nare BID  . pantoprazole  40 mg Oral Daily  . polyethylene glycol  17 g Oral Once  . predniSONE  10 mg Oral Q breakfast  . senna-docusate  2 tablet Oral BID  . sodium chloride flush  10-40 mL Intracatheter Q12H  . sodium chloride flush  3 mL Intravenous Q12H  . tamsulosin  0.4 mg Oral Daily  . thiamine  100 mg Oral Daily  . timolol  1 drop Both Eyes BID  . vitamin C  500 mg Oral Daily  . zinc sulfate  220 mg Oral Daily     LOS: 22 days   Cherene Altes, MD [facility] Office  [phone] Pager - Text Page per Amion  If 7PM-7AM, please contact night-coverage per Amion 11/27/2018, 9:24 AM

## 2018-11-27 NOTE — Progress Notes (Signed)
Physical Therapy Treatment Patient Details Name: Andrew Larsen MRN: [id number] DOB: 09/15/1940 Today's Date: 11/27/2018    History of Present Illness Pt is a 78 y.o. male admitted 11/03/18 with SOB and cough; found to have COVID-19. Hospital course complicated by worsening hypoxemia requirng transfer to ICU. 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    PT Comments    Patient has HFNC on 4 L at rest with finger probe, SaO2 95%. Placed forehead  Nelcor probe for ambulation. Patient placed on regular Senath at 4 L for ambulation. Patient ambulated x 10' with RW, R knee guarded closely due to weak quads. Patient's Sao2 lowest 69% on 4 L, increased to 6 L  With recovery to 80% in 2.5 minutes . Bumped back to 4 L with recovery to 85% in another 1.5 mins. Patient replaced  Back on the HFNC in room at 4 L with SaO2 fluctuating down to 74% with gradual return to > 90%. Patient frequently coughing which lowers saO2. Continue as much ambulation as possible.  Cough medication prior to therapy is advantageous for the pt.     5 within 1.5  Follow Up Recommendations  CIR;Supervision for mobility/OOB     Equipment Recommendations  None recommended by PT    Recommendations for Other Services Rehab consult     Precautions / Restrictions Precautions Precautions: Fall Precaution Comments: R > L leg weakness.right Hyperextends with stance, must monitor VS, trying to wean to </=4 [place]    Mobility  Bed Mobility   Bed Mobility: Rolling;Sidelying to Sit Rolling: Min guard Sidelying to sit: Min guard       General bed mobility comments: encouraged  pt. to self perform, asking for assistance. Pt. was close to rail which increased difficulty but did perform.  Transfers Overall transfer level: Needs assistance Equipment used: Rolling walker (2 wheeled) Transfers: Sit to/from  Stand Sit to Stand: Min guard;+2 safety/equipment         General transfer comment: MIn guard from higher surface, cues to reach to recliner which he did perform, continues with decreased ability to control descent to lower surface.  Ambulation/Gait Ambulation/Gait assistance: Min assist;+2 safety/equipment Gait Distance (Feet): 10 Feet Assistive device: Rolling walker (2 wheeled) Gait Pattern/deviations: Step-to pattern;Decreased step length - left;Decreased step length - right Gait velocity: Decreased   General Gait Details: Right knee hyperextends during stand, Ankle tends to invert. slight foot drop right foot. PT guarded the right knee during stance to guard against bucckling   Stairs             Wheelchair Mobility    Modified Rankin (Stroke Patients Only)       Balance   Sitting-balance support: Feet supported Sitting balance-Leahy Scale: Good     Standing balance support: Bilateral upper extremity supported;During functional activity   Standing balance comment: Reliant on UE support                            Cognition Arousal/Alertness: Awake/alert Behavior During Therapy: Flat affect                                          Exercises Other Exercises Other Exercises: standing, march in place x 5 each leg prior to ambulation with  seated rest break between    General Comments        Pertinent Vitals/Pain Pain Assessment: No/denies pain    Home Living                      Prior Function            PT Goals (current goals can now be found in the care plan section) Progress towards PT goals: Progressing toward goals    Frequency    Min 3X/week      PT Plan Current plan remains appropriate    Co-evaluation              AM-PAC PT "6 Clicks" Mobility   Outcome Measure  Help needed turning from your back to your side while in a flat bed without using bedrails?: None Help needed moving from  lying on your back to sitting on the side of a flat bed without using bedrails?: A Little Help needed moving to and from a bed to a chair (including a wheelchair)?: A Little Help needed standing up from a chair using your arms (e.g., wheelchair or bedside chair)?: A Little Help needed to walk in hospital room?: A Lot Help needed climbing 3-5 steps with a railing? : Total 6 Click Score: 16    End of Session Equipment Utilized During Treatment: Gait belt;Oxygen Activity Tolerance: Patient tolerated treatment well Patient left: in chair;with call bell/phone within reach Nurse Communication: Mobility status PT Visit Diagnosis: Unsteadiness on feet (R26.81);Muscle weakness (generalized) (M62.81);Repeated falls (R29.6)     Time: 5625-6389 PT Time Calculation (min) (ACUTE ONLY): 38 min  Charges:  $Gait Training: 38-52 mins                     Tresa Endo PT Acute Rehabilitation Services  Office [phone]    Claretha Cooper 11/27/2018, 10:24 AM

## 2018-11-27 NOTE — Plan of Care (Signed)
  Problem: Education: Goal: Knowledge of risk factors and measures for prevention of condition will improve Outcome: Progressing   Problem: Coping: Goal: Psychosocial and spiritual needs will be supported Outcome: Progressing   Problem: Respiratory: Goal: Will maintain a patent airway Outcome: Progressing Goal: Complications related to the disease process, condition or treatment will be avoided or minimized Outcome: Progressing   Problem: Education: Goal: Knowledge of General Education information will improve Description: Including pain rating scale, medication(s)/side effects and non-pharmacologic comfort measures Outcome: Progressing   Problem: Health Behavior/Discharge Planning: Goal: Ability to manage health-related needs will improve Outcome: Progressing   Problem: Clinical Measurements: Goal: Ability to maintain clinical measurements within normal limits will improve Outcome: Progressing Goal: Will remain free from infection Outcome: Progressing Goal: Diagnostic test results will improve Outcome: Progressing Goal: Respiratory complications will improve Outcome: Progressing Goal: Cardiovascular complication will be avoided Outcome: Progressing   Problem: Activity: Goal: Risk for activity intolerance will decrease Outcome: Progressing   Problem: Nutrition: Goal: Adequate nutrition will be maintained Outcome: Progressing   Problem: Coping: Goal: Level of anxiety will decrease Outcome: Progressing   Problem: Elimination: Goal: Will not experience complications related to urinary retention Outcome: Progressing   Problem: Pain Managment: Goal: General experience of comfort will improve Outcome: Progressing   Problem: Safety: Goal: Ability to remain free from injury will improve Outcome: Progressing   Problem: Skin Integrity: Goal: Risk for impaired skin integrity will decrease Outcome: Progressing   Problem: Education: Goal: Knowledge of risk factors  and measures for prevention of condition will improve Outcome: Progressing   Problem: Coping: Goal: Psychosocial and spiritual needs will be supported Outcome: Progressing   Problem: Respiratory: Goal: Will maintain a patent airway Outcome: Progressing Goal: Complications related to the disease process, condition or treatment will be avoided or minimized Outcome: Progressing

## 2018-11-28 ENCOUNTER — Inpatient Hospital Stay (HOSPITAL_COMMUNITY): Payer: Medicare HMO

## 2018-11-28 LAB — GLUCOSE, CAPILLARY
Glucose-Capillary: 137 mg/dL — ABNORMAL HIGH (ref 70–99)
Glucose-Capillary: 237 mg/dL — ABNORMAL HIGH (ref 70–99)
Glucose-Capillary: 287 mg/dL — ABNORMAL HIGH (ref 70–99)
Glucose-Capillary: 221 mg/dL — ABNORMAL HIGH (ref 70–99)

## 2018-11-28 LAB — NOVEL CORONAVIRUS, NAA (HOSP ORDER, SEND-OUT TO REF LAB; TAT 18-24 HRS): SARS-CoV-2, NAA: NOT DETECTED

## 2018-11-28 MED ORDER — INSULIN ASPART PROT & ASPART (70-30 MIX) 100 UNIT/ML ~~LOC~~ SUSP
34.0000 [IU] | Freq: Every day | SUBCUTANEOUS | Status: DC
  Administered 2018-11-29 – 2018-12-01 (×3): 34 [IU] via SUBCUTANEOUS

## 2018-11-28 MED ORDER — INSULIN ASPART PROT & ASPART (70-30 MIX) 100 UNIT/ML ~~LOC~~ SUSP
18.0000 [IU] | Freq: Every day | SUBCUTANEOUS | Status: DC
  Administered 2018-11-29 – 2018-11-30 (×2): 18 [IU] via SUBCUTANEOUS

## 2018-11-28 MED ORDER — ENSURE MAX PROTEIN PO LIQD
11.0000 [oz_av] | Freq: Two times a day (BID) | ORAL | Status: DC
  Administered 2018-11-28 – 2018-11-30 (×2): 11 [oz_av] via ORAL
  Filled 2018-11-28 (×9): qty 330

## 2018-11-28 NOTE — Progress Notes (Signed)
Inpatient Diabetes Program Recommendations  AACE/ADA: New Consensus Statement on Inpatient Glycemic Control (2015)  Target Ranges:  Prepandial:   less than 140 mg/dL      Peak postprandial:   less than 180 mg/dL (1-2 hours)      Critically ill patients:  140 - 180 mg/dL   Lab Results  Component Value Date   GLUCAP 237 (H) 11/28/2018   HGBA1C 7.3 (H) 11/08/2018    Review of Glycemic Control Results for Andrew Larsen, Andrew Larsen (MRN [id number]) as of 11/28/2018 13:08  Ref. Range 11/27/2018 08:07 11/27/2018 11:42 11/27/2018 17:11 11/27/2018 20:28 11/28/2018 07:55 11/28/2018 12:12  Glucose-Capillary Latest Ref Range: 70 - 99 mg/dL 150 (H) 354 (H) 376 (H) 294 (H) 137 (H) 237 (H)    Diabetes history: DM 2 Outpatient Diabetes medications:  Novolin 70/30- 50 units in AM and 30 units in PM Current orders for Inpatient glycemic control:  Novolog 70/30 28 units q AM and 12 units q PM Novolog resistant tid with meals PO prednisone 10 mg Daily  Inpatient Diabetes Program Recommendations:    -Consider increasing AM dose of 70/30 to 30 units q AM    Thanks,  Tama Headings RN, MSN, BC-ADM Inpatient Diabetes Coordinator Team Pager [phone] (8a-5p)

## 2018-11-28 NOTE — Progress Notes (Signed)
RN stated PICC line out to 5cm with dressing change. CXR obtained. Still in ok place to use. RN aware.

## 2018-11-28 NOTE — Plan of Care (Signed)
  Problem: Education: Goal: Knowledge of risk factors and measures for prevention of condition will improve Outcome: Progressing   Problem: Coping: Goal: Psychosocial and spiritual needs will be supported Outcome: Progressing   Problem: Respiratory: Goal: Will maintain a patent airway Outcome: Progressing Goal: Complications related to the disease process, condition or treatment will be avoided or minimized Outcome: Progressing   Problem: Education: Goal: Knowledge of General Education information will improve Description: Including pain rating scale, medication(s)/side effects and non-pharmacologic comfort measures Outcome: Progressing   Problem: Health Behavior/Discharge Planning: Goal: Ability to manage health-related needs will improve Outcome: Progressing   Problem: Clinical Measurements: Goal: Ability to maintain clinical measurements within normal limits will improve Outcome: Progressing Goal: Will remain free from infection Outcome: Progressing Goal: Diagnostic test results will improve Outcome: Progressing Goal: Respiratory complications will improve Outcome: Progressing Goal: Cardiovascular complication will be avoided Outcome: Progressing   Problem: Activity: Goal: Risk for activity intolerance will decrease Outcome: Progressing   Problem: Nutrition: Goal: Adequate nutrition will be maintained Outcome: Progressing   Problem: Coping: Goal: Level of anxiety will decrease Outcome: Progressing   Problem: Elimination: Goal: Will not experience complications related to urinary retention Outcome: Progressing   Problem: Pain Managment: Goal: General experience of comfort will improve Outcome: Progressing   Problem: Safety: Goal: Ability to remain free from injury will improve Outcome: Progressing   Problem: Skin Integrity: Goal: Risk for impaired skin integrity will decrease Outcome: Progressing   Problem: Education: Goal: Knowledge of risk factors  and measures for prevention of condition will improve Outcome: Progressing   Problem: Coping: Goal: Psychosocial and spiritual needs will be supported Outcome: Progressing   Problem: Respiratory: Goal: Will maintain a patent airway Outcome: Progressing Goal: Complications related to the disease process, condition or treatment will be avoided or minimized Outcome: Progressing

## 2018-11-28 NOTE — Progress Notes (Signed)
Initial Nutrition Assessment  DOCUMENTATION CODES:   Obesity unspecified  INTERVENTION:   Ensure Max po BID, each supplement provides 150 kcal and 30 grams of protein.   Pt receiving Hormel Shake daily with Breakfast which provides 520 kcals and 22 g of protein and Magic cup BID with lunch and dinner, each supplement provides 290 kcal and 9 grams of protein, automatically on meal trays to optimize nutritional intake.    NUTRITION DIAGNOSIS:   Increased nutrient needs related to acute illness(covid-19) as evidenced by estimated needs.  GOAL:   Patient will meet greater than or equal to 90% of their needs  MONITOR:   PO intake, Supplement acceptance  REASON FOR ASSESSMENT:   (wound)    ASSESSMENT:   Pt with PMH of DM, HTN, PAD, OSA who was admitted 7/6 for COVID PNA s/p actemra, remdesivir, steroids, and convalescent plasma.   Per MD pt is improving but continues to require supplemental O2.   Meal Completion: improving now 75% of his meals   Medications reviewed and include: folic acid, lasix, SSI, 70/30 28 units with breakfast, prednisone, senokot-s, thiamine, vitamin C, zinc Labs reviewed:  CBG's: [phone]    NUTRITION - FOCUSED PHYSICAL EXAM:  Deferred   Diet Order:   Diet Order            Diet regular Room service appropriate? Yes; Fluid consistency: Thin  Diet effective now              EDUCATION NEEDS:   No education needs have been identified at this time  Skin:  Skin Assessment: (stage II L buttocks)  Last BM:  7/30 - large  Height:   Ht Readings from Last 1 Encounters:  11/03/18 5\' 11"  (1.803 m)    Weight:   Wt Readings from Last 1 Encounters:  11/28/18 114.5 kg    Ideal Body Weight:  78.1 kg  BMI:  Body mass index is 35.21 kg/m.  Estimated Nutritional Needs:   Kcal:  2000-2300  Protein:  115-156 grams  Fluid:  >2 L/day   Maylon Peppers RD, LDN, CNSC [phone] Pager [phone] After Hours Pager

## 2018-11-28 NOTE — Progress Notes (Addendum)
Physical Therapy Treatment Patient Details Name: Andrew Larsen MRN: [id number] DOB: 10-29-1940 Today's Date: 11/28/2018    History of Present Illness Pt is a 78 y.o. male admitted 11/03/18 with SOB and cough; found to have COVID-19. Hospital course complicated by worsening hypoxemia requirng transfer to ICU. 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    PT Comments    Patient recently to [facility] and back to chair and requested to focus on LE strengthening exercises instead of trying to walk. See below for details. Hand out provided for 3 new exercises he can add to what he has been doing on his own. Pt remains very motivated and wants to get on to rehab setting.    Follow Up Recommendations  CIR;Supervision for mobility/OOB     Equipment Recommendations  None recommended by PT    Recommendations for Other Services       Precautions / Restrictions Precautions Precautions: Fall Precaution Comments: R > L leg weakness.right Hyperextends with stance, must monitor VS, trying to wean to </=4 [place] Restrictions Weight Bearing Restrictions: No    Mobility  Bed Mobility                  Transfers Overall transfer level: Needs assistance Equipment used: Rolling walker (2 wheeled) Transfers: Sit to/from Stand Sit to Stand: Min guard         General transfer comment: worked on initial phase of sit to stand x 5 reps (rests in between); pt initially overusing arms, but as he understood to make his legs do more, he was just able to lift his hips off the bed  Ambulation/Gait                 Stairs             Wheelchair Mobility    Modified Rankin (Stroke Patients Only)       Balance                                            Cognition Arousal/Alertness: Awake/alert Behavior During Therapy: WFL for tasks  assessed/performed Overall Cognitive Status: Within Functional Limits for tasks assessed                                        Exercises General Exercises - Lower Extremity Long Arc Quad: AROM;Both;Seated Hip ABduction/ADduction: AROM;Both;10 reps;Seated(legs up in recliner) Hip Flexion/Marching: AROM;Both;Seated Other Exercises Other Exercises: educated on (and given handout) for bridging exercise, however pt just recently OOB to chair and did not return to bed to practice    General Comments        Pertinent Vitals/Pain Pain Assessment: No/denies pain    Home Living                      Prior Function            PT Goals (current goals can now be found in the care plan section) Acute Rehab PT Goals Patient Stated Goal: Return home to family Time For Goal Achievement: 12/01/18 Potential to Achieve Goals: Good Progress towards PT goals: Progressing toward goals    Frequency    Min 3X/week  PT Plan Current plan remains appropriate    Co-evaluation              AM-PAC PT "6 Clicks" Mobility   Outcome Measure  Help needed turning from your back to your side while in a flat bed without using bedrails?: None Help needed moving from lying on your back to sitting on the side of a flat bed without using bedrails?: A Little Help needed moving to and from a bed to a chair (including a wheelchair)?: A Little Help needed standing up from a chair using your arms (e.g., wheelchair or bedside chair)?: A Little Help needed to walk in hospital room?: A Lot Help needed climbing 3-5 steps with a railing? : Total 6 Click Score: 16    End of Session Equipment Utilized During Treatment: Oxygen Activity Tolerance: Patient tolerated treatment well(with rest breaks and cough medicine prior) Patient left: in chair;with call bell/phone within reach   PT Visit Diagnosis: Unsteadiness on feet (R26.81);Muscle weakness (generalized) (M62.81);Repeated  falls (R29.6)     Time: 1224-4975 PT Time Calculation (min) (ACUTE ONLY): 39 min  Charges:  $Therapeutic Exercise: 38-52 mins                       Barry Brunner, PT       Rexanne Mano 11/28/2018, 3:01 PM   [employer] HEP handout created:  Access Code: [email or web address]  URL: [email or web address]  Date: 11/28/2018  Prepared by: Barry Brunner   Exercises Supine Bridge - 5 reps - 1 sets - 2x daily - 5x weekly Supine Hip Abduction - 5-10 reps - 1 sets - - hold - 2x daily - 5x weekly Proper Sit to Stand Technique - 5 reps - 1 sets - - hold - 2x daily - 5x weekly

## 2018-11-28 NOTE — Progress Notes (Signed)
[place] TEAM 1 - Stepdown/ICU TEAM  Andrew Larsen  KDX:[id number] DOB: [birth year] DOA: 11/03/2018 PCP: Velna Hatchet, MD    Brief Narrative:  78 year old with a history of DM 2, HTN, BPH, PAD, and OSA who presented with shortness of breath and was found to be in acute hypoxic respiratory failure with COVID pneumonia.  His hospital course has been complicated by progressive hypoxemia requiring transfer to the ICU.  He has been treated with Actemra, Remdesivir, steroids, and convalescent plasma.  Significant Events: 7/6 admit to [facility] via [place] ED  7/10 transfer to ICU 7/17 [facility] to [facility]   COVID-19 specific Treatment: Steroid 7/8 > Remdesivir 7/10 > 7/14 Actemra 7/10, 7/11 Convalescent plasma 7/11  Subjective: The patient is resting comfortably in bed.  He has no new complaints.  He appears comfortable.  There is no evidence of distress whatsoever.  Assessment & Plan:  COVID pneumonia - acute hypoxic respiratory failure Has completed treatment with Remdesivir, Actemra, and convalescent plasma - continue high flow nasal cannula oxygen support - wean oxygen as able - mobilize -waiting on bed and CIR  Mild epistaxis Resolved   Hyperkalemia Resolved   Chronic diastolic heart failure Continue maintenance/home dose diuretic  Filed Weights   11/26/18 0500 11/27/18 0500 11/28/18 0508  Weight: 115.3 kg 115.1 kg 114.5 kg    DM 2 with neuropathy and hyperglycemia A1c 7.3 -wean steroids to off ASAP -adjust treatment regimen and continue to follow CBG  CKD stage III Creatinine stable  HTN Blood pressure controlled at this time  CAD Asymptomatic  PAD Asymptomatic  BPH Asymptomatic  OSA  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 rate 19, height 5\' 11"  (1.803 m), weight 114.5 kg, SpO2 (!) 81 %.  Intake/Output Summary (Last 24 hours) at 11/28/2018 0948 Last data filed at 11/28/2018 0500 Gross per 24 hour  Intake -  Output 2625 ml  Net -2625 ml   Filed Weights   11/26/18 0500 11/27/18 0500 11/28/18 0508  Weight: 115.3 kg 115.1 kg 114.5 kg    Examination: General: No acute respiratory distress Lungs: Fine bilateral crackles with good air movement and no wheezing Cardiovascular: RRR Abdomen: NT/ND, soft, bowel sounds positive Extremities: Trace bilateral lower extremity edema without change  CBC: Recent Labs  Lab 11/25/18 0450 11/26/18 0600 11/27/18 0518  WBC 7.4 6.3 6.7  HGB 14.0 13.2 13.6  HCT 40.2 38.0* 40.1  MCV 90.1 90.3 90.9  PLT 126* 118* 976*   Basic Metabolic Panel: Recent Labs  Lab 11/25/18 0450 11/26/18 0600 11/27/18 0518  NA 131* 132* 134*  K 4.4 4.7 4.5  CL 94* 95* 96*  CO2 28 29 31   GLUCOSE 273* 297* 185*  BUN 35* 35* 33*  CREATININE 1.34* 1.39* 1.34*  CALCIUM 8.7* 8.9 9.0   GFR: Estimated Creatinine Clearance: 60.4 mL/min (A) (by C-G formula based on SCr of 1.34 mg/dL (H)).  Liver Function Tests: Recent Labs  Lab 11/24/18 0615 11/25/18 0450 11/26/18 0600 11/27/18 0518  AST 17 16 16 17   ALT 36 34 31 28  ALKPHOS 63 60 62 60  BILITOT  0.9 0.9 0.8 0.8  PROT 5.7* 5.3* 5.3* 5.5*  ALBUMIN 3.3* 3.1* 3.1* 3.2*    HbA1C: Hgb A1c MFr Bld  Date/Time Value Ref Range Status  11/08/2018 05:00 AM 7.3 (H) 4.8 - 5.6 % Final    Comment:    (NOTE) Pre diabetes:          5.7%-6.4% Diabetes:              >6.4% Glycemic control for   <7.0% adults with diabetes   11/07/2018 05:00 AM 7.2 (H) 4.8 - 5.6 % Final    Comment:    (NOTE)         Prediabetes: 5.7 - 6.4          Diabetes: >6.4         Glycemic control for adults with diabetes: <7.0     CBG: Recent Labs  Lab 11/27/18 0807 11/27/18 1142 11/27/18 1711 11/27/18 2028 11/28/18 0755  GLUCAP 150* 354* 376* 294* 137*    Recent Results (from the past 240 hour(s))  Novel Coronavirus, NAA (hospital order; send-out to ref lab)     Status: None   Collection Time: 11/23/18 10:38 AM   Specimen: Nasopharyngeal Swab; Respiratory  Result Value Ref Range Status   SARS-CoV-2, NAA NOT DETECTED NOT DETECTED Final    Comment: (NOTE) This test was developed and its performance characteristics determined by [employer]. This test has not been FDA cleared or approved. This test has been authorized by FDA under an Emergency Use Authorization (EUA). This test is only authorized for the duration of time the declaration that circumstances exist justifying the authorization of the emergency use of in vitro diagnostic tests for detection of SARS-CoV-2 virus and/or diagnosis of COVID-19 infection under section 564(b)(1) of the Act, 21 U.S.C. 929WKM-6(K)(8), unless the authorization is terminated or revoked sooner. When diagnostic testing is negative, the possibility of a false negative result should be considered in the context of a patient's recent exposures and the presence of clinical signs and symptoms consistent with COVID-19. An individual without symptoms of COVID-19 and who is not shedding SARS-CoV-2 virus would expect to have a negative (not detected) result in this assay. Performed  At: [facility] [place], [place] [id number] Rush Farmer MD FB:[id number]    [place]  Final    Comment: Performed at [facility] [address] [place], [place] [address]     Scheduled Meds: . aspirin  81 mg Oral QHS  . brimonidine  1 drop Both Eyes BID  . Chlorhexidine Gluconate Cloth  6 each Topical Daily  . cilostazol  100 mg  Oral BID  . enoxaparin (LOVENOX) injection  60 mg Subcutaneous Q24H  . famotidine  20 mg Oral QHS  . folic acid  1 mg Oral Daily  . furosemide  40 mg Oral Daily  . gabapentin  600 mg Oral TID  . insulin aspart  0-20 Units Subcutaneous TID WC  . insulin aspart protamine- aspart  12 Units Subcutaneous Q supper  . insulin aspart protamine- aspart  28 Units Subcutaneous Q breakfast  . Ipratropium-Albuterol  2 puff Inhalation QID  . latanoprost  1 drop Both Eyes QHS  . mouth rinse  15 mL Mouth Rinse BID  . metoprolol tartrate  25 mg Oral BID  . oxybutynin  5 mg Oral BID  . pantoprazole  40 mg Oral Daily  . predniSONE  10 mg Oral Q breakfast  . senna-docusate  2  tablet Oral BID  . sodium chloride flush  10-40 mL Intracatheter Q12H  . sodium chloride flush  3 mL Intravenous Q12H  . tamsulosin  0.4 mg Oral Daily  . thiamine  100 mg Oral Daily  . timolol  1 drop Both Eyes BID  . vitamin C  500 mg Oral Daily  . zinc sulfate  220 mg Oral Daily     LOS: 23 days   Cherene Altes, MD [facility] Office  [phone] Pager - Text Page per Amion  If 7PM-7AM, please contact night-coverage per Amion 11/28/2018, 9:48 AM

## 2018-11-28 NOTE — Progress Notes (Signed)
Inpatient Rehab Admissions Coordinator:    I have insurance authorization for pt to admit to CIR.  Will plan for admission early next week pending bed availability and that pt remains medical stable.  Will f/u on Monday.    Shann Medal, PT, DPT Admissions Coordinator [phone] 11/28/18  3:34 PM

## 2018-11-29 LAB — GLUCOSE, CAPILLARY
Glucose-Capillary: 149 mg/dL — ABNORMAL HIGH (ref 70–99)
Glucose-Capillary: 241 mg/dL — ABNORMAL HIGH (ref 70–99)
Glucose-Capillary: 250 mg/dL — ABNORMAL HIGH (ref 70–99)
Glucose-Capillary: 229 mg/dL — ABNORMAL HIGH (ref 70–99)

## 2018-11-29 NOTE — Progress Notes (Signed)
PT Cancellation Note  Patient Details Name: Andrew Larsen MRN: [id number] DOB: [birth year]   Cancelled Treatment:    Reason Eval/Treat Not Completed: Fatigue/lethargy limiting ability to participate, states that he did not sleep .Wants a day off. Encouraged pt. To get uplater with Nsg.   Claretha Cooper 11/29/2018, 11:52 AM

## 2018-11-29 NOTE — Progress Notes (Signed)
[place] TEAM 1 - Stepdown/ICU TEAM  BENTLIE CATANZARO  RJJ:[id number] DOB: 1941-03-26 DOA: 11/03/2018 PCP: Velna Hatchet, MD    Brief Narrative:  78 year old with a history of DM 2, HTN, BPH, PAD, and OSA who presented with shortness of breath and was found to be in acute hypoxic respiratory failure with COVID pneumonia.  His hospital course has been complicated by progressive hypoxemia requiring transfer to the ICU.  He has been treated with Actemra, Remdesivir, steroids, and convalescent plasma.  Significant Events: 7/6 admit to [facility] via [place] ED  7/10 transfer to ICU 7/17 transfer to [facility]   COVID-19 specific Treatment: Steroid 7/8 > Remdesivir 7/10 > 7/14 Actemra 7/10, 7/11 Convalescent plasma 7/11  Subjective: Chart reviewed.  Patient clinically stable at this time.  Awaiting bed at inpatient rehab facility.  Assessment & Plan:  COVID pneumonia - acute hypoxic respiratory failure Has completed treatment with Remdesivir, Actemra, and convalescent plasma - continue high flow nasal cannula oxygen support - wean oxygen as able - mobilize -waiting on bed at CIR  Mild epistaxis Resolved   Hyperkalemia Resolved   Chronic diastolic heart failure Continue maintenance/home dose diuretic  Filed Weights   11/26/18 0500 11/27/18 0500 11/28/18 0508  Weight: 115.3 kg 115.1 kg 114.5 kg    DM 2 with neuropathy and hyperglycemia A1c 7.3 -wean steroids to off ASAP -adjust treatment regimen and continue to follow CBG  CKD stage III Creatinine stable  HTN Blood pressure controlled at this time  CAD Asymptomatic  PAD Asymptomatic  BPH Asymptomatic  OSA  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 rate (!) 21, height 5\' 11"  (1.803 m), weight 114.5 kg, SpO2 95 %.  Intake/Output Summary (Last 24 hours) at 11/29/2018 1407 Last data filed at 11/29/2018 1053 Gross per 24 hour  Intake 10 ml  Output -  Net 10 ml   Filed Weights   11/26/18 0500 11/27/18 0500 11/28/18 0508  Weight: 115.3 kg 115.1 kg 114.5 kg    Examination: No exam today  CBC: Recent Labs  Lab 11/25/18 0450 11/26/18 0600 11/27/18 0518  WBC 7.4 6.3 6.7  HGB 14.0 13.2 13.6  HCT 40.2 38.0* 40.1  MCV 90.1 90.3 90.9  PLT 126* 118* 016*   Basic Metabolic Panel: Recent Labs  Lab 11/25/18 0450 11/26/18 0600 11/27/18 0518  NA 131* 132* 134*  K 4.4 4.7 4.5  CL 94* 95* 96*  CO2 28 29 31   GLUCOSE 273* 297* 185*  BUN 35* 35* 33*  CREATININE 1.34* 1.39* 1.34*  CALCIUM 8.7* 8.9 9.0   GFR: Estimated Creatinine Clearance: 60.4 mL/min (A) (by C-G formula based on SCr of 1.34 mg/dL (H)).  Liver Function Tests: Recent Labs  Lab 11/24/18 0615 11/25/18 0450 11/26/18 0600 11/27/18 0518  AST 17 16 16 17   ALT 36 34 31 28  ALKPHOS 63 60 62 60  BILITOT 0.9 0.9 0.8 0.8  PROT 5.7* 5.3* 5.3* 5.5*  ALBUMIN 3.3* 3.1* 3.1* 3.2*    HbA1C: Hgb A1c MFr Bld  Date/Time Value Ref Range Status  11/08/2018 05:00 AM 7.3 (H) 4.8 -  5.6 % Final    Comment:    (NOTE) Pre diabetes:          5.7%-6.4% Diabetes:              >6.4% Glycemic control for   <7.0% adults with diabetes   11/07/2018 05:00 AM 7.2 (H) 4.8 - 5.6 % Final    Comment:    (NOTE)         Prediabetes: 5.7 - 6.4         Diabetes: >6.4         Glycemic control for adults with diabetes: <7.0     CBG: Recent Labs  Lab 11/28/18 1212 11/28/18 1709 11/28/18 2139 11/29/18 0749 11/29/18 1157  GLUCAP 237* 287* 221* 149* 241*    Recent Results (from the past 240 hour(s))   Novel Coronavirus, NAA (hospital order; send-out to ref lab)     Status: None   Collection Time: 11/23/18 10:38 AM   Specimen: Nasopharyngeal Swab; Respiratory  Result Value Ref Range Status   SARS-CoV-2, NAA NOT DETECTED NOT DETECTED Final    Comment: (NOTE) This test was developed and its performance characteristics determined by [employer]. This test has not been FDA cleared or approved. This test has been authorized by FDA under an Emergency Use Authorization (EUA). This test is only authorized for the duration of time the declaration that circumstances exist justifying the authorization of the emergency use of in vitro diagnostic tests for detection of SARS-CoV-2 virus and/or diagnosis of COVID-19 infection under section 564(b)(1) of the Act, 21 U.S.C. 458KDX-8(P)(3), unless the authorization is terminated or revoked sooner. When diagnostic testing is negative, the possibility of a false negative result should be considered in the context of a patient's recent exposures and the presence of clinical signs and symptoms consistent with COVID-19. An individual without symptoms of COVID-19 and who is not shedding SARS-CoV-2 virus would expect to have a negative (not detected) result in this assay. Performed  At: [facility] [place], [place] [id number] Rush Farmer MD BH:[id number]    [place]  Final    Comment: Performed at [facility] [address], [place], [place] [address]  Novel Coronavirus, NAA (hospital order; send-out to ref lab)     Status: None   Collection Time: 11/26/18 11:32 AM   Specimen: Nasopharyngeal Swab; Respiratory  Result Value Ref Range Status   SARS-CoV-2, NAA NOT DETECTED NOT DETECTED Final    Comment: (NOTE) This test was developed and its performance characteristics determined by [employer]. This test has not been FDA cleared or approved. This test has been  authorized by FDA under an Emergency Use Authorization (EUA). This test is only authorized for the duration of time the declaration that circumstances exist justifying the authorization of the emergency use of in vitro diagnostic tests for detection of SARS-CoV-2 virus and/or diagnosis of COVID-19 infection under section 564(b)(1) of the Act, 21 U.S.C. 299MEQ-6(S)(3), unless the authorization is terminated or revoked sooner. When diagnostic testing is negative, the possibility of a false negative result should be considered in the context of a patient's recent exposures and the presence of clinical signs and symptoms consistent with COVID-19. An individual without symptoms of COVID-19 and who is not shedding SARS-CoV-2 virus would expect to have a negative (not detected) result in this assay. Performed  At: [facility] [address] [place], [place] [id number] Rush Farmer MD LG:[id number]    [place]  Final  Comment: Performed at [facility], [place] [address] [place], [place] [address]     Scheduled Meds: . aspirin  81 mg Oral QHS  . brimonidine  1 drop Both Eyes BID  . Chlorhexidine Gluconate Cloth  6 each Topical Daily  . cilostazol  100 mg Oral BID  . enoxaparin (LOVENOX) injection  60 mg Subcutaneous Q24H  . famotidine  20 mg Oral QHS  . folic acid  1 mg Oral Daily  . furosemide  40 mg Oral Daily  . gabapentin  600 mg Oral TID  . insulin aspart  0-20 Units Subcutaneous TID WC  . insulin aspart protamine- aspart  18 Units Subcutaneous Q supper  . insulin aspart protamine- aspart  34 Units Subcutaneous Q breakfast  . Ipratropium-Albuterol  2 puff Inhalation QID  . latanoprost  1 drop Both Eyes QHS  . mouth rinse  15 mL Mouth Rinse BID  . metoprolol tartrate  25 mg Oral BID  . oxybutynin  5 mg Oral BID  . pantoprazole  40 mg Oral Daily  . Ensure Max Protein  11 oz Oral BID  . senna-docusate  2 tablet Oral BID  .  sodium chloride flush  10-40 mL Intracatheter Q12H  . sodium chloride flush  3 mL Intravenous Q12H  . tamsulosin  0.4 mg Oral Daily  . thiamine  100 mg Oral Daily  . timolol  1 drop Both Eyes BID  . vitamin C  500 mg Oral Daily  . zinc sulfate  220 mg Oral Daily     LOS: 24 days   Cherene Altes, MD [facility] Office  [phone] Pager - Text Page per Amion  If 7PM-7AM, please contact night-coverage per Amion 11/29/2018, 2:07 PM

## 2018-11-29 NOTE — Progress Notes (Signed)
Spoke to Pt's daughter earlier today, advised that pt the was stable, and no changes today, other than the fact the he did not sleep well last night, and has requested to sleep to day and deferred PT /  I advised her that I was going to get him OOB and into the chair today for dinner.

## 2018-11-29 NOTE — Progress Notes (Signed)
Pt did not sleep well last night, he was tired today, and deferred PT.  However, he did get up prior to dinner and sat in chair through dinner time.

## 2018-11-29 NOTE — Plan of Care (Signed)
  Problem: Education: Goal: Knowledge of risk factors and measures for prevention of condition will improve Outcome: Progressing   Problem: Coping: Goal: Psychosocial and spiritual needs will be supported Outcome: Progressing   Problem: Respiratory: Goal: Will maintain a patent airway Outcome: Progressing Goal: Complications related to the disease process, condition or treatment will be avoided or minimized Outcome: Progressing   Problem: Education: Goal: Knowledge of General Education information will improve Description: Including pain rating scale, medication(s)/side effects and non-pharmacologic comfort measures Outcome: Progressing   Problem: Health Behavior/Discharge Planning: Goal: Ability to manage health-related needs will improve Outcome: Progressing   Problem: Clinical Measurements: Goal: Ability to maintain clinical measurements within normal limits will improve Outcome: Progressing Goal: Will remain free from infection Outcome: Progressing Goal: Diagnostic test results will improve Outcome: Progressing Goal: Respiratory complications will improve Outcome: Progressing Goal: Cardiovascular complication will be avoided Outcome: Progressing   Problem: Activity: Goal: Risk for activity intolerance will decrease Outcome: Progressing   Problem: Nutrition: Goal: Adequate nutrition will be maintained Outcome: Progressing   Problem: Coping: Goal: Level of anxiety will decrease Outcome: Progressing   Problem: Elimination: Goal: Will not experience complications related to urinary retention Outcome: Progressing   Problem: Pain Managment: Goal: General experience of comfort will improve Outcome: Progressing   Problem: Safety: Goal: Ability to remain free from injury will improve Outcome: Progressing   Problem: Skin Integrity: Goal: Risk for impaired skin integrity will decrease Outcome: Progressing   Problem: Education: Goal: Knowledge of risk factors  and measures for prevention of condition will improve Outcome: Progressing   Problem: Coping: Goal: Psychosocial and spiritual needs will be supported Outcome: Progressing   Problem: Respiratory: Goal: Will maintain a patent airway Outcome: Progressing Goal: Complications related to the disease process, condition or treatment will be avoided or minimized Outcome: Progressing

## 2018-11-29 NOTE — Progress Notes (Signed)
Spoke with patients daughter [name] and dicussed patient current clinical status and general  wellbeing. We discussed patients pending [facility]. She is agreeable with plan of care. She denied any other questions or concerns

## 2018-11-30 LAB — GLUCOSE, CAPILLARY
Glucose-Capillary: 157 mg/dL — ABNORMAL HIGH (ref 70–99)
Glucose-Capillary: 244 mg/dL — ABNORMAL HIGH (ref 70–99)
Glucose-Capillary: 234 mg/dL — ABNORMAL HIGH (ref 70–99)
Glucose-Capillary: 151 mg/dL — ABNORMAL HIGH (ref 70–99)

## 2018-11-30 NOTE — Progress Notes (Signed)
[place] TEAM 1 - Stepdown/ICU TEAM  RUDOLF BLIZARD  JAS:[id number] DOB: July 02, 1940 DOA: 11/03/2018 PCP: Velna Hatchet, MD    Brief Narrative:  78 year old with a history of DM 2, HTN, BPH, PAD, and OSA who presented with shortness of breath and was found to be in acute hypoxic respiratory failure with COVID pneumonia.  His hospital course has been complicated by progressive hypoxemia requiring transfer to the ICU.  He has been treated with Actemra, Remdesivir, steroids, and convalescent plasma.  Significant Events: 7/6 admit to [facility] via [place] [facility]  7/10 transfer to ICU 7/17 transfer to [facility]   COVID-19 specific Treatment: Steroid 7/8 > 8/1 Remdesivir 7/10 > 7/14 Actemra 7/10, 7/11 Convalescent plasma 7/11  Subjective: Patient was seen for a brief follow-up visit today.  He was resting comfortably in bed with no respiratory distress.  He had no complaints.  He is looking forward to progressing to the inpatient rehab unit, hopefully tomorrow.  Assessment & Plan:  COVID pneumonia - acute hypoxic respiratory failure Has completed treatment with Remdesivir, Actemra, and convalescent plasma - continue nasal cannula oxygen support - wean oxygen as able - mobilize -waiting on bed at CIR  Mild epistaxis Resolved   Hyperkalemia Resolved   Chronic diastolic heart failure Continue maintenance/home dose diuretic -no gross overload on exam  Filed Weights   11/26/18 0500 11/27/18 0500 11/28/18 0508  Weight: 115.3 kg 115.1 kg 114.5 kg    DM 2 with neuropathy and hyperglycemia A1c 7.3 - now off steroids -follow CBG trend without change today  CKD stage III Creatinine stable  HTN Blood pressure controlled   CAD Asymptomatic  PAD Asymptomatic  BPH Asymptomatic  OSA  Inclusion body myositis Followed by Neurology at [facility] (records reviewed in [place] - no mention of specific med tx/steroids)  Deconditioning Continue PT/OT  Stage II sacral decubitus    DVT prophylaxis: Lovenox Code Status: FULL CODE Family Communication:  Disposition Plan: Awaiting available bed and CIR  Consultants:  none  Antimicrobials:  None presently    Objective: Blood pressure 108/65, pulse 95, temperature 97.9 F (36.6 C), temperature source Oral, resp. rate (!) 22, height 5\' 11"  (1.803 m), weight 114.5 kg, SpO2 90 %.  Intake/Output Summary (Last 24 hours) at 11/30/2018 1443 Last data filed at 11/30/2018 1021 Gross per 24 hour  Intake 370 ml  Output 700 ml  Net -330 ml   Filed Weights   11/26/18 0500 11/27/18 0500 11/28/18 0508  Weight: 115.3 kg 115.1 kg 114.5 kg    Examination: Alert and oriented x4 -no acute respiratory distress  CBC: Recent Labs  Lab 11/25/18 0450 11/26/18 0600 11/27/18 0518  WBC 7.4 6.3 6.7  HGB 14.0 13.2 13.6  HCT 40.2 38.0* 40.1  MCV 90.1 90.3 90.9  PLT 126* 118* 419*   Basic Metabolic Panel: Recent Labs  Lab 11/25/18 0450 11/26/18 0600 11/27/18 0518  NA 131* 132* 134*  K 4.4 4.7 4.5  CL 94* 95* 96*  CO2 28 29 31   GLUCOSE 273* 297* 185*  BUN 35* 35* 33*  CREATININE 1.34* 1.39* 1.34*  CALCIUM 8.7* 8.9 9.0   GFR: Estimated Creatinine Clearance: 60.4 mL/min (A) (by C-G formula based on SCr of 1.34 mg/dL (H)).  Liver Function Tests: Recent Labs  Lab 11/24/18 0615 11/25/18 0450 11/26/18 0600 11/27/18 0518  AST 17 16 16 17   ALT 36 34 31 28  ALKPHOS 63 60 62 60  BILITOT 0.9 0.9 0.8 0.8  PROT 5.7* 5.3* 5.3* 5.5*  ALBUMIN 3.3* 3.1* 3.1* 3.2*    HbA1C: Hgb A1c MFr Bld  Date/Time Value Ref Range Status  11/08/2018 05:00 AM 7.3 (H) 4.8 - 5.6 % Final    Comment:    (NOTE) Pre diabetes:          5.7%-6.4% Diabetes:              >6.4% Glycemic control for   <7.0% adults with diabetes   11/07/2018 05:00 AM 7.2 (H) 4.8 - 5.6 % Final    Comment:    (NOTE)         Prediabetes: 5.7 - 6.4         Diabetes: >6.4         Glycemic control for adults with diabetes: <7.0     CBG: Recent Labs  Lab  11/29/18 1157 11/29/18 1701 11/29/18 2117 11/30/18 0758 11/30/18 1149  GLUCAP 241* 250* 229* 157* 244*    Recent Results (from the past 240 hour(s))  Novel Coronavirus, NAA (hospital order; send-out to ref lab)     Status: None   Collection Time: 11/23/18 10:38 AM   Specimen: Nasopharyngeal Swab; Respiratory  Result Value Ref Range Status   SARS-CoV-2, NAA NOT DETECTED NOT DETECTED Final    Comment: (NOTE) This test was developed and its performance characteristics determined by [employer]. This test has not been FDA cleared or approved. This test has been authorized by FDA under an Emergency Use Authorization (EUA). This test is only authorized for the duration of time the declaration that circumstances exist justifying the authorization of the emergency use of in vitro diagnostic tests for detection of SARS-CoV-2 virus and/or diagnosis of COVID-19 infection under section 564(b)(1) of the Act, 21 U.S.C. 220URK-2(H)(0), unless the authorization is terminated or revoked sooner. When diagnostic testing is negative, the possibility of a false negative result should be considered in the context of a patient's recent exposures and the presence of clinical signs and symptoms consistent with COVID-19. An individual without symptoms of COVID-19 and who is not shedding SARS-CoV-2 virus would expect to have a negative (not detected) result in this assay. Performed  At: [facility] [place], [place] [id number] Rush Farmer MD DV:[id number]    [place]  Final    Comment: Performed at [facility] [address], [place], [place] [address]  Novel Coronavirus, NAA (hospital order; send-out to ref lab)     Status: None   Collection Time: 11/26/18 11:32 AM   Specimen: Nasopharyngeal Swab; Respiratory  Result Value Ref Range Status   SARS-CoV-2, NAA NOT DETECTED NOT DETECTED Final    Comment: (NOTE) This test  was developed and its performance characteristics determined by [employer]. This test has not been FDA cleared or approved. This test has been authorized by FDA under an Emergency Use Authorization (EUA). This test is only authorized for the duration of time the declaration that circumstances exist justifying the authorization of the emergency use of in vitro diagnostic tests for detection of SARS-CoV-2 virus and/or diagnosis of COVID-19 infection under section 564(b)(1) of the Act, 21 U.S.C. 854OEV-0(J)(5), unless the authorization is terminated or revoked sooner. When diagnostic testing is negative, the possibility of a false negative result should be considered in the context of a patient's recent exposures and the presence of clinical signs and symptoms consistent with COVID-19. An individual without symptoms of COVID-19 and who is not shedding SARS-CoV-2 virus would expect to have a negative (not detected) result in  this assay. Performed  At: [facility] [place], [place] [id number] Rush Farmer MD SJ:[id number]    [place]  Final    Comment: Performed at [facility] [address] [place], [place] [address]     Scheduled Meds: . aspirin  81 mg Oral QHS  . brimonidine  1 drop Both Eyes BID  . Chlorhexidine Gluconate Cloth  6 each Topical Daily  . cilostazol  100 mg Oral BID  . enoxaparin (LOVENOX) injection  60 mg Subcutaneous Q24H  . famotidine  20 mg Oral QHS  . folic acid  1 mg Oral Daily  . furosemide  40 mg Oral Daily  . gabapentin  600 mg Oral TID  . insulin aspart  0-20 Units Subcutaneous TID WC  . insulin aspart protamine- aspart  18 Units Subcutaneous Q supper  . insulin aspart protamine- aspart  34 Units Subcutaneous Q breakfast  . Ipratropium-Albuterol  2 puff Inhalation QID  . latanoprost  1 drop Both Eyes QHS  . mouth rinse  15 mL Mouth Rinse BID  . metoprolol tartrate  25 mg  Oral BID  . oxybutynin  5 mg Oral BID  . pantoprazole  40 mg Oral Daily  . Ensure Max Protein  11 oz Oral BID  . senna-docusate  2 tablet Oral BID  . sodium chloride flush  10-40 mL Intracatheter Q12H  . sodium chloride flush  3 mL Intravenous Q12H  . tamsulosin  0.4 mg Oral Daily  . thiamine  100 mg Oral Daily  . timolol  1 drop Both Eyes BID  . vitamin C  500 mg Oral Daily  . zinc sulfate  220 mg Oral Daily     LOS: 25 days   Cherene Altes, MD [facility] Office  [phone] Pager - Text Page per Amion  If 7PM-7AM, please contact night-coverage per Amion 11/30/2018, 2:43 PM

## 2018-11-30 NOTE — Progress Notes (Signed)
Phoned Pt's daughter updated her on OT' working with pt today and Pt up in chair, and we are still looking at him going to CIR tomorrow.. Will talk to her tomorrow with further updates as they come along

## 2018-11-30 NOTE — Progress Notes (Signed)
Patient refusing AM bath at this time. States he feels too cold to take a bath. RN offered warm blanket, he declined

## 2018-11-30 NOTE — Progress Notes (Signed)
Occupational Therapy Treatment Patient Details Name: Andrew Larsen MRN: [id number] DOB: 1941-01-24 Today's Date: 11/30/2018    History of present illness Pt is a 78 y.o. male admitted 11/03/18 with SOB and cough; found to have COVID-19. Hospital course complicated by worsening hypoxemia requirng transfer to ICU. 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   OT comments  Pt progressing towards established OT goals and continues to present with high motivation to participate in therapy despite fatigue. Pt performing stand pivot to [facility] and then toileting with Min Guard A and RW. Pt performing functional mobility in room with Min Guard A and RW on 4L O2. SpO2 fluctuating between 96%-82% (once dropping to 79%); cued for purse lip breathing and pt able to recover with seated rest break. Continue to recommend dc to CIR for intensive OT. Will continue to follow acutely as admitted.    Follow Up Recommendations  CIR;Supervision/Assistance - 24 hour    Equipment Recommendations  3 in 1 bedside commode;Tub/shower seat    Recommendations for Other Services PT consult;Rehab consult    Precautions / Restrictions Precautions Precautions: Fall Precaution Comments: R > L leg weakness. must monitor VS Restrictions Weight Bearing Restrictions: No       Mobility Bed Mobility Overal bed mobility: Needs Assistance Bed Mobility: Rolling;Sidelying to Sit     Supine to sit: Min assist;Min guard;HOB elevated     General bed mobility comments: Pt close to EOB and required Min A to pull trunk into sitting for safety  Transfers Overall transfer level: Needs assistance Equipment used: Rolling walker (2 wheeled) Transfers: Sit to/from [facility] Sit to Stand: Min guard Stand pivot transfers: Min guard       General transfer comment: Min Guard A for safety     Balance Overall balance assessment: History of Falls;Needs assistance Sitting-balance support: Feet supported Sitting balance-Leahy Scale: Good Sitting balance - Comments: EOB and in recliner   Standing balance support: Bilateral upper extremity supported;During functional activity Standing balance-Leahy Scale: Poor Standing balance comment: Reliant on UE support                           ADL either performed or assessed with clinical judgement   ADL Overall ADL's : Needs assistance/impaired     Grooming: Wash/dry hands;Set up;Sitting Grooming Details (indicate cue type and reason): Providing pt with hand sanitizer                 Toilet Transfer: Min Dentist Details (indicate cue type and reason): Pt performing stand pivot to [facility] with Min Guard A for safety.  Toileting- Clothing Manipulation and Hygiene: Supervision/safety;Set up;Sitting/lateral lean Toileting - Clothing Manipulation Details (indicate cue type and reason): Pt performing peri care while sitting on BSC and laterally leaning.      Functional mobility during ADLs: Min guard;Rolling walker;Cueing for safety;Minimal assistance(Short distance in room) General ADL Comments: Pt performing toileting at [facility] and then short distance functional mobility. Pt demonstrating high motivated despite fatigue and increased activity tolerance. However, continue to require rest breaks inbetween activities. SpO2 flucuating between 96%-83% while on 4L. Once SpO2 dropping to 79% but pt able to recover with seated rest break.      Vision       Perception     Praxis      Cognition Arousal/Alertness: Awake/alert Behavior During Therapy:  WFL for tasks assessed/performed Overall Cognitive Status: Within Functional Limits for tasks assessed                                 General Comments: Pt making jokes this session. Highly motivated to participate in therapy         Exercises     Shoulder Instructions       General Comments At rest, pt SpO2 99-95% on 4L O2. Pt flucuating between 96%-82% on 4L during activity with once moment dropping to 79%    Pertinent Vitals/ Pain       Pain Assessment: Faces Faces Pain Scale: Hurts whole lot Pain Location: bed sore on buttock Pain Descriptors / Indicators: Discomfort;Grimacing  Home Living                                          Prior Functioning/Environment              Frequency  Min 3X/week        Progress Toward Goals  OT Goals(current goals can now be found in the care plan section)  Progress towards OT goals: Progressing toward goals  Acute Rehab OT Goals Patient Stated Goal: Return home to family OT Goal Formulation: With patient Time For Goal Achievement: 12/02/18 Potential to Achieve Goals: Good ADL Goals Pt Will Perform Grooming: with min guard assist;standing Pt Will Perform Lower Body Dressing: with min guard assist;with adaptive equipment;sit to/from stand Pt Will Transfer to Toilet: with min guard assist;bedside commode;ambulating Pt Will Perform Toileting - Clothing Manipulation and hygiene: with min guard assist;sit to/from stand;sitting/lateral leans Pt Will Perform Tub/Shower Transfer: with min assist;shower seat;ambulating;Tub transfer  Plan Discharge plan remains appropriate    Co-evaluation                 AM-PAC OT "6 Clicks" Daily Activity     Outcome Measure   Help from another person eating meals?: None Help from another person taking care of personal grooming?: A Little Help from another person toileting, which includes using toliet, bedpan, or urinal?: A Little Help from another person bathing (including washing, rinsing, drying)?: A Lot Help from another person to put on and taking off regular upper body clothing?: A Little Help from another person to put on and taking off regular lower body clothing?: A Lot 6 Click Score:  17    End of Session Equipment Utilized During Treatment: Oxygen;Rolling walker;Gait belt(4L)  OT Visit Diagnosis: Unsteadiness on feet (R26.81);Other abnormalities of gait and mobility (R26.89);Muscle weakness (generalized) (M62.81)   Activity Tolerance Patient tolerated treatment well   Patient Left in chair;with call bell/phone within reach   Nurse Communication Mobility status        Time: 1610-9604 OT Time Calculation (min): 41 min  Charges: OT General Charges $OT Visit: 1 Visit OT Treatments $Self Care/Home Management : 38-52 mins  [place], OTR/L Acute Rehab Pager: [phone] Office: [place] 11/30/2018, 1:01 PM

## 2018-11-30 NOTE — Plan of Care (Signed)
  Problem: Education: Goal: Knowledge of risk factors and measures for prevention of condition will improve Outcome: Progressing   Problem: Coping: Goal: Psychosocial and spiritual needs will be supported Outcome: Progressing   Problem: Respiratory: Goal: Will maintain a patent airway Outcome: Progressing Goal: Complications related to the disease process, condition or treatment will be avoided or minimized Outcome: Progressing   Problem: Education: Goal: Knowledge of General Education information will improve Description: Including pain rating scale, medication(s)/side effects and non-pharmacologic comfort measures Outcome: Progressing   Problem: Health Behavior/Discharge Planning: Goal: Ability to manage health-related needs will improve Outcome: Progressing   Problem: Clinical Measurements: Goal: Ability to maintain clinical measurements within normal limits will improve Outcome: Progressing Goal: Will remain free from infection Outcome: Progressing Goal: Diagnostic test results will improve Outcome: Progressing Goal: Respiratory complications will improve Outcome: Progressing Goal: Cardiovascular complication will be avoided Outcome: Progressing   Problem: Activity: Goal: Risk for activity intolerance will decrease Outcome: Progressing   Problem: Nutrition: Goal: Adequate nutrition will be maintained Outcome: Progressing   Problem: Coping: Goal: Level of anxiety will decrease Outcome: Progressing   Problem: Elimination: Goal: Will not experience complications related to urinary retention Outcome: Progressing   Problem: Pain Managment: Goal: General experience of comfort will improve Outcome: Progressing   Problem: Safety: Goal: Ability to remain free from injury will improve Outcome: Progressing   Problem: Skin Integrity: Goal: Risk for impaired skin integrity will decrease Outcome: Progressing   Problem: Education: Goal: Knowledge of risk factors  and measures for prevention of condition will improve Outcome: Progressing   Problem: Coping: Goal: Psychosocial and spiritual needs will be supported Outcome: Progressing   Problem: Respiratory: Goal: Will maintain a patent airway Outcome: Progressing Goal: Complications related to the disease process, condition or treatment will be avoided or minimized Outcome: Progressing

## 2018-12-01 ENCOUNTER — Encounter (HOSPITAL_COMMUNITY): Payer: Self-pay

## 2018-12-01 ENCOUNTER — Other Ambulatory Visit: Payer: Self-pay

## 2018-12-01 ENCOUNTER — Inpatient Hospital Stay (HOSPITAL_COMMUNITY)
Admission: RE | Admit: 2018-12-01 | Discharge: 2018-12-16 | DRG: 945 | Disposition: A | Discharge status: Home Health | Payer: Medicare HMO | Source: Other Acute Inpatient Hospital | Attending: Physical Medicine & Rehabilitation | Admitting: Physical Medicine & Rehabilitation

## 2018-12-01 DIAGNOSIS — J9601 Acute respiratory failure with hypoxia: Secondary | ICD-10-CM

## 2018-12-01 DIAGNOSIS — R7309 Other abnormal glucose: Secondary | ICD-10-CM | POA: Diagnosis not present

## 2018-12-01 DIAGNOSIS — E871 Hypo-osmolality and hyponatremia: Secondary | ICD-10-CM | POA: Diagnosis present

## 2018-12-01 DIAGNOSIS — G7241 Inclusion body myositis [IBM]: Secondary | ICD-10-CM | POA: Diagnosis present

## 2018-12-01 DIAGNOSIS — B948 Sequelae of other specified infectious and parasitic diseases: Secondary | ICD-10-CM

## 2018-12-01 DIAGNOSIS — Z8042 Family history of malignant neoplasm of prostate: Secondary | ICD-10-CM

## 2018-12-01 DIAGNOSIS — I129 Hypertensive chronic kidney disease with stage 1 through stage 4 chronic kidney disease, or unspecified chronic kidney disease: Secondary | ICD-10-CM | POA: Diagnosis present

## 2018-12-01 DIAGNOSIS — E7800 Pure hypercholesterolemia, unspecified: Secondary | ICD-10-CM | POA: Diagnosis present

## 2018-12-01 DIAGNOSIS — E785 Hyperlipidemia, unspecified: Secondary | ICD-10-CM | POA: Diagnosis present

## 2018-12-01 DIAGNOSIS — Z9981 Dependence on supplemental oxygen: Secondary | ICD-10-CM

## 2018-12-01 DIAGNOSIS — G4733 Obstructive sleep apnea (adult) (pediatric): Secondary | ICD-10-CM | POA: Diagnosis not present

## 2018-12-01 DIAGNOSIS — E46 Unspecified protein-calorie malnutrition: Secondary | ICD-10-CM

## 2018-12-01 DIAGNOSIS — Z833 Family history of diabetes mellitus: Secondary | ICD-10-CM | POA: Diagnosis not present

## 2018-12-01 DIAGNOSIS — K219 Gastro-esophageal reflux disease without esophagitis: Secondary | ICD-10-CM | POA: Diagnosis present

## 2018-12-01 DIAGNOSIS — U071 COVID-19: Secondary | ICD-10-CM

## 2018-12-01 DIAGNOSIS — Z87891 Personal history of nicotine dependence: Secondary | ICD-10-CM

## 2018-12-01 DIAGNOSIS — D72819 Decreased white blood cell count, unspecified: Secondary | ICD-10-CM | POA: Diagnosis not present

## 2018-12-01 DIAGNOSIS — E669 Obesity, unspecified: Secondary | ICD-10-CM | POA: Diagnosis not present

## 2018-12-01 DIAGNOSIS — Z7982 Long term (current) use of aspirin: Secondary | ICD-10-CM

## 2018-12-01 DIAGNOSIS — Z794 Long term (current) use of insulin: Secondary | ICD-10-CM | POA: Diagnosis not present

## 2018-12-01 DIAGNOSIS — D696 Thrombocytopenia, unspecified: Secondary | ICD-10-CM | POA: Diagnosis not present

## 2018-12-01 DIAGNOSIS — I2510 Atherosclerotic heart disease of native coronary artery without angina pectoris: Secondary | ICD-10-CM | POA: Diagnosis present

## 2018-12-01 DIAGNOSIS — N183 Chronic kidney disease, stage 3 unspecified: Secondary | ICD-10-CM

## 2018-12-01 DIAGNOSIS — R000 Tachycardia, unspecified: Secondary | ICD-10-CM

## 2018-12-01 DIAGNOSIS — Z888 Allergy status to other drugs, medicaments and biological substances status: Secondary | ICD-10-CM

## 2018-12-01 DIAGNOSIS — I10 Essential (primary) hypertension: Secondary | ICD-10-CM

## 2018-12-01 DIAGNOSIS — N1831 Chronic kidney disease, stage 3a: Secondary | ICD-10-CM

## 2018-12-01 DIAGNOSIS — J988 Other specified respiratory disorders: Secondary | ICD-10-CM | POA: Diagnosis not present

## 2018-12-01 DIAGNOSIS — R5381 Other malaise: Secondary | ICD-10-CM | POA: Diagnosis not present

## 2018-12-01 DIAGNOSIS — E1151 Type 2 diabetes mellitus with diabetic peripheral angiopathy without gangrene: Secondary | ICD-10-CM | POA: Diagnosis present

## 2018-12-01 DIAGNOSIS — R0602 Shortness of breath: Secondary | ICD-10-CM | POA: Diagnosis not present

## 2018-12-01 DIAGNOSIS — E8809 Other disorders of plasma-protein metabolism, not elsewhere classified: Secondary | ICD-10-CM

## 2018-12-01 DIAGNOSIS — R350 Frequency of micturition: Secondary | ICD-10-CM | POA: Diagnosis not present

## 2018-12-01 DIAGNOSIS — E1165 Type 2 diabetes mellitus with hyperglycemia: Secondary | ICD-10-CM | POA: Diagnosis present

## 2018-12-01 DIAGNOSIS — Z8249 Family history of ischemic heart disease and other diseases of the circulatory system: Secondary | ICD-10-CM

## 2018-12-01 DIAGNOSIS — L89159 Pressure ulcer of sacral region, unspecified stage: Secondary | ICD-10-CM | POA: Diagnosis present

## 2018-12-01 DIAGNOSIS — R05 Cough: Secondary | ICD-10-CM | POA: Diagnosis not present

## 2018-12-01 DIAGNOSIS — Z79899 Other long term (current) drug therapy: Secondary | ICD-10-CM | POA: Diagnosis not present

## 2018-12-01 DIAGNOSIS — J9611 Chronic respiratory failure with hypoxia: Secondary | ICD-10-CM | POA: Diagnosis present

## 2018-12-01 DIAGNOSIS — H409 Unspecified glaucoma: Secondary | ICD-10-CM | POA: Diagnosis present

## 2018-12-01 DIAGNOSIS — E1122 Type 2 diabetes mellitus with diabetic chronic kidney disease: Secondary | ICD-10-CM | POA: Diagnosis not present

## 2018-12-01 DIAGNOSIS — E1169 Type 2 diabetes mellitus with other specified complication: Secondary | ICD-10-CM | POA: Diagnosis not present

## 2018-12-01 LAB — GLUCOSE, CAPILLARY
Glucose-Capillary: 190 mg/dL — ABNORMAL HIGH (ref 70–99)
Glucose-Capillary: 298 mg/dL — ABNORMAL HIGH (ref 70–99)
Glucose-Capillary: 197 mg/dL — ABNORMAL HIGH (ref 70–99)
Glucose-Capillary: 189 mg/dL — ABNORMAL HIGH (ref 70–99)

## 2018-12-01 MED ORDER — GABAPENTIN 300 MG PO CAPS
600.0000 mg | ORAL_CAPSULE | Freq: Three times a day (TID) | ORAL | Status: DC
  Administered 2018-12-01 – 2018-12-16 (×46): 600 mg via ORAL
  Filled 2018-12-01 (×46): qty 2

## 2018-12-01 MED ORDER — SENNOSIDES-DOCUSATE SODIUM 8.6-50 MG PO TABS
2.0000 | ORAL_TABLET | Freq: Two times a day (BID) | ORAL | Status: DC
  Administered 2018-12-01 – 2018-12-16 (×24): 2 via ORAL
  Filled 2018-12-01 (×28): qty 2

## 2018-12-01 MED ORDER — OXYBUTYNIN CHLORIDE 5 MG PO TABS
5.0000 mg | ORAL_TABLET | Freq: Two times a day (BID) | ORAL | Status: DC
  Administered 2018-12-01 – 2018-12-16 (×30): 5 mg via ORAL
  Filled 2018-12-01 (×29): qty 1

## 2018-12-01 MED ORDER — BENZONATATE 100 MG PO CAPS
200.0000 mg | ORAL_CAPSULE | Freq: Three times a day (TID) | ORAL | Status: DC | PRN
  Administered 2018-12-04 – 2018-12-08 (×7): 200 mg via ORAL
  Filled 2018-12-01 (×8): qty 2

## 2018-12-01 MED ORDER — ENOXAPARIN SODIUM 60 MG/0.6ML ~~LOC~~ SOLN
60.0000 mg | SUBCUTANEOUS | Status: DC
  Filled 2018-12-01 (×2): qty 0.6

## 2018-12-01 MED ORDER — GUAIFENESIN-DM 100-10 MG/5ML PO SYRP
5.0000 mL | ORAL_SOLUTION | Freq: Four times a day (QID) | ORAL | Status: DC | PRN
  Administered 2018-12-03 – 2018-12-07 (×6): 10 mL via ORAL
  Administered 2018-12-08: 18:00:00 5 mL via ORAL
  Administered 2018-12-09 – 2018-12-11 (×4): 10 mL via ORAL
  Administered 2018-12-12: 13:00:00 5 mL via ORAL
  Filled 2018-12-01 (×10): qty 10

## 2018-12-01 MED ORDER — BRIMONIDINE TARTRATE 0.15 % OP SOLN
1.0000 [drp] | Freq: Two times a day (BID) | OPHTHALMIC | Status: DC
  Administered 2018-12-01 – 2018-12-16 (×30): 1 [drp] via OPHTHALMIC
  Filled 2018-12-01: qty 5

## 2018-12-01 MED ORDER — ALUM & MAG HYDROXIDE-SIMETH 200-200-20 MG/5ML PO SUSP
30.0000 mL | ORAL | Status: DC | PRN
  Administered 2018-12-07: 08:00:00 30 mL via ORAL
  Filled 2018-12-01: qty 30

## 2018-12-01 MED ORDER — TAMSULOSIN HCL 0.4 MG PO CAPS
0.4000 mg | ORAL_CAPSULE | Freq: Every day | ORAL | Status: DC
  Administered 2018-12-02 – 2018-12-16 (×15): 0.4 mg via ORAL
  Filled 2018-12-01 (×15): qty 1

## 2018-12-01 MED ORDER — ACETAMINOPHEN 325 MG PO TABS: 325.0000 mg | ORAL_TABLET | ORAL | Status: DC | PRN

## 2018-12-01 MED ORDER — PROCHLORPERAZINE EDISYLATE 10 MG/2ML IJ SOLN: 5.0000 mg | Freq: Four times a day (QID) | INTRAMUSCULAR | Status: DC | PRN

## 2018-12-01 MED ORDER — ASPIRIN 81 MG PO CHEW
81.0000 mg | CHEWABLE_TABLET | Freq: Every day | ORAL | Status: DC
  Administered 2018-12-01 – 2018-12-15 (×15): 81 mg via ORAL
  Filled 2018-12-01 (×15): qty 1

## 2018-12-01 MED ORDER — VITAMIN B-1 100 MG PO TABS
100.0000 mg | ORAL_TABLET | Freq: Every day | ORAL | Status: DC
  Administered 2018-12-02 – 2018-12-16 (×15): 100 mg via ORAL
  Filled 2018-12-01 (×15): qty 1

## 2018-12-01 MED ORDER — ENSURE MAX PROTEIN PO LIQD
11.0000 [oz_av] | Freq: Two times a day (BID) | ORAL | Status: DC
  Administered 2018-12-01 – 2018-12-16 (×22): 11 [oz_av] via ORAL
  Filled 2018-12-01 (×32): qty 330

## 2018-12-01 MED ORDER — DIPHENHYDRAMINE HCL 12.5 MG/5ML PO ELIX: 12.5000 mg | ORAL_SOLUTION | Freq: Four times a day (QID) | ORAL | Status: DC | PRN

## 2018-12-01 MED ORDER — ZINC SULFATE 220 (50 ZN) MG PO CAPS
220.0000 mg | ORAL_CAPSULE | Freq: Every day | ORAL | Status: DC
  Administered 2018-12-02 – 2018-12-16 (×15): 220 mg via ORAL
  Filled 2018-12-01 (×15): qty 1

## 2018-12-01 MED ORDER — PROCHLORPERAZINE MALEATE 5 MG PO TABS: 5.0000 mg | ORAL_TABLET | Freq: Four times a day (QID) | ORAL | Status: DC | PRN

## 2018-12-01 MED ORDER — SALINE SPRAY 0.65 % NA SOLN
1.0000 | NASAL | Status: DC | PRN
  Administered 2018-12-03: 1 via NASAL
  Filled 2018-12-01: qty 44

## 2018-12-01 MED ORDER — INSULIN ASPART PROT & ASPART (70-30 MIX) 100 UNIT/ML ~~LOC~~ SUSP
34.0000 [IU] | Freq: Every day | SUBCUTANEOUS | Status: DC
  Administered 2018-12-02 – 2018-12-07 (×6): 34 [IU] via SUBCUTANEOUS
  Filled 2018-12-01: qty 10

## 2018-12-01 MED ORDER — ENOXAPARIN SODIUM 60 MG/0.6ML ~~LOC~~ SOLN: 60.0000 mg | SUBCUTANEOUS | Status: DC

## 2018-12-01 MED ORDER — POLYETHYLENE GLYCOL 3350 17 G PO PACK
17.0000 g | PACK | Freq: Every day | ORAL | Status: DC | PRN
  Administered 2018-12-09: 10:00:00 17 g via ORAL
  Filled 2018-12-01: qty 1

## 2018-12-01 MED ORDER — IPRATROPIUM-ALBUTEROL 0.5-2.5 (3) MG/3ML IN SOLN
3.0000 mL | Freq: Four times a day (QID) | RESPIRATORY_TRACT | Status: DC
  Administered 2018-12-01 – 2018-12-02 (×3): 3 mL via RESPIRATORY_TRACT
  Filled 2018-12-01 (×3): qty 3

## 2018-12-01 MED ORDER — HYDROCOD POLST-CPM POLST ER 10-8 MG/5ML PO SUER
5.0000 mL | Freq: Two times a day (BID) | ORAL | Status: DC | PRN
  Administered 2018-12-04 – 2018-12-08 (×3): 5 mL via ORAL
  Filled 2018-12-01 (×3): qty 5

## 2018-12-01 MED ORDER — INSULIN ASPART 100 UNIT/ML ~~LOC~~ SOLN
0.0000 [IU] | Freq: Three times a day (TID) | SUBCUTANEOUS | Status: DC
  Administered 2018-12-01: 4 [IU] via SUBCUTANEOUS
  Administered 2018-12-01: 14:00:00 11 [IU] via SUBCUTANEOUS
  Administered 2018-12-02 – 2018-12-04 (×7): 4 [IU] via SUBCUTANEOUS
  Administered 2018-12-04: 08:00:00 7 [IU] via SUBCUTANEOUS
  Administered 2018-12-05: 08:00:00 4 [IU] via SUBCUTANEOUS
  Administered 2018-12-05: 18:00:00 11 [IU] via SUBCUTANEOUS
  Administered 2018-12-06: 18:00:00 4 [IU] via SUBCUTANEOUS
  Administered 2018-12-06: 3 [IU] via SUBCUTANEOUS
  Administered 2018-12-06: 09:00:00 4 [IU] via SUBCUTANEOUS
  Administered 2018-12-07: 13:00:00 3 [IU] via SUBCUTANEOUS
  Administered 2018-12-07: 08:00:00 4 [IU] via SUBCUTANEOUS
  Administered 2018-12-07: 3 [IU] via SUBCUTANEOUS
  Administered 2018-12-08 – 2018-12-09 (×2): 7 [IU] via SUBCUTANEOUS
  Administered 2018-12-09: 09:00:00 3 [IU] via SUBCUTANEOUS
  Administered 2018-12-10: 4 [IU] via SUBCUTANEOUS
  Administered 2018-12-10: 15:00:00 7 [IU] via SUBCUTANEOUS
  Administered 2018-12-10 – 2018-12-11 (×2): 4 [IU] via SUBCUTANEOUS
  Administered 2018-12-11: 7 [IU] via SUBCUTANEOUS
  Administered 2018-12-12: 13:00:00 3 [IU] via SUBCUTANEOUS
  Administered 2018-12-12: 19:00:00 4 [IU] via SUBCUTANEOUS
  Administered 2018-12-12 – 2018-12-13 (×2): 3 [IU] via SUBCUTANEOUS
  Administered 2018-12-13: 13:00:00 4 [IU] via SUBCUTANEOUS
  Administered 2018-12-14: 3 [IU] via SUBCUTANEOUS
  Administered 2018-12-14: 18:00:00 4 [IU] via SUBCUTANEOUS
  Administered 2018-12-14: 07:00:00 3 [IU] via SUBCUTANEOUS
  Administered 2018-12-15: 4 [IU] via SUBCUTANEOUS
  Administered 2018-12-15 (×2): 3 [IU] via SUBCUTANEOUS
  Administered 2018-12-16 (×2): 4 [IU] via SUBCUTANEOUS

## 2018-12-01 MED ORDER — INSULIN ASPART PROT & ASPART (70-30 MIX) 100 UNIT/ML ~~LOC~~ SUSP
18.0000 [IU] | Freq: Every day | SUBCUTANEOUS | Status: DC
  Administered 2018-12-01 – 2018-12-04 (×4): 18 [IU] via SUBCUTANEOUS

## 2018-12-01 MED ORDER — METOPROLOL TARTRATE 25 MG PO TABS
25.0000 mg | ORAL_TABLET | Freq: Two times a day (BID) | ORAL | Status: DC
  Administered 2018-12-01 – 2018-12-16 (×30): 25 mg via ORAL
  Filled 2018-12-01 (×30): qty 1

## 2018-12-01 MED ORDER — FLEET ENEMA 7-19 GM/118ML RE ENEM: 1.0000 | ENEMA | Freq: Once | RECTAL | Status: DC | PRN

## 2018-12-01 MED ORDER — PROCHLORPERAZINE 25 MG RE SUPP: 12.5000 mg | Freq: Four times a day (QID) | RECTAL | Status: DC | PRN

## 2018-12-01 MED ORDER — TIMOLOL MALEATE 0.5 % OP SOLN
1.0000 [drp] | Freq: Two times a day (BID) | OPHTHALMIC | Status: DC
  Administered 2018-12-01 – 2018-12-16 (×30): 1 [drp] via OPHTHALMIC
  Filled 2018-12-01: qty 5

## 2018-12-01 MED ORDER — FOLIC ACID 1 MG PO TABS
1.0000 mg | ORAL_TABLET | Freq: Every day | ORAL | Status: DC
  Administered 2018-12-02 – 2018-12-16 (×15): 1 mg via ORAL
  Filled 2018-12-01 (×15): qty 1

## 2018-12-01 MED ORDER — FAMOTIDINE 20 MG PO TABS
20.0000 mg | ORAL_TABLET | Freq: Every day | ORAL | Status: DC
  Administered 2018-12-01 – 2018-12-15 (×15): 20 mg via ORAL
  Filled 2018-12-01 (×15): qty 1

## 2018-12-01 MED ORDER — TRAZODONE HCL 50 MG PO TABS
25.0000 mg | ORAL_TABLET | Freq: Every evening | ORAL | Status: DC | PRN
  Administered 2018-12-10: 23:00:00 50 mg via ORAL
  Filled 2018-12-01: qty 1

## 2018-12-01 MED ORDER — LATANOPROST 0.005 % OP SOLN
1.0000 [drp] | Freq: Every day | OPHTHALMIC | Status: DC
  Administered 2018-12-01 – 2018-12-15 (×14): 1 [drp] via OPHTHALMIC
  Filled 2018-12-01: qty 2.5

## 2018-12-01 MED ORDER — FUROSEMIDE 40 MG PO TABS
40.0000 mg | ORAL_TABLET | Freq: Every day | ORAL | Status: DC
  Administered 2018-12-02 – 2018-12-16 (×15): 40 mg via ORAL
  Filled 2018-12-01 (×6): qty 1
  Filled 2018-12-01: qty 2
  Filled 2018-12-01 (×8): qty 1

## 2018-12-01 MED ORDER — PANTOPRAZOLE SODIUM 40 MG PO TBEC
40.0000 mg | DELAYED_RELEASE_TABLET | Freq: Every day | ORAL | Status: DC
  Administered 2018-12-02 – 2018-12-16 (×15): 40 mg via ORAL
  Filled 2018-12-01 (×16): qty 1

## 2018-12-01 MED ORDER — CILOSTAZOL 100 MG PO TABS
100.0000 mg | ORAL_TABLET | Freq: Two times a day (BID) | ORAL | Status: DC
  Administered 2018-12-01 – 2018-12-16 (×30): 100 mg via ORAL
  Filled 2018-12-01 (×32): qty 1

## 2018-12-01 MED ORDER — VITAMIN C 500 MG PO TABS
500.0000 mg | ORAL_TABLET | Freq: Every day | ORAL | Status: DC
  Administered 2018-12-02 – 2018-12-16 (×15): 500 mg via ORAL
  Filled 2018-12-01 (×15): qty 1

## 2018-12-01 MED ORDER — BISACODYL 10 MG RE SUPP: 10.0000 mg | Freq: Every day | RECTAL | Status: DC | PRN

## 2018-12-01 MED ORDER — GUAIFENESIN-DM 100-10 MG/5ML PO SYRP
5.0000 mL | ORAL_SOLUTION | ORAL | Status: DC | PRN
  Administered 2018-12-06 – 2018-12-12 (×5): 5 mL via ORAL
  Filled 2018-12-01 (×7): qty 5

## 2018-12-01 NOTE — H&P (Signed)
Physical Medicine and Rehabilitation Admission H&P        Chief Complaint  Patient presents with  . Debility  . Post COVID      HPI: Andrew Larsen is a 78 year old male with history of CKD, PAD, HTN, T2DM, OSA , recent evaluation at [name] with diagnosis of inclusion body myositis; who was admitted on 11/03/18 with nonproductive cough and SOB. He had been exposed to multiple family members with COVID and found out that his outpatient test was positive.  He was admitted for observation and CXR revealed bilateral interstitial prominence due to pneumonitis v/s CHF. He continued to decline and was treated with Remdesivir,  Actemra, steroids and convalescent plasma.  He was monitored in ICU due to high oxygen up to 60% HFNC/ 35 L  --PCCM recommended goal at rest to be SAO2 > 85%.  Fluid overload treated with intermittent IV diuresis. He has improved with supportive care and has been weaned down to  4- 6 L oxygen per [name]. Therapy ongoing and patient noted to be limited by bouts of coughing, DOE/hypoxia,  BLE weakness as well as debility. CIR recommended due to functional decline.      Review of Systems  Constitutional: Negative for chills and fever.  HENT: Negative for hearing loss and tinnitus.   Eyes: Negative for blurred vision and double vision.  Respiratory: Positive for cough and shortness of breath.   Cardiovascular: Positive for leg swelling. Negative for chest pain and palpitations.  Gastrointestinal: Negative for constipation, heartburn and nausea.  Genitourinary: Negative for dysuria.  Musculoskeletal: Positive for falls.  Skin: Negative for rash.  Neurological: Positive for sensory change (BUE/BLE) and weakness. Negative for dizziness and headaches.            Past Medical History:  Diagnosis Date  . CKD (chronic kidney disease)      CKD stage III (01/2018)  . Daytime somnolence    . Diabetes mellitus without complication ([place])    . Fracture      right fibula/wears  boot cast  . GERD (gastroesophageal reflux disease)    . Glaucoma    . Hypercholesteremia    . Hyperlipidemia    . Hypertension    . Mold exposure    . PAD (peripheral artery disease) ([place])    . Sleep apnea            Past Surgical History:  Procedure Laterality Date  . LOWER EXTREMITY ANGIOGRAPHY N/A 06/12/2016    Procedure: Lower Extremity Angiography;  Surgeon: Adrian Prows, MD;  Location: [place] [facility];  Service: Cardiovascular;  Laterality: N/A;  . MUSCLE BIOPSY Right 05/06/2018    Procedure: right vastus lateralis muscle biopsy;  Surgeon: Earnie Larsson, MD;  Location: [place];  Service: Neurosurgery;  Laterality: Right;  . ORIF ANKLE FRACTURE Right 10/02/2012    Procedure: OPEN REDUCTION INTERNAL FIXATION (ORIF) ANKLE FRACTURE;  Surgeon: Meredith Pel, MD;  Location: [facility];  Service: Orthopedics;  Laterality: Right;  . PERIPHERAL VASCULAR ATHERECTOMY Left 06/12/2016    Procedure: Peripheral Vascular Atherectomy-Left Popliteal;  Surgeon: Adrian Prows, MD;  Location: [place] [facility];  Service: Cardiovascular;  Laterality: Left;  . PERIPHERAL VASCULAR INTERVENTION Left 06/12/2016    Procedure: Peripheral Vascular Intervention- DCB Left Popliteal;  Surgeon: Adrian Prows, MD;  Location: [place] [facility];  Service: Cardiovascular;  Laterality: Left;          Family History  Problem Relation Age of Onset  .  Heart disease Father    . Prostate cancer Brother    . Cancer Brother    . Diabetes Brother    . Colon cancer Neg Hx       Social History:  Was in process of being transitioned to outpatient PT prior to admission. reports that he quit smoking about 37 years ago. He has a 20.00 pack-year smoking history. He has never used smokeless tobacco. He reports that he does not drink alcohol or use drugs.           Allergies  Allergen Reactions  . Metformin And Related    . Simbrinza [Brinzolamide-Brimonidine]        Drainage, redness to eyes           Medications Prior to  Admission  Medication Sig Dispense Refill  . aspirin 81 MG chewable tablet Chew 81 mg by mouth at bedtime.       . brimonidine-timolol (COMBIGAN) 0.2-0.5 % ophthalmic solution Place 1 drop into both eyes every 12 (twelve) hours.      . calcium carbonate (CALCIUM 600) 600 MG TABS tablet Take 1,200 mg by mouth daily.       . Cholecalciferol (VITAMIN D3) 5000 units TABS Take 10,000 Units by mouth daily.       . cilostazol (PLETAL) 100 MG tablet Take 100 mg by mouth 2 (two) times daily.      . Evolocumab (REPATHA) 140 MG/ML SOSY Inject 140 mg into the skin every 14 (fourteen) days.      . famotidine (PEPCID) 20 MG tablet Take 20 mg by mouth at bedtime.      . furosemide (LASIX) 40 MG tablet Take 40 mg by mouth daily.      Marland Kitchen gabapentin (NEURONTIN) 300 MG capsule Take 600 mg by mouth 3 (three) times daily.       . insulin NPH-regular Human (NOVOLIN 70/30) (70-30) 100 UNIT/ML injection Inject 30-50 Units into the skin See admin instructions. Inject 50 units subcutaneously in am and 30 units in the evening      . latanoprost (XALATAN) 0.005 % ophthalmic solution Place 1 drop into both eyes at bedtime.      . magnesium gluconate (MAGONATE) 500 MG tablet Take 500 mg by mouth daily.      . Multiple Vitamin (MULTIVITAMIN) capsule Take 1 capsule by mouth daily.       Marland Kitchen olmesartan (BENICAR) 20 MG tablet Take 20 mg by mouth daily.      Marland Kitchen oxybutynin (DITROPAN) 5 MG tablet Take 5 mg by mouth 2 (two) times daily.      . potassium chloride SA (K-DUR,KLOR-CON) 20 MEQ tablet Take 20 mEq by mouth daily.      . tamsulosin (FLOMAX) 0.4 MG CAPS capsule Take 0.4 mg by mouth daily.      . valsartan (DIOVAN) 160 MG tablet Take 160 mg by mouth daily.         Drug Regimen Review  Drug regimen was reviewed and remains appropriate with no significant issues identified   Home: Home Living Family/patient expects to be discharged to:: Private residence Living Arrangements: (lives with daughter < [name] for about 3 years)  Available Help at Discharge: Family, Available 24 hours/day(daughter working remotely since 06/2018; Child psychotherapist f) Type of Home: House Home Access: Stairs to enter [employer] of Steps: steps through garage modified so about 2 steps per daughter Home Layout: One level Bathroom Shower/Tub: Chiropodist: Standard Bathroom Accessibility: Yes Home Equipment: Gilford Rile -  2 wheels, Cane - single point Additional Comments: was to start outpt therapy prior to admission as referred by his neurologist  Lives With: Daughter   Functional History: Prior Function Level of Independence: Independent with assistive device(s) Gait / Transfers Assistance Needed: inconisstently ambulates with cane or RW; furniture walks or holds onto walls ADL's / [place] Needed: IADLS Communication / Swallowing Assistance Needed: wfl Comments: progressive weakness over past year; seeing local and [place] neurologist for DX   Functional Status:  Mobility: Bed Mobility Overal bed mobility: Needs Assistance Bed Mobility: Supine to Sit Rolling: Min guard Sidelying to sit: Min guard Supine to sit: Min guard, HOB elevated Sit to supine: Supervision Sit to sidelying: Mod assist General bed mobility comments: Min Guard A for safety. No physical A needed Transfers Overall transfer level: Needs assistance Equipment used: Rolling walker (2 wheeled) Transfers: Sit to/from Stand, [employer] Transfers Sit to Stand: Min guard Stand pivot transfers: Min guard General transfer comment: Min Guard A for safety with used of RW. Pt with LOB and sitting back down on EOB. With second attempt, pt able to gain balance in standing.  Ambulation/Gait Ambulation/Gait assistance: Min assist, +2 safety/equipment Gait Distance (Feet): 10 Feet Assistive device: Rolling walker (2 wheeled) Gait Pattern/deviations: Step-to pattern, Decreased step length - left, Decreased step length - right General  Gait Details: Right knee hyperextends during stand, Ankle tends to invert. slight foot drop right foot. PT guarded the right knee during stance to guard against bucckling Gait velocity: Decreased Gait velocity interpretation: <1.31 ft/sec, indicative of household ambulator     ADL: ADL Overall ADL's : Needs assistance/impaired Eating/Feeding: Independent, Sitting Eating/Feeding Details (indicate cue type and reason): switched to HFNC from NRB end of session to eat lunch Grooming: Wash/dry hands, Set up, Sitting Grooming Details (indicate cue type and reason): Providing pt with hand sanitizer Upper Body Bathing: Moderate assistance, Sitting Upper Body Bathing Details (indicate cue type and reason): assist for back Lower Body Bathing: Moderate assistance, Sit to/from stand Lower Body Bathing Details (indicate cue type and reason): Pt able to wash legs and anterior peri area while seated with lateral cleaning. Mod A for cleaning posterior peri area Upper Body Dressing : Min guard, Sitting Lower Body Dressing: Maximal assistance Lower Body Dressing Details (indicate cue type and reason): Pt bending forward to adjust socks demonstrating increased activity tolerance. Toilet Transfer: Min guard, RW, Ambulation, Minimal assistance(Simulated to recliner) Armed forces technical officer Details (indicate cue type and reason): Pt performing mobility to recliner with Min Guard A and RW.  Toileting- Clothing Manipulation and Hygiene: Supervision/safety, Set up, Sitting/lateral lean Toileting - Clothing Manipulation Details (indicate cue type and reason): Pt performing peri care while sitting on BSC and laterally leaning.  Functional mobility during ADLs: Min guard, Rolling walker, Cueing for safety, Minimal assistance(Short distance in room) General ADL Comments: Pt performing fucntional mobility to recliner for breakfast. Required Min Guard-Min A for safety and balance in mobility. SpO2 dropping to 78% on 4L (monitor on  finger). Pt able to recover to 90s with seated rest break (~2 min) and purse lip breathing.   Cognition: Cognition Overall Cognitive Status: Within Functional Limits for tasks assessed Orientation Level: Oriented X4 Cognition Arousal/Alertness: Awake/alert Behavior During Therapy: WFL for tasks assessed/performed Overall Cognitive Status: Within Functional Limits for tasks assessed General Comments: Continues to be motivated to participate in therapy     Blood pressure 124/63, pulse (!) 114, temperature 97.9 F (36.6 C), temperature source Oral, resp. rate 18, height 5'  11" (1.803 m), weight 114.5 kg, SpO2 (!) 89 %. Physical Exam  Nursing note and vitals reviewed. Constitutional: He is oriented to person, place, and time. He appears well-nourished. Nasal cannula in place.  obese  HENT:  Head: Normocephalic and atraumatic.  Cardiovascular: Normal rate and regular rhythm.  No murmur heard. Respiratory: No respiratory distress. He has no wheezes.  Decreased air movement  GI: Soft. He exhibits no distension. There is no abdominal tenderness. There is no rebound.  Musculoskeletal:        General: Edema (min pedal edema BLE) present.     Comments: Multiple healed abrasions BLE.  Neurological: He is alert and oriented to person, place, and time. No cranial nerve deficit.  Normal insight and awareness. Functional memory and language. Good sitting balance. Motor 4+/5 UE. LE: 4-/5 HF, KE and 4/5 ADF/PF. No focal sensory deficits.   Skin: Skin is warm and dry.  Psychiatric: He has a normal mood and affect. His behavior is normal.      Lab Results Last 48 Hours       Results for orders placed or performed during the hospital encounter of 11/03/18 (from the past 48 hour(s))  Glucose, capillary     Status: Abnormal    Collection Time: 11/29/18 11:57 AM  Result Value Ref Range    Glucose-Capillary 241 (H) 70 - 99 mg/dL  Glucose, capillary     Status: Abnormal    Collection Time: 11/29/18   5:01 PM  Result Value Ref Range    Glucose-Capillary 250 (H) 70 - 99 mg/dL  Glucose, capillary     Status: Abnormal    Collection Time: 11/29/18  9:17 PM  Result Value Ref Range    Glucose-Capillary 229 (H) 70 - 99 mg/dL  Glucose, capillary     Status: Abnormal    Collection Time: 11/30/18  7:58 AM  Result Value Ref Range    Glucose-Capillary 157 (H) 70 - 99 mg/dL  Glucose, capillary     Status: Abnormal    Collection Time: 11/30/18 11:49 AM  Result Value Ref Range    Glucose-Capillary 244 (H) 70 - 99 mg/dL  Glucose, capillary     Status: Abnormal    Collection Time: 11/30/18  4:55 PM  Result Value Ref Range    Glucose-Capillary 234 (H) 70 - 99 mg/dL  Glucose, capillary     Status: Abnormal    Collection Time: 11/30/18  9:36 PM  Result Value Ref Range    Glucose-Capillary 151 (H) 70 - 99 mg/dL  Glucose, capillary     Status: Abnormal    Collection Time: 12/01/18  8:02 AM  Result Value Ref Range    Glucose-Capillary 190 (H) 70 - 99 mg/dL     Imaging Results (Last 48 hours)  No results found.           Medical Problem List and Plan: 1.  Functional and mobility deficits secondary to debility after ARDS, COVID             -admit to inpatient rehab 2.  Antithrombotics: -DVT/anticoagulation:  Pharmaceutical: Lovenox             -antiplatelet therapy:  3. Pain Management: tylenol prn  4. Mood: LCSW to follow for evaluation and support.              -antipsychotic agents: N/a 5. Neuropsych: This patient is capable of making decisions on his own behalf. 6. Skin/Wound Care: Will order air mattress overlay. Local care to sacral  decub.  7. Fluids/Electrolytes/Nutrition: Monitor I/O. Check lytes in am.  8. TGG:YIRSWNI BP tid--continue  9. CKD: Continue to monitor with serial checks. Cr 1.34 most recently             --will continue to monitor.  9. T2DM: Hgb A1C- 7.3. Continue  10. CAD: Will check daily weights and monitor for signs of overload. Continue Lasix and lopressor.   11. Inclusion body myositis: Has completed steroid taper.  12. OSA: Question ability to transition to CPAP 13. Thrombocytopenia: Recheck CBC in am.  14. Hyponatremia: will continue to monitor for recovery.   15. Cough/Hypoxia: Encourage IS. Continue to wean oxygen as able. On 3-4 L currently             -  Donebs QID with cough medications prn.             -wean oxygen as able   Post Admission Physician Evaluation: 1. Functional deficits secondary  to debility. 2. Patient is admitted to receive collaborative, interdisciplinary care between the physiatrist, rehab nursing staff, and therapy team. 3. Patient's level of medical complexity and substantial therapy needs in context of that medical necessity cannot be provided at a lesser intensity of care such as a SNF. 4. Patient has experienced substantial functional loss from his/her baseline which was documented above under the "Functional History" and "Functional Status" headings.  Judging by the patient's diagnosis, physical exam, and functional history, the patient has potential for functional progress which will result in measurable gains while on inpatient rehab.  These gains will be of substantial and practical use upon discharge  in facilitating mobility and self-care at the household level. 5. Physiatrist will provide 24 hour management of medical needs as well as oversight of the therapy plan/treatment and provide guidance as appropriate regarding the interaction of the two. 6. The Preadmission Screening has been reviewed and patient status is unchanged unless otherwise stated above. 7. 24 hour rehab nursing will assist with bladder management, bowel management, safety, skin/wound care, disease management, medication administration, pain management and patient education  and help integrate therapy concepts, techniques,education, etc. 8. PT will assess and treat for/with: Lower extremity strength, range of motion, stamina, balance, functional  mobility, safety, adaptive techniques and equipment, stamina.   Goals are: supervision to min assist. 9. OT will assess and treat for/with: ADL's, functional mobility, safety, upper extremity strength, adaptive techniques and equipment, community reentry.   Goals are: supervision to min assist. Therapy may proceed with showering this patient. 10. SLP will assess and treat for/with: n/a.  Goals are: n/a. 11. Case Management and Social Worker will assess and treat for psychological issues and discharge planning. 12. Team conference will be held weekly to assess progress toward goals and to determine barriers to discharge. 13. Patient will receive at least 3 hours of therapy per day at least 5 days per week. 14. ELOS: 14-20 days       15. Prognosis:  excellent   I have personally performed a face to face diagnostic evaluation of this patient and formulated the key components of the plan.  Additionally, I have personally reviewed laboratory data, imaging studies, as well as relevant notes and concur with the physician assistant's documentation above.  Meredith Staggers, MD, [facility]      Bary Leriche, PA-C 12/01/2018

## 2018-12-01 NOTE — Progress Notes (Signed)
Phoned Pt's daughter, advised that Pt has gone to CIR, left approximately 20 minutes ago, and should be there soon. I suggested that maybe when he comes home that she and he go out for walks during the evening, to get him up and out of the house... and it was ok to say that I suggested this.. she said she would do this and tell him. She thank [place] for our care we provided to her father.Marland Kitchen

## 2018-12-01 NOTE — Plan of Care (Signed)
  Problem: Education: Goal: Knowledge of risk factors and measures for prevention of condition will improve Outcome: Progressing   Problem: Coping: Goal: Psychosocial and spiritual needs will be supported Outcome: Progressing   Problem: Respiratory: Goal: Will maintain a patent airway Outcome: Progressing Goal: Complications related to the disease process, condition or treatment will be avoided or minimized Outcome: Progressing   Problem: Education: Goal: Knowledge of General Education information will improve Description: Including pain rating scale, medication(s)/side effects and non-pharmacologic comfort measures Outcome: Progressing   Problem: Health Behavior/Discharge Planning: Goal: Ability to manage health-related needs will improve Outcome: Progressing   Problem: Clinical Measurements: Goal: Ability to maintain clinical measurements within normal limits will improve Outcome: Progressing Goal: Will remain free from infection Outcome: Progressing Goal: Diagnostic test results will improve Outcome: Progressing Goal: Respiratory complications will improve Outcome: Progressing Goal: Cardiovascular complication will be avoided Outcome: Progressing   Problem: Activity: Goal: Risk for activity intolerance will decrease Outcome: Progressing   Problem: Nutrition: Goal: Adequate nutrition will be maintained Outcome: Progressing   Problem: Coping: Goal: Level of anxiety will decrease Outcome: Progressing   Problem: Elimination: Goal: Will not experience complications related to urinary retention Outcome: Progressing   Problem: Pain Managment: Goal: General experience of comfort will improve Outcome: Progressing   Problem: Safety: Goal: Ability to remain free from injury will improve Outcome: Progressing   Problem: Skin Integrity: Goal: Risk for impaired skin integrity will decrease Outcome: Progressing   Problem: Education: Goal: Knowledge of risk factors  and measures for prevention of condition will improve Outcome: Progressing   Problem: Coping: Goal: Psychosocial and spiritual needs will be supported Outcome: Progressing   Problem: Respiratory: Goal: Will maintain a patent airway Outcome: Progressing Goal: Complications related to the disease process, condition or treatment will be avoided or minimized Outcome: Progressing

## 2018-12-01 NOTE — Progress Notes (Signed)
Inpatient Rehab Admissions Coordinator:   I have approval from Dr. Thereasa Solo for pt to admit to CIR today. I will let pt/family, RN, and CM know. I have arranged transport with [facility] to pick up pt before noon.   Shann Medal, PT, DPT Admissions Coordinator [phone] 12/01/18  10:49 AM

## 2018-12-01 NOTE — Progress Notes (Signed)
Occupational Therapy Treatment Patient Details Name: Andrew Larsen MRN: [id number] DOB: 1941-01-31 Today's Date: 12/01/2018    History of present illness Pt is a 78 y.o. male admitted 11/03/18 with SOB and cough; found to have COVID-19. Hospital course complicated by worsening hypoxemia requirng transfer to ICU. 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   OT comments  Pt continues to progress towards established OT goals and is motivated to participate in therapy. Pt performing functional mobility to recliner for breakfast. Pt requiring Min Guard-Min A with RW; SpO2 dropping to 78% on 4L. Pt able to recover to 90% on 4L with seated rest break and purse lip breathing. Continue to recommend dc to CIR and will continue to follow acutely as admitted.    Follow Up Recommendations  CIR;Supervision/Assistance - 24 hour    Equipment Recommendations  3 in 1 bedside commode;Tub/shower seat    Recommendations for Other Services PT consult;Rehab consult    Precautions / Restrictions Precautions Precautions: Fall Precaution Comments: R > L leg weakness.right Hyperextends with stance, must monitor VS, trying to wean to </=4 Success Restrictions Weight Bearing Restrictions: No       Mobility Bed Mobility Overal bed mobility: Needs Assistance Bed Mobility: Supine to Sit     Supine to sit: Min guard;HOB elevated     General bed mobility comments: Min Guard A for safety. No physical A needed  Transfers Overall transfer level: Needs assistance Equipment used: Rolling walker (2 wheeled) Transfers: Sit to/from [employer] Sit to Stand: Min guard         General transfer comment: Min Guard A for safety with used of RW. Pt with LOB and sitting back down on EOB. With second attempt, pt able to gain balance in standing.     Balance Overall balance assessment:  History of Falls;Needs assistance Sitting-balance support: Feet supported Sitting balance-Leahy Scale: Good Sitting balance - Comments: EOB and in recliner   Standing balance support: Bilateral upper extremity supported;During functional activity Standing balance-Leahy Scale: Poor Standing balance comment: Reliant on UE support                           ADL either performed or assessed with clinical judgement   ADL Overall ADL's : Needs assistance/impaired                       Lower Body Dressing Details (indicate cue type and reason): Pt bending forward to adjust socks demonstrating increased activity tolerance. Toilet Transfer: Min guard;RW;Ambulation;Minimal assistance(Simulated to recliner) Armed forces technical officer Details (indicate cue type and reason): Pt performing mobility to recliner with Min Guard A and RW.          Functional mobility during ADLs: Min guard;Rolling walker;Cueing for safety;Minimal assistance(Short distance in room) General ADL Comments: Pt performing fucntional mobility to recliner for breakfast. Required Min Guard-Min A for safety and balance in mobility. SpO2 dropping to 78% on 4L (monitor on finger). Pt able to recover to 90s with seated rest break (~2 min) and purse lip breathing.     Vision       Perception     Praxis      Cognition Arousal/Alertness: Awake/alert Behavior During Therapy: WFL for tasks assessed/performed Overall Cognitive Status: Within Functional Limits for tasks assessed  General Comments: Continues to be motivated to participate in therapy        Exercises Other Exercises Other Exercises: Reviewed theraband execises. Pt verablized dunerstanding.    Shoulder Instructions       General Comments BP 134/80. SpO2 dropping to 78% on 4L O2 during activity. Recovered to 90% with rest break.     Pertinent Vitals/ Pain       Pain Assessment: Faces Faces Pain Scale:  Hurts a little bit Pain Location: bed sore on buttock Pain Descriptors / Indicators: Discomfort;Grimacing Pain Intervention(s): Monitored during session;Repositioned  Home Living                                          Prior Functioning/Environment              Frequency  Min 3X/week        Progress Toward Goals  OT Goals(current goals can now be found in the care plan section)  Progress towards OT goals: Progressing toward goals  Acute Rehab OT Goals Patient Stated Goal: Return home to family OT Goal Formulation: With patient Time For Goal Achievement: 12/02/18 Potential to Achieve Goals: Good ADL Goals Pt Will Perform Grooming: with min guard assist;standing Pt Will Perform Lower Body Dressing: with min guard assist;with adaptive equipment;sit to/from stand Pt Will Transfer to Toilet: with min guard assist;bedside commode;ambulating Pt Will Perform Toileting - Clothing Manipulation and hygiene: with min guard assist;sit to/from stand;sitting/lateral leans Pt Will Perform Tub/Shower Transfer: with min assist;shower seat;ambulating;Tub transfer  Plan Discharge plan remains appropriate    Co-evaluation                 AM-PAC OT "6 Clicks" Daily Activity     Outcome Measure   Help from another person eating meals?: None Help from another person taking care of personal grooming?: A Little Help from another person toileting, which includes using toliet, bedpan, or urinal?: A Little Help from another person bathing (including washing, rinsing, drying)?: A Lot Help from another person to put on and taking off regular upper body clothing?: A Little Help from another person to put on and taking off regular lower body clothing?: A Lot 6 Click Score: 17    End of Session Equipment Utilized During Treatment: Oxygen;Rolling walker(4L)  OT Visit Diagnosis: Unsteadiness on feet (R26.81);Other abnormalities of gait and mobility (R26.89);Muscle  weakness (generalized) (M62.81)   Activity Tolerance Patient tolerated treatment well   Patient Left in chair;with call bell/phone within reach   Nurse Communication Mobility status        Time: 1027-2536 OT Time Calculation (min): 27 min  Charges: OT General Charges $OT Visit: 1 Visit OT Treatments $Self Care/Home Management : 23-37 mins  [place], OTR/L Acute Rehab Pager: [phone] Office: [place] 12/01/2018, 10:18 AM

## 2018-12-01 NOTE — Progress Notes (Signed)
Pt discharged CIR.. report given to [facility] staff.. Report called CIR given to nurse at [phone]. Pt taken out to [facility] for transport.

## 2018-12-01 NOTE — H&P (Signed)
Physical Medicine and Rehabilitation Admission H&P    Chief Complaint  Patient presents with  . Debility  . Post COVID    HPI: Andrew Larsen is a 78 year old male with history of CKD, PAD, HTN, T2DM, OSA , recent evaluation at [facility] with diagnosis of inclusion body myositis; who was admitted on 11/03/18 with nonproductive cough and SOB. He had been exposed to multiple family members with COVID and found out that his outpatient test was positive.  He was admitted for observation and CXR revealed bilateral interstitial prominence due to pneumonitis v/s CHF. He continued to decline and was treated with Remdesivir,  Actemra, steroids and convalescent plasma.  He was monitored in ICU due to high oxygen up to 60% HFNC/ 35 L  --PCCM recommended goal at rest to be SAO2 > 85%.  Fluid overload treated with intermittent IV diuresis. He has improved with supportive care and has been weaned down to  4- 6 L oxygen per [place]. Therapy ongoing and patient noted to be limited by bouts of coughing, DOE/hypoxia,  BLE weakness as well as debility. CIR recommended due to functional decline.    Review of Systems  Constitutional: Negative for chills and fever.  HENT: Negative for hearing loss and tinnitus.   Eyes: Negative for blurred vision and double vision.  Respiratory: Positive for cough and shortness of breath.   Cardiovascular: Positive for leg swelling. Negative for chest pain and palpitations.  Gastrointestinal: Negative for constipation, heartburn and nausea.  Genitourinary: Negative for dysuria.  Musculoskeletal: Positive for falls.  Skin: Negative for rash.  Neurological: Positive for sensory change (BUE/BLE) and weakness. Negative for dizziness and headaches.      Past Medical History:  Diagnosis Date  . CKD (chronic kidney disease)    CKD stage III (01/2018)  . Daytime somnolence   . Diabetes mellitus without complication ([place])   . Fracture    right fibula/wears boot cast  . GERD  (gastroesophageal reflux disease)   . Glaucoma   . Hypercholesteremia   . Hyperlipidemia   . Hypertension   . Mold exposure   . PAD (peripheral artery disease) ([place])   . Sleep apnea     Past Surgical History:  Procedure Laterality Date  . LOWER EXTREMITY ANGIOGRAPHY N/A 06/12/2016   Procedure: Lower Extremity Angiography;  Surgeon: Adrian Prows, MD;  Location: [place] [facility];  Service: Cardiovascular;  Laterality: N/A;  . MUSCLE BIOPSY Right 05/06/2018   Procedure: right vastus lateralis muscle biopsy;  Surgeon: Earnie Larsson, MD;  Location: [place];  Service: Neurosurgery;  Laterality: Right;  . ORIF ANKLE FRACTURE Right 10/02/2012   Procedure: OPEN REDUCTION INTERNAL FIXATION (ORIF) ANKLE FRACTURE;  Surgeon: Meredith Pel, MD;  Location: [facility];  Service: Orthopedics;  Laterality: Right;  . PERIPHERAL VASCULAR ATHERECTOMY Left 06/12/2016   Procedure: Peripheral Vascular Atherectomy-Left Popliteal;  Surgeon: Adrian Prows, MD;  Location: [place] [facility];  Service: Cardiovascular;  Laterality: Left;  . PERIPHERAL VASCULAR INTERVENTION Left 06/12/2016   Procedure: Peripheral Vascular Intervention- DCB Left Popliteal;  Surgeon: Adrian Prows, MD;  Location: [place] [facility];  Service: Cardiovascular;  Laterality: Left;    Family History  Problem Relation Age of Onset  . Heart disease Father   . Prostate cancer Brother   . Cancer Brother   . Diabetes Brother   . Colon cancer Neg Hx     Social History:  Was in process of being transitioned to outpatient PT prior to admission.  reports that he quit smoking about 37 years ago. He has a 20.00 pack-year smoking history. He has never used smokeless tobacco. He reports that he does not drink alcohol or use drugs.    Allergies  Allergen Reactions  . Metformin And Related   . Simbrinza [Brinzolamide-Brimonidine]     Drainage, redness to eyes    Medications Prior to Admission  Medication Sig Dispense Refill  . aspirin 81 MG chewable tablet  Chew 81 mg by mouth at bedtime.     . brimonidine-timolol (COMBIGAN) 0.2-0.5 % ophthalmic solution Place 1 drop into both eyes every 12 (twelve) hours.    . calcium carbonate (CALCIUM 600) 600 MG TABS tablet Take 1,200 mg by mouth daily.     . Cholecalciferol (VITAMIN D3) 5000 units TABS Take 10,000 Units by mouth daily.     . cilostazol (PLETAL) 100 MG tablet Take 100 mg by mouth 2 (two) times daily.    . Evolocumab (REPATHA) 140 MG/ML SOSY Inject 140 mg into the skin every 14 (fourteen) days.    . famotidine (PEPCID) 20 MG tablet Take 20 mg by mouth at bedtime.    . furosemide (LASIX) 40 MG tablet Take 40 mg by mouth daily.    Marland Kitchen gabapentin (NEURONTIN) 300 MG capsule Take 600 mg by mouth 3 (three) times daily.     . insulin NPH-regular Human (NOVOLIN 70/30) (70-30) 100 UNIT/ML injection Inject 30-50 Units into the skin See admin instructions. Inject 50 units subcutaneously in am and 30 units in the evening    . latanoprost (XALATAN) 0.005 % ophthalmic solution Place 1 drop into both eyes at bedtime.    . magnesium gluconate (MAGONATE) 500 MG tablet Take 500 mg by mouth daily.    . Multiple Vitamin (MULTIVITAMIN) capsule Take 1 capsule by mouth daily.     Marland Kitchen olmesartan (BENICAR) 20 MG tablet Take 20 mg by mouth daily.    Marland Kitchen oxybutynin (DITROPAN) 5 MG tablet Take 5 mg by mouth 2 (two) times daily.    . potassium chloride SA (K-DUR,KLOR-CON) 20 MEQ tablet Take 20 mEq by mouth daily.    . tamsulosin (FLOMAX) 0.4 MG CAPS capsule Take 0.4 mg by mouth daily.    . valsartan (DIOVAN) 160 MG tablet Take 160 mg by mouth daily.      Drug Regimen Review  Drug regimen was reviewed and remains appropriate with no significant issues identified  Home: Home Living Family/patient expects to be discharged to:: Private residence Living Arrangements: (lives with daughter < [name] for about 3 years) Available Help at Discharge: Family, Available 24 hours/day(daughter working remotely since 06/2018; English as a second language teacher f) Type of Home: House Home Access: Stairs to enter [employer] of Steps: steps through garage modified so about 2 steps per daughter Home Layout: One level Bathroom Shower/Tub: Chiropodist: Standard Bathroom Accessibility: Yes Home Equipment: Environmental consultant - 2 wheels, [place] - single point Additional Comments: was to start outpt therapy prior to admission as referred by his neurologist  Lives With: Daughter   Functional History: Prior Function Level of Independence: Independent with assistive device(s) Gait / Transfers Assistance Needed: inconisstently ambulates with cane or RW; furniture walks or holds onto walls ADL's / [place]: IADLS Communication / Swallowing Assistance Needed: wfl Comments: progressive weakness over past year; seeing local and [place] neurologist for DX  Functional Status:  Mobility: Bed Mobility Overal bed mobility: Needs Assistance Bed Mobility: Supine to Sit Rolling: Min guard Sidelying to sit: Min guard Supine  to sit: Min guard, HOB elevated Sit to supine: Supervision Sit to sidelying: Mod assist General bed mobility comments: Min Guard A for safety. No physical A needed Transfers Overall transfer level: Needs assistance Equipment used: Rolling walker (2 wheeled) Transfers: Sit to/from Stand, [employer] Transfers Sit to Stand: Min guard Stand pivot transfers: Min guard General transfer comment: Min Guard A for safety with used of RW. Pt with LOB and sitting back down on EOB. With second attempt, pt able to gain balance in standing.  Ambulation/Gait Ambulation/Gait assistance: Min assist, +2 safety/equipment Gait Distance (Feet): 10 Feet Assistive device: Rolling walker (2 wheeled) Gait Pattern/deviations: Step-to pattern, Decreased step length - left, Decreased step length - right General Gait Details: Right knee hyperextends during stand, Ankle tends to invert. slight foot drop right foot. PT  guarded the right knee during stance to guard against bucckling Gait velocity: Decreased Gait velocity interpretation: <1.31 ft/sec, indicative of household ambulator    ADL: ADL Overall ADL's : Needs assistance/impaired Eating/Feeding: Independent, Sitting Eating/Feeding Details (indicate cue type and reason): switched to HFNC from NRB end of session to eat lunch Grooming: Wash/dry hands, Set up, Sitting Grooming Details (indicate cue type and reason): Providing pt with hand sanitizer Upper Body Bathing: Moderate assistance, Sitting Upper Body Bathing Details (indicate cue type and reason): assist for back Lower Body Bathing: Moderate assistance, Sit to/from stand Lower Body Bathing Details (indicate cue type and reason): Pt able to wash legs and anterior peri area while seated with lateral cleaning. Mod A for cleaning posterior peri area Upper Body Dressing : Min guard, Sitting Lower Body Dressing: Maximal assistance Lower Body Dressing Details (indicate cue type and reason): Pt bending forward to adjust socks demonstrating increased activity tolerance. Toilet Transfer: Min guard, RW, Ambulation, Minimal assistance(Simulated to recliner) Armed forces technical officer Details (indicate cue type and reason): Pt performing mobility to recliner with Min Guard A and RW.  Toileting- Clothing Manipulation and Hygiene: Supervision/safety, Set up, Sitting/lateral lean Toileting - Clothing Manipulation Details (indicate cue type and reason): Pt performing peri care while sitting on BSC and laterally leaning.  Functional mobility during ADLs: Min guard, Rolling walker, Cueing for safety, Minimal assistance(Short distance in room) General ADL Comments: Pt performing fucntional mobility to recliner for breakfast. Required Min Guard-Min A for safety and balance in mobility. SpO2 dropping to 78% on 4L (monitor on finger). Pt able to recover to 90s with seated rest break (~2 min) and purse lip breathing.  Cognition:  Cognition Overall Cognitive Status: Within Functional Limits for tasks assessed Orientation Level: Oriented X4 Cognition Arousal/Alertness: Awake/alert Behavior During Therapy: WFL for tasks assessed/performed Overall Cognitive Status: Within Functional Limits for tasks assessed General Comments: Continues to be motivated to participate in therapy   Blood pressure 124/63, pulse (!) 114, temperature 97.9 F (36.6 C), temperature source Oral, resp. rate 18, height 5\' 11"  (1.803 m), weight 114.5 kg, SpO2 (!) 89 %. Physical Exam  Nursing note and vitals reviewed. Constitutional: He is oriented to person, place, and time. He appears well-nourished. Nasal cannula in place.  obese  HENT:  Head: Normocephalic and atraumatic.  Cardiovascular: Normal rate and regular rhythm.  No murmur heard. Respiratory: No respiratory distress. He has no wheezes.  Decreased air movement  GI: Soft. He exhibits no distension. There is no abdominal tenderness. There is no rebound.  Musculoskeletal:        General: Edema (min pedal edema BLE) present.     Comments: Multiple healed abrasions BLE.  Neurological: He is  alert and oriented to person, place, and time. No cranial nerve deficit.  Normal insight and awareness. Functional memory and language. Good sitting balance. Motor 4+/5 UE. LE: 4-/5 HF, KE and 4/5 ADF/PF. No focal sensory deficits.   Skin: Skin is warm and dry.  Psychiatric: He has a normal mood and affect. His behavior is normal.    Results for orders placed or performed during the hospital encounter of 11/03/18 (from the past 48 hour(s))  Glucose, capillary     Status: Abnormal   Collection Time: 11/29/18 11:57 AM  Result Value Ref Range   Glucose-Capillary 241 (H) 70 - 99 mg/dL  Glucose, capillary     Status: Abnormal   Collection Time: 11/29/18  5:01 PM  Result Value Ref Range   Glucose-Capillary 250 (H) 70 - 99 mg/dL  Glucose, capillary     Status: Abnormal   Collection Time: 11/29/18   9:17 PM  Result Value Ref Range   Glucose-Capillary 229 (H) 70 - 99 mg/dL  Glucose, capillary     Status: Abnormal   Collection Time: 11/30/18  7:58 AM  Result Value Ref Range   Glucose-Capillary 157 (H) 70 - 99 mg/dL  Glucose, capillary     Status: Abnormal   Collection Time: 11/30/18 11:49 AM  Result Value Ref Range   Glucose-Capillary 244 (H) 70 - 99 mg/dL  Glucose, capillary     Status: Abnormal   Collection Time: 11/30/18  4:55 PM  Result Value Ref Range   Glucose-Capillary 234 (H) 70 - 99 mg/dL  Glucose, capillary     Status: Abnormal   Collection Time: 11/30/18  9:36 PM  Result Value Ref Range   Glucose-Capillary 151 (H) 70 - 99 mg/dL  Glucose, capillary     Status: Abnormal   Collection Time: 12/01/18  8:02 AM  Result Value Ref Range   Glucose-Capillary 190 (H) 70 - 99 mg/dL   No results found.     Medical Problem List and Plan: 1.  Functional and mobility deficits secondary to debility after ARDS, COVID  -admit to inpatient rehab 2.  Antithrombotics: -DVT/anticoagulation:  Pharmaceutical: Lovenox  -antiplatelet therapy:  3. Pain Management: tylenol prn  4. Mood: LCSW to follow for evaluation and support.   -antipsychotic agents: N/a 5. Neuropsych: This patient is capable of making decisions on his own behalf. 6. Skin/Wound Care: Will order air mattress overlay. Local care to sacral decub.  7. Fluids/Electrolytes/Nutrition: Monitor I/O. Check lytes in am.  8. DQQ:IWLNLGX BP tid--continue  9. CKD: Continue to monitor with serial checks. Cr 1.34 most recently  --will continue to monitor.  9. T2DM: Hgb A1C- 7.3. Continue  10. CAD: Will check daily weights and monitor for signs of overload. Continue Lasix and lopressor.  11. Inclusion body myositis: Has completed steroid taper.  12. OSA: Question ability to transition to CPAP 13. Thrombocytopenia: Recheck CBC in am.  14. Hyponatremia: will continue to monitor for recovery.   15. Cough/Hypoxia: Encourage IS.  Continue to wean oxygen as able. On 3-4 L currently  -  Donebs QID with cough medications prn.  -wean oxygen as able     Bary Leriche, PA-C 12/01/2018

## 2018-12-01 NOTE — Progress Notes (Signed)
Meredith Staggers, MD  Physician  Physical Medicine and Rehabilitation  PMR Pre-admission  Signed  Date of Service:  11/26/2018 3:33 PM      Related encounter: ED to Hosp-Admission (Discharged) from 11/03/2018 in [place]      Signed         Show:Clear all [x] Manual[x] Template[x] Copied  Added by: [x] Julious Payer, Vertis Kelch, RN[x] Meredith Staggers, MD[x] Michel Santee, PT  [] Hover for details PMR Admission Coordinator Pre-Admission Assessment  Patient: Andrew Larsen is an 78 y.o., male MRN: [id number] DOB: 1940-12-03 Height: 5' 11"  (180.3 cm) Weight: 114.5 kg  Insurance Information HMO: yes    PPO:      PCP:      IPA:      80/20:      OTHER: Medicare advantage plan PRIMARY: [employer]       Policy#: [id number]      Subscriber: pt CM Name: Monica Martinez      Phone#: [phone]    Fax#: [phone] Pre-Cert#: [id number] [place] provided by Enis Gash with updates due to her on 8/10 at fax listed above      Employer: n/a Benefits:  Phone #: [phone]     Name:  Eff. Date: 04/30/2018     Deduct: $0      Out of Pocket Max: $3400 (met [phone])      Life Max: n/a CIR: $295/day for days 1-6      SNF: 20 full days Outpatient:      Co-Pay: $10-$40/visit Home Health: 100%      Co-Pay:  DME: 80%     Co-Pay: 20% Providers:  SECONDARY: none      Medicaid Application Date:       Case Manager:  Disability Application Date:       Case Worker:   The "Data Collection Information Summary" for patients in Inpatient Rehabilitation Facilities with attached "Privacy Act [place] Records" was provided and verbally reviewed with: Family  Emergency Contact Information         Contact Information    Name Relation Home Work Mobile   [facility] Daughter [phone]  [phone]      Current Medical History  Patient Admitting Diagnosis: Debility  Post COVID  History of Present Illness: 78 year old male with medical history of PAD,  DM, HTN, BPH, OSA with presents on 7/06 due to SOB and non productive cough. He has had family contact with family members tested positive for COVID. Diagnosed with acute hypoxic respiratory failure in the setting of COVID 19 Pneumonia. Hospital course complicated by worsening hypoxemia necessitating ICU stay. Treated with Actemra, convalescent plasma, steroids and Remdesivir.   Slow clinical improvement with Oxygen requirements anywhere from 5 to 8 liters to maintain O2 sats. No evidence of volume overload, but given IV Lasix periodically to maintain negative balance. Encourage supportive measures and use of incentive spirometer. Recent diagnosis of inclusion body Myositis by [facility] Dr. Warnell Forester pta. Slow taper of steroids recommended.   Mild epistaxis on 7/22 and 7/28 treated with nasal saline spray and to recive Afrin for 2 days.   CHF clinically compensate with periodic IV lasix to maintain negative balance.HTN currently controlled , not on any antihypertensives . BPH continue Flomax. OSA unable to use CPAP, continue on high flow oxygen at night for now.  Recent dx of inclusion body myositis to f/u with Dr. Warnell Forester at [facility] [phone]) on steroid taper.  Stage 2 sacral decubitus per WOC treatment.   COVID negative  7/26 and COVID 7/29.    Patient's medical record from [facility] has been reviewed by the rehabilitation admission coordinator and physician.  Past Medical History      Past Medical History:  Diagnosis Date  . CKD (chronic kidney disease)    CKD stage III (01/2018)  . Daytime somnolence   . Diabetes mellitus without complication ([place])   . Fracture    right fibula/wears boot cast  . GERD (gastroesophageal reflux disease)   . Glaucoma   . Hypercholesteremia   . Hyperlipidemia   . Hypertension   . Mold exposure   . PAD (peripheral artery disease) ([place])   . Sleep apnea     Family History   family history includes Cancer in his brother;  Diabetes in his brother; Heart disease in his father; Prostate cancer in his brother.  Prior Rehab/Hospitalizations Has the patient had prior rehab or hospitalizations prior to admission? Yes  Has the patient had major surgery during 100 days prior to admission? No              Current Medications  Current Facility-Administered Medications:  .  acetaminophen (TYLENOL) tablet 650 mg, 650 mg, Oral, Q6H PRN, Dessa Phi, DO, 650 mg at 11/27/18 2123 .  alum & mag hydroxide-simeth (MAALOX/MYLANTA) 200-200-20 MG/5ML suspension 30 mL, 30 mL, Oral, Q6H PRN, Jonetta Osgood, MD, 30 mL at 11/30/18 0908 .  aspirin chewable tablet 81 mg, 81 mg, Oral, QHS, Dessa Phi, DO, 81 mg at 11/30/18 2239 .  benzonatate (TESSALON) capsule 200 mg, 200 mg, Oral, TID PRN, Etta Quill, DO, 200 mg at 11/21/18 1123 .  brimonidine (ALPHAGAN) 0.15 % ophthalmic solution 1 drop, 1 drop, Both Eyes, BID, Allie Bossier, MD, 1 drop at 12/01/18 1017 .  chlorpheniramine-HYDROcodone (TUSSIONEX) 10-8 MG/5ML suspension 5 mL, 5 mL, Oral, Q12H PRN, Etta Quill, DO, 5 mL at 11/28/18 2218 .  cilostazol (PLETAL) tablet 100 mg, 100 mg, Oral, BID, Dessa Phi, DO, 100 mg at 12/01/18 1019 .  enoxaparin (LOVENOX) injection 60 mg, 60 mg, Subcutaneous, Q24H, Franky Macho, RPH, 60 mg at 12/01/18 1017 .  famotidine (PEPCID) tablet 20 mg, 20 mg, Oral, QHS, Dessa Phi, DO, 20 mg at 11/30/18 2240 .  folic acid (FOLVITE) tablet 1 mg, 1 mg, Oral, Daily, Allie Bossier, MD, 1 mg at 12/01/18 1016 .  furosemide (LASIX) tablet 40 mg, 40 mg, Oral, Daily, Cherene Altes, MD, 40 mg at 12/01/18 1016 .  gabapentin (NEURONTIN) capsule 600 mg, 600 mg, Oral, TID, Dessa Phi, DO, 600 mg at 12/01/18 1017 .  guaiFENesin-dextromethorphan (ROBITUSSIN DM) 100-10 MG/5ML syrup 5 mL, 5 mL, Oral, Q4H PRN, Cherene Altes, MD, 5 mL at 11/30/18 2253 .  insulin aspart (novoLOG) injection 0-20 Units, 0-20 Units, Subcutaneous, TID WC,  Elgergawy, Silver Huguenin, MD, 4 Units at 12/01/18 [phone] .  insulin aspart protamine- aspart (NOVOLOG MIX 70/30) injection 18 Units, 18 Units, Subcutaneous, Q supper, Cherene Altes, MD, 18 Units at 11/30/18 1705 .  insulin aspart protamine- aspart (NOVOLOG MIX 70/30) injection 34 Units, 34 Units, Subcutaneous, Q breakfast, Cherene Altes, MD, 34 Units at 12/01/18 [phone] .  Ipratropium-Albuterol (COMBIVENT) respimat 2 puff, 2 puff, Inhalation, QID, Bonnielee Haff, MD, 2 puff at 12/01/18 [phone] .  latanoprost (XALATAN) 0.005 % ophthalmic solution 1 drop, 1 drop, Both Eyes, QHS, Choi, Jennifer, DO, 1 drop at 11/30/18 2245 .  metoprolol tartrate (LOPRESSOR) tablet 25 mg, 25 mg, Oral, BID, Ghimire, Shanker  M, MD, 25 mg at 12/01/18 1016 .  ondansetron (ZOFRAN) tablet 4 mg, 4 mg, Oral, Q6H PRN **OR** ondansetron (ZOFRAN) injection 4 mg, 4 mg, Intravenous, Q6H PRN, Dessa Phi, DO .  oxybutynin (DITROPAN) tablet 5 mg, 5 mg, Oral, BID, Dessa Phi, DO, 5 mg at 12/01/18 1016 .  pantoprazole (PROTONIX) EC tablet 40 mg, 40 mg, Oral, Daily, Ghimire, Henreitta Leber, MD, 40 mg at 12/01/18 1016 .  protein supplement (ENSURE MAX) liquid, 11 oz, Oral, BID, Cherene Altes, MD, 11 oz at 11/30/18 2013 .  senna-docusate (Senokot-S) tablet 1 tablet, 1 tablet, Oral, QHS PRN, Dessa Phi, DO, 1 tablet at 11/25/18 2111 .  senna-docusate (Senokot-S) tablet 2 tablet, 2 tablet, Oral, BID, Elgergawy, Silver Huguenin, MD, 2 tablet at 12/01/18 1017 .  sodium chloride (OCEAN) 0.65 % nasal spray 1 spray, 1 spray, Each Nare, PRN, Elgergawy, Silver Huguenin, MD .  tamsulosin (FLOMAX) capsule 0.4 mg, 0.4 mg, Oral, Daily, Dessa Phi, DO, 0.4 mg at 12/01/18 1017 .  thiamine (VITAMIN B-1) tablet 100 mg, 100 mg, Oral, Daily, Allie Bossier, MD, 100 mg at 12/01/18 1016 .  [DISCONTINUED] brimonidine (ALPHAGAN) 0.2 % ophthalmic solution 1 drop, 1 drop, Both Eyes, BID, 1 drop at 11/07/18 0824 **AND** timolol (TIMOPTIC) 0.5 % ophthalmic solution 1  drop, 1 drop, Both Eyes, BID, Cherene Altes, MD, 1 drop at 12/01/18 1017 .  vitamin C (ASCORBIC ACID) tablet 500 mg, 500 mg, Oral, Daily, Allie Bossier, MD, 500 mg at 12/01/18 1016 .  zinc sulfate capsule 220 mg, 220 mg, Oral, Daily, Allie Bossier, MD, 220 mg at 12/01/18 1016  Patients Current Diet:     Diet Order                  Diet regular Room service appropriate? Yes; Fluid consistency: Thin  Diet effective now               Precautions / Restrictions Precautions Precautions: Fall Precaution Comments: R > L leg weakness.right Hyperextends with stance, must monitor VS, trying to wean to </=4 [place] Restrictions Weight Bearing Restrictions: No   Has the patient had 2 or more falls or a fall with injury in the past year? Yes Frequent falls over past year progressively worse. Has local Neurologist as well as recent [facility] Neurologist for dx  Prior Activity Level Limited Community (1-2x/wk): does not drive; I with adls. Would inconsistent use RW and CANE. Progressive lower Extremity weakness with multiple falls over past year. Furniture walked and used wall to hold onto. New diagnosis of Inclusion Body Myositis [facility] Dr. Warnell Forester, pt unaware of final dx  Prior Functional Level Self Care: Did the patient need help bathing, dressing, using the toilet or eating? Independent  Indoor Mobility: Did the patient need assistance with walking from room to room (with or without device)? Independent  Stairs: Did the patient need assistance with internal or external stairs (with or without device)? Needed some help  Functional Cognition: Did the patient need help planning regular tasks such as shopping or remembering to take medications? Independent  Home Assistive Devices / Equipment Home Assistive Devices/Equipment: Eyeglasses, CPAP, Cane (specify quad or straight) Home Equipment: Walker - 2 wheels, Cane - single point  Prior Device Use: Indicate devices/aids used by the  patient prior to current illness, exacerbation or injury? Walker and cane occasionally  Current Functional Level Cognition  Overall Cognitive Status: Within Functional Limits for tasks assessed Orientation Level: Oriented X4 General Comments: Continues  to be motivated to participate in therapy    Extremity Assessment (includes Sensation/Coordination)  Upper Extremity Assessment: Generalized weakness  Lower Extremity Assessment: Defer to PT evaluation RLE Deficits / Details: hip flexion 2+/5, knee ext 2/5, knee flexion 3/5, dorsiflex 3//5 RLE Sensation: history of peripheral neuropathy LLE Deficits / Details: general weakness, at least 4/5 LLE Sensation: history of peripheral neuropathy    ADLs  Overall ADL's : Needs assistance/impaired Eating/Feeding: Independent, Sitting Eating/Feeding Details (indicate cue type and reason): switched to HFNC from NRB end of session to eat lunch Grooming: Wash/dry hands, Set up, Sitting Grooming Details (indicate cue type and reason): Providing pt with hand sanitizer Upper Body Bathing: Moderate assistance, Sitting Upper Body Bathing Details (indicate cue type and reason): assist for back Lower Body Bathing: Moderate assistance, Sit to/from stand Lower Body Bathing Details (indicate cue type and reason): Pt able to wash legs and anterior peri area while seated with lateral cleaning. Mod A for cleaning posterior peri area Upper Body Dressing : Min guard, Sitting Lower Body Dressing: Maximal assistance Lower Body Dressing Details (indicate cue type and reason): Pt bending forward to adjust socks demonstrating increased activity tolerance. Toilet Transfer: Min guard, RW, Ambulation, Minimal assistance(Simulated to recliner) Armed forces technical officer Details (indicate cue type and reason): Pt performing mobility to recliner with Min Guard A and RW.  Toileting- Clothing Manipulation and Hygiene: Supervision/safety, Set up, Sitting/lateral lean Toileting -  Clothing Manipulation Details (indicate cue type and reason): Pt performing peri care while sitting on BSC and laterally leaning.  Functional mobility during ADLs: Min guard, Rolling walker, Cueing for safety, Minimal assistance(Short distance in room) General ADL Comments: Pt performing fucntional mobility to recliner for breakfast. Required Min Guard-Min A for safety and balance in mobility. SpO2 dropping to 78% on 4L (monitor on finger). Pt able to recover to 90s with seated rest break (~2 min) and purse lip breathing.    Mobility  Overal bed mobility: Needs Assistance Bed Mobility: Supine to Sit Rolling: Min guard Sidelying to sit: Min guard Supine to sit: Min guard, HOB elevated Sit to supine: Supervision Sit to sidelying: Mod assist General bed mobility comments: Min Guard A for safety. No physical A needed    Transfers  Overall transfer level: Needs assistance Equipment used: Rolling walker (2 wheeled) Transfers: Sit to/from Stand, [employer] Transfers Sit to Stand: Min guard Stand pivot transfers: Min guard General transfer comment: Min Guard A for safety with used of RW. Pt with LOB and sitting back down on EOB. With second attempt, pt able to gain balance in standing.     Ambulation / Gait / Stairs / Wheelchair Mobility  Ambulation/Gait Ambulation/Gait assistance: Min assist, +2 safety/equipment Gait Distance (Feet): 10 Feet Assistive device: Rolling walker (2 wheeled) Gait Pattern/deviations: Step-to pattern, Decreased step length - left, Decreased step length - right General Gait Details: Right knee hyperextends during stand, Ankle tends to invert. slight foot drop right foot. PT guarded the right knee during stance to guard against bucckling Gait velocity: Decreased Gait velocity interpretation: <1.31 ft/sec, indicative of household ambulator    Posture / Balance Dynamic Sitting Balance Sitting balance - Comments: EOB and in recliner Balance Overall balance  assessment: History of Falls, Needs assistance Sitting-balance support: Feet supported Sitting balance-Leahy Scale: Good Sitting balance - Comments: EOB and in recliner Standing balance support: Bilateral upper extremity supported, During functional activity Standing balance-Leahy Scale: Poor Standing balance comment: Reliant on UE support    Special needs/care consideration BiPAP/CPAP used CPAP pta  CPM  N/a Continuous Drip IV no Dialysis n/a Life Vest  N/a Oxygen on 4L via nasal cannula at rest, increasing needs with exertion Special Bed  N/a Trach Size  N/a Wound Vac n/a Skin       McNichol, Rudi Heap, RN  Registered Nurse  WOC  Consult Note   Signed   Date of Service:  11/23/2018 12:21 PM            Signed         Show:Clear all [x] ?Manual[x] ?Template[] ?Copied  Added by: [x] ?McNichol, Rudi Heap, RN  [] ?Hover for details [place] Nurse wound consult note Reason for Consult:Stage 2 open area on left medial buttock at gluteal cleft. I have discussed the area with both bedside RN, Christy and Dr. Sloan Leiter. I appreciate the photograph provided to me by the bedside RN and via text to my personal phone and have deleted it following this consultation. All meas to protect the patient privacy with the technology assisted assessment were employed. It is noted that the patient has a history of OSA and uses HOB elevation to assist his breathing, increasing pressure on this lower buttock region. Wound type:Pressure Pressure Injury POA: No Measurement:2cm x 2cm x 0.2cm Wound bed:Red, moist with basement cell membrane (capillary buds) noted. Minute amount of yellow fibrinous tissue noted. Drainage (amount, consistency, odor)Small amount serous Periwound:intact Dressing procedure/placement/frequency:I provided this consultation today via technology with assistance from both Dr. Sloan Leiter and the patient's bedside nurse, Alyse Low, RN. Their assistance is appreciated. A photograph  was sent to this writer to verify verbal assessment. The POC is provided for topical care including cleansing, topping with a size-appropriate piece of xeroform gauze topped with a dry gauze 2x2 and covered with our house silicone foam dressing. Patient to offload this area by attempting to lie on his alternating sides while in bed, minimizing time in the supine position. A pressure redistribution chair pad is provided for his OOB use while in a chair.  [place] nursing team will not follow, but will remain available to this patient, the nursing and medical teams. Please re-consult if needed. Thanks, Maudie Flakes, MSN, RN, [place], Arther Abbott  Pager# [phone]                         Bowel mgmt: continent, last BM 7/28 Bladder mgmt: continent, urgency Diabetic mgmt: yes pta on Januvia and insulin Hgb A1c 7.3 Behavioral consideration  N/a Chemo/radiation  N/a   Previous Home Environment  Living Arrangements: (lives with daughter < [name] for about 3 years)  Lives With: Daughter Available Help at Discharge: Family, Available 24 hours/day(daughter working remotely since 06/2018; Child psychotherapist f) Type of Home: [place]: One level Home Access: Stairs to enter [employer] of Steps: steps through garage modified so about 2 steps per daughter Bathroom Shower/Tub: Chiropodist: Standard Bathroom Accessibility: Yes How Accessible: Accessible via walker [place]: No Additional Comments: was to start outpt therapy prior to admission as referred by his neurologist  Discharge Living Setting Plans for Discharge Living Setting: Lives with (comment)(daughter, [name] for about 3 years) Type of Home at Discharge: House Discharge Home Layout: One level Discharge Home Access: Stairs to enter Entrance Stairs-Rails: None Entrance Stairs-Number of Steps: steps through garage modifeid for easy entry , about 2 steps Discharge  Bathroom Shower/Tub: Tub/shower unit, Curtain Discharge Bathroom Toilet: Standard Discharge Bathroom Accessibility: Yes How Accessible: Accessible via walker Does the patient have any problems  obtaining your medications?: No  Social/Family/Support Systems Patient Roles: Parent Contact Information: [name], daughter Anticipated Caregiver: daughter Anticipated Caregiver's Contact Information: [phone] Ability/Limitations of Caregiver: works remotely since 06/2018. Child psychotherapist for [employer] Caregiver Availability: 24/7 Discharge Plan Discussed with Primary Caregiver: Yes Is Caregiver In Agreement with Plan?: Yes Does Caregiver/Family have Issues with Lodging/Transportation while Pt is in Rehab?: No  Goals/Additional Needs Patient/Family Goal for Rehab: supervision to min asisst with PT and OT Expected length of stay: ELOS 14 to 20 days Pt/Family Agrees to Admission and willing to participate: Yes Program Orientation Provided & Reviewed with Pt/Caregiver Including Roles  & Responsibilities: Yes  Decrease burden of Care through IP rehab admission: n/a  Possible need for SNF placement upon discharge: not anticipated  Patient Condition: I have reviewed medical records from [facility] , spoken with daughter. I discussed via phone for inpatient rehabilitation assessment.  Patient will benefit from ongoing PT and OT, can actively participate in 3 hours of therapy a day 5 days of the week, and can make measurable gains during the admission.  Patient will also benefit from the coordinated team approach during an Inpatient Acute Rehabilitation admission.  The patient will receive intensive therapy as well as Rehabilitation physician, nursing, social worker, and care management interventions.  Due to bladder management, bowel management, safety, skin/wound care, disease management, medication administration, pain management and patient education the patient requires 24 hour a day  rehabilitation nursing.  The patient is currently min with mobility and basic ADLs.  Discharge setting and therapy post discharge at home with home health is anticipated.  Patient has agreed to participate in the Acute Inpatient Rehabilitation Program and will admit today.  Preadmission Screen Completed By: Danne Baxter, RN MSN with updates by Shann Medal, PT, DPT, 12/01/2018 10:41 AM ______________________________________________________________________   Discussed status with Dr. Naaman Plummer on 12/01/18  at 10:41 AM  and received approval for admission today.  Admission Coordinator: Danne Baxter, RN MSN,  Michel Santee, PT, DPT time 10:41 AM Sudie Grumbling 12/01/18    Assessment/Plan: Diagnosis: debillity 1. Does the need for close, 24 hr/day Medical supervision in concert with the patient's rehab needs make it unreasonable for this patient to be served in a less intensive setting? Yes 2. Co-Morbidities requiring supervision/potential complications: CHF, multiple pulmonary considerations, DM, HTn, OSA 3. Due to bladder management, bowel management, safety, skin/wound care, disease management, medication administration, pain management and patient education, does the patient require 24 hr/day rehab nursing? Yes 4. Does the patient require coordinated care of a physician, rehab nurse, PT (1-2 hrs/day, 5 days/week) and OT (1-2 hrs/day, 5 days/week) to address physical and functional deficits in the context of the above medical diagnosis(es)? Yes Addressing deficits in the following areas: balance, endurance, locomotion, strength, transferring, bowel/bladder control, bathing, dressing, feeding, grooming, toileting and psychosocial support 5. Can the patient actively participate in an intensive therapy program of at least 3 hrs of therapy 5 days a week? Yes 6. The potential for patient to make measurable gains while on inpatient rehab is excellent 7. Anticipated functional outcomes upon discharge from  inpatients are: supervision and min assist PT, supervision and min assist OT, n/a SLP 8. Estimated rehab length of stay to reach the above functional goals is: 14-20 days 9. Anticipated D/C setting: Home 10. Anticipated post D/C treatments: [place] therapy 11. Overall Rehab/Functional Prognosis: excellent  MD Signature: Meredith Staggers, MD, [place] [facility] 12/01/2018         Revision  History

## 2018-12-01 NOTE — Discharge Summary (Signed)
DISCHARGE SUMMARY  Andrew Larsen  MR#: [id number]  DOB:1941/03/06  Date of Admission: 11/03/2018 Date of Discharge: 12/01/2018  Attending Physician:Juanito Gonyer Hennie Duos, MD  Patient's FTD:DUKGURKY, Nicki Reaper, MD  Consults: PCCM  Disposition: D/C to CIR   Follow-up Appts: [facility] Follow up.   Why: A reprsentative from [place] will deliver oxygen concentrator and tanks to your home. Should you have any problems with tanks please call [employer].  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 His hospital course was complicated by progressive hypoxemia requiring transfer to the ICU. He was treated with Actemra, Remdesivir, steroids, and convalescent plasma.  Hospital Course: 7/6 admit to [facility] via [facility]  7/10 transfer to ICU 7/17 transfer to [facility]  8/3 D/C to CIR   COVID pneumonia - acute hypoxic respiratory failure completed treatment with steroids, Remdesivir, Actemra, and convalescent plasma - continue nasal cannula oxygen support - wean oxygen as able - mobilize -stable for discharge/transfer to inpatient rehab unit at [facility]  Mild  epistaxis Resolved   Hyperkalemia Resolved   Chronic diastolic heart failure Continue maintenance/home dose diuretic -no gross overload on exam at time of discharge  DM 2 with neuropathy and hyperglycemia A1c 7.3 - off steroids at time of discharge with overall improvement in CBG -continue to follow CBG at rehab facility with possible need to further titrate insulin therapy  CKD stage III Creatinine stable at time of discharge at approximately 1.35  HTN Blood pressure controlled   CAD Asymptomatic throughout this hospital stay  PAD Asymptomatic throughout this hospital stay  BPH Asymptomatic throughout this hospital stay  OSA  Possible Inclusion body myositis Followed by Neurology at [facility] (records reviewed in [place] - no mention of specific med tx/steroids) - workup ongoing   Deconditioning Continue PT/OT for transfer/discharge to CIR  Stage II sacral decubitus  Active Medications at time of D/C / Transfer:  Allergies as of 12/01/2018      Reactions   Metformin And Related    Simbrinza [brinzolamide-brimonidine]    Drainage, redness to eyes      No current facility-administered medications for this encounter.  No current outpatient medications on file.  Facility-Administered Medications Ordered in Other Encounters:  .  acetaminophen (TYLENOL) tablet 325-650 mg, 325-650 mg, Oral, Q4H PRN, Love, Pamela S, PA-C .  alum & mag hydroxide-simeth (MAALOX/MYLANTA) 200-200-20 MG/5ML suspension 30 mL, 30 mL, Oral, Q4H PRN, Love, Pamela S, PA-C .  aspirin chewable tablet 81 mg, 81 mg, Oral, QHS, Love, Pamela S, PA-C .  benzonatate (TESSALON) capsule 200 mg, 200 mg, Oral, TID PRN, Love, Pamela S, PA-C .  bisacodyl (DULCOLAX) suppository 10 mg, 10 mg, Rectal, Daily PRN, Love, Pamela S, PA-C .  brimonidine (ALPHAGAN) 0.15 %  ophthalmic solution 1 drop, 1 drop, Both Eyes, BID, Love, Pamela S, PA-C .  chlorpheniramine-HYDROcodone (TUSSIONEX) 10-8 MG/5ML  suspension 5 mL, 5 mL, Oral, Q12H PRN, Love, Pamela S, PA-C .  cilostazol (PLETAL) tablet 100 mg, 100 mg, Oral, BID, Love, Pamela S, PA-C .  diphenhydrAMINE (BENADRYL) 12.5 MG/5ML elixir 12.5-25 mg, 12.5-25 mg, Oral, Q6H PRN, Love, Pamela S, PA-C .  enoxaparin (LOVENOX) injection 60 mg, 60 mg, Subcutaneous, Q24H, Love, Pamela S, PA-C .  [START ON 12/02/2018] enoxaparin (LOVENOX) injection 60 mg, 60 mg, Subcutaneous, Q24H, Love, Pamela S, PA-C .  famotidine (PEPCID) tablet 20 mg, 20 mg, Oral, QHS, Love, Pamela S, PA-C .  [START ON 06/02/7626] folic acid (FOLVITE) tablet 1 mg, 1 mg, Oral, Daily, Love, Pamela S, PA-C .  [START ON 12/02/2018] furosemide (LASIX) tablet 40 mg, 40 mg, Oral, Daily, Love, Pamela S, PA-C .  gabapentin (NEURONTIN) capsule 600 mg, 600 mg, Oral, TID, Love, Pamela S, PA-C .  guaiFENesin-dextromethorphan (ROBITUSSIN DM) 100-10 MG/5ML syrup 5 mL, 5 mL, Oral, Q4H PRN, Love, Pamela S, PA-C .  guaiFENesin-dextromethorphan (ROBITUSSIN DM) 100-10 MG/5ML syrup 5-10 mL, 5-10 mL, Oral, Q6H PRN, Love, Pamela S, PA-C .  insulin aspart (novoLOG) injection 0-20 Units, 0-20 Units, Subcutaneous, TID WC, Love, Pamela S, PA-C .  insulin aspart protamine- aspart (NOVOLOG MIX 70/30) injection 18 Units, 18 Units, Subcutaneous, Q supper, Love, Pamela S, PA-C .  [START ON 12/02/2018] insulin aspart protamine- aspart (NOVOLOG MIX 70/30) injection 34 Units, 34 Units, Subcutaneous, Q breakfast, Love, Pamela S, PA-C .  Ipratropium-Albuterol (COMBIVENT) respimat 2 puff, 2 puff, Inhalation, QID, Love, Pamela S, PA-C .  latanoprost (XALATAN) 0.005 % ophthalmic solution 1 drop, 1 drop, Both Eyes, QHS, Love, Pamela S, PA-C .  metoprolol tartrate (LOPRESSOR) tablet 25 mg, 25 mg, Oral, BID, Love, Pamela S, PA-C .  oxybutynin (DITROPAN) tablet 5 mg, 5 mg, Oral, BID, Love, Pamela S, PA-C .  [START ON 12/02/2018] pantoprazole (PROTONIX) EC tablet 40 mg, 40 mg, Oral, Daily, Love, Pamela S, PA-C .  polyethylene glycol  (MIRALAX / GLYCOLAX) packet 17 g, 17 g, Oral, Daily PRN, Love, Pamela S, PA-C .  prochlorperazine (COMPAZINE) tablet 5-10 mg, 5-10 mg, Oral, Q6H PRN **OR** prochlorperazine (COMPAZINE) injection 5-10 mg, 5-10 mg, Intramuscular, Q6H PRN **OR** prochlorperazine (COMPAZINE) suppository 12.5 mg, 12.5 mg, Rectal, Q6H PRN, Love, Pamela S, PA-C .  protein supplement (ENSURE MAX) liquid, 11 oz, Oral, BID, Love, Pamela S, PA-C .  senna-docusate (Senokot-S) tablet 2 tablet, 2 tablet, Oral, BID, Love, Pamela S, PA-C .  sodium chloride (OCEAN) 0.65 % nasal spray 1 spray, 1 spray, Each Nare, PRN, Love, Pamela S, PA-C .  sodium phosphate (FLEET) 7-19 GM/118ML enema 1 enema, 1 enema, Rectal, Once PRN, Love, Pamela S, PA-C .  [START ON 12/02/2018] tamsulosin (FLOMAX) capsule 0.4 mg, 0.4 mg, Oral, Daily, Love, Pamela S, PA-C .  [START ON 12/02/2018] thiamine (VITAMIN B-1) tablet 100 mg, 100 mg, Oral, Daily, Love, Pamela S, PA-C .  timolol (TIMOPTIC) 0.5 % ophthalmic solution 1 drop, 1 drop, Both Eyes, BID, Love, Pamela S, PA-C .  traZODone (DESYREL) tablet 25-50 mg, 25-50 mg, Oral, QHS PRN, Love, Pamela S, PA-C .  [START ON 12/02/2018] vitamin C (ASCORBIC ACID) tablet 500 mg, 500 mg, Oral, Daily, Love, Pamela S, PA-C .  [START ON 12/02/2018] zinc sulfate capsule 220 mg, 220 mg, Oral, Daily, Love, Pamela S, PA-C   Day of Discharge BP 134/80   Pulse 100   Temp  97.9 F (36.6 C) (Oral)   Resp 18   Ht 5\' 11"  (1.803 m)   Wt 114.5 kg   SpO2 (!) 89%   BMI 35.21 kg/m   Physical Exam: General: No acute respiratory distress Lungs: Fine crackles diffusely with no wheezing Cardiovascular: Regular rate and rhythm without murmur gallop or rub normal S1 and S2 Abdomen: Nontender, nondistended, overweight, soft, bowel sounds positive, no rebound, no ascites, no appreciable mass Extremities: Trace edema bilateral lower extremities without cyanosis  Basic Metabolic Panel: Recent Labs  Lab 11/25/18 0450 11/26/18 0600  11/27/18 0518  NA 131* 132* 134*  K 4.4 4.7 4.5  CL 94* 95* 96*  CO2 28 29 31   GLUCOSE 273* 297* 185*  BUN 35* 35* 33*  CREATININE 1.34* 1.39* 1.34*  CALCIUM 8.7* 8.9 9.0    Liver Function Tests: Recent Labs  Lab 11/25/18 0450 11/26/18 0600 11/27/18 0518  AST 16 16 17   ALT 34 31 28  ALKPHOS 60 62 60  BILITOT 0.9 0.8 0.8  PROT 5.3* 5.3* 5.5*  ALBUMIN 3.1* 3.1* 3.2*    CBC: Recent Labs  Lab 11/25/18 0450 11/26/18 0600 11/27/18 0518  WBC 7.4 6.3 6.7  HGB 14.0 13.2 13.6  HCT 40.2 38.0* 40.1  MCV 90.1 90.3 90.9  PLT 126* 118* 119*    BNP (last 3 results) Recent Labs    11/21/18 0345  BNP 29.4    CBG: Recent Labs  Lab 11/30/18 0758 11/30/18 1149 11/30/18 1655 11/30/18 2136 12/01/18 0802  GLUCAP 157* 244* 234* 151* 190*    Recent Results (from the past 240 hour(s))  Novel Coronavirus, NAA (hospital order; send-out to ref lab)     Status: None   Collection Time: 11/23/18 10:38 AM   Specimen: Nasopharyngeal Swab; Respiratory  Result Value Ref Range Status   SARS-CoV-2, NAA NOT DETECTED NOT DETECTED Final    Comment: (NOTE) This test was developed and its performance characteristics determined by [employer]. This test has not been FDA cleared or approved. This test has been authorized by FDA under an Emergency Use Authorization (EUA). This test is only authorized for the duration of time the declaration that circumstances exist justifying the authorization of the emergency use of in vitro diagnostic tests for detection of SARS-CoV-2 virus and/or diagnosis of COVID-19 infection under section 564(b)(1) of the Act, 21 U.S.C. 765YYT-0(P)(5), unless the authorization is terminated or revoked sooner. When diagnostic testing is negative, the possibility of a false negative result should be considered in the context of a patient's recent exposures and the presence of clinical signs and symptoms consistent with COVID-19. An individual without  symptoms of COVID-19 and who is not shedding SARS-CoV-2 virus would expect to have a negative (not detected) result in this assay. Performed  At: [facility] [place], [place] [id number] Rush Farmer MD TZ:[id number]    [place]  Final    Comment: Performed at [facility] [address], [place], [place] [address]  Novel Coronavirus, NAA (hospital order; send-out to ref lab)     Status: None   Collection Time: 11/26/18 11:32 AM   Specimen: Nasopharyngeal Swab; Respiratory  Result Value Ref Range Status   SARS-CoV-2, NAA NOT DETECTED NOT DETECTED Final    Comment: (NOTE) This test was developed and its performance characteristics determined by [employer]. This test has not been FDA cleared or approved. This test has been authorized by FDA under an Emergency Use Authorization (EUA). This test is  only authorized for the duration of time the declaration that circumstances exist justifying the authorization of the emergency use of in vitro diagnostic tests for detection of SARS-CoV-2 virus and/or diagnosis of COVID-19 infection under section 564(b)(1) of the Act, 21 U.S.C. 728ASU-0(R)(5), unless the authorization is terminated or revoked sooner. When diagnostic testing is negative, the possibility of a false negative result should be considered in the context of a patient's recent exposures and the presence of clinical signs and symptoms consistent with COVID-19. An individual without symptoms of COVID-19 and who is not shedding SARS-CoV-2 virus would expect to have a negative (not detected) result in this assay. Performed  At: [facility] [place], [place] [id number] Rush Farmer MD XM:[id number]    [place]  Final    Comment: Performed at [facility] [address] [place], [place] [address]     Time spent in discharge (includes  decision making & examination of pt): 35 minutes  12/01/2018, 8:46 AM   Cherene Altes, MD [facility] Office  [phone]

## 2018-12-02 ENCOUNTER — Encounter (HOSPITAL_COMMUNITY): Payer: Medicare HMO

## 2018-12-02 ENCOUNTER — Inpatient Hospital Stay (HOSPITAL_COMMUNITY): Payer: Medicare HMO | Admitting: Physical Therapy

## 2018-12-02 ENCOUNTER — Inpatient Hospital Stay (HOSPITAL_COMMUNITY): Payer: Medicare HMO

## 2018-12-02 ENCOUNTER — Inpatient Hospital Stay (HOSPITAL_COMMUNITY): Payer: Medicare HMO | Admitting: Occupational Therapy

## 2018-12-02 DIAGNOSIS — N183 Chronic kidney disease, stage 3 unspecified: Secondary | ICD-10-CM

## 2018-12-02 DIAGNOSIS — E46 Unspecified protein-calorie malnutrition: Secondary | ICD-10-CM

## 2018-12-02 DIAGNOSIS — Z9981 Dependence on supplemental oxygen: Secondary | ICD-10-CM

## 2018-12-02 DIAGNOSIS — E8809 Other disorders of plasma-protein metabolism, not elsewhere classified: Secondary | ICD-10-CM

## 2018-12-02 DIAGNOSIS — E1165 Type 2 diabetes mellitus with hyperglycemia: Secondary | ICD-10-CM

## 2018-12-02 DIAGNOSIS — N1831 Chronic kidney disease, stage 3a: Secondary | ICD-10-CM

## 2018-12-02 DIAGNOSIS — D72819 Decreased white blood cell count, unspecified: Secondary | ICD-10-CM

## 2018-12-02 DIAGNOSIS — I10 Essential (primary) hypertension: Secondary | ICD-10-CM

## 2018-12-02 DIAGNOSIS — E871 Hypo-osmolality and hyponatremia: Secondary | ICD-10-CM

## 2018-12-02 DIAGNOSIS — D696 Thrombocytopenia, unspecified: Secondary | ICD-10-CM

## 2018-12-02 LAB — COMPREHENSIVE METABOLIC PANEL
ALT: 30 U/L (ref 0–44)
AST: 20 U/L (ref 15–41)
Albumin: 3.1 g/dL — ABNORMAL LOW (ref 3.5–5.0)
Alkaline Phosphatase: 55 U/L (ref 38–126)
Anion gap: 10 (ref 5–15)
BUN: 27 mg/dL — ABNORMAL HIGH (ref 8–23)
CO2: 29 mmol/L (ref 22–32)
Calcium: 9 mg/dL (ref 8.9–10.3)
Chloride: 95 mmol/L — ABNORMAL LOW (ref 98–111)
Creatinine, Ser: 1.61 mg/dL — ABNORMAL HIGH (ref 0.61–1.24)
GFR calc Af Amer: 47 mL/min — ABNORMAL LOW (ref >60)
GFR calc non Af Amer: 41 mL/min — ABNORMAL LOW (ref >60)
Glucose, Bld: 275 mg/dL — ABNORMAL HIGH (ref 70–99)
Potassium: 4.4 mmol/L (ref 3.5–5.1)
Sodium: 134 mmol/L — ABNORMAL LOW (ref 135–145)
Total Bilirubin: 0.8 mg/dL (ref 0.3–1.2)
Total Protein: 5.3 g/dL — ABNORMAL LOW (ref 6.5–8.1)

## 2018-12-02 LAB — CBC WITH DIFFERENTIAL/PLATELET
Abs Immature Granulocytes: 0 10*3/uL (ref 0.00–0.07)
Basophils Absolute: 0 10*3/uL (ref 0.0–0.1)
Basophils Relative: 1 %
Eosinophils Absolute: 0.3 10*3/uL (ref 0.0–0.5)
Eosinophils Relative: 8 %
HCT: 40.4 % (ref 39.0–52.0)
Hemoglobin: 13.5 g/dL (ref 13.0–17.0)
Immature Granulocytes: 0 %
Lymphocytes Relative: 32 %
Lymphs Abs: 1.2 10*3/uL (ref 0.7–4.0)
MCH: 31.1 pg (ref 26.0–34.0)
MCHC: 33.4 g/dL (ref 30.0–36.0)
MCV: 93.1 fL (ref 80.0–100.0)
Monocytes Absolute: 0.7 10*3/uL (ref 0.1–1.0)
Monocytes Relative: 19 %
Neutro Abs: 1.5 10*3/uL — ABNORMAL LOW (ref 1.7–7.7)
Neutrophils Relative %: 40 %
Platelets: 132 10*3/uL — ABNORMAL LOW (ref 150–400)
RBC: 4.34 MIL/uL (ref 4.22–5.81)
RDW: 15.5 % (ref 11.5–15.5)
WBC: 3.8 10*3/uL — ABNORMAL LOW (ref 4.0–10.5)
nRBC: 0 % (ref 0.0–0.2)

## 2018-12-02 LAB — GLUCOSE, CAPILLARY
Glucose-Capillary: 184 mg/dL — ABNORMAL HIGH (ref 70–99)
Glucose-Capillary: 178 mg/dL — ABNORMAL HIGH (ref 70–99)
Glucose-Capillary: 173 mg/dL — ABNORMAL HIGH (ref 70–99)
Glucose-Capillary: 158 mg/dL — ABNORMAL HIGH (ref 70–99)

## 2018-12-02 MED ORDER — IPRATROPIUM-ALBUTEROL 0.5-2.5 (3) MG/3ML IN SOLN
3.0000 mL | Freq: Three times a day (TID) | RESPIRATORY_TRACT | Status: DC
  Administered 2018-12-02 – 2018-12-04 (×6): 3 mL via RESPIRATORY_TRACT
  Filled 2018-12-02 (×5): qty 3

## 2018-12-02 MED ORDER — ALBUTEROL SULFATE (2.5 MG/3ML) 0.083% IN NEBU: 2.5000 mg | INHALATION_SOLUTION | RESPIRATORY_TRACT | Status: DC | PRN

## 2018-12-02 MED ORDER — PRO-STAT SUGAR FREE PO LIQD
30.0000 mL | Freq: Two times a day (BID) | ORAL | Status: DC
  Administered 2018-12-02 – 2018-12-16 (×27): 30 mL via ORAL
  Filled 2018-12-02 (×27): qty 30

## 2018-12-02 MED ORDER — ENOXAPARIN SODIUM 60 MG/0.6ML ~~LOC~~ SOLN
55.0000 mg | SUBCUTANEOUS | Status: DC
  Administered 2018-12-02 – 2018-12-16 (×15): 55 mg via SUBCUTANEOUS
  Filled 2018-12-02 (×15): qty 0.55

## 2018-12-02 NOTE — Progress Notes (Signed)
Social Work Assessment and Plan   Patient Details  Name: Andrew Larsen MRN: [id number] Date of Birth: [birth year]  Today's Date: 12/02/2018  Problem List:  Patient Active Problem List   Diagnosis Date Noted  . Physical debility 12/01/2018  . Pressure injury of skin 11/27/2018  . Acute respiratory failure with hypoxia ([place])   . 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 Acute respiratory disease due to COVID-19 virus 11/05/2018  . COVID-19 virus infection 11/03/2018  . CKD (chronic Larsen disease) stage 3, GFR 30-59 ml/min (HCC) 11/03/2018  . Type 2 diabetes mellitus ([place]) 11/03/2018  . Essential hypertension 11/03/2018  . Claudication in peripheral vascular disease ([place]) 06/10/2016  . Morbid obesity ([place]) 09/21/2015  . Upper airway cough syndrome 09/20/2015  . Cough variant asthma 09/20/2015   Past Medical History:  Past Medical History:  Diagnosis Date  . CKD (chronic Larsen disease)    CKD stage III (01/2018)  . Daytime somnolence   . Diabetes mellitus without complication ([place])   . Fracture    right fibula/wears boot cast  . GERD (gastroesophageal reflux disease)   . Glaucoma   . Hypercholesteremia   . Hyperlipidemia   . Hypertension   . Mold exposure   . PAD (peripheral artery disease) ([place])   . Sleep apnea    Past Surgical History:  Past Surgical History:  Procedure Laterality Date  . LOWER EXTREMITY ANGIOGRAPHY N/A 06/12/2016   Procedure: Lower Extremity Angiography;  Surgeon: Andrew Prows, MD;  Location: [facility];  Service: Cardiovascular;  Laterality: N/A;  . MUSCLE BIOPSY Right 05/06/2018   Procedure: right vastus lateralis muscle biopsy;  Surgeon: Andrew Larsson, MD;  Location: [facility];  Service: Neurosurgery;  Laterality: Right;  . ORIF ANKLE FRACTURE Right 10/02/2012   Procedure: OPEN REDUCTION INTERNAL FIXATION (ORIF) ANKLE FRACTURE;  Surgeon: Andrew Pel, MD;  Location: [facility];  Service: Orthopedics;  Laterality: Right;  . PERIPHERAL VASCULAR  ATHERECTOMY Left 06/12/2016   Procedure: Peripheral Vascular Atherectomy-Left Popliteal;  Surgeon: Andrew Prows, MD;  Location: [facility];  Service: Cardiovascular;  Laterality: Left;  . PERIPHERAL VASCULAR INTERVENTION Left 06/12/2016   Procedure: Peripheral Vascular Intervention- DCB Left Popliteal;  Surgeon: Andrew Prows, MD;  Location: [facility];  Service: Cardiovascular;  Laterality: Left;   Social History:  reports that he quit smoking about 37 years ago. He has a 20.00 pack-year smoking history. He has never used smokeless tobacco. He reports that he does not drink alcohol or use drugs.  Family / Support Systems Marital Status: Widow/Widower Patient Roles: Parent Children: Andrew Larsen-daughter [phone] Other Supports: Son in-law Anticipated Caregiver: Daughter and son in-law Ability/Limitations of Caregiver: Daughter has been working from home since Runner, broadcasting/film/video for [employer] Caregiver Availability: 24/7 Family Dynamics: Close knit with his daughter and extended family. He has many friends and church members who are rooting for him and hoping he does well and is home soon.  Social History Preferred language: English Religion: Non-Denominational Cultural Background: No issues Education: HS Read: Yes Write: Yes Employment Status: Retired Public relations account executive Issues: No issues Guardian/Conservator: None-according to MD pt is capable of making his own decisions while here. Will include daughter per pt's choice   Abuse/Neglect Abuse/Neglect Assessment Can Be Completed: Yes Physical Abuse: Denies Verbal Abuse: Denies Sexual Abuse: Denies Exploitation of patient/patient's resources: Denies Self-Neglect: Denies  Emotional Status Pt's affect, behavior and adjustment status: Pt is motivated to do wel and recover from [place]. He is wiling to do  whatever it takes to regain his independence from this. He has always been independent and taken care of  himself, but was falling prior to admission and was going to start OPPT before admission Recent Psychosocial Issues: other health issues Psychiatric History: No history deferred depression screening due to adjusting well to the new unit and glad to be here. Do feel he would benefit from seeing neuro-psych while here for coping. Will make referral Substance Abuse History: No issues  Patient / Family Perceptions, Expectations & Goals Pt/Family understanding of illness & functional limitations: Pt can explain his diagnosis and what has happened to him. He talks to the MD daily and feels his questions are being answered and feels much better than he did. Daughter also talks with the MD and feels she has a good understanding of his plan going forward. Premorbid pt/family roles/activities: Father, retiree, church member, friend, etc Anticipated changes in roles/activities/participation: resume Pt/family expectations/goals: Pt states: " I hope to get back to being able to do for myself before I leave."  Andrew Larsen states: " I am hopeful and glad he is on rehab now."  [name]: None Premorbid Home Care/DME Agencies: Other (Comment)(was to start OP prior to admission for frequent falls) Transportation available at discharge: Daughter and son in-law Resource referrals recommended: Neuropsychology, Support group (specify)  Discharge Planning Living Arrangements: Children, Other relatives Support Systems: Children, Other relatives, Friends/neighbors, Church/faith community Type of Residence: Private residence Insurance Resources: Multimedia programmer (specify)([employer]) Museum/gallery curator Resources: Fish farm manager, Family Support Financial Screen Referred: No Living Expenses: Lives with family Money Management: Family Does the patient have any problems obtaining your medications?: No Home Management: Daughter Patient/Family Preliminary Plans: Return home with daughter and son  in-law, daughter is currently working from home and can provide assist. Both are hopeful he will do well here and leave at a level where he will not need much physcial assist. Will await therapy team evaluaitons and work on discharge needs. Social Work Anticipated Follow Up Needs: HH/OP, Support Group  Clinical Impression Pleasant gentleman who is motivated to be here and to regain his independence again. His family is very involved and supportive and willing to assist him at discharge. Do feel he would benefit from seeing neuro-psych while here. Will await therapy team evaluations and work on discharge needs.  Elease Hashimoto 12/02/2018, 10:17 AM

## 2018-12-02 NOTE — Progress Notes (Signed)
Patient information reviewed and entered into [facility] System by Becky Sully Dyment, PPS coordinator. Information including medical coding, function ability, and quality indicators will be reviewed and updated through discharge.   

## 2018-12-02 NOTE — Progress Notes (Signed)
[facility] PHYSICAL MEDICINE & REHABILITATION PROGRESS NOTE  Subjective/Complaints: Patient seen laying in bed this AM working with therapies.  He states he slept fairly overnight.  He is ready to begin therapies.   ROS: Denies CP, SOB, N/V/D.  Objective: Vital Signs: Blood pressure 114/63, pulse 94, temperature 98.5 F (36.9 C), temperature source Oral, resp. rate 20, height 5\' 11"  (1.803 m), weight 111.6 kg, SpO2 96 %. No results found. Recent Labs    12/02/18 0909  WBC 3.8*  HGB 13.5  HCT 40.4  PLT 132*   Recent Labs    12/02/18 0909  NA 134*  K 4.4  CL 95*  CO2 29  GLUCOSE 275*  BUN 27*  CREATININE 1.61*  CALCIUM 9.0    Physical Exam: BP 114/63 (BP Location: Left Arm)   Pulse 94   Temp 98.5 F (36.9 C) (Oral)   Resp 20   Ht 5\' 11"  (1.803 m)   Wt 111.6 kg   SpO2 96%   BMI 34.31 kg/m  Constitutional: No distress . Vital signs reviewed. Obese HENT: Normocephalic.  Atraumatic. Eyes: EOMI. No discharge. Cardiovascular: No JVD. Respiratory: Normal effort.  No stridor. +[place] GI: Non-distended. Skin: Warm and dry.  Intact. Psych: Normal mood.  Normal behavior. Musc: B/l LE edema Neurological: Alert and oriented Motor: B/l UE 4+/5 proximal to distal LLE: 4/5  RLE: 4-/5 HF, KE and 4/5 ADF/PF.  Skin: Skin iswarmand dry.  Psychiatric: He has anormal mood and affect. Hisbehavior is normal.  Assessment/Plan: 1. Functional deficits secondary to debility which require 3+ hours per day of interdisciplinary therapy in a comprehensive inpatient rehab setting.  Physiatrist is providing close team supervision and 24 hour management of active medical problems listed below.  Physiatrist and rehab team continue to assess barriers to discharge/monitor patient progress toward functional and medical goals  Care Tool:  Bathing              Bathing assist       Upper Body Dressing/Undressing Upper body dressing        Upper body assist      Lower Body  Dressing/Undressing Lower body dressing            Lower body assist       Toileting Toileting    Toileting assist Assist for toileting: Independent with assistive device Assistive Device Comment: urinal   Transfers Chair/bed transfer  Transfers assist           Locomotion Ambulation   Ambulation assist              Walk 10 feet activity   Assist           Walk 50 feet activity   Assist           Walk 150 feet activity   Assist           Walk 10 feet on uneven surface  activity   Assist           Wheelchair     Assist               Wheelchair 50 feet with 2 turns activity    Assist            Wheelchair 150 feet activity     Assist            Medical Problem List and Plan: 1.Functional and mobility deficitssecondary to debility after ARDS, COVID  Begin CIR evaluations 2. Antithrombotics: -DVT/anticoagulation:Pharmaceutical:Lovenox -antiplatelet  therapy: ASA 81 3. Pain Management:tylenol prn 4. Mood:LCSW to follow for evaluation and support. -antipsychotic agents: N/a 5. Neuropsych: This patientiscapable of making decisions onhisown behalf. 6. Skin/Wound Care:Air mattress overlay. Local care to sacral decub. 7. Fluids/Electrolytes/Nutrition:Monitor I/Os.  8. HTN: Monitor BP   Monitor with increased mobility 9. CKD III:   Cr. 1.61 on 8/4  Cont to monitor 9. T2DM with hyperglycemia: Hgb A1C- 7.3.   Monitor with increased mobility  10. CAD: Continue Lasix and lopressor.  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  12. OSA: Question ability to transition to CPAP 13. Thrombocytopenia:   Plts 132 on 8/4  Cont to monitor 14. Hyponatremia:   Sodium 134 on 8/4  Cont to monitor 15. Cough/Hypoxia: Encourage IS.   Wean supplemental oxygen as  tolerated Donebs QID with cough medications prn. 16. Hypoalbuminemia  Supplement initiated on 8/4 17. Leukopenia  WBCs 3.8 on 8/4  Cont to monitor  LOS: 1 days A FACE TO FACE EVALUATION WAS PERFORMED  Andrew Larsen Andrew Larsen 12/02/2018, 10:18 AM

## 2018-12-02 NOTE — Care Management Note (Signed)
[facility] Individual Statement of Services  Patient Name:  Andrew Larsen  Date:  12/02/2018  Welcome to the [facility].  Our goal is to provide you with an individualized program based on your diagnosis and situation, designed to meet your specific needs.  With this comprehensive rehabilitation program, you will be expected to participate in at least 3 hours of rehabilitation therapies Monday-Friday, with modified therapy programming on the weekends.  Your rehabilitation program will include the following services:  Physical Therapy (PT), Occupational Therapy (OT), Speech Therapy (ST), 24 hour per day rehabilitation nursing, Therapeutic Recreaction (TR), Neuropsychology, Case Management (Social Worker), Rehabilitation Medicine, Nutrition Services and Pharmacy Services  Weekly team conferences will be held on Wednesday to discuss your progress.  Your Social Worker will talk with you frequently to get your input and to update you on team discussions.  Team conferences with you and your family in attendance may also be held.  Expected length of stay: 12-16 days  Overall anticipated outcome: independent with device  Depending on your progress and recovery, your program may change. Your Social Worker will coordinate services and will keep you informed of any changes. Your Social Worker's name and contact numbers are listed  below.  The following services may also be recommended but are not provided by the [facility]:    [facility] will be made to provide these services after discharge if needed.  Arrangements include referral to agencies that provide these services.  Your insurance has been verified to be:  [employer] Your primary doctor is:  Velna Hatchet  Pertinent information will be shared with your doctor and your insurance company.  Social  Worker:  Ovidio Kin, [place] or ([phone]  Information discussed with and copy given to patient by: Elease Hashimoto, 12/02/2018, 10:19 AM

## 2018-12-02 NOTE — Progress Notes (Addendum)
Physical Therapy Session Note  Patient Details  Name: Andrew Larsen MRN: [id number] Date of Birth: [birth year]  Today's Date: 12/02/2018  PT Individual Time: 1405-1510 PT Individual Time Calculation (min): 65 min   Short Term Goals: Week 1:  PT Short Term Goal 1 (Week 1): Pt will ambulate 36' w/ LRAD w/ supevision PT Short Term Goal 2 (Week 1): Pt will initiate stair training PT Short Term Goal 3 (Week 1): Pt will participate in 30 min of upright mobility w/o increase in fatigue PT Short Term Goal 4 (Week 1): Pt will maintain dynamic standing balance w/ supervision  PM Session Pain:  Pt denies pain this pm   Gait: 72ft w/RW and cga on 4L 02 via [place], HR 119, 02 sats 77%.  02 sats after 5 min 84%.  Increased 02 to 6L and 02 sats quickly increased to 97%.  83ft w/RW CGA on 6L 02 via [place], HR 96, 02sats 96% and slowly declined to 85%.  After 6 min returned to 95%.     Educated pt regarding deep breathing vs "panting" and mouth breathing. As pt was tending to do this w/exertion.     Therapeutic Activity: Car transfer perfomred as part of [employer] assessment.  wc to/from car w/min assist and verbal cues.  HR 122, 02 sats 78% on 6L 02 via .  Encouraged deep breathing.  02 sats slowly returned to 88%, HR 112.  wc to bed via stand pivot w/min assist for balance and cues for safety.  Sit to supine w/cga.  02 Sats decreased to 72% and very slow to return to 85% (over 10 min on 6L)  Nursing called and informed and agreed to monitor and gradually decrease 02 as tolerated by pt.      Assessment: Pt w/very poor tolerance for physical activity w/elevated HR and 02 satruation decreasing to low 70s and slow to recover despite increasing supplemental 02 to 6L during session.  Will need to be closely monitored during rx.  Requires frequent prolonged recovery between exertion.      Therapy Documentation Precautions:  Precautions Precautions: Fall Precaution Comments: watch O2 sats, attempt to  wean Restrictions Weight Bearing Restrictions: No     Therapy/Group: Individual Therapy  Callie Fielding, PT  12/02/2018, 4:33 PM

## 2018-12-02 NOTE — IPOC Note (Signed)
Individualized overall Plan of Care (IPOC) Patient Details Name: Andrew Larsen MRN: [id number] DOB: 1940-06-27  Admitting Diagnosis: Physical debility  Hospital Problems: Principal Problem:   Physical debility Active Problems:   Leukopenia   Hypoalbuminemia due to protein-calorie malnutrition ([place])   Supplemental oxygen dependent   Hyponatremia   Thrombocytopenia ([place])   Controlled type 2 diabetes mellitus with hyperglycemia (HCC)   CKD (chronic kidney disease), stage III (HCC)   Benign essential HTN     Functional Problem List: Nursing Edema, Endurance, Safety, Skin Integrity  PT Balance, Endurance, Safety  OT Balance, Endurance, Safety  SLP    TR         Basic ADL's: OT Grooming, Bathing, Dressing, Toileting     Advanced  ADL's: OT       Transfers: PT Bed to Chair, Bed Mobility, Floor, Car, Manufacturing systems engineer, Metallurgist: PT Stairs, Ambulation     Additional Impairments: OT None  SLP        TR      Anticipated Outcomes Item Anticipated Outcome  Self Feeding    Swallowing      Basic self-care  Supervision  Toileting  Mod I   Bathroom Transfers Mod I toilet, Supervision shower  Bowel/Bladder  Patient to continue to be continent of bowel and bladder during admission  Transfers  mod/i  Locomotion  mod/i household gait w/ LRAD  Communication     Cognition     Pain  Patient will be pain free or pain less than 3 during admission  Safety/Judgment  Patient to be free from falls and adhere to safety plan during admission   Therapy Plan: PT Intensity: Minimum of 1-2 x/day ,45 to 90 minutes PT Frequency: 5 out of 7 days PT Duration Estimated Length of Stay: 12-16 days OT Intensity: Minimum of 1-2 x/day, 45 to 90 minutes OT Frequency: 5 out of 7 days OT Duration/Estimated Length of Stay: 12-16 days      Team Interventions: Nursing Interventions Patient/Family Education, Disease Management/Prevention, Skin Care/Wound  Management, Psychosocial Support  PT interventions Ambulation/gait training, Discharge planning, DME/adaptive equipment instruction, Functional mobility training, Pain management, Splinting/orthotics, Psychosocial support, Therapeutic Activities, UE/LE Strength taining/ROM, UE/LE Coordination activities, Therapeutic Exercise, Patient/family education, Skin care/wound management, Stair training, Neuromuscular re-education, Functional electrical stimulation, Disease management/prevention, Academic librarian, Training and development officer  OT Interventions Training and development officer, Academic librarian, Discharge planning, Disease mangement/prevention, Engineer, drilling, Functional mobility training, Pain management, Patient/family education, Psychosocial support, Self Care/advanced ADL retraining, Therapeutic Activities, Therapeutic Exercise, UE/LE Strength taining/ROM  SLP Interventions    TR Interventions    SW/CM Interventions Discharge Planning, Psychosocial Support, Patient/Family Education   Barriers to Discharge MD  Medical stability, Weight and New oxygen  Nursing      PT Medical stability, New oxygen    OT      SLP      SW       Team Discharge Planning: Destination: PT-Home ,OT- Home , SLP-  Projected Follow-up: PT-Home health PT, OT-  Home health OT, 24 hour supervision/assistance, SLP-  Projected Equipment Needs: PT-To be determined, OT- Tub/shower bench, SLP-  Equipment Details: PT-has RW and SPC already, OT-  Patient/family involved in discharge planning: PT- Patient,  OT-Patient, SLP-   MD ELOS: 12-15 days. Medical Rehab Prognosis:  Good Assessment:  78 year old male with history of CKD, PAD, HTN, T2DM, OSA , [name] body myos[facility]; who was admitted on 11/03/18 with nonproductive cough  and SOB. He had been exposed to multiple family members with COVID and found out that his outpatient test was positive.  He was admitted for observation and CXR revealed bilateral interstitial prominence due to pneumonitis v/s CHF. He continued to decline and was treated with Remdesivir, Actemra, steroids and convalescent plasma. He was monitored in ICU due to high oxygen up to 60% HFNC/ 35 L --PCCM recommended goal at rest to be SAO2 >85%. Fluid overload treated with intermittent IV diuresis. He has improved with supportive care and has been weaned down to 4- 6 L oxygen per [place]. Therapy ongoing and patient noted to be limited by bouts of coughing, DOE/hypoxia, BLE weakness as well as debility. Will set goals for Mod I with PT/OT.  Due to the current state of emergency, patients may not be receiving their 3-hours of Medicare-mandated therapy.  See Team Conference Notes for weekly updates to the plan of care

## 2018-12-02 NOTE — Evaluation (Signed)
Occupational Therapy Assessment and Plan  Patient Details  Name: Andrew Larsen MRN: [id number] Date of Birth: 1940/05/15  OT Diagnosis: muscular wasting and disuse atrophy and muscle weakness (generalized) Rehab Potential: Rehab Potential (ACUTE ONLY): Good ELOS: 12-16 days   Today's Date: 12/02/2018 OT Individual Time: 3299-2426 OT Individual Time Calculation (min): 70 min     Problem List:  Patient Active Problem List   Diagnosis Date Noted  . Leukopenia   . Hypoalbuminemia due to protein-calorie malnutrition ([place])   . Supplemental oxygen dependent   . Hyponatremia   . Thrombocytopenia ([place])   . Controlled type 2 diabetes mellitus with hyperglycemia ([place])   . CKD (chronic kidney disease), stage III ([place])   . Benign essential HTN   . Physical debility 12/01/2018  . Pressure injury of skin 11/27/2018  . Acute respiratory failure with hypoxia ([place])   . 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 Acute respiratory disease due to COVID-19 virus 11/05/2018  . COVID-19 virus infection 11/03/2018  . CKD (chronic kidney disease) stage 3, GFR 30-59 ml/min (HCC) 11/03/2018  . Type 2 diabetes mellitus ([place]) 11/03/2018  . Essential hypertension 11/03/2018  . Claudication in peripheral vascular disease ([place]) 06/10/2016  . Morbid obesity ([place]) 09/21/2015  . Upper airway cough syndrome 09/20/2015  . Cough variant asthma 09/20/2015    Past Medical History:  Past Medical History:  Diagnosis Date  . CKD (chronic kidney disease)    CKD stage III (01/2018)  . Daytime somnolence   . Diabetes mellitus without complication ([place])   . Fracture    right fibula/wears boot cast  . GERD (gastroesophageal reflux disease)   . Glaucoma   . Hypercholesteremia   . Hyperlipidemia   . Hypertension   . Mold exposure   . PAD (peripheral artery disease) ([place])   . Sleep apnea    Past Surgical History:  Past Surgical History:  Procedure Laterality Date  . LOWER EXTREMITY ANGIOGRAPHY N/A 06/12/2016    Procedure: Lower Extremity Angiography;  Surgeon: Adrian Prows, MD;  Location: [facility];  Service: Cardiovascular;  Laterality: N/A;  . MUSCLE BIOPSY Right 05/06/2018   Procedure: right vastus lateralis muscle biopsy;  Surgeon: Earnie Larsson, MD;  Location: [facility];  Service: Neurosurgery;  Laterality: Right;  . ORIF ANKLE FRACTURE Right 10/02/2012   Procedure: OPEN REDUCTION INTERNAL FIXATION (ORIF) ANKLE FRACTURE;  Surgeon: Meredith Pel, MD;  Location: [facility];  Service: Orthopedics;  Laterality: Right;  . PERIPHERAL VASCULAR ATHERECTOMY Left 06/12/2016   Procedure: Peripheral Vascular Atherectomy-Left Popliteal;  Surgeon: Adrian Prows, MD;  Location: [facility];  Service: Cardiovascular;  Laterality: Left;  . PERIPHERAL VASCULAR INTERVENTION Left 06/12/2016   Procedure: Peripheral Vascular Intervention- DCB Left Popliteal;  Surgeon: Adrian Prows, MD;  Location: [facility];  Service: Cardiovascular;  Laterality: Left;    Assessment & Plan Clinical Impression: Patient is a 78 y.o. year old male with history of CKD, PAD, HTN, T2DM, OSA , [facility] at [facility] withdiagnosis of inclusion body myositis; who was admitted on 11/03/18 with nonproductive cough and SOB. He had been exposed to multiple family members with COVID and found out that his outpatient test was positive. He was admitted for observation and CXR revealed bilateral interstitial prominence due to pneumonitis v/s CHF. He continued to decline and was treated with Remdesivir, Actemra, steroids and convalescent plasma. He was monitored in ICU due to high oxygen up to 60% HFNC/ 35 L --PCCM recommended goal at rest to be SAO2 >  85%. Fluid overload treated with intermittent IV diuresis. He has improved with supportive care and has been weaned down to 4- 6 L oxygen per [place]. Therapy ongoing and patient noted to be limited by bouts of coughing, DOE/hypoxia, BLE weakness as well as debility. CIR recommended due to functional  decline.    Patient transferred to CIR on 12/01/2018 .    Patient currently requires min-mod with basic self-care skills secondary to muscle weakness, decreased cardiorespiratoy endurance and decreased oxygen support and decreased standing balance and decreased balance strategies.  Prior to hospitalization, patient could complete ADLs with independent .  Patient will benefit from skilled intervention to decrease level of assist with basic self-care skills and increase independence with basic self-care skills prior to discharge home with care partner.  Anticipate patient will require intermittent supervision and follow up home health.  OT - End of Session Activity Tolerance: Tolerates 30+ min activity with multiple rests Endurance Deficit: Yes Endurance Deficit Description: required frequent rest breaks with O2 dropping to 83% on 6L during activity OT Assessment Rehab Potential (ACUTE ONLY): Good OT Patient demonstrates impairments in the following area(s): Balance;Endurance;Safety OT Basic ADL's Functional Problem(s): Grooming;Bathing;Dressing;Toileting OT Transfers Functional Problem(s): Toilet;Tub/Shower OT Additional Impairment(s): None OT Plan OT Intensity: Minimum of 1-2 x/day, 45 to 90 minutes OT Frequency: 5 out of 7 days OT Duration/Estimated Length of Stay: 12-16 days OT Treatment/Interventions: Balance/vestibular training;Community reintegration;Discharge planning;Disease mangement/prevention;DME/adaptive equipment instruction;Functional mobility training;Pain management;Patient/family education;Psychosocial support;Self Care/advanced ADL retraining;Therapeutic Activities;Therapeutic Exercise;UE/LE Strength taining/ROM OT Basic Self-Care Anticipated Outcome(s): Supervision OT Toileting Anticipated Outcome(s): Mod I OT Bathroom Transfers Anticipated Outcome(s): Mod I toilet, Supervision shower OT Recommendation Recommendations for Other Services: Therapeutic Recreation  consult Therapeutic Recreation Interventions: (return to leisure pursuits) Patient destination: Home Follow Up Recommendations: Home health OT;24 hour supervision/assistance Equipment Recommended: Tub/shower bench   Skilled Therapeutic Intervention OT eval completed with discussion of rehab process, OT purpose, POC, ELOS, and goals. ADL assessment completed with bathing and dressing at sit > stand level at sink.  Pt required frequent rest breaks throughout session due to decreased O2 sats with functional activity.  O2 sats dropping to 82% on 4LO2 during bathing, therefore bumped up to 6L (O2 tank does not have a 5L setting) with pt still requiring frequent rest breaks and cues for pursed lip breathing to maintain sats > 85%.  Min assist sit > stand for therapist to wash buttocks while pt maintained standing with BUE support on sink.  Pt able to assist with pulling pants over hips, progressing to alternating UE support on sink.  Pt motivated to remain upright in w/c to eat lunch despite reports of fatigue from being up since PT session.  OT Evaluation Precautions/Restrictions  Precautions Precautions: Fall Precaution Comments: watch O2 sats, attempt to wean Restrictions Weight Bearing Restrictions: No Vital Signs Oxygen Therapy SpO2: 96 % O2 Device: Nasal Cannula O2 Flow Rate (L/min): 4 L/min Pain Pain Assessment Pain Scale: 0-10 Pain Score: 0-No pain Home Living/Prior Functioning Home Living Family/patient expects to be discharged to:: Private residence Living Arrangements: Children, Other relatives Available Help at Discharge: Available 24 hours/day Type of Home: House Home Access: Stairs to enter [employer] of Steps: 4-5 steps to enter home via garage Home Layout: Two level, Able to live on main level with bedroom/bathroom Bathroom Shower/Tub: Chiropodist: Standard Bathroom Accessibility: Yes Additional Comments: was to start outpt therapy prior  to admission as referred by his neurologist  Lives With: Daughter IADL History Homemaking Responsibilities: No(did pay  personal bills) Current License: Yes Prior Function Level of Independence: Independent with basic ADLs, Independent with gait, Independent with transfers, Requires assistive device for independence  Able to Take Stairs?: Yes(had some difficulty, but able to perform without assist) Driving: Yes 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 Comments: 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), had been using SPC and RW over last few months 2/2 BLE weakness ADL ADL Grooming: Contact guard Where Assessed-Grooming: Sitting at sink Upper Body Bathing: Supervision/safety, Setup Where Assessed-Upper Body Bathing: Sitting at sink Lower Body Bathing: Moderate assistance Where Assessed-Lower Body Bathing: Sitting at sink, Standing at sink Upper Body Dressing: Minimal assistance Where Assessed-Upper Body Dressing: Sitting at sink Lower Body Dressing: Moderate assistance Where Assessed-Lower Body Dressing: Sitting at sink, Standing at sink Toileting: Minimal assistance Toilet Transfer: Minimal assistance Toilet Transfer Method: Stand pivot Vision Baseline Vision/History: Wears glasses Wears Glasses: At all times Patient Visual Report: No change from baseline Vision Assessment?: No apparent visual deficits Cognition Overall Cognitive Status: Within Functional Limits for tasks assessed Arousal/Alertness: Awake/alert Orientation Level: Person;Place;Situation Person: Oriented Place: Oriented Situation: Oriented Year: 2020 Month: August Day of Week: Correct Memory: Appears intact Immediate Memory Recall: Sock;Blue;Bed Memory Recall Sock: Without Cue Memory Recall Blue: Without Cue Memory Recall Bed: Without Cue Attention: Selective;Alternating Selective Attention: Appears intact Awareness: Appears intact Problem  Solving: Appears intact Safety/Judgment: Appears intact Sensation Sensation Light Touch: Impaired by gross assessment(reports diminished sensation w/ B feet, reports it has been going on for years, pt states it's because his feet are cold) Proprioception: Appears Intact Coordination Fine Motor Movements are Fluid and Coordinated: Yes Finger Nose Finger Test: WNL Extremity/Trunk Assessment RUE Assessment RUE Assessment: Within Functional Limits General Strength Comments: grossly 4+/5 LUE Assessment LUE Assessment: Within Functional Limits General Strength Comments: grossly 4+/5     Refer to Care Plan for Long Term Goals  Recommendations for other services: Therapeutic Recreation  Other leisure pursuits   Discharge Criteria: Patient will be discharged from OT if patient refuses treatment 3 consecutive times without medical reason, if treatment goals not met, if there is a change in medical status, if patient makes no progress towards goals or if patient is discharged from hospital.  The above assessment, treatment plan, treatment alternatives and goals were discussed and mutually agreed upon: by patient  Ellwood Dense [facility] 12/02/2018, 11:47 AM

## 2018-12-02 NOTE — Evaluation (Signed)
Physical Therapy Assessment and Plan  Patient Details  Name: Andrew Larsen MRN: [id number] Date of Birth: 05-13-1940  PT Diagnosis: Difficulty walking and Muscle weakness Rehab Potential: Good ELOS: 12-16 days   Today's Date: 12/02/2018 PT Individual Time: 0805-0900 PT Individual Time Calculation (min): 55 min    Problem List:  Patient Active Problem List   Diagnosis Date Noted  . Physical debility 12/01/2018  . Pressure injury of skin 11/27/2018  . Acute respiratory failure with hypoxia ([place])   . 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 Acute respiratory disease due to COVID-19 virus 11/05/2018  . COVID-19 virus infection 11/03/2018  . CKD (chronic kidney disease) stage 3, GFR 30-59 ml/min (HCC) 11/03/2018  . Type 2 diabetes mellitus ([place]) 11/03/2018  . Essential hypertension 11/03/2018  . Claudication in peripheral vascular disease ([place]) 06/10/2016  . Morbid obesity ([place]) 09/21/2015  . Upper airway cough syndrome 09/20/2015  . Cough variant asthma 09/20/2015    Past Medical History:  Past Medical History:  Diagnosis Date  . CKD (chronic kidney disease)    CKD stage III (01/2018)  . Daytime somnolence   . Diabetes mellitus without complication ([place])   . Fracture    right fibula/wears boot cast  . GERD (gastroesophageal reflux disease)   . Glaucoma   . Hypercholesteremia   . Hyperlipidemia   . Hypertension   . Mold exposure   . PAD (peripheral artery disease) ([place])   . Sleep apnea    Past Surgical History:  Past Surgical History:  Procedure Laterality Date  . LOWER EXTREMITY ANGIOGRAPHY N/A 06/12/2016   Procedure: Lower Extremity Angiography;  Surgeon: Adrian Prows, MD;  Location: [facility];  Service: Cardiovascular;  Laterality: N/A;  . MUSCLE BIOPSY Right 05/06/2018   Procedure: right vastus lateralis muscle biopsy;  Surgeon: Earnie Larsson, MD;  Location: [facility];  Service: Neurosurgery;  Laterality: Right;  . ORIF ANKLE FRACTURE Right 10/02/2012   Procedure: OPEN  REDUCTION INTERNAL FIXATION (ORIF) ANKLE FRACTURE;  Surgeon: Meredith Pel, MD;  Location: [facility];  Service: Orthopedics;  Laterality: Right;  . PERIPHERAL VASCULAR ATHERECTOMY Left 06/12/2016   Procedure: Peripheral Vascular Atherectomy-Left Popliteal;  Surgeon: Adrian Prows, MD;  Location: [facility];  Service: Cardiovascular;  Laterality: Left;  . PERIPHERAL VASCULAR INTERVENTION Left 06/12/2016   Procedure: Peripheral Vascular Intervention- DCB Left Popliteal;  Surgeon: Adrian Prows, MD;  Location: [facility] [place];  Service: Cardiovascular;  Laterality: Left;    Assessment & Plan Clinical Impression: Patient is a 78 year old male with history of CKD, PAD, HTN, T2DM, OSA , recent evaluation at [facility] with diagnosis of inclusion body myositis; who was admitted on 11/03/18 with nonproductive cough and SOB. He had been exposed to multiple family members with COVID and found out that his outpatient test was positive.  He was admitted for observation and CXR revealed bilateral interstitial prominence due to pneumonitis v/s CHF. He continued to decline and was treated with Remdesivir,  Actemra, steroids and convalescent plasma.  He was monitored in ICU due to high oxygen up to 60% HFNC/ 35 L  --PCCM recommended goal at rest to be SAO2 > 85%.  Fluid overload treated with intermittent IV diuresis. He has improved with supportive care and has been weaned down to  4- 6 L oxygen per [place]. Therapy ongoing and patient noted to be limited by bouts of coughing, DOE/hypoxia,  BLE weakness as well as debility. CIR recommended due to functional decline. Patient transferred to CIR  on 12/01/2018 .   Patient currently requires min with mobility secondary to muscle weakness, decreased cardiorespiratoy endurance and decreased oxygen support and decreased standing balance and decreased balance strategies.  Prior to hospitalization, patient was modified independent  with mobility and lived with Daughter in a House home.  Home  access is 4-5 steps to enter home via garageStairs to enter.  Patient will benefit from skilled PT intervention to maximize safe functional mobility, minimize fall risk and decrease caregiver burden for planned discharge home with 24 hour supervision.  Anticipate patient will benefit from follow up [place] at discharge.  PT - End of Session Activity Tolerance: Tolerates < 10 min activity with changes in vital signs Endurance Deficit: Yes Endurance Deficit Description: decreased, moderate increase in work of breathing w/ all activities PT Assessment Rehab Potential (ACUTE/IP ONLY): Good PT Barriers to Discharge: Medical stability;New oxygen PT Patient demonstrates impairments in the following area(s): Balance;Endurance;Safety PT Transfers Functional Problem(s): Bed to Chair;Bed Mobility;Floor;Car;Furniture PT Locomotion Functional Problem(s): Stairs;Ambulation PT Plan PT Intensity: Minimum of 1-2 x/day ,45 to 90 minutes PT Frequency: 5 out of 7 days PT Treatment/Interventions: Ambulation/gait training;Discharge planning;DME/adaptive equipment instruction;Functional mobility training;Pain management;Splinting/orthotics;Psychosocial support;Therapeutic Activities;UE/LE Strength taining/ROM;UE/LE Coordination activities;Therapeutic Exercise;Patient/family education;Skin care/wound management;Stair training;Neuromuscular re-education;Functional electrical stimulation;Disease management/prevention;Community reintegration;Balance/vestibular training PT Transfers Anticipated Outcome(s): mod/i PT Locomotion Anticipated Outcome(s): mod/i household gait w/ LRAD PT Recommendation Follow Up Recommendations: Home health PT Patient destination: Home Equipment Recommended: To be determined Equipment Details: has RW and SPC already  Skilled Therapeutic Intervention  Pt in supine and agreeable to therapy, no c/o pain. Performed functional mobility as detailed below including bed mobility, transfers, and gait.  Pt on 4 L/min O2 at rest, spO2 @ 88%. Turned up to 6 L/min during mobility. Needed frequent and prolonged seated rest breaks in between all mobility 2/2 increase in work of breathing and desat to 81-82%. SpO2 returns to >88% within a minute of resting. Instructed pt in results of PT evaluation as detailed below, PT POC, rehab potential, rehab goals, and discharge recommendations. Additionally discussed CIR's policies regarding fall safety and use of chair alarm and/or quick release belt. Pt verbalized understanding and in agreement. Ended session in w/c, all needs in reach.  PT Evaluation Precautions/Restrictions Precautions Precautions: None Restrictions Weight Bearing Restrictions: No General   Vital SignsTherapy Vitals Temp: 98.5 F (36.9 C) Temp Source: Oral Pulse Rate: 94 Resp: 20 BP: 114/63 Patient Position (if appropriate): Lying Oxygen Therapy SpO2: 96 % O2 Device: Nasal Cannula O2 Flow Rate (L/min): 4 L/min Pain Pain Assessment Pain Scale: 0-10 Pain Score: 0-No pain Home Living/Prior Functioning Home Living Available Help at Discharge: Available 24 hours/day Type of Home: House Home Access: Stairs to enter [employer] of Steps: 4-5 steps to enter home via garage Home Layout: Two level;Able to live on main level with bedroom/bathroom  Lives With: Daughter Prior Function Level of Independence: Independent with basic ADLs;Independent with homemaking with ambulation;Independent with gait;Independent with transfers;Requires assistive device for independence  Able to Take Stairs?: Yes(had some difficulty, but able to perform w/o assist) Driving: No 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 Comments: 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), had been using SPC and RW over last few months 2/2 BLE weakness Vision/Perception  Perception Perception: Within Functional Limits Praxis Praxis: Intact   Cognition Orientation Level: Oriented X4 Sensation Sensation Light Touch: Impaired by gross assessment(reports diminished sensation w/ B feet, reports it has been going on for years, pt states it's because his  feet are cold) Motor  Motor Motor: Within Functional Limits Motor - Skilled Clinical Observations: generalized weakness  Mobility Bed Mobility Bed Mobility: Rolling Right;Rolling Left;Supine to Sit;Sit to Supine Rolling Right: Supervision/verbal cueing Rolling Left: Supervision/Verbal cueing Supine to Sit: Supervision/Verbal cueing Sit to Supine: Supervision/Verbal cueing Transfers Transfers: Sit to Stand;Stand Pivot Transfers;Stand to Sit Sit to Stand: Minimal Assistance - Patient > 75% Stand to Sit: Minimal Assistance - Patient > 75% Stand Pivot Transfers: Minimal Assistance - Patient > 75% Stand Pivot Transfer Details: Verbal cues for precautions/safety;Verbal cues for technique;Manual facilitation for weight shifting Transfer (Assistive device): None Locomotion  Gait Ambulation: Yes Gait Assistance: Contact Guard/Touching assist Gait Distance (Feet): 25 Feet Assistive device: Rolling walker Stairs / Additional Locomotion Stairs: No Wheelchair Mobility Wheelchair Mobility: No  Trunk/Postural Assessment  Cervical Assessment Cervical Assessment: Exceptions to WFL(forward head) Thoracic Assessment Thoracic Assessment: Within Functional Limits Lumbar Assessment Lumbar Assessment: Exceptions to WFL(posterior pelvic tilt) Postural Control Postural Control: Deficits on evaluation(delayed/insufficient)  Balance Balance Balance Assessed: Yes Static Sitting Balance Static Sitting - Level of Assistance: 5: Stand by assistance Dynamic Sitting Balance Dynamic Sitting - Level of Assistance: 5: Stand by assistance Static Standing Balance Static Standing - Level of Assistance: 5: Stand by assistance Dynamic Standing Balance Dynamic Standing - Level of Assistance: 4: Min  assist Extremity Assessment  RLE Assessment Passive Range of Motion (PROM) Comments: WFL General Strength Comments: Globally 3+ to 4-/5 LLE Assessment Passive Range of Motion (PROM) Comments: WFL General Strength Comments: Globally 3+ to 4-/5    Refer to Care Plan for Long Term Goals  Recommendations for other services: None   Discharge Criteria: Patient will be discharged from PT if patient refuses treatment 3 consecutive times without medical reason, if treatment goals not met, if there is a change in medical status, if patient makes no progress towards goals or if patient is discharged from hospital.  The above assessment, treatment plan, treatment alternatives and goals were discussed and mutually agreed upon: by patient  Algie Cales K Maziah Keeling 12/02/2018, 8:38 AM

## 2018-12-03 ENCOUNTER — Inpatient Hospital Stay (HOSPITAL_COMMUNITY): Payer: Medicare HMO

## 2018-12-03 ENCOUNTER — Encounter (HOSPITAL_COMMUNITY): Payer: Medicare HMO | Admitting: Psychology

## 2018-12-03 ENCOUNTER — Inpatient Hospital Stay (HOSPITAL_COMMUNITY): Payer: Medicare HMO | Admitting: Occupational Therapy

## 2018-12-03 DIAGNOSIS — R350 Frequency of micturition: Secondary | ICD-10-CM

## 2018-12-03 DIAGNOSIS — Z794 Long term (current) use of insulin: Secondary | ICD-10-CM

## 2018-12-03 DIAGNOSIS — R000 Tachycardia, unspecified: Secondary | ICD-10-CM

## 2018-12-03 LAB — GLUCOSE, CAPILLARY
Glucose-Capillary: 177 mg/dL — ABNORMAL HIGH (ref 70–99)
Glucose-Capillary: 162 mg/dL — ABNORMAL HIGH (ref 70–99)
Glucose-Capillary: 184 mg/dL — ABNORMAL HIGH (ref 70–99)
Glucose-Capillary: 202 mg/dL — ABNORMAL HIGH (ref 70–99)

## 2018-12-03 NOTE — Progress Notes (Signed)
Occupational Therapy Session Note  Patient Details  Name: Andrew Larsen MRN: [id number] Date of Birth: 02/05/1941  Today's Date: 12/03/2018 OT Individual Time: 1275-1700 OT Individual Time Calculation (min): 41 min    Short Term Goals: Week 1:  OT Short Term Goal 1 (Week 1): Pt will complete 1 grooming task in standing to demonstrate increased standing tolerance OT Short Term Goal 2 (Week 1): Pt will complete toilet transfers with LRAD with supervision OT Short Term Goal 3 (Week 1): Pt will complete sit > stand with supervision to complete LB dressing/hygiene  Skilled Therapeutic Interventions/Progress Updates:    Upon entering the room, pt seated on EOB and starting breakfast. OT educating pt on energy conservation techniques and providing pt with paper handouts to read on his own as well. Pt seated on EOB for 30 minutes and reports increased fatigue with HR being 113 bpm and 89% O2 saturation while on 6 L via . Pt returning to supine secondary to fatigue with min A. Call bell and all needed items within reach upon exiting the room.   Therapy Documentation Precautions:  Precautions Precautions: Fall Precaution Comments: watch O2 sats, attempt to wean Restrictions Weight Bearing Restrictions: No General:   Vital Signs: Therapy Vitals Temp: 98 F (36.7 C) Temp Source: Oral Pulse Rate: (!) 104 Resp: 18 BP: 127/71 Patient Position (if appropriate): Lying Oxygen Therapy SpO2: 97 % O2 Device: Nasal Cannula O2 Flow Rate (L/min): 5 L/min Pain:   ADL: ADL Grooming: Contact guard Where Assessed-Grooming: Sitting at sink Upper Body Bathing: Supervision/safety, Setup Where Assessed-Upper Body Bathing: Sitting at sink Lower Body Bathing: Moderate assistance Where Assessed-Lower Body Bathing: Sitting at sink, Standing at sink Upper Body Dressing: Minimal assistance Where Assessed-Upper Body Dressing: Sitting at sink Lower Body Dressing: Moderate assistance Where  Assessed-Lower Body Dressing: Sitting at sink, Standing at sink Toileting: Minimal assistance Toilet Transfer: Minimal assistance Toilet Transfer Method: Stand pivot   Therapy/Group: Individual Therapy  Gypsy Decant 12/03/2018, 9:00 AM

## 2018-12-03 NOTE — Progress Notes (Signed)
Social Work Patient ID: Andrew Larsen, male   DOB: 09-05-1940, 78 y.o.   MRN: [id number]  Met with pt and spoke with daughter to discuss team conference goals mod/i level and target discharge date 8/18. Pt feels he is doing well but discouraged with all of the rest breaks he needs to take. Hopefully this will get better and his O2 needs will not be so high. Daughter feels he is doing well and can see his progress. A portable O2 to hospital and concentrator was delivered to home 7/10, will keep due to will need at discharge. Continue to work on discharge needs.

## 2018-12-03 NOTE — Patient Care Conference (Signed)
Inpatient RehabilitationTeam Conference and Plan of Care Update Date: 12/03/2018   Time: 10:15 AM    Patient Name: Andrew Larsen      Medical Record Number: [id number]  Date of Birth: 1941/04/22 Sex: Male         Room/Bed: 4M08C/4M08C-01 Payor Info: Payor: [employer] MEDICARE / Plan: [place] HMO / Product Type: *No Product type* /    Admitting Diagnosis: 5. Gen Team  Debility, ARDS  Admit Date/Time:  12/01/2018 11:38 AM Admission Comments: No comment available   Primary Diagnosis:  Physical debility Principal Problem: Physical debility  Patient Active Problem List   Diagnosis Date Noted  . Tachycardia   . Urinary frequency   . Leukopenia   . Hypoalbuminemia due to protein-calorie malnutrition ([place])   . Supplemental oxygen dependent   . Hyponatremia   . Thrombocytopenia ([place])   . Controlled type 2 diabetes mellitus with hyperglycemia ([place])   . CKD (chronic kidney disease), stage III ([place])   . Benign essential HTN   . Physical debility 12/01/2018  . Pressure injury of skin 11/27/2018  . Acute respiratory failure with hypoxia ([place])   . 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 Acute respiratory disease due to COVID-19 virus 11/05/2018  . COVID-19 virus infection 11/03/2018  . CKD (chronic kidney disease) stage 3, GFR 30-59 ml/min (HCC) 11/03/2018  . Type 2 diabetes mellitus ([place]) 11/03/2018  . Essential hypertension 11/03/2018  . Claudication in peripheral vascular disease ([place]) 06/10/2016  . Morbid obesity ([place]) 09/21/2015  . Upper airway cough syndrome 09/20/2015  . Cough variant asthma 09/20/2015    Expected Discharge Date: Expected Discharge Date: 12/16/18  Team Members Present: Physician leading conference: Dr. Delice Lesch Social Worker Present: Ovidio Kin, LCSW Nurse Present: Rayetta Humphrey, RN PT Present: Georjean Mode, PT;Cherie Grunenberg, PT OT Present: Roanna Epley, [place], OT SLP Present: Charolett Bumpers, SLP PPS Coordinator present : Gunnar Fusi,  SLP     Current Status/Progress Goal Weekly Team Focus  Medical   Functional and mobility deficits secondary to debility after ARDS, COVID  Improve mobility, endurance, SOB, CBGs  See above   Bowel/Bladder             Swallow/Nutrition/ Hydration             ADL's   min assist sit > stand, min assist transfers, Mod assist bathing and LB dressing; very deconditioned.  Required 6L O2 during functional tasks  Mod I toilet transfers and toileting, Supervision/setup bathing and dressing  activity tolerance/endurance, ADL retraining   Mobility   Supervision bed mobility, min assist transfers, gait 25' w/ RW SPO2 dropped to 70's on 6L, very limited by endurance, needs 6 L/min O2 during mobility  mod/i, household gait  endurance, functional balance, BLE strengthening, patient/caregiver education   Communication             Safety/Cognition/ Behavioral Observations            Pain             Skin                *See Care Plan and progress notes for long and short-term goals.     Barriers to Discharge  Current Status/Progress Possible Resolutions Date Resolved   Physician    Medical stability;Weight;New oxygen     See above  Therapies, wean supplemental oxygen as tolerated, optimize DM meds      Nursing  PT  Inaccessible home environment;Home environment access/layout;New oxygen  4-5 STE, 2 story home, can live on main level; on 6L O2 during mobility and O2 sats drop to the 70's with increased activity              OT                  SLP                SW                Discharge Planning/Teaching Needs:  HOme with daughter and son in-law, daughter is currenlty working from home due to [employer]. Neuro-psych to see today      Team Discussion:  Goals mod/i level, currenlty min-mod assist with many rest breaks. Requiring 6 L O2. Medically adjusting BS and BP meds. Working on activity tolerance and endurance issues. Neuro-psych seeing today./  Revisions to  Treatment Plan:  DC 8/18    Continued Need for Acute Rehabilitation Level of Care: The patient requires daily medical management by a physician with specialized training in physical medicine and rehabilitation for the following conditions: Daily direction of a multidisciplinary physical rehabilitation program to ensure safe treatment while eliciting the highest outcome that is of practical value to the patient.: Yes Daily medical management of patient stability for increased activity during participation in an intensive rehabilitation regime.: Yes Daily analysis of laboratory values and/or radiology reports with any subsequent need for medication adjustment of medical intervention for : Pulmonary problems;Diabetes problems   I attest that I was present, lead the team conference, and concur with the assessment and plan of the team. Teleconference held due to [place] 19   Semiah Konczal, [place] Rhyme 12/03/2018, 2:26 PM

## 2018-12-03 NOTE — Progress Notes (Signed)
Occupational Therapy Session Note  Patient Details  Name: Andrew Larsen MRN: [id number] Date of Birth: [birth year]  Today's Date: 12/03/2018 OT Individual Time: 1330-1425 OT Individual Time Calculation (min): 55 min    Short Term Goals: Week 1:  OT Short Term Goal 1 (Week 1): Pt will complete 1 grooming task in standing to demonstrate increased standing tolerance OT Short Term Goal 2 (Week 1): Pt will complete toilet transfers with LRAD with supervision OT Short Term Goal 3 (Week 1): Pt will complete sit > stand with supervision to complete LB dressing/hygiene  Skilled Therapeutic Interventions/Progress Updates:    Pt received sitting up in recliner coughing and asking for pain medication. RN alerted to request. Pt took medicine seated and then use RW to complete functional mobility to sink. Pt on 6L [place] on portable O2. Pt dropped to 89% after ~30 sec min of mobility. Pt required cueing for breathing technique to rebound saturation to 90%. Pt required very frequent rest breaks with all activity including UB bathing/dressing. No physical assist needed but SpO2 monitored throughout session. O2 frequently dropped to ~87%, pt SOB. Pt used urinal seated, voiding 300 cc. Pt completed LB bathing seated by scooting forward on w/c. Pt with increased work of breathing throughout session and use of accessory muscles. Pt independently recalled some energy conservation strategies during bathing. Pt able to thread BLE through pants with (S) while seated. Pt able to pull up in standing with no physical assist, (S) for balance, pt using sink for UE support. Pt rolled w/c to bedside and completed stand pivot transfer with close (S). SpO2 87% upon sitting. Pt left supine with all needs met, bed alarm set.   Therapy Documentation Precautions:  Precautions Precautions: Fall Precaution Comments: watch O2 sats, attempt to wean Restrictions Weight Bearing Restrictions: No  Therapy/Group: Individual  Therapy  Curtis Sites 12/03/2018, 1:29 PM

## 2018-12-03 NOTE — Progress Notes (Signed)
[place] PHYSICAL MEDICINE & REHABILITATION PROGRESS NOTE  Subjective/Complaints: Patient seen sitting up at the edge of his bed working with therapy this morning.  He states he slept well overnight.  Discussed O2 sats with therapies.  Patient notes right lower extremity weakness is baseline.  He notes he had a good first day of therapies yesterday.  ROS: + Shortness of breath.  Denies CP, N/V/D.  Objective: Vital Signs: Blood pressure 127/71, pulse (!) 104, temperature 98 F (36.7 C), temperature source Oral, resp. rate 18, height 5\' 11"  (1.803 m), weight 111.6 kg, SpO2 97 %. No results found. Recent Labs    12/02/18 0909  WBC 3.8*  HGB 13.5  HCT 40.4  PLT 132*   Recent Labs    12/02/18 0909  NA 134*  K 4.4  CL 95*  CO2 29  GLUCOSE 275*  BUN 27*  CREATININE 1.61*  CALCIUM 9.0    Physical Exam: BP 127/71 (BP Location: Left Arm)   Pulse (!) 104   Temp 98 F (36.7 C) (Oral)   Resp 18   Ht 5\' 11"  (1.803 m)   Wt 111.6 kg   SpO2 97%   BMI 34.31 kg/m  Constitutional: No distress . Vital signs reviewed. HENT: Normocephalic.  Atraumatic. Eyes: EOMI. No discharge. Cardiovascular: No JVD. Respiratory: Normal effort.  No stridor.  + [place]. GI: Non-distended. Skin: Warm and dry.  Intact. Psych: Normal mood.  Normal behavior. Musc: B/l LE edema Neurological: Alert Motor: B/l UE 4+/5 proximal to distal LLE: 4/5  RLE: 4-/5 HF, KE and 4/5 ADF/PF.  Skin: Skin iswarmand dry.  Psychiatric: He has anormal mood and affect. Hisbehavior is normal.  Assessment/Plan: 1. Functional deficits secondary to debility which require 3+ hours per day of interdisciplinary therapy in a comprehensive inpatient rehab setting.  Physiatrist is providing close team supervision and 24 hour management of active medical problems listed below.  Physiatrist and rehab team continue to assess barriers to discharge/monitor patient progress toward functional and medical goals  Care  Tool:  Bathing    Body parts bathed by patient: Right arm, Left arm, Chest, Abdomen, Front perineal area, Right upper leg, Left upper leg, Face   Body parts bathed by helper: Buttocks, Right lower leg, Left lower leg     Bathing assist Assist Level: Moderate Assistance - Patient 50 - 74%     Upper Body Dressing/Undressing Upper body dressing   What is the patient wearing?: Pull over shirt    Upper body assist Assist Level: Minimal Assistance - Patient > 75%    Lower Body Dressing/Undressing Lower body dressing      What is the patient wearing?: Pants     Lower body assist Assist for lower body dressing: Moderate Assistance - Patient 50 - 74%     Toileting Toileting    Toileting assist Assist for toileting: Independent with assistive device Assistive Device Comment: Urinal   Transfers Chair/bed transfer  Transfers assist     Chair/bed transfer assist level: Minimal Assistance - Patient > 75%     Locomotion Ambulation   Ambulation assist      Assist level: Contact Guard/Touching assist Assistive device: Walker-rolling Max distance: 25'   Walk 10 feet activity   Assist     Assist level: Contact Guard/Touching assist Assistive device: Walker-rolling   Walk 50 feet activity   Assist Walk 50 feet with 2 turns activity did not occur: Safety/medical concerns         Walk 150 feet activity  Assist Walk 150 feet activity did not occur: Safety/medical concerns         Walk 10 feet on uneven surface  activity   Assist Walk 10 feet on uneven surfaces activity did not occur: Safety/medical concerns         Wheelchair     Assist Will patient use wheelchair at discharge?: No   Wheelchair activity did not occur: N/A         Wheelchair 50 feet with 2 turns activity    Assist    Wheelchair 50 feet with 2 turns activity did not occur: N/A       Wheelchair 150 feet activity     Assist Wheelchair 150 feet activity did  not occur: N/A          Medical Problem List and Plan: 1.Functional and mobility deficitssecondary to debility after ARDS, COVID  Continue CIR  Team conference today to discuss current and goals and coordination of care, home and environmental barriers, and discharge planning with nursing, case manager, and therapies.  2. Antithrombotics: -DVT/anticoagulation:Pharmaceutical:Lovenox -antiplatelet therapy: ASA 81 3. Pain Management:tylenol prn  Gabapentin 600 3 times daily 4. Mood:LCSW to follow for evaluation and support. -antipsychotic agents: N/a 5. Neuropsych: This patientiscapable of making decisions onhisown behalf. 6. Skin/Wound Care:Air mattress overlay. Local care to sacral decub. 7. Fluids/Electrolytes/Nutrition:Monitor I/Os.  8. HTN: Monitor BP   Continue Lasix  Continue metoprolol 25  Controlled on 8/5  Monitor with increased mobility 9. CKD III:   Cr. 1.61 on 8/4  Cont to monitor 9. T2DM with hyperglycemia: Hgb A1C- 7.3.   SSI  NovoLog 70/30 18 units at bedtime  NovoLog 70/30 34 units daily  Elevated on 8/5  Monitor with increased mobility  10. CAD: Continue Lasix and lopressor.  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  12. OSA: Question ability to transition to CPAP 13. Thrombocytopenia:   Plts 132 on 8/4  Cont to monitor 14. Hyponatremia:   Sodium 134 on 8/4  Cont to monitor 15. Cough/Hypoxia: Encourage IS.   Wean supplemental oxygen as tolerated, continues to require Donebs QID with cough medications prn. 16. Hypoalbuminemia  Supplement initiated on 8/4 17. Leukopenia  WBCs 3.8 on 8/4  Cont to monitor 18.  Tachycardia-secondary deconditioning  Continue metoprolol 25 19.  Urinary frequency  Continue Ditropan 5    LOS: 2 days A FACE TO FACE EVALUATION WAS PERFORMED  Andrew Larsen Phenix 12/03/2018, 9:27  AM

## 2018-12-03 NOTE — Consult Note (Signed)
Neuropsychological Consultation   Patient:   Andrew Larsen   DOB:   [birth year]  MR Number:  [id number]  Location:  [facility] [address] [facility] [address] [place] [id number] [place] [address] Dept: [place]: [phone]           Date of Service:   12/03/2018  Start Time:   3 PM End Time:   4 PM  Provider/Observer:  Ilean Skill, Psy.D.       Clinical Neuropsychologist       Billing Code/Service: 26948  Chief Complaint:    Mindi Slicker. Rhew is a 78 year old male with history of CKD, PAD, HTN, T2DM, OSA.  Patient admitted on 11/03/18 with nonproductive cough and SOB.  He had been exposed to multiple family members with COVID 19 and found to be positive.  Patient was admitted for observation and CXR revealed bilateral interstitial prominence due to pneumonitis v/s CHF.  Pt continued to decline and was treated with Remdesivir, Actemra, steroids and convalescent plasma.  Patient has improved and weaned down to 4-6 L oxygen.  Continues with weakness as well as debility.  Patient recommended for CIR due to functional decline.     Reason for Service:  The patient was referred for neuropsychological consultation due to coping and adjustment issues.  Below is the HPI for the current admission.  NIO:EVOJJK L Tantillo is a 78 year old male with history of CKD, PAD, HTN, T2DM, OSA , [facility]; who was admitted on 11/03/18 with nonproductive cough and SOB. He had been exposed to multiple family members with COVID and found out that his outpatient test was positive. He was admitted for observation and CXR revealed bilateral interstitial prominence due to pneumonitis v/s CHF. He continued to decline and was treated with Remdesivir, Actemra, steroids and convalescent plasma. He was monitored in ICU due to high oxygen up to 60% HFNC/ 35 L --PCCM recommended goal at rest to be  SAO2 >85%. Fluid overload treated with intermittent IV diuresis. He has improved with supportive care and has been weaned down to 4- 6 L oxygen per [place]. Therapy ongoing and patient noted to be limited by bouts of coughing, DOE/hypoxia, BLE weakness as well as debility. CIR recommended due to functional decline.  Current Status: Patient reports that he continues to be very week and activities are limited due to drop in O2.  Patient denies signficant depressive or anxiety symptoms and coping appears good given what has happened to him medically.     Behavioral Observation: RAMBO SARAFIAN  presents as a 78 y.o.-year-old Right African American Male who appeared his stated age. his dress was Appropriate and he was Well Groomed and his manners were Appropriate to the situation.  his participation was indicative of Appropriate and Attentive behaviors.  There were any physical disabilities noted.  he displayed an appropriate level of cooperation and motivation.     Interactions:    Active Appropriate  Attention:   abnormal and attention span appeared shorter than expected for age  Memory:   within normal limits; recent and remote memory intact  Visuo-spatial:  not examined  Speech (Volume):  low  Speech:   normal; normal  Thought Process:  Coherent and Relevant  Though Content:  WNL; not suicidal and not homicidal  Orientation:   person, place, time/date and situation  Judgment:   Good  Planning:   Good  Affect:  Appropriate  Mood:    Dysphoric  Insight:   Good  Intelligence:   high  Medical History:   Past Medical History:  Diagnosis Date  . CKD (chronic kidney disease)    CKD stage III (01/2018)  . Daytime somnolence   . Diabetes mellitus without complication ([place])   . Fracture    right fibula/wears boot cast  . GERD (gastroesophageal reflux disease)   . Glaucoma   . Hypercholesteremia   . Hyperlipidemia   . Hypertension   . Mold exposure   . PAD (peripheral artery  disease) ([place])   . Sleep apnea    Psychiatric History:  No prior psychiatric history  Family Med/Psych History:  Family History  Problem Relation Age of Onset  . Heart disease Father   . Prostate cancer Brother   . Cancer Brother   . Diabetes Brother   . Colon cancer Neg Hx       Impression/DX:  Merced L. Grewell is a 78 year old male with history of CKD, PAD, HTN, T2DM, OSA.  Patient admitted on 11/03/18 with nonproductive cough and SOB.  He had been exposed to multiple family members with COVID 19 and found to be positive.  Patient was admitted for observation and CXR revealed bilateral interstitial prominence due to pneumonitis v/s CHF.  Pt continued to decline and was treated with Remdesivir, Actemra, steroids and convalescent plasma.  Patient has improved and weaned down to 4-6 L oxygen.  Continues with weakness as well as debility.  Patient recommended for CIR due to functional decline.    Patient reports that he continues to be very week and activities are limited due to drop in O2.  Patient denies signficant depressive or anxiety symptoms and coping appears good given what has happened to him medically.   Disposition/Plan:  Will follow-up with the patient next week.  Patient still trying to cope with not just his stage of recovery from COVID 19 but also his medical status as a whole.    Diagnosis:   Debility        Electronically Signed   _______________________ Ilean Skill, Psy.D.

## 2018-12-03 NOTE — Progress Notes (Signed)
Physical Therapy Session Note  Patient Details  Name: Andrew Larsen MRN: [id number] Date of Birth: 1940/08/09  Today's Date: 12/03/2018 PT Individual Time: 0900-1000 PT Individual Time Calculation (min): 60 min   Short Term Goals: Week 1:  PT Short Term Goal 1 (Week 1): Pt will ambulate 55' w/ LRAD w/ supevision PT Short Term Goal 2 (Week 1): Pt will initiate stair training PT Short Term Goal 3 (Week 1): Pt will participate in 30 min of upright mobility w/o increase in fatigue PT Short Term Goal 4 (Week 1): Pt will maintain dynamic standing balance w/ supervision  Skilled Therapeutic Interventions/Progress Updates:     Patient in bed upon PT arrival. Patient alert and agreeable to PT session, on 5L O2 via nasal canula at beginning of session. Vitals in supine: HR 107, O2 92% on 5L, BP 114/69.  Therapeutic Activity: Bed Mobility: Patient performed supine to/from sit with supervision with increased time to complete task in a flat bed without use of bed rails. O2 sats dropped to 81%, HR 114, BP 129/81 sitting EOB. Required 1 min of pursed lip breathing with cues to recover to to 92% on 6L. Provided verbal cues for pushing through elbows to sit up and use of diaphragm with pursed lip breathing after mobility for improved breath support. Transfers: Patient performed stand pivot transfer with min A using RW form an elevated bed to the recliner. Provided verbal cues for leaning forward to stand and pushing up from the bed. Vitals in standing: HR 121, SPO2 83% on 6L, BP 133/88. Required a sitting rest break and 2 min to recover to >85% SPO2 on 6L and 4 min of pursed lip breathing to recover to >90% on 6L.   Therapeutic Exercise: Patient performed the following exercises with verbal and tactile cues for proper technique. SPO2 90-93% on 6L throughout exercises.  -Sitting EOB performed diaphragmatic breathing with multimodal cues for increased activation of diaphragm progressing to light manual  resistance from therapist to abdomen over diaphragm 2x10.  -B Seated marching 2x10 -B Seated LAQs R 2x5 and L 2x10  Patient in recliner seated on w/c cushion with a pillow behind his head and back and under B LEs for increased comfort to encourage sitting OOB at end of session with breaks locked, chair alarm set, and all needs within reach. ON 5L O2 at 95% at end of session. RN made aware of vitals.   Therapy Documentation Precautions:  Precautions Precautions: Fall Precaution Comments: watch O2 sats, attempt to wean Restrictions Weight Bearing Restrictions: No Pain: Pain Assessment Pain Scale: 0-10 Pain Score: 0-No pain    Therapy/Group: Individual Therapy  Can Lucci L Alaina Donati PT, DPT  12/03/2018, 12:44 PM

## 2018-12-03 NOTE — Progress Notes (Signed)
Occupational Therapy Session Note  Patient Details  Name: Andrew Larsen MRN: [id number] Date of Birth: 1941/04/07  Today's Date: 12/03/2018 OT Individual Time: 1100-1200 OT Individual Time Calculation (min): 60 min    Short Term Goals: Week 1:  OT Short Term Goal 1 (Week 1): Pt will complete 1 grooming task in standing to demonstrate increased standing tolerance OT Short Term Goal 2 (Week 1): Pt will complete toilet transfers with LRAD with supervision OT Short Term Goal 3 (Week 1): Pt will complete sit > stand with supervision to complete LB dressing/hygiene  Skilled Therapeutic Interventions/Progress Updates:    patient seated in recliner, pleasant and cooperative, 6L O2, fatigued with unsupported sitting and conversation.  Able to tolerate standing for 1 minute with RW CGA - HR 107, O2 sat 86% after stand.  Ambulation 15 feet recliner to/from toilet with CGA with RW.  SPT to toilet CGA - required assist of 2 to get up from toilet due to fatigue - provided elevated commode over toilet for future use.  Completed seated posture activities and UE mobility exercise - able to tolerate 2-3 reps with rest break in between - reviewed deep breathing techniques and energy conservation.  Patient remained sitting in recliner at close of session with seat alarm set and call bell in reach.    Therapy Documentation Precautions:  Precautions Precautions: Fall Precaution Comments: watch O2 sats, attempt to wean Restrictions Weight Bearing Restrictions: No General:   Vital Signs: Therapy Vitals Pulse Rate: (!) 104 Resp: 18 Patient Position (if appropriate): Lying Oxygen Therapy SpO2: 97 % O2 Device: Nasal Cannula O2 Flow Rate (L/min): 5 L/min Pain: Pain Assessment Pain Scale: 0-10 Pain Score: 0-No pain   Other Treatments:     Therapy/Group: Individual Therapy  Carlos Levering 12/03/2018, 12:28 PM

## 2018-12-04 ENCOUNTER — Inpatient Hospital Stay (HOSPITAL_COMMUNITY): Payer: Medicare HMO | Admitting: Physical Therapy

## 2018-12-04 ENCOUNTER — Inpatient Hospital Stay (HOSPITAL_COMMUNITY): Payer: Medicare HMO | Admitting: Occupational Therapy

## 2018-12-04 ENCOUNTER — Inpatient Hospital Stay (HOSPITAL_COMMUNITY): Payer: Medicare HMO

## 2018-12-04 LAB — GLUCOSE, CAPILLARY
Glucose-Capillary: 224 mg/dL — ABNORMAL HIGH (ref 70–99)
Glucose-Capillary: 119 mg/dL — ABNORMAL HIGH (ref 70–99)
Glucose-Capillary: 183 mg/dL — ABNORMAL HIGH (ref 70–99)
Glucose-Capillary: 154 mg/dL — ABNORMAL HIGH (ref 70–99)

## 2018-12-04 MED ORDER — IPRATROPIUM-ALBUTEROL 0.5-2.5 (3) MG/3ML IN SOLN
3.0000 mL | Freq: Two times a day (BID) | RESPIRATORY_TRACT | Status: DC
  Administered 2018-12-04: 21:00:00 3 mL via RESPIRATORY_TRACT
  Filled 2018-12-04: qty 3

## 2018-12-04 NOTE — Progress Notes (Signed)
[facility] PHYSICAL MEDICINE & REHABILITATION PROGRESS NOTE  Subjective/Complaints: Patient seen sitting up at the edge of his bed this morning.  Per nursing, patient with difficulty sleeping overnight and cough.  Per therapies patient with shortness of breath and decreased activity tolerance due to?  Oxygen tank being off.  Per report, patient noted to be frustrated and complaining about the after mentioned.  However, on exam, patient states that he is doing well.  He notes improvement in shortness of breath and denies complaints.  Discussed with PA.  ROS: + Shortness of breath, improving.  Denies CP, N/V/D.  Objective: Vital Signs: Blood pressure 116/69, pulse 95, temperature 98.3 F (36.8 C), temperature source Oral, resp. rate 18, height 5\' 11"  (1.803 m), weight 111.6 kg, SpO2 97 %. No results found. Recent Labs    12/02/18 0909  WBC 3.8*  HGB 13.5  HCT 40.4  PLT 132*   Recent Labs    12/02/18 0909  NA 134*  K 4.4  CL 95*  CO2 29  GLUCOSE 275*  BUN 27*  CREATININE 1.61*  CALCIUM 9.0    Physical Exam: BP 116/69 (BP Location: Left Arm)   Pulse 95   Temp 98.3 F (36.8 C) (Oral)   Resp 18   Ht 5\' 11"  (1.803 m)   Wt 111.6 kg   SpO2 97%   BMI 34.31 kg/m  Constitutional: No distress . Vital signs reviewed. HENT: Normocephalic.  Atraumatic. Eyes: EOMI. No discharge. Cardiovascular: No JVD. Respiratory: Normal effort.  No stridor.  + [place]. GI: Non-distended. Skin: Warm and dry.  Intact. Psych: Normal mood.  Normal behavior. Musc: Bilateral lower extremity edema Neurological: Alert Motor: B/l UE 4+/5 proximal to distal LLE: 4/5  RLE: 4-/5 HF, KE and 4/5 ADF/PF.  Skin: Skin iswarmand dry.  Psychiatric: He has anormal mood and affect. Hisbehavior is normal.  Assessment/Plan: 1. Functional deficits secondary to debility which require 3+ hours per day of interdisciplinary therapy in a comprehensive inpatient rehab setting.  Physiatrist is providing close team  supervision and 24 hour management of active medical problems listed below.  Physiatrist and rehab team continue to assess barriers to discharge/monitor patient progress toward functional and medical goals  Care Tool:  Bathing    Body parts bathed by patient: Right arm, Left arm, Chest, Abdomen, Front perineal area, Right upper leg, Left upper leg, Face   Body parts bathed by helper: Buttocks, Right lower leg, Left lower leg     Bathing assist Assist Level: Minimal Assistance - Patient > 75%     Upper Body Dressing/Undressing Upper body dressing   What is the patient wearing?: Pull over shirt    Upper body assist Assist Level: Set up assist    Lower Body Dressing/Undressing Lower body dressing      What is the patient wearing?: Pants     Lower body assist Assist for lower body dressing: Moderate Assistance - Patient 50 - 74%     Toileting Toileting    Toileting assist Assist for toileting: Set up assist Assistive Device Comment: Urinal   Transfers Chair/bed transfer  Transfers assist     Chair/bed transfer assist level: Minimal Assistance - Patient > 75%     Locomotion Ambulation   Ambulation assist      Assist level: Contact Guard/Touching assist Assistive device: Walker-rolling Max distance: 25'   Walk 10 feet activity   Assist     Assist level: Contact Guard/Touching assist Assistive device: Walker-rolling   Walk 50 feet activity  Assist Walk 50 feet with 2 turns activity did not occur: Safety/medical concerns         Walk 150 feet activity   Assist Walk 150 feet activity did not occur: Safety/medical concerns         Walk 10 feet on uneven surface  activity   Assist Walk 10 feet on uneven surfaces activity did not occur: Safety/medical concerns         Wheelchair     Assist Will patient use wheelchair at discharge?: No   Wheelchair activity did not occur: N/A         Wheelchair 50 feet with 2 turns  activity    Assist    Wheelchair 50 feet with 2 turns activity did not occur: N/A       Wheelchair 150 feet activity     Assist Wheelchair 150 feet activity did not occur: N/A          Medical Problem List and Plan: 1.Functional and mobility deficitssecondary to debility after ARDS, COVID  Continue CIR  Repeat chest x-ray ordered  Acapella ordered 2. Antithrombotics: -DVT/anticoagulation:Pharmaceutical:Lovenox -antiplatelet therapy: ASA 81 3. Pain Management:tylenol prn  Gabapentin 600 3 times daily  Appears to be stable on 8/6 4. Mood:LCSW to follow for evaluation and support. -antipsychotic agents: N/a 5. Neuropsych: This patientiscapable of making decisions onhisown behalf. 6. Skin/Wound Care:Air mattress overlay. Local care to sacral decub. 7. Fluids/Electrolytes/Nutrition:Monitor I/Os.  8. HTN: Monitor BP   Continue Lasix  Continue metoprolol 25  Controlled on 3/6  Monitor with increased mobility 9. CKD III:   Cr.  1.61 on 8/4, labs ordered for tomorrow  Cont to monitor 9. T2DM with hyperglycemia: Hgb A1C- 7.3.   SSI  NovoLog 70/30 18 units at bedtime  NovoLog 70/30 34 units daily  Labile on 8/6, monitor for trend.  Monitor with increased mobility  10. CAD: Continue Lasix and lopressor.  Filed Weights   12/01/18 1200 12/02/18 0620  Weight: 114.5 kg 111.6 kg   ?  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  12. OSA: Question ability to transition to CPAP 13. Thrombocytopenia:   Plts 132 on 8/4  Cont to monitor 14. Hyponatremia:   Sodium 134 on 8/4, labs ordered for tomorrow  Cont to monitor 15. Cough/Hypoxia: Encourage IS.   Wean supplemental oxygen as tolerated, continues to require on 8/6 Donebs QID with cough medications prn. 16. Hypoalbuminemia  Supplement initiated on 8/4 17. Leukopenia  WBCs 3.8 on 8/4  Cont to monitor 18.  Tachycardia-secondary  deconditioning  Continue metoprolol 25  ?  Improving on 8/6 19.  Urinary frequency  Continue Ditropan 5    LOS: 3 days A FACE TO FACE EVALUATION WAS PERFORMED   Lorie Phenix 12/04/2018, 1:16 PM

## 2018-12-04 NOTE — Progress Notes (Signed)
Physical Therapy Session Note  Patient Details  Name: Andrew Larsen MRN: [id number] Date of Birth: 09-Oct-1940  Today's Date: 12/04/2018 PT Individual Time: 1107-1205 PT Individual Time Calculation (min): 58 min   Short Term Goals: Week 1:  PT Short Term Goal 1 (Week 1): Pt will ambulate 59' w/ LRAD w/ supevision PT Short Term Goal 2 (Week 1): Pt will initiate stair training PT Short Term Goal 3 (Week 1): Pt will participate in 30 min of upright mobility w/o increase in fatigue PT Short Term Goal 4 (Week 1): Pt will maintain dynamic standing balance w/ supervision  Skilled Therapeutic Interventions/Progress Updates:    Pt received sitting in w/c and agreeable to therapy session. Received on 4.5L of O2 via nasal cannula - switched to portable tank on 4L. Donned B shoes max assist for time management. Performed seated B LE long arc quads and marches 2x10 reps of each while monitoring HR WNL in mid to high 90s and SpO2 >93% throughout. Performed sit<>stands w/c<>RW with CGA for coming to standing and min assist for eccentric control on descent. Performed 2 bouts of ~15-20second standing marches with B UE support on RW and CGA for steadying - 1st trial: SpO2 on 4L decreased to 86% with increase to 6L and SpO2 recovered to 93% within 30 seconds; 2nd trial SpO2 on 4L decreased to 87% but pt able to recover to 93% on 4L within 45 seconds.  Transported to/from gym in w/c. Stand pivot w/c<>NuStep using RW with min assist for balance. Performed B UE and B LE reciprocal movements on Nustep against level 3 resistance focusing on cardiopulmonary endurance training while on 4L of O2 and monitoring HR 90-100bpm and SpO2 >95% throughout for a total of 5 minutes and 271steps - provided rest breaks every minute to reassess vitals. Transferred back to w/c as described above and transported back to room. Pt left sitting in w/c with needs in reach, lines intact, and seat belt alarm on.  Therapy  Documentation Precautions:  Precautions Precautions: Fall Precaution Comments: watch O2 sats, attempt to wean Restrictions Weight Bearing Restrictions: No  Pain: Denies pain during session.   Therapy/Group: Individual Therapy  Tawana Scale, PT, DPT 12/04/2018, 7:59 AM

## 2018-12-04 NOTE — Progress Notes (Signed)
Occupational Therapy Session Note  Patient Details  Name: Andrew Larsen MRN: [id number] Date of Birth: 09-May-1940  Today's Date: 12/04/2018 OT Individual Time: [phone] OT Individual Time Calculation (min): 55 min    Short Term Goals: Week 1:  OT Short Term Goal 1 (Week 1): Pt will complete 1 grooming task in standing to demonstrate increased standing tolerance OT Short Term Goal 2 (Week 1): Pt will complete toilet transfers with LRAD with supervision OT Short Term Goal 3 (Week 1): Pt will complete sit > stand with supervision to complete LB dressing/hygiene  Skilled Therapeutic Interventions/Progress Updates:    patient in bed, c/o not sleeping last night, coughing and fatigued.  Supine to SSP with min A.  Labored breathing in unsupported sitting 6L O2 via [place]. SPT to w/c with min A.  O2 sat 85, HR 103.  Nursing aware.  Patient encouraged to do pursed lip deep breaths and upright posture.  Completed UB bathing and dressing w/c level CS and increased time. Oral care with set up, LB bathing and dressing mod A.  Sit to stand at sink CGA.  Coughing at times t/o session - seems to subside with sips of water.  Patient remained in w/c at close of session, seat belt alarm set, call bell in reach, O2 sat 93, HR 91.    Therapy Documentation Precautions:  Precautions Precautions: Fall Precaution Comments: watch O2 sats, attempt to wean Restrictions Weight Bearing Restrictions: No General:   Vital Signs: Therapy Vitals Pulse Rate: 95 Resp: 18 Patient Position (if appropriate): Lying Oxygen Therapy SpO2: 97 % O2 Device: Nasal Cannula O2 Flow Rate (L/min): 4 L/min FiO2 (%): 36 % Pain: Pain Assessment Pain Scale: 0-10 Pain Score: 0-No pain   Other Treatments:     Therapy/Group: Individual Therapy  Carlos Levering 12/04/2018, 11:41 AM

## 2018-12-04 NOTE — Progress Notes (Signed)
Occupational Therapy Session Note  Patient Details  Name: Andrew Larsen MRN: [id number] Date of Birth: [birth year]  Today's Date: 12/04/2018 OT Individual Time: 1305-1330 OT Individual Time Calculation (min): 25 min    Short Term Goals: Week 1:  OT Short Term Goal 1 (Week 1): Pt will complete 1 grooming task in standing to demonstrate increased standing tolerance OT Short Term Goal 2 (Week 1): Pt will complete toilet transfers with LRAD with supervision OT Short Term Goal 3 (Week 1): Pt will complete sit > stand with supervision to complete LB dressing/hygiene  Skilled Therapeutic Interventions/Progress Updates:    patient seated in w/c, states that he is feeling better than this morning.  4L O2 via [place] on room system.  Reviewed set up and gauge on portable oxygen.  Sit to stand from w/c CGA - able to stand for 2 minutes, upon return to sitting position HR at 109, O2 sat 91%.  Reviewed posture and diaphragmatic breathing - coughing increased with deep breaths, relieved with water intake.  Unsupported sitting tolerated for 4 minutes at a time, completed light UB ROM with poor tolerance.  He remained in w/c at close of session with seat alarm set and call bell in reach.    Therapy Documentation Precautions:  Precautions Precautions: Fall Precaution Comments: watch O2 sats, attempt to wean Restrictions Weight Bearing Restrictions: No General: General OT Amount of Missed Time: 5 Minutes PT Missed Treatment Reason: Patient fatigue Vital Signs: Therapy Vitals Temp: 98.5 F (36.9 C) Temp Source: Oral Pulse Rate: 86 BP: 108/68 Patient Position (if appropriate): Lying Oxygen Therapy SpO2: 100 % O2 Device: Nasal Cannula O2 Flow Rate (L/min): 5 L/min Pain: Pain Assessment Pain Scale: 0-10 Pain Score: 0-No pain   Other Treatments:     Therapy/Group: Individual Therapy  Carlos Levering 12/04/2018, 3:29 PM

## 2018-12-04 NOTE — Progress Notes (Signed)
Occupational Therapy Session Note  Patient Details  Name: Andrew Larsen MRN: [id number] Date of Birth: 1941/03/12  Today's Date: 12/04/2018 OT Individual Time: 8527-7824 OT Individual Time Calculation (min): 39 min    Short Term Goals: Week 1:  OT Short Term Goal 1 (Week 1): Pt will complete 1 grooming task in standing to demonstrate increased standing tolerance OT Short Term Goal 2 (Week 1): Pt will complete toilet transfers with LRAD with supervision OT Short Term Goal 3 (Week 1): Pt will complete sit > stand with supervision to complete LB dressing/hygiene  Skilled Therapeutic Interventions/Progress Updates:    Upon entering the room, pt seated on EOB with breakfast tray. Pt with several questions regarding oxygen use and " what can happen if your body goes without oxygen?" OT answered all questions and notified RN of pt's concerns. Pt verbalizing not feeling well, coughing, and not sleeping well since Tuesday night. OT discussed schedule with pt who requested to start sessions later in the future in order to allow more time for rest in morning. OT reviewing energy conservation handouts provided yesterday with pt having no questions at this time. Pt returning to supine at end of this session. Bed alarm activated and call bell within reach.   Therapy Documentation Precautions:  Precautions Precautions: Fall Precaution Comments: watch O2 sats, attempt to wean Restrictions Weight Bearing Restrictions: No General:   Vital Signs: Therapy Vitals Pulse Rate: 95 Resp: 18 Patient Position (if appropriate): Lying Oxygen Therapy SpO2: 97 % O2 Device: Nasal Cannula O2 Flow Rate (L/min): 4 L/min FiO2 (%): 36 % Pain:   ADL: ADL Grooming: Contact guard Where Assessed-Grooming: Sitting at sink Upper Body Bathing: Supervision/safety, Setup Where Assessed-Upper Body Bathing: Sitting at sink Lower Body Bathing: Moderate assistance Where Assessed-Lower Body Bathing: Sitting at sink,  Standing at sink Upper Body Dressing: Minimal assistance Where Assessed-Upper Body Dressing: Sitting at sink Lower Body Dressing: Moderate assistance Where Assessed-Lower Body Dressing: Sitting at sink, Standing at sink Toileting: Minimal assistance Toilet Transfer: Minimal assistance Toilet Transfer Method: Stand pivot Vision   Perception    Praxis   Exercises:   Other Treatments:     Therapy/Group: Individual Therapy  Gypsy Decant 12/04/2018, 9:42 AM

## 2018-12-04 NOTE — Progress Notes (Signed)
Physical Therapy Session Note  Patient Details  Name: KLARK VANDERHOEF MRN: [id number] Date of Birth: [birth year]  Today's Date: 12/04/2018 PT Individual Time: 1500-1515 PT Individual Time Calculation (min): 15 min  PT Missed Time: 15 min Missed Time Reason: fatigue  Short Term Goals: Week 1:  PT Short Term Goal 1 (Week 1): Pt will ambulate 64' w/ LRAD w/ supevision PT Short Term Goal 2 (Week 1): Pt will initiate stair training PT Short Term Goal 3 (Week 1): Pt will participate in 30 min of upright mobility w/o increase in fatigue PT Short Term Goal 4 (Week 1): Pt will maintain dynamic standing balance w/ supervision  Skilled Therapeutic Interventions/Progress Updates:    Pt received seated in w/c in room, reports feeling very fatigued this PM and requests to return to bed. Pt on 4L O2 at rest, SpO2 95%. Sit to stand with min A to RW. Stand pivot transfer w/c to bed with min A. Sit to supine Supervision. Pt becomes SOA with exertion, SpO2 drops to 83% while on 4L O2, returns to 90%(+) with rest break and pursed lip breathing. Pt left semi-reclined in bed with needs in reach at end of session. Pt missed 15 min of scheduled therapy due to fatigue.  Therapy Documentation Precautions:  Precautions Precautions: Fall Precaution Comments: watch O2 sats, attempt to wean Restrictions Weight Bearing Restrictions: No    Therapy/Group: Individual Therapy   Excell Seltzer, PT, DPT  12/04/2018, 3:24 PM

## 2018-12-05 ENCOUNTER — Inpatient Hospital Stay (HOSPITAL_COMMUNITY): Payer: Medicare HMO | Admitting: Occupational Therapy

## 2018-12-05 ENCOUNTER — Inpatient Hospital Stay (HOSPITAL_COMMUNITY): Payer: Medicare HMO | Admitting: Physical Therapy

## 2018-12-05 DIAGNOSIS — J9611 Chronic respiratory failure with hypoxia: Secondary | ICD-10-CM

## 2018-12-05 DIAGNOSIS — J988 Other specified respiratory disorders: Secondary | ICD-10-CM

## 2018-12-05 DIAGNOSIS — U071 COVID-19: Secondary | ICD-10-CM

## 2018-12-05 LAB — BASIC METABOLIC PANEL
Anion gap: 11 (ref 5–15)
BUN: 28 mg/dL — ABNORMAL HIGH (ref 8–23)
CO2: 25 mmol/L (ref 22–32)
Calcium: 8.6 mg/dL — ABNORMAL LOW (ref 8.9–10.3)
Chloride: 99 mmol/L (ref 98–111)
Creatinine, Ser: 1.53 mg/dL — ABNORMAL HIGH (ref 0.61–1.24)
GFR calc Af Amer: 50 mL/min — ABNORMAL LOW (ref >60)
GFR calc non Af Amer: 44 mL/min — ABNORMAL LOW (ref >60)
Glucose, Bld: 220 mg/dL — ABNORMAL HIGH (ref 70–99)
Potassium: 4 mmol/L (ref 3.5–5.1)
Sodium: 135 mmol/L (ref 135–145)

## 2018-12-05 LAB — GLUCOSE, CAPILLARY
Glucose-Capillary: 170 mg/dL — ABNORMAL HIGH (ref 70–99)
Glucose-Capillary: 118 mg/dL — ABNORMAL HIGH (ref 70–99)
Glucose-Capillary: 258 mg/dL — ABNORMAL HIGH (ref 70–99)
Glucose-Capillary: 129 mg/dL — ABNORMAL HIGH (ref 70–99)

## 2018-12-05 MED ORDER — IPRATROPIUM-ALBUTEROL 0.5-2.5 (3) MG/3ML IN SOLN: 3.0000 mL | Freq: Four times a day (QID) | RESPIRATORY_TRACT | Status: DC | PRN

## 2018-12-05 MED ORDER — INSULIN ASPART PROT & ASPART (70-30 MIX) 100 UNIT/ML ~~LOC~~ SUSP
21.0000 [IU] | Freq: Every day | SUBCUTANEOUS | Status: DC
  Administered 2018-12-05 – 2018-12-06 (×2): 21 [IU] via SUBCUTANEOUS

## 2018-12-05 NOTE — Progress Notes (Signed)
Physical Therapy Session Note  Patient Details  Name: Andrew Larsen MRN: [id number] Date of Birth: 1940-05-30  Today's Date: 12/05/2018 PT Individual Time: [phone] and 1107-1204 PT Individual Time Calculation (min): 59 min and 57 min  Short Term Goals: Week 1:  PT Short Term Goal 1 (Week 1): Pt will ambulate 33' w/ LRAD w/ supevision PT Short Term Goal 2 (Week 1): Pt will initiate stair training PT Short Term Goal 3 (Week 1): Pt will participate in 30 min of upright mobility w/o increase in fatigue PT Short Term Goal 4 (Week 1): Pt will maintain dynamic standing balance w/ supervision  Skilled Therapeutic Interventions/Progress Updates:    Session 1: Pt reports he is feeling overly tired/fatigued this morning and is having increased pressure in his chest but is agreeable to therapy session - reports that the increased chest pressure lessens during session. Pt received supine in bed on 3.5L of O2 - SpO2 91-92% HR 98bpm at rest. Supine>sit, HOB partially elevated and using bedrails with supervision and significantly increased time. HR elevated to 118bpm and SpO2 increased to 94% but then decreased back to 91-92% and HR 96bpm while seated on EOB. Transitioned to portable O2 tank on 4L of O2 with reinforcing pt education regarding how to read tank fill level and how to turn on oxygen. Sit<>stands elevated EOB<>RW with CGA for steadying throughout session. Ambulated ~31ft x3 using RW with CGA for steadying vitals per trial detailed below:  1st: pt on 4L of O2 - After ambulating SpO2 decreased to 76% while on 4L and HR elevated to 102bpm - seated rest break and O2 increased to 6L with pt's SpO2 recovering to 85% within 30 seconds and then 92% within a minute - pt was very short of breath after walking  Maintained pt on 6L of O2 during ambulatory activity for remainder of session  2nd: - After ambulating SpO2 decreased to 86% and recovered to 90% within 30 seconds while HR elevated to 104bpm and  decreased down to 93bpm all with seated rest break  3rd: SpO2 decreased to 87%, HR increased 103bpm then within 25seconds SpO2 90% and within 38minute 93%; HR 95bpm - pt was minimally short of breath compared to after 1st walk Standing marching with B UE support on RW and CGA for steadying and cuing for increased knee height for 25seconds then SpO2 decreased to 79%, HR increased to 105bpm - within a minute SpO2 increased to 85% and in 2 minutes 93% and HR decreased to 90bpm Stand pivot EOB>w/c using RW with CGA for steadying. Pt left sitting in w/c, reattached to wall O2 at 3.5L, needs in reach, and seat belt alarm on.  Pt requires several minutes of seated rest during session with cuing for increased depth of breathing and improved posture to recover vitals fully prior to initiating more activity.    Session 2: Pt received sitting in w/c and agreeable to therapy session. Reports his bottom is sore and that he has a hx of prior wounds - RN notified and present and end of session to assess skin integrity - pt educated on importance of rolling in bed every hour for pressure relief.   Pt received on 3.5L of O2 transitioned to 4L on portable tank with SpO2 99% and HR 95bpm at rest. Transported to/from gym in w/c. Stand pivot w/c<>[place] using RW with CGA for steadying.   Performed B UE and B LE reciprocal movements on Nustep against level 4 resistance focusing on cardiopulmonary endurance training -  assessed HR and SpO2 every 2 minutes as detailed below with pt maintain on 4L throughout: @ 2 minutes: SpO2 92%, HR 106bpm Borg Rate of Perceived Exertion (RPE) 12 @ 4 minutes: SpO2 91% HR 106bpm Borg RPE 16-17; SpO2 increased to 96% with rest break @ 6 minutes: SpO2 90%, HR 103bpm Borg REP 18-19  Therapist spoke with PA regarding pt's decrease in SpO2 during earlier AM session and Pam, PA, requests therapy not wean pt too quickly off of O2 and to try maintaining his saturation >85%.   Pt requesting to  return to room to urinate - transferred and transported back as described above. Increased to 6L of O2 while pt ambulated ~59ft using RW with CGA to toilet. Standing with CGA for continent urination. Stand pivot to w/c using RW with CGA as pt becoming short of breath. SpO2 92% and HR 104bpm seated rest break provided. On 6L of O2 ambulated ~20ft using RW with CGA for steadying and SpO2 decreased to 87% and HR 104bpm with pt's Borg RPE 18-19 - seated rest break provided and SpO2 90% within 15 seconds and 94% within 30 seconds. Pt left sitting in w/c, reconnected to wall O2 at 3.5L and RN present to assume care of patient.  Therapy Documentation Precautions:  Precautions Precautions: Fall Precaution Comments: watch O2 sats, attempt to wean Restrictions Weight Bearing Restrictions: No  Pain:   Session 1: No complaints of pain.  Session 2: Reports soreness on his bottom - RN notified and present at end of session to assess skin integrity.   Therapy/Group: Individual Therapy  Tawana Scale, PT, DPT 12/05/2018, 7:49 AM

## 2018-12-05 NOTE — Consult Note (Signed)
NAME:  Andrew Larsen, MRN:  [id number], DOB:  Dec 20, 1940, LOS: 4 ADMISSION DATE:  12/01/2018, CONSULTATION DATE:  12/05/18 REFERRING MD:  Posey Pronto - PMR, CHIEF COMPLAINT:  Hypoxia   Brief History   78 yo M recently discharged from [facility] s/p COVID-19 PNA (admitted 7/6), admitted to CIR due to functional decline 8/3. PCCM consulted 8/7 as patient is exhibiting desaturation during exercise and continues to require supplemental O2.   History of present illness   78 yo M PMH CKD, HTN, DM2, OSA, PAD, inclusion body myositis, COVID-19 PNA. Admitted to [facility] 7/6 with COVID-19 requiring hospitalization. PCCM at this time recommended SpO2 goal 85% or greater at rest and 75% or greater with movement. The patient was treated with remdesivir, tocilizumab, steroids and convalescent plasma. After COVID-19 negative, patient was discharged and admitted to CIR due to debility on 8/3. In rehab, patient continues to require oxygen and desaturates during 3hr rehab. Patient requiring 4-6L [place]. Patient endorses intermittent non-productive cough, dyspnea. Denies dizziness, chest pain. PCCM consulted 8/7 for further evaluation.   Past Medical History  GERD HLD HTN CKD DM2 OSA PAD  Myositis COVID-19 PNA survivor  Significant Hospital Events   7/6> admitted to [facility], COVID-19 positive 8/3> Admitted to CIR 8/7> PCCM consulted   Consults:  PCCM   Procedures:    Significant Diagnostic Tests:  CXR 8/7 persistent ASD, small bilateral pleural effusions   Micro Data:  SARS CoV2 positive 7/6 SARS CoV2 not detected 7/29, 7/26  Antimicrobials:    Interim history/subjective:  Patient seated in wheelchair, comfortable appearing on 3LNC.  Objective   Blood pressure 104/69, pulse (!) 103, temperature 98.2 F (36.8 C), temperature source Oral, resp. rate 18, height 5\' 11"  (1.803 m), weight 111.6 kg, SpO2 97 %.        Intake/Output Summary (Last 24 hours) at 12/05/2018 1350 Last data filed at 12/05/2018 0836 Gross per  24 hour  Intake 779 ml  Output 2175 ml  Net -1396 ml   Filed Weights   12/01/18 1200 12/02/18 0620  Weight: 114.5 kg 111.6 kg    Examination: General: Chronically ill appearing older adult M, seated in wheelchair on 3LNC, NAD HENT: NCAT. Pink mmm. 3LNC in appropriate position. Trachea midline  Lungs: CTA bilaterally. No accessory muscle recruitment. Shallow respirations with symmetrical chest expansion Cardiovascular: s1s2 no rgm. 2+ radial pulses Abdomen: Soft, round, ndnt Extremities: Symmetrical bulk and tone.  Neuro: AAOx4, Following commands GU: deferred   Resolved Hospital Problem list     Assessment & Plan:  Chronic hypoxic resp failure:   -s/p covid pneumonia -on weaning oxygen levels d/c'd with 4-6 and at time of our exam pt was on 3L -pt reports improvement in exercise capacity and sob -utilizes IS infrequently, encouraged increase in use -cxr stable from previous on 7/31 -cont with rehab.  -in regards to pleural effusions noted on cxr they are small and risk vs benefits would not attempt any drainage -perhaps an echo would be of benefit to see if cardiac function is playing a role and with development of the small effusions diuresis could be of benefit if renal function were to allow.   -will defer to primary on the ordering of this -sat goal 88% at rest   CCM will sign off. Please call with further questions  Best practice:  Diet: reg Pain/Anxiety/Delirium protocol (if indicated): not indicated VAP protocol (if indicated): not indicated DVT prophylaxis: enoxaparin GI prophylaxis: famotidine Glucose control: ssi Mobility: per rehab Code Status: full  Family Communication: d/w pt Disposition: rehab  Labs   CBC: Recent Labs  Lab 12/02/18 0909  WBC 3.8*  NEUTROABS 1.5*  HGB 13.5  HCT 40.4  MCV 93.1  PLT 132*    Basic Metabolic Panel: Recent Labs  Lab 12/02/18 0909 12/05/18 0840  NA 134* 135  K 4.4 4.0  CL 95* 99  CO2 29 25  GLUCOSE 275*  220*  BUN 27* 28*  CREATININE 1.61* 1.53*  CALCIUM 9.0 8.6*   GFR: Estimated Creatinine Clearance: 52.2 mL/min (A) (by C-G formula based on SCr of 1.53 mg/dL (H)). Recent Labs  Lab 12/02/18 0909  WBC 3.8*    Liver Function Tests: Recent Labs  Lab 12/02/18 0909  AST 20  ALT 30  ALKPHOS 55  BILITOT 0.8  PROT 5.3*  ALBUMIN 3.1*   No results for input(s): LIPASE, AMYLASE in the last 168 hours. No results for input(s): AMMONIA in the last 168 hours.  ABG    Component Value Date/Time   PHART 7.459 (H) 11/07/2018 1312   PCO2ART 30.5 (L) 11/07/2018 1312   PO2ART 46.0 (L) 11/07/2018 1312   HCO3 21.7 11/07/2018 1312   TCO2 23 11/07/2018 1312   ACIDBASEDEF 1.0 11/07/2018 1312   O2SAT 85.0 11/07/2018 1312     Coagulation Profile: No results for input(s): INR, PROTIME in the last 168 hours.  Cardiac Enzymes: No results for input(s): CKTOTAL, CKMB, CKMBINDEX, TROPONINI in the last 168 hours.  HbA1C: Hgb A1c MFr Bld  Date/Time Value Ref Range Status  11/08/2018 05:00 AM 7.3 (H) 4.8 - 5.6 % Final    Comment:    (NOTE) Pre diabetes:          5.7%-6.4% Diabetes:              >6.4% Glycemic control for   <7.0% adults with diabetes   11/07/2018 05:00 AM 7.2 (H) 4.8 - 5.6 % Final    Comment:    (NOTE)         Prediabetes: 5.7 - 6.4         Diabetes: >6.4         Glycemic control for adults with diabetes: <7.0     CBG: Recent Labs  Lab 12/04/18 1210 12/04/18 1658 12/04/18 2116 12/05/18 0639 12/05/18 1156  GLUCAP 119* 183* 154* 170* 118*    Review of Systems:   Sob with activity, cough intermittently, no dizziness, no sputum, no ha/blurred vision. Feels like exercise capacity is improving. All other ROS negative  Past Medical History  He,  has a past medical history of CKD (chronic kidney disease), Daytime somnolence, Diabetes mellitus without complication ([place]), Fracture, GERD (gastroesophageal reflux disease), Glaucoma, Hypercholesteremia, Hyperlipidemia,  Hypertension, Mold exposure, PAD (peripheral artery disease) ([place]), and Sleep apnea.   Surgical History    Past Surgical History:  Procedure Laterality Date  . LOWER EXTREMITY ANGIOGRAPHY N/A 06/12/2016   Procedure: Lower Extremity Angiography;  Surgeon: Adrian Prows, MD;  Location: [place] [facility];  Service: Cardiovascular;  Laterality: N/A;  . MUSCLE BIOPSY Right 05/06/2018   Procedure: right vastus lateralis muscle biopsy;  Surgeon: Earnie Larsson, MD;  Location: [place];  Service: Neurosurgery;  Laterality: Right;  . ORIF ANKLE FRACTURE Right 10/02/2012   Procedure: OPEN REDUCTION INTERNAL FIXATION (ORIF) ANKLE FRACTURE;  Surgeon: Meredith Pel, MD;  Location: [facility];  Service: Orthopedics;  Laterality: Right;  . PERIPHERAL VASCULAR ATHERECTOMY Left 06/12/2016   Procedure: Peripheral Vascular Atherectomy-Left Popliteal;  Surgeon: Adrian Prows, MD;  Location:  [place] [facility];  Service: Cardiovascular;  Laterality: Left;  . PERIPHERAL VASCULAR INTERVENTION Left 06/12/2016   Procedure: Peripheral Vascular Intervention- DCB Left Popliteal;  Surgeon: Adrian Prows, MD;  Location: [place] [facility];  Service: Cardiovascular;  Laterality: Left;     Social History   reports that he quit smoking about 37 years ago. He has a 20.00 pack-year smoking history. He has never used smokeless tobacco. He reports that he does not drink alcohol or use drugs.   Family History   His family history includes Cancer in his brother; Diabetes in his brother; Heart disease in his father; Prostate cancer in his brother. There is no history of Colon cancer.   Allergies Allergies  Allergen Reactions  . Metformin And Related   . Simbrinza [Brinzolamide-Brimonidine]     Drainage, redness to eyes     Home Medications  Prior to Admission medications   Medication Sig Start Date End Date Taking? Authorizing Provider  aspirin 81 MG chewable tablet Chew 81 mg by mouth at bedtime.     [provider]   brimonidine-timolol (COMBIGAN) 0.2-0.5 % ophthalmic solution Place 1 drop into both eyes every 12 (twelve) hours.    [provider]  calcium carbonate (CALCIUM 600) 600 MG TABS tablet Take 1,200 mg by mouth daily.     [provider]  Cholecalciferol (VITAMIN D3) 5000 units TABS Take 10,000 Units by mouth daily.     [provider]  cilostazol (PLETAL) 100 MG tablet Take 100 mg by mouth 2 (two) times daily.    [provider]  Evolocumab (REPATHA) 140 MG/ML SOSY Inject 140 mg into the skin every 14 (fourteen) days.    [provider]  famotidine (PEPCID) 20 MG tablet Take 20 mg by mouth at bedtime.    [provider]  furosemide (LASIX) 40 MG tablet Take 40 mg by mouth daily.    [provider]  gabapentin (NEURONTIN) 300 MG capsule Take 600 mg by mouth 3 (three) times daily.     [provider]  insulin NPH-regular Human (NOVOLIN 70/30) (70-30) 100 UNIT/ML injection Inject 30-50 Units into the skin See admin instructions. Inject 50 units subcutaneously in am and 30 units in the evening    [provider]  latanoprost (XALATAN) 0.005 % ophthalmic solution Place 1 drop into both eyes at bedtime.    [provider]  magnesium gluconate (MAGONATE) 500 MG tablet Take 500 mg by mouth daily.    [provider]  Multiple Vitamin (MULTIVITAMIN) capsule Take 1 capsule by mouth daily.     [provider]  olmesartan (BENICAR) 20 MG tablet Take 20 mg by mouth daily.    [provider]  oxybutynin (DITROPAN) 5 MG tablet Take 5 mg by mouth 2 (two) times daily.    [provider]  potassium chloride SA (K-DUR,KLOR-CON) 20 MEQ tablet Take 20 mEq by mouth daily.    [provider]  tamsulosin (FLOMAX) 0.4 MG CAPS capsule Take 0.4 mg by mouth daily.    [provider]  valsartan (DIOVAN) 160 MG tablet Take 160 mg by mouth daily. 09/12/18   [provider]     Consult Lvl 3  Audria Nine DO Pager: [phone] After hours pager: [phone]  [facility] 12/05/2018, 2:18 PM

## 2018-12-05 NOTE — Progress Notes (Signed)
Occupational Therapy Session Note  Patient Details  Name: Andrew Larsen MRN: [id number] Date of Birth: 1941-03-29  Today's Date: 12/05/2018 OT Individual Time: 1345-1455 OT Individual Time Calculation (min): 70 min    Short Term Goals: Week 1:  OT Short Term Goal 1 (Week 1): Pt will complete 1 grooming task in standing to demonstrate increased standing tolerance OT Short Term Goal 2 (Week 1): Pt will complete toilet transfers with LRAD with supervision OT Short Term Goal 3 (Week 1): Pt will complete sit > stand with supervision to complete LB dressing/hygiene  Skilled Therapeutic Interventions/Progress Updates:    Pt seen for OT ADL bathing/dressing session. Pt sitting up in w/c upon arrival, denying pain and agreeable to tx session. No complaints of pain, however, fatigued from previous tx sessions, willing to participate as able.  Pt at 93% O2 at rest on 3.5 L supplemental O2. During bathing task at shower level, supplemental O2 raised to 5L and pt destating to 81%, HR 124. Supplemental O2 raised to 6L and VCs for deep breathing technique.  Pt taken into bathroom in w/c for energy conservation. He completed stand pivot transfers with RW and CGA throughout session with VCs for sequencing and awareness of O2 line. Pt bathed seated on padded tub bench in shower. Supervision overall, completing lateral leans for buttock hygiene and able to reach down to wash B feet.  Pt returned to w/c and donned hospital gown. He completed min A stand pivot transfer back to bed at end of session, left in supine with all needs in reach and bed alarm on. Pt requiring significantly increased time and rest breaks following each transfer/transition and during bathing/dressing task. VCs/education provided throughout regarding energy conservation techniques and d/c planning.   Therapy Documentation Precautions:  Precautions Precautions: Fall Precaution Comments: watch O2 sats, attempt to wean Restrictions Weight  Bearing Restrictions: No   Therapy/Group: Individual Therapy  Shamecka Hocutt L 12/05/2018, 7:10 AM

## 2018-12-05 NOTE — Progress Notes (Addendum)
[facility] PHYSICAL MEDICINE & REHABILITATION PROGRESS NOTE  Subjective/Complaints: Patient seen laying in bed this morning.  He states he slept well overnight.  He denies any complaints.  He notes improvement in cough.  He states he has not received his flutter valve, discussed with nursing.  ROS: Denies CP, shortness of breath, N/V/D.  Objective: Vital Signs: Blood pressure 105/81, pulse (!) 102, temperature 98.1 F (36.7 C), temperature source Oral, resp. rate 20, height 5\' 11"  (1.803 m), weight 111.6 kg, SpO2 100 %. Dg Chest 2 View  Result Date: 12/04/2018 CLINICAL DATA:  Shortness of breath, COVID-19 positive EXAM: CHEST - 2 VIEW COMPARISON:  Radiograph 11/28/2018 FINDINGS: Worsening diffuse bilateral airspace disease. Small bilateral effusions, right greater than left. No pneumothorax. Stable cardiomegaly. No acute osseous or soft tissue abnormality. Degenerative changes are present in the imaged spine and shoulders. IMPRESSION: Worsening diffuse bilateral airspace disease and small bilateral effusions. Electronically Signed   By: Lovena Le M.D.   On: 12/04/2018 14:29   No results for input(s): WBC, HGB, HCT, PLT in the last 72 hours. Recent Labs    12/05/18 0840  NA 135  K 4.0  CL 99  CO2 25  GLUCOSE 220*  BUN 28*  CREATININE 1.53*  CALCIUM 8.6*    Physical Exam: BP 105/81 (BP Location: Left Arm)   Pulse (!) 102   Temp 98.1 F (36.7 C) (Oral)   Resp 20   Ht 5\' 11"  (1.803 m)   Wt 111.6 kg   SpO2 100%   BMI 34.31 kg/m  Constitutional: No distress . Vital signs reviewed. HENT: Normocephalic.  Atraumatic. Eyes: EOMI. No discharge. Cardiovascular: No JVD. Respiratory: Normal effort.  No stridor.  + [place]. GI: Non-distended. Skin: Warm and dry.  Intact. Psych: Normal mood.  Normal behavior. Musc: Bilateral lower extremity edema, improving Neurological: Alert Motor: B/l UE 4+/5 proximal to distal, unchanged LLE: 4/5  RLE: 4-/5 HF, KE and 4/5 ADF/PF.    Assessment/Plan: 1. Functional deficits secondary to debility which require 3+ hours per day of interdisciplinary therapy in a comprehensive inpatient rehab setting.  Physiatrist is providing close team supervision and 24 hour management of active medical problems listed below.  Physiatrist and rehab team continue to assess barriers to discharge/monitor patient progress toward functional and medical goals  Care Tool:  Bathing    Body parts bathed by patient: Right arm, Left arm, Chest, Abdomen, Front perineal area, Right upper leg, Left upper leg, Face   Body parts bathed by helper: Buttocks, Right lower leg, Left lower leg     Bathing assist Assist Level: Minimal Assistance - Patient > 75%     Upper Body Dressing/Undressing Upper body dressing   What is the patient wearing?: Pull over shirt    Upper body assist Assist Level: Set up assist    Lower Body Dressing/Undressing Lower body dressing      What is the patient wearing?: Pants     Lower body assist Assist for lower body dressing: Moderate Assistance - Patient 50 - 74%     Toileting Toileting    Toileting assist Assist for toileting: Set up assist Assistive Device Comment: Urinal   Transfers Chair/bed transfer  Transfers assist     Chair/bed transfer assist level: Minimal Assistance - Patient > 75%     Locomotion Ambulation   Ambulation assist      Assist level: Contact Guard/Touching assist Assistive device: Walker-rolling Max distance: 25'   Walk 10 feet activity   Assist  Assist level: Contact Guard/Touching assist Assistive device: Walker-rolling   Walk 50 feet activity   Assist Walk 50 feet with 2 turns activity did not occur: Safety/medical concerns         Walk 150 feet activity   Assist Walk 150 feet activity did not occur: Safety/medical concerns         Walk 10 feet on uneven surface  activity   Assist Walk 10 feet on uneven surfaces activity did not  occur: Safety/medical concerns         Wheelchair     Assist Will patient use wheelchair at discharge?: No   Wheelchair activity did not occur: N/A         Wheelchair 50 feet with 2 turns activity    Assist    Wheelchair 50 feet with 2 turns activity did not occur: N/A       Wheelchair 150 feet activity     Assist Wheelchair 150 feet activity did not occur: N/A          Medical Problem List and Plan: 1.Functional and mobility deficitssecondary to debility after ARDS, COVID  Continue CIR  Repeat chest x-ray reviewed, worsening.  Will discuss with Pulm given Pulm history.  Acapella ordered, discussed with nursing 2. Antithrombotics: -DVT/anticoagulation:Pharmaceutical:Lovenox -antiplatelet therapy: ASA 81 3. Pain Management:tylenol prn  Gabapentin 600 3 times daily  Appears to be stable on 8/7 4. Mood:LCSW to follow for evaluation and support. -antipsychotic agents: N/a 5. Neuropsych: This patientiscapable of making decisions onhisown behalf. 6. Skin/Wound Care:Air mattress overlay. Local care to sacral decub. 7. Fluids/Electrolytes/Nutrition:Monitor I/Os.  8. HTN: Monitor BP   Continue Lasix  Continue metoprolol 25  Controlled on 8/7  Monitor with increased mobility 9. CKD III:   Cr.  1.53 on 8/7, labs ordered for Monday  Cont to monitor 9. T2DM with hyperglycemia: Hgb A1C- 7.3.   SSI  NovoLog 70/30 18 units at bedtime, increased to 21 on 8/7  NovoLog 70/30 34 units daily  Labile on 8/7  Monitor with increased mobility  10. CAD: Continue Lasix and lopressor.  Filed Weights   12/01/18 1200 12/02/18 0620  Weight: 114.5 kg 111.6 kg   Daily weights ordered  ?  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  12. OSA: Question ability to transition to CPAP 13. Thrombocytopenia:   Plts 132 on 8/4, labs ordered for Monday  Cont to monitor 14. Hyponatremia:   Sodium 135  on 8/7  Cont to monitor 15. Cough/Hypoxia: Encourage IS.   Wean supplemental oxygen as tolerated, continues to require on 8/6 Donebs QID with cough medications prn. 16. Hypoalbuminemia  Supplement initiated on 8/4 17. Leukopenia  WBCs 3.8 on 8/4, labs ordered for Monday  Cont to monitor 18.  Tachycardia-secondary deconditioning  Continue metoprolol 25  ?  Improving on 8/7 19.  Urinary frequency  Continue Ditropan 5    LOS: 4 days A FACE TO FACE EVALUATION WAS PERFORMED  Margrit Minner Lorie Phenix 12/05/2018, 10:45 AM

## 2018-12-06 DIAGNOSIS — R000 Tachycardia, unspecified: Secondary | ICD-10-CM

## 2018-12-06 DIAGNOSIS — Z9981 Dependence on supplemental oxygen: Secondary | ICD-10-CM

## 2018-12-06 DIAGNOSIS — E1165 Type 2 diabetes mellitus with hyperglycemia: Secondary | ICD-10-CM

## 2018-12-06 LAB — GLUCOSE, CAPILLARY
Glucose-Capillary: 152 mg/dL — ABNORMAL HIGH (ref 70–99)
Glucose-Capillary: 141 mg/dL — ABNORMAL HIGH (ref 70–99)
Glucose-Capillary: 176 mg/dL — ABNORMAL HIGH (ref 70–99)
Glucose-Capillary: 210 mg/dL — ABNORMAL HIGH (ref 70–99)

## 2018-12-06 NOTE — Progress Notes (Signed)
[facility] PHYSICAL MEDICINE & REHABILITATION PROGRESS NOTE  Subjective/Complaints: States he slept well. Denies new sob or cough.  ROS: Patient denies fever, rash, sore throat, blurred vision, nausea, vomiting, diarrhea,   joint or back pain, headache, or mood change. .  Objective: Vital Signs: Blood pressure 102/60, pulse 94, temperature 98.5 F (36.9 C), temperature source Oral, resp. rate 18, height 5\' 11"  (1.803 m), weight 112.2 kg, SpO2 97 %. Dg Chest 2 View  Result Date: 12/04/2018 CLINICAL DATA:  Shortness of breath, COVID-19 positive EXAM: CHEST - 2 VIEW COMPARISON:  Radiograph 11/28/2018 FINDINGS: Worsening diffuse bilateral airspace disease. Small bilateral effusions, right greater than left. No pneumothorax. Stable cardiomegaly. No acute osseous or soft tissue abnormality. Degenerative changes are present in the imaged spine and shoulders. IMPRESSION: Worsening diffuse bilateral airspace disease and small bilateral effusions. Electronically Signed   By: Lovena Le M.D.   On: 12/04/2018 14:29   No results for input(s): WBC, HGB, HCT, PLT in the last 72 hours. Recent Labs    12/05/18 0840  NA 135  K 4.0  CL 99  CO2 25  GLUCOSE 220*  BUN 28*  CREATININE 1.53*  CALCIUM 8.6*    Physical Exam: BP 102/60 (BP Location: Right Arm)   Pulse 94   Temp 98.5 F (36.9 C) (Oral)   Resp 18   Ht 5\' 11"  (1.803 m)   Wt 112.2 kg   SpO2 97%   BMI 34.50 kg/m  Constitutional: No distress . Vital signs reviewed. HEENT: EOMI, oral membranes moist Neck: supple Cardiovascular: RRR without murmur. No JVD    Respiratory: decreased sounds at bases. Decreased air movement overall. 4L oxygen [place]   GI: BS +, non-tender, non-distended  Skin: Warm and dry.  Intact. Psych: Normal mood.  Normal behavior. Musc: Bilateral lower extremity edema, decreased Neurological: Alert Motor: B/l UE 4+/5 proximal to distal, unchanged LLE: 4/5  RLE: 4-/5 HF, KE and 4/5 ADF/PF--stable Psych: cooperative.    Assessment/Plan: 1. Functional deficits secondary to debility which require 3+ hours per day of interdisciplinary therapy in a comprehensive inpatient rehab setting.  Physiatrist is providing close team supervision and 24 hour management of active medical problems listed below.  Physiatrist and rehab team continue to assess barriers to discharge/monitor patient progress toward functional and medical goals  Care Tool:  Bathing    Body parts bathed by patient: Right arm, Left arm, Chest, Abdomen, Front perineal area, Right upper leg, Left upper leg, Face, Buttocks, Right lower leg, Left lower leg   Body parts bathed by helper: Buttocks, Right lower leg, Left lower leg     Bathing assist Assist Level: Supervision/Verbal cueing     Upper Body Dressing/Undressing Upper body dressing   What is the patient wearing?: Hospital gown only    Upper body assist Assist Level: Contact Guard/Touching assist    Lower Body Dressing/Undressing Lower body dressing      What is the patient wearing?: Hospital gown only     Lower body assist Assist for lower body dressing: Moderate Assistance - Patient 50 - 74%     Toileting Toileting    Toileting assist Assist for toileting: Set up assist Assistive Device Comment: Urinal   Transfers Chair/bed transfer  Transfers assist     Chair/bed transfer assist level: Contact Guard/Touching assist     Locomotion Ambulation   Ambulation assist      Assist level: Contact Guard/Touching assist Assistive device: Walker-rolling Max distance: 48ft   Walk 10 feet activity  Assist     Assist level: Contact Guard/Touching assist Assistive device: Walker-rolling   Walk 50 feet activity   Assist Walk 50 feet with 2 turns activity did not occur: Safety/medical concerns         Walk 150 feet activity   Assist Walk 150 feet activity did not occur: Safety/medical concerns         Walk 10 feet on uneven surface   activity   Assist Walk 10 feet on uneven surfaces activity did not occur: Safety/medical concerns         Wheelchair     Assist Will patient use wheelchair at discharge?: No   Wheelchair activity did not occur: N/A         Wheelchair 50 feet with 2 turns activity    Assist    Wheelchair 50 feet with 2 turns activity did not occur: N/A       Wheelchair 150 feet activity     Assist Wheelchair 150 feet activity did not occur: N/A          Medical Problem List and Plan: 1.Functional and mobility deficitssecondary to debility after ARDS, COVID  Continue CIR   Acapella  discussed with nursing 2. Antithrombotics: -DVT/anticoagulation:Pharmaceutical:Lovenox -antiplatelet therapy: ASA 81 3. Pain Management:tylenol prn  Gabapentin 600 3 times daily  Appears to be stable on 8/8 4. Mood:LCSW to follow for evaluation and support. -antipsychotic agents: N/a 5. Neuropsych: This patientiscapable of making decisions onhisown behalf. 6. Skin/Wound Care:Air mattress overlay. Local care to sacral decub. 7. Fluids/Electrolytes/Nutrition:Monitor I/Os.  8. HTN: Monitor BP   Continue Lasix  Continue metoprolol 25  Controlled on 8/8  Monitor with increased mobility 9. CKD III:   Cr.  1.53 on 8/7, labs ordered for Monday  Cont to monitor 9. T2DM with hyperglycemia: Hgb A1C- 7.3.   SSI  NovoLog 70/30 18 units at bedtime, increased to 21 on 8/7  NovoLog 70/30 34 units daily  Improving control. Continue to monitor  10. CAD: Continue Lasix and lopressor.  Filed Weights   12/01/18 1200 12/02/18 0620 12/06/18 0411  Weight: 114.5 kg 111.6 kg 112.2 kg   Daily weights ordered  ?  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  12. OSA: Question ability to transition to CPAP 13. Thrombocytopenia:   Plts 132 on 8/4, labs ordered for Monday  Cont to monitor 14. Hyponatremia:   Sodium 135 on  8/7  Cont to monitor 15. Cough/Hypoxic respiratory failure: Encourage IS, FV  Wean supplemental oxygen as tolerated, continues to require  Donebs QID with cough medications prn.  -reviewed CXR which was read as worsening but looks no different from last  -appreciate CCM note. They feel that CXR is stable from 7/31 and recommend observation of pleural effusions given risk/benefit of draining. ?ECHO to look at [facility]   -he seems to be making progress with therapies with improved stamina, so we'll defer on further assessment at this time.    -wean oxygen as able   -oxygen goal is >88%   -consider follow up xray depending upon clinical progress.  16. Hypoalbuminemia  Supplement initiated on 8/4 17. Leukopenia  WBCs 3.8 on 8/4, labs ordered for Monday  Cont to monitor 18.  Tachycardia-secondary deconditioning  Continue metoprolol 25  ?  Improving on 8/7 19.  Urinary frequency  Continue Ditropan 5       LOS: 5 days A FACE TO FACE EVALUATION WAS PERFORMED  Meredith Staggers 12/06/2018, 8:49 AM

## 2018-12-07 ENCOUNTER — Inpatient Hospital Stay (HOSPITAL_COMMUNITY): Payer: Medicare HMO

## 2018-12-07 LAB — GLUCOSE, CAPILLARY
Glucose-Capillary: 175 mg/dL — ABNORMAL HIGH (ref 70–99)
Glucose-Capillary: 122 mg/dL — ABNORMAL HIGH (ref 70–99)
Glucose-Capillary: 125 mg/dL — ABNORMAL HIGH (ref 70–99)
Glucose-Capillary: 248 mg/dL — ABNORMAL HIGH (ref 70–99)

## 2018-12-07 MED ORDER — INSULIN ASPART PROT & ASPART (70-30 MIX) 100 UNIT/ML ~~LOC~~ SUSP
23.0000 [IU] | Freq: Every day | SUBCUTANEOUS | Status: DC
  Administered 2018-12-07 – 2018-12-15 (×9): 23 [IU] via SUBCUTANEOUS

## 2018-12-07 MED ORDER — IPRATROPIUM-ALBUTEROL 0.5-2.5 (3) MG/3ML IN SOLN
3.0000 mL | Freq: Two times a day (BID) | RESPIRATORY_TRACT | Status: DC
  Administered 2018-12-07 – 2018-12-10 (×6): 3 mL via RESPIRATORY_TRACT
  Filled 2018-12-07 (×8): qty 3

## 2018-12-07 MED ORDER — IPRATROPIUM-ALBUTEROL 0.5-2.5 (3) MG/3ML IN SOLN
3.0000 mL | Freq: Four times a day (QID) | RESPIRATORY_TRACT | Status: DC
  Administered 2018-12-07: 11:00:00 3 mL via RESPIRATORY_TRACT
  Filled 2018-12-07: qty 3

## 2018-12-07 MED ORDER — INSULIN ASPART PROT & ASPART (70-30 MIX) 100 UNIT/ML ~~LOC~~ SUSP
36.0000 [IU] | Freq: Every day | SUBCUTANEOUS | Status: DC
  Administered 2018-12-08 – 2018-12-16 (×9): 36 [IU] via SUBCUTANEOUS

## 2018-12-07 MED ORDER — DM-GUAIFENESIN ER 30-600 MG PO TB12
1.0000 | ORAL_TABLET | Freq: Two times a day (BID) | ORAL | Status: DC
  Administered 2018-12-07 – 2018-12-16 (×19): 1 via ORAL
  Filled 2018-12-07 (×20): qty 1

## 2018-12-07 NOTE — Progress Notes (Signed)
Physical Therapy Session Note  Patient Details  Name: Andrew Larsen MRN: [id number] Date of Birth: 05-Apr-1941  Today's Date: 12/07/2018 PT Individual Time: 0800-0915 PT Individual Time Calculation (min): 75 min   Short Term Goals: Week 1:  PT Short Term Goal 1 (Week 1): Pt will ambulate 78' w/ LRAD w/ supevision PT Short Term Goal 2 (Week 1): Pt will initiate stair training PT Short Term Goal 3 (Week 1): Pt will participate in 30 min of upright mobility w/o increase in fatigue PT Short Term Goal 4 (Week 1): Pt will maintain dynamic standing balance w/ supervision  Skilled Therapeutic Interventions/Progress Updates:     Patient sitting EOB finishing breakfast upon PT arrival. Patient alert and agreeable to PT session. Patient denied pain this morning, however, reporting indigestion. RN made aware and provided medication, PT provided a diet ginger ale. Patient on 5L O2 SPO2 98%, HR 100, BP 108/95 in sitting at beginning of session. RN dropped O2 down to 4L, SPO2 97% HR 102.     Therapeutic Activity: Transfers: Patient performed sit to/from stand x3 with CGA and a toilet transfer with min A. Provided verbal cues for hand placement on RW and use of grab bar in the bathroom. Required suprvision for peri care. Educated on minimizing straining with bowl movements and diaphragmatic breathing for relaxation and breath support. SPO2 >94%.  Patient sat in the w/c at the sink and washed his face and hands, brushed his teeth, and combed his hair with set-up assist. He doffed his hospital gown and donned a shirt with supervision and donned shorts with min A for pulling then up in standing.  Gait Training:  Patient ambulated 15 feet x2 using RW with CGA on 4L  Trial 1: SPO2 93-94%, HR 117, RPE 8/10 after Trial 2: SPO2 83%, HR 119, RPE 9/10 after, no change in SPO2 after 30 sec, increased to 6L O2 and SPO2 recovered to 92% in 1 min with pursed lip breathing with cues for increased diaphragm  activation. Ambulated 10 feet x1 on 6L O2 SPO2 80%, RPE 10/10, HR 107, recovered to 92% in 2 min with pursed lip breathing with cues for increased diaphragm activation. Ambulated with decreased gait speed, decreased step length and height, and slight forward trunk lean. Provided verbal cues for erect posture for improved breath support and paced breathing with ambulation.  Wheelchair Mobility:  Patient propelled wheelchair 15 feet with supervision using B LEs. Provided verbal cues for locking breaks when sitting still for safety.   Patient in recliner at end of session with breaks locked, chair alarm set, and all needs within reach. SPO2 97% on 3.5 L O2 after 5 min sitting rest break at end of session. RN made aware of O2 saturations during session.    Therapy Documentation Precautions:  Precautions Precautions: Fall Precaution Comments: watch O2 sats, attempt to wean Restrictions Weight Bearing Restrictions: No    Therapy/Group: Individual Therapy  Paisyn Guercio L Argelia Formisano PT, DPT  12/07/2018, 4:20 PM

## 2018-12-07 NOTE — Progress Notes (Signed)
Occupational Therapy Session Note  Patient Details  Name: Andrew Larsen MRN: [id number] Date of Birth: February 02, 1941  Today's Date: 12/07/2018 OT Individual Time: 1455-1606 OT Individual Time Calculation (min): 71 min    Short Term Goals: Week 1:  OT Short Term Goal 1 (Week 1): Pt will complete 1 grooming task in standing to demonstrate increased standing tolerance OT Short Term Goal 2 (Week 1): Pt will complete toilet transfers with LRAD with supervision OT Short Term Goal 3 (Week 1): Pt will complete sit > stand with supervision to complete LB dressing/hygiene  Skilled Therapeutic Interventions/Progress Updates:    Pt received sitting in w/c with no c/o pain. Pt on 4L [place], SPO2 at 95%, HR 105 bpm. Pt completed w/c propulsion 25 ft before requiring rest break and pt was transported rest of the way for time management. Pt completed SPT to mat with no AE, SpO2 remaining at 95% on 4L [place]. Pt completed 2 sets of endurance based BUE exercises with a 1 kg medicine ball. Pt then completed standing mini squats with BUE support, immediately following SpO2 95% and HR 99 bpm. Pt demonstrating improvement with functional activity tolerance this session and maintaining SpO2 on only 4L [place]. Pt completed 2x10  knee extension/flexion exercises with resistance to increase LE strength needed to participate in standing level ADLs. Pt completed BUE strengthening circuit with a level 1 thereabnd, focus on muscles to push up from seated surface. Pt reporting some back tightness so a theraball was used to facilitate pectoralis stretch and back extension. Pt reported pain relief with this positioning. Pt remained > 95% on 4L [place] throughout all exercises. Pt ended session with core stabilization exercises to increase sitting and standing balance. Pt returned to room and left sitting up with chair pad alarm activated, all needs within reach.    Therapy Documentation Precautions:  Precautions Precautions: Fall Precaution  Comments: watch O2 sats, attempt to wean Restrictions Weight Bearing Restrictions: No   Therapy/Group: Individual Therapy  Curtis Sites 12/07/2018, 7:16 AM

## 2018-12-07 NOTE — Progress Notes (Signed)
Patient noted with congestion & coughing continuously. Thick mucus noted. Oral suctioning done but he still had trouble clearing his secretions. He was given his prn robitussin & tessalon pearls, he also received his scheduled mucinex. No relief was noted, so respiratory was called to inquire about his scheduled breathing treatment. Respiratory therapist was not near our unit, so this nurse gave him his schedule duoneb. He was started & at the beginning his oxygen saturation was 90% on 3.5 lpm. After treatment, he was up to 99%. No labored breathing noted, his coughing had quieted down significantly. Respiratory therapist came after treatment completed. No acute distress noted at that time. Will continue to monitor

## 2018-12-07 NOTE — Progress Notes (Signed)
Occupational Therapy Session Note  Patient Details  Name: Andrew Larsen MRN: [id number] Date of Birth: 09/05/1940  Today's Date: 12/07/2018 OT Individual Time: 1330-1425 OT Individual Time Calculation (min): 55 min    Short Term Goals: Week 1:  OT Short Term Goal 1 (Week 1): Pt will complete 1 grooming task in standing to demonstrate increased standing tolerance OT Short Term Goal 2 (Week 1): Pt will complete toilet transfers with LRAD with supervision OT Short Term Goal 3 (Week 1): Pt will complete sit > stand with supervision to complete LB dressing/hygiene  Skilled Therapeutic Interventions/Progress Updates:    Pt resting EOB upon arrival and agreeable to participating in therapy.  OT intervention with focus on sit<>stand, functional amb with RW, functional transfers, and therex to increase endurance and independence with BADLs.  Pt engaged in donning shoes, amb with RW 8', w/c mobility, and therex on NuStep (4 min, level 4) O2 sats: Resting at beginning of session-96% on 3.5L [place] one shoe-83% on 3.5L; 3 mins to recover on 4L Amb 8'-75% on 4L; 1:30 mins to recover on 6L [place] w/c 10'-82% on 4L; 2 mins to recover on 4L Transfer w/c>NuStep-75% on 4L; 1 min to recover on 4L NuStep per above-69% on 4L; 3 mins to recover on 6L  Pt returned to room and remained in w/c with seat alarm activated and all needs within reach.   Therapy Documentation Precautions:  Precautions Precautions: Fall Precaution Comments: watch O2 sats, attempt to wean Restrictions Weight Bearing Restrictions: No  Pain:  Pt denies pain   Therapy/Group: Individual Therapy  Leroy Libman 12/07/2018, 2:38 PM

## 2018-12-07 NOTE — Progress Notes (Signed)
[place] PHYSICAL MEDICINE & REHABILITATION PROGRESS NOTE  Subjective/Complaints: Feels that he's getting stronger. Still having productive cough, sometimes hard to bring it up.   ROS: Patient denies fever, rash, sore throat, blurred vision, nausea, vomiting, diarrhea, cough,  joint or back pain, headache, or mood change.    Objective: Vital Signs: Blood pressure 108/64, pulse 92, temperature 98.2 F (36.8 C), temperature source Oral, resp. rate 19, height 5\' 11"  (1.803 m), weight 112.5 kg, SpO2 96 %. No results found. No results for input(s): WBC, HGB, HCT, PLT in the last 72 hours. Recent Labs    12/05/18 0840  NA 135  K 4.0  CL 99  CO2 25  GLUCOSE 220*  BUN 28*  CREATININE 1.53*  CALCIUM 8.6*    Physical Exam: BP 108/64 (BP Location: Left Arm)   Pulse 92   Temp 98.2 F (36.8 C) (Oral)   Resp 19   Ht 5\' 11"  (1.803 m)   Wt 112.5 kg   SpO2 96%   BMI 34.59 kg/m  Constitutional: No distress . Vital signs reviewed. HEENT: EOMI, oral membranes moist Neck: supple Cardiovascular: RRR without murmur. No JVD    Respiratory: scattered wheezes and rhonchi. Breathing unlabored. Sounds generally tight though. Decreased sounds at bases. Occasional productive cough. O2 [place] GI: BS +, non-tender, non-distended  Skin: Warm and dry.  Intact. Psych: Normal mood.  Normal behavior. Musc: Bilateral lower extremity edema trace Neurological: Alert Motor: B/l UE 4+/5 proximal to distal, unchanged LLE: 4/5  RLE: 4-/5 HF, KE and 4/5 ADF/PF--stable exam Psych: cooperative.   Assessment/Plan: 1. Functional deficits secondary to debility which require 3+ hours per day of interdisciplinary therapy in a comprehensive inpatient rehab setting.  Physiatrist is providing close team supervision and 24 hour management of active medical problems listed below.  Physiatrist and rehab team continue to assess barriers to discharge/monitor patient progress toward functional and medical goals  Care  Tool:  Bathing    Body parts bathed by patient: Right arm, Left arm, Chest, Abdomen, Front perineal area, Right upper leg, Left upper leg, Face, Buttocks, Right lower leg, Left lower leg   Body parts bathed by helper: Buttocks, Right lower leg, Left lower leg     Bathing assist Assist Level: Supervision/Verbal cueing     Upper Body Dressing/Undressing Upper body dressing   What is the patient wearing?: Hospital gown only    Upper body assist Assist Level: Contact Guard/Touching assist    Lower Body Dressing/Undressing Lower body dressing      What is the patient wearing?: Hospital gown only     Lower body assist Assist for lower body dressing: Moderate Assistance - Patient 50 - 74%     Toileting Toileting    Toileting assist Assist for toileting: Set up assist Assistive Device Comment: Urinal   Transfers Chair/bed transfer  Transfers assist     Chair/bed transfer assist level: Contact Guard/Touching assist     Locomotion Ambulation   Ambulation assist      Assist level: Contact Guard/Touching assist Assistive device: Walker-rolling Max distance: 13ft   Walk 10 feet activity   Assist     Assist level: Contact Guard/Touching assist Assistive device: Walker-rolling   Walk 50 feet activity   Assist Walk 50 feet with 2 turns activity did not occur: Safety/medical concerns         Walk 150 feet activity   Assist Walk 150 feet activity did not occur: Safety/medical concerns  Walk 10 feet on uneven surface  activity   Assist Walk 10 feet on uneven surfaces activity did not occur: Safety/medical concerns         Wheelchair     Assist Will patient use wheelchair at discharge?: No   Wheelchair activity did not occur: N/A         Wheelchair 50 feet with 2 turns activity    Assist    Wheelchair 50 feet with 2 turns activity did not occur: N/A       Wheelchair 150 feet activity     Assist Wheelchair 150  feet activity did not occur: N/A          Medical Problem List and Plan: 1.Functional and mobility deficitssecondary to debility after ARDS, COVID  Continue CIR   OOB today, up to chair (no therapies today) 2. Antithrombotics: -DVT/anticoagulation:Pharmaceutical:Lovenox -antiplatelet therapy: ASA 81 3. Pain Management:tylenol prn  Gabapentin 600 3 times daily  Appears to be stable on 8/8 4. Mood:LCSW to follow for evaluation and support. -antipsychotic agents: N/a 5. Neuropsych: This patientiscapable of making decisions onhisown behalf. 6. Skin/Wound Care:Air mattress overlay. Local care to sacral decub. 7. Fluids/Electrolytes/Nutrition:Monitor I/Os.  8. HTN: Monitor BP   Continue Lasix  Continue metoprolol 25  Controlled on 8/9  Monitor with increased mobility 9. CKD III:   Cr.  1.53 on 8/7, labs ordered for Monday  Cont to monitor 9. T2DM with hyperglycemia: Hgb A1C- 7.3.   SSI  NovoLog 70/30 18 units at bedtime, increased to 23u  NovoLog 70/30 34 units daily--increase to 36u  Control improving 10. CAD: Continue Lasix and lopressor.  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  12. OSA: Question ability to transition to CPAP 13. Thrombocytopenia:   Plts 132 on 8/4, labs ordered for Monday  Cont to monitor 14. Hyponatremia:   Sodium 135 on 8/7  Cont to monitor 15. Cough/Hypoxic respiratory failure/COVID: Encourage IS, FV, OOB  Wean supplemental oxygen as tolerated, continues to require  Donebs changed to QID scheduled 8/9  -added scheduled mucinex-dm today 8/9  - CXR which was read as worsening looks no different from last  -appreciate CCM note. They feel that CXR is stable from 7/31 and recommend observation of pleural effusions given risk/benefit of draining. ?ECHO to  look at [facility]  -repeat cxr tomorrow   -wean oxygen as able  -oxygen goal is >88%  -pt states he feels comfortable, encouraged him to let us know if otherwise  16. Hypoalbuminemia  Supplement initiated on 8/4 17. Leukopenia  WBCs 3.8 on 8/4, labs ordered for Monday  Cont to monitor 18.  Tachycardia-secondary deconditioning  Continue metoprolol 25    19.  Urinary frequency  Continue Ditropan 5       LOS: 6 days A FACE TO FACE EVALUATION WAS PERFORMED  Meredith Staggers 12/07/2018, 8:46 AM

## 2018-12-08 ENCOUNTER — Inpatient Hospital Stay (HOSPITAL_COMMUNITY): Payer: Medicare HMO | Admitting: Occupational Therapy

## 2018-12-08 ENCOUNTER — Inpatient Hospital Stay (HOSPITAL_COMMUNITY): Payer: Medicare HMO

## 2018-12-08 LAB — CBC
HCT: 41 % (ref 39.0–52.0)
Hemoglobin: 13.8 g/dL (ref 13.0–17.0)
MCH: 31.7 pg (ref 26.0–34.0)
MCHC: 33.7 g/dL (ref 30.0–36.0)
MCV: 94.3 fL (ref 80.0–100.0)
Platelets: 184 10*3/uL (ref 150–400)
RBC: 4.35 MIL/uL (ref 4.22–5.81)
RDW: 16 % — ABNORMAL HIGH (ref 11.5–15.5)
WBC: 3.4 10*3/uL — ABNORMAL LOW (ref 4.0–10.5)
nRBC: 0 % (ref 0.0–0.2)

## 2018-12-08 LAB — GLUCOSE, CAPILLARY
Glucose-Capillary: 115 mg/dL — ABNORMAL HIGH (ref 70–99)
Glucose-Capillary: 202 mg/dL — ABNORMAL HIGH (ref 70–99)
Glucose-Capillary: 92 mg/dL (ref 70–99)
Glucose-Capillary: 57 mg/dL — ABNORMAL LOW (ref 70–99)
Glucose-Capillary: 55 mg/dL — ABNORMAL LOW (ref 70–99)
Glucose-Capillary: 87 mg/dL (ref 70–99)

## 2018-12-08 LAB — BASIC METABOLIC PANEL
Anion gap: 9 (ref 5–15)
BUN: 22 mg/dL (ref 8–23)
CO2: 31 mmol/L (ref 22–32)
Calcium: 8.9 mg/dL (ref 8.9–10.3)
Chloride: 98 mmol/L (ref 98–111)
Creatinine, Ser: 1.49 mg/dL — ABNORMAL HIGH (ref 0.61–1.24)
GFR calc Af Amer: 52 mL/min — ABNORMAL LOW (ref >60)
GFR calc non Af Amer: 45 mL/min — ABNORMAL LOW (ref >60)
Glucose, Bld: 269 mg/dL — ABNORMAL HIGH (ref 70–99)
Potassium: 4.6 mmol/L (ref 3.5–5.1)
Sodium: 138 mmol/L (ref 135–145)

## 2018-12-08 MED ORDER — DOCUSATE SODIUM 100 MG PO CAPS: 100.0000 mg | ORAL_CAPSULE | Freq: Two times a day (BID) | ORAL | Status: DC | PRN

## 2018-12-08 NOTE — Progress Notes (Signed)
Occupational Therapy Session Note  Patient Details  Name: Andrew Larsen MRN: [id number] Date of Birth: 12-Jul-1940  Today's Date: 12/08/2018 OT Individual Time: 1405-1500 OT Individual Time Calculation (min): 55 min    Short Term Goals: Week 1:  OT Short Term Goal 1 (Week 1): Pt will complete 1 grooming task in standing to demonstrate increased standing tolerance OT Short Term Goal 2 (Week 1): Pt will complete toilet transfers with LRAD with supervision OT Short Term Goal 3 (Week 1): Pt will complete sit > stand with supervision to complete LB dressing/hygiene  Skilled Therapeutic Interventions/Progress Updates:    Treatment session with focus on activity tolerance/endurance.  Pt received upright in w/c reporting fatigue from sitting up all day, but agreeable to therapy session in room.  Pt completed sit > stand from w/c x5 with focus on decreased reliance on UE and increased standing tolerance.  Pt unable to complete sit > stand without UE support when challenged.  Engaged in mini squats in standing and reaching task to challenge balance and endurance.  O2 sats monitored throughout with sats dropping to 84-86% with each stand, even when increased to 5L.  O2 sats would return to 88-90% after 1 minute with cues for breathing technique.  Stand pivot transfer back to bed with RW with CGA.  Pt doffed shoes and left in sidelying in bed with all needs in reach.  Therapy Documentation Precautions:  Precautions Precautions: Fall Precaution Comments: watch O2 sats, attempt to wean Restrictions Weight Bearing Restrictions: No General:   Vital Signs: Therapy Vitals Temp: (!) 97.4 F (36.3 C) Temp Source: Oral Pulse Rate: 88 Resp: 18 BP: 114/78 Patient Position (if appropriate): Sitting Oxygen Therapy SpO2: 99 % Pain:  Pt with no c/o pain   Therapy/Group: Individual Therapy  Simonne Come 12/08/2018, 3:36 PM

## 2018-12-08 NOTE — Progress Notes (Signed)
[place] PHYSICAL MEDICINE & REHABILITATION PROGRESS NOTE  Subjective/Complaints: Patient reports coughing/breathing issues are worse early in the morning or when lays down in bed at night. Says has a tiny wound on backside, however is almost gone, per pt.  LBM was yesterday but was hard- currently on Senokot S 2 tabs BID, but wants some Colace prn added- also admitted that when coughing, worried about bowel incontinence if stools too loose..   ROS: Patient denies fever, rash, sore throat, blurred vision, nausea, vomiting, diarrhea, cough,  joint or back pain, headache, or mood change.    Objective: Vital Signs: Blood pressure (!) 111/53, pulse 94, temperature 98.1 F (36.7 C), temperature source Oral, resp. rate 20, height 5\' 11"  (1.803 m), weight 113.6 kg, SpO2 92 %. Dg Chest 2 View  Result Date: 12/08/2018 CLINICAL DATA:  Shortness of breath. Cough. COVID-19 viral infection. EXAM: CHEST - 2 VIEW COMPARISON:  12/04/2018 FINDINGS: Heterogeneous bilateral pulmonary airspace disease shows no significant change compared to previous study. Heart size is stable. No evidence of pneumothorax or significant pleural effusion. IMPRESSION: No significant change in heterogeneous bilateral pulmonary airspace disease. Electronically Signed   By: Marlaine Hind M.D.   On: 12/08/2018 08:06   Recent Labs    12/08/18 0923  WBC 3.4*  HGB 13.8  HCT 41.0  PLT 184   Recent Labs    12/08/18 0923  NA 138  K 4.6  CL 98  CO2 31  GLUCOSE 269*  BUN 22  CREATININE 1.49*  CALCIUM 8.9    Physical Exam: BP (!) 111/53 (BP Location: Left Arm)   Pulse 94   Temp 98.1 F (36.7 C) (Oral)   Resp 20   Ht 5\' 11"  (1.803 m)   Wt 113.6 kg   SpO2 92%   BMI 34.93 kg/m  Constitutional: just getting out of shower - shower chair to manual w/c- bariatric, on O2- 4-6L/min but sats were 85-92% after got out of shower even with deep breathing HEENT: on O2 via [place], oropharynx moist membranes, kept coughing deeply- no  production Cardiovascular: borderline tachycardia, no M/R/G; trace LE edema in feet only Respiratory:scattered rhomchi and wheezes- very tight, but has a small-moderate amount of air movement GI: hypoactive BS, soft, NT, protuberant Skin: Warm and dry.  Intact- was unable to assess  backside Psych: Normal mood.  Normal behavior. Musc: Bilateral lower extremity edema trace- unchanged Neurological: Alert Motor: B/l UE 4+/5 proximal to distal, unchanged LLE: 4/5  RLE: 4-/5 HF, KE and 4/5 ADF/PF--stable exam Psych: cooperative.   Assessment/Plan: 1. Functional deficits secondary to debility which require 3+ hours per day of interdisciplinary therapy in a comprehensive inpatient rehab setting.  Physiatrist is providing close team supervision and 24 hour management of active medical problems listed below.  Physiatrist and rehab team continue to assess barriers to discharge/monitor patient progress toward functional and medical goals  Care Tool:  Bathing    Body parts bathed by patient: Right arm, Left arm, Chest, Abdomen, Front perineal area, Right upper leg, Left upper leg, Face, Buttocks, Right lower leg, Left lower leg   Body parts bathed by helper: Buttocks, Right lower leg, Left lower leg     Bathing assist Assist Level: Supervision/Verbal cueing     Upper Body Dressing/Undressing Upper body dressing   What is the patient wearing?: Hospital gown only    Upper body assist Assist Level: Contact Guard/Touching assist    Lower Body Dressing/Undressing Lower body dressing      What is  the patient wearing?: Hospital gown only     Lower body assist Assist for lower body dressing: Moderate Assistance - Patient 50 - 74%     Toileting Toileting    Toileting assist Assist for toileting: Set up assist Assistive Device Comment: Urinal   Transfers Chair/bed transfer  Transfers assist     Chair/bed transfer assist level: Contact Guard/Touching assist      Locomotion Ambulation   Ambulation assist      Assist level: Contact Guard/Touching assist Assistive device: Walker-rolling Max distance: 15'   Walk 10 feet activity   Assist     Assist level: Contact Guard/Touching assist Assistive device: Walker-rolling   Walk 50 feet activity   Assist Walk 50 feet with 2 turns activity did not occur: Safety/medical concerns         Walk 150 feet activity   Assist Walk 150 feet activity did not occur: Safety/medical concerns         Walk 10 feet on uneven surface  activity   Assist Walk 10 feet on uneven surfaces activity did not occur: Safety/medical concerns         Wheelchair     Assist Will patient use wheelchair at discharge?: No   Wheelchair activity did not occur: N/A         Wheelchair 50 feet with 2 turns activity    Assist    Wheelchair 50 feet with 2 turns activity did not occur: N/A       Wheelchair 150 feet activity     Assist Wheelchair 150 feet activity did not occur: N/A          Medical Problem List and Plan: 1.Functional and mobility deficitssecondary to debility after ARDS, COVID  Continue CIR   8/10- working with OT doing shower 2. Antithrombotics: -DVT/anticoagulation:Pharmaceutical:Lovenox -antiplatelet therapy: ASA 81 3. Pain Management:tylenol prn  Gabapentin 600 3 times daily  8/10- denies pain- just coughing issues 4. Mood:LCSW to follow for evaluation and support. -antipsychotic agents: N/a 5. Neuropsych: This patientiscapable of making decisions onhisown behalf. 6. Skin/Wound Care:Air mattress overlay. Local care to sacral decub.  8/10- pt reports "almost healed" 7. Fluids/Electrolytes/Nutrition:Monitor I/Os.  8. HTN: Monitor BP   Continue Lasix  Continue metoprolol 25  Controlled on 8/9  Monitor with increased mobility 9. CKD III:   Cr.  1.53 on 8/7, labs ordered for Monday  Cont to monitor 9. T2DM  with hyperglycemia: Hgb A1C- 7.3.   SSI  NovoLog 70/30 18 units at bedtime, increased to 23u  NovoLog 70/30 34 units daily--increase to 36u  Control improving 10. CAD: Continue Lasix and lopressor.  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  12. OSA: Question ability to transition to CPAP 13. Thrombocytopenia:   Plts 132 on 8/4, labs ordered for Monday  Cont to monitor 14. Hyponatremia:   Sodium 135 on 8/7  Cont to monitor 15. Cough/Hypoxic respiratory failure/COVID: Encourage IS, FV, OOB  Wean supplemental oxygen as tolerated, continues to require  Donebs changed to QID scheduled 8/9  -added scheduled mucinex-dm today 8/9  - CXR which was read as worsening looks no different from last  -appreciate CCM note. They feel that CXR is stable from 7/31 and recommend observation of pleural effusions given risk/benefit of draining. ?ECHO to look at [facility]  -repeat cxr tomorrow   -wean oxygen as able  -  oxygen goal is >88%  -pt states he feels comfortable, encouraged him to let us know if otherwise   8/10- said doesn't feel SOB most of time, in spite of sats 85-92% this AM- on 4-6L/O2- wall O2 since tank won't do >4L/min- multiple nonproductive coughs.  -CXR shows no significant change- will recheck in 1 week. 16. Hypoalbuminemia  Supplement initiated on 8/4 17. Leukopenia  WBCs 3.8 on 8/4, labs ordered for Monday  Cont to monitor  8/10- WBC is 3.4 down from 3.8- overall stable, but will recheck Friday. 18.  Tachycardia-secondary deconditioning  Continue metoprolol 25    19.  Urinary frequency  Continue Ditropan 5       LOS: 7 days A FACE TO FACE EVALUATION WAS PERFORMED  Kymberlee Viger 12/08/2018, 11:29 AM

## 2018-12-08 NOTE — Progress Notes (Signed)
Physical Therapy Session Note  Patient Details  Name: Andrew Larsen MRN: [id number] Date of Birth: [birth year]  Today's Date: 12/08/2018 PT Individual Time: 1115-1200 PT Individual Time Calculation (min): 45 min   Short Term Goals: Week 1:  PT Short Term Goal 1 (Week 1): Pt will ambulate 23' w/ LRAD w/ supevision PT Short Term Goal 2 (Week 1): Pt will initiate stair training PT Short Term Goal 3 (Week 1): Pt will participate in 30 min of upright mobility w/o increase in fatigue PT Short Term Goal 4 (Week 1): Pt will maintain dynamic standing balance w/ supervision  Skilled Therapeutic Interventions/Progress Updates:   Pt resting in bed.  Pt on 5L O2 from wall. O2 sats 100%.  Min assist for rolling R and sitting up.  Squat pivot to w/c with CGA.  Seated Therapeutic exercise performed with LE to increase strength for functional mobility: 10 x 1 each- r/L long arc quad knee extension, R/L heel raises, R/L toe raises, bil glut sets.    Gait training on level tile, RW, x 32' on 4L, x 2.  O2 sats dropped to 86% 1st bout, 90% 2nd bout.  Cues for deep breathing at rest.    W/c propulsion for activity tolerance, x 60'.   At end of session, pt on 3.5L O2 from wall, seated in w/c with seat pad alarm set and needs at hand.     Therapy Documentation Precautions:  Precautions Precautions: Fall Precaution Comments: watch O2 sats, attempt to wean Restrictions Weight Bearing Restrictions: No    Vital Signs: Oxygen Therapy SpO2: 92 % O2 Device: Nasal Cannula O2 Flow Rate (L/min): 5 L/min Pain: denies        Therapy/Group: Individual Therapy  Adri Schloss 12/08/2018, 12:14 PM

## 2018-12-08 NOTE — Progress Notes (Signed)
Occupational Therapy Session Note  Patient Details  Name: Andrew Larsen MRN: [id number] Date of Birth: 05/20/1940  Today's Date: 12/08/2018 OT Individual Time: 1287-8676 OT Individual Time Calculation (min): 42 min    Short Term Goals: Week 1:  OT Short Term Goal 1 (Week 1): Pt will complete 1 grooming task in standing to demonstrate increased standing tolerance OT Short Term Goal 2 (Week 1): Pt will complete toilet transfers with LRAD with supervision OT Short Term Goal 3 (Week 1): Pt will complete sit > stand with supervision to complete LB dressing/hygiene  Skilled Therapeutic Interventions/Progress Updates:    Upon entering the room, pt seated in wheelchair with no c/o pain. Pt is on 6 L O2 via [place] with O2 no lower than 89% this session with therapeutic exercise. Pt able to bend down to tie B shoelaces with increased time. OT provided pt handout and level 1 resistive theraband for B UE strengthening exercises. Pt performed 2 sets of 10 chest pulls, shoulder elevation, shoulder diagonals, bicep curls, and alternating punches with rest breaks as needed. Pt does have bouts of coughing and RN enters to give medication. Pt remained in wheelchair with RN present in room. Call bell and all needed items within reach.   Therapy Documentation Precautions:  Precautions Precautions: Fall Precaution Comments: watch O2 sats, attempt to wean Restrictions Weight Bearing Restrictions: No General:   Vital Signs: Therapy Vitals Patient Position (if appropriate): Sitting Oxygen Therapy SpO2: (!) 86 % O2 Flow Rate (L/min): 4 L/min Patient Activity (if Appropriate): In chair(after ambulating) Pain:   ADL: ADL Grooming: Contact guard Where Assessed-Grooming: Sitting at sink Upper Body Bathing: Supervision/safety, Setup Where Assessed-Upper Body Bathing: Sitting at sink Lower Body Bathing: Moderate assistance Where Assessed-Lower Body Bathing: Sitting at sink, Standing at sink Upper Body  Dressing: Minimal assistance Where Assessed-Upper Body Dressing: Sitting at sink Lower Body Dressing: Moderate assistance Where Assessed-Lower Body Dressing: Sitting at sink, Standing at sink Toileting: Minimal assistance Toilet Transfer: Minimal assistance Toilet Transfer Method: Stand pivot Vision   Perception    Praxis   Exercises:   Other Treatments:     Therapy/Group: Individual Therapy  Gypsy Decant 12/08/2018, 12:57 PM

## 2018-12-08 NOTE — Progress Notes (Signed)
Occupational Therapy Session Note  Patient Details  Name: Andrew Larsen MRN: [id number] Date of Birth: Sep 09, 1940  Today's Date: 12/08/2018 OT Individual Time: 7026-3785 OT Individual Time Calculation (min): 75 min    Short Term Goals: Week 1:  OT Short Term Goal 1 (Week 1): Pt will complete 1 grooming task in standing to demonstrate increased standing tolerance OT Short Term Goal 2 (Week 1): Pt will complete toilet transfers with LRAD with supervision OT Short Term Goal 3 (Week 1): Pt will complete sit > stand with supervision to complete LB dressing/hygiene  Skilled Therapeutic Interventions/Progress Updates:    Treatment session with focus on endurance during self-care retraining. Pt received upright on EOB eating breakfast.  Engaged in discussion regarding current progress towards goals and POC.  Pt agreeable to shower, O2 sats monitored throughout - see below.  Completed stand pivot transfer CGA bed > w/c > tub bench in room shower.  All bathing completed at seated level with lateral leans.  Educated on use of exhaust fan and leaving door cracked when showering at home to increase air flow.  Pt required frequent rest breaks throughout session due to instances of coughing and fatigue.  Completed dressing at sit > stand level with min assist for dynamic standing balance this session. Pt left upright in w/c with all needs in reach.  SpO2: 95% on 3.5L seated EOB 92% on 3.5L after transfer to w/c 90% on 6L in shower Cues for pursed lip breathing to achieve and maintain 88-90% while on 6L in shower 90% on 5L after shower Cues for pursed lip breathing to maintain >88% while on 5L during dressing tasks  Therapy Documentation Precautions:  Precautions Precautions: Fall Precaution Comments: watch O2 sats, attempt to wean Restrictions Weight Bearing Restrictions: No General:   Vital Signs: Therapy Vitals Temp: 98.1 F (36.7 C) Temp Source: Oral Pulse Rate: 94 Resp: 20 BP: (!)  111/53 Patient Position (if appropriate): Lying Oxygen Therapy SpO2: 100 % O2 Device: Room Air Pain:  Pt with no c/o pain   Therapy/Group: Individual Therapy  Simonne Come 12/08/2018, 8:42 AM

## 2018-12-08 NOTE — Progress Notes (Signed)
Patient was transported to radiology. Oxygen tank sent.No acute distress noted, patient was alert & oriented x4. Will pass on to on coming nurse

## 2018-12-09 ENCOUNTER — Inpatient Hospital Stay (HOSPITAL_COMMUNITY): Payer: Medicare HMO | Admitting: Physical Therapy

## 2018-12-09 ENCOUNTER — Inpatient Hospital Stay (HOSPITAL_COMMUNITY): Payer: Medicare HMO | Admitting: Occupational Therapy

## 2018-12-09 LAB — GLUCOSE, CAPILLARY
Glucose-Capillary: 144 mg/dL — ABNORMAL HIGH (ref 70–99)
Glucose-Capillary: 206 mg/dL — ABNORMAL HIGH (ref 70–99)
Glucose-Capillary: 110 mg/dL — ABNORMAL HIGH (ref 70–99)
Glucose-Capillary: 193 mg/dL — ABNORMAL HIGH (ref 70–99)

## 2018-12-09 NOTE — Progress Notes (Signed)
Physical Therapy Session Note  Patient Details  Name: Andrew Larsen MRN: [id number] Date of Birth: 07/26/1940   Pt missed 30 minutes skilled PT due to fatigue.  Pt POC changed to 15/7.   Sheritha Louis 12/09/2018, 3:00 PM

## 2018-12-09 NOTE — Progress Notes (Signed)
[place] PHYSICAL MEDICINE & REHABILITATION PROGRESS NOTE  Subjective/Complaints: Patient reports no coughing episodes/severe SOB as of yet this AM (as soon as I left room, started coughing). Slept well- denies any pain.  LBM 2 days ago, however this is normal for him and feels like will go today. Denies feeling of constipation/fullness.   ROS: Patient denies fever, rash, sore throat, blurred vision, nausea, vomiting, diarrhea, cough,  joint or back pain, headache, or mood change.    Objective: Vital Signs: Blood pressure 99/60, pulse 91, temperature 97.9 F (36.6 C), temperature source Oral, resp. rate 19, height 5\' 11"  (1.803 m), weight 112.8 kg, SpO2 90 %. Dg Chest 2 View  Result Date: 12/08/2018 CLINICAL DATA:  Shortness of breath. Cough. COVID-19 viral infection. EXAM: CHEST - 2 VIEW COMPARISON:  12/04/2018 FINDINGS: Heterogeneous bilateral pulmonary airspace disease shows no significant change compared to previous study. Heart size is stable. No evidence of pneumothorax or significant pleural effusion. IMPRESSION: No significant change in heterogeneous bilateral pulmonary airspace disease. Electronically Signed   By: Marlaine Hind M.D.   On: 12/08/2018 08:06   Recent Labs    12/08/18 0923  WBC 3.4*  HGB 13.8  HCT 41.0  PLT 184   Recent Labs    12/08/18 0923  NA 138  K 4.6  CL 98  CO2 31  GLUCOSE 269*  BUN 22  CREATININE 1.49*  CALCIUM 8.9    Physical Exam: BP 99/60 (BP Location: Left Arm)   Pulse 91   Temp 97.9 F (36.6 C) (Oral)   Resp 19   Ht 5\' 11"  (1.803 m)   Wt 112.8 kg   SpO2 90%   BMI 34.68 kg/m  Constitutional: awake alert, appropriate, sitting on edge of bed, O2 by [place], at 4L, appears comfortable while eating/feeding himself, sats 91% on 4L, NAD HEENT: on O2 via [place], oropharynx moist membranes, no coughing while I was in room today Cardiovascular: borderline tachycardia again; regular rhythm, no M/R/G; trace LE edema in feet  only Respiratory:scattered rhonchi and wheezes heard again, but less so- less tight than yesterday but still not great amount of air movement GI: hypoactive BS, soft, NT, protuberant Skin: Warm and dry.  Intact- was unable to assess  backside Psych: Normal mood.  Normal behavior. Musc: Bilateral lower extremity edema trace- unchanged Neurological: Alert Motor: B/l UE 4+/5 proximal to distal, unchanged LLE: 4/5  RLE: 4-/5 HF, KE and 4/5 ADF/PF--stable exam Psych: cooperative., bright affect  Assessment/Plan: 1. Functional deficits secondary to debility which require 3+ hours per day of interdisciplinary therapy in a comprehensive inpatient rehab setting.  Physiatrist is providing close team supervision and 24 hour management of active medical problems listed below.  Physiatrist and rehab team continue to assess barriers to discharge/monitor patient progress toward functional and medical goals  Care Tool:  Bathing    Body parts bathed by patient: Right arm, Left arm, Chest, Abdomen, Front perineal area, Right upper leg, Left upper leg, Face, Buttocks, Right lower leg, Left lower leg   Body parts bathed by helper: Buttocks, Right lower leg, Left lower leg     Bathing assist Assist Level: Supervision/Verbal cueing     Upper Body Dressing/Undressing Upper body dressing   What is the patient wearing?: Pull over shirt    Upper body assist Assist Level: Set up assist    Lower Body Dressing/Undressing Lower body dressing      What is the patient wearing?: Pants     Lower body assist Assist  for lower body dressing: Minimal Assistance - Patient > 75%     Toileting Toileting    Toileting assist Assist for toileting: Set up assist Assistive Device Comment: Urinal   Transfers Chair/bed transfer  Transfers assist     Chair/bed transfer assist level: Contact Guard/Touching assist     Locomotion Ambulation   Ambulation assist      Assist level: Supervision/Verbal  cueing Assistive device: Walker-rolling Max distance: 32   Walk 10 feet activity   Assist     Assist level: Supervision/Verbal cueing Assistive device: Walker-rolling   Walk 50 feet activity   Assist Walk 50 feet with 2 turns activity did not occur: Safety/medical concerns         Walk 150 feet activity   Assist Walk 150 feet activity did not occur: Safety/medical concerns         Walk 10 feet on uneven surface  activity   Assist Walk 10 feet on uneven surfaces activity did not occur: Safety/medical concerns         Wheelchair     Assist Will patient use wheelchair at discharge?: No Type of Wheelchair: Manual Wheelchair activity did not occur: N/A  Wheelchair assist level: Supervision/Verbal cueing Max wheelchair distance: 60    Wheelchair 50 feet with 2 turns activity    Assist    Wheelchair 50 feet with 2 turns activity did not occur: N/A   Assist Level: Supervision/Verbal cueing   Wheelchair 150 feet activity     Assist Wheelchair 150 feet activity did not occur: N/A       CBG (last 3)  Recent Labs    12/08/18 2220 12/09/18 0636 12/09/18 1126  GLUCAP 87 144* 206*     Medical Problem List and Plan: 1.Functional and mobility deficitssecondary to debility after ARDS, COVID  Continue CIR   8/10- working with OT doing shower  8/11- on 4L, hopefully will be able to leave room on tank since satting 90-91% on 4L. 2. Antithrombotics: -DVT/anticoagulation:Pharmaceutical:Lovenox -antiplatelet therapy: ASA 81 3. Pain Management:tylenol prn  Gabapentin 600 3 times daily  8/10- denies pain- just coughing issues 4. Mood:LCSW to follow for evaluation and support. -antipsychotic agents: N/a 5. Neuropsych: This patientiscapable of making decisions onhisown behalf. 6. Skin/Wound Care:Air mattress overlay. Local care to sacral decub.  8/10- pt reports "almost healed" 7.  Fluids/Electrolytes/Nutrition:Monitor I/Os.  8. HTN: Monitor BP   Continue Lasix  Continue metoprolol 25  Controlled on 8/9  Monitor with increased mobility 9. CKD III:   Cr.  1.53 on 8/7, labs ordered for Monday  8.11- Cr down to 1.49. will con't to monitor and give IVFs if required/dry.  Cont to monitor 9. T2DM with hyperglycemia: Hgb A1C- 7.3.   SSI  NovoLog 70/30 18 units at bedtime, increased to 23u  NovoLog 70/30 34 units daily--increase to 36u  Control improving  8/11- 87- 206- heading downwards- will monitor and make changes tomorrow if required  10. CAD: Continue Lasix and lopressor.  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  12. OSA: Question ability to transition to CPAP 13. Thrombocytopenia:   Plts 132 on 8/4, labs ordered for Monday  Cont to monitor 14. Hyponatremia:   Sodium 135 on 8/7  Cont to monitor  8/11- Na 138- will check later in week  15. Cough/Hypoxic respiratory failure/COVID: Encourage IS, FV, OOB  Wean supplemental oxygen as tolerated, continues to require  Donebs changed to QID scheduled 8/9  -added scheduled mucinex-dm today 8/9  - CXR which was read as worsening looks no different from last  -appreciate CCM note. They feel that CXR is stable from 7/31 and recommend observation of pleural effusions given risk/benefit of draining. ?ECHO to look at [facility]  -repeat cxr tomorrow   -wean oxygen as able  -oxygen goal is >88%  -pt states he feels comfortable, encouraged him to let us know if otherwise   8/10- said doesn't feel SOB most of time, in spite of sats 85-92% this AM- on 4-6L/O2- wall O2 since tank won't do >4L/min- multiple nonproductive coughs.  -CXR shows no significant change- will recheck in 1 week. 16. Hypoalbuminemia  Supplement initiated on 8/4 17. Leukopenia  WBCs 3.8 on 8/4, labs  ordered for Monday  Cont to monitor  8/10- WBC is 3.4 down from 3.8- overall stable, but will recheck Friday. 18.  Tachycardia-secondary deconditioning  Continue metoprolol 25    19.  Urinary frequency  Continue Ditropan 5       LOS: 8 days A FACE TO FACE EVALUATION WAS PERFORMED  Meliza Kage 12/09/2018, 1:05 PM

## 2018-12-09 NOTE — Progress Notes (Signed)
Occupational Therapy Weekly Progress Note  Patient Details  Name: ADMIRAL MARCUCCI MRN: [id number] Date of Birth: March 19, 1941  Beginning of progress report period: December 02, 2018 End of progress report period: December 09, 2018  Today's Date: 12/09/2018 OT Individual Time: 1300-1355 OT Individual Time Calculation (min): 55 min    Patient has met 3 of 3 short term goals.  Pt is making steady progress towards goals.  Pt currently requires close supervision for sit > stand and LB dressing with UE support.  Pt continues to require increased amounts of supplemental O2 (up to 6L) via nasal canula with increased activity and functional tasks due to low tolerance to maintain sats > 88% on 3.5 to 4L.  Pt requires frequent rest breaks due to diminished lung capacity and decreased endurance.  However overall supervision for bathing/dressing at sit > stand level and close supervision during mobility and transfers.    Patient continues to demonstrate the following deficits: muscle weakness, decreased cardiorespiratoy endurance and decreased oxygen support and decreased standing balance and decreased balance strategies and therefore will continue to benefit from skilled OT intervention to enhance overall performance with BADL and Reduce care partner burden.  Patient progressing toward long term goals..  Continue plan of care.  OT Short Term Goals Week 1:  OT Short Term Goal 1 (Week 1): Pt will complete 1 grooming task in standing to demonstrate increased standing tolerance OT Short Term Goal 1 - Progress (Week 1): Met OT Short Term Goal 2 (Week 1): Pt will complete toilet transfers with LRAD with supervision OT Short Term Goal 2 - Progress (Week 1): Met OT Short Term Goal 3 (Week 1): Pt will complete sit > stand with supervision to complete LB dressing/hygiene OT Short Term Goal 3 - Progress (Week 1): Met Week 2:  OT Short Term Goal 1 (Week 2): STG = LTGs due to ELOS  Skilled Therapeutic  Interventions/Progress Updates:    Treatment session with focus on functional tranfers and standing endurance.  Pt received seated on toilet.  Pt completed BM on toilet with setup assist for wash cloths to ensure hygiene.  Completed stand pivot transfers with RW with supervision.  Engaged in bathing at sit > stand level at sink.  Completed oral care in standing with supervision.  O2 sats dropped to 89% on 4L after standing, however returned to low 90s with cues for breathing.  Pt reports fatigue and transferred stand pivot without AD back to bed with close supervision.  Pt left with all needs in reach.  Therapy Documentation Precautions:  Precautions Precautions: Fall Precaution Comments: watch O2 sats, attempt to wean Restrictions Weight Bearing Restrictions: No General: General OT Amount of Missed Time: 20 Minutes Vital Signs:   Pain:   ADL: ADL Grooming: Contact guard Where Assessed-Grooming: Sitting at sink Upper Body Bathing: Supervision/safety, Setup Where Assessed-Upper Body Bathing: Sitting at sink Lower Body Bathing: Moderate assistance Where Assessed-Lower Body Bathing: Sitting at sink, Standing at sink Upper Body Dressing: Minimal assistance Where Assessed-Upper Body Dressing: Sitting at sink Lower Body Dressing: Moderate assistance Where Assessed-Lower Body Dressing: Sitting at sink, Standing at sink Toileting: Minimal assistance Toilet Transfer: Minimal assistance Toilet Transfer Method: Stand pivot Vision   Perception    Praxis   Exercises:   Other Treatments:     Therapy/Group: Individual Therapy  Simonne Come 12/09/2018, 1:09 PM

## 2018-12-09 NOTE — Progress Notes (Signed)
Physical Therapy Session Note  Patient Details  Name: Andrew Larsen MRN: [id number] Date of Birth: 07-11-1940  Today's Date: 12/09/2018 PT Individual Time: 0830-0900 PT Individual Time Calculation (min): 30 min   Short Term Goals: Week 1:  PT Short Term Goal 1 (Week 1): Pt will ambulate 53' w/ LRAD w/ supevision PT Short Term Goal 2 (Week 1): Pt will initiate stair training PT Short Term Goal 3 (Week 1): Pt will participate in 30 min of upright mobility w/o increase in fatigue PT Short Term Goal 4 (Week 1): Pt will maintain dynamic standing balance w/ supervision  Skilled Therapeutic Interventions/Progress Updates:    treatment session focused on activity tolerance through gait training. Pt on 6LO2 for gait. Pt performs sit <> stand with RW from bed and w/c level with supervision. Gait 50' x 2, 91' with RW and supervision.  spO2 85% after gait, improves to 91% with seated rest and cues for pursed lip breathing. Pt left in w/c with alarm set, needs at hand.  Therapy Documentation Precautions:  Precautions Precautions: Fall Precaution Comments: watch O2 sats, attempt to wean Restrictions Weight Bearing Restrictions: No Vital Signs: Oxygen Therapy SpO2: 90 % O2 Device: Nasal Cannula O2 Flow Rate (L/min): 4 L/min Pain:  no c/o pain   Therapy/Group: Individual Therapy  Vallerie Hentz 12/09/2018, 9:02 AM

## 2018-12-09 NOTE — Progress Notes (Signed)
Hypoglycemic Event  CBG: 57  Treatment: juice, peanut butter crackers  Symptoms: none  Follow-up CBG: Time:2210 CBG Result:55  Gave ensure that he refused when the first blood sugar was taken, blood sugar went up to 87  Possible Reasons for Event: unknown  Comments/MD notified: Notified Marlowe [place], PA this morning    Antoine Primas

## 2018-12-09 NOTE — Progress Notes (Signed)
Occupational Therapy Session Note  Patient Details  Name: SAIFAN RAYFORD MRN: [id number] Date of Birth: [birth year]  Today's Date: 12/09/2018 OT Individual Time: 5747-3403 OT Individual Time Calculation (min): 55 min  and Today's Date: 12/09/2018 OT Missed Time: 20 Minutes Missed Time Reason: Patient fatigue   Short Term Goals: Week 1:  OT Short Term Goal 1 (Week 1): Pt will complete 1 grooming task in standing to demonstrate increased standing tolerance OT Short Term Goal 2 (Week 1): Pt will complete toilet transfers with LRAD with supervision OT Short Term Goal 3 (Week 1): Pt will complete sit > stand with supervision to complete LB dressing/hygiene  Skilled Therapeutic Interventions/Progress Updates:    Treatment session with focus on BUE strengthening and endurance and energy conservation education.  Pt received upright in w/c reporting fatigue and wanting to "sit".  Therefore engaged in [place] with theraband 2 sets of 10 chest pulls and diagonals.  Utilized 2# dowel with 2 sets of 10 each bilateral rows, chest presses, and overhead presses.  Pt required rest breaks between each set, coughing occasionally which was relieved with water.  O2 sats remained 92-95% on 4L throughout session.  Engaged in discussion of pt goals and progress towards goals as well as energy conservation strategies.  Pt requesting to terminate session early due to fatigue.  Pt expressing desire to engage in bathing this afternoon.  Pt left seated upright in w/c per pt request, with all needs in reach.  Therapy Documentation Precautions:  Precautions Precautions: Fall Precaution Comments: watch O2 sats, attempt to wean Restrictions Weight Bearing Restrictions: No General: General OT Amount of Missed Time: 20 Minutes Pain:  Pt with no c/o pain   Therapy/Group: Individual Therapy  Simonne Come 12/09/2018, 11:58 AM

## 2018-12-10 ENCOUNTER — Inpatient Hospital Stay (HOSPITAL_COMMUNITY): Payer: Medicare HMO | Admitting: *Deleted

## 2018-12-10 ENCOUNTER — Inpatient Hospital Stay (HOSPITAL_COMMUNITY): Payer: Medicare HMO | Admitting: Occupational Therapy

## 2018-12-10 ENCOUNTER — Inpatient Hospital Stay (HOSPITAL_COMMUNITY): Payer: Medicare HMO | Admitting: Physical Therapy

## 2018-12-10 ENCOUNTER — Encounter (HOSPITAL_COMMUNITY): Payer: Medicare HMO | Admitting: Psychology

## 2018-12-10 DIAGNOSIS — R0602 Shortness of breath: Secondary | ICD-10-CM

## 2018-12-10 LAB — GLUCOSE, CAPILLARY
Glucose-Capillary: 165 mg/dL — ABNORMAL HIGH (ref 70–99)
Glucose-Capillary: 242 mg/dL — ABNORMAL HIGH (ref 70–99)
Glucose-Capillary: 176 mg/dL — ABNORMAL HIGH (ref 70–99)
Glucose-Capillary: 169 mg/dL — ABNORMAL HIGH (ref 70–99)

## 2018-12-10 MED ORDER — IPRATROPIUM-ALBUTEROL 0.5-2.5 (3) MG/3ML IN SOLN
3.0000 mL | Freq: Four times a day (QID) | RESPIRATORY_TRACT | Status: DC
  Administered 2018-12-10 – 2018-12-11 (×3): 3 mL via RESPIRATORY_TRACT
  Filled 2018-12-10 (×3): qty 3

## 2018-12-10 MED ORDER — HYDROCOD POLST-CPM POLST ER 10-8 MG/5ML PO SUER
5.0000 mL | Freq: Two times a day (BID) | ORAL | Status: DC
  Administered 2018-12-10 – 2018-12-16 (×12): 5 mL via ORAL
  Filled 2018-12-10 (×12): qty 5

## 2018-12-10 NOTE — Patient Care Conference (Signed)
Inpatient RehabilitationTeam Conference and Plan of Care Update Date: 12/10/2018   Time: 10:10 AM    Patient Name: Andrew Larsen      Medical Record Number: [id number]  Date of Birth: 1940-06-07 Sex: Male         Room/Bed: 4M08C/4M08C-01 Payor Info: Payor: [employer] MEDICARE / Plan: [place] [employer]MO / Product Type: *No Product type* /    Admitting Diagnosis: 4. Gen Team  Debility, ARDS; 14-16days  Admit Date/Time:  12/01/2018 11:38 AM Admission Comments: No comment available   Primary Diagnosis:  Physical debility Principal Problem: Physical debility  Patient Active Problem List   Diagnosis Date Noted  . COVID-19   . Tachycardia   . Urinary frequency   . Leukopenia   . Hypoalbuminemia due to protein-calorie malnutrition ([place])   . Supplemental oxygen dependent   . Hyponatremia   . Thrombocytopenia ([place])   . Controlled type 2 diabetes mellitus with hyperglycemia ([place])   . CKD (chronic kidney disease), stage III ([place])   . Benign essential HTN   . Physical debility 12/01/2018  . Pressure injury of skin 11/27/2018  . Acute respiratory failure with hypoxia ([place])   . 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 Acute respiratory disease due to COVID-19 virus 11/05/2018  . COVID-19 virus infection 11/03/2018  . CKD (chronic kidney disease) stage 3, GFR 30-59 ml/min ([facility]) 11/03/2018  . Type 2 diabetes mellitus ([place]) 11/03/2018  . Essential hypertension 11/03/2018  . Claudication in peripheral vascular disease ([place]) 06/10/2016  . Morbid obesity ([place]) 09/21/2015  . Upper airway cough syndrome 09/20/2015  . Cough variant asthma 09/20/2015    Expected Discharge Date: Expected Discharge Date: 12/16/18  Team Members Present: Physician leading conference: Dr. Courtney Heys Social Worker Present: Ovidio Kin, LCSW Nurse Present: Judee Clara, LPN PT Present: Georjean Mode, PT OT Present: Willeen Cass, OT;Roanna Epley, COTA SLP Present: Weston Anna, SLP PPS Coordinator present : Ileana Ladd, Burna Mortimer, SLP     Current Status/Progress Goal Weekly Team Focus  Medical   post covid- severe coughing, hypoxia- to sats of low 80s even on 4-6L of O2, debility due to COVID  improve pt's level of endurance, hypoxia- hopefully titrating up on breathing meds will help  increase sats to avg of 85%- today 81-85%-   Bowel/Bladder   continent of bowel & bladder, LBM 12/09/18  remain continent  continue to monitor & assist as needed   Swallow/Nutrition/ Hydration             ADL's   min-supervision bathing and UB dressing, CGA LB dressing and sit > stand; continues to require increased O2 during functional tasks  Mod I toilet transfers and toileting, Supervision/setup bathing and dressing  activity tolerance/endurance, strengthening, ADL retraining   Mobility   supervision transfers and gait up to 50' on 6LO2  mod/i, household gait  endurance, strength, pt/family ed   Communication             Safety/Cognition/ Behavioral Observations            Pain   no c/o pain, has tylenol prn  pain scale <2/10  assess & treat as needed   Skin   has a small stage 2 ulcer to the left inner buttock, no other areas of skin break down  no new areas of skin break down  asses q shift      *See Care Plan and progress notes for long and short-term goals.     Barriers to Discharge  Current Status/Progress Possible Resolutions Date Resolved   Physician    Decreased caregiver support;Medical stability;Weight;New oxygen  O2 sats  close supervision to Min assist with PT and OT  improve O2 sats      Nursing                  PT                    OT                  SLP                SW                Discharge Planning/Teaching Needs:  Daughter will need to come in for educaiton prior to DC tro her home. Pt doing well in his therpaies, seen by neuro-psych and coping appropriately      Team Discussion:  Progressing toward his goals of supervision-mod/i level. Main issue is his  O2 sats and lung function. Still requiring 6 liters when in therapies. Coughing a lot today. Made 15/7 for endurance and fatigue issues. Currently min assist level. MD to consult Internal Med regarding COVID treatments.  Revisions to Treatment Plan:  DC 8/18    Continued Need for Acute Rehabilitation Level of Care: The patient requires daily medical management by a physician with specialized training in physical medicine and rehabilitation for the following conditions: Daily direction of a multidisciplinary physical rehabilitation program to ensure safe treatment while eliciting the highest outcome that is of practical value to the patient.: Yes Daily medical management of patient stability for increased activity during participation in an intensive rehabilitation regime.: Yes Daily analysis of laboratory values and/or radiology reports with any subsequent need for medication adjustment of medical intervention for : Pulmonary problems;Blood pressure problems;Other   I attest that I was present, lead the team conference, and concur with the assessment and plan of the team. Teleconference held due to COVID 19   Roberta Kelly, Gardiner Rhyme 12/10/2018, 2:37 PM

## 2018-12-10 NOTE — Progress Notes (Signed)
Occupational Therapy Session Note  Patient Details  Name: Andrew Larsen MRN: [id number] Date of Birth: 05-Jul-1940  Today's Date: 12/10/2018 OT Individual Time: 1300-1400 OT Individual Time Calculation (min): 60 min    Short Term Goals: Week 2:  OT Short Term Goal 1 (Week 2): STG = LTGs due to ELOS  Skilled Therapeutic Interventions/Progress Updates:    Upon entering the room, pt still finishing lunch. Pt with no c/o pain this session and agreeable to OT intervention. Pt declined toileting but utilizing urinal from seated position without assistance. OT propelled pt via wheelchair to tub room to conserve energy. OT educated and demonstrated use of RW to transfer onto TTB placed in tub shower. Pt returning demonstration with close supervision. Pt returning to wheelchair after rest break secondary to fatigue. OT assisted pt into ADL apartment via wheelchair. Pt reports his bed at home may be a few inches taller than one in apartment and plans to ask daughter to measure it. Pt ambulating with sit steps and use of RW to bed and performed sit >supine with min guard. Pt also transferred into recliner chair that he reports is similar to home. Pt needing min guard and mod cuing for technique secondary to much lower surface. OT assisted pt back to wheelchair and transported to room with respiratory therapist present to give breathing treatment. Pt remained in 6 L of O2 via [place] this session and needing multiple rest breaks with all functional mobility tasks. Pt remained in wheelchair with call bell and all needed items within reach.   Therapy Documentation Precautions:  Precautions Precautions: Fall Precaution Comments: watch O2 sats, attempt to wean Restrictions Weight Bearing Restrictions: No General:   Vital Signs: Therapy Vitals Temp: (!) 97.5 F (36.4 C) Pulse Rate: 95 Resp: 18 BP: 113/63 Patient Position (if appropriate): Sitting Oxygen Therapy SpO2: 95 % O2 Device: Nasal Cannula O2  Flow Rate (L/min): 3 L/min Pain:   ADL: ADL Grooming: Contact guard Where Assessed-Grooming: Sitting at sink Upper Body Bathing: Supervision/safety, Setup Where Assessed-Upper Body Bathing: Sitting at sink Lower Body Bathing: Moderate assistance Where Assessed-Lower Body Bathing: Sitting at sink, Standing at sink Upper Body Dressing: Minimal assistance Where Assessed-Upper Body Dressing: Sitting at sink Lower Body Dressing: Moderate assistance Where Assessed-Lower Body Dressing: Sitting at sink, Standing at sink Toileting: Minimal assistance Toilet Transfer: Minimal assistance Toilet Transfer Method: Stand pivot   Therapy/Group: Individual Therapy  Gypsy Decant 12/10/2018, 4:59 PM

## 2018-12-10 NOTE — Progress Notes (Signed)
Social Work Patient ID: Andrew Larsen, male   DOB: 05/22/1940, 78 y.o.   MRN: [id number]  Met with pt and spoke with daughter-Andrew Larsen via telephone to discuss team conference progress toward supervision-mod/i level and discharge still 8/18. Pt somewhat discouraged by continuing to deal with COVID and the lung issues he has. Aware MD is looking into respiratory treatments to help his lungs. Have scheduled daughter to come in Friday @ 1:00 pm for education. Will continue to work on discharge needs for Tuesday.

## 2018-12-10 NOTE — Progress Notes (Signed)
Occupational Therapy Session Note  Patient Details  Name: Andrew Larsen MRN: [id number] Date of Birth: 1940/08/05  Today's Date: 12/10/2018 OT Individual Time: 6773-7366 OT Individual Time Calculation (min): 57 min    Short Term Goals: Week 2:  OT Short Term Goal 1 (Week 2): STG = LTGs due to ELOS  Skilled Therapeutic Interventions/Progress Updates:    Treatment session with focus on activity tolerance/endurance during self-care tasks.  Pt received seated EOB reporting need to toilet.  Pt ambulated to toilet with RW with supervision.  Pt completed toileting hygiene with supervision with lateral leans.  Ambulated to shower with RW with supervision, min cues for sequencing of transfer on to tub bench.  Supervision overall for bathing with lateral leans for energy conservation.  Min cues for sequencing transfer out of shower.  Engaged in dressing at sit > stand level from w/c with multiple rest breaks due to coughing spells and SOB.  Pt able to complete all aspects of dressing with supervision.  Pt remained upright in w/c with all needs in reach.  Pt maintained >91% on 4L in room, during toilet transfer, and toileting.  Had to be increased to 5L O2 during shower with pt dropping to low 80s with difficulty increasing > 85% with increased time.  91% on 5L during shower.  Returned to 4L after dressing, with pt maintaining ~95% on 4L  Therapy Documentation Precautions:  Precautions Precautions: Fall Precaution Comments: watch O2 sats, attempt to wean Restrictions Weight Bearing Restrictions: No Pain:  Pt with no c/o pain   Therapy/Group: Individual Therapy  Simonne Come 12/10/2018, 9:02 AM

## 2018-12-10 NOTE — Evaluation (Signed)
Recreational Therapy Assessment and Plan  Patient Details  Name: Andrew Larsen MRN: [id number] Date of Birth: 1940/06/07 Today's Date: 12/10/2018  Rehab Potential: Good ELOS:   d/c 8/18  Assessment Problem List:      Patient Active Problem List   Diagnosis Date Noted  . Physical debility 12/01/2018  . Pressure injury of skin 11/27/2018  . Acute respiratory failure with hypoxia ([place])   . 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 Acute respiratory disease due to COVID-19 virus 11/05/2018  . COVID-19 virus infection 11/03/2018  . CKD (chronic kidney disease) stage 3, GFR 30-59 ml/min (H[facility]) 11/03/2018  . Type 2 diabetes mellitus ([place]) 11/03/2018  . Essential hypertension 11/03/2018  . Claudication in peripheral vascular disease ([place]) 06/10/2016  . Morbid obesity ([place]) 09/21/2015  . Upper airway cough syndrome 09/20/2015  . Cough variant asthma 09/20/2015    Past Medical History:      Past Medical History:  Diagnosis Date  . CKD (chronic kidney disease)    CKD stage III (01/2018)  . Daytime somnolence   . Diabetes mellitus without complication ([place])   . Fracture    right fibula/wears boot cast  . GERD (gastroesophageal reflux disease)   . Glaucoma   . Hypercholesteremia   . Hyperlipidemia   . Hypertension   . Mold exposure   . PAD (peripheral artery disease) ([place])   . Sleep apnea    Past Surgical History:       Past Surgical History:  Procedure Laterality Date  . LOWER EXTREMITY ANGIOGRAPHY N/A 06/12/2016   Procedure: Lower Extremity Angiography;  Surgeon: Adrian Prows, MD;  Location: [facility];  Service: Cardiovascular;  Laterality: N/A;  . MUSCLE BIOPSY Right 05/06/2018   Procedure: right vastus lateralis muscle biopsy;  Surgeon: Earnie Larsson, MD;  Location: [facility];  Service: Neurosurgery;  Laterality: Right;  . ORIF ANKLE FRACTURE Right 10/02/2012   Procedure: OPEN REDUCTION INTERNAL FIXATION (ORIF) ANKLE FRACTURE;  Surgeon: Meredith Pel,  MD;  Location: [facility];  Service: Orthopedics;  Laterality: Right;  . PERIPHERAL VASCULAR ATHERECTOMY Left 06/12/2016   Procedure: Peripheral Vascular Atherectomy-Left Popliteal;  Surgeon: Adrian Prows, MD;  Location: [facility];  Service: Cardiovascular;  Laterality: Left;  . PERIPHERAL VASCULAR INTERVENTION Left 06/12/2016   Procedure: Peripheral Vascular Intervention- DCB Left Popliteal;  Surgeon: Adrian Prows, MD;  Location: [facility];  Service: Cardiovascular;  Laterality: Left;    Assessment & Plan Clinical Impression: Patient is a 78 year old male with history of CKD, PAD, HTN, T2DM, OSA , [facility] withdiagnosis of inclusion body myositis; who was admitted on 11/03/18 with nonproductive cough and SOB. He had been exposed to multiple family members with COVID and found out that his outpatient test was positive. He was admitted for observation and CXR revealed bilateral interstitial prominence due to pneumonitis v/s CHF. He continued to decline and was treated with Remdesivir, Actemra, steroids and convalescent plasma. He was monitored in ICU due to high oxygen up to 60% HFNC/ 35 L --PCCM recommended goal at rest to be SAO2 >85%. Fluid overload treated with intermittent IV diuresis. He has improved with supportive care and has been weaned down to 4- 6 L oxygen per [place]. Therapy ongoing and patient noted to be limited by bouts of coughing, DOE/hypoxia, BLE weakness as well as debility. CIR recommended due to functional decline. Patient transferred to CIR on 12/01/2018 .   Pt presents with decreased activity tolerance, decreased oxygen support, decreased functional  mobility Limiting pt's independence with leisure/community pursuits.   Met with pt today to discuss leisure interest, use of leisure time at discharge, activity analysis with potential adaptations & coping strategies.  Pt states he is anxious to return home and engage in activities of choice, especially  those at his church although modified.  Pt feels prepares for upcoming discharge.    Psychosocial / Spiritual Social interaction - Mood/Behavior: Cooperative Strengths/Weaknesses Patient Strengths/Abilities: Willingness to participate Patient weaknesses: Physical limitations TR Patient demonstrates impairments in the following area(s): Endurance;Motor  Plan No further TR as pt is discharging home with family 8/18.  Recommendations for other services: None   Discharge Criteria: Patient will be discharged from TR if patient refuses treatment 3 consecutive times without medical reason.  If treatment goals not met, if there is a change in medical status, if patient makes no progress towards goals or if patient is discharged from hospital.  The above assessment, treatment plan, treatment alternatives and goals were discussed and mutually agreed upon: by patient  Andrew Larsen 12/10/2018, 3:09 PM

## 2018-12-10 NOTE — Progress Notes (Signed)
Physical Therapy Session Note  Patient Details  Name: Andrew Larsen MRN: [id number] Date of Birth: June 24, 1940  Today's Date: 12/10/2018 PT Individual Time: 1100-1146 PT Individual Time Calculation (min): 46 min   Short Term Goals: Week 2:  PT Short Term Goal 1 (Week 2): = LTG  Skilled Therapeutic Interventions/Progress Updates:  Pt performs sit <> stand with supervision after several attempts.  CGA/min A with sit <> stand when fatigued.  Gait with RW x 60' with spO2 86% on 6LO2.  Pt performs nustep x 6 minutes level 4 with 2 x seated rest break.  Stair negotiation 2 stairs x 2 with bilat handrails with min A.  Pt left in room with needs at hand, alarm set.  Therapy Documentation Precautions:  Precautions Precautions: Fall Precaution Comments: watch O2 sats, attempt to wean Restrictions Weight Bearing Restrictions: No Pain:  no c/o pain   Therapy/Group: Individual Therapy  Andrew Larsen 12/10/2018, 11:47 AM

## 2018-12-10 NOTE — Consult Note (Signed)
Neuropsychological Consultation   Patient:   Andrew Larsen   DOB:   [birth year]  MR Number:  [id number]  Location:  [facility] [address] CENTER [address] [place] [id number] [place] [address] Dept: [place]: [phone]           Date of Service:   12/10/2018  Start Time:   2 PM End Time:   3 PM  Provider/Observer:  Ilean Skill, Psy.D.       Clinical Neuropsychologist       Billing Code/Service: [id number], [phone]  Chief Complaint:    Andrew Larsen is a 78 year old male with history of CKD, PAD, HTN, T2DM, OSA.  Patient admitted on 11/03/18 with nonproductive cough and SOB.  He had been exposed to multiple family members with COVID 19 and found to be positive.  Patient was admitted for observation and CXR revealed bilateral interstitial prominence due to pneumonitis v/s CHF.  Pt continued to decline and was treated with Remdesivir, Actemra, steroids and convalescent plasma.  Patient has improved and weaned down to 4-6 L oxygen.  Continues with weakness as well as debility.  Patient recommended for CIR due to functional decline.  The patient is continued to try to cope with extended hospital course and continued significant difficulty maintaining blood oxygen levels/debility   Reason for Service:  The patient was referred for neuropsychological consultation due to coping and adjustment issues.  Below is the HPI for the current admission.  Andrew Larsen is a 78 year old male with history of CKD, PAD, HTN, T2DM, OSA , [facility] at [facility] withdiagnosis of inclusion body myositis; who was admitted on 11/03/18 with nonproductive cough and SOB. He had been exposed to multiple family members with COVID and found out that his outpatient test was positive. He was admitted for observation and CXR revealed bilateral interstitial prominence due to pneumonitis v/s CHF. He continued to decline and was treated with  Remdesivir, Actemra, steroids and convalescent plasma. He was monitored in ICU due to high oxygen up to 60% HFNC/ 35 L --PCCM recommended goal at rest to be SAO2 >85%. Fluid overload treated with intermittent IV diuresis. He has improved with supportive care and has been weaned down to 4- 6 L oxygen per [place]. Therapy ongoing and patient noted to be limited by bouts of coughing, DOE/hypoxia, BLE weakness as well as debility. CIR recommended due to functional decline.  Current Status: The patient reports that he is feeling a bit stronger with physical therapies although he is continued to have difficulties in the morning.  During our discussions today the patient talked about his use of CPAP machine prior to the development of COVID-19 difficulties.  The patient reports that it was very important for him to use his CPAP device at night and whenever he did not use it or what is available would cause him a lot of distress.  The patient felt that it really improved his sleep.  He has not used since in hospital as he has been of oxygen.  Will discuss issues with PA and attending to see if appropriate for his daughter to bring in for patient.   Behavioral Observation: Andrew Larsen  presents as a 77 y.o.-year-old Right African American Male who appeared his stated age. his dress was Appropriate and he was Well Groomed and his manners were Appropriate to the situation.  his participation was indicative of Appropriate and Attentive behaviors.  There were  any physical disabilities noted.  he displayed an appropriate level of cooperation and motivation.     Interactions:    Active Appropriate  Attention:   abnormal and attention span appeared shorter than expected for age  Memory:   within normal limits; recent and remote memory intact  Visuo-spatial:  not examined  Speech (Volume):  low  Speech:   normal; normal  Thought Process:  Coherent and Relevant  Though Content:  WNL; not suicidal and not  homicidal  Orientation:   person, place, time/date and situation  Judgment:   Good  Planning:   Good  Affect:    Appropriate  Mood:    Dysphoric  Insight:   Good  Intelligence:   high  Medical History:   Past Medical History:  Diagnosis Date  . CKD (chronic kidney disease)    CKD stage III (01/2018)  . Daytime somnolence   . Diabetes mellitus without complication ([place])   . Fracture    right fibula/wears boot cast  . GERD (gastroesophageal reflux disease)   . Glaucoma   . Hypercholesteremia   . Hyperlipidemia   . Hypertension   . Mold exposure   . PAD (peripheral artery disease) ([place])   . Sleep apnea    Psychiatric History:  No prior psychiatric history  Family Med/Psych History:  Family History  Problem Relation Age of Onset  . Heart disease Father   . Prostate cancer Brother   . Cancer Brother   . Diabetes Brother   . Colon cancer Neg Hx       Impression/DX:  Andrew Larsen is a 77 year old male with history of CKD, PAD, HTN, T2DM, OSA.  Patient admitted on 11/03/18 with nonproductive cough and SOB.  He had been exposed to multiple family members with COVID 19 and found to be positive.  Patient was admitted for observation and CXR revealed bilateral interstitial prominence due to pneumonitis v/s CHF.  Pt continued to decline and was treated with Remdesivir, Actemra, steroids and convalescent plasma.  Patient has improved and weaned down to 4-6 L oxygen.  Continues with weakness as well as debility.  Patient recommended for CIR due to functional decline.    The patient reports that he is feeling a bit stronger with physical therapies although he is continued to have difficulties in the morning.  During our discussions today the patient talked about his use of CPAP machine prior to the development of COVID-19 difficulties.  The patient reports that it was very important for him to use his CPAP device at night and whenever he did not use it or what is available would  cause him a lot of distress.  The patient felt that it really improved his sleep.  He has not used since in hospital as he has been of oxygen.  Will discuss issues with PA and attending to see if appropriate for his daughter to bring in for patient.   Diagnosis:   Debility        Electronically Signed   _______________________ Ilean Skill, Psy.D.

## 2018-12-10 NOTE — Progress Notes (Signed)
[facility] PHYSICAL MEDICINE & REHABILITATION PROGRESS NOTE  Subjective/Complaints: Patient reports  Coughing so bad, feels like coughing up lung- no better.  sats running 81-86% while in room. LBM yesterday and this AM.  Last breathing treatment last night- not this AM- mainly ordered prn, and BID. Denies constipation.   ROS: Patient denies fever, rash, sore throat, blurred vision, nausea, vomiting, diarrhea, cough,  joint or back pain, headache, or mood change.    Objective: Vital Signs: Blood pressure 110/65, pulse 87, temperature 98.9 F (37.2 C), resp. rate 19, height 5\' 11"  (1.803 m), weight 112.8 kg, SpO2 98 %. No results found. Recent Labs    12/08/18 0923  WBC 3.4*  HGB 13.8  HCT 41.0  PLT 184   Recent Labs    12/08/18 0923  NA 138  K 4.6  CL 98  CO2 31  GLUCOSE 269*  BUN 22  CREATININE 1.49*  CALCIUM 8.9    Physical Exam: BP 110/65 (BP Location: Right Arm)   Pulse 87   Temp 98.9 F (37.2 C)   Resp 19   Ht 5\' 11"  (1.803 m)   Wt 112.8 kg   SpO2 98%   BMI 34.68 kg/m  Constitutional: awake alert, appropriate, coughing horrifically, not productive, on 4-6L O2 today HEENT: on O2 via [place], oropharynx moist membranes,  Cardiovascular: borderline tachycardia again; regular rhythm, no M/R/G; trace LE edema in feet only Respiratory:scattered wheezes, very tight, decreased air movement- sounds tighter than yesterday GI: hypoactive BS, soft, NT, protuberant Skin: Warm and dry.  Intact otherwise,  was unable to assess  backside Psych: Normal mood.  Normal behavior. Musc: Bilateral lower extremity edema trace- unchanged Neurological: Alert Motor: B/l UE 4+/5 proximal to distal, unchanged LLE: 4/5  RLE: 4-/5 HF, KE and 4/5 ADF/PF--stable exam Psych: cooperative., bright affect  Assessment/Plan: 1. Functional deficits secondary to debility which require 3+ hours per day of interdisciplinary therapy in a comprehensive inpatient rehab setting.  Physiatrist  is providing close team supervision and 24 hour management of active medical problems listed below.  Physiatrist and rehab team continue to assess barriers to discharge/monitor patient progress toward functional and medical goals  Care Tool:  Bathing    Body parts bathed by patient: Right arm, Left arm, Chest, Abdomen, Front perineal area, Right upper leg, Left upper leg, Face, Buttocks, Right lower leg, Left lower leg   Body parts bathed by helper: Buttocks, Right lower leg, Left lower leg     Bathing assist Assist Level: Supervision/Verbal cueing     Upper Body Dressing/Undressing Upper body dressing   What is the patient wearing?: Pull over shirt    Upper body assist Assist Level: Set up assist    Lower Body Dressing/Undressing Lower body dressing      What is the patient wearing?: Pants     Lower body assist Assist for lower body dressing: Supervision/Verbal cueing     Toileting Toileting    Toileting assist Assist for toileting: Supervision/Verbal cueing Assistive Device Comment: Urinal   Transfers Chair/bed transfer  Transfers assist     Chair/bed transfer assist level: Supervision/Verbal cueing     Locomotion Ambulation   Ambulation assist      Assist level: Supervision/Verbal cueing Assistive device: Walker-rolling Max distance: 32   Walk 10 feet activity   Assist     Assist level: Supervision/Verbal cueing Assistive device: Walker-rolling   Walk 50 feet activity   Assist Walk 50 feet with 2 turns activity did not occur: Safety/medical concerns  Walk 150 feet activity   Assist Walk 150 feet activity did not occur: Safety/medical concerns         Walk 10 feet on uneven surface  activity   Assist Walk 10 feet on uneven surfaces activity did not occur: Safety/medical concerns         Wheelchair     Assist Will patient use wheelchair at discharge?: No Type of Wheelchair: Manual Wheelchair activity did  not occur: N/A  Wheelchair assist level: Supervision/Verbal cueing Max wheelchair distance: 60    Wheelchair 50 feet with 2 turns activity    Assist    Wheelchair 50 feet with 2 turns activity did not occur: N/A   Assist Level: Supervision/Verbal cueing   Wheelchair 150 feet activity     Assist Wheelchair 150 feet activity did not occur: N/A       CBG (last 3)  Recent Labs    12/09/18 2123 12/10/18 0625 12/10/18 1139  GLUCAP 193* 165* 242*     Medical Problem List and Plan: 1.Functional and mobility deficitssecondary to debility after ARDS, COVID  Continue CIR   8/10- working with OT doing shower  8/11- on 4L, hopefully will be able to leave room on tank since satting 90-91% on 4L.  8/12- requiring 6L today- close supervision for therapy- walked 43ft RW Supervision 2. Antithrombotics: -DVT/anticoagulation:Pharmaceutical:Lovenox -antiplatelet therapy: ASA 81 3. Pain Management:tylenol prn  Gabapentin 600 3 times daily  8/10- denies pain- just coughing issues 4. Mood:LCSW to follow for evaluation and support. -antipsychotic agents: N/a 5. Neuropsych: This patientiscapable of making decisions onhisown behalf. 6. Skin/Wound Care:Air mattress overlay. Local care to sacral decub.  8/10- pt reports "almost healed" 7. Fluids/Electrolytes/Nutrition:Monitor I/Os.  8. HTN: Monitor BP   Continue Lasix  Continue metoprolol 25  Controlled on 8/9  Monitor with increased mobility 9. CKD III:   Cr.  1.53 on 8/7, labs ordered for Monday  8.11- Cr down to 1.49. will con't to monitor and give IVFs if required/dry.  Cont to monitor 9. T2DM with hyperglycemia: Hgb A1C- 7.3.   SSI  NovoLog 70/30 18 units at bedtime, increased to 23u  NovoLog 70/30 34 units daily--increase to 36u  Control improving  8/11- 87- 206- heading downwards- will monitor and make changes tomorrow if required  10. CAD: Continue Lasix and lopressor.   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  12. OSA: Question ability to transition to CPAP 13. Thrombocytopenia:   Plts 132 on 8/4, labs ordered for Monday  Cont to monitor 14. Hyponatremia:   Sodium 135 on 8/7  Cont to monitor  8/11- Na 138- will check later in week  15. Cough/Hypoxic respiratory failure/COVID: Encourage IS, FV, OOB  Wean supplemental oxygen as tolerated, continues to require  Donebs changed to QID scheduled 8/9  -added scheduled mucinex-dm today 8/9  - CXR which was read as worsening looks no different from last  -appreciate CCM note. They feel that CXR is stable from 7/31 and recommend observation of pleural effusions given risk/benefit of draining. ?ECHO to look at [facility]  -repeat cxr tomorrow   -wean oxygen as able  -oxygen goal is >88%  -pt states he feels comfortable, encouraged him to let us know if otherwise   8/10- said doesn't feel SOB most of time, in spite of sats 85-92% this AM- on 4-6L/O2- wall O2  since tank won't do >4L/min- multiple nonproductive coughs.  -CXR shows no significant change- will recheck in 1 week.  8/12- will increase Duonebs to 4x/day, scheduled hydrocodone cough syrup BID 16. Hypoalbuminemia  Supplement initiated on 8/4 17. Leukopenia  WBCs 3.8 on 8/4, labs ordered for Monday  Cont to monitor  8/10- WBC is 3.4 down from 3.8- overall stable, but will recheck Friday. 18.  Tachycardia-secondary deconditioning  Continue metoprolol 25    19.  Urinary frequency  Continue Ditropan 5  20. Dispo- team conference today- d/c date 8/18       LOS: 9 days A FACE TO FACE EVALUATION WAS PERFORMED  Ebbie Sorenson 12/10/2018, 1:02 PM

## 2018-12-10 NOTE — Progress Notes (Signed)
Physical Therapy Weekly Progress Note  Patient Details  Name: Andrew Larsen MRN: [id number] Date of Birth: 1940-06-23  Beginning of progress report period: December 02, 2018 End of progress report period: December 10, 2018    Patient has met 4 of 4 short term goals.  Pt making slow but steady progress with endurance and strengthening. Limited by decreased cardiopulmonary endurance.  Patient continues to demonstrate the following deficits muscle weakness, decreased cardiorespiratoy endurance and decreased oxygen support and decreased standing balance and decreased balance strategies and therefore will continue to benefit from skilled PT intervention to increase functional independence with mobility.  Patient progressing toward long term goals..  Continue plan of care.  PT Short Term Goals Week 1:  PT Short Term Goal 1 (Week 1): Pt will ambulate 20' w/ LRAD w/ supevision PT Short Term Goal 1 - Progress (Week 1): Met PT Short Term Goal 2 (Week 1): Pt will initiate stair training PT Short Term Goal 2 - Progress (Week 1): Met PT Short Term Goal 3 (Week 1): Pt will participate in 30 min of upright mobility w/o increase in fatigue PT Short Term Goal 3 - Progress (Week 1): Met PT Short Term Goal 4 (Week 1): Pt will maintain dynamic standing balance w/ supervision PT Short Term Goal 4 - Progress (Week 1): Met Week 2:  PT Short Term Goal 1 (Week 2): = LTG  Skilled Therapeutic Interventions/Progress Updates:  Ambulation/gait training;Discharge planning;DME/adaptive equipment instruction;Functional mobility training;Pain management;Splinting/orthotics;Psychosocial support;Therapeutic Activities;UE/LE Strength taining/ROM;UE/LE Coordination activities;Therapeutic Exercise;Patient/family education;Skin care/wound management;Stair training;Neuromuscular re-education;Functional electrical stimulation;Disease management/prevention;Community reintegration;Balance/vestibular training;Wheelchair  propulsion/positioning    [name] 12/10/2018, 8:12 AM

## 2018-12-11 ENCOUNTER — Inpatient Hospital Stay (HOSPITAL_COMMUNITY): Payer: Medicare HMO | Admitting: Physical Therapy

## 2018-12-11 ENCOUNTER — Inpatient Hospital Stay (HOSPITAL_COMMUNITY): Payer: Medicare HMO | Admitting: Occupational Therapy

## 2018-12-11 DIAGNOSIS — R7309 Other abnormal glucose: Secondary | ICD-10-CM | POA: Insufficient documentation

## 2018-12-11 LAB — GLUCOSE, CAPILLARY
Glucose-Capillary: 120 mg/dL — ABNORMAL HIGH (ref 70–99)
Glucose-Capillary: 220 mg/dL — ABNORMAL HIGH (ref 70–99)
Glucose-Capillary: 153 mg/dL — ABNORMAL HIGH (ref 70–99)
Glucose-Capillary: 77 mg/dL (ref 70–99)

## 2018-12-11 MED ORDER — IPRATROPIUM-ALBUTEROL 0.5-2.5 (3) MG/3ML IN SOLN
3.0000 mL | Freq: Three times a day (TID) | RESPIRATORY_TRACT | Status: DC
  Administered 2018-12-11 – 2018-12-13 (×6): 3 mL via RESPIRATORY_TRACT
  Filled 2018-12-11 (×5): qty 3

## 2018-12-11 NOTE — Progress Notes (Signed)
Occupational Therapy Session Note  Patient Details  Name: Andrew Larsen MRN: [id number] Date of Birth: 08/10/1940  Today's Date: 12/11/2018 OT Individual Time: 4235-3614 OT Individual Time Calculation (min): 75 min    Short Term Goals: Week 2:  OT Short Term Goal 1 (Week 2): STG = LTGs due to ELOS  Skilled Therapeutic Interventions/Progress Updates:    Treatment session with focus on increased endurance and functional mobility during self-care tasks.  Pt received seated EOB finishing breakfast.  Engaged in discussion regarding yesterday's therapy sessions with functional transfers in home environment.  Pt pleased with ability to complete.  Engaged in dressing only this session, as pt reports he slept well overnight but is still fatigued.  Dressing completed with setup assist at sit > stand level.  Pt ambulated to sink with RW with supervision.  Engaged in oral care in sitting for energy conservation (after walking to sink).  Ambulated 20' with RW with supervision.  Engaged in discussion regarding functional mobility in home and returning to leisure activities as energy allows.  Pt returned to sitting EOB and left with all needs in reach.  Pt O2 sats remained 91% or higher on 3L O2 at sit > stand level, however after ambulation pt required increase to 4L as pt dropped to 82% and required increased time to return to > 88%.    Therapy Documentation Precautions:  Precautions Precautions: Fall Precaution Comments: watch O2 sats, attempt to wean Restrictions Weight Bearing Restrictions: No General:   Vital Signs: Therapy Vitals Pulse Rate: 92 Resp: 18 Patient Position (if appropriate): Lying Oxygen Therapy SpO2: 99 % O2 Device: Nasal Cannula O2 Flow Rate (L/min): 3 L/min Pain:  Pt with no c/o pain   Therapy/Group: Individual Therapy  Simonne Come 12/11/2018, 10:47 AM

## 2018-12-11 NOTE — Progress Notes (Signed)
Called to patients room to set up CPAP.  Patient was asleep.  Patient woke up when I entered room and I discussed placing him on CPAP.  Patient has his mask casing on beside table but not the straps and other mechanics to make it work.  I advised patient I could set him up on our mask for tonight patient did not want to.  RN made aware.  CPAP left at patients beside in case he changes his mind.

## 2018-12-11 NOTE — Progress Notes (Signed)
Physical Therapy Session Note  Patient Details  Name: Andrew Larsen MRN: [id number] Date of Birth: [birth year]  Today's Date: 12/11/2018 PT Individual Time: 1400-1500 PT Individual Time Calculation (min): 60 min   Short Term Goals: Week 2:  PT Short Term Goal 1 (Week 2): = LTG  Skilled Therapeutic Interventions/Progress Updates:   Pt received sitting EOB and agreeable to PT. Pt on 4L/min O2 throughout treatment and SpO2 maintained >95% with all physical activity. Pt performed stand pivot transfer to [facility] with CGA. WC mobility with supervision assist x 69f with min cues for pursed lip breathing and improved use of momentum to reduce overall energy expenditure. Gait training with RW x [id number] [id number] CGA for safety. Pt requires prolonged rest break following each bout of gait training. Dynamic balance and endurance training to perform wii bowling with 1-2 UE support on RW. Pt able to complete 3 and 4 frames standing prior to needing prolonged rest break due to BLE fatigue and reported SOB, but no drop in SpO2. Patient returned to room and left sitting EOB with call bell in reach and all needs met.         Therapy Documentation Precautions:  Precautions Precautions: Fall Precaution Comments: watch O2 sats, attempt to wean Restrictions Weight Bearing Restrictions: No Vital Signs: Therapy Vitals Temp: 97.8 F (36.6 C) Temp Source: Oral Pulse Rate: 100 Resp: 19 BP: 122/77 Patient Position (if appropriate): Sitting Oxygen Therapy SpO2: 97 % O2 Device: Nasal Cannula O2 Flow Rate (L/min): 4 L/min Pain: Pain Assessment Pain Scale: 0-10 Pain Score: 0-No pain    Therapy/Group: Individual Therapy  AusLorie Phenix13/2020, 3:03 PM

## 2018-12-11 NOTE — Progress Notes (Signed)
[facility] PHYSICAL MEDICINE & REHABILITATION PROGRESS NOTE  Subjective/Complaints: Patient seen laying in bed this morning.  He states he slept well overnight.  He notes improvement in cough as well as shortness of breath.  ROS: + Shortness of breath.  Denies CP, nausea, vomiting, diarrhea.   Objective: Vital Signs: Blood pressure 112/67, pulse 92, temperature 97.7 F (36.5 C), temperature source Oral, resp. rate 18, height 5\' 11"  (1.803 m), weight 112.8 kg, SpO2 99 %. No results found. No results for input(s): WBC, HGB, HCT, PLT in the last 72 hours. No results for input(s): NA, K, CL, CO2, GLUCOSE, BUN, CREATININE, CALCIUM in the last 72 hours.  Physical Exam: BP 112/67 (BP Location: Left Arm)   Pulse 92   Temp 97.7 F (36.5 C) (Oral)   Resp 18   Ht 5\' 11"  (1.803 m)   Wt 112.8 kg   SpO2 99%   BMI 34.68 kg/m  Constitutional: No distress . Vital signs reviewed. HENT: Normocephalic.  Atraumatic. Eyes: EOMI. No discharge. Cardiovascular: No JVD. Respiratory: Normal effort.  No stridor. GI: Non-distended. Skin: Warm and dry.  Intact. Psych: Normal mood.  Normal behavior. Musc: Bilateral lower extremity edema trace-improving Neurological: Alert Motor: B/l UE 4+/5 proximal to distal, stable LLE: 4/5, improving RLE: 4-/5 HF, KE and 4/5 ADF/PF (baseline) Psych: Normal mood.  Normal affect.  Assessment/Plan: 1. Functional deficits secondary to debility which require 3+ hours per day of interdisciplinary therapy in a comprehensive inpatient rehab setting.  Physiatrist is providing close team supervision and 24 hour management of active medical problems listed below.  Physiatrist and rehab team continue to assess barriers to discharge/monitor patient progress toward functional and medical goals  Care Tool:  Bathing    Body parts bathed by patient: Right arm, Left arm, Chest, Abdomen, Front perineal area, Right upper leg, Left upper leg, Face, Buttocks, Right lower leg,  Left lower leg   Body parts bathed by helper: Buttocks, Right lower leg, Left lower leg     Bathing assist Assist Level: Supervision/Verbal cueing     Upper Body Dressing/Undressing Upper body dressing   What is the patient wearing?: Pull over shirt    Upper body assist Assist Level: Set up assist    Lower Body Dressing/Undressing Lower body dressing      What is the patient wearing?: Pants     Lower body assist Assist for lower body dressing: Supervision/Verbal cueing     Toileting Toileting    Toileting assist Assist for toileting: Supervision/Verbal cueing Assistive Device Comment: Urinal   Transfers Chair/bed transfer  Transfers assist     Chair/bed transfer assist level: Supervision/Verbal cueing     Locomotion Ambulation   Ambulation assist      Assist level: Supervision/Verbal cueing Assistive device: Walker-rolling Max distance: 30'   Walk 10 feet activity   Assist     Assist level: Supervision/Verbal cueing Assistive device: Walker-rolling   Walk 50 feet activity   Assist Walk 50 feet with 2 turns activity did not occur: Safety/medical concerns         Walk 150 feet activity   Assist Walk 150 feet activity did not occur: Safety/medical concerns         Walk 10 feet on uneven surface  activity   Assist Walk 10 feet on uneven surfaces activity did not occur: Safety/medical concerns         Wheelchair     Assist Will patient use wheelchair at discharge?: No Type of Wheelchair: Educational psychologist  activity did not occur: N/A  Wheelchair assist level: Supervision/Verbal cueing Max wheelchair distance: 60    Wheelchair 50 feet with 2 turns activity    Assist    Wheelchair 50 feet with 2 turns activity did not occur: N/A   Assist Level: Supervision/Verbal cueing   Wheelchair 150 feet activity     Assist Wheelchair 150 feet activity did not occur: N/A       CBG (last 3)  Recent Labs     12/10/18 1702 12/10/18 2050 12/11/18 0620  GLUCAP 176* 169* 120*     Medical Problem List and Plan: 1.Functional and mobility deficitssecondary to debility after ARDS, COVID  Continue CIR 2. Antithrombotics: -DVT/anticoagulation:Pharmaceutical:Lovenox -antiplatelet therapy: ASA 81 3. Pain Management:tylenol prn  Gabapentin 600 3 times daily  Stable on 8/17 4. Mood:LCSW to follow for evaluation and support. -antipsychotic agents: N/a 5. Neuropsych: This patientiscapable of making decisions onhisown behalf. 6. Skin/Wound Care:Air mattress overlay. Local care to sacral decub.  8/10- pt reports "almost healed" 7. Fluids/Electrolytes/Nutrition:Monitor I/Os.  8. HTN: Monitor BP   Continue Lasix  Continue metoprolol 25  Stable on 8/13  Monitor with increased mobility 9. CKD III:   Cr.  1.49 on 8/10  Cont to monitor 9. T2DM with hyperglycemia: Hgb A1C- 7.3.   SSI  NovoLog 70/30 18 units at bedtime, increased to 23u  NovoLog 70/30 34 units daily--increase to 36u  Labile, but?  Improving on 8/13   10. CAD: Continue Lasix and lopressor.  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  12. OSA: Question ability to transition to CPAP 13. Thrombocytopenia: Resolved  Plts 184 on 8/10  Cont to monitor 14. Hyponatremia: Resolved  Cont to monitor  8/11- Na 138- will check later in week 15. Cough/Hypoxic respiratory failure/COVID: Encourage IS, FV, OOB  Wean supplemental oxygen as tolerated, continues to require on 8/13  -added scheduled mucinex-dm on 8/9  - CXR which was read as worsening looks no different from last  -appreciate CCM note. They feel that CXR is stable from 7/31 and recommend observation of pleural effusions given risk/benefit of draining. ?ECHO to look at [facility]  CXR shows no  significant change- will recheck in 1 week.  8/12-increased Duonebs to 4x/day, scheduled hydrocodone cough syrup BID  Improving 16. Hypoalbuminemia  Supplement initiated on 8/4 17. Leukopenia  WBCs 3.4 on 8/10  Cont to monitor 18.  Tachycardia-secondary deconditioning  Continue metoprolol 25  Relatively stable on 8/13 19.  Urinary frequency  Continue Ditropan 5 20. Dispo- team conference today- d/c date 8/18       LOS: 10 days A FACE TO FACE EVALUATION WAS PERFORMED  Drago Hammonds Lorie Phenix 12/11/2018, 10:54 AM

## 2018-12-12 ENCOUNTER — Inpatient Hospital Stay (HOSPITAL_COMMUNITY): Payer: Medicare HMO | Admitting: Occupational Therapy

## 2018-12-12 ENCOUNTER — Encounter (HOSPITAL_COMMUNITY): Payer: Medicare HMO | Admitting: Occupational Therapy

## 2018-12-12 ENCOUNTER — Ambulatory Visit (HOSPITAL_COMMUNITY): Payer: Medicare HMO

## 2018-12-12 LAB — COMPREHENSIVE METABOLIC PANEL
ALT: 24 U/L (ref 0–44)
AST: 25 U/L (ref 15–41)
Albumin: 3 g/dL — ABNORMAL LOW (ref 3.5–5.0)
Alkaline Phosphatase: 52 U/L (ref 38–126)
Anion gap: 10 (ref 5–15)
BUN: 25 mg/dL — ABNORMAL HIGH (ref 8–23)
CO2: 28 mmol/L (ref 22–32)
Calcium: 8.7 mg/dL — ABNORMAL LOW (ref 8.9–10.3)
Chloride: 97 mmol/L — ABNORMAL LOW (ref 98–111)
Creatinine, Ser: 1.36 mg/dL — ABNORMAL HIGH (ref 0.61–1.24)
GFR calc Af Amer: 58 mL/min — ABNORMAL LOW (ref >60)
GFR calc non Af Amer: 50 mL/min — ABNORMAL LOW (ref >60)
Glucose, Bld: 200 mg/dL — ABNORMAL HIGH (ref 70–99)
Potassium: 4.3 mmol/L (ref 3.5–5.1)
Sodium: 135 mmol/L (ref 135–145)
Total Bilirubin: 0.7 mg/dL (ref 0.3–1.2)
Total Protein: 5.1 g/dL — ABNORMAL LOW (ref 6.5–8.1)

## 2018-12-12 LAB — CBC WITH DIFFERENTIAL/PLATELET
Abs Immature Granulocytes: 0.01 10*3/uL (ref 0.00–0.07)
Basophils Absolute: 0 10*3/uL (ref 0.0–0.1)
Basophils Relative: 1 %
Eosinophils Absolute: 0.1 10*3/uL (ref 0.0–0.5)
Eosinophils Relative: 2 %
HCT: 37.6 % — ABNORMAL LOW (ref 39.0–52.0)
Hemoglobin: 12.6 g/dL — ABNORMAL LOW (ref 13.0–17.0)
Immature Granulocytes: 0 %
Lymphocytes Relative: 41 %
Lymphs Abs: 1.7 10*3/uL (ref 0.7–4.0)
MCH: 31.4 pg (ref 26.0–34.0)
MCHC: 33.5 g/dL (ref 30.0–36.0)
MCV: 93.8 fL (ref 80.0–100.0)
Monocytes Absolute: 0.9 10*3/uL (ref 0.1–1.0)
Monocytes Relative: 23 %
Neutro Abs: 1.3 10*3/uL — ABNORMAL LOW (ref 1.7–7.7)
Neutrophils Relative %: 33 %
Platelets: 185 10*3/uL (ref 150–400)
RBC: 4.01 MIL/uL — ABNORMAL LOW (ref 4.22–5.81)
RDW: 15.9 % — ABNORMAL HIGH (ref 11.5–15.5)
WBC: 4 10*3/uL (ref 4.0–10.5)
nRBC: 0 % (ref 0.0–0.2)

## 2018-12-12 LAB — GLUCOSE, CAPILLARY
Glucose-Capillary: 131 mg/dL — ABNORMAL HIGH (ref 70–99)
Glucose-Capillary: 136 mg/dL — ABNORMAL HIGH (ref 70–99)
Glucose-Capillary: 152 mg/dL — ABNORMAL HIGH (ref 70–99)
Glucose-Capillary: 150 mg/dL — ABNORMAL HIGH (ref 70–99)

## 2018-12-12 NOTE — Progress Notes (Signed)
Social Work Patient ID: Andrew Larsen, male   DOB: [birth year], 78 y.o.   MRN: [id number]  Daughter here to go through education with Dad. Both feel he is doing well and are comfortable with his care at home. Discussed equipment and follow up. Will make referrals and work toward discharge 8/18.

## 2018-12-12 NOTE — Progress Notes (Signed)
[facility] PHYSICAL MEDICINE & REHABILITATION PROGRESS NOTE  Subjective/Complaints: No new problems. Cough and breathing gradually getting better.   ROS: Patient denies fever, rash, sore throat, blurred vision, nausea, vomiting, diarrhea,  shortness of breath or chest pain, joint or back pain, headache, or mood change. .   Objective: Vital Signs: Blood pressure 118/67, pulse 88, temperature (!) 97.4 F (36.3 C), temperature source Oral, resp. rate 18, height 5\' 11"  (1.803 m), weight 112.8 kg, SpO2 98 %. No results found. Recent Labs    12/12/18 0850  WBC 4.0  HGB 12.6*  HCT 37.6*  PLT 185   Recent Labs    12/12/18 0850  NA 135  K 4.3  CL 97*  CO2 28  GLUCOSE 200*  BUN 25*  CREATININE 1.36*  CALCIUM 8.7*    Physical Exam: BP 118/67 (BP Location: Left Arm)   Pulse 88   Temp (!) 97.4 F (36.3 C) (Oral)   Resp 18   Ht 5\' 11"  (1.803 m)   Wt 112.8 kg   SpO2 98%   BMI 34.68 kg/m  Constitutional: No distress . Vital signs reviewed. HEENT: EOMI, oral membranes moist Neck: supple Cardiovascular: RRR without murmur. No JVD    Respiratory: CTA Bilaterally with scattered rhonchi, decreased secretions. Normal effort , o2 via [place]  GI: BS +, non-tender, non-distended   Skin: Warm and dry.  Intact. Psych: Normal mood.  Normal behavior. Musc: Bilateral lower extremity edema trace-improved Neurological: Alert Motor: B/l UE 4+/5 proximal to distal, stable LLE: 4/5, improving RLE: 4-/5 HF, KE and 4/5 ADF/PF (baseline) Psych: pleasant.  Assessment/Plan: 1. Functional deficits secondary to debility which require 3+ hours per day of interdisciplinary therapy in a comprehensive inpatient rehab setting.  Physiatrist is providing close team supervision and 24 hour management of active medical problems listed below.  Physiatrist and rehab team continue to assess barriers to discharge/monitor patient progress toward functional and medical goals  Care Tool:  Bathing    Body  parts bathed by patient: Right arm, Left arm, Chest, Abdomen, Front perineal area, Right upper leg, Left upper leg, Face, Buttocks, Right lower leg, Left lower leg   Body parts bathed by helper: Buttocks, Right lower leg, Left lower leg     Bathing assist Assist Level: Supervision/Verbal cueing     Upper Body Dressing/Undressing Upper body dressing   What is the patient wearing?: Pull over shirt    Upper body assist Assist Level: Set up assist    Lower Body Dressing/Undressing Lower body dressing      What is the patient wearing?: Pants     Lower body assist Assist for lower body dressing: Supervision/Verbal cueing     Toileting Toileting    Toileting assist Assist for toileting: Supervision/Verbal cueing Assistive Device Comment: Urinal   Transfers Chair/bed transfer  Transfers assist     Chair/bed transfer assist level: Contact Guard/Touching assist     Locomotion Ambulation   Ambulation assist      Assist level: Contact Guard/Touching assist Assistive device: Walker-rolling Max distance: 60   Walk 10 feet activity   Assist     Assist level: Contact Guard/Touching assist Assistive device: Walker-rolling   Walk 50 feet activity   Assist Walk 50 feet with 2 turns activity did not occur: Safety/medical concerns  Assist level: Contact Guard/Touching assist      Walk 150 feet activity   Assist Walk 150 feet activity did not occur: Safety/medical concerns         Walk  10 feet on uneven surface  activity   Assist Walk 10 feet on uneven surfaces activity did not occur: Safety/medical concerns         Wheelchair     Assist Will patient use wheelchair at discharge?: No Type of Wheelchair: Manual Wheelchair activity did not occur: N/A  Wheelchair assist level: Supervision/Verbal cueing Max wheelchair distance: 95    Wheelchair 50 feet with 2 turns activity    Assist    Wheelchair 50 feet with 2 turns activity did not  occur: N/A   Assist Level: Supervision/Verbal cueing   Wheelchair 150 feet activity     Assist Wheelchair 150 feet activity did not occur: N/A       CBG (last 3)  Recent Labs    12/11/18 2139 12/12/18 0544 12/12/18 1147  GLUCAP 77 131* 136*     Medical Problem List and Plan: 1.Functional and mobility deficitssecondary to debility after ARDS, COVID  Continue CIR 2. Antithrombotics: -DVT/anticoagulation:Pharmaceutical:Lovenox -antiplatelet therapy: ASA 81 3. Pain Management:tylenol prn  Gabapentin 600 3 times daily  Stable on 8/14 4. Mood:LCSW to follow for evaluation and support. -antipsychotic agents: N/a 5. Neuropsych: This patientiscapable of making decisions onhisown behalf. 6. Skin/Wound Care:Air mattress overlay. Local care to sacral decub.  8/10- pt reports "almost healed" 7. Fluids/Electrolytes/Nutrition:Monitor I/Os.  8. HTN: Monitor BP   Continue Lasix  Continue metoprolol 25mg   Stable on 8/14  Monitor with increased mobility 9. CKD III:   Cr.  1.36 8/14  Cont to monitor 9. T2DM with hyperglycemia: Hgb A1C- 7.3.   SSI  NovoLog 70/30 18 units at bedtime, increased to 23u  NovoLog 70/30 34 units daily--increase to 36u  improving   10. CAD: Continue Lasix and lopressor.  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  12. OSA: Question ability to transition to CPAP 13. Thrombocytopenia: Resolved  Plts 184 on 8/10  Cont to monitor 14. Hyponatremia: Resolved  Cont to monitor  8/11- Na 138- will check later in week 15. Cough/Hypoxic respiratory failure/COVID: Encourage IS, FV, OOB  Wean supplemental oxygen as tolerated, continues to require on 8/14   -likely will be a long wean  -added scheduled mucinex-dm on 8/9  - CXRs stable  -CCM recommends observation of  pleural effusions given risk/benefit of draining. ?ECHO to look at [facility]  CXR shows no significant change- will recheck in 1 week.  -wbc's normal  8/12-increased Duonebs to 4x/day, scheduled hydrocodone cough syrup BID  Improving 16. Hypoalbuminemia  Supplement initiated on 8/4 17. Leukopenia  WBCs 4.0 8/14  Cont to monitor 18.  Tachycardia-secondary deconditioning  Continue metoprolol 25  Improving, in 80's 19.  Urinary frequency  Continue Ditropan 5mg  20. Dispo- team conference today- d/c date 8/18       LOS: 11 days A FACE TO [place] 12/12/2018, 11:50 AM

## 2018-12-12 NOTE — Progress Notes (Signed)
Occupational Therapy Session Note  Patient Details  Name: Andrew Larsen MRN: [id number] Date of Birth: [birth year]  Today's Date: 12/12/2018 OT Individual Time: 1005-1050 OT Individual Time Calculation (min): 45 min    Short Term Goals: Week 2:  OT Short Term Goal 1 (Week 2): STG = LTGs due to ELOS  Skilled Therapeutic Interventions/Progress Updates:    Treatment session with focus on endurance during functional mobility and self-care tasks.  Pt received supine in bed asleep, but easily aroused and agreeable to therapy session.  Pt asking questions about new CPAP machine and O2 - notified RN and encouraged pt to ask respiratory therapist when they see him next.  Pt ambulated to sink with RW with supervision.  Engaged in bathing/dressing at overall setup assist, supervision for sit > stand when pulling up pants due to fatigue. Pt initially stating "I don't think I can do this" before standing to complete LB dressing however with encouragement pt completed with supervision.  Pt ambulated back to EOB with RW with supervision and left seated EOB with all needs in reach.  O2 sats 98% on 3L upon arrival.  During ambulation and self-care tasks pt dropped to 82%, requiring increased time and cues for breathing technique.  Increased O2 to 4L with pt still demonstrating intermitted dropping to mid 80s, but able to increase with frequent rest breaks.  Therapy Documentation Precautions:  Precautions Precautions: Fall Precaution Comments: watch O2 sats, attempt to wean Restrictions Weight Bearing Restrictions: No Pain:  Pt with no c/o pain   Therapy/Group: Individual Therapy  Simonne Come 12/12/2018, 12:23 PM

## 2018-12-12 NOTE — Progress Notes (Signed)
Social Work Patient ID: Andrew Larsen, male   DOB: 07-15-1940, 78 y.o.   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  A walker  will not resolve issue with performing activities of daily living.  A wheelchair will allow patient to safely perform daily activities.  Patient is not able to propel themselves in the home using a standard weight wheelchair due to fatigue and endurance .  Patient can self propel in the lightweight wheelchair.

## 2018-12-12 NOTE — Progress Notes (Signed)
Occupational Therapy Session Note  Patient Details  Name: Andrew Larsen MRN: [id number] Date of Birth: Aug 30, 1940  Today's Date: 12/12/2018 OT Individual Time: 1300-1400 OT Individual Time Calculation (min): 60 min    Short Term Goals: Week 2:  OT Short Term Goal 1 (Week 2): STG = LTGs due to ELOS  Skilled Therapeutic Interventions/Progress Updates:    Treatment session with focus on education with pt and pt's daughter, [name].  Educated on ADL retraining and energy conservation with focus on O2 management.  Pt completed stand pivot transfer bed > w/c with supervision.  Engaged in functional mobility and transfers in ADL apt with overall supervision.  Pt completed tub/shower transfer with tub bench, couch, and recliner transfers with supervision.  Discussed modification of routine and home setup to increase success with functional mobility and endurance.  Pt returned to room and left seated upright in w/c with daughter and SWK present.  O2 sats monitored throughout with pt dropping 82-85% on 4L with mobility, but able to return to low 90s in ~30 seconds with rest and cues for breathing.    Therapy Documentation Precautions:  Precautions Precautions: Fall Precaution Comments: watch O2 sats, attempt to wean Restrictions Weight Bearing Restrictions: No General:   Vital Signs: Oxygen Therapy O2 Device: Nasal Cannula O2 Flow Rate (L/min): 4 L/min Pain:  Pt with no c/o pain   Therapy/Group: Individual Therapy  Simonne Come 12/12/2018, 3:28 PM

## 2018-12-12 NOTE — Progress Notes (Signed)
Reesa Chew, PA called regarding family/pt wants education on how to bleed in oxygen into cpap at home.  I discussed that the hospital equipment may be different than home care equipment.  I spoke with the pt and family and discussed this, and I also demonstrated how we bleed in oxygen here and we discussed that the process may be slightly different at home.  The family mentioned that they just want to ensure they have the proper adapters for the oxygen and cpap,  And want to educated at home on the new home equipment, this was relayed to the PA as home care will need to provide education on the equipment supplied at home.

## 2018-12-12 NOTE — Progress Notes (Signed)
Physical Therapy Session Note  Patient Details  Name: Andrew Larsen MRN: [id number] Date of Birth: 1940-08-30  Today's Date: 12/12/2018 PT Individual Time: 1420-1505 PT Individual Time Calculation (min): 45 min   Short Term Goals: Week 1:  PT Short Term Goal 1 (Week 1): Pt will ambulate 21' w/ LRAD w/ supevision PT Short Term Goal 1 - Progress (Week 1): Met PT Short Term Goal 2 (Week 1): Pt will initiate stair training PT Short Term Goal 2 - Progress (Week 1): Met PT Short Term Goal 3 (Week 1): Pt will participate in 30 min of upright mobility w/o increase in fatigue PT Short Term Goal 3 - Progress (Week 1): Met PT Short Term Goal 4 (Week 1): Pt will maintain dynamic standing balance w/ supervision PT Short Term Goal 4 - Progress (Week 1): Met Week 2:  PT Short Term Goal 1 (Week 2): = LTG  Skilled Therapeutic Interventions/Progress Updates:     Patient in w/c with his daughter, [name], and respiratory therapist in room upon PT arrival. Patient alert and agreeable to PT session. On 4L O2 at beginning of session at 93% SPO2. Patient's daughter participated in family education in preparation for discharge next week throughout session. Patient denied pain throughout session.   Therapeutic Activity: Bed Mobility: Patient performed supine to/from sit with supervision with bed flat and without use of rails and elevated off the floor at estimated height of home set up. Provided verbal cues for guarding patient to prevent sliding off the bed in sitting due to high bed height. O2 >93% on 4L throughout mobility HR 103-111.  Transfers: Patient performed sit to/from stand x2, stand pivot x3, and a car transfer with supervision using a RW. Provided verbal cues for hand placement during car transfer. O2 dropped to 80% on 4L x2 required 1 min to recover x1 and 2 min to recover to 88% x1. Titrated O2 to 6L to maintain SPO2 >90% for remainder of session.   Gait Training:  Patient went up/down 3 step using  B rails with CGA. Provided cues for step-to gait pattern for energy conservation/safety and safe guarding techniques for patient's daughter. Educated on having a chair at the top of the steps for energy conservation getting into and out of the house. O2 95% after of 6L. Patient declined performing ambulation this afternoon due to fatigue from back to back sessions and stair training.   Wheelchair Mobility:  Patient propelled wheelchair 25 feet, limited by fatigue at end of session, with supervision and set up assist. Provided verbal cues for turning technique and management of leg rests and breaks throughout session. Patient able to don/doff leg rests with cues.   Patient in w/c with his daughter in the room at end of session with breaks locked and all needs within reach. Educated on use of oxygen and fall risk/prevention at home throughout session. Patient on 4L O2 at 95% SPO2 at end of session.    Therapy Documentation Precautions:  Precautions Precautions: Fall Precaution Comments: watch O2 sats, attempt to wean Restrictions Weight Bearing Restrictions: No    Therapy/Group: Individual Therapy  Kerensa Nicklas L Avagrace Botelho PT, DPT  12/12/2018, 4:11 PM

## 2018-12-13 ENCOUNTER — Inpatient Hospital Stay (HOSPITAL_COMMUNITY): Payer: Medicare HMO | Admitting: Occupational Therapy

## 2018-12-13 ENCOUNTER — Inpatient Hospital Stay (HOSPITAL_COMMUNITY): Payer: Medicare HMO

## 2018-12-13 LAB — GLUCOSE, CAPILLARY
Glucose-Capillary: 98 mg/dL (ref 70–99)
Glucose-Capillary: 189 mg/dL — ABNORMAL HIGH (ref 70–99)
Glucose-Capillary: 138 mg/dL — ABNORMAL HIGH (ref 70–99)
Glucose-Capillary: 171 mg/dL — ABNORMAL HIGH (ref 70–99)

## 2018-12-13 NOTE — Progress Notes (Signed)
Offered to do dressing change on his bottom patient refused claims he will do it when he will be in bed.

## 2018-12-13 NOTE — Progress Notes (Signed)
[place] PHYSICAL MEDICINE & REHABILITATION PROGRESS NOTE  Subjective/Complaints:  Dropped into 80s per PT during ambulation this am , took a couple minutes of rest to climb back, mildly symptomatic at that time   ROS: Patient denies fever, rash, sore throat, blurred vision, nausea, vomiting, diarrhea,  shortness of breath or chest pain, joint or back pain, headache, or mood change. .   Objective: Vital Signs: Blood pressure 115/72, pulse 86, temperature 98.4 F (36.9 C), resp. rate 18, height 5\' 11"  (1.803 m), weight 114.1 kg, SpO2 98 %. No results found. Recent Labs    12/12/18 0850  WBC 4.0  HGB 12.6*  HCT 37.6*  PLT 185   Recent Labs    12/12/18 0850  NA 135  K 4.3  CL 97*  CO2 28  GLUCOSE 200*  BUN 25*  CREATININE 1.36*  CALCIUM 8.7*    Physical Exam: BP 115/72 (BP Location: Left Arm)   Pulse 86   Temp 98.4 F (36.9 C)   Resp 18   Ht 5\' 11"  (1.803 m)   Wt 114.1 kg   SpO2 98%   BMI 35.08 kg/m  Constitutional: No distress . Vital signs reviewed. HEENT: EOMI, oral membranes moist Neck: supple Cardiovascular: RRR without murmur. No JVD    Respiratory: CTA Bilaterally with scattered rhonchi, decreased secretions. Normal effort , o2 via [place]  GI: BS +, non-tender, non-distended   Skin: Warm and dry.  Intact. Psych: Normal mood.  Normal behavior. Musc: Bilateral lower extremity edema trace-improved Neurological: Alert Motor: B/l UE 4+/5 proximal to distal, stable LLE: 4/5, improving RLE: 4-/5 HF, KE and 4/5 ADF/PF (baseline) Psych: pleasant.  Assessment/Plan: 1. Functional deficits secondary to debility which require 3+ hours per day of interdisciplinary therapy in a comprehensive inpatient rehab setting.  Physiatrist is providing close team supervision and 24 hour management of active medical problems listed below.  Physiatrist and rehab team continue to assess barriers to discharge/monitor patient progress toward functional and medical goals  Care  Tool:  Bathing    Body parts bathed by patient: Right arm, Left arm, Chest, Abdomen, Front perineal area, Right upper leg, Left upper leg, Face, Buttocks, Right lower leg, Left lower leg   Body parts bathed by helper: Buttocks, Right lower leg, Left lower leg     Bathing assist Assist Level: Set up assist     Upper Body Dressing/Undressing Upper body dressing   What is the patient wearing?: Pull over shirt    Upper body assist Assist Level: Set up assist    Lower Body Dressing/Undressing Lower body dressing      What is the patient wearing?: Pants     Lower body assist Assist for lower body dressing: Supervision/Verbal cueing     Toileting Toileting    Toileting assist Assist for toileting: Supervision/Verbal cueing Assistive Device Comment: Urinal   Transfers Chair/bed transfer  Transfers assist     Chair/bed transfer assist level: Supervision/Verbal cueing     Locomotion Ambulation   Ambulation assist      Assist level: Contact Guard/Touching assist Assistive device: Walker-rolling Max distance: 60   Walk 10 feet activity   Assist     Assist level: Contact Guard/Touching assist Assistive device: Walker-rolling   Walk 50 feet activity   Assist Walk 50 feet with 2 turns activity did not occur: Safety/medical concerns  Assist level: Contact Guard/Touching assist      Walk 150 feet activity   Assist Walk 150 feet activity did not occur: Safety/medical  concerns         Walk 10 feet on uneven surface  activity   Assist Walk 10 feet on uneven surfaces activity did not occur: Safety/medical concerns         Wheelchair     Assist Will patient use wheelchair at discharge?: Yes Type of Wheelchair: Manual Wheelchair activity did not occur: N/A  Wheelchair assist level: Supervision/Verbal cueing, Set up assist Max wheelchair distance: 72'    Wheelchair 50 feet with 2 turns activity    Assist    Wheelchair 50 feet  with 2 turns activity did not occur: N/A   Assist Level: Supervision/Verbal cueing   Wheelchair 150 feet activity     Assist Wheelchair 150 feet activity did not occur: N/A       CBG (last 3)  Recent Labs    12/12/18 1644 12/12/18 2056 12/13/18 0628  GLUCAP 152* 150* 98     Medical Problem List and Plan: 1.Functional and mobility deficitssecondary to debility after ARDS, COVID  Continue CIR PT, OT, SLP  2. Antithrombotics: -DVT/anticoagulation:Pharmaceutical:Lovenox -antiplatelet therapy: ASA 81 3. Pain Management:tylenol prn  Gabapentin 600 3 times daily  Stable on 8/14 4. Mood:LCSW to follow for evaluation and support. -antipsychotic agents: N/a 5. Neuropsych: This patientiscapable of making decisions onhisown behalf. 6. Skin/Wound Care:Air mattress overlay. Local care to sacral decub.  8/10- pt reports "almost healed" 7. Fluids/Electrolytes/Nutrition:Monitor I/Os.  8. HTN: Monitor BP   Continue Lasix  Continue metoprolol 25mg   Stable on 8/14  Monitor with increased mobility 9. CKD III:   Cr.  1.36 8/14  Cont to monitor 9. T2DM with hyperglycemia: Hgb A1C- 7.3.   SSI  NovoLog 70/30 18 units at bedtime, increased to 23u  NovoLog 70/30 34 units daily--increase to 36u  improving   10. CAD: Continue Lasix and lopressor.  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  12. OSA: Question ability to transition to CPAP 13. Thrombocytopenia: Resolved  Plts 184 on 8/10  Cont to monitor 14. Hyponatremia: Resolved  Cont to monitor  8/11- Na 138- will check later in week 15. Cough/Hypoxic respiratory failure/COVID: Encourage IS, FV, OOB  Wean supplemental oxygen as tolerated, continues to require on 8/14   -likely will be a long wean  -added scheduled mucinex-dm on 8/9  -  CXRs stable  -CCM recommends observation of pleural effusions given risk/benefit of draining. ?ECHO to look at [facility]  CXR shows no significant change- will recheck in 1 week.  -wbc's normal  8/12-increased Duonebs to 4x/day, scheduled hydrocodone cough syrup BID  IS is Broken will get new unit  16. Hypoalbuminemia  Supplement initiated on 8/4 17. Leukopenia  WBCs 4.0 8/14  Cont to monitor 18.  Tachycardia-secondary deconditioning, also with hypoxia   Continue metoprolol 25  Improving, in 80's 19.  Urinary frequency  Continue Ditropan 5mg  20. Dispo- team conference today- d/c date 8/18       LOS: 12 days A FACE TO [place] E Nikayla Madaris 12/13/2018, 9:19 AM

## 2018-12-13 NOTE — Progress Notes (Signed)
Physical Therapy Session Note  Patient Details  Name: Andrew Larsen MRN: [id number] Date of Birth: 02-Nov-1940  Today's Date: 12/13/2018 PT Individual Time: 0800-0915 PT Individual Time Calculation (min): 75 min   Short Term Goals: Week 2:  PT Short Term Goal 1 (Week 2): = LTG  Skilled Therapeutic Interventions/Progress Updates:     Patient sitting EOB eating breakfast upon PT arrival. Patient alert and agreeable to PT session. Denied any pain this morning and reported that his cough seems better today. Vitals: BP 131/81, HR 100, SPO2 98% on 4L O2. Patient sat EOB to finish breakfast while PT discussed POC, d/c planning, and progress with patient. Patient expressed understanding, that he is pleased with his progress, and in agreement with POC. Only concerns at d/c are management of the C-PAP machine at home.   Therapeutic Activity: Transfers: Patient performed sit to/from stand x2 and stand pivot x1 with supervision using a RW.  HR 111, SPO2 88% on 4L O2, increased to >90% in <1 min with pursed lip breathing.  Patient had coughing spell after standing resulting in SPO2 dropping to 87% HR 121. Patient used flutter valve x10 reps with some success with clearing secretions.   Gait Training:  Patient ambulated 77 feet and 50 feet using RW with supervision with on 6L O2. Ambulated with decreased gait speed, decreased step length and height, and increased forward trunk flexion. Provided verbal cues for paced breathing during ambulation and erect posture for increased breath support. HR 120, SPO2 82%, RPE9/10 after first trial, O2 >90 after 3 min with pursed lip breathing and cues for increased activation of the diaphragm. HR 111, SPO2 82%, RPE 6/10 after second trial, O2 >90 after <2 min with pursed lip breathing and cues for increased activation of the diaphragm.   Wheelchair Mobility:  Patient propelled in wheelchair with total A during session for energy conservation and time management.    Therapeutic Exercise: Patient performed the following exercises with verbal and tactile cues for proper technique. -Lifts and chops with diaphragmatic breathing 2x10  Patient in w/c at end of session with breaks locked, chair alarm set, and all needs within reach. SPO2 98% on 4L at end of session.    Therapy Documentation Precautions:  Precautions Precautions: Fall Precaution Comments: watch O2 sats, attempt to wean Restrictions Weight Bearing Restrictions: No  Therapy/Group: Individual Therapy  [name] PT, DPT  12/13/2018, 1:02 PM

## 2018-12-13 NOTE — Progress Notes (Signed)
Occupational Therapy Session Note  Patient Details  Name: Andrew Larsen MRN: [id number] Date of Birth: Nov 15, 1940  Today's Date: 12/13/2018 OT Individual Time: 1530-1605 OT Individual Time Calculation (min): 35 min    Skilled Therapeutic Interventions/Progress Updates: participation this session as follows:  Sit to stand= S in prep for functional mobility and self care tasks  Toilet transfer via walker level level to/fr 3:1= Supervision with 4LO2    Toileting= close S as patient breathed heavily with movements  Dynamic standing tasks for 2.5 minutes per try with cues to breathe in through nose to help decrease rapid breathing   oral care standing at sink for 3 minutes with S for balance.   Patient  Sat at the end of rinsing mouth as he stated he was very fatigued.  O2 at 98 on 4 L   Patient asked this clinician to stay in his room at the end of the session to monitor his O2 and to view to see if it stayed at saturation of at least 90.      This clinician offered to position the Pulse Ox machine in patient's view so he could self monitor as well while this clinciian stayed with him to help calm his concerns.    The 02 stayed at 97 or 98 on 4L.  At the end of the session, patient was reminded to press his call bell if he needed anything or had concerns regarding his oxygen saturation, did not feel well , or however he may want staff to assist.   He call chair alarm was also engaged.  Continue OT Plan of Care      Therapy Documentation Precautions:  Precautions Precautions: Fall Precaution Comments: watch O2 sats, attempt to wean Restrictions Weight Bearing Restrictions: No General:missed 6 min;utes due to fatigue Vital Signs: though patient fatigued quickly, his O2 stayed 94-98 throughout the session and during dynamic standing acfiviies on 4L O2  Pain:denied   Therapy/Group: Individual Therapy  Alfredia Ferguson [facility] 12/13/2018, 5:17 PM

## 2018-12-14 ENCOUNTER — Inpatient Hospital Stay (HOSPITAL_COMMUNITY): Payer: Medicare HMO | Admitting: Occupational Therapy

## 2018-12-14 ENCOUNTER — Inpatient Hospital Stay (HOSPITAL_COMMUNITY): Payer: Medicare HMO

## 2018-12-14 LAB — GLUCOSE, CAPILLARY
Glucose-Capillary: 134 mg/dL — ABNORMAL HIGH (ref 70–99)
Glucose-Capillary: 137 mg/dL — ABNORMAL HIGH (ref 70–99)
Glucose-Capillary: 163 mg/dL — ABNORMAL HIGH (ref 70–99)
Glucose-Capillary: 198 mg/dL — ABNORMAL HIGH (ref 70–99)

## 2018-12-14 NOTE — Progress Notes (Signed)
[place] PHYSICAL MEDICINE & REHABILITATION PROGRESS NOTE  Subjective/Complaints:  Asking about CPAP, no problems this am   ROS: Patient denies , nausea, vomiting, diarrhea,  shortness of breath or chest pain, joint or back pain,  .   Objective: Vital Signs: Blood pressure 112/68, pulse 85, temperature 97.8 F (36.6 C), resp. rate 16, height 5\' 11"  (1.803 m), weight 113 kg, SpO2 97 %. No results found. Recent Labs    12/12/18 0850  WBC 4.0  HGB 12.6*  HCT 37.6*  PLT 185   Recent Labs    12/12/18 0850  NA 135  K 4.3  CL 97*  CO2 28  GLUCOSE 200*  BUN 25*  CREATININE 1.36*  CALCIUM 8.7*    Physical Exam: BP 112/68 (BP Location: Left Arm)   Pulse 85   Temp 97.8 F (36.6 C)   Resp 16   Ht 5\' 11"  (1.803 m)   Wt 113 kg   SpO2 97%   BMI 34.75 kg/m  Constitutional: No distress . Vital signs reviewed. HEENT: EOMI, oral membranes moist Neck: supple Cardiovascular: RRR without murmur. No JVD    Respiratory: CTA Bilaterally with scattered rhonchi, decreased secretions. Normal effort , o2 via [place]  GI: BS +, non-tender, non-distended   Skin: Warm and dry.  Intact. Psych: Normal mood.  Normal behavior. Musc: Bilateral lower extremity edema trace-improved Neurological: Alert Motor: B/l UE 4+/5 proximal to distal, stable LLE: 4/5, improving RLE: 4-/5 HF, KE and 4/5 ADF/PF (baseline) Psych: pleasant.  Assessment/Plan: 1. Functional deficits secondary to debility which require 3+ hours per day of interdisciplinary therapy in a comprehensive inpatient rehab setting.  Physiatrist is providing close team supervision and 24 hour management of active medical problems listed below.  Physiatrist and rehab team continue to assess barriers to discharge/monitor patient progress toward functional and medical goals  Care Tool:  Bathing    Body parts bathed by patient: Right arm, Left arm, Chest, Abdomen, Front perineal area, Right upper leg, Left upper leg, Face, Buttocks,  Right lower leg, Left lower leg   Body parts bathed by helper: Buttocks, Right lower leg, Left lower leg     Bathing assist Assist Level: Set up assist     Upper Body Dressing/Undressing Upper body dressing   What is the patient wearing?: Pull over shirt    Upper body assist Assist Level: Set up assist    Lower Body Dressing/Undressing Lower body dressing      What is the patient wearing?: Pants     Lower body assist Assist for lower body dressing: Supervision/Verbal cueing     Toileting Toileting    Toileting assist Assist for toileting: Supervision/Verbal cueing Assistive Device Comment: Urinal   Transfers Chair/bed transfer  Transfers assist     Chair/bed transfer assist level: Supervision/Verbal cueing     Locomotion Ambulation   Ambulation assist      Assist level: Supervision/Verbal cueing Assistive device: Walker-rolling Max distance: 4'   Walk 10 feet activity   Assist     Assist level: Supervision/Verbal cueing Assistive device: Walker-rolling   Walk 50 feet activity   Assist Walk 50 feet with 2 turns activity did not occur: Safety/medical concerns  Assist level: Supervision/Verbal cueing Assistive device: Walker-rolling    Walk 150 feet activity   Assist Walk 150 feet activity did not occur: Safety/medical concerns         Walk 10 feet on uneven surface  activity   Assist Walk 10 feet on uneven surfaces activity  did not occur: Safety/medical concerns         Wheelchair     Assist Will patient use wheelchair at discharge?: Yes Type of Wheelchair: Manual Wheelchair activity did not occur: N/A  Wheelchair assist level: Supervision/Verbal cueing, Set up assist Max wheelchair distance: 25'    Wheelchair 50 feet with 2 turns activity    Assist    Wheelchair 50 feet with 2 turns activity did not occur: N/A   Assist Level: Supervision/Verbal cueing   Wheelchair 150 feet activity     Assist  Wheelchair 150 feet activity did not occur: N/A       CBG (last 3)  Recent Labs    12/13/18 1638 12/13/18 2114 12/14/18 0632  GLUCAP 138* 171* 134*     Medical Problem List and Plan: 1.Functional and mobility deficitssecondary to debility after ARDS, COVID  Continue CIR PT, OT, SLP  2. Antithrombotics: -DVT/anticoagulation:Pharmaceutical:Lovenox -antiplatelet therapy: ASA 81 3. Pain Management:tylenol prn  Gabapentin 600 3 times daily  Stable on 8/14 4. Mood:LCSW to follow for evaluation and support. -antipsychotic agents: N/a 5. Neuropsych: This patientiscapable of making decisions onhisown behalf. 6. Skin/Wound Care:Air mattress overlay. Local care to sacral decub.  8/10- pt reports "almost healed" 7. Fluids/Electrolytes/Nutrition:Monitor I/Os.  8. HTN: Monitor BP   Continue Lasix  Continue metoprolol 25mg   Stable on 8/14  Monitor with increased mobility 9. CKD III:   Cr.  1.36 8/14  Cont to monitor 9. T2DM with hyperglycemia: Hgb A1C- 7.3.   SSI  NovoLog 70/30 18 units at bedtime, increased to 23u  NovoLog 70/30 34 units daily--increase to 36u   CBG (last 3)  Recent Labs    12/13/18 1638 12/13/18 2114 12/14/18 0632  GLUCAP 138* 171* 134*  controlled   10. CAD: Continue Lasix and lopressor.  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  12. OSA: CPAP- ? Follow up plans for this 13. Thrombocytopenia: Resolved  Plts 184 on 8/10  Cont to monitor 14. Hyponatremia: Resolved  Cont to monitor  8/11- Na 138- will check later in week 15. Cough/Hypoxic respiratory failure/COVID: Encourage IS, FV, OOB  Wean supplemental oxygen as tolerated, continues to require on 8/14   -likely will be a long wean  -added scheduled mucinex-dm on 8/9  - CXRs stable  -CCM recommends  observation of pleural effusions given risk/benefit of draining. ?ECHO to look at [facility]  CXR shows no significant change- will recheck in 1 week.  -wbc's normal  8/12-increased Duonebs to 4x/day, scheduled hydrocodone cough syrup BID  IS is Broken will get new unit  16. Hypoalbuminemia  Supplement initiated on 8/4 17. Leukopenia  WBCs 4.0 8/14  Cont to monitor 18.  Tachycardia-secondary deconditioning, also with hypoxia   Continue metoprolol 25  Improving, in 80's 19.  Urinary frequency  Continue Ditropan 5mg  20. Dispo- team conference today- d/c date 8/18       LOS: 13 days A FACE TO [place] E Andrew Larsen 12/14/2018, 8:55 AM

## 2018-12-14 NOTE — Progress Notes (Signed)
Physical Therapy Session Note  Patient Details  Name: Andrew Larsen MRN: [id number] Date of Birth: 18-Nov-1940  Today's Date: 12/14/2018 PT Individual Time: 1418-1530 PT Individual Time Calculation (min): 72 min   Short Term Goals: Week 2:  PT Short Term Goal 1 (Week 2): = LTG  Skilled Therapeutic Interventions/Progress Updates:    Patient in supine agreeable to PT.  Supine to sit with S.  Donned shoes with mod A for tying.  Patient stand pivot to w/c with RW and S.  Pushed in w/c to gym.  Sit to stand S to RW and ambulated with S on 3L O2 x 100' with RW spO2 78% so O2 increased to 6LPM and increased 93% within 2 minutes.  PAtient ambulated another 100' with RW and O2 at 6L with SpO2 91%.  Patient performed seated LAQ, standing hip flexion, hip extension and hip abduction with 3# weights x 10 each seated rest between each.  Gait with SPC around cones in gym with min A for balance, LE strength.  Patient in dayroom performed standing balance/endurance with 1 hand support on walker for playing Wii bowling with S to minguard A.  Stood for three full frames prior to sitting.  Assisted in w/c to room.  SPT to bed with RW and S.  Sit to supine with S.  Left with call bell in reach and bed alarm activated.    Therapy Documentation Precautions:  Precautions Precautions: Fall Precaution Comments: watch O2 sats, attempt to wean Restrictions Weight Bearing Restrictions: No Pain: Pain Assessment Pain Score: 6  Pain Type: Acute pain Pain Location: Shoulder Pain Orientation: Right;Left Pain Descriptors / Indicators: Sore Pain Intervention(s): Distraction;Repositioned    Therapy/Group: Individual Therapy  Reginia Naas  Magda Kiel, PT 12/14/2018, 4:39 PM

## 2018-12-14 NOTE — Progress Notes (Signed)
Occupational Therapy Session Note  Patient Details  Name: Andrew Larsen MRN: [id number] Date of Birth: November 25, 1940  Today's Date: 12/14/2018 OT Individual Time: 0800-0859 OT Individual Time Calculation (min): 59 min   Skilled Therapeutic Interventions/Progress Updates:    Pt greeted EOB with no c/o pain. Just finished his breakfast and requesting to shower. 02 sats on 3.5L 99% at rest. Ambulatory transfer to TTB completed with supervision using RW. Post transfer sats decreased to 84%. Able to increase to 91-92% after 3.5 minutes of diaphragmatic breathing given instruction. He completed bathing tasks while seated with setup. After drying off, 02 sats 76%. 02 sats were still in the 70s range within 1 minute, so OT bumped him up to 4L. 02 sats increased to 92% shortly afterwards. Lateral scoot<w/c completed with supervision, and then he proceeded with dressing tasks at the sink, sit>stand. Educated pt on forward posture breathing with good carryover during his rest breaks. Supervision for sit<stands. Hair combing completed while standing, and oral care while seated for energy conservation. Stand pivot<bed completed with supervision using RW. 02 sats 88% post transfer, increasing to 92% within a minute. Decreased 02 sats to 3.5L, per setting before tx, and pt was satting at 93% after a few minutes. He wanted to remain EOB for a little while longer. Stated he could return to supine unassisted when ready to lie down. Left him with all needs within reach and bed alarm set.   Therapy Documentation Precautions:  Precautions Precautions: Fall Precaution Comments: watch O2 sats, attempt to wean Restrictions Weight Bearing Restrictions: No ADL: ADL Grooming: Contact guard Where Assessed-Grooming: Sitting at sink Upper Body Bathing: Supervision/safety, Setup Where Assessed-Upper Body Bathing: Sitting at sink Lower Body Bathing: Moderate assistance Where Assessed-Lower Body Bathing: Sitting at sink,  Standing at sink Upper Body Dressing: Minimal assistance Where Assessed-Upper Body Dressing: Sitting at sink Lower Body Dressing: Moderate assistance Where Assessed-Lower Body Dressing: Sitting at sink, Standing at sink Toileting: Minimal assistance Toilet Transfer: Minimal assistance Toilet Transfer Method: Stand pivot     Therapy/Group: Individual Therapy  [name] [name] 12/14/2018, 12:27 PM

## 2018-12-15 ENCOUNTER — Inpatient Hospital Stay (HOSPITAL_COMMUNITY): Payer: Medicare HMO | Admitting: Occupational Therapy

## 2018-12-15 ENCOUNTER — Inpatient Hospital Stay (HOSPITAL_COMMUNITY): Payer: Medicare HMO | Admitting: Physical Therapy

## 2018-12-15 LAB — CBC
HCT: 41.1 % (ref 39.0–52.0)
Hemoglobin: 13.8 g/dL (ref 13.0–17.0)
MCH: 31.6 pg (ref 26.0–34.0)
MCHC: 33.6 g/dL (ref 30.0–36.0)
MCV: 94.1 fL (ref 80.0–100.0)
Platelets: 226 10*3/uL (ref 150–400)
RBC: 4.37 MIL/uL (ref 4.22–5.81)
RDW: 15.8 % — ABNORMAL HIGH (ref 11.5–15.5)
WBC: 4.9 10*3/uL (ref 4.0–10.5)
nRBC: 0 % (ref 0.0–0.2)

## 2018-12-15 LAB — GLUCOSE, CAPILLARY
Glucose-Capillary: 142 mg/dL — ABNORMAL HIGH (ref 70–99)
Glucose-Capillary: 144 mg/dL — ABNORMAL HIGH (ref 70–99)
Glucose-Capillary: 162 mg/dL — ABNORMAL HIGH (ref 70–99)
Glucose-Capillary: 150 mg/dL — ABNORMAL HIGH (ref 70–99)

## 2018-12-15 LAB — BASIC METABOLIC PANEL
Anion gap: 11 (ref 5–15)
BUN: 27 mg/dL — ABNORMAL HIGH (ref 8–23)
CO2: 27 mmol/L (ref 22–32)
Calcium: 9.1 mg/dL (ref 8.9–10.3)
Chloride: 97 mmol/L — ABNORMAL LOW (ref 98–111)
Creatinine, Ser: 1.44 mg/dL — ABNORMAL HIGH (ref 0.61–1.24)
GFR calc Af Amer: 54 mL/min — ABNORMAL LOW (ref >60)
GFR calc non Af Amer: 47 mL/min — ABNORMAL LOW (ref >60)
Glucose, Bld: 247 mg/dL — ABNORMAL HIGH (ref 70–99)
Potassium: 4.1 mmol/L (ref 3.5–5.1)
Sodium: 135 mmol/L (ref 135–145)

## 2018-12-15 MED ORDER — INSULIN ASPART PROT & ASPART (70-30 MIX) 100 UNIT/ML ~~LOC~~ SUSP: 23.0000 [IU] | Freq: Every day | SUBCUTANEOUS | 11 refills | Status: DC

## 2018-12-15 MED ORDER — DM-GUAIFENESIN ER 30-600 MG PO TB12: 1.0000 | ORAL_TABLET | Freq: Two times a day (BID) | ORAL | 0 refills | Status: DC

## 2018-12-15 MED ORDER — FAMOTIDINE 20 MG PO TABS: 20.0000 mg | ORAL_TABLET | Freq: Every day | ORAL | 0 refills | Status: DC

## 2018-12-15 MED ORDER — ZINC SULFATE 220 (50 ZN) MG PO CAPS: 220.0000 mg | ORAL_CAPSULE | Freq: Every day | ORAL | 0 refills | Status: DC

## 2018-12-15 MED ORDER — SENNOSIDES-DOCUSATE SODIUM 8.6-50 MG PO TABS: 2.0000 | ORAL_TABLET | Freq: Two times a day (BID) | ORAL | Status: DC

## 2018-12-15 MED ORDER — FOLIC ACID 1 MG PO TABS: 1.0000 mg | ORAL_TABLET | Freq: Every day | ORAL | 0 refills | Status: DC

## 2018-12-15 MED ORDER — ASCORBIC ACID 500 MG PO TABS: 500.0000 mg | ORAL_TABLET | Freq: Every day | ORAL | 0 refills | Status: DC

## 2018-12-15 MED ORDER — PANTOPRAZOLE SODIUM 40 MG PO TBEC: 40.0000 mg | DELAYED_RELEASE_TABLET | Freq: Every day | ORAL | 0 refills | Status: DC

## 2018-12-15 MED ORDER — POTASSIUM CHLORIDE CRYS ER 20 MEQ PO TBCR: 20.0000 meq | EXTENDED_RELEASE_TABLET | Freq: Every day | ORAL | 0 refills | Status: DC

## 2018-12-15 MED ORDER — CILOSTAZOL 100 MG PO TABS: 100.0000 mg | ORAL_TABLET | Freq: Two times a day (BID) | ORAL | 0 refills | Status: DC

## 2018-12-15 MED ORDER — DOCUSATE SODIUM 100 MG PO CAPS: 100.0000 mg | ORAL_CAPSULE | Freq: Two times a day (BID) | ORAL | 0 refills | Status: DC | PRN

## 2018-12-15 MED ORDER — BENZONATATE 200 MG PO CAPS: 200.0000 mg | ORAL_CAPSULE | Freq: Three times a day (TID) | ORAL | 0 refills | Status: DC | PRN

## 2018-12-15 MED ORDER — NOVOLOG MIX 70/30 FLEXPEN (70-30) 100 UNIT/ML ~~LOC~~ SUPN: 36.0000 [IU] | PEN_INJECTOR | Freq: Every day | SUBCUTANEOUS | 11 refills | Status: DC

## 2018-12-15 MED ORDER — CALCIUM CARBONATE 600 MG PO TABS: 1200.0000 mg | ORAL_TABLET | Freq: Every day | ORAL | 0 refills | Status: DC

## 2018-12-15 MED ORDER — METOPROLOL TARTRATE 25 MG PO TABS: 25.0000 mg | ORAL_TABLET | Freq: Two times a day (BID) | ORAL | 0 refills | Status: AC

## 2018-12-15 MED ORDER — GABAPENTIN 300 MG PO CAPS: 600.0000 mg | ORAL_CAPSULE | Freq: Three times a day (TID) | ORAL | 0 refills | Status: AC

## 2018-12-15 MED ORDER — FUROSEMIDE 40 MG PO TABS: 40.0000 mg | ORAL_TABLET | Freq: Every day | ORAL | 0 refills | Status: AC

## 2018-12-15 MED ORDER — OXYBUTYNIN CHLORIDE 5 MG PO TABS: 5.0000 mg | ORAL_TABLET | Freq: Two times a day (BID) | ORAL | 0 refills | Status: DC

## 2018-12-15 MED ORDER — GUAIFENESIN-DM 100-10 MG/5ML PO SYRP: 5.0000 mL | ORAL_SOLUTION | Freq: Four times a day (QID) | ORAL | 0 refills | Status: DC | PRN

## 2018-12-15 NOTE — Progress Notes (Signed)
Occupational Therapy Discharge Summary  Patient Details  Name: Andrew Larsen MRN: [id number] Date of Birth: 06/06/1940  Patient has met 9 of 9 long term goals due to improved activity tolerance, ability to compensate for deficits and improved awareness.  Patient to discharge at overall Mod I toilet transfers and toileting, Supervision/setup bathing and dressing level.  Patient's care partner is independent to provide the necessary intermittent assistance at discharge.    Reasons goals not met: N/A  Recommendation:  Patient will benefit from ongoing skilled OT services in home health setting to continue to advance functional skills in the area of BADL and Reduce care partner burden.  Equipment: tub bench  Reasons for discharge: treatment goals met and discharge from hospital  Patient/family agrees with progress made and goals achieved: Yes  OT Discharge Precautions/Restrictions  Precautions Precautions: Fall Precaution Comments: watch O2 sats Pain Pain Assessment Pain Scale: 0-10 Pain Score: 0-No pain ADL ADL Eating: Independent Grooming: Modified independent Where Assessed-Grooming: Sitting at sink Upper Body Bathing: Setup, Supervision/safety Where Assessed-Upper Body Bathing: Shower Lower Body Bathing: Supervision/safety, Setup Where Assessed-Lower Body Bathing: Shower Upper Body Dressing: Setup Where Assessed-Upper Body Dressing: Edge of bed Lower Body Dressing: Setup Where Assessed-Lower Body Dressing: Edge of bed Toileting: Modified independent Where Assessed-Toileting: Glass blower/designer: Diplomatic Services operational officer Method: Ambulating Tub/Shower Transfer: Distant supervision Tub/Shower Transfer Method: Optometrist: Radio broadcast assistant Vision Baseline Vision/History: Wears glasses Wears Glasses: At all times Patient Visual Report: No change from baseline Vision Assessment?: No apparent visual deficits Perception  Perception:  Within Functional Limits Praxis Praxis: Intact Cognition Overall Cognitive Status: Within Functional Limits for tasks assessed Arousal/Alertness: Awake/alert Orientation Level: Oriented X4 Attention: Selective;Alternating Selective Attention: Appears intact Alternating Attention: Appears intact Memory: Appears intact Awareness: Appears intact Problem Solving: Appears intact Safety/Judgment: Appears intact Sensation Sensation Light Touch: Impaired by gross assessment(reports diminished sensation in BLE, has been going on for years) Proprioception: Appears Intact Coordination Fine Motor Movements are Fluid and Coordinated: Yes Finger Nose Finger Test: WNL Extremity/Trunk Assessment RUE Assessment RUE Assessment: Within Functional Limits General Strength Comments: grossly 4+/5 LUE Assessment LUE Assessment: Within Functional Limits General Strength Comments: grossly 4+/5   Maylyn Narvaiz, [facility] 12/15/2018, 12:33 PM

## 2018-12-15 NOTE — Progress Notes (Signed)
Social Work Patient ID: Andrew Larsen, male   DOB: 1940-12-17, 78 y.o.   MRN: [id number]  Spoke with [employer] where pt has gotten his CPAP machine and also [employer] home O2 supplier and [employer] will have the attachment for daughter to pick up today for his CPAP machine to bleed through O2. Daughter to let me know if any concerns when picks up attachment.

## 2018-12-15 NOTE — Progress Notes (Signed)
[facility] PHYSICAL MEDICINE & REHABILITATION PROGRESS NOTE  Subjective/Complaints:  Asking about CPAP, concerned that is going home tomorrow, and resp therapy wasn't helpful on figuring out if CPAP will work with O2- I will check with team and see - appears that certain CPAP machines don't work with O2 (he didn't have O2 at home) , but needs now- will ask SW to help get new CPAP.  Less SOB/less coughing.   ROS: Patient denies , nausea, vomiting, diarrhea,  shortness of breath or chest pain, joint or back pain,  .   Objective: Vital Signs: Blood pressure 117/69, pulse (!) 102, temperature 98.7 F (37.1 C), temperature source Oral, resp. rate 20, height 5\' 11"  (1.803 m), weight 112.4 kg, SpO2 96 %. No results found. Recent Labs    12/15/18 0816  WBC 4.9  HGB 13.8  HCT 41.1  PLT 226   Recent Labs    12/15/18 0816  NA 135  K 4.1  CL 97*  CO2 27  GLUCOSE 247*  BUN 27*  CREATININE 1.44*  CALCIUM 9.1    Physical Exam: BP 117/69 (BP Location: Left Arm)   Pulse (!) 102   Temp 98.7 F (37.1 C) (Oral)   Resp 20   Ht 5\' 11"  (1.803 m)   Wt 112.4 kg   SpO2 96%   BMI 34.56 kg/m  Constitutional: No distress . Vital signs reviewed along with labs- pt sitting up in manual w/c in room, on O2 by [place]- appears to be 4L- sats 90-95% HEENT: EOMI, oral membranes moist Neck: supple Cardiovascular: RRR without murmur.    Respiratory: CTA Bilaterally a little tight, but NO wheezes, rales or rhonchi today- much improved- no coughing fits GI: BS +, non-tender, non-distended   Skin: Warm and dry.  Intact. Psych: Normal mood.  Normal behavior. Musc: Bilateral lower extremity edema trace-improved Neurological: Alert Motor: B/l UE 4+/5 proximal to distal, stable LLE: 4/5, improving RLE: 4-/5 HF, KE and 4/5 ADF/PF (baseline) Psych: pleasant.  Assessment/Plan: 1. Functional deficits secondary to debility which require 3+ hours per day of interdisciplinary therapy in a comprehensive  inpatient rehab setting.  Physiatrist is providing close team supervision and 24 hour management of active medical problems listed below.  Physiatrist and rehab team continue to assess barriers to discharge/monitor patient progress toward functional and medical goals  Care Tool:  Bathing    Body parts bathed by patient: Right arm, Left arm, Chest, Abdomen, Front perineal area, Right upper leg, Left upper leg, Face, Buttocks, Right lower leg, Left lower leg   Body parts bathed by helper: Buttocks, Right lower leg, Left lower leg     Bathing assist Assist Level: Set up assist     Upper Body Dressing/Undressing Upper body dressing   What is the patient wearing?: Pull over shirt    Upper body assist Assist Level: Set up assist    Lower Body Dressing/Undressing Lower body dressing      What is the patient wearing?: Pants     Lower body assist Assist for lower body dressing: Supervision/Verbal cueing     Toileting Toileting    Toileting assist Assist for toileting: Supervision/Verbal cueing Assistive Device Comment: Urinal   Transfers Chair/bed transfer  Transfers assist     Chair/bed transfer assist level: Supervision/Verbal cueing     Locomotion Ambulation   Ambulation assist      Assist level: Supervision/Verbal cueing Assistive device: Walker-rolling Max distance: 100'   Walk 10 feet activity   Assist  Assist level: Supervision/Verbal cueing Assistive device: Walker-rolling   Walk 50 feet activity   Assist Walk 50 feet with 2 turns activity did not occur: Safety/medical concerns  Assist level: Supervision/Verbal cueing Assistive device: Walker-rolling    Walk 150 feet activity   Assist Walk 150 feet activity did not occur: Safety/medical concerns         Walk 10 feet on uneven surface  activity   Assist Walk 10 feet on uneven surfaces activity did not occur: Safety/medical concerns         Wheelchair     Assist  Will patient use wheelchair at discharge?: Yes Type of Wheelchair: Manual Wheelchair activity did not occur: N/A  Wheelchair assist level: Supervision/Verbal cueing, Set up assist Max wheelchair distance: 25'    Wheelchair 50 feet with 2 turns activity    Assist    Wheelchair 50 feet with 2 turns activity did not occur: N/A   Assist Level: Supervision/Verbal cueing   Wheelchair 150 feet activity     Assist Wheelchair 150 feet activity did not occur: N/A       CBG (last 3)  Recent Labs    12/14/18 1647 12/14/18 2107 12/15/18 0618  GLUCAP 163* 198* 142*     Medical Problem List and Plan: 1.Functional and mobility deficitssecondary to debility after ARDS, COVID  Continue CIR PT, OT, SLP  2. Antithrombotics: -DVT/anticoagulation:Pharmaceutical:Lovenox -antiplatelet therapy: ASA 81 3. Pain Management:tylenol prn  Gabapentin 600 3 times daily  Stable on 8/14 4. Mood:LCSW to follow for evaluation and support. -antipsychotic agents: N/a 5. Neuropsych: This patientiscapable of making decisions onhisown behalf. 6. Skin/Wound Care:Air mattress overlay. Local care to sacral decub.  8/10- pt reports "almost healed" 7. Fluids/Electrolytes/Nutrition:Monitor I/Os.  8. HTN: Monitor BP   Continue Lasix  Continue metoprolol 25mg   Stable on 8/14  Monitor with increased mobility 9. CKD III:   Cr.  1.36 8/14  Cont to monitor 9. T2DM with hyperglycemia: Hgb A1C- 7.3.   SSI  NovoLog 70/30 18 units at bedtime, increased to 23u  NovoLog 70/30 34 units daily--increase to 36u   CBG (last 3)  Recent Labs    12/14/18 1647 12/14/18 2107 12/15/18 0618  GLUCAP 163* 198* 142*  controlled   10. CAD: Continue Lasix and lopressor.  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  12. OSA:  CPAP- ? Follow up plans for this  8/17- will ask SW to reorder a CPAP that will attach to O2.  13. Thrombocytopenia: Resolved  Plts 184 on 8/10  Cont to monitor 14. Hyponatremia: Resolved  Cont to monitor  8/11- Na 138- will check later in week 15. Cough/Hypoxic respiratory failure/COVID: Encourage IS, FV, OOB  Wean supplemental oxygen as tolerated, continues to require on 8/14   -likely will be a long wean  -added scheduled mucinex-dm on 8/9  - CXRs stable  -CCM recommends observation of pleural effusions given risk/benefit of draining. ?ECHO to look at [facility]  CXR shows no significant change- will recheck in 1 week.  -wbc's normal  8/12-increased Duonebs to 4x/day, scheduled hydrocodone cough syrup BID  IS is Broken will get new unit  16. Hypoalbuminemia  Supplement initiated on 8/4 17. Leukopenia  WBCs 4.0 8/14  Cont to monitor 18.  Tachycardia-secondary deconditioning, also with hypoxia   Continue metoprolol 25  Improving, in 80's 19.  Urinary frequency  Continue Ditropan 5mg  20. Dispo- team conference today- d/c date 8/18       LOS: 14 days A FACE TO FACE EVALUATION WAS PERFORMED  Chantrice Hagg 12/15/2018, 10:34 AM

## 2018-12-15 NOTE — Progress Notes (Signed)
Patient refused CPAP tonight 

## 2018-12-15 NOTE — Progress Notes (Signed)
Occupational Therapy Session Note  Patient Details  Name: Andrew Larsen MRN: [id number] Date of Birth: Aug 12, 1940  Today's Date: 12/15/2018 OT Individual Time: 0156-1537 OT Individual Time Calculation (min): 60 min    Short Term Goals: Week 2:  OT Short Term Goal 1 (Week 2): STG = LTGs due to ELOS  Skilled Therapeutic Interventions/Progress Updates:    Treatment session with focus on activity tolerance during self-care tasks.  Pt received upright in w/c reporting already dressed this AM and declining bathing as he had showered yesterday.  Engaged in discussion regarding energy conservation and spacing out activities to increase success.  Pt ambulated to sink with RW mod I.  Completed oral care in sitting for energy conservation.  Pt reports typical routine tasks at home.  Engaged in making bed in room, with pt requiring seated rest break half way through due to increased labored breathing and decreased O2 sats.  Educated pt on pacing himself, taking rest breaks, and listening to his body as he typically can feel the increased labored breathing.  O2 sats dropped to 82% on  4L during bed making task, returned to low 90s in <30 seconds.  Pt reports ready for d/c home and understanding that he will just have to be "patient with myself".  Therapy Documentation Precautions:  Precautions Precautions: Fall Precaution Comments: watch O2 sats, attempt to wean Restrictions Weight Bearing Restrictions: No Pain: Pain Assessment Pain Scale: 0-10 Pain Score: 0-No pain   Therapy/Group: Individual Therapy  Simonne Come 12/15/2018, 12:09 PM

## 2018-12-15 NOTE — Progress Notes (Signed)
Physical Therapy Discharge Summary  Patient Details  Name: Andrew Larsen MRN: [id number] Date of Birth: 1940/11/16  Today's Date: 12/15/2018 PT Individual Time: 3790-2409 PT Individual Time Calculation (min): 40 min    Patient has met 3 of 8 long term goals due to improved activity tolerance, improved balance, improved postural control, increased strength, ability to compensate for deficits and improved awareness.  Patient to discharge at an ambulatory level Supervision with RW. Patient reports his daughter has been present during sessions & observed pt's CLOF.  Reasons goals not met: pt requires supervision 2/2 impaired balance & endurance  Recommendation:  Patient will benefit from ongoing skilled PT services in home health setting to continue to advance safe functional mobility, address ongoing impairments in balance, endurance, gait with LRAD, stair negotiation, and minimize fall risk.  Equipment: W/c for community use (pt already has RW at home)  Reasons for discharge: discharge from hospital  Patient/family agrees with progress made and goals achieved: Yes   Skilled PT Treatment: Pt received in w/c & agreeable to tx, denying c/o pain. Pt dons tennis shoes with mod I from w/c level. Pt reports no questions/concerns re: d/c home tomorrow. Pt transfers sit>stand with RW & mod I. Pt ambulates 100 ft with RW & mod I then requires seated rest break 2/2 SOB. SpO2 = 84% on 4L/min & increased to >90% within 90 seconds. Therapist provides cuing for pursed lip breathing throughout session. Pt completes car transfer at SUV simulated height with set up assist and negotiates 4 steps with B rails and supervision with pt electing to descend steps backwards. Pt returned to room via w/c dependent assist for time management. SpO2 = 91% when pt returned to room, still on 4L/min. Pt doffs tennis shoes, reporting this alleviates burning/tingling in BLE feet. Pt left in w/c with chair pad alarm donned, call  bell in reach.   Pt on 4L/min supplemental oxygen via nasal cannula throughout session.  Pain: pt reports burning/tingling in BLE feet, doffed shoes for increased comfort & made RN aware.   PT Discharge Precautions/Restrictions Precautions Precautions: Fall Precaution Comments: watch O2 sats Restrictions Weight Bearing Restrictions: No  Vision/Perception  Pt reports he wears glasses at all times at baseline. Pt denies changes in baseline vision.  Perception Perception: Within Functional Limits Praxis Praxis: Intact   Cognition Overall Cognitive Status: Within Functional Limits for tasks assessed Arousal/Alertness: Awake/alert Orientation Level: Oriented X4 Attention: Selective;Alternating Selective Attention: Appears intact Alternating Attention: Appears intact Memory: Appears intact Awareness: Appears intact Problem Solving: Appears intact Safety/Judgment: Appears intact   Sensation Sensation Coordination Fine Motor Movements are Fluid and Coordinated: Yes Pt reports hx of neuropathy & feeling burning/tingling in BLE that has worsened since yesterday, but nurse aware.   Motor  Motor Motor: Abnormal postural alignment and control Motor - Skilled Clinical Observations: generalized weakness Motor - Discharge Observations: generalized weakness   Mobility Bed Mobility Bed Mobility: Rolling Right;Rolling Left;Supine to Sit;Sit to Supine Rolling Right: Supervision/verbal cueing Rolling Left: Supervision/Verbal cueing Supine to Sit: Supervision/Verbal cueing Sit to Supine: Supervision/Verbal cueing Transfers Transfers: Sit to Stand;Stand to Sit Sit to Stand: Independent with assistive device Stand to Sit: Independent with assistive device Transfer (Assistive device): Rolling walker   Locomotion  Gait Ambulation: Yes Gait Assistance: Supervision/Verbal cueing Gait Distance (Feet): 100 Feet Assistive device: Rolling walker Gait Gait: Yes Gait velocity:  Decreased Stairs / Additional Locomotion Stairs: Yes Stairs Assistance: Supervision/Verbal cueing Stair Management Technique: Two rails Number of Stairs: 4 Height of Stairs:  6(inches) Architect: Yes Wheelchair Assistance: Supervision/Verbal cueing Wheelchair Parts Management: Needs assistance Distance: 95 ft   Trunk/Postural Assessment  Cervical Assessment Cervical Assessment: Exceptions to WFL(forward head) Thoracic Assessment Thoracic Assessment: Within Functional Limits Lumbar Assessment Lumbar Assessment: Exceptions to WFL(posterior pelvic tilt) Postural Control Postural Control: Deficits on evaluation Righting Reactions: slightly delayed Protective Responses: slightly delayed   Balance Balance Balance Assessed: Yes Dynamic Standing Balance Dynamic Standing - Level of Assistance: 5: Stand by assistance(gait with RW)   Extremity Assessment  RUE Assessment RUE Assessment: Within Functional Limits General Strength Comments: grossly 4+/5 LUE Assessment LUE Assessment: Within Functional Limits General Strength Comments: grossly 4+/5  BLE not formally tested, AROM WFL, strength grossly 3+/5 as no buckling noted during gait/standing, but impaired eccentric control noted in LLE when descending stairs.    Waunita Schooner 12/15/2018, 3:48 PM

## 2018-12-16 DIAGNOSIS — R5381 Other malaise: Secondary | ICD-10-CM | POA: Diagnosis not present

## 2018-12-16 LAB — GLUCOSE, CAPILLARY
Glucose-Capillary: 162 mg/dL — ABNORMAL HIGH (ref 70–99)
Glucose-Capillary: 187 mg/dL — ABNORMAL HIGH (ref 70–99)

## 2018-12-16 NOTE — Discharge Instructions (Signed)
Inpatient Rehab Discharge Instructions  Andrew Larsen Discharge date and time: 12/16/18   Activities/Precautions/ Functional Status: Activity: no lifting, driving, or strenuous exercise till cleared by MD Diet: cardiac diet and diabetic diet Wound Care: none needed   Functional status:  ___ No restrictions     ___ Walk up steps independently _X__ 24/7 supervision/assistance   ___ Walk up steps with assistance ___ Intermittent supervision/assistance  ___ Bathe/dress independently ___ Walk with walker     ___ Bathe/dress with assistance ___ Walk Independently    ___ Shower independently ___ Walk with assistance    ___ Shower with assistance ___ No alcohol     ___ Return to work/school ________  Special Instructions: 1. Use 6 liters oxygen with activity and 5 liters at rest. 2. Use flutter valve at least 4 times a day. 3. CPAP as directed    COMMUNITY REFERRALS UPON DISCHARGE:    Home Health:   PT, OT, RN     Agency:[facility]   Phone:[phone]   Date of last service:12/16/2018  Medical Equipment/Items Ordered:WHEELCHAIR, [name], TUB BENCH, [name]  Agency/Supplier:[facility]   [phone]  [facility]- [place] O2 CONCENTRATOR AND PORTABLE TANKS WHILE PT WAS AT [facility]    My questions have been answered and I understand these instructions. I will adhere to these goals and the provided educational materials after my discharge from the hospital.  Patient/Caregiver Signature _______________________________ Date __________  Clinician Signature _______________________________________ Date __________  Please bring this form and your medication list with you to all your follow-up doctor's appointments.

## 2018-12-16 NOTE — Progress Notes (Signed)
Social Work Discharge Note   The overall goal for the admission was met for:   Discharge location: Yes-HOME WITH DAUGHTER WHO CAN PROVIDE 24 HR SUPERVISION  Length of Stay: Yes-15 DAYS  Discharge activity level: Yes-INDEPENDENT-SUPERVISION LEVEL  Home/community participation: Yes  Services provided included: MD, RD, PT, OT, SLP, RN, CM, Pharmacy, Neuropsych and SW  Financial Services: Private Insurance: [employer]  Follow-up services arranged: Home Health: [facility], DME: [facility]-WHEELCHAIR,ROLLING WALKER, [place] and Patient/Family has no preference for HH/DME agencies [place]  Comments (or additional information):DAUGHTER WAS IN FOR EDUCATION AND IT WENT WELL COMFORTABLE WITH HIS CARE NEEDS.   Patient/Family verbalized understanding of follow-up arrangements: Yes  Individual responsible for coordination of the follow-up plan: SELF & [name]-DAUGHTER  Confirmed correct DME delivered: Elease Hashimoto 12/16/2018    Elease Hashimoto

## 2018-12-16 NOTE — Progress Notes (Signed)
Patient discharged to home, accompanied by his daughter.

## 2018-12-16 NOTE — Progress Notes (Signed)
[place] PHYSICAL MEDICINE & REHABILITATION PROGRESS NOTE  Subjective/Complaints:  Pt reports ready to go home today- feeling good. On O2 4L or more based on SOB.  Per resp therapy note, refused CPAP last night, because needs CPAP with O2   ROS: Patient denies , nausea, vomiting, diarrhea,  shortness of breath or chest pain, joint or back pain,  .   Objective: Vital Signs: Blood pressure 116/61, pulse 82, temperature 98.1 F (36.7 C), resp. rate 20, height 5\' 11"  (1.803 m), weight 112.4 kg, SpO2 100 %. No results found. Recent Labs    12/15/18 0816  WBC 4.9  HGB 13.8  HCT 41.1  PLT 226   Recent Labs    12/15/18 0816  NA 135  K 4.1  CL 97*  CO2 27  GLUCOSE 247*  BUN 27*  CREATININE 1.44*  CALCIUM 9.1    Physical Exam: BP 116/61 (BP Location: Right Arm)   Pulse 82   Temp 98.1 F (36.7 C)   Resp 20   Ht 5\' 11"  (1.803 m)   Wt 112.4 kg   SpO2 100%   BMI 34.56 kg/m  Constitutional: vitals and labs reviewed. Pt awake, alert, appropriate, sitting up in manual w/c, eating breakfast and watching TV, NAD HEENT: EOMI, oral membranes moist Neck: supple Cardiovascular: RRR without murmur.    Respiratory: CTA Bilaterally a little tight still, but NO wheezes, rales or rhonchi today- much improved- no coughing fits again today- sats measured at 100%! This AM GI: BS +, non-tender, non-distended   Skin: Warm and dry.  Intact. Psych: Normal mood.  Normal behavior. Musc: Bilateral lower extremity edema trace-improved Neurological: Alert Motor: B/l UE 4+/5 proximal to distal, stable LLE: 4/5, improving RLE: 4-/5 HF, KE and 4/5 ADF/PF (baseline) Psych: pleasant.  Assessment/Plan: 1. Functional deficits secondary to debility which require 3+ hours per day of interdisciplinary therapy in a comprehensive inpatient rehab setting.  Physiatrist is providing close team supervision and 24 hour management of active medical problems listed below.  Physiatrist and rehab team  continue to assess barriers to discharge/monitor patient progress toward functional and medical goals  Care Tool:  Bathing    Body parts bathed by patient: Right arm, Left arm, Chest, Abdomen, Front perineal area, Right upper leg, Left upper leg, Face, Buttocks, Right lower leg, Left lower leg   Body parts bathed by helper: Buttocks, Right lower leg, Left lower leg     Bathing assist Assist Level: Set up assist     Upper Body Dressing/Undressing Upper body dressing   What is the patient wearing?: Pull over shirt    Upper body assist Assist Level: Set up assist    Lower Body Dressing/Undressing Lower body dressing      What is the patient wearing?: Pants     Lower body assist Assist for lower body dressing: Set up assist     Toileting Toileting    Toileting assist Assist for toileting: Independent with assistive device Assistive Device Comment: Urinal   Transfers Chair/bed transfer  Transfers assist     Chair/bed transfer assist level: Supervision/Verbal cueing     Locomotion Ambulation   Ambulation assist      Assist level: Supervision/Verbal cueing Assistive device: Walker-rolling Max distance: 100'   Walk 10 feet activity   Assist     Assist level: Supervision/Verbal cueing Assistive device: Walker-rolling   Walk 50 feet activity   Assist Walk 50 feet with 2 turns activity did not occur: Safety/medical concerns  Assist level:  Supervision/Verbal cueing Assistive device: Walker-rolling    Walk 150 feet activity   Assist Walk 150 feet activity did not occur: Safety/medical concerns         Walk 10 feet on uneven surface  activity   Assist Walk 10 feet on uneven surfaces activity did not occur: Safety/medical concerns         Wheelchair     Assist Will patient use wheelchair at discharge?: Yes Type of Wheelchair: Manual Wheelchair activity did not occur: N/A  Wheelchair assist level: Supervision/Verbal cueing Max  wheelchair distance: 95    Wheelchair 50 feet with 2 turns activity    Assist    Wheelchair 50 feet with 2 turns activity did not occur: N/A   Assist Level: Supervision/Verbal cueing   Wheelchair 150 feet activity     Assist Wheelchair 150 feet activity did not occur: Safety/medical concerns       CBG (last 3)  Recent Labs    12/15/18 2100 12/16/18 0630 12/16/18 1209  GLUCAP 150* 162* 187*     Medical Problem List and Plan: 1.Functional and mobility deficitssecondary to debility after ARDS, COVID  Continue CIR PT, OT, SLP  2. Antithrombotics: -DVT/anticoagulation:Pharmaceutical:Lovenox -antiplatelet therapy: ASA 81 3. Pain Management:tylenol prn  Gabapentin 600 3 times daily  Stable on 8/14 4. Mood:LCSW to follow for evaluation and support. -antipsychotic agents: N/a 5. Neuropsych: This patientiscapable of making decisions onhisown behalf. 6. Skin/Wound Care:Air mattress overlay. Local care to sacral decub.  8/10- pt reports "almost healed" 7. Fluids/Electrolytes/Nutrition:Monitor I/Os.  8. HTN: Monitor BP   Continue Lasix  Continue metoprolol 25mg   Stable on 8/14  Monitor with increased mobility 9. CKD III:   Cr.  1.36 8/14  8/18- Cr 1.44 and BUN 27- stable for pt- needs to drink a little more PO  Cont to monitor 9. T2DM with hyperglycemia: Hgb A1C- 7.3.   SSI  NovoLog 70/30 18 units at bedtime, increased to 23u  NovoLog 70/30 34 units daily--increase to 36u   CBG (last 3)  Recent Labs    12/15/18 2100 12/16/18 0630 12/16/18 1209  GLUCAP 150* 162* 187*  controlled   10. CAD: Continue Lasix and lopressor.  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  12. OSA: CPAP- ? Follow up plans for this  8/17- will ask SW to reorder a CPAP that will attach to  O2.  8/18- going home on O2  13. Thrombocytopenia: Resolved  Plts 184 on 8/10  Cont to monitor 14. Hyponatremia: Resolved  Cont to monitor  8/11- Na 138- will check later in week 15. Cough/Hypoxic respiratory failure/COVID: Encourage IS, FV, OOB  Wean supplemental oxygen as tolerated, continues to require on 8/14   -likely will be a long wean  -added scheduled mucinex-dm on 8/9  - CXRs stable  -CCM recommends observation of pleural effusions given risk/benefit of draining. ?ECHO to look at [facility]  CXR shows no significant change- will recheck in 1 week.  -wbc's normal  8/12-increased Duonebs to 4x/day, scheduled hydrocodone cough syrup BID  IS is Broken will get new unit  16. Hypoalbuminemia  Supplement initiated on 8/4 17. Leukopenia  WBCs 4.0 8/14  Cont to monitor 18.  Tachycardia-secondary deconditioning, also with hypoxia   Continue metoprolol 25  Improving, in 80's 19.  Urinary frequency  Continue Ditropan 5mg  20. Dispo- team  conference today- d/c date 8/18  -spoke to Linna Hoff- he is checking on CPAP and O2 before d/c.  - will see pt in clinic in f/u       LOS: 15 days A FACE TO [place] 12/16/2018, 1:21 PM

## 2018-12-17 DIAGNOSIS — R5381 Other malaise: Secondary | ICD-10-CM | POA: Diagnosis not present

## 2018-12-17 DIAGNOSIS — U071 COVID-19: Secondary | ICD-10-CM | POA: Diagnosis not present

## 2018-12-18 DIAGNOSIS — U071 COVID-19: Secondary | ICD-10-CM | POA: Diagnosis not present

## 2018-12-18 DIAGNOSIS — R5381 Other malaise: Secondary | ICD-10-CM | POA: Diagnosis not present

## 2018-12-19 DIAGNOSIS — E875 Hyperkalemia: Secondary | ICD-10-CM | POA: Diagnosis not present

## 2018-12-19 DIAGNOSIS — I130 Hypertensive heart and chronic kidney disease with heart failure and stage 1 through stage 4 chronic kidney disease, or unspecified chronic kidney disease: Secondary | ICD-10-CM | POA: Diagnosis not present

## 2018-12-19 DIAGNOSIS — N183 Chronic kidney disease, stage 3 (moderate): Secondary | ICD-10-CM | POA: Diagnosis not present

## 2018-12-19 DIAGNOSIS — U071 COVID-19: Secondary | ICD-10-CM | POA: Diagnosis not present

## 2018-12-19 DIAGNOSIS — L89152 Pressure ulcer of sacral region, stage 2: Secondary | ICD-10-CM | POA: Diagnosis not present

## 2018-12-19 DIAGNOSIS — I5032 Chronic diastolic (congestive) heart failure: Secondary | ICD-10-CM | POA: Diagnosis not present

## 2018-12-19 DIAGNOSIS — E1049 Type 1 diabetes mellitus with other diabetic neurological complication: Secondary | ICD-10-CM | POA: Diagnosis not present

## 2018-12-19 DIAGNOSIS — Z794 Long term (current) use of insulin: Secondary | ICD-10-CM | POA: Diagnosis not present

## 2018-12-19 DIAGNOSIS — R5381 Other malaise: Secondary | ICD-10-CM | POA: Diagnosis not present

## 2018-12-20 DIAGNOSIS — G7241 Inclusion body myositis [IBM]: Secondary | ICD-10-CM | POA: Diagnosis not present

## 2018-12-20 DIAGNOSIS — E1165 Type 2 diabetes mellitus with hyperglycemia: Secondary | ICD-10-CM | POA: Diagnosis not present

## 2018-12-20 DIAGNOSIS — E1122 Type 2 diabetes mellitus with diabetic chronic kidney disease: Secondary | ICD-10-CM | POA: Diagnosis not present

## 2018-12-20 DIAGNOSIS — U071 COVID-19: Secondary | ICD-10-CM | POA: Diagnosis not present

## 2018-12-20 DIAGNOSIS — E1140 Type 2 diabetes mellitus with diabetic neuropathy, unspecified: Secondary | ICD-10-CM | POA: Diagnosis not present

## 2018-12-20 DIAGNOSIS — I130 Hypertensive heart and chronic kidney disease with heart failure and stage 1 through stage 4 chronic kidney disease, or unspecified chronic kidney disease: Secondary | ICD-10-CM | POA: Diagnosis not present

## 2018-12-20 DIAGNOSIS — I2510 Atherosclerotic heart disease of native coronary artery without angina pectoris: Secondary | ICD-10-CM | POA: Diagnosis not present

## 2018-12-20 DIAGNOSIS — N400 Enlarged prostate without lower urinary tract symptoms: Secondary | ICD-10-CM | POA: Diagnosis not present

## 2018-12-20 DIAGNOSIS — I5032 Chronic diastolic (congestive) heart failure: Secondary | ICD-10-CM | POA: Diagnosis not present

## 2018-12-20 DIAGNOSIS — N183 Chronic kidney disease, stage 3 (moderate): Secondary | ICD-10-CM | POA: Diagnosis not present

## 2018-12-22 NOTE — Discharge Summary (Signed)
Physician Discharge Summary  Patient ID: Andrew Larsen MRN: [id number] DOB/AGE: [birth year] 78 y.o.  Admit date: 12/01/2018 Discharge date: 12/22/2018  Discharge Diagnoses:  Principal Problem:   Physical debility Active Problems:   Hypoalbuminemia due to protein-calorie malnutrition (HCC)   Supplemental oxygen dependent   Controlled type 2 diabetes mellitus with hyperglycemia (HCC)   CKD (chronic kidney disease), stage III (HCC)   Benign essential HTN   Urinary frequency   SOB (shortness of breath)   Discharged Condition: stable   Significant Diagnostic Studies: Dg Chest 2 View  Result Date: 12/08/2018 CLINICAL DATA:  Shortness of breath. Cough. COVID-19 viral infection. EXAM: CHEST - 2 VIEW COMPARISON:  12/04/2018 FINDINGS: Heterogeneous bilateral pulmonary airspace disease shows no significant change compared to previous study. Heart size is stable. No evidence of pneumothorax or significant pleural effusion. IMPRESSION: No significant change in heterogeneous bilateral pulmonary airspace disease. Electronically Signed   By: Marlaine Hind M.D.   On: 12/08/2018 08:06   Dg Chest 2 View  Result Date: 12/04/2018 CLINICAL DATA:  Shortness of breath, COVID-19 positive EXAM: CHEST - 2 VIEW COMPARISON:  Radiograph 11/28/2018 FINDINGS: Worsening diffuse bilateral airspace disease. Small bilateral effusions, right greater than left. No pneumothorax. Stable cardiomegaly. No acute osseous or soft tissue abnormality. Degenerative changes are present in the imaged spine and shoulders. IMPRESSION: Worsening diffuse bilateral airspace disease and small bilateral effusions. Electronically Signed   By: Lovena Le M.D.   On: 12/04/2018 14:29   Dg Chest Port 1 View  Result Date: 11/28/2018 CLINICAL DATA:  PICC line placement. EXAM: PORTABLE CHEST 1 VIEW COMPARISON:  11/19/2018. FINDINGS: Interval removal of left IJ line. Right PICC line noted with tip over region of superior vena cava. Stable  cardiomegaly. Diffuse bilateral airspace disease again noted. Small bilateral pleural effusions noted. No pneumothorax. IMPRESSION: 1. Interim removal of left IJ line. Right PICC line noted with tip over the region of superior vena cava. 2.  Stable cardiomegaly. 3. Bilateral airspace disease again noted without interim change. Small bilateral pleural effusions noted on today's exam. Electronically Signed   By: Marcello Moores  Register   On: 11/28/2018 15:28    Labs:  Basic Metabolic Panel: BMP Latest Ref Rng & Units 12/15/2018 12/12/2018 12/08/2018  Glucose 70 - 99 mg/dL 247(H) 200(H) 269(H)  BUN 8 - 23 mg/dL 27(H) 25(H) 22  Creatinine 0.61 - 1.24 mg/dL 1.44(H) 1.36(H) 1.49(H)  Sodium 135 - 145 mmol/L 135 135 138  Potassium 3.5 - 5.1 mmol/L 4.1 4.3 4.6  Chloride 98 - 111 mmol/L 97(L) 97(L) 98  CO2 22 - 32 mmol/L 27 28 31   Calcium 8.9 - 10.3 mg/dL 9.1 8.7(L) 8.9    CBC: CBC Latest Ref Rng & Units 12/15/2018 12/12/2018 12/08/2018  WBC 4.0 - 10.5 K/uL 4.9 4.0 3.4(L)  Hemoglobin 13.0 - 17.0 g/dL 13.8 12.6(L) 13.8  Hematocrit 39.0 - 52.0 % 41.1 37.6(L) 41.0  Platelets 150 - 400 K/uL 226 185 184    CBG: No results for input(s): GLUCAP in the last 168 hours.  Brief HPI:   Andrew Larsen is a 78 year old male with history of CKD, PAD, HTN, T2DM, OSA, recent diagnosis of inclusion body myositis who was admitted on 11/03/18 with non productive cough and SOB. He had been exposed to multiple family members with COVID and found out that his outpatient test positive. He was admitted for observation with CXR showing bilateral interstitial prominence due to pneumonitis v/s CHF. He continued to decline and was treated with  Remdesivir, Actemra, steriods and convalescent plasma. He continued to require 60%  HFNC and was slowly weaned down to 4-6 L oxygen per [place].  Therapy ongoing and patient limited by bouts of coughing, BLE weakness, DOE as well as hypoxia. CIR was recommended due to functional decline.    Hospital  Course: Andrew Larsen was admitted to rehab 12/01/2018 for inpatient therapies to consist of PT, ST and OT at least three hours five days a week. Past admission physiatrist, therapy team and rehab RN have worked together to provide customized collaborative inpatient rehab.  He continued to have desaturation with activity and PCCM was consulted for input. CXR was stable with small pleural effusions and Dr. Ruthann Cancer did not recommend drainage. Pulmonary hygeine with flutter valve as well as goals to sat 88% at rest. His endurance and respiratory status has slowly improved.  He has been weaned down to 4- 5 L with activity, educated on pursed lip breathing, pacing himself with rest breaks and adjusting oxygen with activity. duonebs increased to qid and hydrocodone cough syrup was scheduled bid to help manage ongoing issue with coughing.   CPAP was resumed during his stay and attachment for oxygen was ordered to be used with  home CPAP.  .     Diabetes has been monitored with ac/hs cbg checks and 70/30 insulin has been titrated upwards for better control. Lytes have been monitored with serial checks revealing that hyponatremia has resolved. Blood pressures have been monitored on tid basis and have been controlled on lasix and metoprolol. Sacral decub has healed with local measures and air mattress for pressure relief. Serial CBC showed that leucopenia and thrombocytopenia has resolved. Acute on chronic renal failure has improved overall with SCr down to 1.44. His po intake has been good and he is continent of bowel and bladder. He has made good gains during his rehab stay and is at supervision level.  He will continue to receive follow up [place] [place] [facility] [place] [facility] after discharge.    Rehab course: During patient's stay in rehab weekly team conferences were held to monitor patient's progress, set goals and discuss barriers to discharge. At admission, patient required min assist with mobility and basic  self care tasks.  He  has had improvement in activity tolerance, balance, postural control as well as ability to compensate for deficits.  He requires supervision set up for bathing and dressing.  He is modified independent with toilet transfers and toileting.  He is modified independent for transfers and is able to perform sit to stand transfers and ambulate 100 feet with rolling walker at modified independent level.  He continues to require rest breaks with activity.  Family education was completed with daughter regarding all aspects of care and safety.   Disposition:  Home  Diet: Diabetic restrictions.   Special Instructions: 1. Continue to use flutter valve every 2 hours while awake. 2. Monitor blood sugars 2-3 times a day and record.    Discharge Instructions    Ambulatory referral to Physical Medicine Rehab   Complete by: As directed    Moderate complexity follow-up 1 to 2 weeks debility with ARDS multi-medical   Ambulatory referral to Pulmonology   Complete by: As directed    Post COVID. Hypoxia     Allergies as of 12/16/2018      Reactions   Metformin And Related    Simbrinza [brinzolamide-brimonidine]    Drainage, redness to eyes      Medication List  STOP taking these medications   insulin NPH-regular Human (70-30) 100 UNIT/ML injection   olmesartan 20 MG tablet Commonly known as: BENICAR   valsartan 160 MG tablet Commonly known as: DIOVAN     TAKE these medications   ascorbic acid 500 MG tablet Commonly known as: VITAMIN C Take 1 tablet (500 mg total) by mouth daily.   aspirin 81 MG chewable tablet Chew 81 mg by mouth at bedtime.   benzonatate 200 MG capsule Commonly known as: TESSALON Take 1 capsule (200 mg total) by mouth 3 (three) times daily as needed for cough.   calcium carbonate 600 MG Tabs tablet Commonly known as: Calcium 600 Take 2 tablets (1,200 mg total) by mouth daily.   cilostazol 100 MG tablet Commonly known as: PLETAL Take 1 tablet  (100 mg total) by mouth 2 (two) times daily.   Combigan 0.2-0.5 % ophthalmic solution Generic drug: brimonidine-timolol Place 1 drop into both eyes every 12 (twelve) hours.   dextromethorphan-guaiFENesin 30-600 MG 12hr tablet Commonly known as: MUCINEX DM Take 1 tablet by mouth 2 (two) times daily.   guaiFENesin-dextromethorphan 100-10 MG/5ML syrup Commonly known as: ROBITUSSIN DM Take 5-10 mLs by mouth every 6 (six) hours as needed for cough.   docusate sodium 100 MG capsule Commonly known as: COLACE Take 1 capsule (100 mg total) by mouth 2 (two) times daily as needed for mild constipation or moderate constipation.   famotidine 20 MG tablet Commonly known as: PEPCID Take 1 tablet (20 mg total) by mouth at bedtime.   folic acid 1 MG tablet Commonly known as: FOLVITE Take 1 tablet (1 mg total) by mouth daily.   furosemide 40 MG tablet Commonly known as: LASIX Take 1 tablet (40 mg total) by mouth daily.   gabapentin 300 MG capsule Commonly known as: NEURONTIN Take 2 capsules (600 mg total) by mouth 3 (three) times daily.   latanoprost 0.005 % ophthalmic solution Commonly known as: XALATAN Place 1 drop into both eyes at bedtime.   magnesium gluconate 500 MG tablet Commonly known as: MAGONATE Take 500 mg by mouth daily.   metoprolol tartrate 25 MG tablet Commonly known as: LOPRESSOR Take 1 tablet (25 mg total) by mouth 2 (two) times daily.   multivitamin capsule Take 1 capsule by mouth daily.   NovoLOG Mix 70/30 FlexPen (70-30) 100 UNIT/ML FlexPen Generic drug: insulin aspart protamine - aspart Inject 0.36 mLs (36 Units total) into the skin daily with breakfast.   insulin aspart protamine- aspart (70-30) 100 UNIT/ML injection Commonly known as: NovoLOG Mix 70/30 Inject 0.23 mLs (23 Units total) into the skin daily with supper.   oxybutynin 5 MG tablet Commonly known as: DITROPAN Take 1 tablet (5 mg total) by mouth 2 (two) times daily.   pantoprazole 40 MG  tablet Commonly known as: PROTONIX Take 1 tablet (40 mg total) by mouth daily.   potassium chloride SA 20 MEQ tablet Commonly known as: K-DUR Take 1 tablet (20 mEq total) by mouth daily.   Repatha 140 MG/ML Sosy Generic drug: Evolocumab Inject 140 mg into the skin every 14 (fourteen) days.   senna-docusate 8.6-50 MG tablet Commonly known as: Senokot-S Take 2 tablets by mouth 2 (two) times daily.   tamsulosin 0.4 MG Caps capsule Commonly known as: FLOMAX Take 0.4 mg by mouth daily.   Vitamin D3 125 MCG (5000 UT) Tabs Take 10,000 Units by mouth daily.   zinc sulfate 220 (50 Zn) MG capsule Take 1 capsule (220 mg total) by mouth daily.  Follow-up Information    Lovorn, Jinny Blossom, MD Follow up.   Specialty: Physical Medicine and Rehabilitation Why: Office will call you with follow up appointment Contact information: [id number] [address] [address] [phone]        Velna Hatchet, MD Follow up.   Specialty: Internal Medicine Why: call for post hospital follow up Contact information: [place] [id number] [phone]           Signed: Bary Leriche 12/23/2018, 4:10 PM

## 2018-12-25 ENCOUNTER — Encounter: Payer: Medicare HMO | Admitting: Physical Medicine and Rehabilitation

## 2018-12-30 DIAGNOSIS — I130 Hypertensive heart and chronic kidney disease with heart failure and stage 1 through stage 4 chronic kidney disease, or unspecified chronic kidney disease: Secondary | ICD-10-CM | POA: Diagnosis not present

## 2018-12-30 DIAGNOSIS — E1140 Type 2 diabetes mellitus with diabetic neuropathy, unspecified: Secondary | ICD-10-CM | POA: Diagnosis not present

## 2018-12-30 DIAGNOSIS — E1165 Type 2 diabetes mellitus with hyperglycemia: Secondary | ICD-10-CM | POA: Diagnosis not present

## 2018-12-30 DIAGNOSIS — I5032 Chronic diastolic (congestive) heart failure: Secondary | ICD-10-CM | POA: Diagnosis not present

## 2018-12-30 DIAGNOSIS — G7241 Inclusion body myositis [IBM]: Secondary | ICD-10-CM | POA: Diagnosis not present

## 2018-12-30 DIAGNOSIS — N183 Chronic kidney disease, stage 3 (moderate): Secondary | ICD-10-CM | POA: Diagnosis not present

## 2018-12-30 DIAGNOSIS — E1122 Type 2 diabetes mellitus with diabetic chronic kidney disease: Secondary | ICD-10-CM | POA: Diagnosis not present

## 2018-12-30 DIAGNOSIS — N400 Enlarged prostate without lower urinary tract symptoms: Secondary | ICD-10-CM | POA: Diagnosis not present

## 2018-12-30 DIAGNOSIS — U071 COVID-19: Secondary | ICD-10-CM | POA: Diagnosis not present

## 2018-12-31 DIAGNOSIS — I130 Hypertensive heart and chronic kidney disease with heart failure and stage 1 through stage 4 chronic kidney disease, or unspecified chronic kidney disease: Secondary | ICD-10-CM | POA: Diagnosis not present

## 2018-12-31 DIAGNOSIS — E1165 Type 2 diabetes mellitus with hyperglycemia: Secondary | ICD-10-CM | POA: Diagnosis not present

## 2018-12-31 DIAGNOSIS — E1140 Type 2 diabetes mellitus with diabetic neuropathy, unspecified: Secondary | ICD-10-CM | POA: Diagnosis not present

## 2018-12-31 DIAGNOSIS — E1122 Type 2 diabetes mellitus with diabetic chronic kidney disease: Secondary | ICD-10-CM | POA: Diagnosis not present

## 2018-12-31 DIAGNOSIS — I5032 Chronic diastolic (congestive) heart failure: Secondary | ICD-10-CM | POA: Diagnosis not present

## 2018-12-31 DIAGNOSIS — N400 Enlarged prostate without lower urinary tract symptoms: Secondary | ICD-10-CM | POA: Diagnosis not present

## 2018-12-31 DIAGNOSIS — N183 Chronic kidney disease, stage 3 (moderate): Secondary | ICD-10-CM | POA: Diagnosis not present

## 2018-12-31 DIAGNOSIS — U071 COVID-19: Secondary | ICD-10-CM | POA: Diagnosis not present

## 2018-12-31 DIAGNOSIS — G7241 Inclusion body myositis [IBM]: Secondary | ICD-10-CM | POA: Diagnosis not present

## 2019-01-01 ENCOUNTER — Telehealth: Payer: Self-pay

## 2019-01-01 NOTE — Telephone Encounter (Signed)
Called the pt and notified MW can see him tomorrow  He is going to check with his spouse and call us back about what time he can come in

## 2019-01-01 NOTE — Telephone Encounter (Signed)
Returned call to patient regarding follow up on covid pneumonia and hospital admission. Patient admitted to [facility] for covid pneumonia testing positive 11/03/18.  Hospital admission from 11/03/18 - 12/01/18.  Covid test negative on 11/26/18 and patient sent to rehab after discharge.    Patient on oxygen and continues with physical therapy. States he is improving with continued SOB and cough with mostly white phlegm.  Would like to make appointment for hospital follow up.  Has not been seen in this office since July 2017 for cough/asthma with Dr. Melvyn Novas.    Informed patient we would clear appointment request with Dr. Melvyn Novas due to length of time he has not been to office and as FYI covid follow up and then we would return call to patient.  Dr. Melvyn Novas please advise regarding seeing this patient post covid admission and will need to be new consult for you due to not having been seen in 3 years (LOV July 2017).    Route to Dr. Melvyn Novas

## 2019-01-01 NOTE — Telephone Encounter (Signed)
Ok to schedule for next avail consult slot but needs to bring all meds

## 2019-01-02 DIAGNOSIS — E1140 Type 2 diabetes mellitus with diabetic neuropathy, unspecified: Secondary | ICD-10-CM | POA: Diagnosis not present

## 2019-01-02 DIAGNOSIS — E1122 Type 2 diabetes mellitus with diabetic chronic kidney disease: Secondary | ICD-10-CM | POA: Diagnosis not present

## 2019-01-02 DIAGNOSIS — N183 Chronic kidney disease, stage 3 (moderate): Secondary | ICD-10-CM | POA: Diagnosis not present

## 2019-01-02 DIAGNOSIS — N400 Enlarged prostate without lower urinary tract symptoms: Secondary | ICD-10-CM | POA: Diagnosis not present

## 2019-01-02 DIAGNOSIS — I5032 Chronic diastolic (congestive) heart failure: Secondary | ICD-10-CM | POA: Diagnosis not present

## 2019-01-02 DIAGNOSIS — G7241 Inclusion body myositis [IBM]: Secondary | ICD-10-CM | POA: Diagnosis not present

## 2019-01-02 DIAGNOSIS — U071 COVID-19: Secondary | ICD-10-CM | POA: Diagnosis not present

## 2019-01-02 DIAGNOSIS — E1165 Type 2 diabetes mellitus with hyperglycemia: Secondary | ICD-10-CM | POA: Diagnosis not present

## 2019-01-02 DIAGNOSIS — I130 Hypertensive heart and chronic kidney disease with heart failure and stage 1 through stage 4 chronic kidney disease, or unspecified chronic kidney disease: Secondary | ICD-10-CM | POA: Diagnosis not present

## 2019-01-03 DIAGNOSIS — G4733 Obstructive sleep apnea (adult) (pediatric): Secondary | ICD-10-CM | POA: Diagnosis not present

## 2019-01-07 ENCOUNTER — Encounter: Payer: Self-pay | Admitting: Pulmonary Disease

## 2019-01-07 ENCOUNTER — Ambulatory Visit: Payer: Medicare HMO | Admitting: Pulmonary Disease

## 2019-01-07 ENCOUNTER — Encounter: Payer: Self-pay | Admitting: Physical Medicine and Rehabilitation

## 2019-01-07 ENCOUNTER — Encounter: Payer: Medicare HMO | Attending: Physical Medicine and Rehabilitation | Admitting: Physical Medicine and Rehabilitation

## 2019-01-07 ENCOUNTER — Other Ambulatory Visit: Payer: Self-pay

## 2019-01-07 VITALS — BP 124/78 | HR 92 | Ht 71.0 in | Wt 259.4 lb

## 2019-01-07 VITALS — BP 106/65 | HR 86 | Temp 98.6°F | Ht 71.0 in | Wt 260.0 lb

## 2019-01-07 DIAGNOSIS — F329 Major depressive disorder, single episode, unspecified: Secondary | ICD-10-CM

## 2019-01-07 DIAGNOSIS — R0602 Shortness of breath: Secondary | ICD-10-CM | POA: Insufficient documentation

## 2019-01-07 DIAGNOSIS — U071 COVID-19: Secondary | ICD-10-CM | POA: Diagnosis not present

## 2019-01-07 DIAGNOSIS — R5381 Other malaise: Secondary | ICD-10-CM | POA: Insufficient documentation

## 2019-01-07 DIAGNOSIS — J9611 Chronic respiratory failure with hypoxia: Secondary | ICD-10-CM | POA: Diagnosis not present

## 2019-01-07 DIAGNOSIS — R05 Cough: Secondary | ICD-10-CM | POA: Insufficient documentation

## 2019-01-07 DIAGNOSIS — Z8616 Personal history of COVID-19: Secondary | ICD-10-CM

## 2019-01-07 DIAGNOSIS — Z8619 Personal history of other infectious and parasitic diseases: Secondary | ICD-10-CM

## 2019-01-07 DIAGNOSIS — J988 Other specified respiratory disorders: Secondary | ICD-10-CM | POA: Diagnosis not present

## 2019-01-07 DIAGNOSIS — F4310 Post-traumatic stress disorder, unspecified: Secondary | ICD-10-CM

## 2019-01-07 DIAGNOSIS — F32A Depression, unspecified: Secondary | ICD-10-CM

## 2019-01-07 DIAGNOSIS — R059 Cough, unspecified: Secondary | ICD-10-CM

## 2019-01-07 MED ORDER — BENZONATATE 200 MG PO CAPS: 200.0000 mg | ORAL_CAPSULE | Freq: Three times a day (TID) | ORAL | 1 refills | Status: DC | PRN

## 2019-01-07 MED ORDER — ALBUTEROL SULFATE HFA 108 (90 BASE) MCG/ACT IN AERS: 2.0000 | INHALATION_SPRAY | RESPIRATORY_TRACT | 5 refills | Status: DC | PRN

## 2019-01-07 MED ORDER — HYDROCODONE-GUAIFENESIN 2.5-200 MG/5ML PO SOLN: 10.0000 mL | Freq: Four times a day (QID) | ORAL | 0 refills | Status: DC | PRN

## 2019-01-07 NOTE — Patient Instructions (Addendum)
Thank you for visiting Dr. Valeta Harms at [facility]. Today we recommend the following:  Orders Placed This Encounter  Procedures  . CT CHEST HIGH RESOLUTION   Return in about 8 weeks (around 03/04/2019).    Please do your part to reduce the spread of COVID-19.

## 2019-01-07 NOTE — Progress Notes (Signed)
Synopsis: Referred in September 2020 for COVID-19 survivor by Bary Leriche, PA-C  Subjective:   PATIENT ID: Andrew Larsen GENDER: male DOB: [birth year], MRN: [id number]  Chief Complaint  Patient presents with   Consult    Covid positive since July. Reports his SOB is at his baseline. Denies chest discomfort. Using 3-4 liters of oxygen. Reports he uses albuterol prn.     This is a 78 year old gentleman past medical history of CKD stage III, type 2 diabetes, hypertension, hyperlipidemia, sleep apnea.  Patient was initially admitted on 11/03/2018 after having several positive family exposures to COVID-19.  Ultimately was admitted to [facility].  Patient was transferred to the intensive care unit on July 10 for worsening hypoxic respiratory failure requiring heated high flow nasal cannula.  Ultimately was treated with steroids, Actemra, room to severe patient continued to improve and was ultimately discharged from the ICU.  Patient was discharged from [facility] on 12/01/2018 and admitted to [facility] inpatient rehabilitation.  Patient was discharged from inpatient rehab on 12/16/2018. Total 43 day hospitalization. MC+GVH+CIR days.   OV 01/07/2019: Today he has been doing well since discharge from rehab.  He is accompanied by his daughter.  He has slowly been increasing his activity level.  Recently started outpatient physical therapy.  He still remains weak.  He complains of some visual field loss, worsening of his dexterity, numbness in the ends of his fingers, some proprioception and walking difficulties with placement of his feet.  As for respiratory standpoint he occasionally has labored breathing.  Dry nonproductive cough.  No fevers chills weight loss.  Denies hemoptysis.  He is anxious about increasing his physicality.  He has noticed significant dyspnea on exertion.  He does feel however that the symptoms are slowly improving.   Past Medical History:  Diagnosis Date   CKD  (chronic kidney disease)    CKD stage III (01/2018)   Daytime somnolence    Diabetes mellitus without complication (HCC)    Fracture    right fibula/wears boot cast   GERD (gastroesophageal reflux disease)    Glaucoma    Hypercholesteremia    Hyperlipidemia    Hypertension    Mold exposure    PAD (peripheral artery disease) (HCC)    Sleep apnea      Family History  Problem Relation Age of Onset   Heart disease Father    Prostate cancer Brother    Cancer Brother    Diabetes Brother    Colon cancer Neg Hx      Past Surgical History:  Procedure Laterality Date   LOWER EXTREMITY ANGIOGRAPHY N/A 06/12/2016   Procedure: Lower Extremity Angiography;  Surgeon: Adrian Prows, MD;  Location: [place] [facility];  Service: Cardiovascular;  Laterality: N/A;   MUSCLE BIOPSY Right 05/06/2018   Procedure: right vastus lateralis muscle biopsy;  Surgeon: Earnie Larsson, MD;  Location: [place];  Service: Neurosurgery;  Laterality: Right;   ORIF ANKLE FRACTURE Right 10/02/2012   Procedure: OPEN REDUCTION INTERNAL FIXATION (ORIF) ANKLE FRACTURE;  Surgeon: Meredith Pel, MD;  Location: [facility];  Service: Orthopedics;  Laterality: Right;   PERIPHERAL VASCULAR ATHERECTOMY Left 06/12/2016   Procedure: Peripheral Vascular Atherectomy-Left Popliteal;  Surgeon: Adrian Prows, MD;  Location: [place] [facility];  Service: Cardiovascular;  Laterality: Left;   PERIPHERAL VASCULAR INTERVENTION Left 06/12/2016   Procedure: Peripheral Vascular Intervention- DCB Left Popliteal;  Surgeon: Adrian Prows, MD;  Location: [place] [facility];  Service: Cardiovascular;  Laterality:  Left;    Social History   Socioeconomic History   Marital status: Married    Spouse name: Not on file   Number of children: Not on file   Years of education: Not on file   Highest education level: Not on file  Occupational History   Not on file  Social Needs   Financial resource strain: Not on file   Food insecurity     Worry: Not on file    Inability: Not on file   Transportation needs    Medical: Not on file    Non-medical: Not on file  Tobacco Use   Smoking status: Former Smoker    Packs/day: 1.00    Years: 20.00    Pack years: 20.00    Quit date: 08/22/1981    Years since quitting: 37.4   Smokeless tobacco: Never Used  Substance and Sexual Activity   Alcohol use: No    Alcohol/week: 0.0 standard drinks   Drug use: No   Sexual activity: Never  Lifestyle   Physical activity    Days per week: Not on file    Minutes per session: Not on file   Stress: Not on file  Relationships   Social connections    Talks on phone: Not on file    Gets together: Not on file    Attends religious service: Not on file    Active member of club or organization: Not on file    Attends meetings of clubs or organizations: Not on file    Relationship status: Not on file   Intimate partner violence    Fear of current or ex partner: Not on file    Emotionally abused: Not on file    Physically abused: Not on file    Forced sexual activity: Not on file  Other Topics Concern   Not on file  Social History Narrative   ** Merged History Encounter **         Allergies  Allergen Reactions   Metformin And Related    Simbrinza [Brinzolamide-Brimonidine]     Drainage, redness to eyes     Outpatient Medications Prior to Visit  Medication Sig Dispense Refill   albuterol (VENTOLIN HFA) 108 (90 Base) MCG/ACT inhaler Inhale 2 puffs into the lungs every 4 (four) hours as needed for wheezing or shortness of breath. 18 g 5   aspirin 81 MG chewable tablet Chew 81 mg by mouth at bedtime.      benzonatate (TESSALON) 200 MG capsule Take 1 capsule (200 mg total) by mouth 3 (three) times daily as needed for cough. 90 capsule 1   brimonidine-timolol (COMBIGAN) 0.2-0.5 % ophthalmic solution Place 1 drop into both eyes every 12 (twelve) hours.     calcium carbonate (CALCIUM 600) 600 MG TABS tablet Take 2 tablets  (1,200 mg total) by mouth daily. 60 tablet 0   Cholecalciferol (VITAMIN D3) 5000 units TABS Take 10,000 Units by mouth daily.      cilostazol (PLETAL) 100 MG tablet Take 1 tablet (100 mg total) by mouth 2 (two) times daily. 60 tablet 0   docusate sodium (COLACE) 100 MG capsule Take 1 capsule (100 mg total) by mouth 2 (two) times daily as needed for mild constipation or moderate constipation. 10 capsule 0   Evolocumab (REPATHA) 140 MG/ML SOSY Inject 140 mg into the skin every 14 (fourteen) days.     folic acid (FOLVITE) 1 MG tablet Take 1 tablet (1 mg total) by mouth daily. 30 tablet 0  furosemide (LASIX) 40 MG tablet Take 1 tablet (40 mg total) by mouth daily. 30 tablet 0   gabapentin (NEURONTIN) 300 MG capsule Take 2 capsules (600 mg total) by mouth 3 (three) times daily. 90 capsule 0   HYDROcodone-guaiFENesin 2.5-200 MG/5ML SOLN Take 10 mLs by mouth every 6 (six) hours as needed (severe cough). 560 mL 0   insulin aspart protamine - aspart (NOVOLOG MIX 70/30 FLEXPEN) (70-30) 100 UNIT/ML FlexPen Inject 0.36 mLs (36 Units total) into the skin daily with breakfast. (Patient taking differently: Inject into the skin daily with breakfast. 60 units in AM and 50 units at night) 15 mL 11   latanoprost (XALATAN) 0.005 % ophthalmic solution Place 1 drop into both eyes at bedtime.     magnesium gluconate (MAGONATE) 500 MG tablet Take 500 mg by mouth daily.     metoprolol tartrate (LOPRESSOR) 25 MG tablet Take 1 tablet (25 mg total) by mouth 2 (two) times daily. 60 tablet 0   Multiple Vitamin (MULTIVITAMIN) capsule Take 1 capsule by mouth daily.      oxybutynin (DITROPAN) 5 MG tablet Take 1 tablet (5 mg total) by mouth 2 (two) times daily. 60 tablet 0   pantoprazole (PROTONIX) 40 MG tablet Take 1 tablet (40 mg total) by mouth daily. 30 tablet 0   potassium chloride SA (K-DUR) 20 MEQ tablet Take 1 tablet (20 mEq total) by mouth daily. 30 tablet 0   senna-docusate (SENOKOT-S) 8.6-50 MG tablet  Take 2 tablets by mouth 2 (two) times daily.     tamsulosin (FLOMAX) 0.4 MG CAPS capsule Take 0.4 mg by mouth daily.     vitamin C (VITAMIN C) 500 MG tablet Take 1 tablet (500 mg total) by mouth daily. 30 tablet 0   zinc sulfate 220 (50 Zn) MG capsule Take 1 capsule (220 mg total) by mouth daily. 30 capsule 0   famotidine (PEPCID) 20 MG tablet Take 1 tablet (20 mg total) by mouth at bedtime. 30 tablet 0   No facility-administered medications prior to visit.     Review of Systems  Constitutional: Positive for malaise/fatigue. Negative for chills, fever and weight loss.  HENT: Negative for hearing loss, sore throat and tinnitus.   Eyes: Negative for blurred vision and double vision.  Respiratory: Positive for cough, sputum production and shortness of breath. Negative for hemoptysis, wheezing and stridor.   Cardiovascular: Negative for chest pain, palpitations, orthopnea, leg swelling and PND.  Gastrointestinal: Negative for abdominal pain, constipation, diarrhea, heartburn, nausea and vomiting.  Genitourinary: Negative for dysuria, hematuria and urgency.  Musculoskeletal: Negative for joint pain and myalgias.  Skin: Negative for itching and rash.  Neurological: Negative for dizziness, tingling, weakness and headaches.  Endo/Heme/Allergies: Negative for environmental allergies. Does not bruise/bleed easily.  Psychiatric/Behavioral: Negative for depression. The patient is not nervous/anxious and does not have insomnia.   All other systems reviewed and are negative.    Objective:  Physical Exam Vitals signs reviewed.  Constitutional:      General: He is not in acute distress.    Appearance: He is well-developed. He is obese.  HENT:     Head: Normocephalic and atraumatic.  Eyes:     General: No scleral icterus.    Conjunctiva/sclera: Conjunctivae normal.     Pupils: Pupils are equal, round, and reactive to light.  Neck:     Musculoskeletal: Neck supple.     Vascular: No JVD.      Trachea: No tracheal deviation.  Cardiovascular:  Rate and Rhythm: Normal rate and regular rhythm.     Heart sounds: Normal heart sounds. No murmur.  Pulmonary:     Effort: Pulmonary effort is normal. No tachypnea, accessory muscle usage or respiratory distress.     Breath sounds: Normal breath sounds. No stridor. No wheezing, rhonchi or rales.  Abdominal:     General: Bowel sounds are normal. There is no distension.     Palpations: Abdomen is soft.     Tenderness: There is no abdominal tenderness.     Comments: Obese  Musculoskeletal:        General: No tenderness.  Lymphadenopathy:     Cervical: No cervical adenopathy.  Skin:    General: Skin is warm and dry.     Capillary Refill: Capillary refill takes less than 2 seconds.     Findings: No rash.  Neurological:     Mental Status: He is alert and oriented to person, place, and time.  Psychiatric:        Behavior: Behavior normal.      Vitals:   01/07/19 1454  BP: 124/78  Pulse: 92  SpO2: 100%  Weight: 259 lb 6.4 oz (117.7 kg)  Height: 5\' 11"  (1.803 m)   100% on 4 LPM  BMI Readings from Last 3 Encounters:  01/07/19 36.18 kg/m  01/07/19 36.26 kg/m  12/16/18 34.56 kg/m   Wt Readings from Last 3 Encounters:  01/07/19 259 lb 6.4 oz (117.7 kg)  01/07/19 260 lb (117.9 kg)  12/16/18 247 lb 12.8 oz (112.4 kg)     CBC    Component Value Date/Time   WBC 4.9 12/15/2018 0816   RBC 4.37 12/15/2018 0816   HGB 13.8 12/15/2018 0816   HCT 41.1 12/15/2018 0816   PLT 226 12/15/2018 0816   MCV 94.1 12/15/2018 0816   MCH 31.6 12/15/2018 0816   MCHC 33.6 12/15/2018 0816   RDW 15.8 (H) 12/15/2018 0816   LYMPHSABS 1.7 12/12/2018 0850   MONOABS 0.9 12/12/2018 0850   EOSABS 0.1 12/12/2018 0850   BASOSABS 0.0 12/12/2018 0850    Chest Imaging: 12/08/2018: Chest x-ray Bilateral pulmonary airspace disease. The patient's images have been independently reviewed by me.    Pulmonary Functions Testing Results: No flowsheet  data found.  FeNO: None   Pathology: None   Echocardiogram: None   Heart Catheterization: None     Assessment & Plan:     ICD-10-CM   1. History of 2019 novel coronavirus disease (COVID-19)  Z86.19 CT CHEST HIGH RESOLUTION    CANCELED: CT CHEST HIGH RESOLUTION  2. Chronic hypoxemic respiratory failure (HCC)  J96.11 CT CHEST HIGH RESOLUTION    CANCELED: CT CHEST HIGH RESOLUTION  3. PTSD (post-traumatic stress disorder)  F43.10   4. Depression, unspecified depression type  F32.9     Discussion:  This is a 78 year old gentleman recent prolonged hospital stay secondary to COVID pneumonia ultimately requiring inpatient rehabilitation.  At this time has chronic hypoxemic respiratory failure.  Last chest radiograph with persistent infiltrates.  Additionally he is suffering from depression anxiety and elements of PTSD related to his hospitalization.  He is not sleeping well at night.  He still states he feels anxious about getting worse again and having to go back to the hospital.  Experiencing lonely feelings.  Today we discussed the importance of him trying to build his physical endurance and tolerance back to where it was before.  He is working with outpatient physical therapy.  He states this does make him  feel better.  As for his persistent hypoxemia we will obtain a high-resolution CT scan of the chest.  There are some reports of development of post COVID interstitial lung disease-like patterns/DPLD.  Pending CT results we may need to consider further evaluation looking for reasons of persistent hypoxemia.  I believe neck steps would consider echocardiogram to look at left and right ventricular function as well as potentially VQ scanning.  Of note he did not have documented DVT on previous lower extremity Dopplers.  Patient to return to clinic in 6 to 8 weeks.  Continue to wean FiO2 to maintain O2 sats greater than 90%.  He has a at home SPO2 monitor.  Greater than 50% of this  patient's 60-minute office visit was been face-to-face discussing above recommendations, extensive review of medical record, counseling and treatment plan.   Current Outpatient Medications:    albuterol (VENTOLIN HFA) 108 (90 Base) MCG/ACT inhaler, Inhale 2 puffs into the lungs every 4 (four) hours as needed for wheezing or shortness of breath., Disp: 18 g, Rfl: 5   aspirin 81 MG chewable tablet, Chew 81 mg by mouth at bedtime. , Disp: , Rfl:    benzonatate (TESSALON) 200 MG capsule, Take 1 capsule (200 mg total) by mouth 3 (three) times daily as needed for cough., Disp: 90 capsule, Rfl: 1   brimonidine-timolol (COMBIGAN) 0.2-0.5 % ophthalmic solution, Place 1 drop into both eyes every 12 (twelve) hours., Disp: , Rfl:    calcium carbonate (CALCIUM 600) 600 MG TABS tablet, Take 2 tablets (1,200 mg total) by mouth daily., Disp: 60 tablet, Rfl: 0   Cholecalciferol (VITAMIN D3) 5000 units TABS, Take 10,000 Units by mouth daily. , Disp: , Rfl:    cilostazol (PLETAL) 100 MG tablet, Take 1 tablet (100 mg total) by mouth 2 (two) times daily., Disp: 60 tablet, Rfl: 0   docusate sodium (COLACE) 100 MG capsule, Take 1 capsule (100 mg total) by mouth 2 (two) times daily as needed for mild constipation or moderate constipation., Disp: 10 capsule, Rfl: 0   Evolocumab (REPATHA) 140 MG/ML SOSY, Inject 140 mg into the skin every 14 (fourteen) days., Disp: , Rfl:    folic acid (FOLVITE) 1 MG tablet, Take 1 tablet (1 mg total) by mouth daily., Disp: 30 tablet, Rfl: 0   furosemide (LASIX) 40 MG tablet, Take 1 tablet (40 mg total) by mouth daily., Disp: 30 tablet, Rfl: 0   gabapentin (NEURONTIN) 300 MG capsule, Take 2 capsules (600 mg total) by mouth 3 (three) times daily., Disp: 90 capsule, Rfl: 0   HYDROcodone-guaiFENesin 2.5-200 MG/5ML SOLN, Take 10 mLs by mouth every 6 (six) hours as needed (severe cough)., Disp: 560 mL, Rfl: 0   insulin aspart protamine - aspart (NOVOLOG MIX 70/30 FLEXPEN) (70-30) 100  UNIT/ML FlexPen, Inject 0.36 mLs (36 Units total) into the skin daily with breakfast. (Patient taking differently: Inject into the skin daily with breakfast. 60 units in AM and 50 units at night), Disp: 15 mL, Rfl: 11   latanoprost (XALATAN) 0.005 % ophthalmic solution, Place 1 drop into both eyes at bedtime., Disp: , Rfl:    magnesium gluconate (MAGONATE) 500 MG tablet, Take 500 mg by mouth daily., Disp: , Rfl:    metoprolol tartrate (LOPRESSOR) 25 MG tablet, Take 1 tablet (25 mg total) by mouth 2 (two) times daily., Disp: 60 tablet, Rfl: 0   Multiple Vitamin (MULTIVITAMIN) capsule, Take 1 capsule by mouth daily. , Disp: , Rfl:    oxybutynin (DITROPAN) 5 MG  tablet, Take 1 tablet (5 mg total) by mouth 2 (two) times daily., Disp: 60 tablet, Rfl: 0   pantoprazole (PROTONIX) 40 MG tablet, Take 1 tablet (40 mg total) by mouth daily., Disp: 30 tablet, Rfl: 0   potassium chloride SA (K-DUR) 20 MEQ tablet, Take 1 tablet (20 mEq total) by mouth daily., Disp: 30 tablet, Rfl: 0   senna-docusate (SENOKOT-S) 8.6-50 MG tablet, Take 2 tablets by mouth 2 (two) times daily., Disp:  , Rfl:    tamsulosin (FLOMAX) 0.4 MG CAPS capsule, Take 0.4 mg by mouth daily., Disp: , Rfl:    vitamin C (VITAMIN C) 500 MG tablet, Take 1 tablet (500 mg total) by mouth daily., Disp: 30 tablet, Rfl: 0   zinc sulfate 220 (50 Zn) MG capsule, Take 1 capsule (220 mg total) by mouth daily., Disp: 30 capsule, Rfl: 0   famotidine (PEPCID) 20 MG tablet, Take 1 tablet (20 mg total) by mouth at bedtime., Disp: 30 tablet, Rfl: 0   Garner Nash, DO [facility] 01/07/2019 3:27 PM

## 2019-01-07 NOTE — Addendum Note (Signed)
Addended by: Vivia Ewing on: 01/07/2019 03:54 PM   Modules accepted: Orders

## 2019-01-07 NOTE — Progress Notes (Signed)
Subjective:    Patient ID: Andrew Larsen, male    DOB: 05-08-1940, 78 y.o.   MRN: [id number]  HPI  Pt is a 78 yr old male with hx of inclusion body myositis, DM, and PVD here post hospitalization for COVID  CC SOB  Pt is a 78 yr old male  S/p COVID here for hospital f/u.  Since home, still requiring 3.5 to 4L of O2- hasn't had a lot of energy to get up and go. Came out 1x- so far- no schedule- OT came out 1x- no schedule as of yet there either.  Appetite is good. BGs has been outrageous- still in 200s to 300s for BGs. Most of the time. Checking 3x/day. Just off Januvia- didn't have to take it. 1 pill/day- just started back - hasn't gotten time to tell if working.  Up to 60 units of insulin in AM and 50 units at night. Took him off Metformin due to a reaction. Off Glipizide as well  Eating more since gotten home and not as active. Was on 50/45 units prior to going into the hospital. Saw the NP this past time, when saw PCP and hasn't addressed the DM at their last appointment.   Awful to hold a phone conversation. Still has episodes of severe coughing- 2-3x/day- night is worse.         Pain Inventory Average Pain 4 Pain Right Now 4 My pain is dull  In the last 24 hours, has pain interfered with the following? General activity 0 Relation with others 0 Enjoyment of life 0 What TIME of day is your pain at its worst? evening Sleep (in general) Fair  Pain is worse with: walking Pain improves with: rest Relief from Meds: na  Mobility use a walker ability to climb steps?  yes do you drive?  no use a wheelchair  Function retired  Neuro/Psych weakness tingling  Prior Studies Any changes since last visit?  no  Physicians involved in your care Any changes since last visit?  no   Family History  Problem Relation Age of Onset  . Heart disease Father   . Prostate cancer Brother   . Cancer Brother   . Diabetes Brother   . Colon cancer Neg Hx     Social History   Socioeconomic History  . Marital status: Married    Spouse name: Not on file  . Number of children: Not on file  . Years of education: Not on file  . Highest education level: Not on file  Occupational History  . Not on file  Social Needs  . Financial resource strain: Not on file  . Food insecurity    Worry: Not on file    Inability: Not on file  . Transportation needs    Medical: Not on file    Non-medical: Not on file  Tobacco Use  . Smoking status: Former Smoker    Packs/day: 1.00    Years: 20.00    Pack years: 20.00    Quit date: 08/22/1981    Years since quitting: 37.4  . Smokeless tobacco: Never Used  Substance and Sexual Activity  . Alcohol use: No    Alcohol/week: 0.0 standard drinks  . Drug use: No  . Sexual activity: Never  Lifestyle  . Physical activity    Days per week: Not on file    Minutes per session: Not on file  . Stress: Not on file  Relationships  . Social connections    Talks  on phone: Not on file    Gets together: Not on file    Attends religious service: Not on file    Active member of club or organization: Not on file    Attends meetings of clubs or organizations: Not on file    Relationship status: Not on file  Other Topics Concern  . Not on file  Social History Narrative   ** Merged History Encounter **       Past Surgical History:  Procedure Laterality Date  . LOWER EXTREMITY ANGIOGRAPHY N/A 06/12/2016   Procedure: Lower Extremity Angiography;  Surgeon: Adrian Prows, MD;  Location: [place] [facility];  Service: Cardiovascular;  Laterality: N/A;  . MUSCLE BIOPSY Right 05/06/2018   Procedure: right vastus lateralis muscle biopsy;  Surgeon: Earnie Larsson, MD;  Location: [place];  Service: Neurosurgery;  Laterality: Right;  . ORIF ANKLE FRACTURE Right 10/02/2012   Procedure: OPEN REDUCTION INTERNAL FIXATION (ORIF) ANKLE FRACTURE;  Surgeon: Meredith Pel, MD;  Location: [facility];  Service: Orthopedics;  Laterality: Right;  .  PERIPHERAL VASCULAR ATHERECTOMY Left 06/12/2016   Procedure: Peripheral Vascular Atherectomy-Left Popliteal;  Surgeon: Adrian Prows, MD;  Location: [place] [facility];  Service: Cardiovascular;  Laterality: Left;  . PERIPHERAL VASCULAR INTERVENTION Left 06/12/2016   Procedure: Peripheral Vascular Intervention- DCB Left Popliteal;  Surgeon: Adrian Prows, MD;  Location: [place] [facility];  Service: Cardiovascular;  Laterality: Left;   Past Medical History:  Diagnosis Date  . CKD (chronic kidney disease)    CKD stage III (01/2018)  . Daytime somnolence   . Diabetes mellitus without complication ([place])   . Fracture    right fibula/wears boot cast  . GERD (gastroesophageal reflux disease)   . Glaucoma   . Hypercholesteremia   . Hyperlipidemia   . Hypertension   . Mold exposure   . PAD (peripheral artery disease) ([place])   . Sleep apnea    BP 106/65   Pulse 86   Temp 98.6 F (37 C)   Ht 5\' 11"  (1.803 m)   Wt 260 lb (117.9 kg)   SpO2 98% Comment: 4ltrs O2 via nasal can  BMI 36.26 kg/m   Opioid Risk Score:   Fall Risk Score:  `1  Depression screen PHQ 2/9  No flowsheet data found.   Review of Systems  Constitutional: Negative.   HENT: Negative.   Eyes: Negative.   Respiratory: Positive for apnea and shortness of breath.   Cardiovascular: Positive for leg swelling.  Gastrointestinal: Positive for constipation.  Endocrine: Negative.   Genitourinary: Negative.   Musculoskeletal: Positive for arthralgias and myalgias.  Skin: Negative.   Allergic/Immunologic: Negative.   Neurological: Positive for weakness and numbness.  Hematological: Negative.   Psychiatric/Behavioral: Negative.   All other systems reviewed and are negative.      Objective:   Physical Exam  Awake, alert, appropriate, in manual w/c, wearing mask and O2 at 4L= was very SOB at 3L., NAD Tight- air movement B/L- no W/R/R RRR       Assessment & Plan:    1. Sees Pulmonary this afternoon- I suggest speaking   To them about possible underlying or chronic residual issues left from COVID and what work up needs to be done. Usually for Pulmonary, suggest a PFTs, pulmonary function test.  2. Hydrocodone-Guafeisin cough syrup 10 cc q6 hours as needed for severe cough- take normal robitussin if mild ot moderate cough, but take this if can't break it.  3. Needs refill for tesselon  pearls- were helpful- will refill  4. Taught how to use albuterol inhaler- went over exact way to take it, including wait 1 minute between puffs. Regular dose 2 puffs every 4 hours as needed- max DOSE 8 puffs every 2 hours-  Will increase heart rate, FYI.   5. F/U- 6 weeks   I spent a total of 25 minutes on appointment- more than 15 minutes going over plan as detailed above, esp how to use inhaler.

## 2019-01-07 NOTE — Patient Instructions (Signed)
1. Sees Pulmonary this afternoon- I suggest speaking  To them about possible underlying or chronic residual issues left from COVID and what work up needs to be done. Usually for Pulmonary, suggest a PFTs, pulmonary function test.  2. Hydrocodone-Guafeisin cough syrup 10 cc q6 hours as needed for severe cough- take normal robitussin if mild ot moderate cough, but take this if can't break it.  3. Needs refill for tesselon pearls- were helpful- were refill  4. Taught how to use albuterol inhaler- went over exact way to take it, including wait 1 minute between puffs. Regular dose 2 puffs every 4 hours as needed- max DOSE 8 puffs every 2 hours-  Will increase heart rate, FYI.   5. F/U- 6 weeks

## 2019-01-08 DIAGNOSIS — N183 Chronic kidney disease, stage 3 (moderate): Secondary | ICD-10-CM | POA: Diagnosis not present

## 2019-01-08 DIAGNOSIS — G7241 Inclusion body myositis [IBM]: Secondary | ICD-10-CM | POA: Diagnosis not present

## 2019-01-08 DIAGNOSIS — I130 Hypertensive heart and chronic kidney disease with heart failure and stage 1 through stage 4 chronic kidney disease, or unspecified chronic kidney disease: Secondary | ICD-10-CM | POA: Diagnosis not present

## 2019-01-08 DIAGNOSIS — E1122 Type 2 diabetes mellitus with diabetic chronic kidney disease: Secondary | ICD-10-CM | POA: Diagnosis not present

## 2019-01-08 DIAGNOSIS — I5032 Chronic diastolic (congestive) heart failure: Secondary | ICD-10-CM | POA: Diagnosis not present

## 2019-01-08 DIAGNOSIS — E1165 Type 2 diabetes mellitus with hyperglycemia: Secondary | ICD-10-CM | POA: Diagnosis not present

## 2019-01-08 DIAGNOSIS — N400 Enlarged prostate without lower urinary tract symptoms: Secondary | ICD-10-CM | POA: Diagnosis not present

## 2019-01-08 DIAGNOSIS — E1140 Type 2 diabetes mellitus with diabetic neuropathy, unspecified: Secondary | ICD-10-CM | POA: Diagnosis not present

## 2019-01-08 DIAGNOSIS — U071 COVID-19: Secondary | ICD-10-CM | POA: Diagnosis not present

## 2019-01-09 ENCOUNTER — Telehealth: Payer: Self-pay | Admitting: *Deleted

## 2019-01-09 NOTE — Telephone Encounter (Signed)
Pharmacy sent a fax drug request change for the hydrocodone 2.5-200mg /35ml listed as [place].  When I spoke to the pharmacist she said generic did not come in that strength only the name brand. BUT she said the patient was aware and said that he did not need it.  They have just put the Rx on hold. No further action needed unless the patient calls and requests it.

## 2019-01-13 DIAGNOSIS — E1122 Type 2 diabetes mellitus with diabetic chronic kidney disease: Secondary | ICD-10-CM | POA: Diagnosis not present

## 2019-01-13 DIAGNOSIS — G7241 Inclusion body myositis [IBM]: Secondary | ICD-10-CM | POA: Diagnosis not present

## 2019-01-13 DIAGNOSIS — E1140 Type 2 diabetes mellitus with diabetic neuropathy, unspecified: Secondary | ICD-10-CM | POA: Diagnosis not present

## 2019-01-13 DIAGNOSIS — N400 Enlarged prostate without lower urinary tract symptoms: Secondary | ICD-10-CM | POA: Diagnosis not present

## 2019-01-13 DIAGNOSIS — I130 Hypertensive heart and chronic kidney disease with heart failure and stage 1 through stage 4 chronic kidney disease, or unspecified chronic kidney disease: Secondary | ICD-10-CM | POA: Diagnosis not present

## 2019-01-13 DIAGNOSIS — N183 Chronic kidney disease, stage 3 (moderate): Secondary | ICD-10-CM | POA: Diagnosis not present

## 2019-01-13 DIAGNOSIS — U071 COVID-19: Secondary | ICD-10-CM | POA: Diagnosis not present

## 2019-01-13 DIAGNOSIS — E1165 Type 2 diabetes mellitus with hyperglycemia: Secondary | ICD-10-CM | POA: Diagnosis not present

## 2019-01-13 DIAGNOSIS — I5032 Chronic diastolic (congestive) heart failure: Secondary | ICD-10-CM | POA: Diagnosis not present

## 2019-01-14 DIAGNOSIS — N400 Enlarged prostate without lower urinary tract symptoms: Secondary | ICD-10-CM | POA: Diagnosis not present

## 2019-01-14 DIAGNOSIS — I130 Hypertensive heart and chronic kidney disease with heart failure and stage 1 through stage 4 chronic kidney disease, or unspecified chronic kidney disease: Secondary | ICD-10-CM | POA: Diagnosis not present

## 2019-01-14 DIAGNOSIS — U071 COVID-19: Secondary | ICD-10-CM | POA: Diagnosis not present

## 2019-01-14 DIAGNOSIS — E1165 Type 2 diabetes mellitus with hyperglycemia: Secondary | ICD-10-CM | POA: Diagnosis not present

## 2019-01-14 DIAGNOSIS — G7241 Inclusion body myositis [IBM]: Secondary | ICD-10-CM | POA: Diagnosis not present

## 2019-01-14 DIAGNOSIS — E1140 Type 2 diabetes mellitus with diabetic neuropathy, unspecified: Secondary | ICD-10-CM | POA: Diagnosis not present

## 2019-01-14 DIAGNOSIS — E1122 Type 2 diabetes mellitus with diabetic chronic kidney disease: Secondary | ICD-10-CM | POA: Diagnosis not present

## 2019-01-14 DIAGNOSIS — I5032 Chronic diastolic (congestive) heart failure: Secondary | ICD-10-CM | POA: Diagnosis not present

## 2019-01-14 DIAGNOSIS — N183 Chronic kidney disease, stage 3 (moderate): Secondary | ICD-10-CM | POA: Diagnosis not present

## 2019-01-15 DIAGNOSIS — G7241 Inclusion body myositis [IBM]: Secondary | ICD-10-CM | POA: Diagnosis not present

## 2019-01-15 DIAGNOSIS — E1140 Type 2 diabetes mellitus with diabetic neuropathy, unspecified: Secondary | ICD-10-CM | POA: Diagnosis not present

## 2019-01-15 DIAGNOSIS — I130 Hypertensive heart and chronic kidney disease with heart failure and stage 1 through stage 4 chronic kidney disease, or unspecified chronic kidney disease: Secondary | ICD-10-CM | POA: Diagnosis not present

## 2019-01-15 DIAGNOSIS — E1122 Type 2 diabetes mellitus with diabetic chronic kidney disease: Secondary | ICD-10-CM | POA: Diagnosis not present

## 2019-01-15 DIAGNOSIS — N183 Chronic kidney disease, stage 3 (moderate): Secondary | ICD-10-CM | POA: Diagnosis not present

## 2019-01-15 DIAGNOSIS — N400 Enlarged prostate without lower urinary tract symptoms: Secondary | ICD-10-CM | POA: Diagnosis not present

## 2019-01-15 DIAGNOSIS — U071 COVID-19: Secondary | ICD-10-CM | POA: Diagnosis not present

## 2019-01-15 DIAGNOSIS — E1165 Type 2 diabetes mellitus with hyperglycemia: Secondary | ICD-10-CM | POA: Diagnosis not present

## 2019-01-15 DIAGNOSIS — I5032 Chronic diastolic (congestive) heart failure: Secondary | ICD-10-CM | POA: Diagnosis not present

## 2019-01-16 ENCOUNTER — Ambulatory Visit (INDEPENDENT_AMBULATORY_CARE_PROVIDER_SITE_OTHER)
Admission: RE | Admit: 2019-01-16 | Discharge: 2019-01-16 | Disposition: A | Discharge status: Home / Self Care | Payer: Medicare HMO | Source: Ambulatory Visit | Attending: Pulmonary Disease | Admitting: Pulmonary Disease

## 2019-01-16 ENCOUNTER — Other Ambulatory Visit: Payer: Self-pay

## 2019-01-16 DIAGNOSIS — Z8616 Personal history of COVID-19: Secondary | ICD-10-CM

## 2019-01-16 DIAGNOSIS — R5381 Other malaise: Secondary | ICD-10-CM | POA: Diagnosis not present

## 2019-01-16 DIAGNOSIS — Z8619 Personal history of other infectious and parasitic diseases: Secondary | ICD-10-CM

## 2019-01-16 DIAGNOSIS — R0902 Hypoxemia: Secondary | ICD-10-CM | POA: Diagnosis not present

## 2019-01-16 DIAGNOSIS — J9611 Chronic respiratory failure with hypoxia: Secondary | ICD-10-CM

## 2019-01-17 DIAGNOSIS — R5381 Other malaise: Secondary | ICD-10-CM | POA: Diagnosis not present

## 2019-01-17 DIAGNOSIS — U071 COVID-19: Secondary | ICD-10-CM | POA: Diagnosis not present

## 2019-01-19 DIAGNOSIS — I130 Hypertensive heart and chronic kidney disease with heart failure and stage 1 through stage 4 chronic kidney disease, or unspecified chronic kidney disease: Secondary | ICD-10-CM | POA: Diagnosis not present

## 2019-01-19 DIAGNOSIS — G7241 Inclusion body myositis [IBM]: Secondary | ICD-10-CM | POA: Diagnosis not present

## 2019-01-19 DIAGNOSIS — I5032 Chronic diastolic (congestive) heart failure: Secondary | ICD-10-CM | POA: Diagnosis not present

## 2019-01-19 DIAGNOSIS — E1122 Type 2 diabetes mellitus with diabetic chronic kidney disease: Secondary | ICD-10-CM | POA: Diagnosis not present

## 2019-01-19 DIAGNOSIS — E1165 Type 2 diabetes mellitus with hyperglycemia: Secondary | ICD-10-CM | POA: Diagnosis not present

## 2019-01-19 DIAGNOSIS — N400 Enlarged prostate without lower urinary tract symptoms: Secondary | ICD-10-CM | POA: Diagnosis not present

## 2019-01-19 DIAGNOSIS — N183 Chronic kidney disease, stage 3 unspecified: Secondary | ICD-10-CM | POA: Diagnosis not present

## 2019-01-19 DIAGNOSIS — E1140 Type 2 diabetes mellitus with diabetic neuropathy, unspecified: Secondary | ICD-10-CM | POA: Diagnosis not present

## 2019-01-19 DIAGNOSIS — U071 COVID-19: Secondary | ICD-10-CM | POA: Diagnosis not present

## 2019-01-21 DIAGNOSIS — I5032 Chronic diastolic (congestive) heart failure: Secondary | ICD-10-CM | POA: Diagnosis not present

## 2019-01-21 DIAGNOSIS — I130 Hypertensive heart and chronic kidney disease with heart failure and stage 1 through stage 4 chronic kidney disease, or unspecified chronic kidney disease: Secondary | ICD-10-CM | POA: Diagnosis not present

## 2019-01-21 DIAGNOSIS — N400 Enlarged prostate without lower urinary tract symptoms: Secondary | ICD-10-CM | POA: Diagnosis not present

## 2019-01-21 DIAGNOSIS — N183 Chronic kidney disease, stage 3 unspecified: Secondary | ICD-10-CM | POA: Diagnosis not present

## 2019-01-21 DIAGNOSIS — U071 COVID-19: Secondary | ICD-10-CM | POA: Diagnosis not present

## 2019-01-21 DIAGNOSIS — E1165 Type 2 diabetes mellitus with hyperglycemia: Secondary | ICD-10-CM | POA: Diagnosis not present

## 2019-01-21 DIAGNOSIS — E1140 Type 2 diabetes mellitus with diabetic neuropathy, unspecified: Secondary | ICD-10-CM | POA: Diagnosis not present

## 2019-01-21 DIAGNOSIS — G7241 Inclusion body myositis [IBM]: Secondary | ICD-10-CM | POA: Diagnosis not present

## 2019-01-21 DIAGNOSIS — E1122 Type 2 diabetes mellitus with diabetic chronic kidney disease: Secondary | ICD-10-CM | POA: Diagnosis not present

## 2019-01-23 DIAGNOSIS — G7241 Inclusion body myositis [IBM]: Secondary | ICD-10-CM | POA: Diagnosis not present

## 2019-01-23 DIAGNOSIS — E1165 Type 2 diabetes mellitus with hyperglycemia: Secondary | ICD-10-CM | POA: Diagnosis not present

## 2019-01-23 DIAGNOSIS — E1140 Type 2 diabetes mellitus with diabetic neuropathy, unspecified: Secondary | ICD-10-CM | POA: Diagnosis not present

## 2019-01-23 DIAGNOSIS — I130 Hypertensive heart and chronic kidney disease with heart failure and stage 1 through stage 4 chronic kidney disease, or unspecified chronic kidney disease: Secondary | ICD-10-CM | POA: Diagnosis not present

## 2019-01-23 DIAGNOSIS — E1122 Type 2 diabetes mellitus with diabetic chronic kidney disease: Secondary | ICD-10-CM | POA: Diagnosis not present

## 2019-01-23 DIAGNOSIS — N400 Enlarged prostate without lower urinary tract symptoms: Secondary | ICD-10-CM | POA: Diagnosis not present

## 2019-01-23 DIAGNOSIS — U071 COVID-19: Secondary | ICD-10-CM | POA: Diagnosis not present

## 2019-01-23 DIAGNOSIS — N183 Chronic kidney disease, stage 3 unspecified: Secondary | ICD-10-CM | POA: Diagnosis not present

## 2019-01-23 DIAGNOSIS — I5032 Chronic diastolic (congestive) heart failure: Secondary | ICD-10-CM | POA: Diagnosis not present

## 2019-01-27 DIAGNOSIS — N183 Chronic kidney disease, stage 3 unspecified: Secondary | ICD-10-CM | POA: Diagnosis not present

## 2019-01-27 DIAGNOSIS — I130 Hypertensive heart and chronic kidney disease with heart failure and stage 1 through stage 4 chronic kidney disease, or unspecified chronic kidney disease: Secondary | ICD-10-CM | POA: Diagnosis not present

## 2019-01-27 DIAGNOSIS — G7241 Inclusion body myositis [IBM]: Secondary | ICD-10-CM | POA: Diagnosis not present

## 2019-01-27 DIAGNOSIS — H2513 Age-related nuclear cataract, bilateral: Secondary | ICD-10-CM | POA: Diagnosis not present

## 2019-01-27 DIAGNOSIS — U071 COVID-19: Secondary | ICD-10-CM | POA: Diagnosis not present

## 2019-01-27 DIAGNOSIS — N400 Enlarged prostate without lower urinary tract symptoms: Secondary | ICD-10-CM | POA: Diagnosis not present

## 2019-01-27 DIAGNOSIS — E1165 Type 2 diabetes mellitus with hyperglycemia: Secondary | ICD-10-CM | POA: Diagnosis not present

## 2019-01-27 DIAGNOSIS — I5032 Chronic diastolic (congestive) heart failure: Secondary | ICD-10-CM | POA: Diagnosis not present

## 2019-01-27 DIAGNOSIS — H401131 Primary open-angle glaucoma, bilateral, mild stage: Secondary | ICD-10-CM | POA: Diagnosis not present

## 2019-01-27 DIAGNOSIS — E1122 Type 2 diabetes mellitus with diabetic chronic kidney disease: Secondary | ICD-10-CM | POA: Diagnosis not present

## 2019-01-27 DIAGNOSIS — E1140 Type 2 diabetes mellitus with diabetic neuropathy, unspecified: Secondary | ICD-10-CM | POA: Diagnosis not present

## 2019-01-27 DIAGNOSIS — H524 Presbyopia: Secondary | ICD-10-CM | POA: Diagnosis not present

## 2019-01-28 DIAGNOSIS — N183 Chronic kidney disease, stage 3 unspecified: Secondary | ICD-10-CM | POA: Diagnosis not present

## 2019-01-28 DIAGNOSIS — I130 Hypertensive heart and chronic kidney disease with heart failure and stage 1 through stage 4 chronic kidney disease, or unspecified chronic kidney disease: Secondary | ICD-10-CM | POA: Diagnosis not present

## 2019-01-28 DIAGNOSIS — E1165 Type 2 diabetes mellitus with hyperglycemia: Secondary | ICD-10-CM | POA: Diagnosis not present

## 2019-01-28 DIAGNOSIS — G7241 Inclusion body myositis [IBM]: Secondary | ICD-10-CM | POA: Diagnosis not present

## 2019-01-28 DIAGNOSIS — E1122 Type 2 diabetes mellitus with diabetic chronic kidney disease: Secondary | ICD-10-CM | POA: Diagnosis not present

## 2019-01-28 DIAGNOSIS — U071 COVID-19: Secondary | ICD-10-CM | POA: Diagnosis not present

## 2019-01-28 DIAGNOSIS — E1140 Type 2 diabetes mellitus with diabetic neuropathy, unspecified: Secondary | ICD-10-CM | POA: Diagnosis not present

## 2019-01-28 DIAGNOSIS — I5032 Chronic diastolic (congestive) heart failure: Secondary | ICD-10-CM | POA: Diagnosis not present

## 2019-01-28 DIAGNOSIS — N400 Enlarged prostate without lower urinary tract symptoms: Secondary | ICD-10-CM | POA: Diagnosis not present

## 2019-01-29 ENCOUNTER — Other Ambulatory Visit: Payer: Self-pay | Admitting: *Deleted

## 2019-01-29 DIAGNOSIS — Z8616 Personal history of COVID-19: Secondary | ICD-10-CM

## 2019-01-29 DIAGNOSIS — Z8619 Personal history of other infectious and parasitic diseases: Secondary | ICD-10-CM

## 2019-01-29 DIAGNOSIS — N400 Enlarged prostate without lower urinary tract symptoms: Secondary | ICD-10-CM | POA: Diagnosis not present

## 2019-01-29 DIAGNOSIS — U071 COVID-19: Secondary | ICD-10-CM | POA: Diagnosis not present

## 2019-01-29 DIAGNOSIS — G7241 Inclusion body myositis [IBM]: Secondary | ICD-10-CM | POA: Diagnosis not present

## 2019-01-29 DIAGNOSIS — E1140 Type 2 diabetes mellitus with diabetic neuropathy, unspecified: Secondary | ICD-10-CM | POA: Diagnosis not present

## 2019-01-29 DIAGNOSIS — E1165 Type 2 diabetes mellitus with hyperglycemia: Secondary | ICD-10-CM | POA: Diagnosis not present

## 2019-01-29 DIAGNOSIS — I5032 Chronic diastolic (congestive) heart failure: Secondary | ICD-10-CM | POA: Diagnosis not present

## 2019-01-29 DIAGNOSIS — N183 Chronic kidney disease, stage 3 unspecified: Secondary | ICD-10-CM | POA: Diagnosis not present

## 2019-01-29 DIAGNOSIS — R0902 Hypoxemia: Secondary | ICD-10-CM

## 2019-01-29 DIAGNOSIS — E1122 Type 2 diabetes mellitus with diabetic chronic kidney disease: Secondary | ICD-10-CM | POA: Diagnosis not present

## 2019-01-29 DIAGNOSIS — I130 Hypertensive heart and chronic kidney disease with heart failure and stage 1 through stage 4 chronic kidney disease, or unspecified chronic kidney disease: Secondary | ICD-10-CM | POA: Diagnosis not present

## 2019-01-29 NOTE — Progress Notes (Signed)
Pt and daughter made aware of results of CT scan. HRCTwith ILD protocol order placed. Nothing further needed.

## 2019-01-29 NOTE — Progress Notes (Signed)
CT ordered. 

## 2019-02-03 ENCOUNTER — Telehealth: Payer: Self-pay | Admitting: Neurology

## 2019-02-03 DIAGNOSIS — U071 COVID-19: Secondary | ICD-10-CM | POA: Diagnosis not present

## 2019-02-03 DIAGNOSIS — Z9989 Dependence on other enabling machines and devices: Secondary | ICD-10-CM

## 2019-02-03 DIAGNOSIS — G4733 Obstructive sleep apnea (adult) (pediatric): Secondary | ICD-10-CM

## 2019-02-03 DIAGNOSIS — N183 Chronic kidney disease, stage 3 unspecified: Secondary | ICD-10-CM | POA: Diagnosis not present

## 2019-02-03 DIAGNOSIS — E1122 Type 2 diabetes mellitus with diabetic chronic kidney disease: Secondary | ICD-10-CM | POA: Diagnosis not present

## 2019-02-03 DIAGNOSIS — I130 Hypertensive heart and chronic kidney disease with heart failure and stage 1 through stage 4 chronic kidney disease, or unspecified chronic kidney disease: Secondary | ICD-10-CM | POA: Diagnosis not present

## 2019-02-03 DIAGNOSIS — Z125 Encounter for screening for malignant neoplasm of prostate: Secondary | ICD-10-CM | POA: Diagnosis not present

## 2019-02-03 DIAGNOSIS — I5032 Chronic diastolic (congestive) heart failure: Secondary | ICD-10-CM | POA: Diagnosis not present

## 2019-02-03 DIAGNOSIS — E1140 Type 2 diabetes mellitus with diabetic neuropathy, unspecified: Secondary | ICD-10-CM | POA: Diagnosis not present

## 2019-02-03 DIAGNOSIS — E1049 Type 1 diabetes mellitus with other diabetic neurological complication: Secondary | ICD-10-CM | POA: Diagnosis not present

## 2019-02-03 DIAGNOSIS — E7849 Other hyperlipidemia: Secondary | ICD-10-CM | POA: Diagnosis not present

## 2019-02-03 DIAGNOSIS — E1165 Type 2 diabetes mellitus with hyperglycemia: Secondary | ICD-10-CM | POA: Diagnosis not present

## 2019-02-03 DIAGNOSIS — G7241 Inclusion body myositis [IBM]: Secondary | ICD-10-CM | POA: Diagnosis not present

## 2019-02-03 DIAGNOSIS — N400 Enlarged prostate without lower urinary tract symptoms: Secondary | ICD-10-CM | POA: Diagnosis not present

## 2019-02-03 NOTE — Telephone Encounter (Signed)
I am sorry to hear that he was sick and had to be hospitalized. I reviewed patient's CPAP compliance data for the past 30 days, during which time he used his CPAP machine 7 days, percent use days greater than 4 hours was 10% only, average usage of 3 hours and 36 minutes.  Residual AHI 2.1/h.  Pressure is currently at 14, we can certainly turn down the pressure to 11 cm as requested and reevaluate his compliance on the lower pressure down the road. Please let him know That we will request a pressure reduction through [facility].

## 2019-02-03 NOTE — Telephone Encounter (Signed)
Pt called in and stated he has been in the hospital for 49mo with COVID and he is home now on oxygen , and he is stating the face mask for his cpap machine he would like it to be turned down to 11. Pt is requesting a call back to discuss

## 2019-02-03 NOTE — Telephone Encounter (Signed)
I called pt and discussed this with him. He is agreeable to a cpap pressure reduction and will keep his Nov appt. I have sent the order to [facility]. Pt verbalized understanding of recommendations. Pt had no questions at this time but was encouraged to call back if questions arise.

## 2019-02-04 DIAGNOSIS — I130 Hypertensive heart and chronic kidney disease with heart failure and stage 1 through stage 4 chronic kidney disease, or unspecified chronic kidney disease: Secondary | ICD-10-CM | POA: Diagnosis not present

## 2019-02-04 DIAGNOSIS — E1122 Type 2 diabetes mellitus with diabetic chronic kidney disease: Secondary | ICD-10-CM | POA: Diagnosis not present

## 2019-02-04 DIAGNOSIS — G7241 Inclusion body myositis [IBM]: Secondary | ICD-10-CM | POA: Diagnosis not present

## 2019-02-04 DIAGNOSIS — N183 Chronic kidney disease, stage 3 unspecified: Secondary | ICD-10-CM | POA: Diagnosis not present

## 2019-02-04 DIAGNOSIS — U071 COVID-19: Secondary | ICD-10-CM | POA: Diagnosis not present

## 2019-02-04 DIAGNOSIS — E1140 Type 2 diabetes mellitus with diabetic neuropathy, unspecified: Secondary | ICD-10-CM | POA: Diagnosis not present

## 2019-02-04 DIAGNOSIS — I5032 Chronic diastolic (congestive) heart failure: Secondary | ICD-10-CM | POA: Diagnosis not present

## 2019-02-04 DIAGNOSIS — N400 Enlarged prostate without lower urinary tract symptoms: Secondary | ICD-10-CM | POA: Diagnosis not present

## 2019-02-04 DIAGNOSIS — E1165 Type 2 diabetes mellitus with hyperglycemia: Secondary | ICD-10-CM | POA: Diagnosis not present

## 2019-02-06 DIAGNOSIS — I5032 Chronic diastolic (congestive) heart failure: Secondary | ICD-10-CM | POA: Diagnosis not present

## 2019-02-06 DIAGNOSIS — I130 Hypertensive heart and chronic kidney disease with heart failure and stage 1 through stage 4 chronic kidney disease, or unspecified chronic kidney disease: Secondary | ICD-10-CM | POA: Diagnosis not present

## 2019-02-06 DIAGNOSIS — E1122 Type 2 diabetes mellitus with diabetic chronic kidney disease: Secondary | ICD-10-CM | POA: Diagnosis not present

## 2019-02-06 DIAGNOSIS — E1165 Type 2 diabetes mellitus with hyperglycemia: Secondary | ICD-10-CM | POA: Diagnosis not present

## 2019-02-06 DIAGNOSIS — U071 COVID-19: Secondary | ICD-10-CM | POA: Diagnosis not present

## 2019-02-06 DIAGNOSIS — D692 Other nonthrombocytopenic purpura: Secondary | ICD-10-CM | POA: Diagnosis not present

## 2019-02-06 DIAGNOSIS — E1140 Type 2 diabetes mellitus with diabetic neuropathy, unspecified: Secondary | ICD-10-CM | POA: Diagnosis not present

## 2019-02-06 DIAGNOSIS — Z0000 Encounter for general adult medical examination without abnormal findings: Secondary | ICD-10-CM | POA: Diagnosis not present

## 2019-02-06 DIAGNOSIS — N400 Enlarged prostate without lower urinary tract symptoms: Secondary | ICD-10-CM | POA: Diagnosis not present

## 2019-02-06 DIAGNOSIS — Z1331 Encounter for screening for depression: Secondary | ICD-10-CM | POA: Diagnosis not present

## 2019-02-06 DIAGNOSIS — J9611 Chronic respiratory failure with hypoxia: Secondary | ICD-10-CM | POA: Diagnosis not present

## 2019-02-06 DIAGNOSIS — N183 Chronic kidney disease, stage 3 unspecified: Secondary | ICD-10-CM | POA: Diagnosis not present

## 2019-02-06 DIAGNOSIS — G7241 Inclusion body myositis [IBM]: Secondary | ICD-10-CM | POA: Diagnosis not present

## 2019-02-06 DIAGNOSIS — E785 Hyperlipidemia, unspecified: Secondary | ICD-10-CM | POA: Diagnosis not present

## 2019-02-06 DIAGNOSIS — Z794 Long term (current) use of insulin: Secondary | ICD-10-CM | POA: Diagnosis not present

## 2019-02-06 DIAGNOSIS — Z9981 Dependence on supplemental oxygen: Secondary | ICD-10-CM | POA: Diagnosis not present

## 2019-02-07 DIAGNOSIS — U071 COVID-19: Secondary | ICD-10-CM | POA: Diagnosis not present

## 2019-02-10 DIAGNOSIS — E1122 Type 2 diabetes mellitus with diabetic chronic kidney disease: Secondary | ICD-10-CM | POA: Diagnosis not present

## 2019-02-10 DIAGNOSIS — E1140 Type 2 diabetes mellitus with diabetic neuropathy, unspecified: Secondary | ICD-10-CM | POA: Diagnosis not present

## 2019-02-10 DIAGNOSIS — N400 Enlarged prostate without lower urinary tract symptoms: Secondary | ICD-10-CM | POA: Diagnosis not present

## 2019-02-10 DIAGNOSIS — E1165 Type 2 diabetes mellitus with hyperglycemia: Secondary | ICD-10-CM | POA: Diagnosis not present

## 2019-02-10 DIAGNOSIS — I130 Hypertensive heart and chronic kidney disease with heart failure and stage 1 through stage 4 chronic kidney disease, or unspecified chronic kidney disease: Secondary | ICD-10-CM | POA: Diagnosis not present

## 2019-02-10 DIAGNOSIS — N183 Chronic kidney disease, stage 3 unspecified: Secondary | ICD-10-CM | POA: Diagnosis not present

## 2019-02-10 DIAGNOSIS — I5032 Chronic diastolic (congestive) heart failure: Secondary | ICD-10-CM | POA: Diagnosis not present

## 2019-02-10 DIAGNOSIS — U071 COVID-19: Secondary | ICD-10-CM | POA: Diagnosis not present

## 2019-02-10 DIAGNOSIS — G7241 Inclusion body myositis [IBM]: Secondary | ICD-10-CM | POA: Diagnosis not present

## 2019-02-15 DIAGNOSIS — R5381 Other malaise: Secondary | ICD-10-CM | POA: Diagnosis not present

## 2019-02-16 DIAGNOSIS — R5381 Other malaise: Secondary | ICD-10-CM | POA: Diagnosis not present

## 2019-02-16 DIAGNOSIS — U071 COVID-19: Secondary | ICD-10-CM | POA: Diagnosis not present

## 2019-02-19 ENCOUNTER — Encounter: Payer: Medicare HMO | Admitting: Physical Medicine and Rehabilitation

## 2019-03-05 DIAGNOSIS — Z23 Encounter for immunization: Secondary | ICD-10-CM | POA: Diagnosis not present

## 2019-03-10 DIAGNOSIS — U071 COVID-19: Secondary | ICD-10-CM | POA: Diagnosis not present

## 2019-03-11 ENCOUNTER — Encounter: Payer: Medicare HMO | Admitting: Physical Medicine and Rehabilitation

## 2019-03-11 DIAGNOSIS — G603 Idiopathic progressive neuropathy: Secondary | ICD-10-CM | POA: Diagnosis not present

## 2019-03-11 DIAGNOSIS — R4189 Other symptoms and signs involving cognitive functions and awareness: Secondary | ICD-10-CM | POA: Diagnosis not present

## 2019-03-11 DIAGNOSIS — G7241 Inclusion body myositis [IBM]: Secondary | ICD-10-CM | POA: Diagnosis not present

## 2019-03-16 ENCOUNTER — Encounter: Payer: Medicare HMO | Admitting: Physical Medicine and Rehabilitation

## 2019-03-17 ENCOUNTER — Encounter: Payer: Self-pay | Admitting: Neurology

## 2019-03-18 DIAGNOSIS — R5381 Other malaise: Secondary | ICD-10-CM | POA: Diagnosis not present

## 2019-03-19 ENCOUNTER — Ambulatory Visit (INDEPENDENT_AMBULATORY_CARE_PROVIDER_SITE_OTHER): Payer: Medicare HMO | Admitting: Adult Health

## 2019-03-19 ENCOUNTER — Encounter: Payer: Self-pay | Admitting: Adult Health

## 2019-03-19 ENCOUNTER — Other Ambulatory Visit: Payer: Self-pay

## 2019-03-19 VITALS — BP 137/78 | HR 80 | Temp 98.5°F | Ht 71.0 in | Wt 263.0 lb

## 2019-03-19 DIAGNOSIS — G4733 Obstructive sleep apnea (adult) (pediatric): Secondary | ICD-10-CM

## 2019-03-19 DIAGNOSIS — Z9989 Dependence on other enabling machines and devices: Secondary | ICD-10-CM | POA: Diagnosis not present

## 2019-03-19 DIAGNOSIS — R5381 Other malaise: Secondary | ICD-10-CM | POA: Diagnosis not present

## 2019-03-19 DIAGNOSIS — U071 COVID-19: Secondary | ICD-10-CM | POA: Diagnosis not present

## 2019-03-19 NOTE — Progress Notes (Addendum)
PATIENT: Andrew Larsen DOB: November 27, 1940  REASON FOR VISIT: follow up HISTORY FROM: patient  HISTORY OF PRESENT ILLNESS: Today 03/19/19:  Andrew Larsen is a 78 year old male with a history of obstructive sleep apnea on CPAP.  He returns today for follow-up.  His download indicates that he use his machine 30 out of 30 days for compliance of 100%.  He uses machine greater than 4 hours for compliance of 90%.  On average he uses his machine 5 hours and 32 minutes each night.  His residual AHI is 3.8 on 11 cm of water.  He reports that the CPAP is working well.  He was hospitalized in July for COVID-19.  He was on supplemental oxygen 24/7 but reports that he is slowly wean off of this.  He returns today for an evaluation.  HISTORY (copied from Dr. Guadelupe Sabin note) I reviewed his CPAP compliance data from 08/16/2018 through 09/14/2018 which is a total of 30 days, during which time he used his machine every night with percent use days greater than 4 hours at 83.3%, indicating very good compliance with an average usage of 5 hours and 15 minutes, residual AHI suboptimal but improved from before at 10.1/h, leak on the low side, pressure at 13 cm.  The patient's allergies, current medications, family history, past medical history, past social history, past surgical history and problem list were reviewed and updated as appropriate.  REVIEW OF SYSTEMS: Out of a complete 14 system review of symptoms, the patient complains only of the following symptoms, and all other reviewed systems are negative.  ALLERGIES: Allergies  Allergen Reactions  . Metformin And Related   . Simbrinza [Brinzolamide-Brimonidine]     Drainage, redness to eyes    HOME MEDICATIONS: Outpatient Medications Prior to Visit  Medication Sig Dispense Refill  . albuterol (VENTOLIN HFA) 108 (90 Base) MCG/ACT inhaler Inhale 2 puffs into the lungs every 4 (four) hours as needed for wheezing or shortness of breath. 18 g 5  . ALPRAZolam  (XANAX) 0.5 MG tablet TK 1 T PO HS    . aspirin 81 MG chewable tablet Chew 81 mg by mouth at bedtime.     . benzonatate (TESSALON) 200 MG capsule Take 1 capsule (200 mg total) by mouth 3 (three) times daily as needed for cough. 90 capsule 1  . calcium carbonate (CALCIUM 600) 600 MG TABS tablet Take 2 tablets (1,200 mg total) by mouth daily. 60 tablet 0  . Cholecalciferol (VITAMIN D3) 5000 units TABS Take 10,000 Units by mouth daily.     . cilostazol (PLETAL) 100 MG tablet Take 1 tablet (100 mg total) by mouth 2 (two) times daily. 60 tablet 0  . Evolocumab (REPATHA) 140 MG/ML SOSY Inject 140 mg into the skin every 14 (fourteen) days.    . famotidine (PEPCID) 20 MG tablet Take 1 tablet (20 mg total) by mouth at bedtime. 30 tablet 0  . folic acid (FOLVITE) 1 MG tablet Take 1 tablet (1 mg total) by mouth daily. 30 tablet 0  . furosemide (LASIX) 40 MG tablet Take 1 tablet (40 mg total) by mouth daily. 30 tablet 0  . gabapentin (NEURONTIN) 300 MG capsule Take 2 capsules (600 mg total) by mouth 3 (three) times daily. 90 capsule 0  . insulin aspart protamine - aspart (NOVOLOG MIX 70/30 FLEXPEN) (70-30) 100 UNIT/ML FlexPen Inject 0.36 mLs (36 Units total) into the skin daily with breakfast. (Patient taking differently: Inject into the skin daily with breakfast. 60 units in  AM and 50 units at night) 15 mL 11  . latanoprost (XALATAN) 0.005 % ophthalmic solution Place 1 drop into both eyes at bedtime.    . magnesium gluconate (MAGONATE) 500 MG tablet Take 500 mg by mouth daily.    . metoprolol tartrate (LOPRESSOR) 25 MG tablet Take 1 tablet (25 mg total) by mouth 2 (two) times daily. 60 tablet 0  . Multiple Vitamin (MULTIVITAMIN) capsule Take 1 capsule by mouth daily.     Marland Kitchen oxybutynin (DITROPAN) 5 MG tablet Take 1 tablet (5 mg total) by mouth 2 (two) times daily. 60 tablet 0  . potassium chloride SA (K-DUR) 20 MEQ tablet Take 1 tablet (20 mEq total) by mouth daily. 30 tablet 0  . tamsulosin (FLOMAX) 0.4 MG  CAPS capsule Take 0.4 mg by mouth daily.    . timolol (TIMOPTIC) 0.5 % ophthalmic solution INT 1 GTT IN EACH EYE BID    . vitamin C (VITAMIN C) 500 MG tablet Take 1 tablet (500 mg total) by mouth daily. 30 tablet 0  . zinc sulfate 220 (50 Zn) MG capsule Take 1 capsule (220 mg total) by mouth daily. 30 capsule 0  . brimonidine-timolol (COMBIGAN) 0.2-0.5 % ophthalmic solution Place 1 drop into both eyes every 12 (twelve) hours.    . docusate sodium (COLACE) 100 MG capsule Take 1 capsule (100 mg total) by mouth 2 (two) times daily as needed for mild constipation or moderate constipation. 10 capsule 0  . HYDROcodone-guaiFENesin 2.5-200 MG/5ML SOLN Take 10 mLs by mouth every 6 (six) hours as needed (severe cough). 560 mL 0  . pantoprazole (PROTONIX) 40 MG tablet Take 1 tablet (40 mg total) by mouth daily. 30 tablet 0  . senna-docusate (SENOKOT-S) 8.6-50 MG tablet Take 2 tablets by mouth 2 (two) times daily.     No facility-administered medications prior to visit.     PAST MEDICAL HISTORY: Past Medical History:  Diagnosis Date  . CKD (chronic kidney disease)    CKD stage III (01/2018)  . Daytime somnolence   . Diabetes mellitus without complication ([place])   . Fracture    right fibula/wears boot cast  . GERD (gastroesophageal reflux disease)   . Glaucoma   . Hypercholesteremia   . Hyperlipidemia   . Hypertension   . Mold exposure   . PAD (peripheral artery disease) ([place])   . Sleep apnea     PAST SURGICAL HISTORY: Past Surgical History:  Procedure Laterality Date  . LOWER EXTREMITY ANGIOGRAPHY N/A 06/12/2016   Procedure: Lower Extremity Angiography;  Surgeon: Adrian Prows, MD;  Location: [place] [facility];  Service: Cardiovascular;  Laterality: N/A;  . MUSCLE BIOPSY Right 05/06/2018   Procedure: right vastus lateralis muscle biopsy;  Surgeon: Earnie Larsson, MD;  Location: [facility];  Service: Neurosurgery;  Laterality: Right;  . ORIF ANKLE FRACTURE Right 10/02/2012   Procedure: OPEN REDUCTION  INTERNAL FIXATION (ORIF) ANKLE FRACTURE;  Surgeon: Meredith Pel, MD;  Location[facility];  Service: Orthopedics;  Laterality: Right;  . PERIPHERAL VASCULAR ATHERECTOMY Left 06/12/2016   Procedure: Peripheral Vascular Atherectomy-Left Popliteal;  Surgeon: Adrian Prows, MD;  Location: [place] [facility];  Service: Cardiovascular;  Laterality: Left;  . PERIPHERAL VASCULAR INTERVENTION Left 06/12/2016   Procedure: Peripheral Vascular Intervention- DCB Left Popliteal;  Surgeon: Adrian Prows, MD;  Location: [facility];  Service: Cardiovascular;  Laterality: Left;    FAMILY HISTORY: Family History  Problem Relation Age of Onset  . Heart disease Father   . Prostate  cancer Brother   . Cancer Brother   . Diabetes Brother   . Colon cancer Neg Hx     SOCIAL HISTORY: Social History   Socioeconomic History  . Marital status: Married    Spouse name: Not on file  . Number of children: Not on file  . Years of education: Not on file  . Highest education level: Not on file  Occupational History  . Not on file  Social Needs  . Financial resource strain: Not on file  . Food insecurity    Worry: Not on file    Inability: Not on file  . Transportation needs    Medical: Not on file    Non-medical: Not on file  Tobacco Use  . Smoking status: Former Smoker    Packs/day: 1.00    Years: 20.00    Pack years: 20.00    Quit date: 08/22/1981    Years since quitting: 37.5  . Smokeless tobacco: Never Used  Substance and Sexual Activity  . Alcohol use: No    Alcohol/week: 0.0 standard drinks  . Drug use: No  . Sexual activity: Never  Lifestyle  . Physical activity    Days per week: Not on file    Minutes per session: Not on file  . Stress: Not on file  Relationships  . Social Herbalist on phone: Not on file    Gets together: Not on file    Attends religious service: Not on file    Active member of club or organization: Not on file    Attends meetings of clubs or organizations:  Not on file    Relationship status: Not on file  . Intimate partner violence    Fear of current or ex partner: Not on file    Emotionally abused: Not on file    Physically abused: Not on file    Forced sexual activity: Not on file  Other Topics Concern  . Not on file  Social History Narrative   ** Merged History Encounter **          PHYSICAL EXAM  Vitals:   03/19/19 1337  BP: 137/78  Pulse: 80  Temp: 98.5 F (36.9 C)  Weight: 263 lb (119.3 kg)  Height: 5\' 11"  (1.803 m)   Body mass index is 36.68 kg/m.  Generalized: Well developed, in no acute distress  Chest: Lungs clear to auscultation bilaterally  Neurological examination  Mentation: Alert oriented to time, place, history taking. Follows all commands speech and language fluent Cranial nerve II-XII: Extraocular movements were full, visual field were full on confrontational test Head turning and shoulder shrug  were normal and symmetric. Motor: The motor testing reveals 5 over 5 strength of all 4 extremities. Good symmetric motor tone is noted throughout.  Sensory: Sensory testing is intact to soft touch on all 4 extremities. No evidence of extinction is noted.  Gait and station: Gait is normal.    DIAGNOSTIC DATA (LABS, IMAGING, TESTING) - I reviewed patient records, labs, notes, testing and imaging myself where available.  Lab Results  Component Value Date   WBC 4.9 12/15/2018   HGB 13.8 12/15/2018   HCT 41.1 12/15/2018   MCV 94.1 12/15/2018   PLT 226 12/15/2018      Component Value Date/Time   NA 135 12/15/2018 0816   K 4.1 12/15/2018 0816   CL 97 (L) 12/15/2018 0816   CO2 27 12/15/2018 0816   GLUCOSE 247 (H) 12/15/2018 0816   BUN  27 (H) 12/15/2018 0816   CREATININE 1.44 (H) 12/15/2018 0816   CALCIUM 9.1 12/15/2018 0816   PROT 5.1 (L) 12/12/2018 0850   ALBUMIN 3.0 (L) 12/12/2018 0850   AST 25 12/12/2018 0850   ALT 24 12/12/2018 0850   ALKPHOS 52 12/12/2018 0850   BILITOT 0.7 12/12/2018 0850    GFRNONAA 47 (L) 12/15/2018 0816   GFRAA 54 (L) 12/15/2018 0816   Lab Results  Component Value Date   CHOL 95 11/08/2018   HDL 35 (L) 11/08/2018   LDLCALC 39 11/08/2018   TRIG 104 11/08/2018   CHOLHDL 2.7 11/08/2018   Lab Results  Component Value Date   HGBA1C 7.3 (H) 11/08/2018   No results found for: VITAMINB12 No results found for: TSH    ASSESSMENT AND PLAN 78 y.o. year old male  has a past medical history of CKD (chronic kidney disease), Daytime somnolence, Diabetes mellitus without complication ([place]), Fracture, GERD (gastroesophageal reflux disease), Glaucoma, Hypercholesteremia, Hyperlipidemia, Hypertension, Mold exposure, PAD (peripheral artery disease) ([place]), and Sleep apnea. here with:  1. Obstructive sleep apnea on CPAP  The patient's CPAP download shows excellent compliance and good treatment of his apnea.  He is encouraged to continue using CPAP nightly and greater than 4 hours each night.  He is advised that if his symptoms worsen or he develops new symptoms he should let us know.  He will follow-up in 1 year or sooner if needed.    I spent 15 minutes with the patient. 50% of this time was spent reviewing CPAP download  Ward Givens, MSN, NP-C 03/19/2019, 1:43 PM [facility] [address] [place], [place] [address] [phone]  I reviewed the above note and documentation by the Nurse Practitioner and agree with the history, exam, assessment and plan as outlined above. I was available for consultation. Star Age, MD, PhD [facility])

## 2019-03-19 NOTE — Patient Instructions (Signed)
Continue using CPAP nightly and greater than 4 hours each night °If your symptoms worsen or you develop new symptoms please let us know.  ° °

## 2019-03-24 ENCOUNTER — Other Ambulatory Visit: Payer: Self-pay

## 2019-03-24 ENCOUNTER — Telehealth: Payer: Self-pay

## 2019-03-24 NOTE — Telephone Encounter (Signed)
Pt called and asked for Cilostazol to be filled and hasnt been seen since 2018 in allscripts

## 2019-03-24 NOTE — Telephone Encounter (Signed)
Then he will need to ask Dr. Ardeth Perfect or to make an appt with [place]

## 2019-03-25 ENCOUNTER — Encounter: Payer: Medicare HMO | Attending: Physical Medicine and Rehabilitation | Admitting: Physical Medicine and Rehabilitation

## 2019-03-25 ENCOUNTER — Other Ambulatory Visit: Payer: Self-pay

## 2019-03-25 ENCOUNTER — Other Ambulatory Visit: Payer: Self-pay | Admitting: Cardiology

## 2019-03-25 ENCOUNTER — Telehealth: Payer: Self-pay | Admitting: *Deleted

## 2019-03-25 ENCOUNTER — Encounter: Payer: Self-pay | Admitting: Physical Medicine and Rehabilitation

## 2019-03-25 VITALS — BP 151/78 | HR 75 | Temp 97.7°F | Ht 71.0 in | Wt 265.0 lb

## 2019-03-25 DIAGNOSIS — R0602 Shortness of breath: Secondary | ICD-10-CM | POA: Diagnosis not present

## 2019-03-25 DIAGNOSIS — R5381 Other malaise: Secondary | ICD-10-CM | POA: Diagnosis not present

## 2019-03-25 DIAGNOSIS — U071 COVID-19: Secondary | ICD-10-CM

## 2019-03-25 DIAGNOSIS — R05 Cough: Secondary | ICD-10-CM | POA: Diagnosis not present

## 2019-03-25 DIAGNOSIS — J988 Other specified respiratory disorders: Secondary | ICD-10-CM | POA: Diagnosis not present

## 2019-03-25 DIAGNOSIS — G7241 Inclusion body myositis [IBM]: Secondary | ICD-10-CM

## 2019-03-25 NOTE — Telephone Encounter (Addendum)
Per Dr Dagoberto Ligas, she is requesting we d/c O2 from [place]. Order faxed to [facility] @ [phone].

## 2019-03-25 NOTE — Telephone Encounter (Signed)
Left vm with details pt may call back

## 2019-03-25 NOTE — Patient Instructions (Signed)
Assessment & Plan:  Post COVID patient- inclusion body myositis at [facility]  1. Will write to d/c Oxygen- will ask Nursing to help me d/c H/H- agency- [facility].  2. Will wait on additional therapies is basically stable at current functional level.  3. Has a DME parking placard about to expire- wrote for permanent placard for 5+ years and doesn't need renewal- since inclusion body myositis.   4. F/U as needed

## 2019-03-25 NOTE — Progress Notes (Signed)
Subjective:    Patient ID: Andrew Larsen, male    DOB: [birth year], 78 y.o.   MRN: [id number]  HPI   CC: Post COVID  Thinks back to 75% back to normal- still a little SOB- has a tendency to need to take a deep breath now and then- not keeping him from functioning.  Still trouble sleeping- anxiety- Has had flashbacks from being in Hospital form COVID. Can go to sleep but wakes up ~ 3am- not from coughing, just wakes up- goes back to sleep "sometimes".  Goes to sleep initially around 11pm- sleeps 6-7 hours when Takes Xanax 0.5 mg - tries not to take every night- takes 2-3x/week.  Using CPAP regularly. Cough is much better- still having a little bit of that. Not taking Tesselon pearls- too expensive- still taking Robitussin OTC cough meds and cough drops OTC.   Rapatha 2x/month-    BGs running well- mornings <100- "too well"- and evenings 130s-140s-   Needs an order to d/c O2.  Hasn't heard from [facility] for weeks and knew OT was to stop- but never heard back about PT- doing OK however.   Used cane or RW prior to COVID- now on RW- a little paranoid- reluctant to try and venture off RW.   Pain Inventory Average Pain 6 Pain Right Now 6 My pain is aching  In the last 24 hours, has pain interfered with the following? General activity 5 Relation with others 5 Enjoyment of life 5 What TIME of day is your pain at its worst? morning Sleep (in general) Poor  Pain is worse with: walking Pain improves with: medication Relief from Meds: 9  Mobility walk with assistance use a walker ability to climb steps?  yes do you drive?  yes Do you have any goals in this area?  yes  Function retired  Neuro/Psych numbness tingling  Prior Studies Any changes since last visit?  no  Physicians involved in your care Any changes since last visit?  no   Family History  Problem Relation Age of Onset  . Heart disease Father   . Prostate cancer Brother   . Cancer Brother   .  Diabetes Brother   . Colon cancer Neg Hx    Social History   Socioeconomic History  . Marital status: Married    Spouse name: Not on file  . Number of children: Not on file  . Years of education: Not on file  . Highest education level: Not on file  Occupational History  . Not on file  Social Needs  . Financial resource strain: Not on file  . Food insecurity    Worry: Not on file    Inability: Not on file  . Transportation needs    Medical: Not on file    Non-medical: Not on file  Tobacco Use  . Smoking status: Former Smoker    Packs/day: 1.00    Years: 20.00    Pack years: 20.00    Quit date: 08/22/1981    Years since quitting: 37.6  . Smokeless tobacco: Never Used  Substance and Sexual Activity  . Alcohol use: No    Alcohol/week: 0.0 standard drinks  . Drug use: No  . Sexual activity: Never  Lifestyle  . Physical activity    Days per week: Not on file    Minutes per session: Not on file  . Stress: Not on file  Relationships  . Social connections    Talks on phone: Not on file  Gets together: Not on file    Attends religious service: Not on file    Active member of club or organization: Not on file    Attends meetings of clubs or organizations: Not on file    Relationship status: Not on file  Other Topics Concern  . Not on file  Social History Narrative   ** Merged History Encounter **       Past Surgical History:  Procedure Laterality Date  . LOWER EXTREMITY ANGIOGRAPHY N/A 06/12/2016   Procedure: Lower Extremity Angiography;  Surgeon: Adrian Prows, MD;  Location: [place] [facility];  Service: Cardiovascular;  Laterality: N/A;  . MUSCLE BIOPSY Right 05/06/2018   Procedure: right vastus lateralis muscle biopsy;  Surgeon: Earnie Larsson, MD;  Location: [place];  Service: Neurosurgery;  Laterality: Right;  . ORIF ANKLE FRACTURE Right 10/02/2012   Procedure: OPEN REDUCTION INTERNAL FIXATION (ORIF) ANKLE FRACTURE;  Surgeon: Meredith Pel, MD;  [facility];   Service: Orthopedics;  Laterality: Right;  . PERIPHERAL VASCULAR ATHERECTOMY Left 06/12/2016   Procedure: Peripheral Vascular Atherectomy-Left Popliteal;  Surgeon: Adrian Prows, MD;  Location: [facility];  Service: Cardiovascular;  Laterality: Left;  . PERIPHERAL VASCULAR INTERVENTION Left 06/12/2016   Procedure: Peripheral Vascular Intervention- DCB Left Popliteal;  Surgeon: Adrian Prows, MD;  Location: [place] [facility];  Service: Cardiovascular;  Laterality: Left;   Past Medical History:  Diagnosis Date  . CKD (chronic kidney disease)    CKD stage III (01/2018)  . Daytime somnolence   . Diabetes mellitus without complication ([place])   . Fracture    right fibula/wears boot cast  . GERD (gastroesophageal reflux disease)   . Glaucoma   . Hypercholesteremia   . Hyperlipidemia   . Hypertension   . Mold exposure   . PAD (peripheral artery disease) ([place])   . Sleep apnea    BP (!) 151/78   Pulse 75   Temp 97.7 F (36.5 C)   Ht 5\' 11"  (1.803 m)   Wt 265 lb (120.2 kg)   SpO2 96%   BMI 36.96 kg/m   Opioid Risk Score:   Fall Risk Score:  `1  Depression screen PHQ 2/9  No flowsheet data found.  Review of Systems  Constitutional: Negative.   HENT: Negative.   Eyes: Negative.   Respiratory: Positive for apnea and shortness of breath.   Cardiovascular: Positive for leg swelling.  Gastrointestinal: Positive for constipation.  Endocrine: Negative.   Genitourinary: Negative.   Musculoskeletal: Positive for gait problem and myalgias.  Skin: Negative.   Allergic/Immunologic: Negative.   Neurological: Positive for numbness.       Tingling   Psychiatric/Behavioral: Negative.   All other systems reviewed and are negative.      Objective:   Physical Exam  Awake,alert, appropriate, has RW and daughter in room, NAD RRR CTA B/L- slightly decreased on R base, but good air movement otherwise,  No wheezes, rales, or rhonchi Protuberant, soft, NT, ND, (+)BS Has RW and daughter in  room    Assessment & Plan:  Halfway patient- inclusion body myositis at [facility]  1. Will write to d/c Oxygen- will ask Nursing to help me d/c H/H- agency- [facility].  2. Will wait on additional therapies is basically stable at current functional level.  3. Has a DME parking placard about to expire- wrote for permanent placard for 5+ years and doesn't need renewal- since inclusion body myositis.   4. F/U as needed

## 2019-03-30 DIAGNOSIS — G4733 Obstructive sleep apnea (adult) (pediatric): Secondary | ICD-10-CM | POA: Diagnosis not present

## 2019-04-09 DIAGNOSIS — Z20828 Contact with and (suspected) exposure to other viral communicable diseases: Secondary | ICD-10-CM | POA: Diagnosis not present

## 2019-04-16 DIAGNOSIS — I739 Peripheral vascular disease, unspecified: Secondary | ICD-10-CM | POA: Diagnosis not present

## 2019-04-16 DIAGNOSIS — I10 Essential (primary) hypertension: Secondary | ICD-10-CM | POA: Diagnosis not present

## 2019-04-16 DIAGNOSIS — E1165 Type 2 diabetes mellitus with hyperglycemia: Secondary | ICD-10-CM | POA: Diagnosis not present

## 2019-04-16 DIAGNOSIS — E7800 Pure hypercholesterolemia, unspecified: Secondary | ICD-10-CM | POA: Diagnosis not present

## 2019-04-16 DIAGNOSIS — N189 Chronic kidney disease, unspecified: Secondary | ICD-10-CM | POA: Diagnosis not present

## 2019-04-17 DIAGNOSIS — R5381 Other malaise: Secondary | ICD-10-CM | POA: Diagnosis not present

## 2019-04-18 DIAGNOSIS — R5381 Other malaise: Secondary | ICD-10-CM | POA: Diagnosis not present

## 2019-04-18 DIAGNOSIS — U071 COVID-19: Secondary | ICD-10-CM | POA: Diagnosis not present

## 2019-05-26 ENCOUNTER — Ambulatory Visit: Payer: Medicare HMO

## 2019-06-08 ENCOUNTER — Ambulatory Visit: Payer: Self-pay | Admitting: Cardiology

## 2019-06-11 ENCOUNTER — Encounter: Payer: Self-pay | Admitting: Cardiology

## 2019-06-11 ENCOUNTER — Other Ambulatory Visit: Payer: Self-pay

## 2019-06-11 ENCOUNTER — Ambulatory Visit: Payer: Medicare Other | Admitting: Cardiology

## 2019-06-11 VITALS — BP 141/74 | HR 76 | Temp 97.8°F | Ht 71.0 in | Wt 264.8 lb

## 2019-06-11 DIAGNOSIS — M6281 Muscle weakness (generalized): Secondary | ICD-10-CM

## 2019-06-11 DIAGNOSIS — E7800 Pure hypercholesterolemia, unspecified: Secondary | ICD-10-CM

## 2019-06-11 DIAGNOSIS — I10 Essential (primary) hypertension: Secondary | ICD-10-CM | POA: Diagnosis not present

## 2019-06-11 DIAGNOSIS — R0609 Other forms of dyspnea: Secondary | ICD-10-CM | POA: Diagnosis not present

## 2019-06-11 DIAGNOSIS — I739 Peripheral vascular disease, unspecified: Secondary | ICD-10-CM | POA: Diagnosis not present

## 2019-06-11 MED ORDER — CILOSTAZOL 100 MG PO TABS: 100.0000 mg | ORAL_TABLET | Freq: Two times a day (BID) | ORAL | 2 refills | Status: DC

## 2019-06-11 MED ORDER — LOSARTAN POTASSIUM 25 MG PO TABS: 25.0000 mg | ORAL_TABLET | Freq: Every day | ORAL | 2 refills | Status: DC

## 2019-06-11 NOTE — Progress Notes (Signed)
Primary Physician/Referring:  Velna Hatchet, MD  Patient ID: Andrew Larsen, male    DOB: 1941/01/19, 79 y.o.   MRN: [id number]  Chief Complaint  Patient presents with  . Medication Refill  . Follow-up    2 year c/o sob, feet swelling   HPI:    Andrew Larsen  is a 79 y.o. African-American male with hypertension, hyperlipidemia, uncontrolled diabetes mellitus with peripheral arterial disease and history of angioplasty to left SFA in 2018, obesity, presents here for reevaluation of peripheral arterial disease and dyspnea on exertion.  I had last seen him in 2018.   Patient is admitted with Covid pneumonia on 11/03/2018 and discharged to rehabilitation on 12/01/2018 and eventually home on 12/22/2018.  Since then he has noticed marked dyspnea, fatigue and also bilateral lower extremity weakness now she is walking with a walker.  Previously was walking with a cane.  He came in to reestablish for peripheral arterial disease evaluation and dyspnea.   Past Medical History:  Diagnosis Date  . CKD (chronic kidney disease)    CKD stage III (01/2018)  . Daytime somnolence   . Diabetes mellitus without complication ([place])   . Fracture    right fibula/wears boot cast  . GERD (gastroesophageal reflux disease)   . Glaucoma   . Hypercholesteremia   . Hyperlipidemia   . Hypertension   . Mold exposure   . PAD (peripheral artery disease) ([place])   . Sleep apnea    Past Surgical History:  Procedure Laterality Date  . LOWER EXTREMITY ANGIOGRAPHY N/A 06/12/2016   Procedure: Lower Extremity Angiography;  Surgeon: Adrian Prows, MD;  Location: [facility];  Service: Cardiovascular;  Laterality: N/A;  . MUSCLE BIOPSY Right 05/06/2018   Procedure: right vastus lateralis muscle biopsy;  Surgeon: Earnie Larsson, MD;  Location: [facility];  Service: Neurosurgery;  Laterality: Right;  . ORIF ANKLE FRACTURE Right 10/02/2012   Procedure: OPEN REDUCTION INTERNAL FIXATION (ORIF) ANKLE FRACTURE;  Surgeon: Meredith Pel, MD;  Location: [facility];  Service: Orthopedics;  Laterality: Right;  . PERIPHERAL VASCULAR ATHERECTOMY Left 06/12/2016   Procedure: Peripheral Vascular Atherectomy-Left Popliteal;  Surgeon: Adrian Prows, MD;  Location: [facility];  Service: Cardiovascular;  Laterality: Left;  . PERIPHERAL VASCULAR INTERVENTION Left 06/12/2016   Procedure: Peripheral Vascular Intervention- DCB Left Popliteal;  Surgeon: Adrian Prows, MD;  Location: [facility];  Service: Cardiovascular;  Laterality: Left;   Social History   Tobacco Use  . Smoking status: Former Smoker    Packs/day: 1.00    Years: 20.00    Pack years: 20.00    Quit date: 08/22/1981    Years since quitting: 37.8  . Smokeless tobacco: Never Used  Substance Use Topics  . Alcohol use: No    Alcohol/week: 0.0 standard drinks    ROS  Review of Systems  Constitution: Positive for malaise/fatigue.  Cardiovascular: Positive for claudication, dyspnea on exertion and leg swelling.  Musculoskeletal: Positive for joint pain.  Gastrointestinal: Negative for melena.  Neurological: Positive for paresthesias (feet) and weakness.   Objective  Blood pressure (!) 141/74, pulse 76, temperature 97.8 F (36.6 C), height 5\' 11"  (1.803 m), weight 264 lb 12.8 oz (120.1 kg), SpO2 97 %.  Vitals with BMI 06/11/2019 03/25/2019 03/19/2019  Height 5\' 11"  5\' 11"  5\' 11"   Weight 264 lbs 13 oz 265 lbs 263 lbs  BMI 36.95 [id number]  Systolic [id number]  Diastolic 74 78 78  Pulse 76 75  80     Physical Exam  Constitutional:  Morbidly obese in no acute distress.  Cardiovascular: Normal rate, regular rhythm and normal heart sounds. Exam reveals no gallop.  No murmur heard. Pulses:      Carotid pulses are 2+ on the right side and 2+ on the left side with bruit.      Femoral pulses are 2+ on the right side and 2+ on the left side.      Popliteal pulses are 0 on the right side and 0 on the left side.       Dorsalis pedis pulses are 2+ on the right side  and 0 on the left side.       Posterior tibial pulses are 0 on the right side and 0 on the left side.  1-2 + bilateral pitting ankle edema. JVD difficult to see due to short neck. Left leg cooler than right. Capillary refill normal.   Pulmonary/Chest: Effort normal and breath sounds normal.  Abdominal: Soft. Bowel sounds are normal.  Obese. Pannus present   Laboratory examination:   Recent Labs    12/08/18 0923 12/12/18 0850 12/15/18 0816  NA 138 135 135  K 4.6 4.3 4.1  CL 98 97* 97*  CO2 31 28 27   GLUCOSE 269* 200* 247*  BUN 22 25* 27*  CREATININE 1.49* 1.36* 1.44*  CALCIUM 8.9 8.7* 9.1  GFRNONAA 45* 50* 47*  GFRAA 52* 58* 54*   CrCl cannot be calculated (Patient's most recent lab result is older than the maximum 21 days allowed.).  CMP Latest Ref Rng & Units 12/15/2018 12/12/2018 12/08/2018  Glucose 70 - 99 mg/dL 247(H) 200(H) 269(H)  BUN 8 - 23 mg/dL 27(H) 25(H) 22  Creatinine 0.61 - 1.24 mg/dL 1.44(H) 1.36(H) 1.49(H)  Sodium 135 - 145 mmol/L 135 135 138  Potassium 3.5 - 5.1 mmol/L 4.1 4.3 4.6  Chloride 98 - 111 mmol/L 97(L) 97(L) 98  CO2 22 - 32 mmol/L 27 28 31   Calcium 8.9 - 10.3 mg/dL 9.1 8.7(L) 8.9  Total Protein 6.5 - 8.1 g/dL - 5.1(L) -  Total Bilirubin 0.3 - 1.2 mg/dL - 0.7 -  Alkaline Phos 38 - 126 U/L - 52 -  AST 15 - 41 U/L - 25 -  ALT 0 - 44 U/L - 24 -   CBC Latest Ref Rng & Units 12/15/2018 12/12/2018 12/08/2018  WBC 4.0 - 10.5 K/uL 4.9 4.0 3.4(L)  Hemoglobin 13.0 - 17.0 g/dL 13.8 12.6(L) 13.8  Hematocrit 39.0 - 52.0 % 41.1 37.6(L) 41.0  Platelets 150 - 400 K/uL 226 185 184   Lipid Panel     Component Value Date/Time   CHOL 95 11/08/2018 0500   TRIG 104 11/08/2018 0500   HDL 35 (L) 11/08/2018 0500   CHOLHDL 2.7 11/08/2018 0500   VLDL 21 11/08/2018 0500   LDLCALC 39 11/08/2018 0500   HEMOGLOBIN A1C Lab Results  Component Value Date   HGBA1C 7.3 (H) 11/08/2018   MPG 162.81 11/08/2018   TSH No results for input(s): TSH in the last 8760  hours.  External labs 02/03/2019:   Cholesterol, total 181.000 m 02/03/2019 HDL 54 MG/DL 02/03/2019 LDL 112.000 m 02/03/2019 Triglycerides 73.000 02/03/2019  A1C 7.000 % 02/03/2019 Hemoglobin 13.800 g/ 02/03/2019, INR 0.900 06/05/2016 Platelets 185.000 12/12/2018  Creatinine, Serum 1.500 mg/ 02/03/2019 Potassium 4.300 12/12/2018 ALT (SGPT) 18.000 uni 02/03/2019 TSH 1.700 02/03/2019  BNP 16.600 PG 04/17/2017  Medications and allergies   Allergies  Allergen Reactions  . Metformin And Related   .  Simbrinza [Brinzolamide-Brimonidine]     Drainage, redness to eyes     Current Outpatient Medications  Medication Instructions  . albuterol (VENTOLIN HFA) 108 (90 Base) MCG/ACT inhaler 2 puffs, Inhalation, Every 4 hours PRN  . ALPRAZolam (XANAX) 0.5 MG tablet TK 1 T PO HS  . ascorbic acid (VITAMIN C) 500 mg, Oral, Daily  . aspirin 81 mg, Oral, Daily at bedtime  . calcium carbonate (CALCIUM 600) 1,200 mg, Oral, Daily  . cilostazol (PLETAL) 100 mg, Oral, 2 times daily  . Evolocumab (REPATHA) 140 mg, Subcutaneous, Every 14 days  . famotidine (PEPCID) 20 mg, Oral, Daily at bedtime  . folic acid (FOLVITE) 1 mg, Oral, Daily  . furosemide (LASIX) 40 mg, Oral, Daily  . gabapentin (NEURONTIN) 600 mg, Oral, 3 times daily  . insulin aspart protamine - aspart (NOVOLOG MIX 70/30 FLEXPEN) (70-30) 100 UNIT/ML FlexPen 36 Units, Subcutaneous, Daily with breakfast  . latanoprost (XALATAN) 0.005 % ophthalmic solution 1 drop, Daily at bedtime  . magnesium gluconate (MAGONATE) 500 mg, Oral, Daily  . metoprolol tartrate (LOPRESSOR) 25 mg, Oral, 2 times daily  . Multiple Vitamin (MULTIVITAMIN) capsule 1 capsule, Oral, Daily  . oxybutynin (DITROPAN) 5 mg, Oral, 2 times daily  . potassium chloride SA (K-DUR) 20 MEQ tablet 20 mEq, Oral, Daily  . tamsulosin (FLOMAX) 0.4 mg, Oral, Daily  . timolol (TIMOPTIC) 0.5 % ophthalmic solution INT 1 GTT IN EACH EYE BID  . Vitamin D3 10,000 Units, Oral, Daily  . zinc sulfate  220 mg, Oral, Daily    Radiology:  No results found. BMP nodule Cardiac Studies:   Echocardiogram 05/03/2016: Poor apical viwes and wall motion with reduced sensitivity. Left ventricle cavity is normal in size. Mild concentric hypertrophy of the left ventricle. Normal global wall motion. Doppler evidence of grade II (pseudonormal) diastolic dysfunction, elevated LAP. Calculated EF 55%. Trace tricuspid regurgitation. Unable to estimate PA pressure due to absence/minimal TR signal. The aortic root is normal in size. Mild atherosclerotic changes in the aorta.  [place] Stress Test 05/04/2013: 1. The resting electrocardiogram demonstrated normal sinus rhythm, normal resting conduction and no resting arrhythmias.  Stress EKG is non-diagnostic for ischemia as it a pharmacologic stress using Lexiscan. Stress symptoms included dyspnea. 2. The perfusion imaging study demonstrates soft tissue attenuation artifact in the inferior wall. There is no demonstrable ischemia or scar. LV systolic function was normal and calculated at 65%. This is a low risk study.  Peripheral arteriogram 06/12/2016: Left SFA calcific 100% to 0% with CSI atherectomy and Drug coated stenting with 6.0x120 mm Zilver PTX. Left AT occluded prox and reconstitutes at ankle.  Lower extremity arterial duplex 04/11/2017: No hemodynamically significant stenoses are identified in the lower extremity arterial system. There is mild diffuse disease bilateral with biphasic waveforms.  This exam reveals moderately decreased perfusion of the right lower extremity, with RABI 0.79 noted at the dorsalis pedis artery level (ABI) and mildly decreased perfusion of the left lower extremity with LABI 0.82, noted at the post tibial artery level (ABI). Left SFA and popliteal artery angioplasty site is patent.  Compared to 09/19/2016, no significant change noted.   Assessment     ICD-10-CM   1. Essential hypertension  I10 EKG 12-Lead  2.  Claudication in peripheral vascular disease (HCC)  I73.9   3. Primary hypertension  I10   4. Hypercholesteremia  E78.00     EKG 06/11/2019: Sinus rhythm with first-degree block rate of 77 bpm, left atrial enlargement, left axis deviation, incomplete right  bundle branch block.  Poor R wave progression, cannot exclude anteroseptal infarct old.  No evidence of ischemia.   Compared to 04/16/2017, first-degree AV block new.  No orders of the defined types were placed in this encounter.   Medications Discontinued During This Encounter  Medication Reason  . benzonatate (TESSALON) 200 MG capsule Error     Recommendations:   Andrew Larsen  is a 79 y.o. African-American male with hypertension, hyperlipidemia, uncontrolled diabetes mellitus with peripheral arterial disease and history of angioplasty to left SFA in 2018, obesity, presents here for reevaluation of peripheral arterial disease and dyspnea on exertion.  I had last seen him in 2018.   Patient is admitted with Covid pneumonia on 11/03/2018 and discharged to rehabilitation on 12/01/2018 and eventually home on 12/22/2018. Now has worsening dyspnea and bilateral leg weakness and thigh cramping.   His bilateral leg weakness could be from critical illness neuropathy but he also has left foot which is colder than the right and absent pulses and may also indicate significant progression of peripheral arterial disease as well in view of controlled diabetes, hypertension and hyperlipidemia.  I'll restart Pletal.  With regard to her lipids, although he is on Repatha, still LDL is not at goal.  We will repeat his lipid profile testing.Shortness of breath could be related to deconditioning and his gained his weight back.  Lost 30 pounds but has gained 20 pounds in weight since hospital discharge.  We discussed regarding weight loss again.  Coronary artery disease need to be excluded, we will obtain Lexiscan Myoview stress test, unable to walk due to peripheral  neuropathy.  We will also plan echocardiogram.  Blood pressure is mildly elevated today, also due to diabetic state, we'll add losartan 25 mg daily, he does have chronic stage III kidney disease hence will repeat CMP in about [redacted] weeks along with TSH, A1c and CMP and CBC and I'll forward a copy to Dr. Ardeth Perfect.  I'd like to see him back in 4-6 weeks for follow-up.  Adrian Prows, MD, [facility] 06/11/2019, 11:02 AM [facility]. [place] Office: [phone]

## 2019-06-12 ENCOUNTER — Ambulatory Visit: Payer: Medicare HMO

## 2019-06-15 ENCOUNTER — Other Ambulatory Visit: Payer: Self-pay

## 2019-06-15 ENCOUNTER — Ambulatory Visit: Payer: Medicare Other

## 2019-06-15 DIAGNOSIS — R0609 Other forms of dyspnea: Secondary | ICD-10-CM

## 2019-06-24 ENCOUNTER — Ambulatory Visit: Payer: Medicare Other

## 2019-06-24 ENCOUNTER — Other Ambulatory Visit: Payer: Self-pay

## 2019-06-24 DIAGNOSIS — I739 Peripheral vascular disease, unspecified: Secondary | ICD-10-CM

## 2019-06-24 DIAGNOSIS — R0609 Other forms of dyspnea: Secondary | ICD-10-CM

## 2019-07-14 LAB — CMP14+EGFR
ALT: 21 IU/L (ref 0–44)
AST: 26 IU/L (ref 0–40)
Albumin/Globulin Ratio: 1.8 (ref 1.2–2.2)
Albumin: 3.9 g/dL (ref 3.7–4.7)
Alkaline Phosphatase: 112 IU/L (ref 39–117)
BUN/Creatinine Ratio: 10 (ref 10–24)
BUN: 14 mg/dL (ref 8–27)
Bilirubin Total: 0.3 mg/dL (ref 0.0–1.2)
CO2: 27 mmol/L (ref 20–29)
Calcium: 9.1 mg/dL (ref 8.6–10.2)
Chloride: 102 mmol/L (ref 96–106)
Creatinine, Ser: 1.47 mg/dL — ABNORMAL HIGH (ref 0.76–1.27)
GFR calc Af Amer: 53 mL/min/{1.73_m2} — ABNORMAL LOW (ref >59)
GFR calc non Af Amer: 46 mL/min/{1.73_m2} — ABNORMAL LOW (ref >59)
Globulin, Total: 2.2 g/dL (ref 1.5–4.5)
Glucose: 100 mg/dL — ABNORMAL HIGH (ref 65–99)
Potassium: 4.2 mmol/L (ref 3.5–5.2)
Sodium: 141 mmol/L (ref 134–144)
Total Protein: 6.1 g/dL (ref 6.0–8.5)

## 2019-07-14 LAB — LIPID PANEL WITH LDL/HDL RATIO
Cholesterol, Total: 182 mg/dL (ref 100–199)
HDL: 46 mg/dL (ref >39)
LDL Chol Calc (NIH): 118 mg/dL — ABNORMAL HIGH (ref 0–99)
LDL/HDL Ratio: 2.6 ratio (ref 0.0–3.6)
Triglycerides: 99 mg/dL (ref 0–149)
VLDL Cholesterol Cal: 18 mg/dL (ref 5–40)

## 2019-07-14 LAB — TSH: TSH: 3.91 u[IU]/mL (ref 0.450–4.500)

## 2019-07-14 LAB — CK: Total CK: 534 U/L (ref 41–331)

## 2019-07-30 ENCOUNTER — Other Ambulatory Visit: Payer: Self-pay

## 2019-07-30 ENCOUNTER — Ambulatory Visit (INDEPENDENT_AMBULATORY_CARE_PROVIDER_SITE_OTHER)
Admission: RE | Admit: 2019-07-30 | Discharge: 2019-07-30 | Disposition: A | Discharge status: Home / Self Care | Payer: Medicare Other | Source: Ambulatory Visit | Attending: Pulmonary Disease | Admitting: Pulmonary Disease

## 2019-07-30 DIAGNOSIS — R0902 Hypoxemia: Secondary | ICD-10-CM

## 2019-07-30 DIAGNOSIS — Z8616 Personal history of COVID-19: Secondary | ICD-10-CM

## 2019-07-30 DIAGNOSIS — J8410 Pulmonary fibrosis, unspecified: Secondary | ICD-10-CM

## 2019-08-10 ENCOUNTER — Other Ambulatory Visit: Payer: Self-pay

## 2019-08-10 ENCOUNTER — Ambulatory Visit: Payer: Medicare Other | Admitting: Cardiology

## 2019-08-10 ENCOUNTER — Encounter: Payer: Self-pay | Admitting: Cardiology

## 2019-08-10 VITALS — BP 130/70 | HR 81 | Temp 98.2°F | Resp 15 | Ht 71.0 in | Wt 264.0 lb

## 2019-08-10 DIAGNOSIS — I739 Peripheral vascular disease, unspecified: Secondary | ICD-10-CM

## 2019-08-10 DIAGNOSIS — I10 Essential (primary) hypertension: Secondary | ICD-10-CM

## 2019-08-10 DIAGNOSIS — E7800 Pure hypercholesterolemia, unspecified: Secondary | ICD-10-CM

## 2019-08-10 DIAGNOSIS — E1122 Type 2 diabetes mellitus with diabetic chronic kidney disease: Secondary | ICD-10-CM

## 2019-08-10 DIAGNOSIS — M6281 Muscle weakness (generalized): Secondary | ICD-10-CM

## 2019-08-10 DIAGNOSIS — R0609 Other forms of dyspnea: Secondary | ICD-10-CM

## 2019-08-10 MED ORDER — ROSUVASTATIN CALCIUM 10 MG PO TABS: 10.0000 mg | ORAL_TABLET | Freq: Every day | ORAL | 3 refills | Status: DC

## 2019-08-10 NOTE — Progress Notes (Signed)
Primary Physician/Referring:  Velna Hatchet, MD  Patient ID: Andrew Larsen, male    DOB: Jul 21, 1940, 79 y.o.   MRN: [id number]  Chief Complaint  Patient presents with  . Peripheral artery disease    6 week  . Hypertension  . Hyperlipidemia   HPI:    Andrew Larsen  is a 79 y.o. African-American male with hypertension, hyperlipidemia, uncontrolled diabetes mellitus with peripheral arterial disease and history of angioplasty to left SFA in 2018, obesity, presents here for reevaluation of peripheral arterial disease and dyspnea on exertion.  Patient is admitted with Covid pneumonia on 11/03/2018 and discharged to rehabilitation on 12/01/2018 and eventually home on 12/22/2018.  Since then he has noticed marked dyspnea, fatigue and also bilateral lower extremity weakness now she is walking with a walker.  Previously was walking with a cane.    I had seen him 6 weeks ago, he underwent lower extremity duplex, echocardiogram and stress test and presents for follow-up.  I had also started him on Pletal and losartan for PAD and also hypertension.  Also underwent labs.  States that since being on Pletal symptoms improved, symptoms of dyspnea has remained stable and he has now increased his physical activity.  Unfortunately no significant change in his weight since last office visit.  Denies PND or orthopnea.  Past Medical History:  Diagnosis Date  . CKD (chronic kidney disease)    CKD stage III (01/2018)  . Daytime somnolence   . Diabetes mellitus without complication ([place])   . Fracture    right fibula/wears boot cast  . GERD (gastroesophageal reflux disease)   . Glaucoma   . Hypercholesteremia   . Hyperlipidemia   . Hypertension   . Mold exposure   . PAD (peripheral artery disease) ([place])   . Sleep apnea    Past Surgical History:  Procedure Laterality Date  . LOWER EXTREMITY ANGIOGRAPHY N/A 06/12/2016   Procedure: Lower Extremity Angiography;  Surgeon: Adrian Prows, MD;  Location: [facility];  Service: Cardiovascular;  Laterality: N/A;  . MUSCLE BIOPSY Right 05/06/2018   Procedure: right vastus lateralis muscle biopsy;  Surgeon: Earnie Larsson, MD;  Location: [facility];  Service: Neurosurgery;  Laterality: Right;  . ORIF ANKLE FRACTURE Right 10/02/2012   Procedure: OPEN REDUCTION INTERNAL FIXATION (ORIF) ANKLE FRACTURE;  Surgeon: Meredith Pel, MD;  Location: [facility];  Service: Orthopedics;  Laterality: Right;  . PERIPHERAL VASCULAR ATHERECTOMY Left 06/12/2016   Procedure: Peripheral Vascular Atherectomy-Left Popliteal;  Surgeon: Adrian Prows, MD;  Location: [facility];  Service: Cardiovascular;  Laterality: Left;  . PERIPHERAL VASCULAR INTERVENTION Left 06/12/2016   Procedure: Peripheral Vascular Intervention- DCB Left Popliteal;  Surgeon: Adrian Prows, MD;  Location: [facility];  Service: Cardiovascular;  Laterality: Left;   Social History   Tobacco Use  . Smoking status: Former Smoker    Packs/day: 1.00    Years: 20.00    Pack years: 20.00    Quit date: 08/22/1981    Years since quitting: 37.9  . Smokeless tobacco: Never Used  Substance Use Topics  . Alcohol use: No    Alcohol/week: 0.0 standard drinks    ROS  Review of Systems  Constitution: Positive for malaise/fatigue.  Cardiovascular: Positive for claudication, dyspnea on exertion and leg swelling.  Musculoskeletal: Positive for joint pain.  Gastrointestinal: Negative for melena.  Neurological: Positive for paresthesias (feet) and weakness.   Objective  Blood pressure 130/70, pulse 81, temperature 98.2 F (36.8 C), temperature  source Temporal, resp. rate 15, height 5\' 11"  (1.803 m), weight 264 lb (119.7 kg), SpO2 95 %.  Vitals with BMI 08/10/2019 06/11/2019 03/25/2019  Height 5\' 11"  5\' 11"  5\' 11"   Weight 264 lbs 264 lbs 13 oz 265 lbs  BMI 36.84 [id number]  Systolic [id number]  Diastolic 70 74 78  Pulse 81 76 75     Physical Exam  Constitutional:  Morbidly obese in no acute distress.    Cardiovascular: Normal rate, regular rhythm and normal heart sounds. Exam reveals no gallop.  No murmur heard. Pulses:      Carotid pulses are 2+ on the right side and 2+ on the left side with bruit.      Femoral pulses are 2+ on the right side and 2+ on the left side.      Popliteal pulses are 0 on the right side and 0 on the left side.       Dorsalis pedis pulses are 2+ on the right side and 0 on the left side.       Posterior tibial pulses are 0 on the right side and 0 on the left side.  1-2 + bilateral pitting ankle edema. JVD difficult to see due to short neck. Left leg cooler than right. Capillary refill normal.   Pulmonary/Chest: Effort normal and breath sounds normal.  Abdominal: Soft. Bowel sounds are normal.  Obese. Pannus present   Laboratory examination:   Recent Labs    12/12/18 0850 12/15/18 0816 07/13/19 0833  NA 135 135 141  K 4.3 4.1 4.2  CL 97* 97* 102  CO2 28 27 27   GLUCOSE 200* 247* 100*  BUN 25* 27* 14  CREATININE 1.36* 1.44* 1.47*  CALCIUM 8.7* 9.1 9.1  GFRNONAA 50* 47* 46*  GFRAA 58* 54* 53*   CrCl cannot be calculated (Patient's most recent lab result is older than the maximum 21 days allowed.).  CMP Latest Ref Rng & Units 07/13/2019 12/15/2018 12/12/2018  Glucose 65 - 99 mg/dL 100(H) 247(H) 200(H)  BUN 8 - 27 mg/dL 14 27(H) 25(H)  Creatinine 0.76 - 1.27 mg/dL 1.47(H) 1.44(H) 1.36(H)  Sodium 134 - 144 mmol/L 141 135 135  Potassium 3.5 - 5.2 mmol/L 4.2 4.1 4.3  Chloride 96 - 106 mmol/L 102 97(L) 97(L)  CO2 20 - 29 mmol/L 27 27 28   Calcium 8.6 - 10.2 mg/dL 9.1 9.1 8.7(L)  Total Protein 6.0 - 8.5 g/dL 6.1 - 5.1(L)  Total Bilirubin 0.0 - 1.2 mg/dL 0.3 - 0.7  Alkaline Phos 39 - 117 IU/L 112 - 52  AST 0 - 40 IU/L 26 - 25  ALT 0 - 44 IU/L 21 - 24   CBC Latest Ref Rng & Units 12/15/2018 12/12/2018 12/08/2018  WBC 4.0 - 10.5 K/uL 4.9 4.0 3.4(L)  Hemoglobin 13.0 - 17.0 g/dL 13.8 12.6(L) 13.8  Hematocrit 39.0 - 52.0 % 41.1 37.6(L) 41.0  Platelets 150 - 400  K/uL 226 185 184   Lipid Panel     Component Value Date/Time   CHOL 182 07/13/2019 0833   TRIG 99 07/13/2019 0833   HDL 46 07/13/2019 0833   CHOLHDL 2.7 11/08/2018 0500   VLDL 21 11/08/2018 0500   LDLCALC 118 (H) 07/13/2019 0833   HEMOGLOBIN A1C Lab Results  Component Value Date   HGBA1C 7.3 (H) 11/08/2018   MPG 162.81 11/08/2018   TSH Recent Labs    07/13/19 0833  TSH 3.910    External labs 02/03/2019:   Cholesterol, total 181.000  m 02/03/2019 HDL 54 MG/DL 02/03/2019 LDL 112.000 m 02/03/2019 Triglycerides 73.000 02/03/2019  A1C 7.000 % 02/03/2019 Hemoglobin 13.800 g/ 02/03/2019, INR 0.900 06/05/2016 Platelets 185.000 12/12/2018  Creatinine, Serum 1.500 mg/ 02/03/2019 Potassium 4.300 12/12/2018 ALT (SGPT) 18.000 uni 02/03/2019 TSH 1.700 02/03/2019  BNP 16.600 PG 04/17/2017  Medications and allergies   Allergies  Allergen Reactions  . Metformin And Related   . Simbrinza [Brinzolamide-Brimonidine]     Drainage, redness to eyes     Current Outpatient Medications  Medication Instructions  . albuterol (VENTOLIN HFA) 108 (90 Base) MCG/ACT inhaler 2 puffs, Inhalation, Every 4 hours PRN  . ALPRAZolam (XANAX) 0.5 MG tablet TK 1 T PO HS  . ascorbic acid (VITAMIN C) 500 mg, Oral, Daily  . aspirin 81 mg, Oral, Daily at bedtime  . calcium carbonate (CALCIUM 600) 1,200 mg, Oral, Daily  . cilostazol (PLETAL) 100 mg, Oral, 2 times daily  . famotidine (PEPCID) 20 mg, Oral, Daily at bedtime  . folic acid (FOLVITE) 1 mg, Oral, Daily  . furosemide (LASIX) 40 mg, Oral, Daily  . gabapentin (NEURONTIN) 600 mg, Oral, 3 times daily  . insulin aspart protamine - aspart (NOVOLOG MIX 70/30 FLEXPEN) (70-30) 100 UNIT/ML FlexPen 36 Units, Subcutaneous, Daily with breakfast  . latanoprost (XALATAN) 0.005 % ophthalmic solution 1 drop, Daily at bedtime  . losartan (COZAAR) 25 mg, Oral, Daily  . magnesium gluconate (MAGONATE) 500 mg, Oral, Daily  . metoprolol tartrate (LOPRESSOR) 25 mg, Oral, 2  times daily  . Multiple Vitamin (MULTIVITAMIN) capsule 1 capsule, Oral, Daily  . oxybutynin (DITROPAN) 5 mg, Oral, 2 times daily  . potassium chloride SA (K-DUR) 20 MEQ tablet 20 mEq, Oral, Daily  . rosuvastatin (CRESTOR) 10 mg, Oral, Daily  . tamsulosin (FLOMAX) 0.4 mg, Oral, Daily  . timolol (TIMOPTIC) 0.5 % ophthalmic solution INT 1 GTT IN EACH EYE BID  . Vitamin D3 10,000 Units, Oral, Daily  . zinc sulfate 220 mg, Oral, Daily    Radiology:  No results found. BMP nodule Cardiac Studies:   Peripheral arteriogram 06/12/2016: Left SFA calcific 100% to 0% with CSI atherectomy and Drug coated stenting with 6.0x120 mm Zilver PTX. Left AT occluded prox and reconstitutes at ankle.  Lower Extremity Arterial Duplex 06/24/2019:  No hemodynamically significant stenoses are identified in the lower  extremity arterial system. There is diffuse small vessel disease below  knee bilaterally. Left SFA and popliteal artery angioplasty site is  patent.  This exam reveals moderately decreased perfusion of the right lower  extremity, noted at the anterior tibial and post tibial artery level (ABI  0.77) with severely abnormal waveform and mildly decreased perfusion of  the left lower extremity, noted at the anterior tibial and post tibial  artery level (ABI 0.85) with moderately abnormal waveform.  No significant change from 04/11/2017.  LexiscanTetrofosmin Stress Test  06/15/2019: Nondiagnostic ECG stress. Myocardial perfusion is normal. Stress LV EF: 59%.  No change from 05/14/2013. Low risk study.   Echocardiogram 06/24/2019:  Study Quality: Technically Difficult  Normal LV systolic function with visual EF 60-65%. Left ventricle cavity  is normal in size. Moderate left ventricular hypertrophy. Normal global  wall motion. Indeterminate diastolic filling pattern, elevated LAP. No obvious regional wall motion abnormalities. Calculated EF 69%.  Mild mitral regurgitation. Mild calcification of the  mitral valve  annulus. No other significant valvular abnormalities.  Prior study dated 05/03/2016 Mild concentric hypertrophy of the left ventricle.  Normal global wall motion. Grade II diastolic dysfunction, elevated LAP.  Assessment     ICD-10-CM   1. Claudication in peripheral vascular disease (HCC)  I73.9   2. Muscle weakness  M62.81   3. Dyspnea on exertion  R06.00   4. Essential hypertension  I10   5. Type 2 diabetes mellitus with stage 3a chronic kidney disease, without long-term current use of insulin (HCC)  E11.21    N18.31   6. Hypercholesteremia  E78.00 rosuvastatin (CRESTOR) 10 MG tablet    Lipid Panel With LDL/HDL Ratio    Lipid Panel With LDL/HDL Ratio    EKG 06/11/2019: Sinus rhythm with first-degree block rate of 77 bpm, left atrial enlargement, left axis deviation, incomplete right bundle branch block.  Poor R wave progression, cannot exclude anteroseptal infarct old.  No evidence of ischemia.   Compared to 04/16/2017, first-degree AV block new.  Meds ordered this encounter  Medications  . rosuvastatin (CRESTOR) 10 MG tablet    Sig: Take 1 tablet (10 mg total) by mouth daily.    Dispense:  90 tablet    Refill:  3    There are no discontinued medications.   Recommendations:   Andrew Larsen  is a 79 y.o. African-American male with hypertension, hyperlipidemia, uncontrolled diabetes mellitus with peripheral arterial disease and history of angioplasty to left SFA in 2018, obesity, presents here for reevaluation of peripheral arterial disease and dyspnea on exertion.  Patient is admitted with Covid pneumonia on 11/03/2018 and discharged to rehabilitation on 12/01/2018 and eventually home on 12/22/2018. Now has worsening dyspnea and bilateral leg weakness and thigh cramping.   His bilateral leg weakness could be from critical illness neuropathy and also related to severe small vessel disease related to DM. No significant change in Lower Extremity Arterial Duplex and no e/o  CLI. Hence continue Pletal and ASA 81 mg daily.   With regard to his lipids, although was approved for Repatha, due to cost, he has not been taking it. LDL is not at goal obviously and he would like to try statins again. He was previously on pravastatin. I will add Crestor 10 mg daily and recheck lipids in 2 months.    For hypertension and DM I had added Losartan low dose 25 mg, renal function stable with stage 3a CKD.  I reviewed his echo and stress test. Reassured him, dyspnea is related to deconditioning, obesity hypoventilation.  Encouraged him to continue to lose weight and exercise regularly, low-salt diet also discussed.  I will see him back in 3 months for follow-up of PAD and hypertension.  Adrian Prows, MD, [facility] 08/10/2019, 12:12 PM [facility]. [place] Office: [phone]

## 2019-08-30 ENCOUNTER — Other Ambulatory Visit: Payer: Self-pay | Admitting: Cardiology

## 2019-08-30 DIAGNOSIS — I739 Peripheral vascular disease, unspecified: Secondary | ICD-10-CM

## 2019-08-30 DIAGNOSIS — I10 Essential (primary) hypertension: Secondary | ICD-10-CM

## 2019-10-05 ENCOUNTER — Other Ambulatory Visit: Payer: Self-pay | Admitting: Cardiology

## 2019-10-05 DIAGNOSIS — I739 Peripheral vascular disease, unspecified: Secondary | ICD-10-CM

## 2019-10-05 DIAGNOSIS — I10 Essential (primary) hypertension: Secondary | ICD-10-CM

## 2019-10-26 ENCOUNTER — Institutional Professional Consult (permissible substitution): Payer: Medicare Other | Admitting: Pulmonary Disease

## 2019-10-30 ENCOUNTER — Ambulatory Visit: Payer: Medicare Other | Admitting: Pulmonary Disease

## 2019-10-30 ENCOUNTER — Other Ambulatory Visit: Payer: Self-pay

## 2019-10-30 ENCOUNTER — Encounter: Payer: Self-pay | Admitting: Pulmonary Disease

## 2019-10-30 VITALS — BP 126/62 | HR 68 | Temp 98.6°F | Ht 71.0 in | Wt 269.4 lb

## 2019-10-30 DIAGNOSIS — J849 Interstitial pulmonary disease, unspecified: Secondary | ICD-10-CM | POA: Diagnosis not present

## 2019-10-30 NOTE — Patient Instructions (Signed)
We will get some labs today for evaluation of connective tissue disease Schedule pulmonary function test Follow-up in 3 months.

## 2019-10-30 NOTE — Progress Notes (Signed)
Andrew Larsen    [id number]    01-14-1941  Primary Care Physician:Velna Hatchet, MD  Referring Physician: Velna Hatchet, [address] [place],  [place] [address]  Chief complaint:  Consult for  ILD, post COVID-19 [July 2020] History of inclusion body myositis. positive NT5C1A antibody  HPI: 79 year old with hypertension, hyperlipidemia, diabetes mellitus, OSA, CKD, GERD  Admitted to [facility] on July 2020 for severe COVID-19 pneumonia treated with high flow nasal cannula, remdesivir, Tocilizumab, steroids and convalescent plasma.  Discharge to rehab on supplemental oxygen.  Patient had a prolonged recovery with slow improvement.  Wean himself off oxygen around #2020.  Still has some dyspnea on exertion CT scan shows post Covid fibrosis and has been referred to allergy clinic for further evaluation.  Diagnosed with inclusion body myositis in 2020 at [facility] after muscle biopsy and EMG.  Is currently not receiving any specific therapy for this. Has completed PT.  Chief complaint is difficulty with balance and uses a walker.  Pets: No pets Occupation: Retired Garment/textile technologist Exposures: No known exposures.  No mold, hot tub, Jacuzzi.  No feather pillows or comforters ILD questionnaire 10/30/2019-negative for exposures Smoking history: 20-pack-year smoker.  Quit in 1983 Travel history: No significant travel history Relevant family history: No significant family issue of lung disease  Outpatient Encounter Medications as of 10/30/2019  Medication Sig  . albuterol (VENTOLIN HFA) 108 (90 Base) MCG/ACT inhaler Inhale 2 puffs into the lungs every 4 (four) hours as needed for wheezing or shortness of breath.  . ALPRAZolam (XANAX) 0.5 MG tablet TK 1 T PO HS  . aspirin 81 MG chewable tablet Chew 81 mg by mouth at bedtime.   . calcium carbonate (CALCIUM 600) 600 MG TABS tablet Take 2 tablets (1,200 mg total) by mouth daily.  . Cholecalciferol (VITAMIN D3) 5000 units TABS  Take 10,000 Units by mouth daily.   . cilostazol (PLETAL) 100 MG tablet TAKE 1 TABLET(100 MG) BY MOUTH TWICE DAILY  . famotidine (PEPCID) 20 MG tablet Take 1 tablet (20 mg total) by mouth at bedtime.  . folic acid (FOLVITE) 1 MG tablet Take 1 tablet (1 mg total) by mouth daily.  . furosemide (LASIX) 40 MG tablet Take 1 tablet (40 mg total) by mouth daily.  Marland Kitchen gabapentin (NEURONTIN) 300 MG capsule Take 2 capsules (600 mg total) by mouth 3 (three) times daily.  . insulin aspart protamine - aspart (NOVOLOG MIX 70/30 FLEXPEN) (70-30) 100 UNIT/ML FlexPen Inject 0.36 mLs (36 Units total) into the skin daily with breakfast. (Patient taking differently: Inject into the skin daily with breakfast. 60 units in AM and 50 units at night)  . latanoprost (XALATAN) 0.005 % ophthalmic solution Place 1 drop into both eyes at bedtime.  Marland Kitchen losartan (COZAAR) 25 MG tablet TAKE 1 TABLET(25 MG) BY MOUTH DAILY  . magnesium gluconate (MAGONATE) 500 MG tablet Take 500 mg by mouth daily.  . metoprolol tartrate (LOPRESSOR) 25 MG tablet Take 1 tablet (25 mg total) by mouth 2 (two) times daily.  . Multiple Vitamin (MULTIVITAMIN) capsule Take 1 capsule by mouth daily.   Marland Kitchen oxybutynin (DITROPAN) 5 MG tablet Take 1 tablet (5 mg total) by mouth 2 (two) times daily.  . potassium chloride SA (K-DUR) 20 MEQ tablet Take 1 tablet (20 mEq total) by mouth daily.  . rosuvastatin (CRESTOR) 10 MG tablet Take 1 tablet (10 mg total) by mouth daily.  . tamsulosin (FLOMAX) 0.4 MG CAPS capsule Take  0.4 mg by mouth daily.  . timolol (TIMOPTIC) 0.5 % ophthalmic solution INT 1 GTT IN EACH EYE BID  . vitamin C (VITAMIN C) 500 MG tablet Take 1 tablet (500 mg total) by mouth daily.  Marland Kitchen zinc sulfate 220 (50 Zn) MG capsule Take 1 capsule (220 mg total) by mouth daily.   No facility-administered encounter medications on file as of 10/30/2019.   Physical Exam: Blood pressure 126/62, pulse 68, temperature 98.6 F (37 C), temperature source Oral, height 5'  11" (1.803 m), weight 269 lb 6.4 oz (122.2 kg), SpO2 99 %. Gen:      No acute distress HEENT:  EOMI, sclera anicteric Neck:     No masses; no thyromegaly Lungs:    Clear to auscultation bilaterally; normal respiratory effort CV:         Regular rate and rhythm; no murmurs Abd:      + bowel sounds; soft, non-tender; no palpable masses, no distension Ext:    No edema; adequate peripheral perfusion Skin:      Warm and dry; no rash Neuro: alert and oriented x 3 Psych: normal mood and affect  Data Reviewed: Imaging: HRCT 01/16/2019-bilateral patchy reticulation, groundglass attenuation with slight upper lobe predominance, associated cylindrical bronchiectasis.  HRCT 07/30/2019-moderate pulmonary fibrosis without clear gradient.  Posterior bronchiectasis, interval improvement in irregular groundglass opacities.    I have reviewed the images personally.  PFTs:  Labs:  Sleep: PSG 12/22/2017-severe OSA with AHI 22.4, titrated to CPAP 12 cm  Assessment:  Pulmonary fibrosis Likely post Covid ILD.  CT scan reviewed with indeterminate pattern with no clear basal gradient He also has history of inclusion body myositis and will need monitoring for any progression We will get baseline CTD serologies and PFTs Is not a candidate for rehab due to issues with balance and weakness from myositis  Severe OSA Follows with [facility] neurology, compliant with CPAP  Discussed in detail with patient and daughter in clinic.  Plan/Recommendations: ILD serologies, PFTs Follow-up in 3 months.  Marshell Garfinkel MD [facility] 10/30/2019, 11:14 AM  CC: Velna Hatchet, MD

## 2019-11-03 LAB — ANA,IFA RA DIAG PNL W/RFLX TIT/PATN
Anti Nuclear Antibody (ANA): POSITIVE — AB
Cyclic Citrullin Peptide Ab: <16 UNITS
Rheumatoid fact SerPl-aCnc: <14 IU/mL (ref <14)

## 2019-11-03 LAB — ANTI-NUCLEAR AB-TITER (ANA TITER): ANA Titer 1: 1:320 {titer} — ABNORMAL HIGH

## 2019-11-03 LAB — SJOGREN'S SYNDROME ANTIBODS(SSA + SSB)
SSA (Ro) (ENA) Antibody, IgG: <1 AI
SSB (La) (ENA) Antibody, IgG: <1 AI

## 2019-11-03 LAB — ANCA SCREEN W REFLEX TITER: ANCA Screen: NEGATIVE

## 2019-11-03 LAB — ANTI-SCLERODERMA ANTIBODY: Scleroderma (Scl-70) (ENA) Antibody, IgG: <1 AI

## 2019-11-06 LAB — HYPERSENSITIVITY PNEUMONITIS
A. Pullulans Abs: NEGATIVE
A.Fumigatus #1 Abs: NEGATIVE
Micropolyspora faeni, IgG: NEGATIVE
Pigeon Serum Abs: NEGATIVE
Thermoact. Saccharii: NEGATIVE
Thermoactinomyces vulgaris, IgG: NEGATIVE

## 2019-11-09 ENCOUNTER — Other Ambulatory Visit: Payer: Self-pay

## 2019-11-09 ENCOUNTER — Ambulatory Visit: Payer: Medicare Other | Admitting: Cardiology

## 2019-11-09 ENCOUNTER — Encounter: Payer: Self-pay | Admitting: Cardiology

## 2019-11-09 VITALS — BP 120/63 | HR 84 | Resp 15 | Ht 71.0 in | Wt 268.0 lb

## 2019-11-09 DIAGNOSIS — E7800 Pure hypercholesterolemia, unspecified: Secondary | ICD-10-CM

## 2019-11-09 DIAGNOSIS — M6281 Muscle weakness (generalized): Secondary | ICD-10-CM

## 2019-11-09 DIAGNOSIS — M6282 Rhabdomyolysis: Secondary | ICD-10-CM

## 2019-11-09 DIAGNOSIS — I10 Essential (primary) hypertension: Secondary | ICD-10-CM

## 2019-11-09 DIAGNOSIS — I739 Peripheral vascular disease, unspecified: Secondary | ICD-10-CM

## 2019-11-09 MED ORDER — EZETIMIBE 10 MG PO TABS: 10.0000 mg | ORAL_TABLET | Freq: Every day | ORAL | 2 refills | Status: DC

## 2019-11-09 NOTE — Progress Notes (Signed)
Primary Physician/Referring:  Velna Hatchet, MD  Patient ID: Andrew Larsen, male    DOB: 12/17/1940, 79 y.o.   MRN: [id number]  Chief Complaint  Patient presents with  . Follow-up    3 month  . PAD  . Hyperlipidemia  . Results   HPI:    Andrew Larsen  is a 79 y.o. African-American male with hypertension, hyperlipidemia, uncontrolled diabetes mellitus with peripheral arterial disease and history of angioplasty to left SFA in 2018, obesity, presents here for reevaluation of peripheral arterial disease and dyspnea on exertion.  Patient is admitted with Covid pneumonia on 11/03/2018 and discharged to rehabilitation on 12/01/2018 and eventually home on 12/22/2018.  Since then he has noticed marked dyspnea, fatigue and also bilateral lower extremity weakness now he is walking with a walker.  Previously was walking with a cane.  He also has a diagnosis of inclusion body myositis at [facility] and has a positive NT5C1A antibody.  This is a 77-month office visit and follow-up, he continues to have marked weakness in his legs and gait instability.  On his last office visit I started him on Crestor 10 mg daily.  He is tolerating this and has not noticed any additional weakness in the legs.  He is now also been closely followed by pulmonary medicine and is being worked up for connective tissue disease and IPF.  Past Medical History:  Diagnosis Date  . CKD (chronic kidney disease)    CKD stage III (01/2018)  . Daytime somnolence   . Diabetes mellitus without complication ([place])   . Fracture    right fibula/wears boot cast  . GERD (gastroesophageal reflux disease)   . Glaucoma   . Hypercholesteremia   . Hyperlipidemia   . Hypertension   . Mold exposure   . PAD (peripheral artery disease) ([place])   . Sleep apnea    Past Surgical History:  Procedure Laterality Date  . LOWER EXTREMITY ANGIOGRAPHY N/A 06/12/2016   Procedure: Lower Extremity Angiography;  Surgeon: Adrian Prows, MD;  Location: [facility];  Service: Cardiovascular;  Laterality: N/A;  . MUSCLE BIOPSY Right 05/06/2018   Procedure: right vastus lateralis muscle biopsy;  Surgeon: Earnie Larsson, MD;  Location: [facility];  Service: Neurosurgery;  Laterality: Right;  . ORIF ANKLE FRACTURE Right 10/02/2012   Procedure: OPEN REDUCTION INTERNAL FIXATION (ORIF) ANKLE FRACTURE;  Surgeon: Meredith Pel, MD;  Location: [facility];  Service: Orthopedics;  Laterality: Right;  . PERIPHERAL VASCULAR ATHERECTOMY Left 06/12/2016   Procedure: Peripheral Vascular Atherectomy-Left Popliteal;  Surgeon: Adrian Prows, MD;  Location: [facility];  Service: Cardiovascular;  Laterality: Left;  . PERIPHERAL VASCULAR INTERVENTION Left 06/12/2016   Procedure: Peripheral Vascular Intervention- DCB Left Popliteal;  Surgeon: Adrian Prows, MD;  Location: [facility];  Service: Cardiovascular;  Laterality: Left;   Social History   Tobacco Use  . Smoking status: Former Smoker    Packs/day: 1.00    Years: 20.00    Pack years: 20.00    Quit date: 08/22/1981    Years since quitting: 38.2  . Smokeless tobacco: Never Used  Substance Use Topics  . Alcohol use: No    Alcohol/week: 0.0 standard drinks    ROS  Review of Systems  Constitutional: Positive for malaise/fatigue.  Cardiovascular: Positive for claudication, dyspnea on exertion and leg swelling.  Musculoskeletal: Positive for joint pain.  Gastrointestinal: Negative for melena.  Neurological: Positive for paresthesias (feet) and weakness.   Objective  Blood  pressure 120/63, pulse 84, resp. rate 15, height 5\' 11"  (1.803 m), weight 268 lb (121.6 kg), SpO2 96 %.  Vitals with BMI 11/09/2019 10/30/2019 08/10/2019  Height 5\' 11"  5\' 11"  5\' 11"   Weight 268 lbs 269 lbs 6 oz 264 lbs  BMI 37.39 97.67 34.19  Systolic 379 024 097  Diastolic 63 62 70  Pulse 84 68 81     Physical Exam Constitutional:      Comments: Morbidly obese in no acute distress.  Cardiovascular:     Rate and Rhythm: Normal  rate and regular rhythm.     Pulses:          Carotid pulses are 2+ on the right side and 2+ on the left side with bruit.      Femoral pulses are 2+ on the right side and 2+ on the left side.      Popliteal pulses are 0 on the right side and 0 on the left side.       Dorsalis pedis pulses are 2+ on the right side and 0 on the left side.       Posterior tibial pulses are 0 on the right side and 0 on the left side.     Heart sounds: Normal heart sounds. No murmur heard.  No gallop.      Comments: 1-2 + bilateral pitting ankle edema. JVD difficult to see due to short neck. Left leg cooler than right. Capillary refill normal.  Pulmonary:     Effort: Pulmonary effort is normal.     Breath sounds: Normal breath sounds.  Abdominal:     General: Bowel sounds are normal.     Palpations: Abdomen is soft.     Comments: Obese. Pannus present    Laboratory examination:   Recent Labs    12/12/18 0850 12/15/18 0816 07/13/19 0833  NA 135 135 141  K 4.3 4.1 4.2  CL 97* 97* 102  CO2 28 27 27   GLUCOSE 200* 247* 100*  BUN 25* 27* 14  CREATININE 1.36* 1.44* 1.47*  CALCIUM 8.7* 9.1 9.1  GFRNONAA 50* 47* 46*  GFRAA 58* 54* 53*   CrCl cannot be calculated (Patient's most recent lab result is older than the maximum 21 days allowed.).  CMP Latest Ref Rng & Units 07/13/2019 12/15/2018 12/12/2018  Glucose 65 - 99 mg/dL 100(H) 247(H) 200(H)  BUN 8 - 27 mg/dL 14 27(H) 25(H)  Creatinine 0.76 - 1.27 mg/dL 1.47(H) 1.44(H) 1.36(H)  Sodium 134 - 144 mmol/L 141 135 135  Potassium 3.5 - 5.2 mmol/L 4.2 4.1 4.3  Chloride 96 - 106 mmol/L 102 97(L) 97(L)  CO2 20 - 29 mmol/L 27 27 28   Calcium 8.6 - 10.2 mg/dL 9.1 9.1 8.7(L)  Total Protein 6.0 - 8.5 g/dL 6.1 - 5.1(L)  Total Bilirubin 0.0 - 1.2 mg/dL 0.3 - 0.7  Alkaline Phos 39 - 117 IU/L 112 - 52  AST 0 - 40 IU/L 26 - 25  ALT 0 - 44 IU/L 21 - 24   CBC Latest Ref Rng & Units 12/15/2018 12/12/2018 12/08/2018  WBC 4.0 - 10.5 K/uL 4.9 4.0 3.4(L)  Hemoglobin 13.0  - 17.0 g/dL 13.8 12.6(L) 13.8  Hematocrit 39 - 52 % 41.1 37.6(L) 41.0  Platelets 150 - 400 K/uL 226 185 184   Lipid Panel Recent Labs    07/13/19 0833  CHOL 182  TRIG 99  LDLCALC 118*  HDL 46    HEMOGLOBIN A1C Lab Results  Component Value Date  HGBA1C 7.3 (H) 11/08/2018   MPG 162.81 11/08/2018   TSH Recent Labs    07/13/19 0833  TSH 3.910    Ref Range & Units 3 mo ago  (07/13/19) 1 yr ago  (11/09/18) 1 yr ago  (11/08/18) 1 yr ago  (12/11/17)  Total CK 41.0 - 331.0 U/L 534High Panic  422High R, CM  777High R, CM  750High Panic R     External labs 02/03/2019:   Cholesterol, total 181.000 m 02/03/2019 HDL 54 MG/DL 02/03/2019 LDL 112.000 m 02/03/2019 Triglycerides 73.000 02/03/2019  A1C 7.000 % 02/03/2019 Hemoglobin 13.800 g/ 02/03/2019, INR 0.900 06/05/2016 Platelets 185.000 12/12/2018  Creatinine, Serum 1.500 mg/ 02/03/2019 Potassium 4.300 12/12/2018 ALT (SGPT) 18.000 uni 02/03/2019 TSH 1.700 02/03/2019  BNP 16.600 PG 04/17/2017  Medications and allergies   Allergies  Allergen Reactions  . Metformin And Related   . Simbrinza [Brinzolamide-Brimonidine]     Drainage, redness to eyes     Current Outpatient Medications  Medication Instructions  . albuterol (VENTOLIN HFA) 108 (90 Base) MCG/ACT inhaler 2 puffs, Inhalation, Every 4 hours PRN  . ALPRAZolam (XANAX) 0.5 MG tablet TK 1 T PO HS  . ascorbic acid (VITAMIN C) 500 mg, Oral, Daily  . aspirin 81 mg, Oral, Daily at bedtime  . calcium carbonate (CALCIUM 600) 1,200 mg, Oral, Daily  . cilostazol (PLETAL) 100 MG tablet TAKE 1 TABLET(100 MG) BY MOUTH TWICE DAILY  . ezetimibe (ZETIA) 10 mg, Oral, Daily after supper  . famotidine (PEPCID) 20 mg, Oral, Daily at bedtime  . folic acid (FOLVITE) 1 mg, Oral, Daily  . furosemide (LASIX) 40 mg, Oral, Daily  . gabapentin (NEURONTIN) 600 mg, Oral, 3 times daily  . insulin aspart protamine - aspart (NOVOLOG MIX 70/30 FLEXPEN) (70-30) 100 UNIT/ML FlexPen 36 Units,  Subcutaneous, Daily with breakfast  . latanoprost (XALATAN) 0.005 % ophthalmic solution 1 drop, Daily at bedtime  . losartan (COZAAR) 25 MG tablet TAKE 1 TABLET(25 MG) BY MOUTH DAILY  . magnesium gluconate (MAGONATE) 500 mg, Oral, Daily  . metoprolol tartrate (LOPRESSOR) 25 mg, Oral, 2 times daily  . Multiple Vitamin (MULTIVITAMIN) capsule 1 capsule, Oral, Daily  . oxybutynin (DITROPAN) 5 mg, Oral, 2 times daily  . potassium chloride SA (K-DUR) 20 MEQ tablet 20 mEq, Oral, Daily  . rosuvastatin (CRESTOR) 10 mg, Oral, Weekly  . tamsulosin (FLOMAX) 0.4 mg, Oral, Daily  . timolol (TIMOPTIC) 0.5 % ophthalmic solution INT 1 GTT IN EACH EYE BID  . Vitamin D3 10,000 Units, Oral, Daily  . zinc sulfate 220 mg, Oral, Daily    Radiology:  No results found. BMP nodule Cardiac Studies:   Peripheral arteriogram 06/12/2016: Left SFA calcific 100% to 0% with CSI atherectomy and Drug coated stenting with 6.0x120 mm Zilver PTX. Left AT occluded prox and reconstitutes at ankle.  Lower Extremity Arterial Duplex 06/24/2019:  No hemodynamically significant stenoses are identified in the lower  extremity arterial system. There is diffuse small vessel disease below  knee bilaterally. Left SFA and popliteal artery angioplasty site is  patent.  This exam reveals moderately decreased perfusion of the right lower  extremity, noted at the anterior tibial and post tibial artery level (ABI  0.77) with severely abnormal waveform and mildly decreased perfusion of  the left lower extremity, noted at the anterior tibial and post tibial  artery level (ABI 0.85) with moderately abnormal waveform.  No significant change from 04/11/2017.  LexiscanTetrofosmin Stress Test  06/15/2019: Nondiagnostic ECG stress. Myocardial perfusion is normal. Stress LV  EF: 59%.  No change from 05/14/2013. Low risk study.   Echocardiogram 06/24/2019:  Study Quality: Technically Difficult  Normal LV systolic function with visual EF  60-65%. Left ventricle cavity  is normal in size. Moderate left ventricular hypertrophy. Normal global  wall motion. Indeterminate diastolic filling pattern, elevated LAP. No obvious regional wall motion abnormalities. Calculated EF 69%.  Mild mitral regurgitation. Mild calcification of the mitral valve  annulus. No other significant valvular abnormalities.  Prior study dated 05/03/2016 Mild concentric hypertrophy of the left ventricle.  Normal global wall motion. Grade II diastolic dysfunction, elevated LAP.   Assessment     ICD-10-CM   1. Hypercholesteremia  E78.00 ezetimibe (ZETIA) 10 MG tablet    rosuvastatin (CRESTOR) 10 MG tablet    Lipid Panel With LDL/HDL Ratio  2. Non-traumatic rhabdomyolysis  M62.82   3. Claudication in peripheral vascular disease (HCC)  I73.9   4. Essential hypertension  I09 Basic metabolic panel  5. Muscle weakness: Inclusion body myositis  M62.81 CK    EKG 06/11/2019: Sinus rhythm with first-degree block rate of 77 bpm, left atrial enlargement, left axis deviation, incomplete right bundle branch block.  Poor R wave progression, cannot exclude anteroseptal infarct old.  No evidence of ischemia.   Compared to 04/16/2017, first-degree AV block new.  Meds ordered this encounter  Medications  . ezetimibe (ZETIA) 10 MG tablet    Sig: Take 1 tablet (10 mg total) by mouth daily after supper.    Dispense:  30 tablet    Refill:  2    Medications Discontinued During This Encounter  Medication Reason  . rosuvastatin (CRESTOR) 10 MG tablet      Recommendations:   Andrew Larsen  is a 79 y.o. African-American male with hypertension, hyperlipidemia, uncontrolled diabetes mellitus with peripheral arterial disease and history of angioplasty to left SFA in 2018, obesity, presents here for reevaluation of peripheral arterial disease and dyspnea on exertion.  Patient is admitted with Covid pneumonia on 11/03/2018 and discharged to rehabilitation on 12/01/2018 and eventually  home on 12/22/2018.  Since then he has noticed marked dyspnea, fatigue and also bilateral lower extremity weakness now he is walking with a walker.  Previously was walking with a cane.  He also has a diagnosis of inclusion body myositis at [facility] and has a positive NT5C1A antibody.  This is a 53-month office visit and follow-up, he continues to have marked weakness in his legs and gait instability.  On his last office visit I started him on Crestor 10 mg daily.  He is tolerating this and has not noticed any additional weakness in the legs.  He is now also been closely followed by pulmonary medicine and is being worked up for connective tissue disease and IPF.  His bilateral leg weakness could be from critical illness neuropathy and also related to severe small vessel disease related to DM. With regard to his lipids, although was approved for Repatha, due to cost, he has not been taking it. LDL is not at goal in view of inclusion body myositis, elevated total CK enzymes I have discontinued Crestor daily and changed to once a week and added Zetia 10 mg daily.  I will obtain lipid profile testing, BMP and CK enzymes in 2 months and see him back in 3 months for follow-up.  Dyspnea is related to deconditioning, obesity hypoventilation.  Encouraged him to continue to lose weight and exercise regularly, low-salt diet also discussed.BP is now well controlled and tolerating Losartan.  Adrian Prows, MD, [facility] 11/09/2019, 10:01 PM Office: [phone]   CC: Marshell Garfinkel, MD

## 2019-11-17 LAB — MYOMARKER 3 PLUS PROFILE (RDL)

## 2019-12-30 IMAGING — DX PORTABLE CHEST - 1 VIEW
1 series · 1 of 1 positions shown · non-contrast
Comparison: 11/19/2018.

CLINICAL DATA: PICC line placement.

EXAM:
PORTABLE CHEST 1 VIEW

[chest]
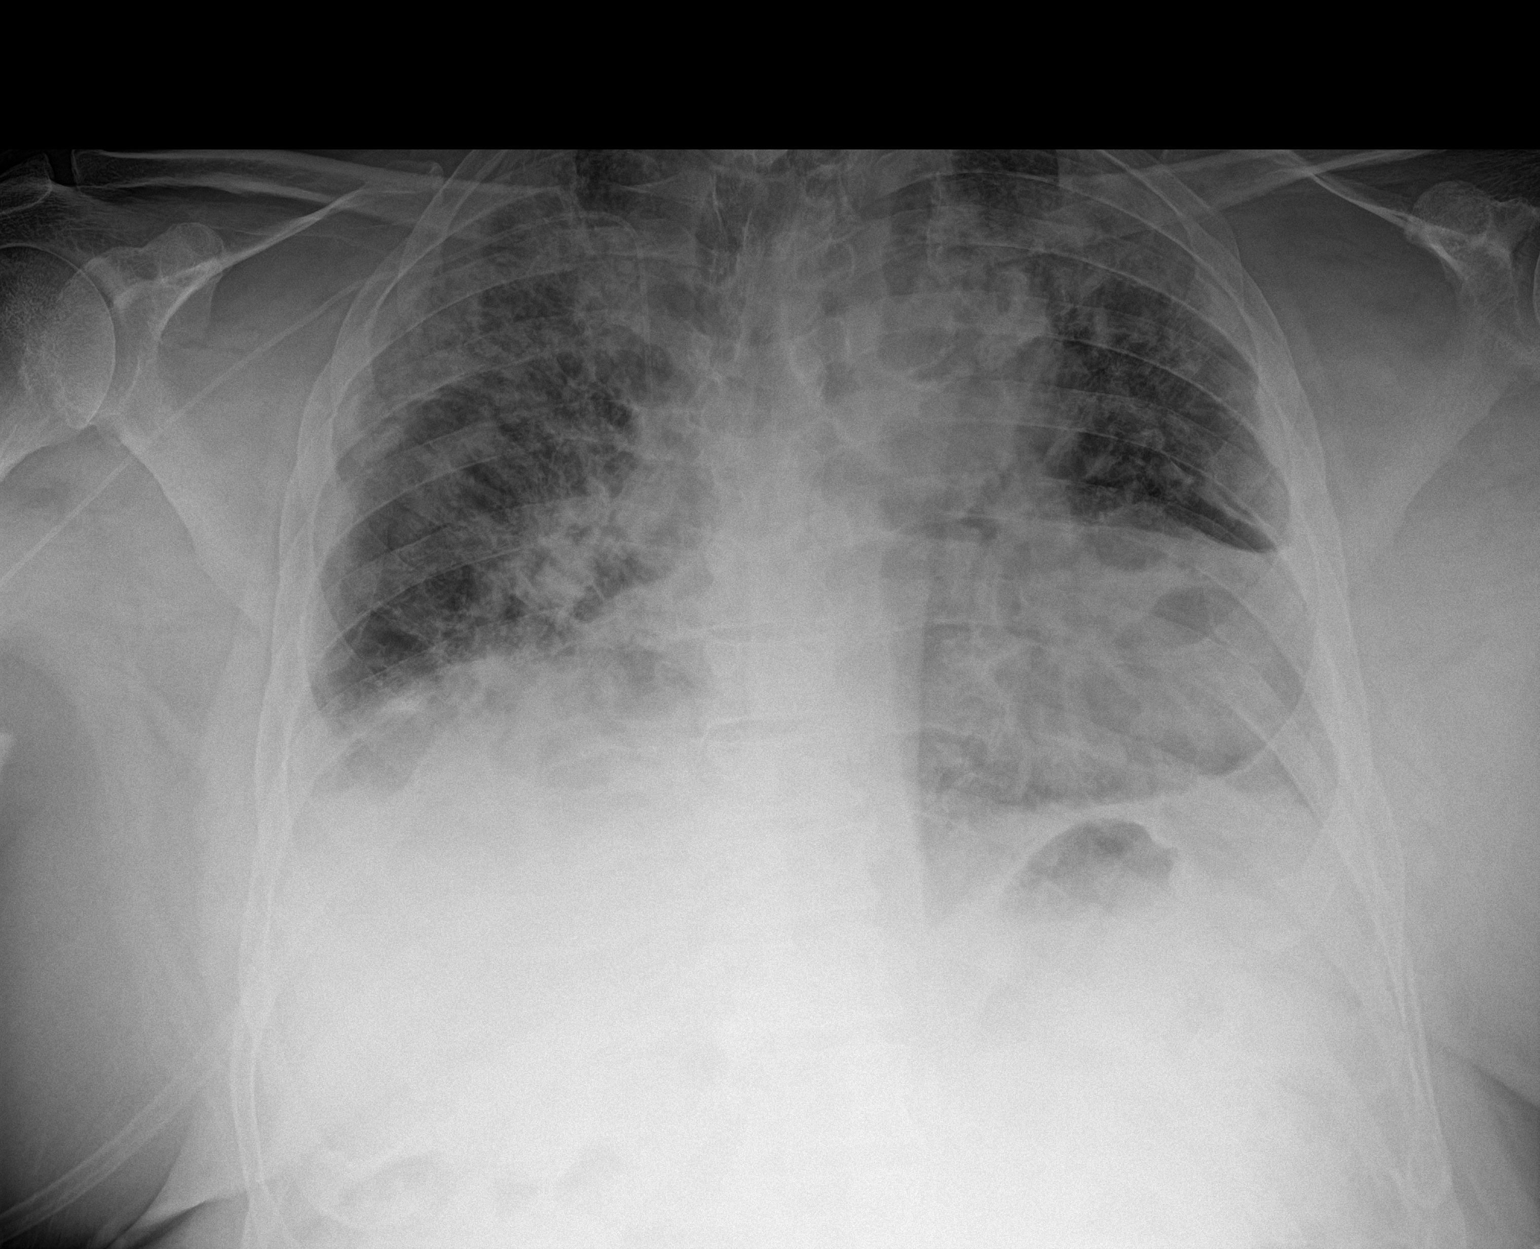

[1 of 1 positions shown; findings below may reference images not displayed]

FINDINGS: Interval removal of left IJ line. Right PICC line noted with tip
over region of superior vena cava. Stable cardiomegaly. Diffuse
bilateral airspace disease again noted. Small bilateral pleural
effusions noted. No pneumothorax.
IMPRESSION: 1. Interim removal of left IJ line. Right PICC line noted with tip
over the region of superior vena cava.

2.  Stable cardiomegaly.

3. Bilateral airspace disease again noted without interim change.
Small bilateral pleural effusions noted on today's exam.

## 2020-01-05 IMAGING — DX CHEST - 2 VIEW
2 series · 2 of 2 positions shown · non-contrast
Comparison: Radiograph 11/28/2018

CLINICAL DATA: Shortness of breath, [id number] positive

EXAM:
CHEST - 2 VIEW

[chest lat]
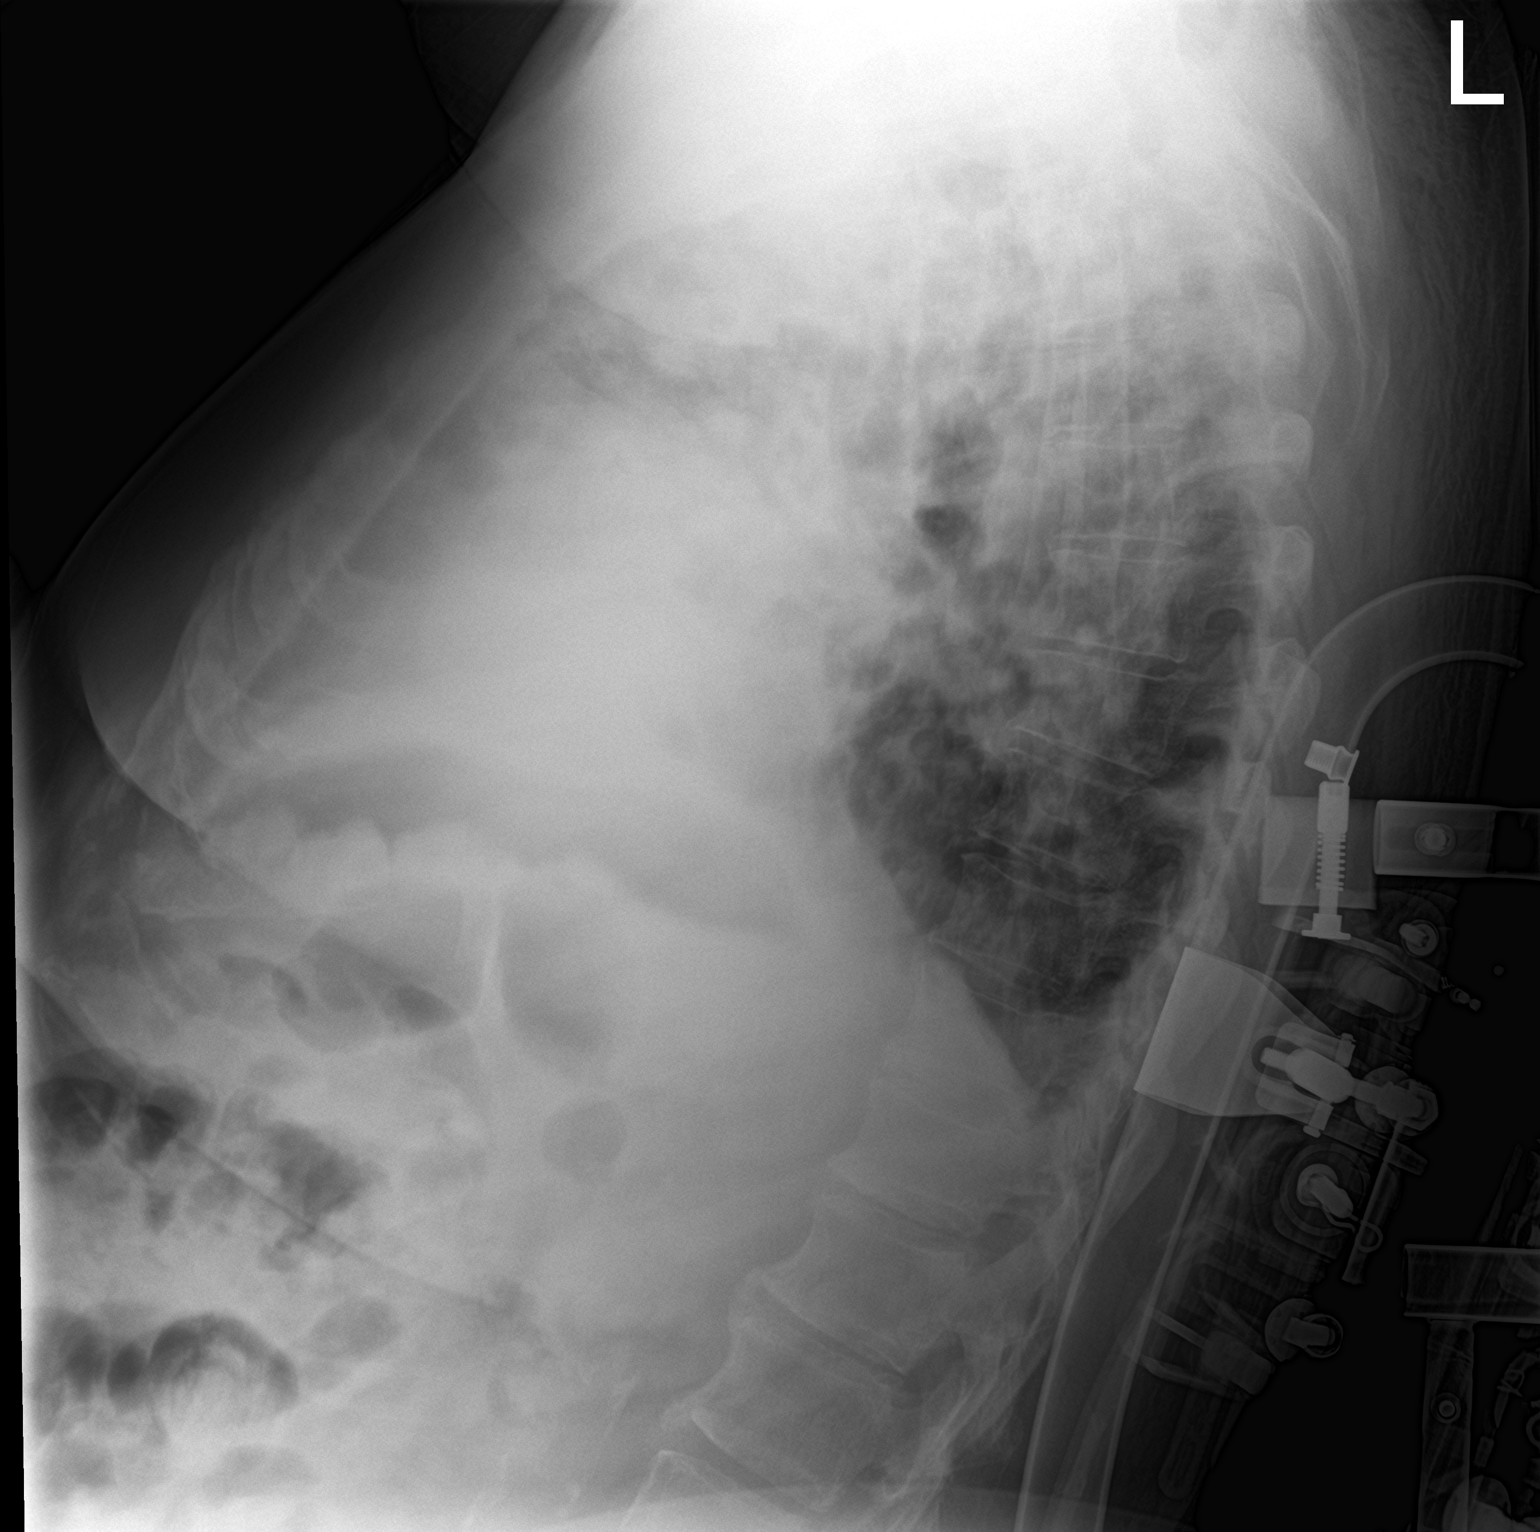

[chest ap]
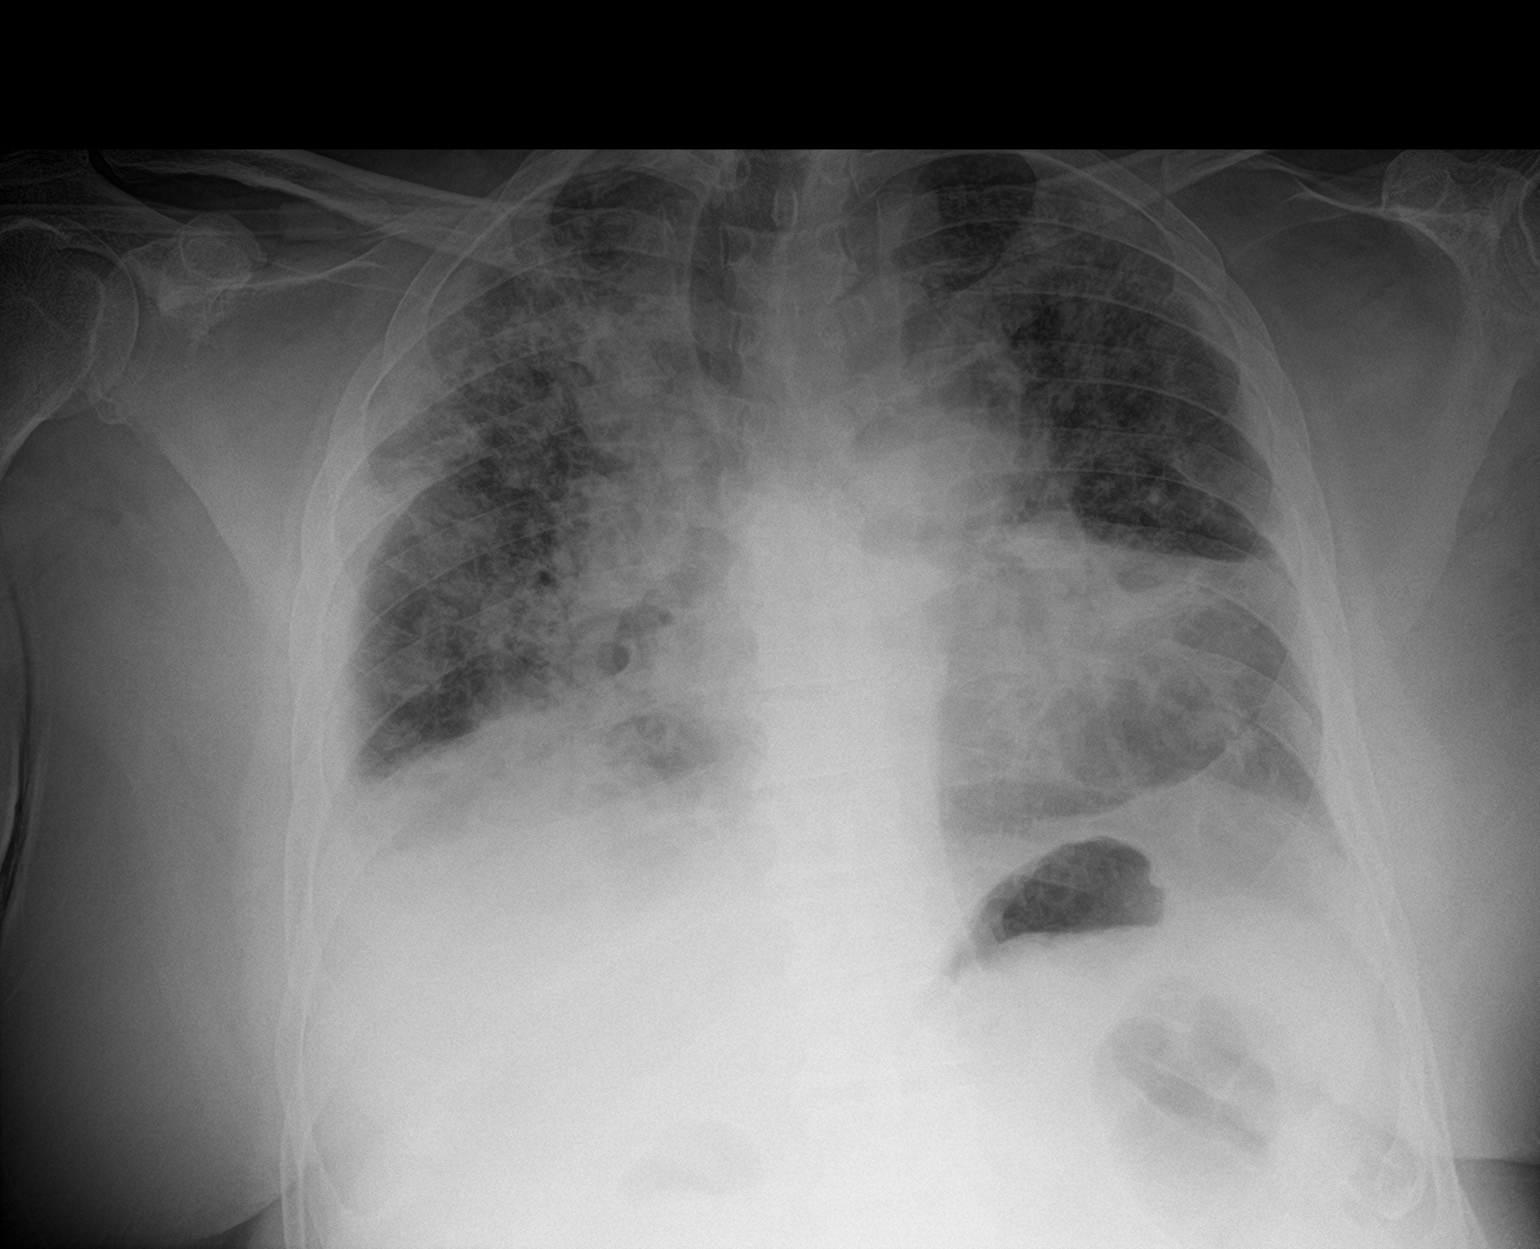

[2 of 2 positions shown; findings below may reference images not displayed]

FINDINGS: Worsening diffuse bilateral airspace disease. Small bilateral
effusions, right greater than left. No pneumothorax. Stable
cardiomegaly. No acute osseous or soft tissue abnormality.
Degenerative changes are present in the imaged spine and shoulders.
IMPRESSION: Worsening diffuse bilateral airspace disease and small bilateral
effusions.

## 2020-01-09 IMAGING — CR CHEST - 2 VIEW
2 series · 2 of 2 positions shown · non-contrast
Comparison: 12/04/2018

CLINICAL DATA: Shortness of breath. Cough. [id number] viral
infection.

EXAM:
CHEST - 2 VIEW

[chest pa]
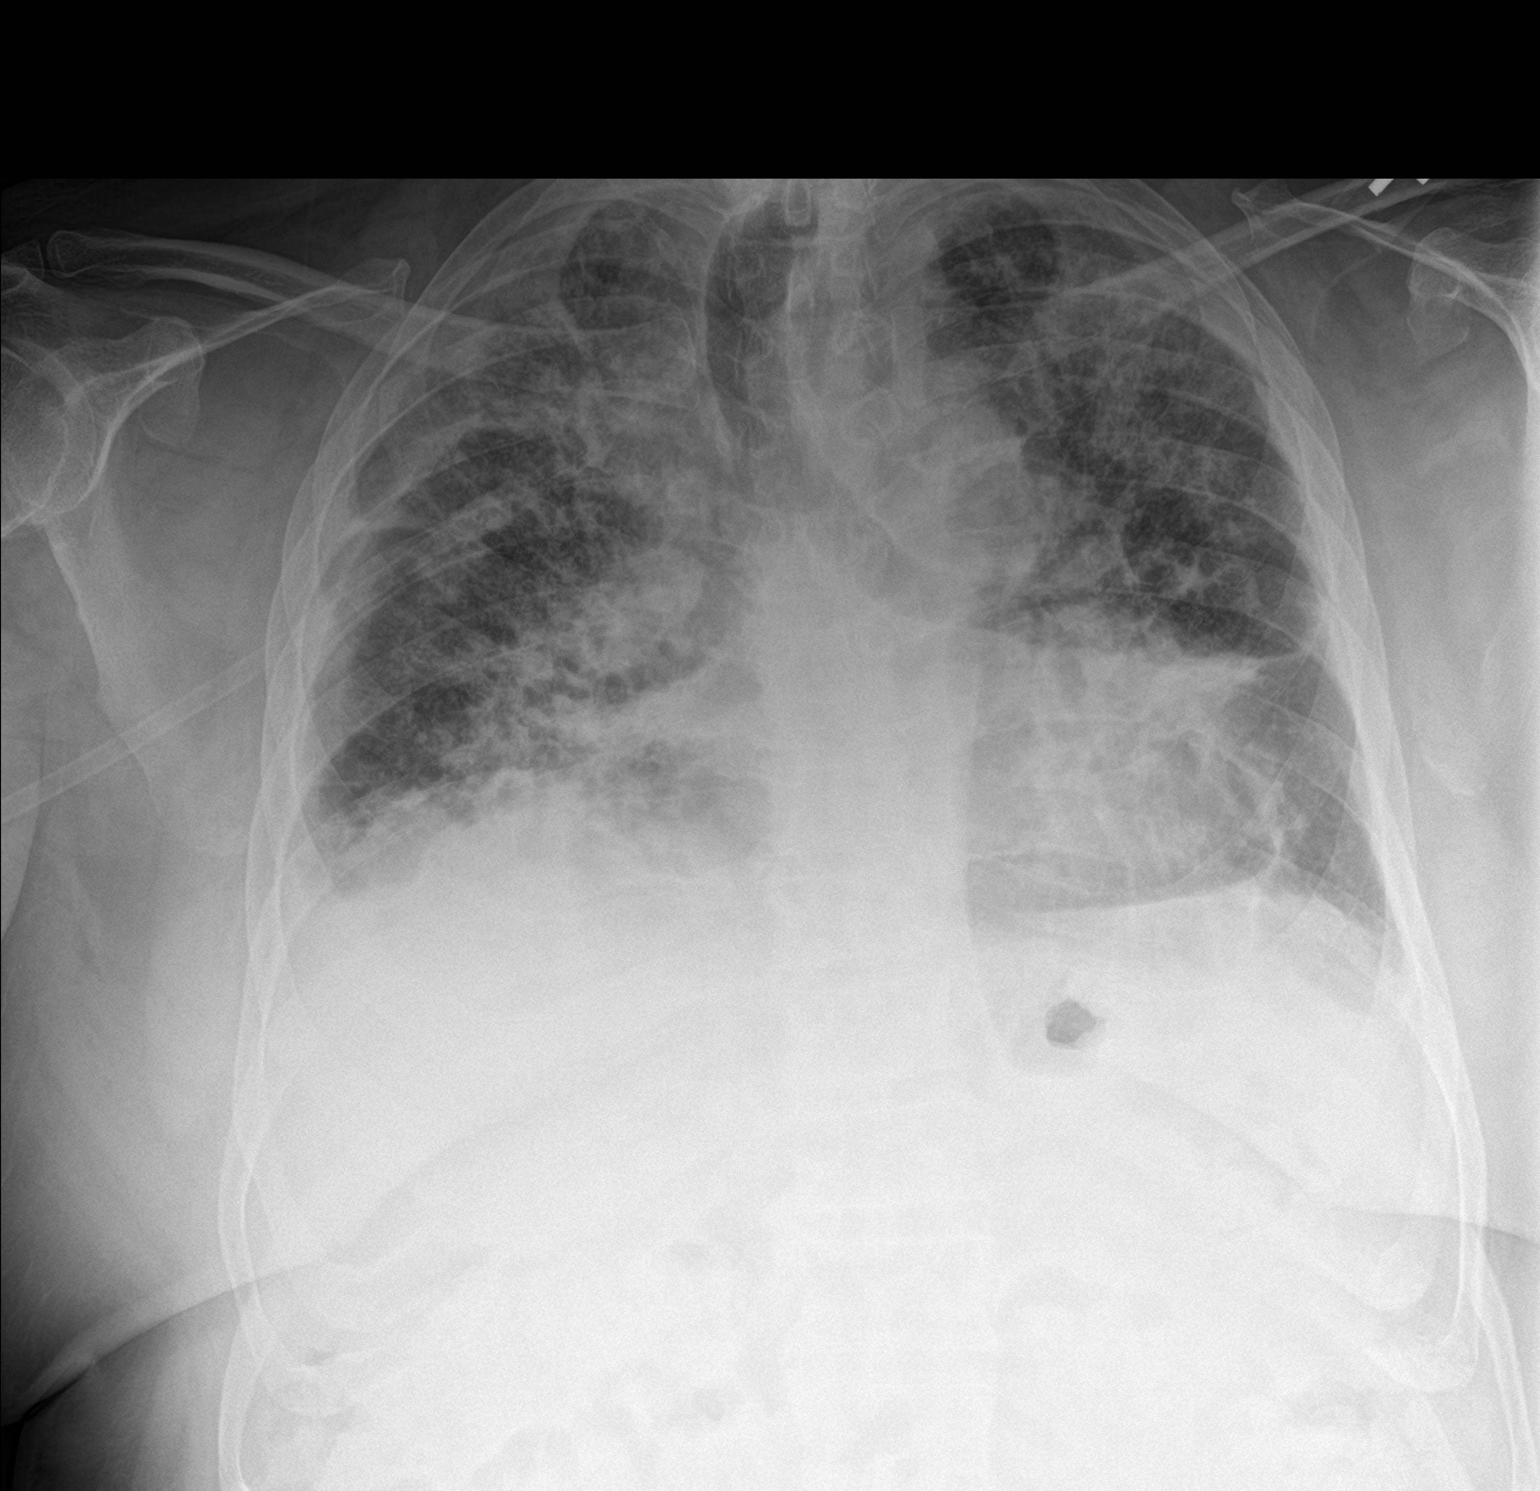

[chest lat]
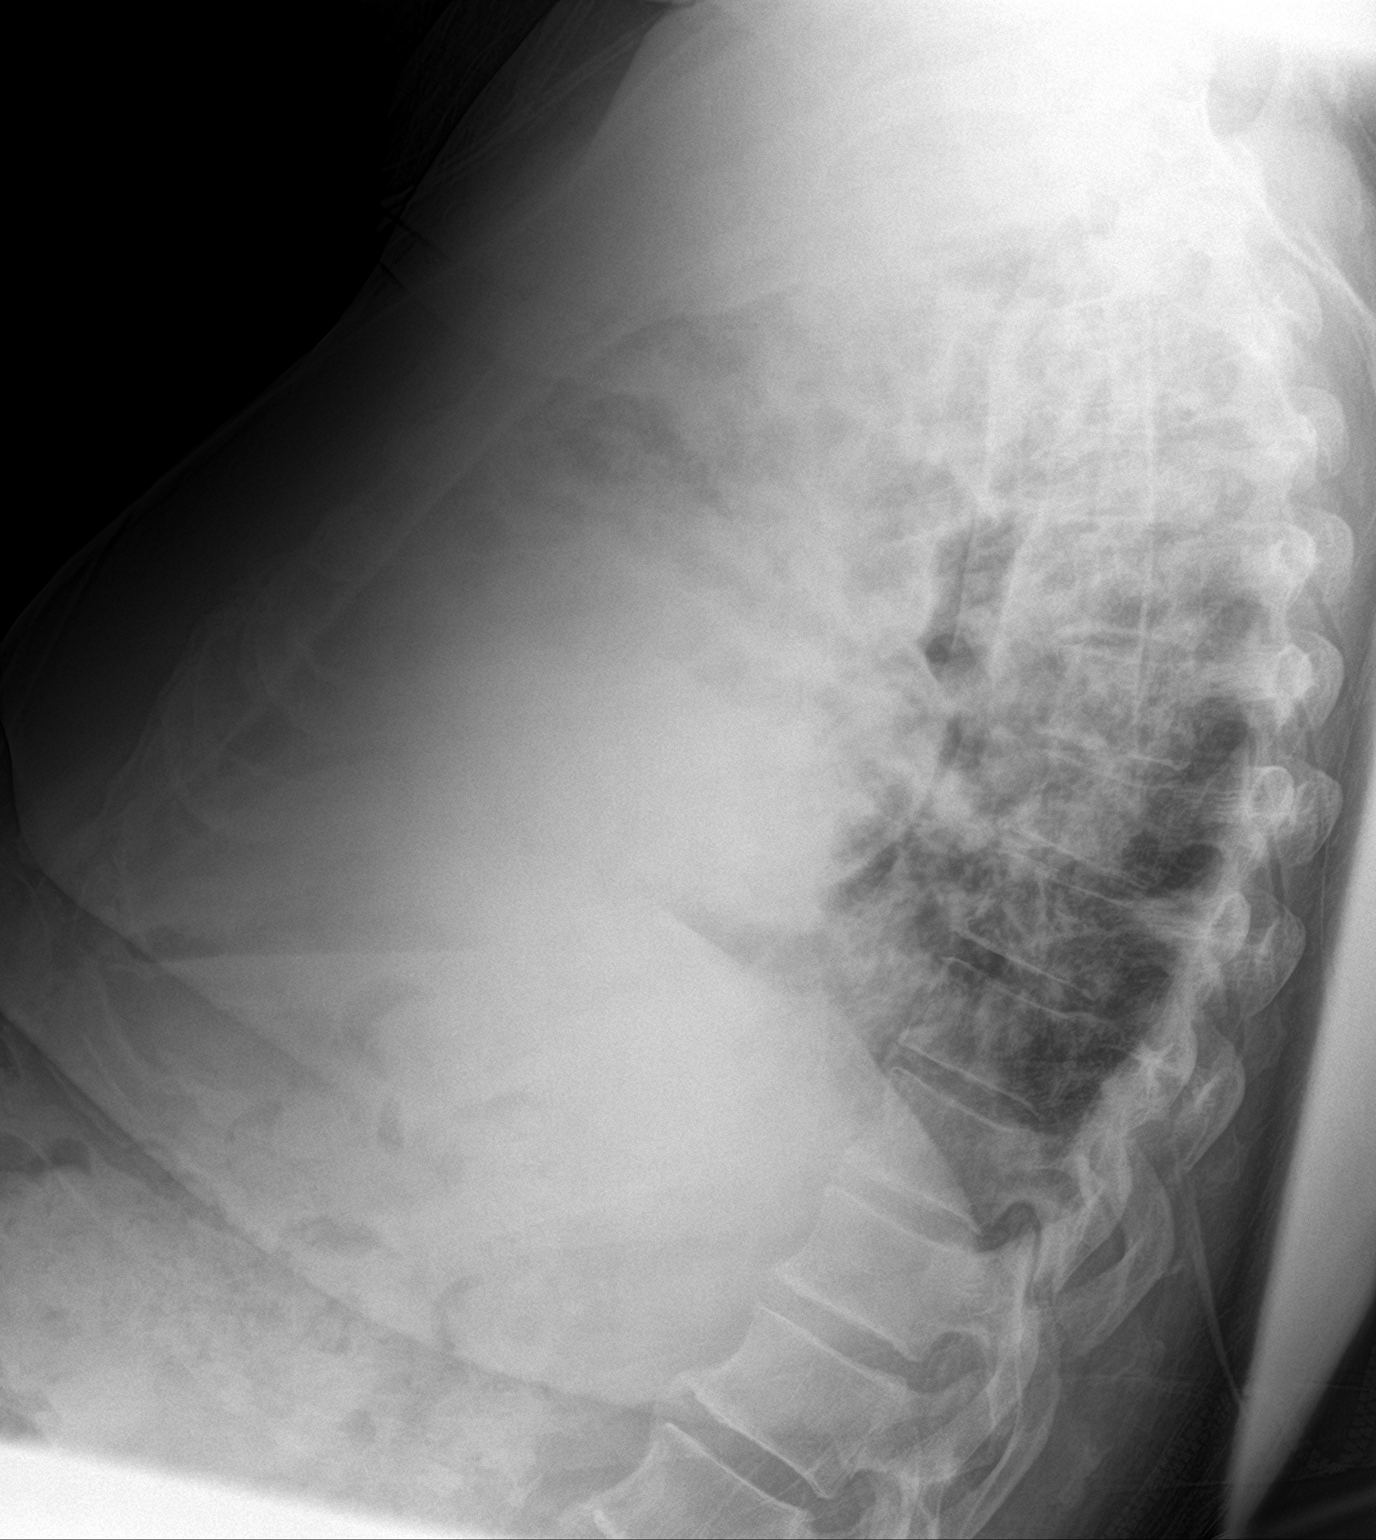

[2 of 2 positions shown; findings below may reference images not displayed]

FINDINGS: Heterogeneous bilateral pulmonary airspace disease shows no
significant change compared to previous study. Heart size is stable.
No evidence of pneumothorax or significant pleural effusion.
IMPRESSION: No significant change in heterogeneous bilateral pulmonary airspace
disease.

## 2020-02-02 ENCOUNTER — Telehealth: Payer: Self-pay | Admitting: Adult Health

## 2020-02-02 NOTE — Telephone Encounter (Signed)
Pt's daughter, [name] (on [place]) called, Pt has been falling a lot. Who do I need to speak with to refer him to physical and occupational therapy? Would like a call from the nurse.

## 2020-02-02 NOTE — Telephone Encounter (Signed)
I called and spoke to wife of pt.  Pt has had increase muscle weakness and falls.  Requested to see Dr. Rexene Alberts, made appt 02-15-20 at 1130.  Requesting PT/OT.  Has appt 03-21-20 with MM/NP for cpap will not cancel, unless Dr. Rexene Alberts address cpap as well.  Did go to [facility] diagnosed with inclusion body myositis, nothing but therapay recommended, but came down with covid last year and was not addressed after that.

## 2020-02-05 ENCOUNTER — Other Ambulatory Visit: Payer: Self-pay | Admitting: Cardiology

## 2020-02-05 DIAGNOSIS — E7800 Pure hypercholesterolemia, unspecified: Secondary | ICD-10-CM

## 2020-02-09 ENCOUNTER — Ambulatory Visit: Payer: Medicare Other | Admitting: Cardiology

## 2020-02-15 ENCOUNTER — Encounter: Payer: Self-pay | Admitting: Neurology

## 2020-02-15 ENCOUNTER — Ambulatory Visit: Payer: Medicare Other | Admitting: Neurology

## 2020-02-15 VITALS — BP 134/66 | HR 67 | Ht 71.0 in | Wt 267.3 lb

## 2020-02-15 DIAGNOSIS — G7241 Inclusion body myositis [IBM]: Secondary | ICD-10-CM | POA: Diagnosis not present

## 2020-02-15 DIAGNOSIS — G4733 Obstructive sleep apnea (adult) (pediatric): Secondary | ICD-10-CM

## 2020-02-15 DIAGNOSIS — R29898 Other symptoms and signs involving the musculoskeletal system: Secondary | ICD-10-CM | POA: Diagnosis not present

## 2020-02-15 DIAGNOSIS — Z9989 Dependence on other enabling machines and devices: Secondary | ICD-10-CM | POA: Diagnosis not present

## 2020-02-15 NOTE — Patient Instructions (Addendum)
It was good to see you both today.  Please use your walker at all times and I made a referral to physical therapy at [facility].  Please get in touch with [facility] regarding getting a replacement CPAP machine.  I placed an order for a new CPAP machine and also a referral to physical therapy.  Please follow-up to see Ward Givens in 6 months.  Please also call [facility] and see if you can make a follow-up appointment with your specialist there, Dr. Warnell Forester.

## 2020-02-15 NOTE — Progress Notes (Signed)
Subjective:    Patient ID: Andrew Larsen is a 79 y.o. male.  HPI     Interim history:   Andrew Larsen is a 79 year old right-handed gentleman with an underlying complex medical history of peripheral vascular disease, hypertension, hyperlipidemia, glaucoma, reflux disease, bilateral ankle fractures with status post surgeries, arthroscopic right knee surgery and status post left femoral stent placement in February 2018, diabetes, chronic cough, prior smoking, and obesity, leg weakness and neuropathy, recent Dx of inclusion body myositis (through [facility]), and OSA, who presents for follow up consultation of his falls and weakness. The patient is accompanied by today. I last saw him on 09/16/18 in a virtual visit, at which time he was compliant with CPAP.  He saw Ward Givens in the interim on 03/19/19.   Today 02/15/20: He reports more weakness in his legs.  He fell a few weeks ago as he lost his balance but he also was not using his walker.  He has been using a 2 wheeled walker consistently.  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  Unfortunately, he contracted COVID-19 and was hospitalized from November 03 2018 through August third 2020 and subsequent inpatient rehab from August third 2020 through August 24th 2020.  He is followed by cardiology and pulmonology.  He has not seen his [place] neurologist in several months.  He would like to get some outpatient physical therapy.  He has not used his CPAP machine after he found out that his machine was on recall.  He has registered his machine on the website but has not talked to his DME company about a replacement yet.  He has used an ozone based cleaning machine on his CPAP machine and is advised to no longer use it for now until we have a better sense for when he gets a replacement machine.  He does admit that he does not sleep as well without his machine.   The patient's allergies, current medications, family history, past medical  history, past social history, past surgical history and problem list were reviewed and updated as appropriate.    Previously (copied from previous notes for reference):    I saw him on 03/18/2018, at which time we talked about his EMG and nerve conduction test results.  We also talked about his sleep study results.  He was compliant with CPAP, AHI was suboptimal around 15/h for his residual sleep disordered breathing.  He had some falls in the interim.  He increased his gabapentin.  He had some elevated blood pressure values.  I suggested we increase his CPAP pressure to 13 cm.  We also proceeded with a referral for muscle biopsy for concern for myopathy. I reviewed his muscle biopsy report in the interim in February 2020 indicating some evidence of possible inflammatory myopathy and neurogenic atrophy.  I suggested we proceed with a specialist referral in neuromuscular division and he agreed to a referral to [facility] for this.  I reviewed his CPAP compliance data from 08/16/2018 through 09/14/2018 which is a total of 30 days, during which time he used his machine every night with percent use days greater than 4 hours at 83.3%, indicating very good compliance with an average usage of 5 hours and 15 minutes, residual AHI suboptimal but improved from before at 10.1/h, leak on the low side, pressure at 13 cm.      I first met him on 12/11/2017 at the request of his endocrinologist, and which time he reported weakness  in his legs and dizzy spells and recurrent falls. He also endorsed symptoms of sleep apnea with witnessed apneas reported and snoring. I suggested we proceed with an EMG and nerve conduction study as well as sleep testing. EMG and nerve conduction testing from 12/16/2017 showed: IMPRESSION:   Nerve conduction studies done on both lower extremities shows evidence of a primarily axonal peripheral neuropathy of mild to moderate severity.  EMG evaluations on the lower extremities show evidence of mild  proximal myopathic changes, distally there appears to be acute denervation with lower motor unit amplitudes but decreased recruitment.  This would suggest some degree of neuropathic denervation distally as well.  If a muscle biopsy is recommended, would suggest the left vastus lateralis muscle.   He was called with his test results and advised to discuss with his PCP the possibility of stopping his cholesterol medication and I suggested we proceed with a muscle biopsy next. He was referred to neurosurgery for this. Level was elevated and he was advised to consider coming off of his cholesterol medication for that reason.   He had a split-night sleep study on 12/22/2017 and I reviewed the results with him in detail today. Baseline sleep efficiency was 83.6%, sleep latency 3.5 minutes, REM latency 93 minutes. His total AHI was highly elevated at 79.4 per hour. He had absence of slow-wave sleep in the first part of the study. Average oxygen saturation was 98%, nadir was 70%. He had no significant PLMS. He had four bathroom breaks for the night. He was fitted with a full face mask and CPAP was titrated to 11 cm. On the final pressure his AHI was 4.3 per hour with nonsupine REM sleep achieved and O2 nadir 87%. He had severe PLMS during the second part of the study with no significant arousals. He had absence of slow-wave sleep and a REM percentage of 25.6 during the treatment portion of the study. I suggested a home treatment pressure of 12 cm.   I reviewed his CPAP compliance data from 02/15/2018 through 03/16/2018 which is a total of 30 days, during which time he used his CPAP 28 days with percent used days greater than 4 hours at 86.7%, indicating very good compliance with an average usage of 5 hours and 24 minutes, residual AHI elevated at 15.8 per hour, leak on the low side, CPAP pressure of 12 cm.    12/11/2017: (He) reports intermittent dizzy spells but more often he feels weaker in his legs particularly  in his thigh muscles. He has fallen. He denies a spinning sensation. He reports pain in both groin areas. Of note, he has arthritis problems and ongoing issues with his right knee as well. He was told that he is hyperextending his right knee. He had arthroscopic surgery to the knee and had surgeries to both ankles after fractures. He has gained weight. I reviewed your office note from 11/06/2017, which you kindly included. His A1c from December 2018 was 7.0. Most recent A1c was 6 per your office note. He had a sleep study many years ago but is not sure what it showed. In 2017 he was referred to me for sleep evaluation but did not decide to go through with the consultation. He would be willing to get tested for sleep apnea. He does not sleep well. He snores and has witnessed apneas. He is widowed and lives with his only daughter. He quit smoking in the 80s and does not utilize alcohol on a regular basis, drinks caffeine in  the form of coffee, 3-4 times per week, not daily. His brother may have sleep apnea. He has occasional morning headaches and reports nocturia about 3 times per average night. He has significant lower extremity swelling. He takes Lasix as needed for this. He reports tingling and burning sensation in both feet, particularly on the bottom of both feet, he carries a diagnosis of diabetic neuropathy.   His Past Medical History Is Significant For: Past Medical History:  Diagnosis Date  . CKD (chronic kidney disease)    CKD stage III (01/2018)  . Daytime somnolence   . Diabetes mellitus without complication ([place])   . Fracture    right fibula/wears boot cast  . GERD (gastroesophageal reflux disease)   . Glaucoma   . Hypercholesteremia   . Hyperlipidemia   . Hypertension   . Mold exposure   . PAD (peripheral artery disease) ([place])   . Sleep apnea     His Past Surgical History Is Significant For: Past Surgical History:  Procedure Laterality Date  . LOWER EXTREMITY ANGIOGRAPHY N/A  06/12/2016   Procedure: Lower Extremity Angiography;  Surgeon: Adrian Prows, MD;  Location: [facility];  Service: Cardiovascular;  Laterality: N/A;  . MUSCLE BIOPSY Right 05/06/2018   Procedure: right vastus lateralis muscle biopsy;  Surgeon: Earnie Larsson, MD;  Location: [facility];  Service: Neurosurgery;  Laterality: Right;  . ORIF ANKLE FRACTURE Right 10/02/2012   Procedure: OPEN REDUCTION INTERNAL FIXATION (ORIF) ANKLE FRACTURE;  Surgeon: Meredith Pel, MD;  Location: [facility];  Service: Orthopedics;  Laterality: Right;  . PERIPHERAL VASCULAR ATHERECTOMY Left 06/12/2016   Procedure: Peripheral Vascular Atherectomy-Left Popliteal;  Surgeon: Adrian Prows, MD;  Location: [facility];  Service: Cardiovascular;  Laterality: Left;  . PERIPHERAL VASCULAR INTERVENTION Left 06/12/2016   Procedure: Peripheral Vascular Intervention- DCB Left Popliteal;  Surgeon: Adrian Prows, MD;  Location: [facility];  Service: Cardiovascular;  Laterality: Left;    His Family History Is Significant For: Family History  Problem Relation Age of Onset  . Heart disease Father   . Prostate cancer Brother   . Cancer Brother   . Diabetes Brother   . Colon cancer Neg Hx     His Social History Is Significant For: Social History   Socioeconomic History  . Marital status: Widowed    Spouse name: Not on file  . Number of children: 1  . Years of education: Not on file  . Highest education level: Not on file  Occupational History  . Not on file  Tobacco Use  . Smoking status: Former Smoker    Packs/day: 1.00    Years: 20.00    Pack years: 20.00    Quit date: 08/22/1981    Years since quitting: 38.5  . Smokeless tobacco: Never Used  Vaping Use  . Vaping Use: Never used  Substance and Sexual Activity  . Alcohol use: No    Alcohol/week: 0.0 standard drinks  . Drug use: No  . Sexual activity: Never  Other Topics Concern  . Not on file  Social History Narrative   ** Merged History Encounter **        Social Determinants of Health   Financial Resource Strain:   . Difficulty of Paying Living Expenses: Not on file  Food Insecurity:   . Worried About Charity fundraiser in the Last Year: Not on file  . Ran Out of Food in the Last Year: Not on file  Transportation Needs:   .  Lack of Transportation (Medical): Not on file  . Lack of Transportation (Non-Medical): Not on file  Physical Activity:   . Days of Exercise per Week: Not on file  . Minutes of Exercise per Session: Not on file  Stress:   . Feeling of Stress : Not on file  Social Connections:   . Frequency of Communication with Friends and Family: Not on file  . Frequency of Social Gatherings with Friends and Family: Not on file  . Attends Religious Services: Not on file  . Active Member of Clubs or Organizations: Not on file  . Attends Archivist Meetings: Not on file  . Marital Status: Not on file    His Allergies Are:  Allergies  Allergen Reactions  . Metformin And Related   . Simbrinza [Brinzolamide-Brimonidine]     Drainage, redness to eyes  :   His Current Medications Are:  Outpatient Encounter Medications as of 02/15/2020  Medication Sig  . albuterol (VENTOLIN HFA) 108 (90 Base) MCG/ACT inhaler Inhale 2 puffs into the lungs every 4 (four) hours as needed for wheezing or shortness of breath.  . ALPRAZolam (XANAX) 0.5 MG tablet TK 1 T PO HS  . aspirin 81 MG chewable tablet Chew 81 mg by mouth at bedtime.   . calcium carbonate (CALCIUM 600) 600 MG TABS tablet Take 2 tablets (1,200 mg total) by mouth daily.  . Cholecalciferol (VITAMIN D3) 5000 units TABS Take 10,000 Units by mouth daily.   . cilostazol (PLETAL) 100 MG tablet TAKE 1 TABLET(100 MG) BY MOUTH TWICE DAILY  . ezetimibe (ZETIA) 10 MG tablet TAKE 1 TABLET(10 MG) BY MOUTH DAILY AFTER SUPPER  . famotidine (PEPCID) 20 MG tablet Take 1 tablet (20 mg total) by mouth at bedtime.  . folic acid (FOLVITE) 1 MG tablet Take 1 tablet (1 mg total) by mouth  daily.  . furosemide (LASIX) 40 MG tablet Take 1 tablet (40 mg total) by mouth daily.  Marland Kitchen gabapentin (NEURONTIN) 300 MG capsule Take 2 capsules (600 mg total) by mouth 3 (three) times daily.  . insulin aspart protamine - aspart (NOVOLOG MIX 70/30 FLEXPEN) (70-30) 100 UNIT/ML FlexPen Inject 0.36 mLs (36 Units total) into the skin daily with breakfast. (Patient taking differently: Inject into the skin daily with breakfast. 60 units in AM and 50 units at night)  . latanoprost (XALATAN) 0.005 % ophthalmic solution Place 1 drop into both eyes at bedtime.  Marland Kitchen losartan (COZAAR) 25 MG tablet TAKE 1 TABLET(25 MG) BY MOUTH DAILY  . magnesium gluconate (MAGONATE) 500 MG tablet Take 500 mg by mouth daily.  . metoprolol tartrate (LOPRESSOR) 25 MG tablet Take 1 tablet (25 mg total) by mouth 2 (two) times daily.  . Multiple Vitamin (MULTIVITAMIN) capsule Take 1 capsule by mouth daily.   Marland Kitchen oxybutynin (DITROPAN) 5 MG tablet Take 1 tablet (5 mg total) by mouth 2 (two) times daily.  . potassium chloride SA (K-DUR) 20 MEQ tablet Take 1 tablet (20 mEq total) by mouth daily.  . rosuvastatin (CRESTOR) 10 MG tablet Take 1 tablet (10 mg total) by mouth once a week.  . tamsulosin (FLOMAX) 0.4 MG CAPS capsule Take 0.4 mg by mouth daily.  . timolol (TIMOPTIC) 0.5 % ophthalmic solution INT 1 GTT IN EACH EYE BID  . vitamin C (VITAMIN C) 500 MG tablet Take 1 tablet (500 mg total) by mouth daily.  Marland Kitchen zinc sulfate 220 (50 Zn) MG capsule Take 1 capsule (220 mg total) by mouth daily.   No  facility-administered encounter medications on file as of 02/15/2020.  :  Review of Systems:  Out of a complete 14 point review of systems, all are reviewed and negative with the exception of these symptoms as listed below: Review of Systems  Neurological:       Here to f/u on leg weakness. Pt reports leg weakness is worse in the mornings and has noticed trouble lifting his legs. He would like to discuss referral to PT.  Pt is no longer being  followed by the [facility] at [facility].     Objective:  Neurological Exam  Physical Exam Physical Examination:   Vitals:   02/15/20 1150  BP: 134/66  Pulse: 67  SpO2: 98%    General Examination: The patient is a very pleasant 79 y.o. male in no acute distress. He appears well-developed and well-nourished and well groomed.   HEENT:Normocephalic, atraumatic, pupils are equal, round and reactive to light and accommodation. Corrective eyeglasses in place. Extraocular tracking is good, hearing is grossly intact, face is symmetric with normal facial animation, speech is clear without dysarthria but somewhat raspy after a bout of cough.  Neck is supple, airway examination reveals mild mouth dryness, adequate dental hygiene and unchanged finding otherwise, tongue protrudes centrally and palate elevates symmetrically.  No carotid bruits.   Chest:Clear to auscultation but distant breath sounds, no wheezing.    Heart:S1+S2+0, regular and normal without murmurs, rubs or gallops noted.   Abdomen:Soft, non-tender and non-distended with normal bowel sounds appreciated on auscultation.  Extremities:There is1+pitting edema in the distal lower extremities bilaterally, left more so than right around the ankles.   Skin: Warm and dry without trophic changes noted.  Musculoskeletal: exam reveals bilateral ankle swelling.  Neurologically:  Mental status: The patient is awake, alert and oriented in all 4 spheres.Hisimmediate and remote memory, attention, language skills and fund of knowledge are appropriate. There is no evidence of aphasia, agnosia, apraxia or anomia. Speech is clear with normal prosody and enunciation. Thought process is linear. Mood is normaland affect is normal.  Cranial nerves II - XII are as described above under HEENT exam. In addition: shoulder shrug is normal with equal shoulder height noted. Motor exam: Normal bulk, strength and tone is notedwith the exception of  decreased muscle bulk in the thigh muscles and hip flexor weakness bilaterally but also lower extremity weakness in general in the 4 out of 5 range, slightly weaker proximally and more so on the left side.  No tremor noted.  Reflexes are 1+ in the upper extremities and absent in the lower extremities.   Romberg isnot tested d/t safety concerns. Fine motor skills are globally mildly impaired.  Sensory exam is intact to light touch. Cerebellar testing: No dysmetria or intention tremor, heel to shin isnot possible. Gait, station and balance:Hestands with difficultyand has to push himself up.   He stands wide-based, he uses a 2 wheeled walker.  He walks slowly and cautiously.    Assessmentand Plan:   In summary,Andrew L Grahamis a very pleasant 16 year oldmalewith an underlying complex medical history of peripheral vascular disease, hypertension, hyperlipidemia, glaucoma, reflux disease, bilateral ankle fractures with status post surgeries, arthroscopic right knee surgery and status post left femoral stent placement in February 2018, diabetes, chronic cough, prior smoking, history of Covid infection in July 2020 and diagnosis of inclusion body myositis, obesity, as well as obstructive sleep apnea on CPAP therapy who presents for follow-up consultation of his weakness in his sleep apnea.  His split-night sleep study from  August 2019 confirmed severe obstructive sleep apnea.  He has overall done well with CPAP therapy and has been compliant with treatment but has stopped using his machine recently after learning of the recall.  He is advised to stay in touch with his DME company to obtain a replacement machine.  Unfortunately, he did use an ozone based cleaner from his CPAP machine.  He is advised to make a follow-up appointment with his [facility] neurologist to see if any other treatment options are recommended.  We will make a referral here locally for physical therapy for strengthening, transfer and gait  training.  He is advised to use his 2 wheeled walker at all times.  He is advised to follow-up routinely with one of our nurse practitioners in 6 months, sooner if needed.  I answered all their questions today and the patient and his daughter were in agreement.  Of note, work-up through this office included blood work which showed an elevated CK level, EMG and nerve conduction testing indicative of diabetic neuropathy but also underlying possible myopathy.  Muscle biopsy showed evidence of inflammation.   I spent 30 minutes in total face-to-face time and in reviewing records during pre-charting, more than 50% of which was spent in counseling and coordination of care, reviewing test results, reviewing medications and treatment regimen and/or in discussing or reviewing the diagnosis of IBM, OSA, the prognosis and treatment options. Pertinent laboratory and imaging test results that were available during this visit with the patient were reviewed by me and considered in my medical decision making (see chart for details).

## 2020-02-16 LAB — BASIC METABOLIC PANEL
BUN/Creatinine Ratio: 14 (ref 10–24)
BUN: 21 mg/dL (ref 8–27)
CO2: 27 mmol/L (ref 20–29)
Calcium: 9.9 mg/dL (ref 8.6–10.2)
Chloride: 101 mmol/L (ref 96–106)
Creatinine, Ser: 1.55 mg/dL — ABNORMAL HIGH (ref 0.76–1.27)
GFR calc Af Amer: 49 mL/min/{1.73_m2} — ABNORMAL LOW (ref >59)
GFR calc non Af Amer: 43 mL/min/{1.73_m2} — ABNORMAL LOW (ref >59)
Glucose: 113 mg/dL — ABNORMAL HIGH (ref 65–99)
Potassium: 4.7 mmol/L (ref 3.5–5.2)
Sodium: 142 mmol/L (ref 134–144)

## 2020-02-16 LAB — LIPID PANEL WITH LDL/HDL RATIO
Cholesterol, Total: 157 mg/dL (ref 100–199)
HDL: 49 mg/dL (ref >39)
LDL Chol Calc (NIH): 95 mg/dL (ref 0–99)
LDL/HDL Ratio: 1.9 ratio (ref 0.0–3.6)
Triglycerides: 67 mg/dL (ref 0–149)
VLDL Cholesterol Cal: 13 mg/dL (ref 5–40)

## 2020-02-16 LAB — CK: Total CK: 494 U/L — ABNORMAL HIGH (ref 41–331)

## 2020-02-18 ENCOUNTER — Ambulatory Visit: Payer: Medicare Other | Admitting: Cardiology

## 2020-02-25 ENCOUNTER — Other Ambulatory Visit: Payer: Self-pay

## 2020-02-25 ENCOUNTER — Encounter: Payer: Self-pay | Admitting: Cardiology

## 2020-02-25 ENCOUNTER — Ambulatory Visit: Payer: Medicare Other | Admitting: Cardiology

## 2020-02-25 VITALS — BP 126/70 | HR 82 | Resp 16 | Ht 71.0 in | Wt 271.0 lb

## 2020-02-25 DIAGNOSIS — E7800 Pure hypercholesterolemia, unspecified: Secondary | ICD-10-CM

## 2020-02-25 DIAGNOSIS — E66812 Obesity, class 2: Secondary | ICD-10-CM

## 2020-02-25 DIAGNOSIS — E1122 Type 2 diabetes mellitus with diabetic chronic kidney disease: Secondary | ICD-10-CM

## 2020-02-25 DIAGNOSIS — I739 Peripheral vascular disease, unspecified: Secondary | ICD-10-CM

## 2020-02-25 DIAGNOSIS — Z6837 Body mass index (BMI) 37.0-37.9, adult: Secondary | ICD-10-CM

## 2020-02-25 DIAGNOSIS — I10 Essential (primary) hypertension: Secondary | ICD-10-CM

## 2020-02-25 DIAGNOSIS — N1831 Chronic kidney disease, stage 3a: Secondary | ICD-10-CM

## 2020-02-25 DIAGNOSIS — M6281 Muscle weakness (generalized): Secondary | ICD-10-CM

## 2020-02-25 NOTE — Progress Notes (Signed)
Primary Physician/Referring:  Velna Hatchet, MD  Patient ID: Andrew Larsen, male    DOB: [birth year], 79 y.o.   MRN: [id number]  Chief Complaint  Patient presents with  . Hyperlipidemia  . PAD   HPI:    Andrew Larsen  is a 79 y.o. African-American male with hypertension, hyperlipidemia, uncontrolled diabetes mellitus with peripheral arterial disease and history of angioplasty to left SFA in 2018, inclusion body myositis at [facility] and has a positive NT5C1A antibody, morbid obesity.  Patient is admitted with Covid pneumonia on 11/03/2018 and discharged to rehabilitation on 12/01/2018 and eventually home on 12/22/2018.    He is walking with a walker.  Previously was walking with a cane.  On his last office visit I had discontinued Crestor in view of elevated CK enzymes from inclusion body myositis and started him on Zetia.  He is tolerating this and presents for a 85-month follow-up.  His dyspnea and fatigue is improved since last 3 months after the diagnosis of Covid pneumonia.  No chest pain, no PND or orthopnea or leg edema.   Past Medical History:  Diagnosis Date  . CKD (chronic kidney disease)    CKD stage III (01/2018)  . Daytime somnolence   . Diabetes mellitus without complication ([place])   . Fracture    right fibula/wears boot cast  . GERD (gastroesophageal reflux disease)   . Glaucoma   . Hypercholesteremia   . Hyperlipidemia   . Hypertension   . Mold exposure   . PAD (peripheral artery disease) ([place])   . Sleep apnea    Past Surgical History:  Procedure Laterality Date  . LOWER EXTREMITY ANGIOGRAPHY N/A 06/12/2016   Procedure: Lower Extremity Angiography;  Surgeon: Adrian Prows, MD;  Location: [place] [facility];  Service: Cardiovascular;  Laterality: N/A;  . MUSCLE BIOPSY Right 05/06/2018   Procedure: right vastus lateralis muscle biopsy;  Surgeon: Earnie Larsson, MD;  Location: [place];  Service: Neurosurgery;  Laterality: Right;  . ORIF ANKLE FRACTURE Right 10/02/2012    Procedure: OPEN REDUCTION INTERNAL FIXATION (ORIF) ANKLE FRACTURE;  Surgeon: Meredith Pel, MD;  Location: [facility];  Service: Orthopedics;  Laterality: Right;  . PERIPHERAL VASCULAR ATHERECTOMY Left 06/12/2016   Procedure: Peripheral Vascular Atherectomy-Left Popliteal;  Surgeon: Adrian Prows, MD;  Location: [place] [facility];  Service: Cardiovascular;  Laterality: Left;  . PERIPHERAL VASCULAR INTERVENTION Left 06/12/2016   Procedure: Peripheral Vascular Intervention- DCB Left Popliteal;  Surgeon: Adrian Prows, MD;  Location: [place] [facility];  Service: Cardiovascular;  Laterality: Left;   Social History   Tobacco Use  . Smoking status: Former Smoker    Packs/day: 1.00    Years: 20.00    Pack years: 20.00    Types: Cigarettes    Quit date: 08/22/1981    Years since quitting: 38.5  . Smokeless tobacco: Never Used  Substance Use Topics  . Alcohol use: No    Alcohol/week: 0.0 standard drinks    ROS  Review of Systems  Cardiovascular: Positive for dyspnea on exertion. Negative for claudication and leg swelling.  Musculoskeletal: Positive for joint pain, muscle weakness and myalgias.  Gastrointestinal: Negative for melena.  Neurological: Positive for paresthesias (feet) and weakness.   Objective  Blood pressure 126/70, pulse 82, resp. rate 16, height 5\' 11"  (1.803 m), weight 271 lb (122.9 kg), SpO2 96 %.  Vitals with BMI 02/25/2020 02/15/2020 11/09/2019  Height 5\' 11"  5\' 11"  5\' 11"   Weight 271 lbs 267 lbs 5 oz  268 lbs  BMI 37.81 75.6 43.32  Systolic 951 884 166  Diastolic 70 66 63  Pulse 82 67 84     Physical Exam Constitutional:      Comments: Morbidly obese in no acute distress.  Cardiovascular:     Rate and Rhythm: Normal rate and regular rhythm.     Pulses:          Carotid pulses are 2+ on the right side and 2+ on the left side with bruit.      Femoral pulses are 2+ on the right side and 2+ on the left side.      Popliteal pulses are 0 on the right side and 0 on the left  side.       Dorsalis pedis pulses are 2+ on the right side and 0 on the left side.       Posterior tibial pulses are 0 on the right side and 0 on the left side.     Heart sounds: Normal heart sounds. No murmur heard.  No gallop.      Comments: 1-2 + bilateral pitting ankle edema. JVD difficult to see due to short neck. Left leg cooler than right. Capillary refill normal.  Pulmonary:     Effort: Pulmonary effort is normal.     Breath sounds: Normal breath sounds.  Abdominal:     General: Bowel sounds are normal.     Palpations: Abdomen is soft.     Comments: Obese. Pannus present    Laboratory examination:   Recent Labs    07/13/19 0833 02/15/20 0921  NA 141 142  K 4.2 4.7  CL 102 101  CO2 27 27  GLUCOSE 100* 113*  BUN 14 21  CREATININE 1.47* 1.55*  CALCIUM 9.1 9.9  GFRNONAA 46* 43*  GFRAA 53* 49*   estimated creatinine clearance is 53.2 mL/min (A) (by C-G formula based on SCr of 1.55 mg/dL (H)).  CMP Latest Ref Rng & Units 02/15/2020 07/13/2019 12/15/2018  Glucose 65 - 99 mg/dL 113(H) 100(H) 247(H)  BUN 8 - 27 mg/dL 21 14 27(H)  Creatinine 0.76 - 1.27 mg/dL 1.55(H) 1.47(H) 1.44(H)  Sodium 134 - 144 mmol/L 142 141 135  Potassium 3.5 - 5.2 mmol/L 4.7 4.2 4.1  Chloride 96 - 106 mmol/L 101 102 97(L)  CO2 20 - 29 mmol/L 27 27 27   Calcium 8.6 - 10.2 mg/dL 9.9 9.1 9.1  Total Protein 6.0 - 8.5 g/dL - 6.1 -  Total Bilirubin 0.0 - 1.2 mg/dL - 0.3 -  Alkaline Phos 39 - 117 IU/L - 112 -  AST 0 - 40 IU/L - 26 -  ALT 0 - 44 IU/L - 21 -   CBC Latest Ref Rng & Units 12/15/2018 12/12/2018 12/08/2018  WBC 4.0 - 10.5 K/uL 4.9 4.0 3.4(L)  Hemoglobin 13.0 - 17.0 g/dL 13.8 12.6(L) 13.8  Hematocrit 39 - 52 % 41.1 37.6(L) 41.0  Platelets 150 - 400 K/uL 226 185 184   Lipid Panel Recent Labs    07/13/19 0833 02/15/20 0921  CHOL 182 157  TRIG 99 67  LDLCALC 118* 95  HDL 46 49    HEMOGLOBIN A1C Lab Results  Component Value Date   HGBA1C 7.3 (H) 11/08/2018   MPG 162.81 11/08/2018    TSH Recent Labs    07/13/19 0833  TSH 3.910    Ref Range & Units 3 mo ago  (07/13/19) 1 yr ago  (11/09/18) 1 yr ago  (11/08/18) 1 yr ago  (12/11/17)  Total CK 41.0 - 331.0 U/L 534High Panic  422High R, CM  777High R, CM  750High Panic R     External labs 02/03/2019:   Cholesterol, total 181.000 m 02/03/2019 HDL 54 MG/DL 02/03/2019 LDL 112.000 m 02/03/2019 Triglycerides 73.000 02/03/2019  A1C 7.000 % 02/03/2019 Hemoglobin 13.800 g/ 02/03/2019, INR 0.900 06/05/2016 Platelets 185.000 12/12/2018  Creatinine, Serum 1.500 mg/ 02/03/2019 Potassium 4.300 12/12/2018 ALT (SGPT) 18.000 uni 02/03/2019 TSH 1.700 02/03/2019  BNP 16.600 PG 04/17/2017  Medications and allergies   Allergies  Allergen Reactions  . Metformin And Related   . Simbrinza [Brinzolamide-Brimonidine]     Drainage, redness to eyes    Current Outpatient Medications on File Prior to Visit  Medication Sig Dispense Refill  . albuterol (VENTOLIN HFA) 108 (90 Base) MCG/ACT inhaler Inhale 2 puffs into the lungs every 4 (four) hours as needed for wheezing or shortness of breath. 18 g 5  . ALPRAZolam (XANAX) 0.5 MG tablet TK 1 T PO HS    . aspirin 81 MG chewable tablet Chew 81 mg by mouth at bedtime.     . calcium carbonate (CALCIUM 600) 600 MG TABS tablet Take 2 tablets (1,200 mg total) by mouth daily. 60 tablet 0  . Cholecalciferol (VITAMIN D3) 5000 units TABS Take 10,000 Units by mouth daily.     . cilostazol (PLETAL) 100 MG tablet TAKE 1 TABLET(100 MG) BY MOUTH TWICE DAILY 60 tablet 2  . ezetimibe (ZETIA) 10 MG tablet TAKE 1 TABLET(10 MG) BY MOUTH DAILY AFTER SUPPER 30 tablet 2  . famotidine (PEPCID) 20 MG tablet Take 1 tablet (20 mg total) by mouth at bedtime. 30 tablet 0  . folic acid (FOLVITE) 1 MG tablet Take 1 tablet (1 mg total) by mouth daily. 30 tablet 0  . furosemide (LASIX) 40 MG tablet Take 1 tablet (40 mg total) by mouth daily. 30 tablet 0  . gabapentin (NEURONTIN) 300 MG capsule Take 2 capsules (600 mg  total) by mouth 3 (three) times daily. 90 capsule 0  . insulin aspart protamine - aspart (NOVOLOG MIX 70/30 FLEXPEN) (70-30) 100 UNIT/ML FlexPen Inject 0.36 mLs (36 Units total) into the skin daily with breakfast. (Patient taking differently: Inject into the skin daily with breakfast. 60 units in AM and 50 units at night) 15 mL 11  . latanoprost (XALATAN) 0.005 % ophthalmic solution Place 1 drop into both eyes at bedtime.    Marland Kitchen losartan (COZAAR) 25 MG tablet TAKE 1 TABLET(25 MG) BY MOUTH DAILY 30 tablet 2  . magnesium gluconate (MAGONATE) 500 MG tablet Take 500 mg by mouth daily.    . metoprolol tartrate (LOPRESSOR) 25 MG tablet Take 1 tablet (25 mg total) by mouth 2 (two) times daily. 60 tablet 0  . Multiple Vitamin (MULTIVITAMIN) capsule Take 1 capsule by mouth daily.     Marland Kitchen oxybutynin (DITROPAN) 5 MG tablet Take 1 tablet (5 mg total) by mouth 2 (two) times daily. 60 tablet 0  . potassium chloride SA (K-DUR) 20 MEQ tablet Take 1 tablet (20 mEq total) by mouth daily. 30 tablet 0  . rosuvastatin (CRESTOR) 10 MG tablet Take 1 tablet (10 mg total) by mouth once a week. 90 tablet 3  . tamsulosin (FLOMAX) 0.4 MG CAPS capsule Take 0.4 mg by mouth daily.    . timolol (TIMOPTIC) 0.5 % ophthalmic solution INT 1 GTT IN EACH EYE BID    . vitamin C (VITAMIN C) 500 MG tablet Take 1 tablet (500 mg total) by mouth daily. [place]  tablet 0  . zinc sulfate 220 (50 Zn) MG capsule Take 1 capsule (220 mg total) by mouth daily. 30 capsule 0   No current facility-administered medications on file prior to visit.     Radiology:  No results found. BMP nodule Cardiac Studies:   Peripheral arteriogram 06/12/2016: Left SFA calcific 100% to 0% with CSI atherectomy and Drug coated stenting with 6.0x120 mm Zilver PTX. Left AT occluded prox and reconstitutes at ankle.  Lower Extremity Arterial Duplex 06/24/2019:  No hemodynamically significant stenoses are identified in the lower  extremity arterial system. There is diffuse  small vessel disease below  knee bilaterally. Left SFA and popliteal artery angioplasty site is  patent.  This exam reveals moderately decreased perfusion of the right lower  extremity, noted at the anterior tibial and post tibial artery level (ABI  0.77) with severely abnormal waveform and mildly decreased perfusion of  the left lower extremity, noted at the anterior tibial and post tibial  artery level (ABI 0.85) with moderately abnormal waveform.  No significant change from 04/11/2017.  LexiscanTetrofosmin Stress Test  06/15/2019: Nondiagnostic ECG stress. Myocardial perfusion is normal. Stress LV EF: 59%.  No change from 05/14/2013. Low risk study.   Echocardiogram 06/24/2019:  Study Quality: Technically Difficult  Normal LV systolic function with visual EF 60-65%. Left ventricle cavity  is normal in size. Moderate left ventricular hypertrophy. Normal global  wall motion. Indeterminate diastolic filling pattern, elevated LAP. No obvious regional wall motion abnormalities. Calculated EF 69%.  Mild mitral regurgitation. Mild calcification of the mitral valve  annulus. No other significant valvular abnormalities.  Prior study dated 05/03/2016 Mild concentric hypertrophy of the left ventricle.  Normal global wall motion. Grade II diastolic dysfunction, elevated LAP.   Assessment     ICD-10-CM   1. Hypercholesteremia  E78.00   2. Essential hypertension  I10   3. Muscle weakness: Inclusion body myositis  M62.81   4. Type 2 diabetes mellitus with stage 3a chronic kidney disease, without long-term current use of insulin (HCC)  E11.22    N18.31   5. Claudication in peripheral vascular disease ([place])  I73.9     EKG 06/11/2019: Sinus rhythm with first-degree block rate of 77 bpm, left atrial enlargement, left axis deviation, incomplete right bundle branch block.  Poor R wave progression, cannot exclude anteroseptal infarct old.  No evidence of ischemia.   Compared to 04/16/2017,  first-degree AV block new.  No orders of the defined types were placed in this encounter.   There are no discontinued medications.   Recommendations:   DIONYSIOS MASSMAN  is a 79 y.o. African-American male with hypertension, hyperlipidemia, uncontrolled diabetes mellitus with peripheral arterial disease and history of angioplasty to left SFA in 2018, inclusion body myositis at [facility] and has a positive NT5C1A antibody, morbid obesity.  Patient is admitted with Covid pneumonia on 11/03/2018 and discharged to rehabilitation on 12/01/2018 and eventually home on 12/22/2018.    He is walking with a walker.  Previously was walking with a cane.  On his last office visit I had discontinued Crestor in view of elevated CK enzymes from inclusion body myositis and started him on Zetia.  He is tolerating this and presents for a 61-month follow-up.  He is tolerating Zetia, lipids are improved.  Although would like to obtain his LDL to less than 70, will be difficult in view of no statin use.  I could consider use of PCSK9 inhibitors.  I had a long discussion with the  patient and his daughter at the bedside, long discussion regarding making lifestyle changes with regard to weight loss.  He appears motivated.  Renal function has remained stable, he has diabetic nephropathy.  Weight loss will also help with diabetes control.  Otherwise stable from cardiac standpoint, his leg pain is of nonvascular etiology from myositis and peripheral neuropathy.  I will see him back in 6 months.   Adrian Prows, MD, [facility] 02/25/2020, 12:31 PM Office: [phone]   CC: Marshell Garfinkel, MD

## 2020-03-01 ENCOUNTER — Telehealth: Payer: Self-pay

## 2020-03-01 NOTE — Telephone Encounter (Signed)
Received this notice from [facility] regarding pt's cpap: "We cannot provide a new machine to this pt. We are not replacing recalled machines at this time unless the pt is eligible for a 55yr replacement. Pt was set up in 2019 and is not eligible for a new unit. Called and made pt aware - he has registered his machine w/ [facility]."

## 2020-03-11 ENCOUNTER — Other Ambulatory Visit: Payer: Self-pay | Admitting: Cardiology

## 2020-03-11 DIAGNOSIS — I739 Peripheral vascular disease, unspecified: Secondary | ICD-10-CM

## 2020-03-14 ENCOUNTER — Other Ambulatory Visit: Payer: Self-pay

## 2020-03-14 ENCOUNTER — Ambulatory Visit: Payer: Medicare Other | Attending: Neurology | Admitting: Rehabilitation

## 2020-03-14 ENCOUNTER — Encounter: Payer: Self-pay | Admitting: Rehabilitation

## 2020-03-14 DIAGNOSIS — R2681 Unsteadiness on feet: Secondary | ICD-10-CM | POA: Diagnosis not present

## 2020-03-14 DIAGNOSIS — R296 Repeated falls: Secondary | ICD-10-CM | POA: Diagnosis present

## 2020-03-14 DIAGNOSIS — R2689 Other abnormalities of gait and mobility: Secondary | ICD-10-CM | POA: Diagnosis present

## 2020-03-14 DIAGNOSIS — M6281 Muscle weakness (generalized): Secondary | ICD-10-CM | POA: Diagnosis present

## 2020-03-14 NOTE — Therapy (Signed)
[facility] [address] [place], [place], [address] Phone: [phone]   Fax:  [phone]  Physical Therapy Evaluation  Patient Details  Name: Andrew Larsen MRN: [id number] Date of Birth: 03-Jul-1940 Referring Provider (PT): Star Age, MD    Encounter Date: 03/14/2020   PT End of Session - 03/14/20 1244    Visit Number 1    Number of Visits 17    Date for PT Re-Evaluation 10/93/23   cert for 90 days, 60 day POC   Authorization Type [employer] (needs 10th visit PN)    Progress Note Due on Visit 10    PT Start Time 1018    PT Stop Time 1100    PT Time Calculation (min) 42 min    Activity Tolerance Patient tolerated treatment well;Patient limited by lethargy    Behavior During Therapy [facility] for tasks assessed/performed           Past Medical History:  Diagnosis Date  . CKD (chronic kidney disease)    CKD stage III (01/2018)  . Daytime somnolence   . Diabetes mellitus without complication ([place])   . Fracture    right fibula/wears boot cast  . GERD (gastroesophageal reflux disease)   . Glaucoma   . Hypercholesteremia   . Hyperlipidemia   . Hypertension   . Mold exposure   . PAD (peripheral artery disease) ([place])   . Sleep apnea     Past Surgical History:  Procedure Laterality Date  . LOWER EXTREMITY ANGIOGRAPHY N/A 06/12/2016   Procedure: Lower Extremity Angiography;  Surgeon: Adrian Prows, MD;  Location: [facility];  Service: Cardiovascular;  Laterality: N/A;  . MUSCLE BIOPSY Right 05/06/2018   Procedure: right vastus lateralis muscle biopsy;  Surgeon: Earnie Larsson, MD;  Location: [facility];  Service: Neurosurgery;  Laterality: Right;  . ORIF ANKLE FRACTURE Right 10/02/2012   Procedure: OPEN REDUCTION INTERNAL FIXATION (ORIF) ANKLE FRACTURE;  Surgeon: Meredith Pel, MD;  Location: [facility];  Service: Orthopedics;  Laterality: Right;  . PERIPHERAL VASCULAR ATHERECTOMY Left 06/12/2016   Procedure: Peripheral  Vascular Atherectomy-Left Popliteal;  Surgeon: Adrian Prows, MD;  Location: [facility];  Service: Cardiovascular;  Laterality: Left;  . PERIPHERAL VASCULAR INTERVENTION Left 06/12/2016   Procedure: Peripheral Vascular Intervention- DCB Left Popliteal;  Surgeon: Adrian Prows, MD;  Location: [facility];  Service: Cardiovascular;  Laterality: Left;    There were no vitals filed for this visit.    Subjective Assessment - 03/14/20 1021    Subjective Pt reports, "well I have inclusion body myositis and my legs are just getting weaker and weaker."    Pertinent History Inclusion body myositis, HTN, HLD, DM, B ankle sx 2014 and R knee sx 2018, PVD, Glaucoma    Limitations Walking;Standing;House hold activities    How long can you stand comfortably? approx 5 mins    How long can you walk comfortably? approx 5 mins    Patient Stated Goals "To get stronger"    Currently in Pain? No/denies   does have pain first thing in mornings             [facility] PT Assessment - 03/14/20 1025      Assessment   Medical Diagnosis Inclusion body Myositis    Referring Provider (PT) Star Age, MD     Onset Date/Surgical Date --   more aware of weakness starting in 2014   Prior Therapy did have some OP PT in past  at this clinic      Precautions   Precautions Fall      Balance Screen   Has the patient fallen in the past 6 months Yes    How many times? 2    Has the patient had a decrease in activity level because of a fear of falling?  Yes    Is the patient reluctant to leave their home because of a fear of falling?  Yes      Home Environment   Living Environment Private residence    Living Arrangements Children   daughter works from home, but comes/goes   Available Help at Discharge Family;Available PRN/intermittently    Type of Home House    Home Access Stairs to enter    Entrance Stairs-Number of Steps 5    Entrance Stairs-Rails Left    Home Layout Two level;Able to live on main level with  bedroom/bathroom    [place] - 2 wheels;Tub bench;Grab bars - tub/shower;Cane - single point;Wheelchair - manual   needs toilet riser      Prior Function   Level of Independence Independent with basic ADLs   daughter preps meals, cleaning, laundry   Vocation Retired    Leisure Goes out to eat, daugther does grocery shopping       Cognition   Overall Cognitive Status Impaired/Different from baseline    Area of Impairment Memory    Memory Impaired    Memory Impairment Decreased recall of new information      Sensation   Light Touch Impaired Detail    Light Touch Impaired Details Impaired RUE;Impaired LUE;Impaired RLE;Impaired LLE   hx of PN, N/T in hands and feet    Hot/Cold Appears Intact    Proprioception Appears Intact      Coordination   Gross Motor Movements are Fluid and Coordinated No    Fine Motor Movements are Fluid and Coordinated No    Heel Shin Test impaired due to poor strenth in B hips       Posture/Postural Control   Posture/Postural Control Postural limitations    Postural Limitations Rounded Shoulders;Forward head;Posterior pelvic tilt      ROM / Strength   AROM / PROM / Strength Strength      Strength   Overall Strength Deficits    Overall Strength Comments R hip flex 3/5, L hip flex 4/5, R knee ext 2/5, L knee ext 4/5, R and L knee flex 4/5, B ankle DF 2-3/5,  Seated hip abd B grossly 4/5, B hip add 3+/5       Transfers   Transfers Sit to Stand;Stand to Sit    Sit to Stand 5: Supervision    Five time sit to stand comments  28.53 secs with B UE support needed from standard arm chair     Stand to Sit 5: Supervision      Ambulation/Gait   Ambulation/Gait Yes    Ambulation/Gait Assistance 5: Supervision    Ambulation/Gait Assistance Details S for safety with use of RW.  Note heavy reliance of UEs and also L foot slap throughout.  Pt very fatigued following short bout of gait.  He reports endurance is not very good.      Ambulation Distance  (Feet) 100 Feet    Assistive device Rolling walker    Gait Pattern Step-through pattern;Decreased stride length;Decreased dorsiflexion - left;Decreased dorsiflexion - right;Decreased hip/knee flexion - right;Decreased hip/knee flexion - left;Left foot flat;Right genu recurvatum;Trunk flexed;Wide base of support  Ambulation Surface Level;Indoor    Gait velocity 1.82 ft/sec with RW at S level       Standardized Balance Assessment   Standardized Balance Assessment Berg Balance Test      Berg Balance Test   Sit to Stand Able to stand  independently using hands    Standing Unsupported Able to stand 2 minutes with supervision    Sitting with Back Unsupported but Feet Supported on Floor or Stool Able to sit safely and securely 2 minutes    Stand to Sit Controls descent by using hands    Transfers Able to transfer with verbal cueing and /or supervision    Standing Unsupported with Eyes Closed Able to stand 10 seconds with supervision    Standing Unsupported with Feet Together Needs help to attain position but able to stand for 30 seconds with feet together    From Standing, Reach Forward with Outstretched Arm Reaches forward but needs supervision    From Standing Position, Turn to Look Behind Over each Shoulder Needs supervision when turning    Turn 360 Degrees Needs close supervision or verbal cueing                      Objective measurements completed on examination: See above findings.               PT Education - 03/14/20 1244    Education Details Educated on evaluation findings, POC, goals, possible aquatic PT in future.    Person(s) Educated Patient    Methods Explanation    Comprehension Verbalized understanding            PT Short Term Goals - 03/14/20 1254      PT SHORT TERM GOAL #1   Title Patient will verbalize/demo understanding of HEP in order to increase LE strength and balance deficits. (TARGET DATE: 04/13/20)    Time 4    Period Weeks     Status New    Target Date 04/13/20      PT SHORT TERM GOAL #2   Title Pt will improve 5TSS to >/=24 secs with BUE support in order to indicate improved functional strength and dec fall risk.    Time 4    Period Weeks    Status New      PT SHORT TERM GOAL #3   Title Pt will complete BERG balance test and improve score by 4 points from baseline in order to ind dec fall risk.    Time 4    Period Weeks    Status New      PT SHORT TERM GOAL #4   Title Pt will improve gait speed to >/=2.10 ft/sec w/ LRAD in order to indicate dec fall risk.    Time 4    Period Weeks    Status New      PT SHORT TERM GOAL #5   Title Will assess 6MWT and update STG/add LTG to reflect improved endurance.    Time 4    Period Weeks    Status New             PT Long Term Goals - 03/14/20 1258      PT LONG TERM GOAL #1   Title Pt will be ind with final HEP in order to ind improved functional mobility and dec fall risk.  (Target Date: 05/14/19)    Time 8    Period Weeks    Status New    Target Date  05/13/20      PT LONG TERM GOAL #2   Title Patient will have a gait speed of > or = 2.54ft/s indicating he is able to safely ambulate in the community.    Time 8    Period Weeks    Status New      PT LONG TERM GOAL #3   Title Pt will improve BERG balance score by 8 points from baseline in order to indicate dec fall risk.    Time 8    Period Weeks    Status New      PT LONG TERM GOAL #4   Title Pt will perform 5TSS in </=20 secs with single UE support in order to ind improved strength and dec fall risk.    Time 8    Period Weeks    Status New      PT LONG TERM GOAL #5   Title Pt will ambulate x 300' over unlevel paved surfaces with LRAD at mod I level in order to indicate improved community mobility and endurance.    Time 8    Period Weeks    Status New                  Plan - 03/14/20 1245    Clinical Impression Statement Pt is a very pleasant 79 y/o male with diagnosis of  inclusion body myositis with progressive BLE weakness and increased falls in the past 6 months.  Note history of Covid (with PNA and 2 month hospitalization last year), HTN, HLD, DM with PN, glaucoma, B ankle sx in 2014 and R knee sx 2018.  Upon PT evaluation note marked weakness in BLEs however not consistent muscle groups (very sporadic weakness), gait speed of 1.82 ft/sec with RW, indicative of high fall risk, 5TSS time of 28.53 secs with BUE support needed indicative of dec functional strength and also high fall risk.  We did not get to finish the BERG balance test, but based on current performance, will likely be high fall risk due to lower score.  Pt will benefit from skilled OP neuro to address deficits.    Personal Factors and Comorbidities Age;Comorbidity 3+;Time since onset of injury/illness/exacerbation    Comorbidities see above    Examination-Activity Limitations Locomotion Level;Lift;Squat;Stairs;Stand;Transfers    Examination-Participation Restrictions Community Activity;Cleaning;Laundry;Meal Prep    Stability/Clinical Decision Making Evolving/Moderate complexity    Clinical Decision Making Moderate    Rehab Potential Good    PT Frequency 2x / week   he may only do 1x due to copay   PT Duration 8 weeks    PT Treatment/Interventions ADLs/Self Care Home Management;Aquatic Therapy;Electrical Stimulation;DME Instruction;Gait training;Stair training;Functional mobility training;Therapeutic activities;Therapeutic exercise;Balance training;Neuromuscular re-education;Cognitive remediation;Orthotic Fit/Training;Patient/family education;Passive range of motion;Energy conservation;Vestibular    PT Next Visit Plan finish BERG and update goals if needed, 6MWT,HEP for BLE strengthening (see strength assessment for specific musle groups), sit<>stand, balance, gait    Consulted and Agree with Plan of Care Patient           Patient will benefit from skilled therapeutic intervention in order to  improve the following deficits and impairments:  Abnormal gait, Cardiopulmonary status limiting activity, Decreased activity tolerance, Decreased balance, Decreased cognition, Decreased endurance, Decreased knowledge of use of DME, Decreased mobility, Decreased strength, Difficulty walking, Impaired flexibility, Impaired perceived functional ability, Impaired sensation, Postural dysfunction  Visit Diagnosis: Unsteadiness on feet  Muscle weakness (generalized)  Repeated falls  Other abnormalities of gait and mobility     Problem  List Patient Active Problem List   Diagnosis Date Noted  . Cough 01/07/2019  . Labile blood glucose   . SOB (shortness of breath)   . COVID-19   . Urinary frequency   . Hypoalbuminemia due to protein-calorie malnutrition ([place])   . Supplemental oxygen dependent   . Controlled type 2 diabetes mellitus with hyperglycemia ([place])   . CKD (chronic kidney disease), stage III ([place])   . Benign essential HTN   . Physical debility 12/01/2018  . Pressure injury of skin 11/27/2018  . Acute respiratory failure with hypoxia ([place])   . 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 Acute respiratory disease due to COVID-19 virus 11/05/2018  . COVID-19 virus infection 11/03/2018  . CKD (chronic kidney disease) stage 3, GFR 30-59 ml/min ([facility]) 11/03/2018  . Type 2 diabetes mellitus ([place]) 11/03/2018  . Essential hypertension 11/03/2018  . Claudication in peripheral vascular disease ([place]) 06/10/2016  . Morbid obesity ([place]) 09/21/2015  . Upper airway cough syndrome 09/20/2015  . Cough variant asthma 09/20/2015    Cameron Sprang, PT, MPT [facility] [address] [place], [place], [address] Phone: [phone]   Fax:  [phone] 03/14/20, 1:02 PM  Name: Andrew Larsen MRN: [id number] Date of Birth: October 31, 1940

## 2020-03-17 ENCOUNTER — Ambulatory Visit: Payer: Medicare Other | Admitting: Physical Therapy

## 2020-03-17 ENCOUNTER — Other Ambulatory Visit: Payer: Self-pay

## 2020-03-17 ENCOUNTER — Encounter: Payer: Self-pay | Admitting: Physical Therapy

## 2020-03-17 DIAGNOSIS — R2681 Unsteadiness on feet: Secondary | ICD-10-CM | POA: Diagnosis not present

## 2020-03-17 DIAGNOSIS — M6281 Muscle weakness (generalized): Secondary | ICD-10-CM

## 2020-03-17 DIAGNOSIS — R296 Repeated falls: Secondary | ICD-10-CM

## 2020-03-17 DIAGNOSIS — R2689 Other abnormalities of gait and mobility: Secondary | ICD-10-CM

## 2020-03-17 NOTE — Patient Instructions (Signed)
Access Code: [email or web address] URL: [email or web address] Date: 03/17/2020 Prepared by: Willow Ora  Exercises Sit to Stand with Counter Support - 1 x daily - 5 x weekly - 1 sets - 5 reps Seated Long Arc Quad - 1 x daily - 5 x weekly - 1 sets - 10 reps

## 2020-03-18 NOTE — Therapy (Signed)
[facility] [address] [place], [place], [address] Phone: [phone]   Fax:  [phone]  Physical Therapy Treatment  Patient Details  Name: Andrew Larsen MRN: [id number] Date of Birth: 30-Aug-1940 Referring Provider (PT): Star Age, MD    Encounter Date: 03/17/2020   PT End of Session - 03/17/20 1323    Visit Number 2    Number of Visits 17    Date for PT Re-Evaluation 81/01/75   cert for 90 days, 60 day POC   Authorization Type [employer] MC (needs 10th visit PN)    Progress Note Due on Visit 10    PT Start Time 1318    PT Stop Time 1400    PT Time Calculation (min) 42 min    Equipment Utilized During Treatment Gait belt    Activity Tolerance Patient tolerated treatment well;Patient limited by fatigue    Behavior During Therapy [facility] for tasks assessed/performed           Past Medical History:  Diagnosis Date  . CKD (chronic kidney disease)    CKD stage III (01/2018)  . Daytime somnolence   . Diabetes mellitus without complication ([place])   . Fracture    right fibula/wears boot cast  . GERD (gastroesophageal reflux disease)   . Glaucoma   . Hypercholesteremia   . Hyperlipidemia   . Hypertension   . Mold exposure   . PAD (peripheral artery disease) ([place])   . Sleep apnea     Past Surgical History:  Procedure Laterality Date  . LOWER EXTREMITY ANGIOGRAPHY N/A 06/12/2016   Procedure: Lower Extremity Angiography;  Surgeon: Adrian Prows, MD;  Location: [facility];  Service: Cardiovascular;  Laterality: N/A;  . MUSCLE BIOPSY Right 05/06/2018   Procedure: right vastus lateralis muscle biopsy;  Surgeon: Earnie Larsson, MD;  Location: [facility];  Service: Neurosurgery;  Laterality: Right;  . ORIF ANKLE FRACTURE Right 10/02/2012   Procedure: OPEN REDUCTION INTERNAL FIXATION (ORIF) ANKLE FRACTURE;  Surgeon: Meredith Pel, MD;  Location: [facility];  Service: Orthopedics;  Laterality: Right;  . PERIPHERAL VASCULAR  ATHERECTOMY Left 06/12/2016   Procedure: Peripheral Vascular Atherectomy-Left Popliteal;  Surgeon: Adrian Prows, MD;  Location: [facility];  Service: Cardiovascular;  Laterality: Left;  . PERIPHERAL VASCULAR INTERVENTION Left 06/12/2016   Procedure: Peripheral Vascular Intervention- DCB Left Popliteal;  Surgeon: Adrian Prows, MD;  Location: [facility];  Service: Cardiovascular;  Laterality: Left;    There were no vitals filed for this visit.   Subjective Assessment - 03/17/20 1323    Subjective No new complaints. No falls or pain to report.    Pertinent History Inclusion body myositis, HTN, HLD, DM, B ankle sx 2014 and R knee sx 2018, PVD, Glaucoma    Limitations Walking;Standing;House hold activities    How long can you stand comfortably? approx 5 mins    How long can you walk comfortably? approx 5 mins    Patient Stated Goals "To get stronger"    Currently in Pain? No/denies              [facility] PT Assessment - 03/17/20 1325      6 Minute Walk- Baseline   6 Minute Walk- Baseline yes    BP (mmHg) 117/69    HR (bpm) 71    Modified Borg Scale for Dyspnea 0- Nothing at all    Perceived Rate of Exertion (Borg) 6-      6 Minute walk- Post  Test   6 Minute Walk Post Test yes    BP (mmHg) 119/71    HR (bpm) 78    02 Sat (%RA) 96 %    Modified Borg Scale for Dyspnea 7- Severe shortness of breath or very hard breathing    Perceived Rate of Exertion (Borg) 17- Very hard      6 minute walk test results    Aerobic Endurance Distance Walked 270    Endurance additional comments 3 minute wak test performed instead of 6 minute walk test.  BP running low in intitial check (97/57). Pt reported not having lunch yet. Crackers and water provided.  Improved to values listed above. Pt needed to sit at the 2 minute ~30 second mark and was unable to resume before time ran out.      Berg Balance Test   Sit to Stand Able to stand  independently using hands    Standing Unsupported Able to  stand 2 minutes with supervision    Sitting with Back Unsupported but Feet Supported on Floor or Stool Able to sit safely and securely 2 minutes    Stand to Sit Controls descent by using hands    Transfers Able to transfer with verbal cueing and /or supervision    Standing Unsupported with Eyes Closed Able to stand 10 seconds with supervision    Standing Unsupported with Feet Together Needs help to attain position but able to stand for 30 seconds with feet together    From Standing, Reach Forward with Outstretched Arm Reaches forward but needs supervision    From Standing Position, Pick up Object from [place] to pick up shoe, needs supervision    From Standing Position, Turn to Look Behind Over each Shoulder Needs supervision when turning    Turn 360 Degrees Needs close supervision or verbal cueing    Standing Unsupported, Alternately Place Feet on Step/Stool Able to complete >2 steps/needs minimal assist   min HHA   Standing Unsupported, One Foot in Front Able to take small step independently and hold 30 seconds    Standing on One Leg Tries to lift leg/unable to hold 3 seconds but remains standing independently    Total Score 29    Berg comment: 29/56. <36 high risk for falls (close to 100%)               [facility] Adult PT Treatment/Exercise - 03/17/20 1350      Transfers   Transfers Sit to Stand;Stand to Sit    Sit to Stand 5: Supervision;With upper extremity assist;From chair/3-in-1    Stand to Sit 5: Supervision;With upper extremity assist      Ambulation/Gait   Ambulation/Gait Yes    Ambulation/Gait Assistance 5: Supervision    Ambulation/Gait Assistance Details around gym with testing/session    Assistive device Rolling walker    Gait Pattern Step-through pattern;Decreased stride length;Decreased dorsiflexion - left;Decreased dorsiflexion - right;Decreased hip/knee flexion - right;Decreased hip/knee flexion - left;Left foot flat;Right genu recurvatum;Trunk flexed;Wide base of  support    Ambulation Surface Level;Indoor      Exercises   Exercises Other Exercises    Other Exercises  Refer to [employer] program for HEP.           Issued the following to the HEP today:  Access Code: [email or web address] URL: [email or web address] Date: 03/17/2020 Prepared by: Willow Ora  Exercises Sit to Stand with Counter Support - 1 x daily - 5 x weekly - 1 sets - 5 reps Seated Long Arc  Quad - 1 x daily - 5 x weekly - 1 sets - 10 reps      PT Education - 03/17/20 1729    Education Details results of Berg Balance Test and 3 minute walk test; HEP for strengthening    Person(s) Educated Patient    Methods Explanation;Demonstration;Verbal cues;Handout    Comprehension Verbalized understanding;Returned demonstration;Verbal cues required;Need further instruction            PT Short Term Goals - 03/14/20 1254      PT SHORT TERM GOAL #1   Title Patient will verbalize/demo understanding of HEP in order to increase LE strength and balance deficits. (TARGET DATE: 04/13/20)    Time 4    Period Weeks    Status New    Target Date 04/13/20      PT SHORT TERM GOAL #2   Title Pt will improve 5TSS to >/=24 secs with BUE support in order to indicate improved functional strength and dec fall risk.    Time 4    Period Weeks    Status New      PT SHORT TERM GOAL #3   Title Pt will complete BERG balance test and improve score by 4 points from baseline in order to ind dec fall risk.    Time 4    Period Weeks    Status New      PT SHORT TERM GOAL #4   Title Pt will improve gait speed to >/=2.10 ft/sec w/ LRAD in order to indicate dec fall risk.    Time 4    Period Weeks    Status New      PT SHORT TERM GOAL #5   Title Will assess 6MWT and update STG/add LTG to reflect improved endurance.    Time 4    Period Weeks    Status New             PT Long Term Goals - 03/14/20 1258      PT LONG TERM GOAL #1   Title Pt will be ind with final HEP in order to ind  improved functional mobility and dec fall risk.  (Target Date: 05/14/19)    Time 8    Period Weeks    Status New    Target Date 05/13/20      PT LONG TERM GOAL #2   Title Patient will have a gait speed of > or = 2.75ft/s indicating he is able to safely ambulate in the community.    Time 8    Period Weeks    Status New      PT LONG TERM GOAL #3   Title Pt will improve BERG balance score by 8 points from baseline in order to indicate dec fall risk.    Time 8    Period Weeks    Status New      PT LONG TERM GOAL #4   Title Pt will perform 5TSS in </=20 secs with single UE support in order to ind improved strength and dec fall risk.    Time 8    Period Weeks    Status New      PT LONG TERM GOAL #5   Title Pt will ambulate x 300' over unlevel paved surfaces with LRAD at mod I level in order to indicate improved community mobility and endurance.    Time 8    Period Weeks    Status New  Plan - 03/17/20 1324    Clinical Impression Statement Today's skilled session initially focused on completion of the Berg Balance test with pt scoring 29/56, placing him in the high fall risk category. Then session focused on obtaining pt's baseline distance with a 3 minute walk. Pt needing to sit at the 2 mintue ~30 second point and unable to resume before time ran out. Remainder of session focused on staring pt's HEP. The pt is progressing, does fatigue quickly. The pt should benefit from continued PT to progress toward unmet goals.    Personal Factors and Comorbidities Age;Comorbidity 3+;Time since onset of injury/illness/exacerbation    Comorbidities see above    Examination-Activity Limitations Locomotion Level;Lift;Squat;Stairs;Stand;Transfers    Examination-Participation Restrictions Community Activity;Cleaning;Laundry;Meal Prep    Stability/Clinical Decision Making Evolving/Moderate complexity    Rehab Potential Good    PT Frequency 2x / week   he may only do 1x due to  copay   PT Duration 8 weeks    PT Treatment/Interventions ADLs/Self Care Home Management;Aquatic Therapy;Electrical Stimulation;DME Instruction;Gait training;Stair training;Functional mobility training;Therapeutic activities;Therapeutic exercise;Balance training;Neuromuscular re-education;Cognitive remediation;Orthotic Fit/Training;Patient/family education;Passive range of motion;Energy conservation;Vestibular    PT Next Visit Plan add to HEP for BLE strengthening (see strength assessment for specific musle groups), sit<>stand, balance, gait    Consulted and Agree with Plan of Care Patient           Patient will benefit from skilled therapeutic intervention in order to improve the following deficits and impairments:  Abnormal gait, Cardiopulmonary status limiting activity, Decreased activity tolerance, Decreased balance, Decreased cognition, Decreased endurance, Decreased knowledge of use of DME, Decreased mobility, Decreased strength, Difficulty walking, Impaired flexibility, Impaired perceived functional ability, Impaired sensation, Postural dysfunction  Visit Diagnosis: Unsteadiness on feet  Muscle weakness (generalized)  Repeated falls  Other abnormalities of gait and mobility     Problem List Patient Active Problem List   Diagnosis Date Noted  . Cough 01/07/2019  . Labile blood glucose   . SOB (shortness of breath)   . COVID-19   . Urinary frequency   . Hypoalbuminemia due to protein-calorie malnutrition ([place])   . Supplemental oxygen dependent   . Controlled type 2 diabetes mellitus with hyperglycemia ([place])   . CKD (chronic kidney disease), stage III ([place])   . Benign essential HTN   . Physical debility 12/01/2018  . Pressure injury of skin 11/27/2018  . Acute respiratory failure with hypoxia ([place])   . 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 Acute respiratory disease due to COVID-19 virus 11/05/2018  . COVID-19 virus infection 11/03/2018  . CKD (chronic kidney disease)  stage 3, GFR 30-59 ml/min (HCC) 11/03/2018  . Type 2 diabetes mellitus ([place]) 11/03/2018  . Essential hypertension 11/03/2018  . Claudication in peripheral vascular disease ([place]) 06/10/2016  . Morbid obesity ([place]) 09/21/2015  . Upper airway cough syndrome 09/20/2015  . Cough variant asthma 09/20/2015    Willow Ora, PTA, [facility] [address], [place], [place] [address] [phone] 03/18/20, 5:34 PM   Name: ARBOR COHEN MRN: [id number] Date of Birth: 01-24-1941

## 2020-03-21 ENCOUNTER — Ambulatory Visit: Payer: Medicare HMO | Admitting: Adult Health

## 2020-03-23 ENCOUNTER — Ambulatory Visit: Payer: Medicare Other | Admitting: Physical Therapy

## 2020-03-23 ENCOUNTER — Other Ambulatory Visit: Payer: Self-pay

## 2020-03-23 ENCOUNTER — Encounter: Payer: Self-pay | Admitting: Physical Therapy

## 2020-03-23 DIAGNOSIS — R296 Repeated falls: Secondary | ICD-10-CM

## 2020-03-23 DIAGNOSIS — R2689 Other abnormalities of gait and mobility: Secondary | ICD-10-CM

## 2020-03-23 DIAGNOSIS — R2681 Unsteadiness on feet: Secondary | ICD-10-CM | POA: Diagnosis not present

## 2020-03-23 DIAGNOSIS — M6281 Muscle weakness (generalized): Secondary | ICD-10-CM

## 2020-03-27 NOTE — Therapy (Signed)
[facility] [address] [place], [place], [address] Phone: [phone]   Fax:  [phone]  Physical Therapy Treatment  Patient Details  Name: Andrew Larsen MRN: [id number] Date of Birth: 1940/05/17 Referring Provider (PT): Star Age, MD    Encounter Date: 03/23/2020     03/23/20 1235  PT Visits / Re-Eval  Visit Number 3  Number of Visits 17  Date for PT Re-Evaluation 27/03/50 ([place] for 90 days, 60 day POC)  Authorization  Authorization Type [employer] [facility] (needs 10th visit PN)  Progress Note Due on Visit 10  PT Time Calculation  PT Start Time 1233  PT Stop Time 1315  PT Time Calculation (min) 42 min  PT - End of Session  Equipment Utilized During Treatment Gait belt  Activity Tolerance Patient tolerated treatment well;Patient limited by fatigue  Behavior During Therapy [facility] for tasks assessed/performed    Past Medical History:  Diagnosis Date  . CKD (chronic kidney disease)    CKD stage III (01/2018)  . Daytime somnolence   . Diabetes mellitus without complication ([place])   . Fracture    right fibula/wears boot cast  . GERD (gastroesophageal reflux disease)   . Glaucoma   . Hypercholesteremia   . Hyperlipidemia   . Hypertension   . Mold exposure   . PAD (peripheral artery disease) ([place])   . Sleep apnea     Past Surgical History:  Procedure Laterality Date  . LOWER EXTREMITY ANGIOGRAPHY N/A 06/12/2016   Procedure: Lower Extremity Angiography;  Surgeon: Adrian Prows, MD;  Location: [facility];  Service: Cardiovascular;  Laterality: N/A;  . MUSCLE BIOPSY Right 05/06/2018   Procedure: right vastus lateralis muscle biopsy;  Surgeon: Earnie Larsson, MD;  Location: [facility];  Service: Neurosurgery;  Laterality: Right;  . ORIF ANKLE FRACTURE Right 10/02/2012   Procedure: OPEN REDUCTION INTERNAL FIXATION (ORIF) ANKLE FRACTURE;  Surgeon: Meredith Pel, MD;  Location: [facility];  Service: Orthopedics;  Laterality:  Right;  . PERIPHERAL VASCULAR ATHERECTOMY Left 06/12/2016   Procedure: Peripheral Vascular Atherectomy-Left Popliteal;  Surgeon: Adrian Prows, MD;  Location: [facility];  Service: Cardiovascular;  Laterality: Left;  . PERIPHERAL VASCULAR INTERVENTION Left 06/12/2016   Procedure: Peripheral Vascular Intervention- DCB Left Popliteal;  Surgeon: Adrian Prows, MD;  Location: [facility];  Service: Cardiovascular;  Laterality: Left;    There were no vitals filed for this visit.      03/23/20 1234  Symptoms/Limitations  Subjective Reports his stomach does not feel well toda, "I almost cancelled". Has tried the HEP. The sit<>stands hurt his knees. Using a low surface at home. Discussed trying from a higher surface.  Pertinent History Inclusion body myositis, HTN, HLD, DM, B ankle sx 2014 and R knee sx 2018, PVD, Glaucoma  Limitations Walking;Standing;House hold activities  How long can you stand comfortably? approx 5 mins  How long can you walk comfortably? approx 5 mins  Patient Stated Goals "To get stronger"  Pain Assessment  Currently in Pain? No/denies     03/23/20 1236  Transfers  Transfers Sit to Stand;Stand to Sit  Sit to Stand 5: Supervision;With upper extremity assist;From chair/3-in-1  Stand to Sit 5: Supervision;With upper extremity assist  Ambulation/Gait  Ambulation/Gait Yes  Ambulation/Gait Assistance 5: Supervision  Ambulation/Gait Assistance Details around gym wiith session  Assistive device Rolling walker  Gait Pattern Step-through pattern;Decreased stride length;Decreased dorsiflexion - left;Decreased dorsiflexion - right;Decreased hip/knee flexion - right;Decreased hip/knee flexion - left;Left foot  flat;Right genu recurvatum;Trunk flexed;Wide base of support  Ambulation Surface Level;Indoor  Knee/Hip Exercises: Aerobic  Other Aerobic Scifit level 1.5 x minutes with goal ~50 rpm for strengthening and activity tolerance.   Knee/Hip Exercises: Standing  Heel  Raises Both;10 reps;Limitations;2 sets  Heel Raises Limitations with UE support on sturdy surface. seated rest break between sets.   Knee/Hip Exercises: Seated  Long Arc Quad Strengthening;AROM;Both;2 sets;10 reps;Weights;Limitations  Long Arc Quad Weight 2 lbs.  Long Arc Quad Limitations bil LE's, alternating with cues for full knee extension. right>left knee lag noted. decreased to 1# on right LE with second set with improved knee extension noted.   Other Seated Knee/Hip Exercises with red band around knees, feet together: clamshells for 2 sets of 10 reps. Cues for bil LE's to move with the ex.   Marching AROM;Strengthening;Both;2 sets;10 reps;Weights;Limitations  Marching Limitations alternating sides with limited height noted with bil LE's.   Marching Weights 1 lbs.  Hamstring Curl AROM;Strengthening;Both;2 sets;10 reps;Limitations  Hamstring Limitations with foot on pillow case with yellow band resistance- sliding foot back/then forward for 2 sets each leg. Cues/assist for full range of motion         PT Short Term Goals - 03/14/20 1254      PT SHORT TERM GOAL #1   Title Patient will verbalize/demo understanding of HEP in order to increase LE strength and balance deficits. (TARGET DATE: 04/13/20)    Time 4    Period Weeks    Status New    Target Date 04/13/20      PT SHORT TERM GOAL #2   Title Pt will improve 5TSS to >/=24 secs with BUE support in order to indicate improved functional strength and dec fall risk.    Time 4    Period Weeks    Status New      PT SHORT TERM GOAL #3   Title Pt will complete BERG balance test and improve score by 4 points from baseline in order to ind dec fall risk.    Time 4    Period Weeks    Status New      PT SHORT TERM GOAL #4   Title Pt will improve gait speed to >/=2.10 ft/sec w/ LRAD in order to indicate dec fall risk.    Time 4    Period Weeks    Status New      PT SHORT TERM GOAL #5   Title Will assess 6MWT and update STG/add  LTG to reflect improved endurance.    Time 4    Period Weeks    Status New             PT Long Term Goals - 03/14/20 1258      PT LONG TERM GOAL #1   Title Pt will be ind with final HEP in order to ind improved functional mobility and dec fall risk.  (Target Date: 05/14/19)    Time 8    Period Weeks    Status New    Target Date 05/13/20      PT LONG TERM GOAL #2   Title Patient will have a gait speed of > or = 2.53ft/s indicating he is able to safely ambulate in the community.    Time 8    Period Weeks    Status New      PT LONG TERM GOAL #3   Title Pt will improve BERG balance score by 8 points from baseline in order to indicate dec fall  risk.    Time 8    Period Weeks    Status New      PT LONG TERM GOAL #4   Title Pt will perform 5TSS in </=20 secs with single UE support in order to ind improved strength and dec fall risk.    Time 8    Period Weeks    Status New      PT LONG TERM GOAL #5   Title Pt will ambulate x 300' over unlevel paved surfaces with LRAD at mod I level in order to indicate improved community mobility and endurance.    Time 8    Period Weeks    Status New             03/23/20 1235  Plan  Clinical Impression Statement Today's skilled session continued to focus on strengthening and activity tolerance with rest breaks taken as needed. No other issues noted or reported with session. The pt is progressing toward goals and should benefit from continued PT to progress toward unmet goals.  Personal Factors and Comorbidities Age;Comorbidity 3+;Time since onset of injury/illness/exacerbation  Comorbidities see above  Examination-Activity Limitations Locomotion Level;Lift;Squat;Stairs;Stand;Transfers  Examination-Participation Restrictions Community Activity;Cleaning;Laundry;Meal Prep  Pt will benefit from skilled therapeutic intervention in order to improve on the following deficits Abnormal gait;Cardiopulmonary status limiting activity;Decreased  activity tolerance;Decreased balance;Decreased cognition;Decreased endurance;Decreased knowledge of use of DME;Decreased mobility;Decreased strength;Difficulty walking;Impaired flexibility;Impaired perceived functional ability;Impaired sensation;Postural dysfunction  Stability/Clinical Decision Making Evolving/Moderate complexity  Rehab Potential Good  PT Frequency 2x / week (he may only do 1x due to copay)  PT Duration 8 weeks  PT Treatment/Interventions ADLs/Self Care Home Management;Aquatic Therapy;Electrical Stimulation;DME Instruction;Gait training;Stair training;Functional mobility training;Therapeutic activities;Therapeutic exercise;Balance training;Neuromuscular re-education;Cognitive remediation;Orthotic Fit/Training;Patient/family education;Passive range of motion;Energy conservation;Vestibular  PT Next Visit Plan add to HEP for BLE strengthening (see strength assessment for specific musle groups), sit<>stand, balance, gait  Consulted and Agree with Plan of Care Patient          Patient will benefit from skilled therapeutic intervention in order to improve the following deficits and impairments:  Abnormal gait, Cardiopulmonary status limiting activity, Decreased activity tolerance, Decreased balance, Decreased cognition, Decreased endurance, Decreased knowledge of use of DME, Decreased mobility, Decreased strength, Difficulty walking, Impaired flexibility, Impaired perceived functional ability, Impaired sensation, Postural dysfunction  Visit Diagnosis: Unsteadiness on feet  Muscle weakness (generalized)  Repeated falls  Other abnormalities of gait and mobility     Problem List Patient Active Problem List   Diagnosis Date Noted  . Cough 01/07/2019  . Labile blood glucose   . SOB (shortness of breath)   . COVID-19   . Urinary frequency   . Hypoalbuminemia due to protein-calorie malnutrition ([place])   . Supplemental oxygen dependent   . Controlled type 2 diabetes  mellitus with hyperglycemia ([place])   . CKD (chronic kidney disease), stage III ([employer])   . Benign essential HTN   . Physical debility 12/01/2018  . Pressure injury of skin 11/27/2018  . Acute respiratory failure with hypoxia ([place])   . 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 Acute respiratory disease due to COVID-19 virus 11/05/2018  . COVID-19 virus infection 11/03/2018  . CKD (chronic kidney disease) stage 3, GFR 30-59 ml/min (HCC) 11/03/2018  . Type 2 diabetes mellitus ([place]) 11/03/2018  . Essential hypertension 11/03/2018  . Claudication in peripheral vascular disease ([place]) 06/10/2016  . Morbid obesity ([place]) 09/21/2015  . Upper airway cough syndrome 09/20/2015  . Cough variant asthma 09/20/2015   Willow Ora, PTA, CLT  [facility] [address] [place], [place] [address] [phone] 03/27/20, 10:00 PM   Name: Andrew Larsen MRN: [id number] Date of Birth: [birth year]

## 2020-03-29 ENCOUNTER — Telehealth: Payer: Self-pay | Admitting: Neurology

## 2020-03-29 ENCOUNTER — Other Ambulatory Visit: Payer: Self-pay

## 2020-03-29 ENCOUNTER — Ambulatory Visit: Payer: Medicare Other | Admitting: Physical Therapy

## 2020-03-29 ENCOUNTER — Encounter: Payer: Self-pay | Admitting: Physical Therapy

## 2020-03-29 DIAGNOSIS — R2689 Other abnormalities of gait and mobility: Secondary | ICD-10-CM

## 2020-03-29 DIAGNOSIS — R2681 Unsteadiness on feet: Secondary | ICD-10-CM

## 2020-03-29 DIAGNOSIS — M6281 Muscle weakness (generalized): Secondary | ICD-10-CM

## 2020-03-29 NOTE — Telephone Encounter (Signed)
Pt. Came into the lobby today asking if he should continue to use his CPAP machine since it is recalled or if he should stop use. Best call back # [phone]

## 2020-03-29 NOTE — Telephone Encounter (Signed)
I have reached out to [employer] to find out what else they can do. Pt is not eligible for a new cpap per the last phone note regarding this.

## 2020-03-29 NOTE — Telephone Encounter (Signed)
Unfortunately, if he has used an ozone cleaner on his recalled CPAP machine, it is come to our attention that the problem with the recall has to do with patients who used an ozone based cleaner on their machines and/or if there was high heat exposure to the machine itself.  As it stands, I do not have any more information myself.  He is encouraged to stay in touch with the [employer] customer service about updates on replacing the machines.  I do not mind writing for a new CPAP machine but if he is not eligible through the insurance, I am not sure what else we can do other than having him pay out-of-pocket for a new machine and he can certainly talk to [employer] about this as well. I do not know in what timeframe the [employer] machines are being replaced.  That is unfortunately out of our control.

## 2020-03-29 NOTE — Therapy (Signed)
[facility] [address] [place], [place], [address] Phone: [phone]   Fax:  [phone]  Physical Therapy Treatment  Patient Details  Name: Andrew Larsen MRN: [id number] Date of Birth: 01-16-1941 Referring Provider (PT): Star Age, MD    Encounter Date: 03/29/2020   PT End of Session - 03/29/20 2128    Visit Number 4    Number of Visits 17    Date for PT Re-Evaluation 30/09/23   cert for 90 days, 60 day POC   Authorization Type [employer] [facility] (needs 10th visit PN)    Progress Note Due on Visit 10    PT Start Time 1104    PT Stop Time 1150    PT Time Calculation (min) 46 min    Equipment Utilized During Treatment Gait belt    Activity Tolerance Patient tolerated treatment well;Patient limited by fatigue    Behavior During Therapy [facility] for tasks assessed/performed           Past Medical History:  Diagnosis Date  . CKD (chronic kidney disease)    CKD stage III (01/2018)  . Daytime somnolence   . Diabetes mellitus without complication ([place])   . Fracture    right fibula/wears boot cast  . GERD (gastroesophageal reflux disease)   . Glaucoma   . Hypercholesteremia   . Hyperlipidemia   . Hypertension   . Mold exposure   . PAD (peripheral artery disease) ([place])   . Sleep apnea     Past Surgical History:  Procedure Laterality Date  . LOWER EXTREMITY ANGIOGRAPHY N/A 06/12/2016   Procedure: Lower Extremity Angiography;  Surgeon: Adrian Prows, MD;  Location: [facility];  Service: Cardiovascular;  Laterality: N/A;  . MUSCLE BIOPSY Right 05/06/2018   Procedure: right vastus lateralis muscle biopsy;  Surgeon: Earnie Larsson, MD;  Location: [facility];  Service: Neurosurgery;  Laterality: Right;  . ORIF ANKLE FRACTURE Right 10/02/2012   Procedure: OPEN REDUCTION INTERNAL FIXATION (ORIF) ANKLE FRACTURE;  Surgeon: Meredith Pel, MD;  [facility];  Service: Orthopedics;  Laterality: Right;  . PERIPHERAL VASCULAR  ATHERECTOMY Left 06/12/2016   Procedure: Peripheral Vascular Atherectomy-Left Popliteal;  Surgeon: Adrian Prows, MD;  Location: [facility];  Service: Cardiovascular;  Laterality: Left;  . PERIPHERAL VASCULAR INTERVENTION Left 06/12/2016   Procedure: Peripheral Vascular Intervention- DCB Left Popliteal;  Surgeon: Adrian Prows, MD;  Location: [facility];  Service: Cardiovascular;  Laterality: Left;    There were no vitals filed for this visit.   Subjective Assessment - 03/29/20 1107    Subjective Pt reports no problems or changes - states he doesn't have a good firm chair to use for the sit to stand transfer exercise - but is going to get one    Pertinent History Inclusion body myositis, HTN, HLD, DM, B ankle sx 2014 and R knee sx 2018, PVD, Glaucoma    Limitations Walking;Standing;House hold activities    How long can you stand comfortably? approx 5 mins    How long can you walk comfortably? approx 5 mins    Patient Stated Goals "To get stronger"    Currently in Pain? No/denies                             [facility] Adult PT Treatment/Exercise - 03/29/20 0001      Transfers   Transfers Sit to Stand;Stand to Sit    Sit to Stand  5: Supervision    Stand to Sit 5: Supervision    Comments from high/low mat table      Knee/Hip Exercises: Aerobic   Recumbent Bike Scifit level 3.5 x 10" with UE's and LE's      Knee/Hip Exercises: Standing   Heel Raises Both;1 set;10 reps    Hip Flexion Stengthening;Right;Left;1 set;10 reps;Knee straight    Hip Abduction Stengthening;Right;Left;1 set;10 reps;Knee straight   yellow theraband   Forward Step Up 1 set;Left;10 reps;Step Height: 6";Hand Hold: 2    Other Standing Knee Exercises tap ups to 1st step with bil. hand rails for UE support 10 reps each       Knee/Hip Exercises: Seated   Long Arc Quad Strengthening;Right;Left;1 set;10 reps   with yellow theraband   Hamstring Curl Strengthening;Right;Left;1 set;10 reps   yellow  theraband         NeuroRe-ed:  Tap ups to 1st step 5 reps each foot with bil. UE support on hand rails  Marching in place with bil. UE support on back of chair with CGA  Stepping strategy - forward/back 5 reps each with UE support with min assist          PT Short Term Goals - 03/29/20 2131      PT SHORT TERM GOAL #1   Title Patient will verbalize/demo understanding of HEP in order to increase LE strength and balance deficits. (TARGET DATE: 04/13/20)    Time 4    Period Weeks    Status New    Target Date 04/13/20      PT SHORT TERM GOAL #2   Title Pt will improve 5TSS to >/=24 secs with BUE support in order to indicate improved functional strength and dec fall risk.    Time 4    Period Weeks    Status New      PT SHORT TERM GOAL #3   Title Pt will complete BERG balance test and improve score by 4 points from baseline in order to ind dec fall risk.    Time 4    Period Weeks    Status New      PT SHORT TERM GOAL #4   Title Pt will improve gait speed to >/=2.10 ft/sec w/ LRAD in order to indicate dec fall risk.    Time 4    Period Weeks    Status New      PT SHORT TERM GOAL #5   Title Will assess 6MWT and update STG/add LTG to reflect improved endurance.    Time 4    Period Weeks    Status New             PT Long Term Goals - 03/29/20 2131      PT LONG TERM GOAL #1   Title Pt will be ind with final HEP in order to ind improved functional mobility and dec fall risk.  (Target Date: 05/14/19)    Time 8    Period Weeks    Status New      PT LONG TERM GOAL #2   Title Patient will have a gait speed of > or = 2.72ft/s indicating he is able to safely ambulate in the community.    Time 8    Period Weeks    Status New      PT LONG TERM GOAL #3   Title Pt will improve BERG balance score by 8 points from baseline in order to indicate dec fall risk.  Time 8    Period Weeks    Status New      PT LONG TERM GOAL #4   Title Pt will perform 5TSS in </=20 secs  with single UE support in order to ind improved strength and dec fall risk.    Time 8    Period Weeks    Status New      PT LONG TERM GOAL #5   Title Pt will ambulate x 300' over unlevel paved surfaces with LRAD at mod I level in order to indicate improved community mobility and endurance.    Time 8    Period Weeks    Status New                 Plan - 03/29/20 1108    Clinical Impression Statement Pt has Rt quad weakness with MMT grade 3-/5; pt hyperextends Rt knee for stability and is unable to perform SLS on RLE.  Pt also unable to perform step up onto 6" step with RLE leading due to quad weakness.  Pt needs UE support for safety with standing balance exercises.  Cont with POC.    Personal Factors and Comorbidities Age;Comorbidity 3+;Time since onset of injury/illness/exacerbation    Comorbidities see above    Examination-Activity Limitations Locomotion Level;Lift;Squat;Stairs;Stand;Transfers    Examination-Participation Restrictions Community Activity;Cleaning;Laundry;Meal Prep    Stability/Clinical Decision Making Evolving/Moderate complexity    Rehab Potential Good    PT Frequency 2x / week   he may only do 1x due to copay   PT Duration 8 weeks    PT Treatment/Interventions ADLs/Self Care Home Management;Aquatic Therapy;Electrical Stimulation;DME Instruction;Gait training;Stair training;Functional mobility training;Therapeutic activities;Therapeutic exercise;Balance training;Neuromuscular re-education;Cognitive remediation;Orthotic Fit/Training;Patient/family education;Passive range of motion;Energy conservation;Vestibular    PT Next Visit Plan add to HEP for BLE strengthening (see strength assessment for specific musle groups), sit<>stand, balance, gait    Consulted and Agree with Plan of Care Patient           Patient will benefit from skilled therapeutic intervention in order to improve the following deficits and impairments:  Abnormal gait, Cardiopulmonary status  limiting activity, Decreased activity tolerance, Decreased balance, Decreased cognition, Decreased endurance, Decreased knowledge of use of DME, Decreased mobility, Decreased strength, Difficulty walking, Impaired flexibility, Impaired perceived functional ability, Impaired sensation, Postural dysfunction  Visit Diagnosis: Unsteadiness on feet  Muscle weakness (generalized)  Other abnormalities of gait and mobility     Problem List Patient Active Problem List   Diagnosis Date Noted  . Cough 01/07/2019  . Labile blood glucose   . SOB (shortness of breath)   . COVID-19   . Urinary frequency   . Hypoalbuminemia due to protein-calorie malnutrition ([place])   . Supplemental oxygen dependent   . Controlled type 2 diabetes mellitus with hyperglycemia ([place])   . CKD (chronic kidney disease), stage III ([place])   . Benign essential HTN   . Physical debility 12/01/2018  . Pressure injury of skin 11/27/2018  . Acute respiratory failure with hypoxia ([place])   . 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 Acute respiratory disease due to COVID-19 virus 11/05/2018  . COVID-19 virus infection 11/03/2018  . CKD (chronic kidney disease) stage 3, GFR 30-59 ml/min (H[facility]) 11/03/2018  . Type 2 diabetes mellitus ([place]) 11/03/2018  . Essential hypertension 11/03/2018  . Claudication in peripheral vascular disease ([place]) 06/10/2016  . Morbid obesity ([place]) 09/21/2015  . Upper airway cough syndrome 09/20/2015  . Cough variant asthma 09/20/2015    DildayJenness Corner, PT 03/29/2020, 9:33  PM  [facility] [address] [place], [place], [address] Phone: [phone]   Fax:  [phone]  Name: GIANNO VOLNER MRN: [id number] Date of Birth: June 17, 1940

## 2020-03-29 NOTE — Telephone Encounter (Signed)
I called pt. I discussed this with him. He will continue with close follow up with [employer] and [employer] regarding his cpap and replacement. I will let him know of any further information we get.

## 2020-03-29 NOTE — Telephone Encounter (Addendum)
[employer]'s manager has let me know that DMEs cannot replace the [employer] cpaps. Only [employer] can replace these machines that are recalled. Pt was using ozone cleaner with his cpap. Pt has registered his machine with [employer]. It is unclear when [employer] will replace his machine. Pt should follow up with [employer] on this.

## 2020-03-30 NOTE — Telephone Encounter (Signed)
Jeneen Rinks, [employer]'s manager, is able to provide pt a loaner cpap while he is waiting on a replacement cpap from [employer]. I have sent the order for a new cpap to [employer].  I called pt. I advised him of this. He is very [employer] attorney.

## 2020-03-31 ENCOUNTER — Ambulatory Visit: Payer: Medicare Other | Admitting: Physical Therapy

## 2020-04-05 ENCOUNTER — Ambulatory Visit: Payer: Medicare Other | Admitting: Physical Therapy

## 2020-04-05 ENCOUNTER — Other Ambulatory Visit: Payer: Self-pay

## 2020-04-05 ENCOUNTER — Ambulatory Visit: Payer: Medicare Other | Attending: Neurology | Admitting: Physical Therapy

## 2020-04-05 DIAGNOSIS — R2681 Unsteadiness on feet: Secondary | ICD-10-CM | POA: Diagnosis present

## 2020-04-05 DIAGNOSIS — R2689 Other abnormalities of gait and mobility: Secondary | ICD-10-CM | POA: Diagnosis present

## 2020-04-05 DIAGNOSIS — M6281 Muscle weakness (generalized): Secondary | ICD-10-CM | POA: Diagnosis present

## 2020-04-06 ENCOUNTER — Encounter: Payer: Self-pay | Admitting: Physical Therapy

## 2020-04-06 NOTE — Therapy (Signed)
[facility] [address] [place], [place], [address] Phone: [phone]   Fax:  [phone]  Physical Therapy Treatment  Patient Details  Name: Andrew Larsen MRN: [id number] Date of Birth: 10/06/1940 Referring Provider (PT): Star Age, MD    Encounter Date: 04/05/2020   PT End of Session - 04/06/20 1615    Visit Number 5    Number of Visits 17    Date for PT Re-Evaluation 73/71/06   cert for 90 days, 60 day POC   Authorization Type [employer] MC (needs 10th visit PN)    Progress Note Due on Visit 10    PT Start Time 1403    PT Stop Time 1446    PT Time Calculation (min) 43 min    Equipment Utilized During Treatment Gait belt    Activity Tolerance Patient tolerated treatment well    Behavior During Therapy WFL for tasks assessed/performed           Past Medical History:  Diagnosis Date  . CKD (chronic kidney disease)    CKD stage III (01/2018)  . Daytime somnolence   . Diabetes mellitus without complication ([place])   . Fracture    right fibula/wears boot cast  . GERD (gastroesophageal reflux disease)   . Glaucoma   . Hypercholesteremia   . Hyperlipidemia   . Hypertension   . Mold exposure   . PAD (peripheral artery disease) ([place])   . Sleep apnea     Past Surgical History:  Procedure Laterality Date  . LOWER EXTREMITY ANGIOGRAPHY N/A 06/12/2016   Procedure: Lower Extremity Angiography;  Surgeon: Adrian Prows, MD;  Location: [facility];  Service: Cardiovascular;  Laterality: N/A;  . MUSCLE BIOPSY Right 05/06/2018   Procedure: right vastus lateralis muscle biopsy;  Surgeon: Earnie Larsson, MD;  Location: [facility];  Service: Neurosurgery;  Laterality: Right;  . ORIF ANKLE FRACTURE Right 10/02/2012   Procedure: OPEN REDUCTION INTERNAL FIXATION (ORIF) ANKLE FRACTURE;  Surgeon: Meredith Pel, MD;  [facility];  Service: Orthopedics;  Laterality: Right;  . PERIPHERAL VASCULAR ATHERECTOMY Left 06/12/2016    Procedure: Peripheral Vascular Atherectomy-Left Popliteal;  Surgeon: Adrian Prows, MD;  Location: [facility];  Service: Cardiovascular;  Laterality: Left;  . PERIPHERAL VASCULAR INTERVENTION Left 06/12/2016   Procedure: Peripheral Vascular Intervention- DCB Left Popliteal;  Surgeon: Adrian Prows, MD;  Location: [facility];  Service: Cardiovascular;  Laterality: Left;    There were no vitals filed for this visit.   Subjective Assessment - 04/05/20 1407    Subjective Pt reports he feels weak today [place]    Pertinent History Inclusion body myositis, HTN, HLD, DM, B ankle sx 2014 and R knee sx 2018, PVD, Glaucoma    Limitations Walking;Standing;House hold activities    How long can you stand comfortably? approx 5 mins    How long can you walk comfortably? approx 5 mins    Patient Stated Goals "To get stronger"    Currently in Pain? No/denies                             [facility] Adult PT Treatment/Exercise - 04/06/20 0001      Transfers   Transfers Sit to Stand;Stand to Sit    Sit to Stand 5: Supervision    Stand to Sit 5: Supervision    Comments from mat table - wtith  bil. UE support      Ambulation/Gait   Ambulation/Gait Yes    Ambulation/Gait Assistance 5: Supervision    Ambulation Distance (Feet) 100 Feet    Assistive device Rolling walker    Gait Pattern Step-through pattern;Decreased stride length;Decreased dorsiflexion - left;Decreased dorsiflexion - right;Decreased hip/knee flexion - right;Decreased hip/knee flexion - left;Left foot flat;Right genu recurvatum;Trunk flexed;Wide base of support    Ambulation Surface Level;Indoor      Exercises   Exercises Knee/Hip      Knee/Hip Exercises: Aerobic   Recumbent Bike Scifit level 3.0 x 5" with UE's and LE's      Knee/Hip Exercises: Standing   Hip Flexion Stengthening;Right;Left;1 set;10 reps;Knee straight   3# weight   Hip Abduction Stengthening;Right;Left;1 set;10  reps;Knee straight   3# weight   Hip Extension Stengthening;Right;Left;1 set;10 reps;Knee straight   3# weight   Forward Step Up Left;1 set;10 reps;Hand Hold: 2;Step Height: 6"    Step Down Right;Left;1 set;10 reps;Hand Hold: 2;Step Height: 4"   inside // bars   Other Standing Knee Exercises tap ups to 1st step with bil. hand rails for UE support 10 reps each       Knee/Hip Exercises: Supine   Bridges AROM;Both;1 set;10 reps    Other Supine Knee/Hip Exercises Bridges with LE extension 5 reps each leg     Other Supine Knee/Hip Exercises hip abduction with red theraband in hooklying position 10 reps                    PT Short Term Goals - 04/06/20 1623      PT SHORT TERM GOAL #1   Title Patient will verbalize/demo understanding of HEP in order to increase LE strength and balance deficits. (TARGET DATE: 04/13/20)    Time 4    Period Weeks    Status New    Target Date 04/13/20      PT SHORT TERM GOAL #2   Title Pt will improve 5TSS to >/=24 secs with BUE support in order to indicate improved functional strength and dec fall risk.    Time 4    Period Weeks    Status New      PT SHORT TERM GOAL #3   Title Pt will complete BERG balance test and improve score by 4 points from baseline in order to ind dec fall risk.    Time 4    Period Weeks    Status New      PT SHORT TERM GOAL #4   Title Pt will improve gait speed to >/=2.10 ft/sec w/ LRAD in order to indicate dec fall risk.    Time 4    Period Weeks    Status New      PT SHORT TERM GOAL #5   Title Will assess 6MWT and update STG/add LTG to reflect improved endurance.    Time 4    Period Weeks    Status New             PT Long Term Goals - 04/06/20 1624      PT LONG TERM GOAL #1   Title Pt will be ind with final HEP in order to ind improved functional mobility and dec fall risk.  (Target Date: 05/14/19)    Time 8    Period Weeks    Status New      PT LONG TERM GOAL #2   Title Patient will have a gait  speed of > or = 2.86ft/s  indicating he is able to safely ambulate in the community.    Time 8    Period Weeks    Status New      PT LONG TERM GOAL #3   Title Pt will improve BERG balance score by 8 points from baseline in order to indicate dec fall risk.    Time 8    Period Weeks    Status New      PT LONG TERM GOAL #4   Title Pt will perform 5TSS in </=20 secs with single UE support in order to ind improved strength and dec fall risk.    Time 8    Period Weeks    Status New      PT LONG TERM GOAL #5   Title Pt will ambulate x 300' over unlevel paved surfaces with LRAD at mod I level in order to indicate improved community mobility and endurance.    Time 8    Period Weeks    Status New                 Plan - 04/05/20 1409    Clinical Impression Statement Pt needs bil. UE support for safety with amb. due to Rt quad weakness; pt unable to step up onto step with RLE leading due to quad weakness.  Pt is unable to extend Rt quad in closed chain position.  Due to severity of Rt quad weakness pt does not have potential to progress from RW to lesser assistive device due to high fall risk.  Cont with POC.    Personal Factors and Comorbidities Age;Comorbidity 3+;Time since onset of injury/illness/exacerbation    Comorbidities see above    Examination-Activity Limitations Locomotion Level;Lift;Squat;Stairs;Stand;Transfers    Examination-Participation Restrictions Community Activity;Cleaning;Laundry;Meal Prep    Stability/Clinical Decision Making Evolving/Moderate complexity    Rehab Potential Good    PT Frequency 2x / week   he may only do 1x due to copay   PT Duration 8 weeks    PT Treatment/Interventions ADLs/Self Care Home Management;Aquatic Therapy;Electrical Stimulation;DME Instruction;Gait training;Stair training;Functional mobility training;Therapeutic activities;Therapeutic exercise;Balance training;Neuromuscular re-education;Cognitive remediation;Orthotic  Fit/Training;Patient/family education;Passive range of motion;Energy conservation;Vestibular    PT Next Visit Plan sit<>stand, balance, gait - pt to bring knee brace to next PT session    Consulted and Agree with Plan of Care Patient           Patient will benefit from skilled therapeutic intervention in order to improve the following deficits and impairments:  Abnormal gait, Cardiopulmonary status limiting activity, Decreased activity tolerance, Decreased balance, Decreased cognition, Decreased endurance, Decreased knowledge of use of DME, Decreased mobility, Decreased strength, Difficulty walking, Impaired flexibility, Impaired perceived functional ability, Impaired sensation, Postural dysfunction  Visit Diagnosis: Unsteadiness on feet  Muscle weakness (generalized)  Other abnormalities of gait and mobility     Problem List Patient Active Problem List   Diagnosis Date Noted  . Cough 01/07/2019  . Labile blood glucose   . SOB (shortness of breath)   . COVID-19   . Urinary frequency   . Hypoalbuminemia due to protein-calorie malnutrition ([place])   . Supplemental oxygen dependent   . Controlled type 2 diabetes mellitus with hyperglycemia ([place])   . CKD (chronic kidney disease), stage III ([place])   . Benign essential HTN   . Physical debility 12/01/2018  . Pressure injury of skin 11/27/2018  . Acute respiratory failure with hypoxia ([place])   . 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 Acute respiratory disease due to COVID-19 virus 11/05/2018  .  COVID-19 virus infection 11/03/2018  . CKD (chronic kidney disease) stage 3, GFR 30-59 ml/min (HCC) 11/03/2018  . Type 2 diabetes mellitus ([place]) 11/03/2018  . Essential hypertension 11/03/2018  . Claudication in peripheral vascular disease ([place]) 06/10/2016  . Morbid obesity ([place]) 09/21/2015  . Upper airway cough syndrome 09/20/2015  . Cough variant asthma 09/20/2015    DildayJenness Corner, PT 04/06/2020, 4:25 PM  [facility] [address] [place], [place], [address] Phone: [phone]   Fax:  [phone]  Name: Andrew Larsen MRN: [id number] Date of Birth: 1940-12-18

## 2020-04-07 ENCOUNTER — Ambulatory Visit: Payer: Medicare Other | Admitting: Rehabilitation

## 2020-04-12 ENCOUNTER — Other Ambulatory Visit: Payer: Self-pay

## 2020-04-12 ENCOUNTER — Ambulatory Visit: Payer: Medicare Other | Admitting: Physical Therapy

## 2020-04-12 DIAGNOSIS — R2681 Unsteadiness on feet: Secondary | ICD-10-CM | POA: Diagnosis not present

## 2020-04-12 DIAGNOSIS — M6281 Muscle weakness (generalized): Secondary | ICD-10-CM

## 2020-04-12 DIAGNOSIS — R2689 Other abnormalities of gait and mobility: Secondary | ICD-10-CM

## 2020-04-13 ENCOUNTER — Encounter: Payer: Self-pay | Admitting: Physical Therapy

## 2020-04-13 NOTE — Therapy (Signed)
[facility] [address] [place], [place], [address] Phone: [phone]   Fax:  [phone]  Physical Therapy Treatment  Patient Details  Name: Andrew Larsen MRN: [id number] Date of Birth: 07/05/1940 Referring Provider (PT): Star Age, MD    Encounter Date: 04/12/2020   PT End of Session - 04/13/20 1830    Visit Number 6    Number of Visits 17    Date for PT Re-Evaluation 10/28/14   cert for 90 days, 60 day POC   Authorization Type [employer] MC (needs 10th visit PN)    Progress Note Due on Visit 10    PT Start Time 1150    PT Stop Time 1235    PT Time Calculation (min) 45 min    Activity Tolerance Patient tolerated treatment well    Behavior During Therapy [facility] for tasks assessed/performed           Past Medical History:  Diagnosis Date  . CKD (chronic kidney disease)    CKD stage III (01/2018)  . Daytime somnolence   . Diabetes mellitus without complication ([place])   . Fracture    right fibula/wears boot cast  . GERD (gastroesophageal reflux disease)   . Glaucoma   . Hypercholesteremia   . Hyperlipidemia   . Hypertension   . Mold exposure   . PAD (peripheral artery disease) ([place])   . Sleep apnea     Past Surgical History:  Procedure Laterality Date  . LOWER EXTREMITY ANGIOGRAPHY N/A 06/12/2016   Procedure: Lower Extremity Angiography;  Surgeon: Adrian Prows, MD;  Location: [facility];  Service: Cardiovascular;  Laterality: N/A;  . MUSCLE BIOPSY Right 05/06/2018   Procedure: right vastus lateralis muscle biopsy;  Surgeon: Earnie Larsson, MD;  Location: [facility];  Service: Neurosurgery;  Laterality: Right;  . ORIF ANKLE FRACTURE Right 10/02/2012   Procedure: OPEN REDUCTION INTERNAL FIXATION (ORIF) ANKLE FRACTURE;  Surgeon: Meredith Pel, MD;  [facility];  Service: Orthopedics;  Laterality: Right;  . PERIPHERAL VASCULAR ATHERECTOMY Left 06/12/2016   Procedure: Peripheral Vascular Atherectomy-Left  Popliteal;  Surgeon: Adrian Prows, MD;  Location: [facility];  Service: Cardiovascular;  Laterality: Left;  . PERIPHERAL VASCULAR INTERVENTION Left 06/12/2016   Procedure: Peripheral Vascular Intervention- DCB Left Popliteal;  Surgeon: Adrian Prows, MD;  Location: [facility];  Service: Cardiovascular;  Laterality: Left;    There were no vitals filed for this visit.   Subjective Assessment - 04/13/20 1823    Subjective Pt states he was sick with stomach bug last week but is feeling better today; could not find his brace (AFO?) for RLE    Pertinent History Inclusion body myositis, HTN, HLD, DM, B ankle sx 2014 and R knee sx 2018, PVD, Glaucoma    Limitations Walking;Standing;House hold activities    How long can you stand comfortably? approx 5 mins    How long can you walk comfortably? approx 5 mins    Patient Stated Goals "To get stronger"    Currently in Pain? No/denies                             [facility] Adult PT Treatment/Exercise - 04/13/20 0001      Transfers   Transfers Sit to Stand;Stand to Sit    Sit to Stand 5: Supervision    Stand to Sit 5: Supervision    Comments from mat table - wtith bil.  UE support      Knee/Hip Exercises: Aerobic   Recumbent Bike Scifit level 3.0 x 5" with UE's and LE's      Knee/Hip Exercises: Machines for Strengthening   Cybex Leg Press Bil. LE's 60# 15 reps:  RLE only 30# 15 reps:  LLE only 40# 15 reps      Knee/Hip Exercises: Standing   Hip Flexion Stengthening;Right;Left;1 set;10 reps;Knee straight   2# weight   Hip Abduction Stengthening;Right;Left;1 set;10 reps;Knee straight   2# weight   Hip Extension Stengthening;Right;Left;1 set;10 reps;Knee straight   2# weight   Forward Step Up Left;1 set;10 reps;Hand Hold: 2;Step Height: 6"    Other Standing Knee Exercises tap ups to 1st step with bil. hand rails for UE support 10 reps each       Knee/Hip Exercises: Seated   Long Arc Quad AROM;Right;1 set;5 reps                   PT Education - 04/13/20 1828    Education Details discussed need for brace for RLE if pt is going to potentially progress from [place] for assistance with amb.    Person(s) Educated Patient    Methods Explanation    Comprehension Verbalized understanding            PT Short Term Goals - 04/13/20 1833      PT SHORT TERM GOAL #1   Title Patient will verbalize/demo understanding of HEP in order to increase LE strength and balance deficits. (TARGET DATE: 04/13/20)    Time 4    Period Weeks    Status New    Target Date 04/13/20      PT SHORT TERM GOAL #2   Title Pt will improve 5TSS to >/=24 secs with BUE support in order to indicate improved functional strength and dec fall risk.    Time 4    Period Weeks    Status New      PT SHORT TERM GOAL #3   Title Pt will complete BERG balance test and improve score by 4 points from baseline in order to ind dec fall risk.    Time 4    Period Weeks    Status New      PT SHORT TERM GOAL #4   Title Pt will improve gait speed to >/=2.10 ft/sec w/ LRAD in order to indicate dec fall risk.    Time 4    Period Weeks    Status New      PT SHORT TERM GOAL #5   Title Will assess 6MWT and update STG/add LTG to reflect improved endurance.    Time 4    Period Weeks    Status New             PT Long Term Goals - 04/13/20 1834      PT LONG TERM GOAL #1   Title Pt will be ind with final HEP in order to ind improved functional mobility and dec fall risk.  (Target Date: 05/14/19)    Time 8    Period Weeks    Status New      PT LONG TERM GOAL #2   Title Patient will have a gait speed of > or = 2.60ft/s indicating he is able to safely ambulate in the community.    Time 8    Period Weeks    Status New      PT LONG TERM GOAL #3   Title Pt will improve  BERG balance score by 8 points from baseline in order to indicate dec fall risk.    Time 8    Period Weeks    Status New      PT LONG TERM GOAL #4   Title Pt will perform  5TSS in </=20 secs with single UE support in order to ind improved strength and dec fall risk.    Time 8    Period Weeks    Status New      PT LONG TERM GOAL #5   Title Pt will ambulate x 300' over unlevel paved surfaces with LRAD at mod I level in order to indicate improved community mobility and endurance.    Time 8    Period Weeks    Status New                 Plan - 04/13/20 1830    Clinical Impression Statement Pt's Rt quad strength is 3-/5; pt hyperextends knee for stability to prevent buckling of knee in stance phase of gait.  Pt would benefit from orthosis for Rt knee stability.  Without orthosis pt does not have potential to progress to lesser assistive device for assistance with ambulation.  Cont with POC.    Personal Factors and Comorbidities Age;Comorbidity 3+;Time since onset of injury/illness/exacerbation    Comorbidities see above    Examination-Activity Limitations Locomotion Level;Lift;Squat;Stairs;Stand;Transfers    Examination-Participation Restrictions Community Activity;Cleaning;Laundry;Meal Prep    Stability/Clinical Decision Making Evolving/Moderate complexity    Rehab Potential Good    PT Frequency 2x / week   he may only do 1x due to copay   PT Duration 8 weeks    PT Treatment/Interventions ADLs/Self Care Home Management;Aquatic Therapy;Electrical Stimulation;DME Instruction;Gait training;Stair training;Functional mobility training;Therapeutic activities;Therapeutic exercise;Balance training;Neuromuscular re-education;Cognitive remediation;Orthotic Fit/Training;Patient/family education;Passive range of motion;Energy conservation;Vestibular    PT Next Visit Plan check STG's: sit<>stand, balance, gait training    Consulted and Agree with Plan of Care Patient           Patient will benefit from skilled therapeutic intervention in order to improve the following deficits and impairments:  Abnormal gait,Cardiopulmonary status limiting activity,Decreased  activity tolerance,Decreased balance,Decreased cognition,Decreased endurance,Decreased knowledge of use of DME,Decreased mobility,Decreased strength,Difficulty walking,Impaired flexibility,Impaired perceived functional ability,Impaired sensation,Postural dysfunction  Visit Diagnosis: Unsteadiness on feet  Muscle weakness (generalized)  Other abnormalities of gait and mobility     Problem List Patient Active Problem List   Diagnosis Date Noted  . Cough 01/07/2019  . Labile blood glucose   . SOB (shortness of breath)   . COVID-19   . Urinary frequency   . Hypoalbuminemia due to protein-calorie malnutrition ([place])   . Supplemental oxygen dependent   . Controlled type 2 diabetes mellitus with hyperglycemia ([place])   . CKD (chronic kidney disease), stage III ([place])   . Benign essential HTN   . Physical debility 12/01/2018  . Pressure injury of skin 11/27/2018  . Acute respiratory failure with hypoxia ([place])   . 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 Acute respiratory disease due to COVID-19 virus 11/05/2018  . COVID-19 virus infection 11/03/2018  . CKD (chronic kidney disease) stage 3, GFR 30-59 ml/min (HCC) 11/03/2018  . Type 2 diabetes mellitus ([place]) 11/03/2018  . Essential hypertension 11/03/2018  . Claudication in peripheral vascular disease ([place]) 06/10/2016  . Morbid obesity ([place]) 09/21/2015  . Upper airway cough syndrome 09/20/2015  . Cough variant asthma 09/20/2015    DildayJenness Corner, PT 04/13/2020, 6:36 PM  [facility]  [address] [place], [place], [address] Phone: [phone]   Fax:  [phone]  Name: Andrew Larsen MRN: [id number] Date of Birth: 1940-07-07

## 2020-04-14 ENCOUNTER — Ambulatory Visit: Payer: Medicare Other | Admitting: Physical Therapy

## 2020-04-14 ENCOUNTER — Other Ambulatory Visit: Payer: Self-pay

## 2020-04-14 DIAGNOSIS — M6281 Muscle weakness (generalized): Secondary | ICD-10-CM

## 2020-04-14 DIAGNOSIS — R2681 Unsteadiness on feet: Secondary | ICD-10-CM

## 2020-04-14 DIAGNOSIS — R2689 Other abnormalities of gait and mobility: Secondary | ICD-10-CM

## 2020-04-14 NOTE — Patient Instructions (Signed)
°  Aquatic Therapy: What to Expect! ° °Where:  °[facility] °[address] °[place], [place]  [address] °[phone] ° °NOTE:  You will receive an automated phone message reminding you of your appointment and it will say the appointment is at the Rehab Center on [address]  We are working to fix this- just know that you will meet us at the pool! ° °How to Prepare: °• Please make sure you drink 8 ounces of water about one hour prior to your pool session °• A caregiver MUST attend the entire session with the patient.  The caregiver will be responsible for assisting with dressing as well as any toileting needs.  If the patient will be doing a home program this should likely be the person who will assist as well.  °• Patients must wear either their street shoes or pool shoes until they are ready to enter the pool with the therapist.  Patients must also wear either street shoes or pool shoes once exiting the pool to walk to the locker room.  This will helps us prevent slips and falls.  °• Please arrive 15 minutes early to prepare for your pool therapy session °• Sign in at the front desk on the clipboard marked for [place] °• You may use the locker rooms on your right and then enter directly into the recreation pool (NOT the competition pool) °• Please make sure to attend to any toileting needs prior to entering the pool °• Please be dressed in your swim suit and on the pool deck at least 5 minutes before your appointment °• Once on the pool deck your therapist will ask you to sign the Patient  Consent and Assignment of Benefits form °• Your therapist may take your blood pressure prior to, during and after your session if indicated ° °About the pool  and parking: °1. Entering the pool °Your therapist will assist you; there are multiple ways to enter including stairs with railings, a walk in ramp, a roll in chair and a mechanical lift. Your therapist will determine the most appropriate way for  you. °2. Water temperature is usually between 86-87 degrees °3. There may be other swimmers in the pool at the same time °4. Parking is free: if you arrive and there is a parking attendant please inform them you are there for therapy and do not pay to park. Handicapped parking is located at the front entrance. ° °Contact Info:     Appointments: °[facility]  All sessions are 45 minutes   °[address]  [address]     Please call the [facility] if   °[place], [place]   [address]    you need to cancel or reschedule an appointment.  °[phone]     ° ° ° ° °

## 2020-04-15 ENCOUNTER — Encounter: Payer: Self-pay | Admitting: Physical Therapy

## 2020-04-15 NOTE — Therapy (Signed)
[facility] [address] [place], [place], [address] Phone: [phone]   Fax:  [phone]  Physical Therapy Treatment  Patient Details  Name: Andrew Larsen MRN: [id number] Date of Birth: 04-14-1941 Referring Provider (PT): Star Age, MD    Encounter Date: 04/14/2020   PT End of Session - 04/15/20 1344    Visit Number 7    Number of Visits 17    Date for PT Re-Evaluation 72/53/66   cert for 90 days, 60 day POC   Authorization Type [employer] MC (needs 10th visit PN)    Progress Note Due on Visit 10    PT Start Time 1147    PT Stop Time 1235    PT Time Calculation (min) 48 min    Equipment Utilized During Treatment Gait belt    Activity Tolerance Patient tolerated treatment well    Behavior During Therapy [facility] for tasks assessed/performed           Past Medical History:  Diagnosis Date  . CKD (chronic kidney disease)    CKD stage III (01/2018)  . Daytime somnolence   . Diabetes mellitus without complication ([place])   . Fracture    right fibula/wears boot cast  . GERD (gastroesophageal reflux disease)   . Glaucoma   . Hypercholesteremia   . Hyperlipidemia   . Hypertension   . Mold exposure   . PAD (peripheral artery disease) ([place])   . Sleep apnea     Past Surgical History:  Procedure Laterality Date  . LOWER EXTREMITY ANGIOGRAPHY N/A 06/12/2016   Procedure: Lower Extremity Angiography;  Surgeon: Adrian Prows, MD;  Location: [facility];  Service: Cardiovascular;  Laterality: N/A;  . MUSCLE BIOPSY Right 05/06/2018   Procedure: right vastus lateralis muscle biopsy;  Surgeon: Earnie Larsson, MD;  Location: [facility];  Service: Neurosurgery;  Laterality: Right;  . ORIF ANKLE FRACTURE Right 10/02/2012   Procedure: OPEN REDUCTION INTERNAL FIXATION (ORIF) ANKLE FRACTURE;  Surgeon: Meredith Pel, MD;  [facility];  Service: Orthopedics;  Laterality: Right;  . PERIPHERAL VASCULAR ATHERECTOMY Left 06/12/2016    Procedure: Peripheral Vascular Atherectomy-Left Popliteal;  Surgeon: Adrian Prows, MD;  Location: [facility];  Service: Cardiovascular;  Laterality: Left;  . PERIPHERAL VASCULAR INTERVENTION Left 06/12/2016   Procedure: Peripheral Vascular Intervention- DCB Left Popliteal;  Surgeon: Adrian Prows, MD;  Location: [facility];  Service: Cardiovascular;  Laterality: Left;    There were no vitals filed for this visit.   Subjective Assessment - 04/15/20 1323    Subjective Pt reports no problems or changes since previous visit    Pertinent History Inclusion body myositis, HTN, HLD, DM, B ankle sx 2014 and R knee sx 2018, PVD, Glaucoma    Limitations Walking;Standing;House hold activities    How long can you stand comfortably? approx 5 mins    How long can you walk comfortably? approx 5 mins    Patient Stated Goals "To get stronger"    Currently in Pain? No/denies                             [facility] Adult PT Treatment/Exercise - 04/15/20 0001      Transfers   Transfers Sit to Stand    Sit to Stand 5: Supervision    Stand to Sit 5: Supervision    Comments from mat table - wtith bil. UE support  Ambulation/Gait   Ambulation/Gait Yes    Ambulation/Gait Assistance 5: Supervision    Ambulation/Gait Assistance Details Pt has Rt knee hyperextension in stance - hyperextends for stability to prevent buckling    Ambulation Distance (Feet) 100 Feet    Assistive device Rolling walker    Gait Pattern Step-through pattern;Decreased weight shift to right   Rt knee hyperextension   Ambulation Surface Level;Indoor      Self-Care   Self-Care Other Self-Care Comments    Other Self-Care Comments  Discussed need for orthosis for RLE with evaluating PT, Cameron Sprang - discussed fact that pt does not have potential to progress off of RW to lesser assistive device without use of orthosis to prevent Rt knee from buckling      Neuro Re-ed    Neuro Re-ed Details  Pt performed tall  kneeling on mat using Kay bench in front for bil. UE support; pt performed 10 small mini squats (sitting back toward heels) with CGA;  pt needed min assist to transfer into this position from standing      Knee/Hip Exercises: Machines for Strengthening   Cybex Leg Press Bil. LE's 60# 25 reps:  RLE only 40# 20 reps:  LLE only 40# 20 reps      Knee/Hip Exercises: Standing   Hip Abduction Stengthening;Right;Left;1 set;10 reps;Knee straight   3# weight   Hip Extension Stengthening;Right;Left;1 set;10 reps;Knee straight   3# weight         Hip flexion - bil. LE's - with knee extended 10 reps each leg 3# weight in standing; with each knee flexed 3# weight  10 reps each leg     Balance Exercises - 04/15/20 0001      Balance Exercises: Standing   Step Over Hurdles / Cones stepping over balance beam in // bars with bil. UE support 10 reps each leg    Marching Solid surface;Static;10 reps;Upper extremity assist 2   inside // bars            PT Education - 04/15/20 1334    Education Details discussed aquatic therapy with pt - pt agrees to start aquatic therapy on Monday, 04-18-20; also discussed need for brace for RLE for knee stability - pt agrees to consult with [employer] (to be scheduled)    Person(s) Educated Patient    Methods Explanation    Comprehension Verbalized understanding            PT Short Term Goals - 04/15/20 1408      PT SHORT TERM GOAL #1   Title Patient will verbalize/demo understanding of HEP in order to increase LE strength and balance deficits. (TARGET DATE: 04/13/20)    Time 4    Period Weeks    Status On-going    Target Date 04/13/20      PT SHORT TERM GOAL #2   Title Pt will improve 5TSS to >/=24 secs with BUE support in order to indicate improved functional strength and dec fall risk.    Time 4    Period Weeks    Status New      PT SHORT TERM GOAL #3   Title Pt will complete BERG balance test and improve score by 4 points from baseline in order to  ind dec fall risk.    Time 4    Period Weeks    Status New      PT SHORT TERM GOAL #4   Title Pt will improve gait speed to >/=2.10 ft/sec w/ LRAD in  order to indicate dec fall risk.    Time 4    Period Weeks    Status New      PT SHORT TERM GOAL #5   Title Will assess 6MWT and update STG/add LTG to reflect improved endurance.    Time 4    Period Weeks    Status New             PT Long Term Goals - 04/15/20 1409      PT LONG TERM GOAL #1   Title Pt will be ind with final HEP in order to ind improved functional mobility and dec fall risk.  (Target Date: 05/14/19)    Time 8    Period Weeks    Status New      PT LONG TERM GOAL #2   Title Patient will have a gait speed of > or = 2.16ft/s indicating he is able to safely ambulate in the community.    Time 8    Period Weeks    Status New      PT LONG TERM GOAL #3   Title Pt will improve BERG balance score by 8 points from baseline in order to indicate dec fall risk.    Time 8    Period Weeks    Status New      PT LONG TERM GOAL #4   Title Pt will perform 5TSS in </=20 secs with single UE support in order to ind improved strength and dec fall risk.    Time 8    Period Weeks    Status New      PT LONG TERM GOAL #5   Title Pt will ambulate x 300' over unlevel paved surfaces with LRAD at mod I level in order to indicate improved community mobility and endurance.    Time 8    Period Weeks    Status New                 Plan - 04/15/20 1345    Clinical Impression Statement Discussed need for orthosis for RLE to prevent Rt knee from buckling (due to quad weakness) with primary PT, Cameron Sprang - she agreed orthosis would be needed if pt was to possibly progress off of RW for assistance with ambulation.  Consult with [facility] to be scheduled.  Pt able to maintain tall kneeling with UE support on [employer], but fatigued quickly with performing small squats for hip extensor strengthening.    Personal Factors and  Comorbidities Age;Comorbidity 3+;Time since onset of injury/illness/exacerbation    Comorbidities see above    Examination-Activity Limitations Locomotion Level;Lift;Squat;Stairs;Stand;Transfers    Examination-Participation Restrictions Community Activity;Cleaning;Laundry;Meal Prep    Stability/Clinical Decision Making Evolving/Moderate complexity    Rehab Potential Good    PT Frequency 2x / week   he may only do 1x due to copay   PT Duration 8 weeks    PT Treatment/Interventions ADLs/Self Care Home Management;Aquatic Therapy;Electrical Stimulation;DME Instruction;Gait training;Stair training;Functional mobility training;Therapeutic activities;Therapeutic exercise;Balance training;Neuromuscular re-education;Cognitive remediation;Orthotic Fit/Training;Patient/family education;Passive range of motion;Energy conservation;Vestibular    PT Next Visit Plan check STG's: sit<>stand, balance, gait training    Consulted and Agree with Plan of Care Patient           Patient will benefit from skilled therapeutic intervention in order to improve the following deficits and impairments:  Abnormal gait,Cardiopulmonary status limiting activity,Decreased activity tolerance,Decreased balance,Decreased cognition,Decreased endurance,Decreased knowledge of use of DME,Decreased mobility,Decreased strength,Difficulty walking,Impaired flexibility,Impaired perceived functional ability,Impaired sensation,Postural dysfunction  Visit  Diagnosis: Unsteadiness on feet  Muscle weakness (generalized)  Other abnormalities of gait and mobility     Problem List Patient Active Problem List   Diagnosis Date Noted  . Cough 01/07/2019  . Labile blood glucose   . SOB (shortness of breath)   . COVID-19   . Urinary frequency   . Hypoalbuminemia due to protein-calorie malnutrition ([place])   . Supplemental oxygen dependent   . Controlled type 2 diabetes mellitus with hyperglycemia ([place])   . CKD (chronic kidney disease),  stage III ([place])   . Benign essential HTN   . Physical debility 12/01/2018  . Pressure injury of skin 11/27/2018  . Acute respiratory failure with hypoxia ([place])   . 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 Acute respiratory disease due to COVID-19 virus 11/05/2018  . COVID-19 virus infection 11/03/2018  . CKD (chronic kidney disease) stage 3, GFR 30-59 ml/min (HCC) 11/03/2018  . Type 2 diabetes mellitus ([place]) 11/03/2018  . Essential hypertension 11/03/2018  . Claudication in peripheral vascular disease ([place]) 06/10/2016  . Morbid obesity ([place]) 09/21/2015  . Upper airway cough syndrome 09/20/2015  . Cough variant asthma 09/20/2015    DildayJenness Corner, PT 04/15/2020, 2:10 PM  [facility] [address] [place], [place], [address] Phone: [phone]   Fax:  [phone]  Name: Andrew Larsen MRN: [id number] Date of Birth: Aug 30, 1940

## 2020-04-18 ENCOUNTER — Other Ambulatory Visit: Payer: Self-pay

## 2020-04-18 ENCOUNTER — Ambulatory Visit: Payer: Medicare Other | Admitting: Physical Therapy

## 2020-04-18 DIAGNOSIS — R2681 Unsteadiness on feet: Secondary | ICD-10-CM

## 2020-04-18 DIAGNOSIS — M6281 Muscle weakness (generalized): Secondary | ICD-10-CM

## 2020-04-18 DIAGNOSIS — R2689 Other abnormalities of gait and mobility: Secondary | ICD-10-CM

## 2020-04-19 ENCOUNTER — Ambulatory Visit: Payer: Medicare Other | Admitting: Physical Therapy

## 2020-04-19 ENCOUNTER — Encounter: Payer: Self-pay | Admitting: Physical Therapy

## 2020-04-19 NOTE — Therapy (Signed)
[facility] [address] [place], [place], [address] Phone: [phone]   Fax:  [phone]  Physical Therapy Treatment  Patient Details  Name: Andrew Larsen MRN: [id number] Date of Birth: Feb 01, 1941 Referring Provider (PT): Star Age, MD    Encounter Date: 04/18/2020   PT End of Session - 04/19/20 1917    Visit Number 8    Number of Visits 17    Date for PT Re-Evaluation 33/35/45   cert for 90 days, 60 day POC   Authorization Type [employer] [facility] (needs 10th visit PN)    Progress Note Due on Visit 10    PT Start Time 1415    PT Stop Time 1500    PT Time Calculation (min) 45 min    Equipment Utilized During Treatment Other (comment)   flotation belt and bar bells, pool noodle   Activity Tolerance Patient tolerated treatment well    Behavior During Therapy [facility] for tasks assessed/performed           Past Medical History:  Diagnosis Date  . CKD (chronic kidney disease)    CKD stage III (01/2018)  . Daytime somnolence   . Diabetes mellitus without complication ([place])   . Fracture    right fibula/wears boot cast  . GERD (gastroesophageal reflux disease)   . Glaucoma   . Hypercholesteremia   . Hyperlipidemia   . Hypertension   . Mold exposure   . PAD (peripheral artery disease) ([place])   . Sleep apnea     Past Surgical History:  Procedure Laterality Date  . LOWER EXTREMITY ANGIOGRAPHY N/A 06/12/2016   Procedure: Lower Extremity Angiography;  Surgeon: Adrian Prows, MD;  Location: [place] [facility];  Service: Cardiovascular;  Laterality: N/A;  . MUSCLE BIOPSY Right 05/06/2018   Procedure: right vastus lateralis muscle biopsy;  Surgeon: Earnie Larsson, MD;  Location: [place];  Service: Neurosurgery;  Laterality: Right;  . ORIF ANKLE FRACTURE Right 10/02/2012   Procedure: OPEN REDUCTION INTERNAL FIXATION (ORIF) ANKLE FRACTURE;  Surgeon: Meredith Pel, MD;  [facility];  Service: Orthopedics;  Laterality: Right;  .  PERIPHERAL VASCULAR ATHERECTOMY Left 06/12/2016   Procedure: Peripheral Vascular Atherectomy-Left Popliteal;  Surgeon: Adrian Prows, MD;  Location: [place] [facility];  Service: Cardiovascular;  Laterality: Left;  . PERIPHERAL VASCULAR INTERVENTION Left 06/12/2016   Procedure: Peripheral Vascular Intervention- DCB Left Popliteal;  Surgeon: Adrian Prows, MD;  Location: [place] [facility];  Service: Cardiovascular;  Laterality: Left;    There were no vitals filed for this visit.   Subjective Assessment - 04/19/20 1915    Subjective Pt presents for first aquatic therapy session at [facility]    Pertinent History Inclusion body myositis, HTN, HLD, DM, B ankle sx 2014 and R knee sx 2018, PVD, Glaucoma    Limitations Walking;Standing;House hold activities    How long can you stand comfortably? approx 5 mins    How long can you walk comfortably? approx 5 mins    Patient Stated Goals "To get stronger"    Currently in Pain? No/denies           Aquatic therapy at [facility] - pool temp 87.4 degrees  Patient seen for aquatic therapy today.  Treatment took place in water 3.5-4 feet deep depending upon activity.  Pt entered and exited the pool via ramp negotiation.  Pt gait trained approx. 50' from ramp to end of pool using RUE support on rail in the pool; pt c/o Rt hip  discomfort after amb. This distance due to increased weight bearing on RLE without LUE support - short rest period given  Pt gait trained in 4' water depth with bil. UE support on noodle which was stabilized by PT - 20m x 2 reps across pool  Pt performed bil. Hip strengthening exercises in standing - hip flexion, abdct., and extension 10 reps each direction each leg; pt performed bil. Hip flexion/extension with knee flexed at 90 degrees 10 reps each leg  Bil. Hip and knee extension in closed chain for strengthening - pushing noodle down toward pool floor 10 reps each leg (min assist by PT to maintain position of foot on noodle)  Squats x 10 reps (small  range) due to hip weakness with bil. UE support on pool edge  Marching in place 10 reps with min assist   Pt performed bicycling in supine position with use of pool noodle and flotation belt 86m x 2 reps; hip abdct/ adduction in supine (supported by PT)  Pt requires buoyancy of the water for support for safety and for off loading; pt is high risk for fall without UE support and the buoyancy of the water provides support with reduced fall risk with balance activities and with gait training with decr. UE support (this activity is unable to be attempted on land due to high risk of fall);  Aquatic environment provides safety that can not be achieved with land based PT The viscosity of the water is needed for resistance for strengthening                             PT Short Term Goals - 04/19/20 1923      PT SHORT TERM GOAL #1   Title Patient will verbalize/demo understanding of HEP in order to increase LE strength and balance deficits. (TARGET DATE: 04/13/20)    Time 4    Period Weeks    Status On-going    Target Date 04/13/20      PT SHORT TERM GOAL #2   Title Pt will improve 5TSS to >/=24 secs with BUE support in order to indicate improved functional strength and dec fall risk.    Time 4    Period Weeks    Status New      PT SHORT TERM GOAL #3   Title Pt will complete BERG balance test and improve score by 4 points from baseline in order to ind dec fall risk.    Time 4    Period Weeks    Status New      PT SHORT TERM GOAL #4   Title Pt will improve gait speed to >/=2.10 ft/sec w/ LRAD in order to indicate dec fall risk.    Time 4    Period Weeks    Status New      PT SHORT TERM GOAL #5   Title Will assess 6MWT and update STG/add LTG to reflect improved endurance.    Time 4    Period Weeks    Status New             PT Long Term Goals - 04/19/20 1922      PT LONG TERM GOAL #1   Title Pt will be ind with final HEP in order to ind improved  functional mobility and dec fall risk.  (Target Date: 05/14/19)    Time 8    Period Weeks    Status New  PT LONG TERM GOAL #2   Title Patient will have a gait speed of > or = 2.47ft/s indicating he is able to safely ambulate in the community.    Time 8    Period Weeks    Status New      PT LONG TERM GOAL #3   Title Pt will improve BERG balance score by 8 points from baseline in order to indicate dec fall risk.    Time 8    Period Weeks    Status New      PT LONG TERM GOAL #4   Title Pt will perform 5TSS in </=20 secs with single UE support in order to ind improved strength and dec fall risk.    Time 8    Period Weeks    Status New      PT LONG TERM GOAL #5   Title Pt will ambulate x 300' over unlevel paved surfaces with LRAD at mod I level in order to indicate improved community mobility and endurance.    Time 8    Period Weeks    Status New                 Plan - 04/19/20 1918    Clinical Impression Statement Pt tolerated first aquatic therapy session well; pt somewhat nervous at beginning of session but gained confidence as balance improved.  Min assist to [facility] provided for safety and for occasional instability of Rt knee.  Pt reported fatigue at end of session.  Cont with POC.    Personal Factors and Comorbidities Age;Comorbidity 3+;Time since onset of injury/illness/exacerbation    Comorbidities see above    Examination-Activity Limitations Locomotion Level;Lift;Squat;Stairs;Stand;Transfers    Examination-Participation Restrictions Community Activity;Cleaning;Laundry;Meal Prep    Stability/Clinical Decision Making Evolving/Moderate complexity    Rehab Potential Good    PT Frequency 2x / week   he may only do 1x due to copay   PT Duration 8 weeks    PT Treatment/Interventions ADLs/Self Care Home Management;Aquatic Therapy;Electrical Stimulation;DME Instruction;Gait training;Stair training;Functional mobility training;Therapeutic activities;Therapeutic  exercise;Balance training;Neuromuscular re-education;Cognitive remediation;Orthotic Fit/Training;Patient/family education;Passive range of motion;Energy conservation;Vestibular    PT Next Visit Plan check STG's: sit<>stand, balance, gait training    Consulted and Agree with Plan of Care Patient           Patient will benefit from skilled therapeutic intervention in order to improve the following deficits and impairments:  Abnormal gait,Cardiopulmonary status limiting activity,Decreased activity tolerance,Decreased balance,Decreased cognition,Decreased endurance,Decreased knowledge of use of DME,Decreased mobility,Decreased strength,Difficulty walking,Impaired flexibility,Impaired perceived functional ability,Impaired sensation,Postural dysfunction  Visit Diagnosis: Unsteadiness on feet  Other abnormalities of gait and mobility  Muscle weakness (generalized)     Problem List Patient Active Problem List   Diagnosis Date Noted  . Cough 01/07/2019  . Labile blood glucose   . SOB (shortness of breath)   . COVID-19   . Urinary frequency   . Hypoalbuminemia due to protein-calorie malnutrition ([place])   . Supplemental oxygen dependent   . Controlled type 2 diabetes mellitus with hyperglycemia ([place])   . CKD (chronic kidney disease), stage III ([place])   . Benign essential HTN   . Physical debility 12/01/2018  . Pressure injury of skin 11/27/2018  . Acute respiratory failure with hypoxia ([place])   . 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 Acute respiratory disease due to COVID-19 virus 11/05/2018  . COVID-19 virus infection 11/03/2018  . CKD (chronic kidney disease) stage 3, GFR 30-59 ml/min (H[facility]) 11/03/2018  . Type 2 diabetes  mellitus ([place]) 11/03/2018  . Essential hypertension 11/03/2018  . Claudication in peripheral vascular disease ([place]) 06/10/2016  . Morbid obesity ([place]) 09/21/2015  . Upper airway cough syndrome 09/20/2015  . Cough variant asthma 09/20/2015    Kyndle Schlender, [place],  PT, [place] 04/19/2020, 7:25 PM  [facility] [address] [place], [place], [address] Phone: [phone]   Fax:  [phone]  Name: Andrew Larsen MRN: [id number] Date of Birth: 1941/01/21

## 2020-04-26 ENCOUNTER — Other Ambulatory Visit: Payer: Self-pay

## 2020-04-26 ENCOUNTER — Ambulatory Visit: Payer: Medicare Other | Admitting: Physical Therapy

## 2020-04-26 DIAGNOSIS — M6281 Muscle weakness (generalized): Secondary | ICD-10-CM

## 2020-04-26 DIAGNOSIS — R2681 Unsteadiness on feet: Secondary | ICD-10-CM | POA: Diagnosis not present

## 2020-04-26 DIAGNOSIS — R2689 Other abnormalities of gait and mobility: Secondary | ICD-10-CM

## 2020-04-27 ENCOUNTER — Encounter: Payer: Self-pay | Admitting: Physical Therapy

## 2020-04-27 NOTE — Therapy (Signed)
[facility] [address] [place], [place], [address] Phone: [phone]   Fax:  [phone]  Physical Therapy Treatment  Patient Details  Name: Andrew Larsen MRN: [id number] Date of Birth: [birth year] Referring Provider (PT): Star Age, MD    Encounter Date: 04/26/2020   PT End of Session - 04/27/20 1737    Visit Number 9    Number of Visits 17    Date for PT Re-Evaluation 29/92/42   cert for 90 days, 60 day POC   Authorization Type [employer] [facility] (needs 10th visit PN)    Progress Note Due on Visit 10    PT Start Time 1017    PT Stop Time 1100    PT Time Calculation (min) 43 min    Equipment Utilized During Treatment --    Activity Tolerance Patient tolerated treatment well    Behavior During Therapy WFL for tasks assessed/performed           Past Medical History:  Diagnosis Date   CKD (chronic kidney disease)    CKD stage III (01/2018)   Daytime somnolence    Diabetes mellitus without complication ([place])    Fracture    right fibula/wears boot cast   GERD (gastroesophageal reflux disease)    Glaucoma    Hypercholesteremia    Hyperlipidemia    Hypertension    Mold exposure    PAD (peripheral artery disease) ([place])    Sleep apnea     Past Surgical History:  Procedure Laterality Date   LOWER EXTREMITY ANGIOGRAPHY N/A 06/12/2016   Procedure: Lower Extremity Angiography;  Surgeon: Adrian Prows, MD;  Location: [facility];  Service: Cardiovascular;  Laterality: N/A;   MUSCLE BIOPSY Right 05/06/2018   Procedure: right vastus lateralis muscle biopsy;  Surgeon: Earnie Larsson, MD;  Location: [facility];  Service: Neurosurgery;  Laterality: Right;   ORIF ANKLE FRACTURE Right 10/02/2012   Procedure: OPEN REDUCTION INTERNAL FIXATION (ORIF) ANKLE FRACTURE;  Surgeon: Meredith Pel, MD;  [facility];  Service: Orthopedics;  Laterality: Right;   PERIPHERAL VASCULAR ATHERECTOMY Left 06/12/2016   Procedure:  Peripheral Vascular Atherectomy-Left Popliteal;  Surgeon: Adrian Prows, MD;  Location: [facility] [facility];  Service: Cardiovascular;  Laterality: Left;   PERIPHERAL VASCULAR INTERVENTION Left 06/12/2016   Procedure: Peripheral Vascular Intervention- DCB Left Popliteal;  Surgeon: Adrian Prows, MD;  Location: [place] [facility];  Service: Cardiovascular;  Laterality: Left;    There were no vitals filed for this visit.   Subjective Assessment - 04/26/20 1025    Subjective Pt states the pool area was cold last week - says he wants to hold pool exercise for now;  pt reports legs are very tired today    Pertinent History Inclusion body myositis, HTN, HLD, DM, B ankle sx 2014 and R knee sx 2018, PVD, Glaucoma    Limitations Walking;Standing;House hold activities    How long can you stand comfortably? approx 5 mins    How long can you walk comfortably? approx 5 mins    Patient Stated Goals "To get stronger"    Currently in Pain? No/denies              [facility] PT Assessment - 04/27/20 0001      Berg Balance Test   Sit to Stand Able to stand  independently using hands    Standing Unsupported Able to stand 2 minutes with supervision    Sitting with Back Unsupported but Feet Supported on Floor  or Stool Able to sit safely and securely 2 minutes    Stand to Sit Controls descent by using hands    Transfers Able to transfer safely, definite need of hands    Standing Unsupported with Eyes Closed Able to stand 10 seconds with supervision    Standing Unsupported with Feet Together Needs help to attain position but able to stand for 30 seconds with feet together    From Standing, Reach Forward with Outstretched Arm Reaches forward but needs supervision    From Standing Position, Pick up Object from Floor Able to pick up shoe, needs supervision    From Standing Position, Turn to Look Behind Over each Shoulder Needs supervision when turning    Turn 360 Degrees Needs close supervision or verbal cueing     Standing Unsupported, Alternately Place Feet on Step/Stool Able to complete >2 steps/needs minimal assist   min HHA   Standing Unsupported, One Foot in Front Able to take small step independently and hold 30 seconds    Standing on One Leg Tries to lift leg/unable to hold 3 seconds but remains standing independently    Total Score 30    Berg comment: 30/56.  (1 point improved) <36 high risk for falls (close to 100%)           5 times sit to stand score 20.19 secs - with bil. UE support from standard chair              [facility] Adult PT Treatment/Exercise - 04/27/20 0001      Ambulation/Gait   Ambulation/Gait Yes    Ambulation/Gait Assistance 5: Supervision    Ambulation Distance (Feet) 200 Feet   in 3" walk test - pt needed seated rest period after amb. 2" 18 secs   Assistive device Rolling walker    Gait Pattern Step-through pattern;Decreased weight shift to right   Rt knee hyperextension   Ambulation Surface Level;Indoor    Gait velocity 20.19 = 1.62 ft/sec with RW      Knee/Hip Exercises: Machines for Strengthening   Cybex Leg Press Bil. LE's 60# 3 sets 10 reps      Knee/Hip Exercises: Standing   Hip Flexion Stengthening;Right;Left;10 reps;Knee straight;Knee bent;2 sets   2# weight   Hip Abduction Stengthening;Right;Left;1 set;10 reps;Knee straight   2# weight   Hip Extension Stengthening;Right;Left;1 set;10 reps;Knee straight   2# weight                   PT Short Term Goals - 04/26/20 1026      PT SHORT TERM GOAL #1   Title Patient will verbalize/demo understanding of HEP in order to increase LE strength and balance deficits. (TARGET DATE: 04/13/20)    Baseline pt reports he is doing some exercises at home but not on a regular basis - 04-26-20    Time 4    Period Weeks    Status On-going    Target Date 04/13/20      PT SHORT TERM GOAL #2   Title Pt will improve 5TSS to >/=24 secs with BUE support in order to indicate improved functional strength and  dec fall risk.    Baseline 27.5 secs with bil. UE support    Time 4    Period Weeks    Status Not Met      PT SHORT TERM GOAL #3   Title Pt will complete BERG balance test and improve score by 4 points from baseline in order to ind dec  fall risk.    Baseline not met - initial score 29/56; score 30/56 on 04-26-20    Time 4    Period Weeks    Status Not Met      PT SHORT TERM GOAL #4   Title Pt will improve gait speed to >/=2.10 ft/sec w/ LRAD in order to indicate dec fall risk.    Baseline 1.62 ft/sec with RW - 04-26-20    Time 4    Period Weeks    Status Not Met      PT SHORT TERM GOAL #5   Title Will assess 6MWT and update STG/add LTG to reflect improved endurance.    Baseline pt amb. 2" 18 secs before requiring seated rest break due to fatigue - 200' amb. distance    Time 4    Period Weeks    Status On-going             PT Long Term Goals - 04/27/20 1745      PT LONG TERM GOAL #1   Title Pt will be ind with final HEP in order to ind improved functional mobility and dec fall risk.  (Target Date: 05/14/19)    Time 8    Period Weeks    Status New      PT LONG TERM GOAL #2   Title Patient will have a gait speed of > or = 2.64ft/s indicating he is able to safely ambulate in the community.    Time 8    Period Weeks    Status New      PT LONG TERM GOAL #3   Title Pt will improve BERG balance score by 8 points from baseline in order to indicate dec fall risk.    Time 8    Period Weeks    Status New      PT LONG TERM GOAL #4   Title Pt will perform 5TSS in </=20 secs with single UE support in order to ind improved strength and dec fall risk.    Time 8    Period Weeks    Status New      PT LONG TERM GOAL #5   Title Pt will ambulate x 300' over unlevel paved surfaces with LRAD at mod I level in order to indicate improved community mobility and endurance.    Time 8    Period Weeks    Status New                 Plan - 04/27/20 1747    Clinical Impression  Statement Pt has not met any STG's; STG's #1 & 5 are ongoing as pt has HEP but is not performing exercises on a regular basis;  STG #5 is ongoing as pt is unable to amb./stand for  6" due to fatigue.  Pt amb. 2" 18 secs and needed seated rest break (amb. distance 200').  STG's #2-4 not met as 5x sit to stand score has not improved to < 24 secs, Berg score has increased 1 point (from 29/56 to 30/56) and gait velocity = 1.62 ft/sec, not 2.10 ft/sec per stated STG.  Cont with POC.    Personal Factors and Comorbidities Age;Comorbidity 3+;Time since onset of injury/illness/exacerbation    Comorbidities see above    Examination-Activity Limitations Locomotion Level;Lift;Squat;Stairs;Stand;Transfers    Examination-Participation Restrictions Community Activity;Cleaning;Laundry;Meal Prep    Stability/Clinical Decision Making Evolving/Moderate complexity    Rehab Potential Good    PT Frequency 2x / week   he may  only do 1x due to copay   PT Duration 8 weeks    PT Treatment/Interventions ADLs/Self Care Home Management;Aquatic Therapy;Electrical Stimulation;DME Instruction;Gait training;Stair training;Functional mobility training;Therapeutic activities;Therapeutic exercise;Balance training;Neuromuscular re-education;Cognitive remediation;Orthotic Fit/Training;Patient/family education;Passive range of motion;Energy conservation;Vestibular    PT Next Visit Plan 10th visit progress note due next session - sit<>stand, balance, gait training    Recommended Other Services orthotic consult with [facility] scheduled for 05-06-19    Consulted and Agree with Plan of Care Patient           Patient will benefit from skilled therapeutic intervention in order to improve the following deficits and impairments:  Abnormal gait,Cardiopulmonary status limiting activity,Decreased activity tolerance,Decreased balance,Decreased cognition,Decreased endurance,Decreased knowledge of use of DME,Decreased mobility,Decreased  strength,Difficulty walking,Impaired flexibility,Impaired perceived functional ability,Impaired sensation,Postural dysfunction  Visit Diagnosis: Unsteadiness on feet  Other abnormalities of gait and mobility  Muscle weakness (generalized)     Problem List Patient Active Problem List   Diagnosis Date Noted   Cough 01/07/2019   Labile blood glucose    SOB (shortness of breath)    COVID-19    Urinary frequency    Hypoalbuminemia due to protein-calorie malnutrition ([place])    Supplemental oxygen dependent    Controlled type 2 diabetes mellitus with hyperglycemia ([facility])    CKD (chronic kidney disease), stage III ([facility])    Benign essential HTN    Physical debility 12/01/2018   Pressure injury of skin 11/27/2018   Acute respiratory failure with hypoxia ([facility])    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   Acute respiratory disease due to COVID-19 virus 11/05/2018   COVID-19 virus infection 11/03/2018   CKD (chronic kidney disease) stage 3, GFR 30-59 ml/min ([place]) 11/03/2018   Type 2 diabetes mellitus ([place]) 11/03/2018   Essential hypertension 11/03/2018   Claudication in peripheral vascular disease ([place]) 06/10/2016   Morbid obesity ([place]) 09/21/2015   Upper airway cough syndrome 09/20/2015   Cough variant asthma 09/20/2015    Alda Lea, PT 04/27/2020, 5:55 PM  [facility] [address] [place], [place], [address] Phone: [phone]   Fax:  [phone]  Name: Andrew Larsen MRN: [id number] Date of Birth: Sep 10, 1940

## 2020-04-28 ENCOUNTER — Ambulatory Visit: Payer: Medicare Other | Admitting: Rehabilitation

## 2020-05-03 ENCOUNTER — Ambulatory Visit: Payer: Medicare Other | Admitting: Physical Therapy

## 2020-05-05 ENCOUNTER — Ambulatory Visit: Payer: Medicare Other | Admitting: Physical Therapy

## 2020-05-10 ENCOUNTER — Other Ambulatory Visit: Payer: Self-pay | Admitting: Cardiology

## 2020-05-10 DIAGNOSIS — E7800 Pure hypercholesterolemia, unspecified: Secondary | ICD-10-CM

## 2020-05-10 DIAGNOSIS — H401131 Primary open-angle glaucoma, bilateral, mild stage: Secondary | ICD-10-CM | POA: Diagnosis not present

## 2020-06-01 DIAGNOSIS — G4733 Obstructive sleep apnea (adult) (pediatric): Secondary | ICD-10-CM | POA: Diagnosis not present

## 2020-06-29 DIAGNOSIS — M25572 Pain in left ankle and joints of left foot: Secondary | ICD-10-CM | POA: Diagnosis not present

## 2020-06-29 DIAGNOSIS — M25562 Pain in left knee: Secondary | ICD-10-CM | POA: Diagnosis not present

## 2020-08-04 DIAGNOSIS — R5381 Other malaise: Secondary | ICD-10-CM | POA: Diagnosis not present

## 2020-08-04 DIAGNOSIS — E1142 Type 2 diabetes mellitus with diabetic polyneuropathy: Secondary | ICD-10-CM | POA: Diagnosis not present

## 2020-08-04 DIAGNOSIS — I739 Peripheral vascular disease, unspecified: Secondary | ICD-10-CM | POA: Diagnosis not present

## 2020-08-08 ENCOUNTER — Other Ambulatory Visit: Payer: Self-pay | Admitting: Cardiology

## 2020-08-08 DIAGNOSIS — E7800 Pure hypercholesterolemia, unspecified: Secondary | ICD-10-CM

## 2020-08-15 ENCOUNTER — Encounter: Payer: Self-pay | Admitting: Adult Health

## 2020-08-15 ENCOUNTER — Ambulatory Visit: Payer: PPO | Admitting: Adult Health

## 2020-08-15 VITALS — BP 135/69 | HR 70 | Ht 71.0 in | Wt 274.0 lb

## 2020-08-15 DIAGNOSIS — Z9989 Dependence on other enabling machines and devices: Secondary | ICD-10-CM | POA: Diagnosis not present

## 2020-08-15 DIAGNOSIS — G4733 Obstructive sleep apnea (adult) (pediatric): Secondary | ICD-10-CM

## 2020-08-15 NOTE — Progress Notes (Addendum)
PATIENT: Andrew Larsen DOB: 1941/02/19  REASON FOR VISIT: follow up HISTORY FROM: patient  HISTORY OF PRESENT ILLNESS: Today 08/15/20:  Andrew Larsen is a 80 year old male with a history of obstructive sleep apnea on CPAP.  His download indicates that he uses machine 24 out of 30 days for compliance of 80%.  He uses machine greater than 4 hours for compliance of 60%.  On average he uses his machine 4 hours and 41 minutes.  His residual AHI is 6.9 on 11 cm of water.  Reports that some night he has to take the machine off because he feels as if he is not getting enough pressure.  He returns today for an evaluation.  HISTORY 02/15/20: He reports more weakness in his legs.  He fell a few weeks ago as he lost his balance but he also was not using his walker.  He has been using a 2 wheeled walker consistently.  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  Unfortunately, he contracted COVID-19 and was hospitalized from November 03 2018 through August third 2020 and subsequent inpatient rehab from August third 2020 through August 24th 2020.  He is followed by cardiology and pulmonology.  He has not seen his [facility] neurologist in several months.  He would like to get some outpatient physical therapy.  He has not used his CPAP machine after he found out that his machine was on recall.  He has registered his machine on the website but has not talked to his DME company about a replacement yet.  He has used an ozone based cleaning machine on his CPAP machine and is advised to no longer use it for now until we have a better sense for when he gets a replacement machine.  He does admit that he does not sleep as well without his machine.   REVIEW OF SYSTEMS: Out of a complete 14 system review of symptoms, the patient complains only of the following symptoms, and all other reviewed systems are negative.  FSS  40 ESS8  ALLERGIES: Allergies  Allergen Reactions  . Metformin And Related    . Simbrinza [Brinzolamide-Brimonidine]     Drainage, redness to eyes    HOME MEDICATIONS: Outpatient Medications Prior to Visit  Medication Sig Dispense Refill  . albuterol (VENTOLIN HFA) 108 (90 Base) MCG/ACT inhaler Inhale 2 puffs into the lungs every 4 (four) hours as needed for wheezing or shortness of breath. 18 g 5  . aspirin 81 MG chewable tablet Chew 81 mg by mouth at bedtime.     . calcium carbonate (CALCIUM 600) 600 MG TABS tablet Take 2 tablets (1,200 mg total) by mouth daily. 60 tablet 0  . Cholecalciferol (VITAMIN D3) 5000 units TABS Take 10,000 Units by mouth daily.     . cilostazol (PLETAL) 100 MG tablet TAKE 1 TABLET(100 MG) BY MOUTH TWICE DAILY 60 tablet 2  . ezetimibe (ZETIA) 10 MG tablet TAKE 1 TABLET(10 MG) BY MOUTH DAILY AFTER SUPPER 30 tablet 2  . famotidine (PEPCID) 20 MG tablet Take 1 tablet (20 mg total) by mouth at bedtime. 30 tablet 0  . folic acid (FOLVITE) 1 MG tablet Take 1 tablet (1 mg total) by mouth daily. 30 tablet 0  . furosemide (LASIX) 40 MG tablet Take 1 tablet (40 mg total) by mouth daily. 30 tablet 0  . gabapentin (NEURONTIN) 300 MG capsule Take 2 capsules (600 mg total) by mouth 3 (three) times daily. 90 capsule 0  .  insulin aspart protamine - aspart (NOVOLOG MIX 70/30 FLEXPEN) (70-30) 100 UNIT/ML FlexPen Inject 0.36 mLs (36 Units total) into the skin daily with breakfast. (Patient taking differently: Inject into the skin daily with breakfast. 60 units in AM and 50 units at night) 15 mL 11  . latanoprost (XALATAN) 0.005 % ophthalmic solution Place 1 drop into both eyes at bedtime.    Marland Kitchen losartan (COZAAR) 25 MG tablet TAKE 1 TABLET(25 MG) BY MOUTH DAILY 30 tablet 2  . magnesium gluconate (MAGONATE) 500 MG tablet Take 500 mg by mouth daily.    . metoprolol tartrate (LOPRESSOR) 25 MG tablet Take 1 tablet (25 mg total) by mouth 2 (two) times daily. 60 tablet 0  . Multiple Vitamin (MULTIVITAMIN) capsule Take 1 capsule by mouth daily.     Marland Kitchen oxybutynin  (DITROPAN) 5 MG tablet Take 1 tablet (5 mg total) by mouth 2 (two) times daily. 60 tablet 0  . potassium chloride SA (K-DUR) 20 MEQ tablet Take 1 tablet (20 mEq total) by mouth daily. 30 tablet 0  . rosuvastatin (CRESTOR) 10 MG tablet Take 1 tablet (10 mg total) by mouth once a week. 90 tablet 3  . tamsulosin (FLOMAX) 0.4 MG CAPS capsule Take 0.4 mg by mouth daily.    . timolol (TIMOPTIC) 0.5 % ophthalmic solution INT 1 GTT IN EACH EYE BID    . vitamin C (VITAMIN C) 500 MG tablet Take 1 tablet (500 mg total) by mouth daily. 30 tablet 0  . zinc sulfate 220 (50 Zn) MG capsule Take 1 capsule (220 mg total) by mouth daily. 30 capsule 0  . ALPRAZolam (XANAX) 0.5 MG tablet TK 1 T PO HS    . ezetimibe (ZETIA) 10 MG tablet TAKE 1 TABLET(10 MG) BY MOUTH DAILY AFTER SUPPER 30 tablet 2  . ezetimibe (ZETIA) 10 MG tablet TAKE 1 TABLET(10 MG) BY MOUTH DAILY AFTER SUPPER 30 tablet 2   No facility-administered medications prior to visit.    PAST MEDICAL HISTORY: Past Medical History:  Diagnosis Date  . CKD (chronic kidney disease)    CKD stage III (01/2018)  . Daytime somnolence   . Diabetes mellitus without complication ([place])   . Fracture    right fibula/wears boot cast  . GERD (gastroesophageal reflux disease)   . Glaucoma   . Hypercholesteremia   . Hyperlipidemia   . Hypertension   . Mold exposure   . PAD (peripheral artery disease) ([place])   . Sleep apnea     PAST SURGICAL HISTORY: Past Surgical History:  Procedure Laterality Date  . LOWER EXTREMITY ANGIOGRAPHY N/A 06/12/2016   Procedure: Lower Extremity Angiography;  Surgeon: Adrian Prows, MD;  Location: [facility];  Service: Cardiovascular;  Laterality: N/A;  . MUSCLE BIOPSY Right 05/06/2018   Procedure: right vastus lateralis muscle biopsy;  Surgeon: Earnie Larsson, MD;  Location: [facility];  Service: Neurosurgery;  Laterality: Right;  . ORIF ANKLE FRACTURE Right 10/02/2012   Procedure: OPEN REDUCTION INTERNAL FIXATION (ORIF) ANKLE FRACTURE;   Surgeon: Meredith Pel, MD;  Location: [facility];  Service: Orthopedics;  Laterality: Right;  . PERIPHERAL VASCULAR ATHERECTOMY Left 06/12/2016   Procedure: Peripheral Vascular Atherectomy-Left Popliteal;  Surgeon: Adrian Prows, MD;  Location: [facility];  Service: Cardiovascular;  Laterality: Left;  . PERIPHERAL VASCULAR INTERVENTION Left 06/12/2016   Procedure: Peripheral Vascular Intervention- DCB Left Popliteal;  Surgeon: Adrian Prows, MD;  Location: [facility];  Service: Cardiovascular;  Laterality: Left;    FAMILY HISTORY:  Family History  Problem Relation Age of Onset  . Heart disease Father   . Prostate cancer Brother   . Cancer Brother   . Diabetes Brother   . Colon cancer Neg Hx     SOCIAL HISTORY: Social History   Socioeconomic History  . Marital status: Widowed    Spouse name: Not on file  . Number of children: 1  . Years of education: Not on file  . Highest education level: Not on file  Occupational History  . Not on file  Tobacco Use  . Smoking status: Former Smoker    Packs/day: 1.00    Years: 20.00    Pack years: 20.00    Types: Cigarettes    Quit date: 08/22/1981    Years since quitting: 39.0  . Smokeless tobacco: Never Used  Vaping Use  . Vaping Use: Never used  Substance and Sexual Activity  . Alcohol use: No    Alcohol/week: 0.0 standard drinks  . Drug use: No  . Sexual activity: Never  Other Topics Concern  . Not on file  Social History Narrative   ** Merged History Encounter **       Social Determinants of Health   Financial Resource Strain: Not on file  Food Insecurity: Not on file  Transportation Needs: Not on file  Physical Activity: Not on file  Stress: Not on file  Social Connections: Not on file  Intimate Partner Violence: Not on file      PHYSICAL EXAM  Vitals:   08/15/20 1320  BP: 135/69  Pulse: 70  Weight: 274 lb (124.3 kg)  Height: 5\' 11"  (1.803 m)   Body mass index is 38.22 kg/m.  Generalized: Well  developed, in no acute distress  Chest: Lungs clear to auscultation bilaterally  Neurological examination  Mentation: Alert oriented to time, place, history taking. Follows all commands speech and language fluent Cranial nerve II-XII: Extraocular movements were full, visual field were full on confrontational test Head turning and shoulder shrug  were normal and symmetric. Motor: The motor testing reveals 5 over 5 strength of all 4 extremities. Good symmetric motor tone is noted throughout.  Sensory: Sensory testing is intact to soft touch on all 4 extremities. No evidence of extinction is noted.  Gait and station: Gait is normal.    DIAGNOSTIC DATA (LABS, IMAGING, TESTING) - I reviewed patient records, labs, notes, testing and imaging myself where available.  Lab Results  Component Value Date   WBC 4.9 12/15/2018   HGB 13.8 12/15/2018   HCT 41.1 12/15/2018   MCV 94.1 12/15/2018   PLT 226 12/15/2018      Component Value Date/Time   NA 142 02/15/2020 0921   K 4.7 02/15/2020 0921   CL 101 02/15/2020 0921   CO2 27 02/15/2020 0921   GLUCOSE 113 (H) 02/15/2020 0921   GLUCOSE 247 (H) 12/15/2018 0816   BUN 21 02/15/2020 0921   CREATININE 1.55 (H) 02/15/2020 0921   CALCIUM 9.9 02/15/2020 0921   PROT 6.1 07/13/2019 0833   ALBUMIN 3.9 07/13/2019 0833   AST 26 07/13/2019 0833   ALT 21 07/13/2019 0833   ALKPHOS 112 07/13/2019 0833   BILITOT 0.3 07/13/2019 0833   GFRNONAA 43 (L) 02/15/2020 0921   GFRAA 49 (L) 02/15/2020 0921   Lab Results  Component Value Date   CHOL 157 02/15/2020   HDL 49 02/15/2020   LDLCALC 95 02/15/2020   TRIG 67 02/15/2020   CHOLHDL 2.7 11/08/2018   Lab Results  Component Value Date   HGBA1C 7.3 (H) 11/08/2018   No results found for: VITAMINB12 Lab Results  Component Value Date   TSH 3.910 07/13/2019      ASSESSMENT AND PLAN 80 y.o. year old male  has a past medical history of CKD (chronic kidney disease), Daytime somnolence, Diabetes mellitus  without complication ([place]), Fracture, GERD (gastroesophageal reflux disease), Glaucoma, Hypercholesteremia, Hyperlipidemia, Hypertension, Mold exposure, PAD (peripheral artery disease) ([place]), and Sleep apnea. here with:  1. OSA on CPAP  - CPAP compliance excellent -Residual AHI slightly elevated -Increase pressure to 12 cm water - Encourage patient to use CPAP nightly and > 4 hours each night - F/U in  6 months or sooner if needed    Ward Givens, MSN, NP-C 08/15/2020, 1:29 PM [facility] [address], [place], [place] [address] [phone]  I reviewed the above note and documentation by the Nurse Practitioner and agree with the history, exam, assessment and plan as outlined above. I was available for consultation. Star Age, MD, PhD [facility])

## 2020-08-15 NOTE — Patient Instructions (Signed)
Your Plan:  Continue using CPAP nightly Increase pressure 12     Thank you for coming to see [place] at [facility]. I hope we have been able to provide you high quality care today.  You may receive a patient satisfaction survey over the next few weeks. We would appreciate your feedback and comments so that we may continue to improve ourselves and the health of our patients.

## 2020-08-18 NOTE — Progress Notes (Signed)
Cm sent [facility]

## 2020-08-19 DIAGNOSIS — Z961 Presence of intraocular lens: Secondary | ICD-10-CM | POA: Diagnosis not present

## 2020-08-19 DIAGNOSIS — H401131 Primary open-angle glaucoma, bilateral, mild stage: Secondary | ICD-10-CM | POA: Diagnosis not present

## 2020-08-19 DIAGNOSIS — H524 Presbyopia: Secondary | ICD-10-CM | POA: Diagnosis not present

## 2020-08-25 ENCOUNTER — Ambulatory Visit: Payer: Medicare Other | Admitting: Cardiology

## 2020-08-25 ENCOUNTER — Encounter: Payer: Self-pay | Admitting: Cardiology

## 2020-08-25 ENCOUNTER — Other Ambulatory Visit: Payer: Self-pay

## 2020-08-25 VITALS — BP 136/80 | HR 81 | Temp 98.0°F | Resp 17 | Ht 71.0 in | Wt 271.0 lb

## 2020-08-25 DIAGNOSIS — Z6837 Body mass index (BMI) 37.0-37.9, adult: Secondary | ICD-10-CM | POA: Diagnosis not present

## 2020-08-25 DIAGNOSIS — K219 Gastro-esophageal reflux disease without esophagitis: Secondary | ICD-10-CM

## 2020-08-25 DIAGNOSIS — I739 Peripheral vascular disease, unspecified: Secondary | ICD-10-CM | POA: Diagnosis not present

## 2020-08-25 DIAGNOSIS — I10 Essential (primary) hypertension: Secondary | ICD-10-CM

## 2020-08-25 DIAGNOSIS — E7800 Pure hypercholesterolemia, unspecified: Secondary | ICD-10-CM

## 2020-08-25 MED ORDER — PANTOPRAZOLE SODIUM 40 MG PO TBEC
40.0000 mg | DELAYED_RELEASE_TABLET | Freq: Every day | ORAL | 2 refills | Status: DC
Start: 2020-08-25 — End: 2021-05-29

## 2020-08-25 NOTE — Progress Notes (Signed)
Primary Physician/Referring:  Velna Hatchet, MD  Patient ID: KENDARIUS WHISENHUNT, male    DOB: 17-Aug-1940, 80 y.o.   MRN: [id number]  Chief Complaint  Patient presents with  . PAD  . Hyperlipidemia  . Hypertension  . Follow-up    6 month   HPI:    SAFARI WALSINGHAM  is a 80 y.o. African-American male with hypertension, hyperlipidemia, uncontrolled diabetes mellitus with peripheral arterial disease and history of angioplasty to left SFA in 2018, inclusion body myositis at [facility] and has a positive NT5C1A antibody, morbid obesity.   He is walking with a walker. Discontinued Crestor in view of elevated CK enzymes from inclusion body myositis and started him on Zetia.  He is tolerating this and presents for a 9-month follow-up.  Except for cough when he lays down and also when he is speaking, he has no specific other complaints, states that his symptoms of claudication and leg cramps are remained stable since being on Pletal.  He has not noticed any ulceration.  Denies chest pain, dyspnea, leg edema.   Past Medical History:  Diagnosis Date  . CKD (chronic kidney disease)    CKD stage III (01/2018)  . Daytime somnolence   . Diabetes mellitus without complication ([name])   . Fracture    right fibula/wears boot cast  . GERD (gastroesophageal reflux disease)   . Glaucoma   . Hypercholesteremia   . Hyperlipidemia   . Hypertension   . Mold exposure   . PAD (peripheral artery disease) ([place])   . Sleep apnea    Past Surgical History:  Procedure Laterality Date  . LOWER EXTREMITY ANGIOGRAPHY N/A 06/12/2016   Procedure: Lower Extremity Angiography;  Surgeon: Adrian Prows, MD;  Location: [place] [facility];  Service: Cardiovascular;  Laterality: N/A;  . MUSCLE BIOPSY Right 05/06/2018   Procedure: right vastus lateralis muscle biopsy;  Surgeon: Earnie Larsson, MD;  Location: [place];  Service: Neurosurgery;  Laterality: Right;  . ORIF ANKLE FRACTURE Right 10/02/2012   Procedure: OPEN REDUCTION  INTERNAL FIXATION (ORIF) ANKLE FRACTURE;  Surgeon: Meredith Pel, MD;  Location: [facility];  Service: Orthopedics;  Laterality: Right;  . PERIPHERAL VASCULAR ATHERECTOMY Left 06/12/2016   Procedure: Peripheral Vascular Atherectomy-Left Popliteal;  Surgeon: Adrian Prows, MD;  Location: [place] [facility];  Service: Cardiovascular;  Laterality: Left;  . PERIPHERAL VASCULAR INTERVENTION Left 06/12/2016   Procedure: Peripheral Vascular Intervention- DCB Left Popliteal;  Surgeon: Adrian Prows, MD;  Location: [place] [facility];  Service: Cardiovascular;  Laterality: Left;   Social History   Tobacco Use  . Smoking status: Former Smoker    Packs/day: 1.00    Years: 20.00    Pack years: 20.00    Types: Cigarettes    Quit date: 08/22/1981    Years since quitting: 39.0  . Smokeless tobacco: Never Used  Substance Use Topics  . Alcohol use: No    Alcohol/week: 0.0 standard drinks    ROS  Review of Systems  Cardiovascular: Negative for claudication and leg swelling.  Respiratory: Positive for cough.   Musculoskeletal: Positive for joint pain, muscle weakness and myalgias.  Gastrointestinal: Negative for melena.  Neurological: Positive for paresthesias (feet) and weakness.   Objective  Blood pressure 136/80, pulse 81, temperature 98 F (36.7 C), resp. rate 17, height 5\' 11"  (1.803 m), weight 271 lb (122.9 kg), SpO2 97 %.  Vitals with BMI 08/25/2020 08/15/2020 02/25/2020  Height 5\' 11"  5\' 11"  5\' 11"   Weight 271 lbs 274  lbs 271 lbs  BMI 37.81 [id number]  Systolic [id number]  Diastolic 80 69 70  Pulse 81 70 82     Physical Exam Constitutional:      Comments: Morbidly obese in no acute distress.  Cardiovascular:     Rate and Rhythm: Normal rate and regular rhythm.     Pulses:          Carotid pulses are 2+ on the right side and 2+ on the left side with bruit.      Femoral pulses are 2+ on the right side and 2+ on the left side.      Popliteal pulses are 0 on the right side and 0 on the left  side.       Dorsalis pedis pulses are 1+ on the right side and 0 on the left side.       Posterior tibial pulses are 0 on the right side and 0 on the left side.     Heart sounds: Normal heart sounds. No murmur heard. No gallop.      Comments: 1+ bilateral pitting ankle edema.  Capillary refill normal. JVD difficult to see due to short neck. Pulmonary:     Effort: Pulmonary effort is normal.     Breath sounds: Normal breath sounds.  Abdominal:     General: Bowel sounds are normal.     Palpations: Abdomen is soft.     Comments: Obese. Pannus present    Laboratory examination:   Recent Labs    02/15/20 0921  NA 142  K 4.7  CL 101  CO2 27  GLUCOSE 113*  BUN 21  CREATININE 1.55*  CALCIUM 9.9  GFRNONAA 43*  GFRAA 49*   CrCl cannot be calculated (Patient's most recent lab result is older than the maximum 21 days allowed.).  CMP Latest Ref Rng & Units 02/15/2020 07/13/2019 12/15/2018  Glucose 65 - 99 mg/dL 113(H) 100(H) 247(H)  BUN 8 - 27 mg/dL 21 14 27(H)  Creatinine 0.76 - 1.27 mg/dL 1.55(H) 1.47(H) 1.44(H)  Sodium 134 - 144 mmol/L 142 141 135  Potassium 3.5 - 5.2 mmol/L 4.7 4.2 4.1  Chloride 96 - 106 mmol/L 101 102 97(L)  CO2 20 - 29 mmol/L 27 27 27   Calcium 8.6 - 10.2 mg/dL 9.9 9.1 9.1  Total Protein 6.0 - 8.5 g/dL - 6.1 -  Total Bilirubin 0.0 - 1.2 mg/dL - 0.3 -  Alkaline Phos 39 - 117 IU/L - 112 -  AST 0 - 40 IU/L - 26 -  ALT 0 - 44 IU/L - 21 -   CBC Latest Ref Rng & Units 12/15/2018 12/12/2018 12/08/2018  WBC 4.0 - 10.5 K/uL 4.9 4.0 3.4(L)  Hemoglobin 13.0 - 17.0 g/dL 13.8 12.6(L) 13.8  Hematocrit 39.0 - 52.0 % 41.1 37.6(L) 41.0  Platelets 150 - 400 K/uL 226 185 184   Lipid Panel Recent Labs    02/15/20 0921  CHOL 157  TRIG 67  LDLCALC 95  HDL 49    HEMOGLOBIN A1C Lab Results  Component Value Date   HGBA1C 7.3 (H) 11/08/2018   MPG 162.81 11/08/2018   TSH No results for input(s): TSH in the last 8760 hours.  Ref Range & Units 3 mo ago  (07/13/19) 1 yr  ago  (11/09/18) 1 yr ago  (11/08/18) 1 yr ago  (12/11/17)  Total CK 41.0 - 331.0 U/L 534High Panic  422High R, CM  777High R, CM  750High Panic R     External  labs 02/03/2019:   Cholesterol, total 132.000 m 02/17/2020 HDL 39.000 mg 02/17/2020 LDL 79.000 mg 02/17/2020 Triglycerides 72.000 mg 02/17/2020  A1C 7.600 % 02/17/2020 TSH 1.820 02/17/2020  Creatinine, Serum 1.550 mg/ 02/15/2020 Potassium 4.700 mEq 02/17/2020 ALT (SGPT) 30.000 IU/ 02/17/2020   Cholesterol, total 181.000 m 02/03/2019 HDL 54 MG/DL 02/03/2019 LDL 112.000 m 02/03/2019 Triglycerides 73.000 02/03/2019  A1C 7.000 % 02/03/2019 Hemoglobin 13.800 g/ 02/03/2019, INR 0.900 06/05/2016 Platelets 185.000 12/12/2018  Creatinine, Serum 1.500 mg/ 02/03/2019 Potassium 4.300 12/12/2018 ALT (SGPT) 18.000 uni 02/03/2019 TSH 1.700 02/03/2019  BNP 16.600 PG 04/17/2017  Medications and allergies   Allergies  Allergen Reactions  . Metformin And Related   . Simbrinza [Brinzolamide-Brimonidine]     Drainage, redness to eyes    Current Outpatient Medications on File Prior to Visit  Medication Sig Dispense Refill  . albuterol (VENTOLIN HFA) 108 (90 Base) MCG/ACT inhaler Inhale 2 puffs into the lungs every 4 (four) hours as needed for wheezing or shortness of breath. 18 g 5  . aspirin 81 MG chewable tablet Chew 81 mg by mouth at bedtime.     . calcium carbonate (CALCIUM 600) 600 MG TABS tablet Take 2 tablets (1,200 mg total) by mouth daily. 60 tablet 0  . Cholecalciferol (VITAMIN D3) 5000 units TABS Take 10,000 Units by mouth daily.     . cilostazol (PLETAL) 100 MG tablet TAKE 1 TABLET(100 MG) BY MOUTH TWICE DAILY 60 tablet 2  . ezetimibe (ZETIA) 10 MG tablet TAKE 1 TABLET(10 MG) BY MOUTH DAILY AFTER SUPPER 30 tablet 2  . famotidine (PEPCID) 20 MG tablet Take 1 tablet (20 mg total) by mouth at bedtime. 30 tablet 0  . folic acid (FOLVITE) 1 MG tablet Take 1 tablet (1 mg total) by mouth daily. 30 tablet 0  . furosemide (LASIX)  40 MG tablet Take 1 tablet (40 mg total) by mouth daily. 30 tablet 0  . gabapentin (NEURONTIN) 300 MG capsule Take 2 capsules (600 mg total) by mouth 3 (three) times daily. 90 capsule 0  . insulin aspart protamine - aspart (NOVOLOG MIX 70/30 FLEXPEN) (70-30) 100 UNIT/ML FlexPen Inject 0.36 mLs (36 Units total) into the skin daily with breakfast. (Patient taking differently: Inject into the skin daily with breakfast. 60 units in AM and 50 units at night) 15 mL 11  . latanoprost (XALATAN) 0.005 % ophthalmic solution Place 1 drop into both eyes at bedtime.    Marland Kitchen losartan (COZAAR) 25 MG tablet TAKE 1 TABLET(25 MG) BY MOUTH DAILY 30 tablet 2  . magnesium gluconate (MAGONATE) 500 MG tablet Take 500 mg by mouth daily.    . metoprolol tartrate (LOPRESSOR) 25 MG tablet Take 1 tablet (25 mg total) by mouth 2 (two) times daily. 60 tablet 0  . Multiple Vitamin (MULTIVITAMIN) capsule Take 1 capsule by mouth daily.     Marland Kitchen oxybutynin (DITROPAN) 5 MG tablet Take 1 tablet (5 mg total) by mouth 2 (two) times daily. 60 tablet 0  . potassium chloride SA (K-DUR) 20 MEQ tablet Take 1 tablet (20 mEq total) by mouth daily. 30 tablet 0  . rosuvastatin (CRESTOR) 10 MG tablet Take 1 tablet (10 mg total) by mouth once a week. 90 tablet 3  . tamsulosin (FLOMAX) 0.4 MG CAPS capsule Take 0.4 mg by mouth daily.    . timolol (TIMOPTIC) 0.5 % ophthalmic solution INT 1 GTT IN EACH EYE BID    . vitamin C (VITAMIN C) 500 MG tablet Take 1 tablet (500 mg total) by  mouth daily. 30 tablet 0  . zinc sulfate 220 (50 Zn) MG capsule Take 1 capsule (220 mg total) by mouth daily. 30 capsule 0   No current facility-administered medications on file prior to visit.     Radiology:  No results found. BMP nodule Cardiac Studies:   Peripheral arteriogram 06/12/2016: Left SFA calcific 100% to 0% with CSI atherectomy and Drug coated stenting with 6.0x120 mm Zilver PTX. Left AT occluded prox and reconstitutes at ankle.  Lower Extremity Arterial  Duplex 06/24/2019:  No hemodynamically significant stenoses are identified in the lower  extremity arterial system. There is diffuse small vessel disease below  knee bilaterally. Left SFA and popliteal artery angioplasty site is patent.   This exam reveals moderately decreased perfusion of the right lower  extremity, noted at the anterior tibial and post tibial artery level (ABI  0.77) with severely abnormal waveform and mildly decreased perfusion of  the left lower extremity, noted at the anterior tibial and post tibial  artery level (ABI 0.85) with moderately abnormal waveform.  No significant change from 04/11/2017.  LexiscanTetrofosmin Stress Test  06/15/2019: Nondiagnostic ECG stress. Myocardial perfusion is normal. Stress LV EF: 59%.  No change from 05/14/2013. Low risk study.   Echocardiogram 06/24/2019:  Study Quality: Technically Difficult  Normal LV systolic function with visual EF 60-65%. Left ventricle cavity  is normal in size. Moderate left ventricular hypertrophy. Normal global  wall motion. Indeterminate diastolic filling pattern, elevated LAP. No obvious regional wall motion abnormalities. Calculated EF 69%.  Mild mitral regurgitation. Mild calcification of the mitral valve  annulus. No other significant valvular abnormalities.  Prior study dated 05/03/2016 Mild concentric hypertrophy of the left ventricle.  Normal global wall motion. Grade II diastolic dysfunction, elevated LAP.   EKG:   EKG 08/25/2020: Sinus rhythm with first-degree AV block at rate of 71 bpm, leftward axis, incomplete right bundle branch block.  Low-voltage limb leads.  Poor R wave progression, cannot exclude old anterior infarct.  No significant change from 06/11/2019.  Assessment     ICD-10-CM   1. Claudication in peripheral vascular disease (HCC)  I73.9   2. Essential hypertension  I10 EKG 12-Lead  3. Hypercholesteremia  E78.00   4. Class 2 severe obesity due to excess calories with serious  comorbidity and body mass index (BMI) of 37.0 to 37.9 in adult ([place])  E66.01    Z68.37   5. Gastroesophageal reflux disease without esophagitis  K21.9 pantoprazole (PROTONIX) 40 MG tablet    Meds ordered this encounter  Medications  . pantoprazole (PROTONIX) 40 MG tablet    Sig: Take 1 tablet (40 mg total) by mouth daily before breakfast. As needed for cough and acid reflux    Dispense:  30 tablet    Refill:  2    There are no discontinued medications.   Recommendations:   JOSSUE RUBENSTEIN  is a 80 y.o. African-American male with hypertension, hyperlipidemia, uncontrolled diabetes mellitus with peripheral arterial disease and history of angioplasty to left SFA in 2018, inclusion body myositis at [facility] and has a positive NT5C1A antibody, morbid obesity.   I had discontinued Crestor in view of elevated CK enzymes from inclusion body myositis and started him on Zetia.  He is tolerating this reviewed his external labs, lipids are under control with LDL closer to goal around 70.  I had a very long discussion with the patient that his morbid obesity is majorly contributing to his comorbidity, in view of inclusion body myositis, progression  from walking with the help of walker to being wheelchair-bound and completely disabled is a distinct possibility.  We discussed regarding dietary modification.  His dry cough is probably related to GERD.  Advised him to try Protonix 40 mg daily as needed.  Dyspnea is related to underlying obesity and deconditioning, do not suspect congestive heart failure.  With regard to peripheral arterial disease is remained stable without any severe cramping in his legs or limb threatening ischemia by physical exam and ABI has remained stable.  Continue present medication.  I will see him back on an annual basis.  Blood pressure is also well controlled.      Adrian Prows, MD, [facility] 08/25/2020, 1:30 PM Office: [phone]   CC: Marshell Garfinkel, MD

## 2020-08-30 DIAGNOSIS — M6281 Muscle weakness (generalized): Secondary | ICD-10-CM | POA: Diagnosis not present

## 2020-08-30 DIAGNOSIS — R262 Difficulty in walking, not elsewhere classified: Secondary | ICD-10-CM | POA: Diagnosis not present

## 2020-08-30 DIAGNOSIS — G4733 Obstructive sleep apnea (adult) (pediatric): Secondary | ICD-10-CM | POA: Diagnosis not present

## 2020-08-30 DIAGNOSIS — Z9989 Dependence on other enabling machines and devices: Secondary | ICD-10-CM | POA: Diagnosis not present

## 2020-08-30 DIAGNOSIS — G7241 Inclusion body myositis [IBM]: Secondary | ICD-10-CM | POA: Diagnosis not present

## 2020-08-30 DIAGNOSIS — R2681 Unsteadiness on feet: Secondary | ICD-10-CM | POA: Diagnosis not present

## 2020-08-30 DIAGNOSIS — Z9181 History of falling: Secondary | ICD-10-CM | POA: Diagnosis not present

## 2020-09-06 ENCOUNTER — Telehealth: Payer: Self-pay | Admitting: Adult Health

## 2020-09-06 DIAGNOSIS — R29898 Other symptoms and signs involving the musculoskeletal system: Secondary | ICD-10-CM

## 2020-09-06 DIAGNOSIS — G4733 Obstructive sleep apnea (adult) (pediatric): Secondary | ICD-10-CM

## 2020-09-06 DIAGNOSIS — G7241 Inclusion body myositis [IBM]: Secondary | ICD-10-CM

## 2020-09-06 NOTE — Telephone Encounter (Signed)
Andrew Larsen from [facility] called asking for V.O. for PT and OT evaluation. Andrew Larsen will then fax it over for the provider to sign. Andrew Larsen states it is ok to LVM if she does not pick up.

## 2020-09-08 NOTE — Telephone Encounter (Signed)
PT Monica from [facility] is asking for a call back re: the vebal orders for PT please call her at [phone], her voicemail is secure if a message has to be left

## 2020-09-08 NOTE — Telephone Encounter (Signed)
Gave VO ok for PT for pt. On Monica's secure VM.

## 2020-09-15 DIAGNOSIS — R2681 Unsteadiness on feet: Secondary | ICD-10-CM | POA: Diagnosis not present

## 2020-09-15 DIAGNOSIS — R262 Difficulty in walking, not elsewhere classified: Secondary | ICD-10-CM | POA: Diagnosis not present

## 2020-09-15 DIAGNOSIS — M6281 Muscle weakness (generalized): Secondary | ICD-10-CM | POA: Diagnosis not present

## 2020-09-21 DIAGNOSIS — R2681 Unsteadiness on feet: Secondary | ICD-10-CM | POA: Diagnosis not present

## 2020-09-21 DIAGNOSIS — M6281 Muscle weakness (generalized): Secondary | ICD-10-CM | POA: Diagnosis not present

## 2020-09-21 DIAGNOSIS — R262 Difficulty in walking, not elsewhere classified: Secondary | ICD-10-CM | POA: Diagnosis not present

## 2020-09-22 DIAGNOSIS — M6281 Muscle weakness (generalized): Secondary | ICD-10-CM | POA: Diagnosis not present

## 2020-09-22 DIAGNOSIS — Z741 Need for assistance with personal care: Secondary | ICD-10-CM | POA: Diagnosis not present

## 2020-09-22 DIAGNOSIS — R279 Unspecified lack of coordination: Secondary | ICD-10-CM | POA: Diagnosis not present

## 2020-09-27 DIAGNOSIS — R279 Unspecified lack of coordination: Secondary | ICD-10-CM | POA: Diagnosis not present

## 2020-09-27 DIAGNOSIS — M6281 Muscle weakness (generalized): Secondary | ICD-10-CM | POA: Diagnosis not present

## 2020-09-27 DIAGNOSIS — R2681 Unsteadiness on feet: Secondary | ICD-10-CM | POA: Diagnosis not present

## 2020-09-27 DIAGNOSIS — Z741 Need for assistance with personal care: Secondary | ICD-10-CM | POA: Diagnosis not present

## 2020-09-27 DIAGNOSIS — R262 Difficulty in walking, not elsewhere classified: Secondary | ICD-10-CM | POA: Diagnosis not present

## 2020-09-28 DIAGNOSIS — R279 Unspecified lack of coordination: Secondary | ICD-10-CM | POA: Diagnosis not present

## 2020-09-28 DIAGNOSIS — M6281 Muscle weakness (generalized): Secondary | ICD-10-CM | POA: Diagnosis not present

## 2020-09-28 DIAGNOSIS — Z741 Need for assistance with personal care: Secondary | ICD-10-CM | POA: Diagnosis not present

## 2020-09-30 DIAGNOSIS — M6281 Muscle weakness (generalized): Secondary | ICD-10-CM | POA: Diagnosis not present

## 2020-09-30 DIAGNOSIS — R2681 Unsteadiness on feet: Secondary | ICD-10-CM | POA: Diagnosis not present

## 2020-09-30 DIAGNOSIS — R262 Difficulty in walking, not elsewhere classified: Secondary | ICD-10-CM | POA: Diagnosis not present

## 2020-10-03 DIAGNOSIS — R262 Difficulty in walking, not elsewhere classified: Secondary | ICD-10-CM | POA: Diagnosis not present

## 2020-10-03 DIAGNOSIS — R279 Unspecified lack of coordination: Secondary | ICD-10-CM | POA: Diagnosis not present

## 2020-10-03 DIAGNOSIS — Z741 Need for assistance with personal care: Secondary | ICD-10-CM | POA: Diagnosis not present

## 2020-10-03 DIAGNOSIS — R2681 Unsteadiness on feet: Secondary | ICD-10-CM | POA: Diagnosis not present

## 2020-10-03 DIAGNOSIS — M6281 Muscle weakness (generalized): Secondary | ICD-10-CM | POA: Diagnosis not present

## 2020-10-05 DIAGNOSIS — Z741 Need for assistance with personal care: Secondary | ICD-10-CM | POA: Diagnosis not present

## 2020-10-05 DIAGNOSIS — M6281 Muscle weakness (generalized): Secondary | ICD-10-CM | POA: Diagnosis not present

## 2020-10-05 DIAGNOSIS — R262 Difficulty in walking, not elsewhere classified: Secondary | ICD-10-CM | POA: Diagnosis not present

## 2020-10-05 DIAGNOSIS — R2681 Unsteadiness on feet: Secondary | ICD-10-CM | POA: Diagnosis not present

## 2020-10-05 DIAGNOSIS — R279 Unspecified lack of coordination: Secondary | ICD-10-CM | POA: Diagnosis not present

## 2020-10-10 DIAGNOSIS — R2681 Unsteadiness on feet: Secondary | ICD-10-CM | POA: Diagnosis not present

## 2020-10-10 DIAGNOSIS — R262 Difficulty in walking, not elsewhere classified: Secondary | ICD-10-CM | POA: Diagnosis not present

## 2020-10-10 DIAGNOSIS — Z741 Need for assistance with personal care: Secondary | ICD-10-CM | POA: Diagnosis not present

## 2020-10-10 DIAGNOSIS — R279 Unspecified lack of coordination: Secondary | ICD-10-CM | POA: Diagnosis not present

## 2020-10-10 DIAGNOSIS — M6281 Muscle weakness (generalized): Secondary | ICD-10-CM | POA: Diagnosis not present

## 2020-10-12 DIAGNOSIS — Z741 Need for assistance with personal care: Secondary | ICD-10-CM | POA: Diagnosis not present

## 2020-10-12 DIAGNOSIS — M6281 Muscle weakness (generalized): Secondary | ICD-10-CM | POA: Diagnosis not present

## 2020-10-12 DIAGNOSIS — R279 Unspecified lack of coordination: Secondary | ICD-10-CM | POA: Diagnosis not present

## 2020-10-18 ENCOUNTER — Other Ambulatory Visit: Payer: Self-pay | Admitting: Cardiology

## 2020-10-18 DIAGNOSIS — I739 Peripheral vascular disease, unspecified: Secondary | ICD-10-CM

## 2020-10-19 DIAGNOSIS — M6281 Muscle weakness (generalized): Secondary | ICD-10-CM | POA: Diagnosis not present

## 2020-10-19 DIAGNOSIS — R2681 Unsteadiness on feet: Secondary | ICD-10-CM | POA: Diagnosis not present

## 2020-10-19 DIAGNOSIS — R262 Difficulty in walking, not elsewhere classified: Secondary | ICD-10-CM | POA: Diagnosis not present

## 2020-10-19 DIAGNOSIS — R279 Unspecified lack of coordination: Secondary | ICD-10-CM | POA: Diagnosis not present

## 2020-10-19 DIAGNOSIS — Z741 Need for assistance with personal care: Secondary | ICD-10-CM | POA: Diagnosis not present

## 2020-10-20 DIAGNOSIS — R279 Unspecified lack of coordination: Secondary | ICD-10-CM | POA: Diagnosis not present

## 2020-10-20 DIAGNOSIS — Z741 Need for assistance with personal care: Secondary | ICD-10-CM | POA: Diagnosis not present

## 2020-10-20 DIAGNOSIS — M6281 Muscle weakness (generalized): Secondary | ICD-10-CM | POA: Diagnosis not present

## 2020-10-21 DIAGNOSIS — R2681 Unsteadiness on feet: Secondary | ICD-10-CM | POA: Diagnosis not present

## 2020-10-21 DIAGNOSIS — R262 Difficulty in walking, not elsewhere classified: Secondary | ICD-10-CM | POA: Diagnosis not present

## 2020-10-21 DIAGNOSIS — M6281 Muscle weakness (generalized): Secondary | ICD-10-CM | POA: Diagnosis not present

## 2020-10-24 DIAGNOSIS — R2681 Unsteadiness on feet: Secondary | ICD-10-CM | POA: Diagnosis not present

## 2020-10-24 DIAGNOSIS — M6281 Muscle weakness (generalized): Secondary | ICD-10-CM | POA: Diagnosis not present

## 2020-10-24 DIAGNOSIS — Z741 Need for assistance with personal care: Secondary | ICD-10-CM | POA: Diagnosis not present

## 2020-10-24 DIAGNOSIS — R262 Difficulty in walking, not elsewhere classified: Secondary | ICD-10-CM | POA: Diagnosis not present

## 2020-10-24 DIAGNOSIS — R279 Unspecified lack of coordination: Secondary | ICD-10-CM | POA: Diagnosis not present

## 2020-10-26 DIAGNOSIS — R2681 Unsteadiness on feet: Secondary | ICD-10-CM | POA: Diagnosis not present

## 2020-10-26 DIAGNOSIS — R279 Unspecified lack of coordination: Secondary | ICD-10-CM | POA: Diagnosis not present

## 2020-10-26 DIAGNOSIS — Z741 Need for assistance with personal care: Secondary | ICD-10-CM | POA: Diagnosis not present

## 2020-10-26 DIAGNOSIS — M6281 Muscle weakness (generalized): Secondary | ICD-10-CM | POA: Diagnosis not present

## 2020-10-26 DIAGNOSIS — R262 Difficulty in walking, not elsewhere classified: Secondary | ICD-10-CM | POA: Diagnosis not present

## 2020-11-01 DIAGNOSIS — Z741 Need for assistance with personal care: Secondary | ICD-10-CM | POA: Diagnosis not present

## 2020-11-01 DIAGNOSIS — R279 Unspecified lack of coordination: Secondary | ICD-10-CM | POA: Diagnosis not present

## 2020-11-01 DIAGNOSIS — M6281 Muscle weakness (generalized): Secondary | ICD-10-CM | POA: Diagnosis not present

## 2020-11-02 DIAGNOSIS — Z741 Need for assistance with personal care: Secondary | ICD-10-CM | POA: Diagnosis not present

## 2020-11-02 DIAGNOSIS — M6281 Muscle weakness (generalized): Secondary | ICD-10-CM | POA: Diagnosis not present

## 2020-11-02 DIAGNOSIS — R279 Unspecified lack of coordination: Secondary | ICD-10-CM | POA: Diagnosis not present

## 2020-11-03 DIAGNOSIS — R262 Difficulty in walking, not elsewhere classified: Secondary | ICD-10-CM | POA: Diagnosis not present

## 2020-11-03 DIAGNOSIS — M6281 Muscle weakness (generalized): Secondary | ICD-10-CM | POA: Diagnosis not present

## 2020-11-03 DIAGNOSIS — R2681 Unsteadiness on feet: Secondary | ICD-10-CM | POA: Diagnosis not present

## 2020-11-04 DIAGNOSIS — M6281 Muscle weakness (generalized): Secondary | ICD-10-CM | POA: Diagnosis not present

## 2020-11-04 DIAGNOSIS — R2681 Unsteadiness on feet: Secondary | ICD-10-CM | POA: Diagnosis not present

## 2020-11-04 DIAGNOSIS — R262 Difficulty in walking, not elsewhere classified: Secondary | ICD-10-CM | POA: Diagnosis not present

## 2020-11-06 ENCOUNTER — Other Ambulatory Visit: Payer: Self-pay | Admitting: Cardiology

## 2020-11-06 DIAGNOSIS — E7800 Pure hypercholesterolemia, unspecified: Secondary | ICD-10-CM

## 2020-11-07 DIAGNOSIS — M6281 Muscle weakness (generalized): Secondary | ICD-10-CM | POA: Diagnosis not present

## 2020-11-07 DIAGNOSIS — R262 Difficulty in walking, not elsewhere classified: Secondary | ICD-10-CM | POA: Diagnosis not present

## 2020-11-07 DIAGNOSIS — R2681 Unsteadiness on feet: Secondary | ICD-10-CM | POA: Diagnosis not present

## 2020-11-08 DIAGNOSIS — R2681 Unsteadiness on feet: Secondary | ICD-10-CM | POA: Diagnosis not present

## 2020-11-08 DIAGNOSIS — R262 Difficulty in walking, not elsewhere classified: Secondary | ICD-10-CM | POA: Diagnosis not present

## 2020-11-08 DIAGNOSIS — M6281 Muscle weakness (generalized): Secondary | ICD-10-CM | POA: Diagnosis not present

## 2020-11-09 DIAGNOSIS — Z741 Need for assistance with personal care: Secondary | ICD-10-CM | POA: Diagnosis not present

## 2020-11-09 DIAGNOSIS — M6281 Muscle weakness (generalized): Secondary | ICD-10-CM | POA: Diagnosis not present

## 2020-11-09 DIAGNOSIS — R279 Unspecified lack of coordination: Secondary | ICD-10-CM | POA: Diagnosis not present

## 2020-11-10 DIAGNOSIS — R279 Unspecified lack of coordination: Secondary | ICD-10-CM | POA: Diagnosis not present

## 2020-11-10 DIAGNOSIS — Z741 Need for assistance with personal care: Secondary | ICD-10-CM | POA: Diagnosis not present

## 2020-11-10 DIAGNOSIS — M6281 Muscle weakness (generalized): Secondary | ICD-10-CM | POA: Diagnosis not present

## 2020-11-14 DIAGNOSIS — R2681 Unsteadiness on feet: Secondary | ICD-10-CM | POA: Diagnosis not present

## 2020-11-14 DIAGNOSIS — M6281 Muscle weakness (generalized): Secondary | ICD-10-CM | POA: Diagnosis not present

## 2020-11-14 DIAGNOSIS — Z741 Need for assistance with personal care: Secondary | ICD-10-CM | POA: Diagnosis not present

## 2020-11-14 DIAGNOSIS — R279 Unspecified lack of coordination: Secondary | ICD-10-CM | POA: Diagnosis not present

## 2020-11-14 DIAGNOSIS — R262 Difficulty in walking, not elsewhere classified: Secondary | ICD-10-CM | POA: Diagnosis not present

## 2020-11-16 DIAGNOSIS — R279 Unspecified lack of coordination: Secondary | ICD-10-CM | POA: Diagnosis not present

## 2020-11-16 DIAGNOSIS — M6281 Muscle weakness (generalized): Secondary | ICD-10-CM | POA: Diagnosis not present

## 2020-11-16 DIAGNOSIS — Z741 Need for assistance with personal care: Secondary | ICD-10-CM | POA: Diagnosis not present

## 2020-12-15 DIAGNOSIS — G4733 Obstructive sleep apnea (adult) (pediatric): Secondary | ICD-10-CM | POA: Diagnosis not present

## 2020-12-19 DIAGNOSIS — H401131 Primary open-angle glaucoma, bilateral, mild stage: Secondary | ICD-10-CM | POA: Diagnosis not present

## 2021-01-14 ENCOUNTER — Other Ambulatory Visit: Payer: Self-pay | Admitting: Cardiology

## 2021-01-14 DIAGNOSIS — I739 Peripheral vascular disease, unspecified: Secondary | ICD-10-CM

## 2021-01-19 ENCOUNTER — Other Ambulatory Visit: Payer: Self-pay | Admitting: Student

## 2021-01-19 DIAGNOSIS — E7800 Pure hypercholesterolemia, unspecified: Secondary | ICD-10-CM

## 2021-02-15 ENCOUNTER — Other Ambulatory Visit: Payer: Self-pay

## 2021-02-15 ENCOUNTER — Ambulatory Visit: Payer: PPO | Admitting: Adult Health

## 2021-02-15 ENCOUNTER — Encounter: Payer: Self-pay | Admitting: Adult Health

## 2021-02-15 VITALS — BP 126/73 | HR 81 | Ht 71.0 in | Wt 271.4 lb

## 2021-02-15 DIAGNOSIS — G4733 Obstructive sleep apnea (adult) (pediatric): Secondary | ICD-10-CM | POA: Diagnosis not present

## 2021-02-15 DIAGNOSIS — Z9989 Dependence on other enabling machines and devices: Secondary | ICD-10-CM | POA: Diagnosis not present

## 2021-02-15 NOTE — Patient Instructions (Signed)
Continue using CPAP nightly and greater than 4 hours each night °If your symptoms worsen or you develop new symptoms please let us know.  ° °

## 2021-02-15 NOTE — Progress Notes (Signed)
PATIENT: Andrew Larsen DOB: [birth year]  REASON FOR VISIT: follow up HISTORY FROM: patient  HISTORY OF PRESENT ILLNESS: Today 02/15/21:  Andrew Larsen is a 80 year old male with a history of obstructive sleep apnea on CPAP.  He returns today for follow-up.  He reports that the CPAP is working well.  He denies any new issues.  He returns today for an evaluation.    08/15/20: Andrew Larsen is a 80 year old male with a history of obstructive sleep apnea on CPAP.  His download indicates that he uses machine 24 out of 30 days for compliance of 80%.  He uses machine greater than 4 hours for compliance of 60%.  On average he uses his machine 4 hours and 41 minutes.  His residual AHI is 6.9 on 11 cm of water.  Reports that some night he has to take the machine off because he feels as if he is not getting enough pressure.  He returns today for an evaluation.  HISTORY 02/15/20: He reports more weakness in his legs.  He fell a few weeks ago as he lost his balance but he also was not using his walker.  He has been using a 2 wheeled walker consistently.  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  Unfortunately, he contracted COVID-19 and was hospitalized from November 03 2018 through August third 2020 and subsequent inpatient rehab from August third 2020 through August 24th 2020.  He is followed by cardiology and pulmonology.  He has not seen his [facility] neurologist in several months.  He would like to get some outpatient physical therapy.  He has not used his CPAP machine after he found out that his machine was on recall.  He has registered his machine on the website but has not talked to his DME company about a replacement yet.  He has used an ozone based cleaning machine on his CPAP machine and is advised to no longer use it for now until we have a better sense for when he gets a replacement machine.  He does admit that he does not sleep as well without his machine.     REVIEW OF  SYSTEMS: Out of a complete 14 system review of symptoms, the patient complains only of the following symptoms, and all other reviewed systems are negative.   [id number]  ALLERGIES: Allergies  Allergen Reactions   Metformin And Related    Simbrinza [Brinzolamide-Brimonidine]     Drainage, redness to eyes    HOME MEDICATIONS: Outpatient Medications Prior to Visit  Medication Sig Dispense Refill   albuterol (VENTOLIN HFA) 108 (90 Base) MCG/ACT inhaler Inhale 2 puffs into the lungs every 4 (four) hours as needed for wheezing or shortness of breath. 18 g 5   aspirin 81 MG chewable tablet Chew 81 mg by mouth at bedtime.      calcium carbonate (CALCIUM 600) 600 MG TABS tablet Take 2 tablets (1,200 mg total) by mouth daily. 60 tablet 0   Cholecalciferol (VITAMIN D3) 5000 units TABS Take 10,000 Units by mouth daily.      cilostazol (PLETAL) 100 MG tablet TAKE 1 TABLET(100 MG) BY MOUTH TWICE DAILY 60 tablet 2   ezetimibe (ZETIA) 10 MG tablet TAKE 1 TABLET(10 MG) BY MOUTH DAILY AFTER AND SUPPER 30 tablet 2   famotidine (PEPCID) 20 MG tablet Take 1 tablet (20 mg total) by mouth at bedtime. 30 tablet 0   folic acid (FOLVITE) 1 MG tablet Take 1 tablet (1 mg  total) by mouth daily. 30 tablet 0   furosemide (LASIX) 40 MG tablet Take 1 tablet (40 mg total) by mouth daily. 30 tablet 0   gabapentin (NEURONTIN) 300 MG capsule Take 2 capsules (600 mg total) by mouth 3 (three) times daily. 90 capsule 0   insulin aspart protamine - aspart (NOVOLOG MIX 70/30 FLEXPEN) (70-30) 100 UNIT/ML FlexPen Inject 0.36 mLs (36 Units total) into the skin daily with breakfast. (Patient taking differently: Inject into the skin daily with breakfast. 60 units in AM and 50 units at night) 15 mL 11   latanoprost (XALATAN) 0.005 % ophthalmic solution Place 1 drop into both eyes at bedtime.     losartan (COZAAR) 25 MG tablet TAKE 1 TABLET(25 MG) BY MOUTH DAILY 30 tablet 2   magnesium gluconate (MAGONATE) 500 MG tablet Take 500 mg by mouth  daily.     metoprolol tartrate (LOPRESSOR) 25 MG tablet Take 1 tablet (25 mg total) by mouth 2 (two) times daily. 60 tablet 0   Multiple Vitamin (MULTIVITAMIN) capsule Take 1 capsule by mouth daily.      oxybutynin (DITROPAN) 5 MG tablet Take 1 tablet (5 mg total) by mouth 2 (two) times daily. 60 tablet 0   pantoprazole (PROTONIX) 40 MG tablet Take 1 tablet (40 mg total) by mouth daily before breakfast. As needed for cough and acid reflux 30 tablet 2   potassium chloride SA (K-DUR) 20 MEQ tablet Take 1 tablet (20 mEq total) by mouth daily. 30 tablet 0   rosuvastatin (CRESTOR) 10 MG tablet Take 1 tablet (10 mg total) by mouth once a week. 90 tablet 3   tamsulosin (FLOMAX) 0.4 MG CAPS capsule Take 0.4 mg by mouth daily.     timolol (TIMOPTIC) 0.5 % ophthalmic solution INT 1 GTT IN EACH EYE BID     vitamin C (VITAMIN C) 500 MG tablet Take 1 tablet (500 mg total) by mouth daily. 30 tablet 0   zinc sulfate 220 (50 Zn) MG capsule Take 1 capsule (220 mg total) by mouth daily. 30 capsule 0   No facility-administered medications prior to visit.    PAST MEDICAL HISTORY: Past Medical History:  Diagnosis Date   CKD (chronic kidney disease)    CKD stage III (01/2018)   Daytime somnolence    Diabetes mellitus without complication (HCC)    Fracture    right fibula/wears boot cast   GERD (gastroesophageal reflux disease)    Glaucoma    Hypercholesteremia    Hyperlipidemia    Hypertension    Mold exposure    PAD (peripheral artery disease) ([place])    Sleep apnea     PAST SURGICAL HISTORY: Past Surgical History:  Procedure Laterality Date   LOWER EXTREMITY ANGIOGRAPHY N/A 06/12/2016   Procedure: Lower Extremity Angiography;  Surgeon: Adrian Prows, MD;  Location: [facility];  Service: Cardiovascular;  Laterality: N/A;   MUSCLE BIOPSY Right 05/06/2018   Procedure: right vastus lateralis muscle biopsy;  Surgeon: Earnie Larsson, MD;  Location: [facility];  Service: Neurosurgery;  Laterality: Right;   ORIF  ANKLE FRACTURE Right 10/02/2012   Procedure: OPEN REDUCTION INTERNAL FIXATION (ORIF) ANKLE FRACTURE;  Surgeon: Meredith Pel, MD;  Location[facility];  Service: Orthopedics;  Laterality: Right;   PERIPHERAL VASCULAR ATHERECTOMY Left 06/12/2016   Procedure: Peripheral Vascular Atherectomy-Left Popliteal;  Surgeon: Adrian Prows, MD;  Location: [facility];  Service: Cardiovascular;  Laterality: Left;   PERIPHERAL VASCULAR INTERVENTION Left 06/12/2016   Procedure: Peripheral Vascular  Intervention- DCB Left Popliteal;  Surgeon: Adrian Prows, MD;  Location: [facility];  Service: Cardiovascular;  Laterality: Left;    FAMILY HISTORY: Family History  Problem Relation Age of Onset   Heart disease Father    Prostate cancer Brother    Cancer Brother    Diabetes Brother    Colon cancer Neg Hx     SOCIAL HISTORY: Social History   Socioeconomic History   Marital status: Widowed    Spouse name: Not on file   Number of children: 1   Years of education: Not on file   Highest education level: Not on file  Occupational History   Not on file  Tobacco Use   Smoking status: Former    Packs/day: 1.00    Years: 20.00    Pack years: 20.00    Types: Cigarettes    Quit date: 08/22/1981    Years since quitting: 39.5   Smokeless tobacco: Never  Vaping Use   Vaping Use: Never used  Substance and Sexual Activity   Alcohol use: No    Alcohol/week: 0.0 standard drinks   Drug use: No   Sexual activity: Never  Other Topics Concern   Not on file  Social History Narrative   ** Merged History Encounter **       Social Determinants of Health   Financial Resource Strain: Not on file  Food Insecurity: Not on file  Transportation Needs: Not on file  Physical Activity: Not on file  Stress: Not on file  Social Connections: Not on file  Intimate Partner Violence: Not on file      PHYSICAL EXAM  Vitals:   02/15/21 1133  Weight: 271 lb 6.4 oz (123.1 kg)  Height: 5\' 11"  (1.803 m)   Body  mass index is 37.85 kg/m.  Generalized: Well developed, in no acute distress  Chest: Lungs clear to auscultation bilaterally  Neurological examination  Mentation: Alert oriented to time, place, history taking. Follows all commands speech and language fluent Cranial nerve II-XII: Extraocular movements were full, visual field were full on confrontational test Head turning and shoulder shrug  were normal and symmetric. Motor: The motor testing reveals 5 over 5 strength of all 4 extremities. Good symmetric motor tone is noted throughout.  Sensory: Sensory testing is intact to soft touch on all 4 extremities. No evidence of extinction is noted.  Gait and station: Gait is normal.    DIAGNOSTIC DATA (LABS, IMAGING, TESTING) - I reviewed patient records, labs, notes, testing and imaging myself where available.  Lab Results  Component Value Date   WBC 4.9 12/15/2018   HGB 13.8 12/15/2018   HCT 41.1 12/15/2018   MCV 94.1 12/15/2018   PLT 226 12/15/2018      Component Value Date/Time   NA 142 02/15/2020 0921   K 4.7 02/15/2020 0921   CL 101 02/15/2020 0921   CO2 27 02/15/2020 0921   GLUCOSE 113 (H) 02/15/2020 0921   GLUCOSE 247 (H) 12/15/2018 0816   BUN 21 02/15/2020 0921   CREATININE 1.55 (H) 02/15/2020 0921   CALCIUM 9.9 02/15/2020 0921   PROT 6.1 07/13/2019 0833   ALBUMIN 3.9 07/13/2019 0833   AST 26 07/13/2019 0833   ALT 21 07/13/2019 0833   ALKPHOS 112 07/13/2019 0833   BILITOT 0.3 07/13/2019 0833   GFRNONAA 43 (L) 02/15/2020 0921   GFRAA 49 (L) 02/15/2020 0921   Lab Results  Component Value Date   CHOL 157 02/15/2020   HDL 49 02/15/2020  [place] 95 02/15/2020   TRIG 67 02/15/2020   CHOLHDL 2.7 11/08/2018   Lab Results  Component Value Date   HGBA1C 7.3 (H) 11/08/2018   No results found for: VITAMINB12 Lab Results  Component Value Date   TSH 3.910 07/13/2019      ASSESSMENT AND PLAN 80 y.o. year old male  has a past medical history of CKD (chronic kidney  disease), Daytime somnolence, Diabetes mellitus without complication ([place]), Fracture, GERD (gastroesophageal reflux disease), Glaucoma, Hypercholesteremia, Hyperlipidemia, Hypertension, Mold exposure, PAD (peripheral artery disease) ([place]), and Sleep apnea. here with:  OSA on CPAP  - CPAP compliance excellent -Residual AHI in normal range - Encourage patient to use CPAP nightly and > 4 hours each night - F/U in  1 year or sooner if needed    Ward Givens, MSN, NP-C 02/15/2021, 11:36 AM [facility] [address] [place], [place] [address] [phone]

## 2021-02-21 DIAGNOSIS — Z125 Encounter for screening for malignant neoplasm of prostate: Secondary | ICD-10-CM | POA: Diagnosis not present

## 2021-02-21 DIAGNOSIS — E785 Hyperlipidemia, unspecified: Secondary | ICD-10-CM | POA: Diagnosis not present

## 2021-02-21 DIAGNOSIS — E1169 Type 2 diabetes mellitus with other specified complication: Secondary | ICD-10-CM | POA: Diagnosis not present

## 2021-02-28 DIAGNOSIS — D692 Other nonthrombocytopenic purpura: Secondary | ICD-10-CM | POA: Diagnosis not present

## 2021-02-28 DIAGNOSIS — I739 Peripheral vascular disease, unspecified: Secondary | ICD-10-CM | POA: Diagnosis not present

## 2021-02-28 DIAGNOSIS — N1832 Chronic kidney disease, stage 3b: Secondary | ICD-10-CM | POA: Diagnosis not present

## 2021-02-28 DIAGNOSIS — Z0000 Encounter for general adult medical examination without abnormal findings: Secondary | ICD-10-CM | POA: Diagnosis not present

## 2021-02-28 DIAGNOSIS — E1169 Type 2 diabetes mellitus with other specified complication: Secondary | ICD-10-CM | POA: Diagnosis not present

## 2021-02-28 DIAGNOSIS — I10 Essential (primary) hypertension: Secondary | ICD-10-CM | POA: Diagnosis not present

## 2021-02-28 DIAGNOSIS — Z23 Encounter for immunization: Secondary | ICD-10-CM | POA: Diagnosis not present

## 2021-02-28 DIAGNOSIS — I700 Atherosclerosis of aorta: Secondary | ICD-10-CM | POA: Diagnosis not present

## 2021-02-28 DIAGNOSIS — N3281 Overactive bladder: Secondary | ICD-10-CM | POA: Diagnosis not present

## 2021-02-28 DIAGNOSIS — E785 Hyperlipidemia, unspecified: Secondary | ICD-10-CM | POA: Diagnosis not present

## 2021-02-28 DIAGNOSIS — R972 Elevated prostate specific antigen [PSA]: Secondary | ICD-10-CM | POA: Diagnosis not present

## 2021-03-15 DIAGNOSIS — G4733 Obstructive sleep apnea (adult) (pediatric): Secondary | ICD-10-CM | POA: Diagnosis not present

## 2021-04-07 DIAGNOSIS — E7800 Pure hypercholesterolemia, unspecified: Secondary | ICD-10-CM | POA: Diagnosis not present

## 2021-04-07 DIAGNOSIS — E1165 Type 2 diabetes mellitus with hyperglycemia: Secondary | ICD-10-CM | POA: Diagnosis not present

## 2021-04-10 ENCOUNTER — Other Ambulatory Visit: Payer: Self-pay

## 2021-04-10 DIAGNOSIS — E1165 Type 2 diabetes mellitus with hyperglycemia: Secondary | ICD-10-CM | POA: Diagnosis not present

## 2021-04-10 DIAGNOSIS — I10 Essential (primary) hypertension: Secondary | ICD-10-CM | POA: Diagnosis not present

## 2021-04-10 DIAGNOSIS — I739 Peripheral vascular disease, unspecified: Secondary | ICD-10-CM | POA: Diagnosis not present

## 2021-04-10 DIAGNOSIS — G729 Myopathy, unspecified: Secondary | ICD-10-CM | POA: Diagnosis not present

## 2021-04-10 DIAGNOSIS — N189 Chronic kidney disease, unspecified: Secondary | ICD-10-CM | POA: Diagnosis not present

## 2021-04-10 DIAGNOSIS — E7800 Pure hypercholesterolemia, unspecified: Secondary | ICD-10-CM | POA: Diagnosis not present

## 2021-04-10 MED ORDER — CILOSTAZOL 100 MG PO TABS: ORAL_TABLET | ORAL | 2 refills | Status: DC

## 2021-04-11 DIAGNOSIS — H401131 Primary open-angle glaucoma, bilateral, mild stage: Secondary | ICD-10-CM | POA: Diagnosis not present

## 2021-04-20 ENCOUNTER — Other Ambulatory Visit: Payer: Self-pay | Admitting: Student

## 2021-04-20 DIAGNOSIS — E7800 Pure hypercholesterolemia, unspecified: Secondary | ICD-10-CM

## 2021-05-29 ENCOUNTER — Other Ambulatory Visit: Payer: Self-pay | Admitting: Cardiology

## 2021-05-29 DIAGNOSIS — K219 Gastro-esophageal reflux disease without esophagitis: Secondary | ICD-10-CM

## 2021-05-31 DIAGNOSIS — N401 Enlarged prostate with lower urinary tract symptoms: Secondary | ICD-10-CM | POA: Diagnosis not present

## 2021-05-31 DIAGNOSIS — R972 Elevated prostate specific antigen [PSA]: Secondary | ICD-10-CM | POA: Diagnosis not present

## 2021-05-31 DIAGNOSIS — R351 Nocturia: Secondary | ICD-10-CM | POA: Diagnosis not present

## 2021-06-20 DIAGNOSIS — G4733 Obstructive sleep apnea (adult) (pediatric): Secondary | ICD-10-CM | POA: Diagnosis not present

## 2021-07-03 ENCOUNTER — Other Ambulatory Visit: Payer: Self-pay | Admitting: Cardiology

## 2021-07-03 DIAGNOSIS — I739 Peripheral vascular disease, unspecified: Secondary | ICD-10-CM

## 2021-07-18 DIAGNOSIS — H401131 Primary open-angle glaucoma, bilateral, mild stage: Secondary | ICD-10-CM | POA: Diagnosis not present

## 2021-07-27 ENCOUNTER — Other Ambulatory Visit: Payer: Self-pay | Admitting: Student

## 2021-07-27 DIAGNOSIS — E7800 Pure hypercholesterolemia, unspecified: Secondary | ICD-10-CM

## 2021-08-05 DIAGNOSIS — S93492A Sprain of other ligament of left ankle, initial encounter: Secondary | ICD-10-CM | POA: Diagnosis not present

## 2021-08-05 DIAGNOSIS — S93491A Sprain of other ligament of right ankle, initial encounter: Secondary | ICD-10-CM | POA: Diagnosis not present

## 2021-08-14 DIAGNOSIS — M25571 Pain in right ankle and joints of right foot: Secondary | ICD-10-CM | POA: Diagnosis not present

## 2021-08-14 DIAGNOSIS — I5032 Chronic diastolic (congestive) heart failure: Secondary | ICD-10-CM | POA: Diagnosis not present

## 2021-08-14 DIAGNOSIS — R600 Localized edema: Secondary | ICD-10-CM | POA: Diagnosis not present

## 2021-08-14 DIAGNOSIS — I10 Essential (primary) hypertension: Secondary | ICD-10-CM | POA: Diagnosis not present

## 2021-08-14 DIAGNOSIS — N1832 Chronic kidney disease, stage 3b: Secondary | ICD-10-CM | POA: Diagnosis not present

## 2021-08-14 DIAGNOSIS — W19XXXD Unspecified fall, subsequent encounter: Secondary | ICD-10-CM | POA: Diagnosis not present

## 2021-08-18 DIAGNOSIS — R600 Localized edema: Secondary | ICD-10-CM | POA: Diagnosis not present

## 2021-08-18 DIAGNOSIS — N1831 Chronic kidney disease, stage 3a: Secondary | ICD-10-CM | POA: Diagnosis not present

## 2021-08-18 DIAGNOSIS — E785 Hyperlipidemia, unspecified: Secondary | ICD-10-CM | POA: Diagnosis not present

## 2021-08-18 DIAGNOSIS — N1832 Chronic kidney disease, stage 3b: Secondary | ICD-10-CM | POA: Diagnosis not present

## 2021-08-18 DIAGNOSIS — I10 Essential (primary) hypertension: Secondary | ICD-10-CM | POA: Diagnosis not present

## 2021-08-18 DIAGNOSIS — I5032 Chronic diastolic (congestive) heart failure: Secondary | ICD-10-CM | POA: Diagnosis not present

## 2021-08-18 DIAGNOSIS — R7989 Other specified abnormal findings of blood chemistry: Secondary | ICD-10-CM | POA: Diagnosis not present

## 2021-08-18 DIAGNOSIS — I129 Hypertensive chronic kidney disease with stage 1 through stage 4 chronic kidney disease, or unspecified chronic kidney disease: Secondary | ICD-10-CM | POA: Diagnosis not present

## 2021-08-23 ENCOUNTER — Other Ambulatory Visit: Payer: Self-pay | Admitting: Cardiology

## 2021-08-23 DIAGNOSIS — K219 Gastro-esophageal reflux disease without esophagitis: Secondary | ICD-10-CM

## 2021-08-25 ENCOUNTER — Ambulatory Visit: Payer: PPO | Admitting: Cardiology

## 2021-08-25 ENCOUNTER — Encounter: Payer: Self-pay | Admitting: Cardiology

## 2021-08-25 VITALS — BP 127/70 | HR 74 | Temp 98.0°F | Resp 17 | Ht 71.0 in | Wt 277.0 lb

## 2021-08-25 DIAGNOSIS — I739 Peripheral vascular disease, unspecified: Secondary | ICD-10-CM

## 2021-08-25 DIAGNOSIS — I10 Essential (primary) hypertension: Secondary | ICD-10-CM

## 2021-08-25 DIAGNOSIS — R600 Localized edema: Secondary | ICD-10-CM

## 2021-08-25 DIAGNOSIS — E7800 Pure hypercholesterolemia, unspecified: Secondary | ICD-10-CM | POA: Diagnosis not present

## 2021-08-25 DIAGNOSIS — G7241 Inclusion body myositis [IBM]: Secondary | ICD-10-CM | POA: Diagnosis not present

## 2021-08-25 NOTE — Progress Notes (Signed)
? ?Primary Physician/Referring:  Velna Hatchet, MD ? ?Patient ID: Andrew Larsen, male    DOB: Sep 14, 1940, 81 y.o.   MRN: [id number] ? ?Chief Complaint  ?Patient presents with  ? PERIPHERAL ARTERIAL DISEASE  ?  1 YEAR  ? Hypertension  ? Hyperlipidemia  ? ?HPI:   ? ?Andrew Larsen  is a 81 y.o. African-American male with hypertension, hyperlipidemia, uncontrolled diabetes mellitus with peripheral arterial disease and history of angioplasty to left SFA in 2018, inclusion body myositis at [facility] and has a positive NT5C1A antibody, morbid obesity.  ? ?He is not on a statin due to inclusion body myositis.  He is presently tolerating Zetia without any side effects.  He has not had any further leg cramps or symptoms suggestive of claudication.  He has not noticed any ulceration.  Denies chest pain, dyspnea.  He had a mechanical fall 2 weeks ago to 3 weeks ago and since then he has noticed increased leg edema for which she was started on Lasix by his PCP.  Leg edema has improved. ? ?Past Medical History:  ?Diagnosis Date  ? CKD (chronic kidney disease)   ? CKD stage III (01/2018)  ? Daytime somnolence   ? Diabetes mellitus without complication ([place])   ? Fracture   ? right fibula/wears boot cast  ? GERD (gastroesophageal reflux disease)   ? Glaucoma   ? Hypercholesteremia   ? Hyperlipidemia   ? Hypertension   ? Mold exposure   ? PAD (peripheral artery disease) ([place])   ? Sleep apnea   ? ?Past Surgical History:  ?Procedure Laterality Date  ? LOWER EXTREMITY ANGIOGRAPHY N/A 06/12/2016  ? Procedure: Lower Extremity Angiography;  Surgeon: Adrian Prows, MD;  Location: [facility];  Service: Cardiovascular;  Laterality: N/A;  ? MUSCLE BIOPSY Right 05/06/2018  ? Procedure: right vastus lateralis muscle biopsy;  Surgeon: Earnie Larsson, MD;  Location: [facility];  Service: Neurosurgery;  Laterality: Right;  ? ORIF ANKLE FRACTURE Right 10/02/2012  ? Procedure: OPEN REDUCTION INTERNAL FIXATION (ORIF) ANKLE FRACTURE;  Surgeon:  Meredith Pel, MD;  [facility];  Service: Orthopedics;  Laterality: Right;  ? PERIPHERAL VASCULAR ATHERECTOMY Left 06/12/2016  ? Procedure: Peripheral Vascular Atherectomy-Left Popliteal;  Surgeon: Adrian Prows, MD;  Location: [facility];  Service: Cardiovascular;  Laterality: Left;  ? PERIPHERAL VASCULAR INTERVENTION Left 06/12/2016  ? Procedure: Peripheral Vascular Intervention- DCB Left Popliteal;  Surgeon: Adrian Prows, MD;  Location: [facility];  Service: Cardiovascular;  Laterality: Left;  ? ?Social History  ? ?Tobacco Use  ? Smoking status: Former  ?  Packs/day: 1.00  ?  Years: 20.00  ?  Pack years: 20.00  ?  Types: Cigarettes  ?  Quit date: 08/22/1981  ?  Years since quitting: 40.0  ? Smokeless tobacco: Never  ?Substance Use Topics  ? Alcohol use: No  ?  Alcohol/week: 0.0 standard drinks  ? ? ?ROS  ?Review of Systems  ?Cardiovascular:  Negative for claudication and leg swelling.  ?Respiratory:  Positive for cough.   ?Musculoskeletal:  Positive for joint pain, muscle weakness and myalgias.  ?Gastrointestinal:  Negative for melena.  ?Neurological:  Positive for paresthesias (feet) and weakness.  ?Objective  ?Blood pressure 127/70, pulse 74, temperature 98 ?F (36.7 ?C), temperature source Temporal, resp. rate 17, height 5' 11" (1.803 m), weight 277 lb (125.6 kg), SpO2 93 %.  ? ?  08/25/2021  ?  2:04 PM 08/25/2021  ?  1:54 PM 02/15/2021  ?  11:33 AM  ?Vitals with BMI  ?Height  5' 11" 5' 11"  ?Weight  277 lbs 271 lbs 6 oz  ?BMI  38.65 37.87  ?Systolic 127 140 126  ?Diastolic 70 73 73  ?Pulse 74 78 81  ?  ? Physical Exam ?Constitutional:   ?   Comments: Morbidly obese in no acute distress.  ?Cardiovascular:  ?   Rate and Rhythm: Normal rate and regular rhythm.  ?   Pulses:     ?     Popliteal pulses are 0 on the right side and 0 on the left side.  ?     Dorsalis pedis pulses are 0 on the right side and 0 on the left side.  ?     Posterior tibial pulses are 0 on the right side and 0 on the left side.   ?   Heart sounds: Normal heart sounds. No murmur heard. ?  No gallop.  ?Pulmonary:  ?   Effort: Pulmonary effort is normal.  ?   Breath sounds: Normal breath sounds.  ?Abdominal:  ?   General: Bowel sounds are normal.  ?   Palpations: Abdomen is soft.  ?   Comments: Obese. Pannus present  ?Musculoskeletal:  ?   Right lower leg: Edema (2-3+ below-knee) present.  ?   Left lower leg: Edema (2-3 pads below-knee) present.  ? ?Laboratory examination:  ? ?External labs 02/03/2019:  ? ?Labs 04/07/2021: ? ?BUN 15, creatinine 1.22, EGFR 65 mill, potassium 4.8, LFTs normal. ? ?A1c 9.0%.  TSH normal at 2.94. ? ?Total cholesterol 140, triglycerides 143, HDL 45, LDL 70. ? ?Cholesterol, total 132.000 m 02/17/2020 ?HDL 39.000 mg 02/17/2020 ?LDL 79.000 mg 02/17/2020 ?Triglycerides 72.000 mg 02/17/2020 ? ?A1C 7.600 % 02/17/2020 ?TSH 1.820 02/17/2020 ?BNP 16.600 PG 04/17/2017 ? ?Medications and allergies  ? ?Allergies  ?Allergen Reactions  ? Metformin And Related   ? Simbrinza [Brinzolamide-Brimonidine]   ?  Drainage, redness to eyes  ?  ? ?Current Outpatient Medications:  ?  albuterol (VENTOLIN HFA) 108 (90 Base) MCG/ACT inhaler, Inhale 2 puffs into the lungs every 4 (four) hours as needed for wheezing or shortness of breath., Disp: 18 g, Rfl: 5 ?  aspirin 81 MG chewable tablet, Chew 81 mg by mouth at bedtime. , Disp: , Rfl:  ?  calcium carbonate (CALCIUM 600) 600 MG TABS tablet, Take 2 tablets (1,200 mg total) by mouth daily., Disp: 60 tablet, Rfl: 0 ?  Cholecalciferol (VITAMIN D3) 5000 units TABS, Take 10,000 Units by mouth daily. , Disp: , Rfl:  ?  ezetimibe (ZETIA) 10 MG tablet, TAKE 1 TABLET(10 MG) BY MOUTH DAILY AFTER SUPPER, Disp: 30 tablet, Rfl: 2 ?  famotidine (PEPCID) 20 MG tablet, Take 1 tablet (20 mg total) by mouth at bedtime., Disp: 30 tablet, Rfl: 0 ?  folic acid (FOLVITE) 1 MG tablet, Take 1 tablet (1 mg total) by mouth daily., Disp: 30 tablet, Rfl: 0 ?  furosemide (LASIX) 40 MG tablet, Take 1 tablet (40 mg total) by  mouth daily., Disp: 30 tablet, Rfl: 0 ?  gabapentin (NEURONTIN) 300 MG capsule, Take 2 capsules (600 mg total) by mouth 3 (three) times daily., Disp: 90 capsule, Rfl: 0 ?  insulin aspart protamine - aspart (NOVOLOG MIX 70/30 FLEXPEN) (70-30) 100 UNIT/ML FlexPen, Inject 0.36 mLs (36 Units total) into the skin daily with breakfast. (Patient taking differently: Inject into the skin daily with breakfast. 60 units in AM and 50 units at night), Disp: 15 mL, Rfl: 11 ?    latanoprost (XALATAN) 0.005 % ophthalmic solution, Place 1 drop into both eyes at bedtime., Disp: , Rfl:  ?  losartan (COZAAR) 25 MG tablet, TAKE 1 TABLET(25 MG) BY MOUTH DAILY, Disp: 30 tablet, Rfl: 2 ?  magnesium gluconate (MAGONATE) 500 MG tablet, Take 500 mg by mouth daily., Disp: , Rfl:  ?  metoprolol tartrate (LOPRESSOR) 25 MG tablet, Take 1 tablet (25 mg total) by mouth 2 (two) times daily., Disp: 60 tablet, Rfl: 0 ?  Multiple Vitamin (MULTIVITAMIN) capsule, Take 1 capsule by mouth daily. , Disp: , Rfl:  ?  oxybutynin (DITROPAN) 5 MG tablet, Take 1 tablet (5 mg total) by mouth 2 (two) times daily., Disp: 60 tablet, Rfl: 0 ?  pantoprazole (PROTONIX) 40 MG tablet, TAKE 1 TABLET(40 MG) BY MOUTH DAILY BEFORE BREAKFAST AS NEEDED FOR COUGH OR ACID REFLUX, Disp: 30 tablet, Rfl: 2 ?  potassium chloride SA (K-DUR) 20 MEQ tablet, Take 1 tablet (20 mEq total) by mouth daily., Disp: 30 tablet, Rfl: 0 ?  rosuvastatin (CRESTOR) 10 MG tablet, Take 1 tablet (10 mg total) by mouth once a week., Disp: 90 tablet, Rfl: 3 ?  tamsulosin (FLOMAX) 0.4 MG CAPS capsule, Take 0.4 mg by mouth daily., Disp: , Rfl:  ?  timolol (TIMOPTIC) 0.5 % ophthalmic solution, INT 1 GTT IN EACH EYE BID, Disp: , Rfl:  ?  zinc sulfate 220 (50 Zn) MG capsule, Take 1 capsule (220 mg total) by mouth daily., Disp: 30 capsule, Rfl: 0  ? ?Radiology:  ?No results found. ?Cardiac Studies:  ? ?Peripheral arteriogram 06/12/2016: Left SFA calcific 100% to 0% with CSI atherectomy and Drug coated stenting  with 6.0x120 mm Zilver PTX. Left AT occluded prox and reconstitutes at ankle. ? ?Lower Extremity Arterial Duplex 06/24/2019:  ?No hemodynamically significant stenoses are identified in the lower  ?extremity

## 2021-08-30 DIAGNOSIS — D075 Carcinoma in situ of prostate: Secondary | ICD-10-CM | POA: Diagnosis not present

## 2021-08-30 DIAGNOSIS — C61 Malignant neoplasm of prostate: Secondary | ICD-10-CM | POA: Diagnosis not present

## 2021-08-30 DIAGNOSIS — R972 Elevated prostate specific antigen [PSA]: Secondary | ICD-10-CM | POA: Diagnosis not present

## 2021-09-01 ENCOUNTER — Emergency Department (HOSPITAL_COMMUNITY): Payer: PPO

## 2021-09-01 ENCOUNTER — Inpatient Hospital Stay (HOSPITAL_COMMUNITY): Payer: PPO

## 2021-09-01 ENCOUNTER — Encounter (HOSPITAL_COMMUNITY): Payer: Self-pay | Admitting: Emergency Medicine

## 2021-09-01 ENCOUNTER — Other Ambulatory Visit: Payer: Self-pay

## 2021-09-01 ENCOUNTER — Inpatient Hospital Stay (HOSPITAL_COMMUNITY)
Admission: EM | Admit: 2021-09-01 | Discharge: 2021-09-07 | DRG: 698 | Disposition: A | Discharge status: Skilled Nursing Facility | Payer: PPO | Attending: Family Medicine | Admitting: Family Medicine

## 2021-09-01 DIAGNOSIS — Z87891 Personal history of nicotine dependence: Secondary | ICD-10-CM

## 2021-09-01 DIAGNOSIS — R509 Fever, unspecified: Secondary | ICD-10-CM | POA: Diagnosis not present

## 2021-09-01 DIAGNOSIS — I959 Hypotension, unspecified: Secondary | ICD-10-CM | POA: Diagnosis not present

## 2021-09-01 DIAGNOSIS — N200 Calculus of kidney: Secondary | ICD-10-CM | POA: Diagnosis not present

## 2021-09-01 DIAGNOSIS — N1832 Chronic kidney disease, stage 3b: Secondary | ICD-10-CM | POA: Diagnosis not present

## 2021-09-01 DIAGNOSIS — E1151 Type 2 diabetes mellitus with diabetic peripheral angiopathy without gangrene: Secondary | ICD-10-CM | POA: Diagnosis not present

## 2021-09-01 DIAGNOSIS — E785 Hyperlipidemia, unspecified: Secondary | ICD-10-CM | POA: Diagnosis not present

## 2021-09-01 DIAGNOSIS — Z6838 Body mass index (BMI) 38.0-38.9, adult: Secondary | ICD-10-CM | POA: Diagnosis not present

## 2021-09-01 DIAGNOSIS — R079 Chest pain, unspecified: Secondary | ICD-10-CM | POA: Diagnosis not present

## 2021-09-01 DIAGNOSIS — N390 Urinary tract infection, site not specified: Secondary | ICD-10-CM

## 2021-09-01 DIAGNOSIS — R6520 Severe sepsis without septic shock: Secondary | ICD-10-CM | POA: Diagnosis present

## 2021-09-01 DIAGNOSIS — R5381 Other malaise: Secondary | ICD-10-CM | POA: Diagnosis present

## 2021-09-01 DIAGNOSIS — E7800 Pure hypercholesterolemia, unspecified: Secondary | ICD-10-CM | POA: Diagnosis present

## 2021-09-01 DIAGNOSIS — R2681 Unsteadiness on feet: Secondary | ICD-10-CM | POA: Diagnosis not present

## 2021-09-01 DIAGNOSIS — Z888 Allergy status to other drugs, medicaments and biological substances status: Secondary | ICD-10-CM

## 2021-09-01 DIAGNOSIS — R4189 Other symptoms and signs involving cognitive functions and awareness: Secondary | ICD-10-CM | POA: Diagnosis not present

## 2021-09-01 DIAGNOSIS — M6281 Muscle weakness (generalized): Secondary | ICD-10-CM | POA: Diagnosis not present

## 2021-09-01 DIAGNOSIS — A419 Sepsis, unspecified organism: Secondary | ICD-10-CM

## 2021-09-01 DIAGNOSIS — E119 Type 2 diabetes mellitus without complications: Secondary | ICD-10-CM | POA: Diagnosis present

## 2021-09-01 DIAGNOSIS — I248 Other forms of acute ischemic heart disease: Secondary | ICD-10-CM | POA: Diagnosis present

## 2021-09-01 DIAGNOSIS — I5030 Unspecified diastolic (congestive) heart failure: Secondary | ICD-10-CM | POA: Diagnosis not present

## 2021-09-01 DIAGNOSIS — R531 Weakness: Secondary | ICD-10-CM | POA: Diagnosis not present

## 2021-09-01 DIAGNOSIS — I129 Hypertensive chronic kidney disease with stage 1 through stage 4 chronic kidney disease, or unspecified chronic kidney disease: Secondary | ICD-10-CM | POA: Diagnosis present

## 2021-09-01 DIAGNOSIS — R0602 Shortness of breath: Secondary | ICD-10-CM | POA: Diagnosis not present

## 2021-09-01 DIAGNOSIS — F419 Anxiety disorder, unspecified: Secondary | ICD-10-CM | POA: Diagnosis present

## 2021-09-01 DIAGNOSIS — K6289 Other specified diseases of anus and rectum: Secondary | ICD-10-CM | POA: Diagnosis not present

## 2021-09-01 DIAGNOSIS — N183 Chronic kidney disease, stage 3 unspecified: Secondary | ICD-10-CM | POA: Diagnosis present

## 2021-09-01 DIAGNOSIS — A4151 Sepsis due to Escherichia coli [E. coli]: Secondary | ICD-10-CM | POA: Diagnosis not present

## 2021-09-01 DIAGNOSIS — J9601 Acute respiratory failure with hypoxia: Secondary | ICD-10-CM | POA: Diagnosis not present

## 2021-09-01 DIAGNOSIS — N3289 Other specified disorders of bladder: Secondary | ICD-10-CM | POA: Diagnosis not present

## 2021-09-01 DIAGNOSIS — R059 Cough, unspecified: Secondary | ICD-10-CM | POA: Diagnosis present

## 2021-09-01 DIAGNOSIS — R5383 Other fatigue: Secondary | ICD-10-CM | POA: Diagnosis not present

## 2021-09-01 DIAGNOSIS — I509 Heart failure, unspecified: Secondary | ICD-10-CM | POA: Diagnosis not present

## 2021-09-01 DIAGNOSIS — Y848 Other medical procedures as the cause of abnormal reaction of the patient, or of later complication, without mention of misadventure at the time of the procedure: Secondary | ICD-10-CM | POA: Diagnosis present

## 2021-09-01 DIAGNOSIS — N9989 Other postprocedural complications and disorders of genitourinary system: Secondary | ICD-10-CM | POA: Diagnosis not present

## 2021-09-01 DIAGNOSIS — M85851 Other specified disorders of bone density and structure, right thigh: Secondary | ICD-10-CM | POA: Diagnosis not present

## 2021-09-01 DIAGNOSIS — N1831 Chronic kidney disease, stage 3a: Secondary | ICD-10-CM | POA: Diagnosis present

## 2021-09-01 DIAGNOSIS — G4733 Obstructive sleep apnea (adult) (pediatric): Secondary | ICD-10-CM | POA: Diagnosis not present

## 2021-09-01 DIAGNOSIS — I700 Atherosclerosis of aorta: Secondary | ICD-10-CM | POA: Diagnosis not present

## 2021-09-01 DIAGNOSIS — N179 Acute kidney failure, unspecified: Secondary | ICD-10-CM | POA: Diagnosis not present

## 2021-09-01 DIAGNOSIS — I110 Hypertensive heart disease with heart failure: Secondary | ICD-10-CM | POA: Diagnosis not present

## 2021-09-01 DIAGNOSIS — Z20822 Contact with and (suspected) exposure to covid-19: Secondary | ICD-10-CM | POA: Diagnosis not present

## 2021-09-01 DIAGNOSIS — E1140 Type 2 diabetes mellitus with diabetic neuropathy, unspecified: Secondary | ICD-10-CM | POA: Diagnosis not present

## 2021-09-01 DIAGNOSIS — G7241 Inclusion body myositis [IBM]: Secondary | ICD-10-CM | POA: Diagnosis not present

## 2021-09-01 DIAGNOSIS — J9811 Atelectasis: Secondary | ICD-10-CM | POA: Diagnosis present

## 2021-09-01 DIAGNOSIS — I130 Hypertensive heart and chronic kidney disease with heart failure and stage 1 through stage 4 chronic kidney disease, or unspecified chronic kidney disease: Secondary | ICD-10-CM | POA: Diagnosis not present

## 2021-09-01 DIAGNOSIS — E1122 Type 2 diabetes mellitus with diabetic chronic kidney disease: Secondary | ICD-10-CM | POA: Diagnosis not present

## 2021-09-01 DIAGNOSIS — N400 Enlarged prostate without lower urinary tract symptoms: Secondary | ICD-10-CM | POA: Diagnosis not present

## 2021-09-01 DIAGNOSIS — Z1611 Resistance to penicillins: Secondary | ICD-10-CM | POA: Diagnosis not present

## 2021-09-01 DIAGNOSIS — I10 Essential (primary) hypertension: Secondary | ICD-10-CM | POA: Diagnosis present

## 2021-09-01 DIAGNOSIS — Z8249 Family history of ischemic heart disease and other diseases of the circulatory system: Secondary | ICD-10-CM | POA: Diagnosis not present

## 2021-09-01 DIAGNOSIS — R278 Other lack of coordination: Secondary | ICD-10-CM | POA: Diagnosis not present

## 2021-09-01 DIAGNOSIS — K219 Gastro-esophageal reflux disease without esophagitis: Secondary | ICD-10-CM | POA: Diagnosis not present

## 2021-09-01 DIAGNOSIS — Z8042 Family history of malignant neoplasm of prostate: Secondary | ICD-10-CM

## 2021-09-01 DIAGNOSIS — Z833 Family history of diabetes mellitus: Secondary | ICD-10-CM

## 2021-09-01 DIAGNOSIS — H409 Unspecified glaucoma: Secondary | ICD-10-CM | POA: Diagnosis not present

## 2021-09-01 DIAGNOSIS — M62561 Muscle wasting and atrophy, not elsewhere classified, right lower leg: Secondary | ICD-10-CM | POA: Diagnosis not present

## 2021-09-01 DIAGNOSIS — B9620 Unspecified Escherichia coli [E. coli] as the cause of diseases classified elsewhere: Secondary | ICD-10-CM | POA: Diagnosis present

## 2021-09-01 DIAGNOSIS — Z713 Dietary counseling and surveillance: Secondary | ICD-10-CM

## 2021-09-01 DIAGNOSIS — Z741 Need for assistance with personal care: Secondary | ICD-10-CM | POA: Diagnosis not present

## 2021-09-01 DIAGNOSIS — Z7401 Bed confinement status: Secondary | ICD-10-CM | POA: Diagnosis not present

## 2021-09-01 LAB — PROCALCITONIN: Procalcitonin: 4.1 ng/mL

## 2021-09-01 LAB — URINALYSIS, ROUTINE W REFLEX MICROSCOPIC
Bilirubin Urine: NEGATIVE
Glucose, UA: NEGATIVE mg/dL
Ketones, ur: NEGATIVE mg/dL
Nitrite: NEGATIVE
Protein, ur: NEGATIVE mg/dL
Specific Gravity, Urine: 1.011 (ref 1.005–1.030)
pH: 6 (ref 5.0–8.0)

## 2021-09-01 LAB — TROPONIN I (HIGH SENSITIVITY)
Troponin I (High Sensitivity): 28 ng/L — ABNORMAL HIGH (ref <18)
Troponin I (High Sensitivity): 35 ng/L — ABNORMAL HIGH (ref <18)
Troponin I (High Sensitivity): 40 ng/L — ABNORMAL HIGH (ref <18)

## 2021-09-01 LAB — BASIC METABOLIC PANEL
Anion gap: 11 (ref 5–15)
BUN: 23 mg/dL (ref 8–23)
CO2: 25 mmol/L (ref 22–32)
Calcium: 9.1 mg/dL (ref 8.9–10.3)
Chloride: 101 mmol/L (ref 98–111)
Creatinine, Ser: 1.7 mg/dL — ABNORMAL HIGH (ref 0.61–1.24)
GFR, Estimated: 41 mL/min — ABNORMAL LOW (ref >60)
Glucose, Bld: 176 mg/dL — ABNORMAL HIGH (ref 70–99)
Potassium: 4.1 mmol/L (ref 3.5–5.1)
Sodium: 137 mmol/L (ref 135–145)

## 2021-09-01 LAB — CBC WITH DIFFERENTIAL/PLATELET
Abs Immature Granulocytes: 0.48 10*3/uL — ABNORMAL HIGH (ref 0.00–0.07)
Basophils Absolute: 0.1 10*3/uL (ref 0.0–0.1)
Basophils Relative: 0 %
Eosinophils Absolute: 0 10*3/uL (ref 0.0–0.5)
Eosinophils Relative: 0 %
HCT: 46.5 % (ref 39.0–52.0)
Hemoglobin: 15.5 g/dL (ref 13.0–17.0)
Immature Granulocytes: 2 %
Lymphocytes Relative: 7 %
Lymphs Abs: 1.9 10*3/uL (ref 0.7–4.0)
MCH: 31.3 pg (ref 26.0–34.0)
MCHC: 33.3 g/dL (ref 30.0–36.0)
MCV: 93.9 fL (ref 80.0–100.0)
Monocytes Absolute: 2.9 10*3/uL — ABNORMAL HIGH (ref 0.1–1.0)
Monocytes Relative: 11 %
Neutro Abs: 21.9 10*3/uL — ABNORMAL HIGH (ref 1.7–7.7)
Neutrophils Relative %: 80 %
Platelets: 197 10*3/uL (ref 150–400)
RBC: 4.95 MIL/uL (ref 4.22–5.81)
RDW: 14.3 % (ref 11.5–15.5)
WBC: 27.2 10*3/uL — ABNORMAL HIGH (ref 4.0–10.5)
nRBC: 0 % (ref 0.0–0.2)

## 2021-09-01 LAB — RESP PANEL BY RT-PCR (FLU A&B, COVID) ARPGX2
Influenza A by PCR: NEGATIVE
Influenza B by PCR: NEGATIVE
SARS Coronavirus 2 by RT PCR: NEGATIVE

## 2021-09-01 LAB — CBG MONITORING, ED
Glucose-Capillary: 193 mg/dL — ABNORMAL HIGH (ref 70–99)
Glucose-Capillary: 129 mg/dL — ABNORMAL HIGH (ref 70–99)

## 2021-09-01 LAB — HEPATIC FUNCTION PANEL
ALT: 28 U/L (ref 0–44)
AST: 36 U/L (ref 15–41)
Albumin: 3.5 g/dL (ref 3.5–5.0)
Alkaline Phosphatase: 109 U/L (ref 38–126)
Bilirubin, Direct: 0.3 mg/dL — ABNORMAL HIGH (ref 0.0–0.2)
Indirect Bilirubin: 0.9 mg/dL (ref 0.3–0.9)
Total Bilirubin: 1.2 mg/dL (ref 0.3–1.2)
Total Protein: 7.4 g/dL (ref 6.5–8.1)

## 2021-09-01 LAB — CBC
HCT: 46.1 % (ref 39.0–52.0)
Hemoglobin: 15.6 g/dL (ref 13.0–17.0)
MCH: 31.5 pg (ref 26.0–34.0)
MCHC: 33.8 g/dL (ref 30.0–36.0)
MCV: 93.1 fL (ref 80.0–100.0)
Platelets: 196 10*3/uL (ref 150–400)
RBC: 4.95 MIL/uL (ref 4.22–5.81)
RDW: 14.3 % (ref 11.5–15.5)
WBC: 23.3 10*3/uL — ABNORMAL HIGH (ref 4.0–10.5)
nRBC: 0 % (ref 0.0–0.2)

## 2021-09-01 LAB — LACTIC ACID, PLASMA
Lactic Acid, Venous: 2.1 mmol/L (ref 0.5–1.9)
Lactic Acid, Venous: 3.8 mmol/L (ref 0.5–1.9)
Lactic Acid, Venous: 1.9 mmol/L (ref 0.5–1.9)

## 2021-09-01 LAB — BRAIN NATRIURETIC PEPTIDE: B Natriuretic Peptide: 50.1 pg/mL (ref 0.0–100.0)

## 2021-09-01 MED ORDER — SODIUM CHLORIDE 0.9 % IV SOLN
2.0000 g | INTRAVENOUS | Status: DC
  Administered 2021-09-02 – 2021-09-05 (×4): 2 g via INTRAVENOUS
  Filled 2021-09-01 (×4): qty 20

## 2021-09-01 MED ORDER — ACETAMINOPHEN 650 MG RE SUPP: 650.0000 mg | Freq: Four times a day (QID) | RECTAL | Status: DC | PRN

## 2021-09-01 MED ORDER — VANCOMYCIN HCL IN DEXTROSE 1-5 GM/200ML-% IV SOLN: 1000.0000 mg | Freq: Once | INTRAVENOUS | Status: DC

## 2021-09-01 MED ORDER — INSULIN ASPART 100 UNIT/ML IJ SOLN
0.0000 [IU] | Freq: Three times a day (TID) | INTRAMUSCULAR | Status: DC
  Administered 2021-09-02: 2 [IU] via SUBCUTANEOUS
  Administered 2021-09-02 – 2021-09-03 (×3): 3 [IU] via SUBCUTANEOUS
  Administered 2021-09-03: 2 [IU] via SUBCUTANEOUS
  Administered 2021-09-04: 3 [IU] via SUBCUTANEOUS
  Administered 2021-09-04: 5 [IU] via SUBCUTANEOUS
  Administered 2021-09-04: 9 [IU] via SUBCUTANEOUS
  Administered 2021-09-05: 3 [IU] via SUBCUTANEOUS
  Administered 2021-09-05 – 2021-09-07 (×4): 2 [IU] via SUBCUTANEOUS

## 2021-09-01 MED ORDER — IOHEXOL 350 MG/ML SOLN
75.0000 mL | Freq: Once | INTRAVENOUS | Status: AC | PRN
  Administered 2021-09-01: 75 mL via INTRAVENOUS

## 2021-09-01 MED ORDER — VANCOMYCIN HCL IN DEXTROSE 1-5 GM/200ML-% IV SOLN
1000.0000 mg | INTRAVENOUS | Status: DC
  Administered 2021-09-02 – 2021-09-04 (×3): 1000 mg via INTRAVENOUS
  Filled 2021-09-01 (×3): qty 200

## 2021-09-01 MED ORDER — INSULIN ASPART 100 UNIT/ML IJ SOLN
0.0000 [IU] | Freq: Every day | INTRAMUSCULAR | Status: DC
  Administered 2021-09-02 – 2021-09-04 (×3): 2 [IU] via SUBCUTANEOUS

## 2021-09-01 MED ORDER — SODIUM CHLORIDE 0.9 % IV SOLN
500.0000 mg | Freq: Once | INTRAVENOUS | Status: AC
  Administered 2021-09-01: 500 mg via INTRAVENOUS
  Filled 2021-09-01: qty 5

## 2021-09-01 MED ORDER — HYDROCOD POLI-CHLORPHE POLI ER 10-8 MG/5ML PO SUER
5.0000 mL | Freq: Once | ORAL | Status: AC
  Administered 2021-09-01: 5 mL via ORAL
  Filled 2021-09-01: qty 5

## 2021-09-01 MED ORDER — ACETAMINOPHEN 325 MG PO TABS
650.0000 mg | ORAL_TABLET | Freq: Four times a day (QID) | ORAL | Status: DC | PRN
  Administered 2021-09-02 – 2021-09-03 (×3): 650 mg via ORAL
  Filled 2021-09-01 (×3): qty 2

## 2021-09-01 MED ORDER — SODIUM CHLORIDE 0.9% FLUSH
3.0000 mL | Freq: Two times a day (BID) | INTRAVENOUS | Status: DC
  Administered 2021-09-02 – 2021-09-05 (×7): 3 mL via INTRAVENOUS

## 2021-09-01 MED ORDER — VANCOMYCIN HCL 10 G IV SOLR
2500.0000 mg | Freq: Once | INTRAVENOUS | Status: AC
  Administered 2021-09-01: 2500 mg via INTRAVENOUS
  Filled 2021-09-01: qty 2500

## 2021-09-01 MED ORDER — SODIUM CHLORIDE 0.9 % IV SOLN
2.0000 g | Freq: Once | INTRAVENOUS | Status: AC
  Administered 2021-09-01: 2 g via INTRAVENOUS
  Filled 2021-09-01: qty 20

## 2021-09-01 MED ORDER — ONDANSETRON HCL 4 MG PO TABS: 4.0000 mg | ORAL_TABLET | Freq: Four times a day (QID) | ORAL | Status: DC | PRN

## 2021-09-01 MED ORDER — ONDANSETRON HCL 4 MG/2ML IJ SOLN: 4.0000 mg | Freq: Four times a day (QID) | INTRAMUSCULAR | Status: DC | PRN

## 2021-09-01 MED ORDER — LACTATED RINGERS IV BOLUS (SEPSIS)
1000.0000 mL | Freq: Once | INTRAVENOUS | Status: AC
  Administered 2021-09-01: 1000 mL via INTRAVENOUS

## 2021-09-01 MED ORDER — ENOXAPARIN SODIUM 40 MG/0.4ML IJ SOSY
40.0000 mg | PREFILLED_SYRINGE | INTRAMUSCULAR | Status: DC
  Administered 2021-09-01 – 2021-09-06 (×6): 40 mg via SUBCUTANEOUS
  Filled 2021-09-01 (×6): qty 0.4

## 2021-09-01 MED ORDER — ACETAMINOPHEN 500 MG PO TABS
1000.0000 mg | ORAL_TABLET | Freq: Once | ORAL | Status: AC
  Administered 2021-09-01: 1000 mg via ORAL
  Filled 2021-09-01: qty 2

## 2021-09-01 MED ORDER — IPRATROPIUM-ALBUTEROL 0.5-2.5 (3) MG/3ML IN SOLN
3.0000 mL | Freq: Once | RESPIRATORY_TRACT | Status: AC
Start: 2021-09-01 — End: 2021-09-01
  Administered 2021-09-01: 3 mL via RESPIRATORY_TRACT
  Filled 2021-09-01: qty 3

## 2021-09-01 NOTE — Progress Notes (Signed)
Pharmacy Antibiotic Note ? ?Andrew Larsen is a 80 y.o. male admitted on 09/01/2021 with cellulitis.  Pharmacy has been consulted for Vancomycin dosing. ? ?Plan: ?Vancomycin 2500 mg IV x 1, followed by 1000 mg IV q24h (eAUC 452.8, SCr 1.7, goal AUC 400-550) ?Ceftriaxone 2 g IV q24h  ?Follow-up clinical status, renal function ?Follow-up cultures, LOT, narrow as able  ? ?Height: '5\' 11"'$  (180.3 cm) ?Weight: 122.5 kg (270 lb) ?IBW/kg (Calculated) : 75.3 ? ?Temp (24hrs), Avg:101.5 ?F (38.6 ?C), Min:100.7 ?F (38.2 ?C), Max:102.2 ?F (39 ?C) ? ?Recent Labs  ?Lab 09/01/21 ?1236 09/01/21 ?1511 09/01/21 ?1555 09/01/21 ?1726  ?WBC 23.3* 27.2*  --   --   ?CREATININE 1.70*  --   --   --   ?LATICACIDVEN  --   --  2.1* 3.8*  ?  ?Estimated Creatinine Clearance: 47.7 mL/min (A) (by C-G formula based on SCr of 1.7 mg/dL (H)).   ? ?Allergies  ?Allergen Reactions  ? Metformin And Related   ? Simbrinza [Brinzolamide-Brimonidine] Other (See Comments)  ?  Drainage, redness to eyes  ? ? ?Antimicrobials this admission: ?Vancomycin 5/5 >>  ?Ceftriaxone 5/5 >> ?Azithromycin 5/5  ? ?Microbiology results: ?5/5 BCx: pending ?5/5 UCx: pending  ? ?Thank you for allowing pharmacy to be a part of this patient?s care. ? ?Vance Peper, PharmD ?PGY1 Pharmacy Resident ?09/01/2021 9:37 PM  ? ?Please check AMION for all [facility] phone numbers ?After 10:00 PM, call [facility] [phone] ? ? ?

## 2021-09-01 NOTE — ED Triage Notes (Signed)
Patient here with complaint of shortness of breath and increased bilateral lower extremity weakness that started this morning. Patient also reports cough and mild lower extremity edema. Patient is alert, oriented, speaking in complete sentences, and is in no apparent distress at this time. ?

## 2021-09-01 NOTE — ED Provider Notes (Signed)
?[facility] ?Provider Note ? ? ?CSN: [id number] ?Arrival date & time: 09/01/21  1211 ? ?  ? ?History ? ?Chief Complaint  ?Patient presents with  ? Shortness of Breath  ? ? ?Andrew Larsen is a 81 y.o. male.  Presenting to ER due to concern for cough, shortness of breath.  Has been having shortness of breath for the past couple days.  States that he had recently increased the dose of his Lasix due to concern for CHF exacerbation.  States that his legs currently do not feel significantly swollen.  He does feel more short of breath than normal however.  Cough is nonproductive mostly but occasionally has some white to clear sputum.  No blood.  He did not note any chills or fevers at home but did have fever in triage. ? ?HPI ? ?  ? ?Home Medications ?Prior to Admission medications   ?Medication Sig Start Date End Date Taking? Authorizing Provider  ?albuterol (VENTOLIN HFA) 108 (90 Base) MCG/ACT inhaler Inhale 2 puffs into the lungs every 4 (four) hours as needed for wheezing or shortness of breath. 01/07/19   Lovorn, Jinny Blossom, MD  ?aspirin 81 MG chewable tablet Chew 81 mg by mouth at bedtime.     [provider]  ?calcium carbonate (CALCIUM 600) 600 MG TABS tablet Take 2 tablets (1,200 mg total) by mouth daily. 12/15/18   Angiulli, Lavon Paganini, PA-C  ?Cholecalciferol (VITAMIN D3) 5000 units TABS Take 10,000 Units by mouth daily.     [provider]  ?ezetimibe (ZETIA) 10 MG tablet TAKE 1 TABLET(10 MG) BY MOUTH DAILY AFTER SUPPER 07/27/21   Cantwell, Celeste C, PA-C  ?famotidine (PEPCID) 20 MG tablet Take 1 tablet (20 mg total) by mouth at bedtime. 12/15/18   Angiulli, Lavon Paganini, PA-C  ?folic acid (FOLVITE) 1 MG tablet Take 1 tablet (1 mg total) by mouth daily. 12/16/18   Angiulli, Lavon Paganini, PA-C  ?furosemide (LASIX) 40 MG tablet Take 1 tablet (40 mg total) by mouth daily. 12/15/18   Angiulli, Lavon Paganini, PA-C  ?gabapentin (NEURONTIN) 300 MG capsule Take 2 capsules (600 mg total) by  mouth 3 (three) times daily. 12/15/18   Angiulli, Lavon Paganini, PA-C  ?insulin aspart protamine - aspart (NOVOLOG MIX 70/30 FLEXPEN) (70-30) 100 UNIT/ML FlexPen Inject 0.36 mLs (36 Units total) into the skin daily with breakfast. ?Patient taking differently: Inject into the skin daily with breakfast. 60 units in AM and 50 units at night 12/15/18   Angiulli, Lavon Paganini, PA-C  ?latanoprost (XALATAN) 0.005 % ophthalmic solution Place 1 drop into both eyes at bedtime.    [provider]  ?losartan (COZAAR) 25 MG tablet TAKE 1 TABLET(25 MG) BY MOUTH DAILY 10/05/19   Adrian Prows, MD  ?magnesium gluconate (MAGONATE) 500 MG tablet Take 500 mg by mouth daily.    [provider]  ?metoprolol tartrate (LOPRESSOR) 25 MG tablet Take 1 tablet (25 mg total) by mouth 2 (two) times daily. 12/15/18   Angiulli, Lavon Paganini, PA-C  ?Multiple Vitamin (MULTIVITAMIN) capsule Take 1 capsule by mouth daily.     [provider]  ?oxybutynin (DITROPAN) 5 MG tablet Take 1 tablet (5 mg total) by mouth 2 (two) times daily. 12/15/18   Angiulli, Lavon Paganini, PA-C  ?pantoprazole (PROTONIX) 40 MG tablet TAKE 1 TABLET(40 MG) BY MOUTH DAILY BEFORE BREAKFAST AS NEEDED FOR COUGH OR ACID REFLUX 08/23/21   Adrian Prows, MD  ?potassium chloride SA (K-DUR) 20 MEQ tablet Take 1 tablet (  20 mEq total) by mouth daily. 12/15/18   Angiulli, Lavon Paganini, PA-C  ?rosuvastatin (CRESTOR) 10 MG tablet Take 1 tablet (10 mg total) by mouth once a week. 11/09/19   Adrian Prows, MD  ?tamsulosin (FLOMAX) 0.4 MG CAPS capsule Take 0.4 mg by mouth daily.    [provider]  ?timolol (TIMOPTIC) 0.5 % ophthalmic solution INT 1 GTT IN [facility] EYE BID 01/30/19   [provider]  ?zinc sulfate 220 (50 Zn) MG capsule Take 1 capsule (220 mg total) by mouth daily. 12/16/18   Angiulli, Lavon Paganini, PA-C  ?   ? ?Allergies    ?Metformin and related and Simbrinza [brinzolamide-brimonidine]   ? ?Review of Systems   ?Review of Systems  ?Constitutional:  Positive for fatigue. Negative  for chills and fever.  ?HENT:  Negative for ear pain and sore throat.   ?Eyes:  Negative for pain and visual disturbance.  ?Respiratory:  Positive for cough and shortness of breath.   ?Cardiovascular:  Negative for chest pain and palpitations.  ?Gastrointestinal:  Negative for abdominal pain and vomiting.  ?Genitourinary:  Negative for dysuria and hematuria.  ?Musculoskeletal:  Negative for arthralgias and back pain.  ?Skin:  Negative for color change and rash.  ?Neurological:  Negative for seizures and syncope.  ?All other systems reviewed and are negative. ? ?Physical Exam ?Updated Vital Signs ?BP (!) 128/57   Pulse (!) 102   Temp (!) 100.7 ?F (38.2 ?C) (Oral)   Resp (!) 25   SpO2 100%  ?Physical Exam ?Vitals and nursing note reviewed.  ?Constitutional:   ?   General: He is not in acute distress. ?   Appearance: He is well-developed.  ?HENT:  ?   Head: Normocephalic and atraumatic.  ?Eyes:  ?   Conjunctiva/sclera: Conjunctivae normal.  ?Cardiovascular:  ?   Rate and Rhythm: Normal rate and regular rhythm.  ?   Heart sounds: No murmur heard. ?Pulmonary:  ?   Effort: Pulmonary effort is normal.  ?   Breath sounds: Normal breath sounds.  ?   Comments: Slight end expiratory wheeze noted, frequent cough, slight tachypnea but no distress ?Abdominal:  ?   Palpations: Abdomen is soft.  ?   Tenderness: There is no abdominal tenderness.  ?Musculoskeletal:     ?   General: No swelling.  ?   Cervical back: Neck supple.  ?Skin: ?   General: Skin is warm and dry.  ?   Capillary Refill: Capillary refill takes less than 2 seconds.  ?Neurological:  ?   Mental Status: He is alert.  ?Psychiatric:     ?   Mood and Affect: Mood normal.  ? ? ?ED Results / Procedures / Treatments   ?Labs ?(all labs ordered are listed, but only abnormal results are displayed) ?Labs Reviewed  ?BASIC METABOLIC PANEL - Abnormal; Notable for the following components:  ?    Result Value  ? Glucose, Bld 176 (*)   ? Creatinine, Ser 1.70 (*)   ? GFR,  Estimated 41 (*)   ? All other components within normal limits  ?CBC - Abnormal; Notable for the following components:  ? WBC 23.3 (*)   ? All other components within normal limits  ?LACTIC ACID, PLASMA - Abnormal; Notable for the following components:  ? Lactic Acid, Venous 2.1 (*)   ? All other components within normal limits  ?HEPATIC FUNCTION PANEL - Abnormal; Notable for the following components:  ? Bilirubin, Direct 0.3 (*)   ? All other  components within normal limits  ?CBC WITH DIFFERENTIAL/PLATELET - Abnormal; Notable for the following components:  ? WBC 27.2 (*)   ? Neutro Abs 21.9 (*)   ? Monocytes Absolute 2.9 (*)   ? Abs Immature Granulocytes 0.48 (*)   ? All other components within normal limits  ?CBG MONITORING, ED - Abnormal; Notable for the following components:  ? Glucose-Capillary 193 (*)   ? All other components within normal limits  ?TROPONIN I (HIGH SENSITIVITY) - Abnormal; Notable for the following components:  ? Troponin I (High Sensitivity) 28 (*)   ? All other components within normal limits  ?CULTURE, BLOOD (ROUTINE X 2)  ?CULTURE, BLOOD (ROUTINE X 2)  ?RESP PANEL BY RT-PCR (FLU A&B, COVID) ARPGX2  ?BRAIN NATRIURETIC PEPTIDE  ?LACTIC ACID, PLASMA  ?URINALYSIS, ROUTINE W REFLEX MICROSCOPIC  ?TROPONIN I (HIGH SENSITIVITY)  ? ? ?EKG ?None ? ?Radiology ?DG Chest 2 View ? ?Result Date: 09/01/2021 ?CLINICAL DATA:  Dyspnea. Fatigue and weakness in the lower extremities. Shortness of breath for 2 days. EXAM: CHEST - 2 VIEW COMPARISON:  Chest two views 12/08/2018, 12/04/2018, AP chest 11/28/2018 FINDINGS: Cardiac silhouette again is moderately enlarged. Mediastinal contours are grossly unchanged from prior and within normal limits. Moderate calcification within the aortic arch. Moderate bilateral chronic interstitial thickening consistent with interstitial lung disease. Improved aeration of the right lung base compared to 12/08/2018. No definite focal airspace opacity to indicate acute pneumonia. No  definite pleural effusion. No pneumothorax. Mild-to-moderate multilevel degenerative disc changes of the thoracic spine. IMPRESSION: Chronic moderate bilateral interstitial thickening/interstitial lung disease

## 2021-09-01 NOTE — ED Provider Triage Note (Signed)
Emergency Medicine Provider Triage Evaluation Note ? ?Andrew Larsen , a 81 y.o. male  was evaluated in triage.  Pt complains of shortness of breath for the last 2 to 3 days.  Patient has history of heart failure, states that he recently had CHF exacerbation.  The patient reports that he recently increased the dose of Lasix to 40 mg in the morning, 40 mg at night.  Patient states he has been compliant on this.  Patient denies any lower extremity swelling beyond his baseline, states he feels like his abdomen is slightly distended.  Patient denies any two-pillow orthopnea.  Denies any recent fevers, nausea or vomiting. ? ?Review of Systems  ?Positive:  ?Negative:  ? ?Physical Exam  ?BP 134/83 (BP Location: Left Arm)   Pulse (!) 113   Temp (!) 102.2 ?F (39 ?C) (Oral)   Resp 14   SpO2 94%  ?Gen:   Awake, no distress   ?Resp:  Normal effort  ?MSK:   Moves extremities without difficulty  ?Other:   ? ?Medical Decision Making  ?Medically screening exam initiated at 12:45 PM.  Appropriate orders placed.  Andrew Larsen was informed that the remainder of the evaluation will be completed by another provider, this initial triage assessment does not replace that evaluation, and the importance of remaining in the ED until their evaluation is complete. ? ? ?  ?Azucena Cecil, PA-C ?09/01/21 1246 ? ?

## 2021-09-01 NOTE — H&P (Signed)
?History and Physical  ? ? ?CASTLE LAMONS [place]:[id number] DOB: [birth year] DOA: 09/01/2021 ? ?PCP: Velna Hatchet, MD  ? ?Patient coming from: Home  ? ?Chief Complaint: Fatigue, cough  ? ?HPI: Andrew Larsen is a pleasant 81 y.o. male with medical history significant for hypertension, type 2 diabetes mellitus, OSA on CPAP, PAD, and BMI 38, presenting to the emergency department with fatigue, lethargy, and cough.  Patient reports that he developed a mild cough yesterday and then severe fatigue last night.  Since last night, he has had difficulty performing his basic ADLs due to severe fatigue.  He reports some purulent drainage from the posterior aspect of his right thigh, noting that he has had a boil there for years that flares up occasionally as it is now.  He denies abdominal pain, vomiting, or diarrhea.  He had a prostate biopsy on 08/30/2021 and took a prophylactic antibiotic for that.  He denies dysuria or flank pain. ? ?ED Course: Upon arrival to the ED, patient is found to be febrile to 39C, saturating well on room air, and tachycardic with stable blood pressure.  EKG features sinus tachycardia.  Chest x-ray with chronic interstitial thickening.  CTA chest negative for PE or other acute finding.  Chemistry panel notable for creatinine 1.7.  CBC features a leukocytosis to 27,200.  Lactic acid increased from 2.1-3.8.  Troponin was mildly elevated.  Blood cultures were collected and the patient was treated with acetaminophen, Rocephin, and azithromycin. ? ?Review of Systems:  ?All other systems reviewed and apart from HPI, are negative. ? ?Past Medical History:  ?Diagnosis Date  ? CKD (chronic kidney disease)   ? CKD stage III (01/2018)  ? Daytime somnolence   ? Diabetes mellitus without complication ([place])   ? Fracture   ? right fibula/wears boot cast  ? GERD (gastroesophageal reflux disease)   ? Glaucoma   ? Hypercholesteremia   ? Hyperlipidemia   ? Hypertension   ? Mold exposure   ? PAD (peripheral artery  disease) ([place])   ? Sleep apnea   ? ? ?Past Surgical History:  ?Procedure Laterality Date  ? LOWER EXTREMITY ANGIOGRAPHY N/A 06/12/2016  ? Procedure: Lower Extremity Angiography;  Surgeon: Adrian Prows, MD;  Location: [facility];  Service: Cardiovascular;  Laterality: N/A;  ? MUSCLE BIOPSY Right 05/06/2018  ? Procedure: right vastus lateralis muscle biopsy;  Surgeon: Earnie Larsson, MD;  Location: [facility];  Service: Neurosurgery;  Laterality: Right;  ? ORIF ANKLE FRACTURE Right 10/02/2012  ? Procedure: OPEN REDUCTION INTERNAL FIXATION (ORIF) ANKLE FRACTURE;  Surgeon: Meredith Pel, MD;  Location: [facility];  Service: Orthopedics;  Laterality: Right;  ? PERIPHERAL VASCULAR ATHERECTOMY Left 06/12/2016  ? Procedure: Peripheral Vascular Atherectomy-Left Popliteal;  Surgeon: Adrian Prows, MD;  Location: [facility];  Service: Cardiovascular;  Laterality: Left;  ? PERIPHERAL VASCULAR INTERVENTION Left 06/12/2016  ? Procedure: Peripheral Vascular Intervention- DCB Left Popliteal;  Surgeon: Adrian Prows, MD;  Location: [facility];  Service: Cardiovascular;  Laterality: Left;  ? ? ?Social History:  ? reports that he quit smoking about 40 years ago. His smoking use included cigarettes. He has a 20.00 pack-year smoking history. He has never used smokeless tobacco. He reports that he does not drink alcohol and does not use drugs. ? ?Allergies  ?Allergen Reactions  ? Metformin And Related   ? Simbrinza [Brinzolamide-Brimonidine] Other (See Comments)  ?  Drainage, redness to eyes  ? ? ?Family History  ?Problem Relation Age  of Onset  ? Heart disease Father   ? Prostate cancer Brother   ? Cancer Brother   ? Diabetes Brother   ? Colon cancer Neg Hx   ? ? ? ?Prior to Admission medications   ?Medication Sig Start Date End Date Taking? Authorizing Provider  ?albuterol (VENTOLIN HFA) 108 (90 Base) MCG/ACT inhaler Inhale 2 puffs into the lungs every 4 (four) hours as needed for wheezing or shortness of breath. 01/07/19  Yes Lovorn,  Jinny Blossom, MD  ?aspirin 81 MG chewable tablet Chew 81 mg by mouth at bedtime.    Yes [provider]  ?calcium carbonate (CALCIUM 600) 600 MG TABS tablet Take 2 tablets (1,200 mg total) by mouth daily. 12/15/18  Yes Angiulli, Lavon Paganini, PA-C  ?Cholecalciferol (VITAMIN D3) 5000 units TABS Take 10,000 Units by mouth daily.    Yes [provider]  ?ezetimibe (ZETIA) 10 MG tablet TAKE 1 TABLET(10 MG) BY MOUTH DAILY AFTER SUPPER 07/27/21  Yes Cantwell, Celeste C, PA-C  ?famotidine (PEPCID) 20 MG tablet Take 1 tablet (20 mg total) by mouth at bedtime. 12/15/18  Yes Angiulli, Lavon Paganini, PA-C  ?folic acid (FOLVITE) 1 MG tablet Take 1 tablet (1 mg total) by mouth daily. 12/16/18  Yes Angiulli, Lavon Paganini, PA-C  ?furosemide (LASIX) 40 MG tablet Take 1 tablet (40 mg total) by mouth daily. ?Patient taking differently: Take 40 mg by mouth 2 (two) times daily. 12/15/18  Yes Angiulli, Lavon Paganini, PA-C  ?gabapentin (NEURONTIN) 300 MG capsule Take 2 capsules (600 mg total) by mouth 3 (three) times daily. ?Patient taking differently: Take 900 mg by mouth 3 (three) times daily. 12/15/18  Yes Angiulli, Lavon Paganini, PA-C  ?insulin aspart protamine - aspart (NOVOLOG MIX 70/30 FLEXPEN) (70-30) 100 UNIT/ML FlexPen Inject 0.36 mLs (36 Units total) into the skin daily with breakfast. ?Patient taking differently: Inject 60 Units into the skin in the morning and at bedtime. 12/15/18  Yes Angiulli, Lavon Paganini, PA-C  ?losartan (COZAAR) 25 MG tablet TAKE 1 TABLET(25 MG) BY MOUTH DAILY ?Patient taking differently: Take 25 mg by mouth daily. 10/05/19  Yes Adrian Prows, MD  ?Multiple Vitamin (MULTIVITAMIN) capsule Take 1 capsule by mouth daily.    Yes [provider]  ?tamsulosin (FLOMAX) 0.4 MG CAPS capsule Take 0.4 mg by mouth daily.   Yes [provider]  ?timolol (TIMOPTIC) 0.5 % ophthalmic solution Place 1 drop into the right eye 2 (two) times daily. 01/30/19  Yes [provider]  ?zinc sulfate 220 (50 Zn) MG capsule Take 1  capsule (220 mg total) by mouth daily. 12/16/18  Yes Angiulli, Lavon Paganini, PA-C  ?magnesium gluconate (MAGONATE) 500 MG tablet Take 500 mg by mouth daily.    [provider]  ?metoprolol tartrate (LOPRESSOR) 25 MG tablet Take 1 tablet (25 mg total) by mouth 2 (two) times daily. 12/15/18   Angiulli, Lavon Paganini, PA-C  ?oxybutynin (DITROPAN) 5 MG tablet Take 1 tablet (5 mg total) by mouth 2 (two) times daily. ?Patient not taking: Reported on 09/01/2021 12/15/18   Angiulli, Lavon Paganini, PA-C  ?pantoprazole (PROTONIX) 40 MG tablet TAKE 1 TABLET(40 MG) BY MOUTH DAILY BEFORE BREAKFAST AS NEEDED FOR COUGH OR ACID REFLUX ?Patient taking differently: Take 40 mg by mouth See admin instructions. 08/23/21   Adrian Prows, MD  ?potassium chloride SA (K-DUR) 20 MEQ tablet Take 1 tablet (20 mEq total) by mouth daily. 12/15/18   Angiulli, Lavon Paganini, PA-C  ?rosuvastatin (CRESTOR) 10 MG tablet Take 1 tablet (10 mg  total) by mouth once a week. 11/09/19   Adrian Prows, MD  ? ? ?Physical Exam: ?Vitals:  ? 09/01/21 2000 09/01/21 2015 09/01/21 2030 09/01/21 2045  ?BP: (!) 123/47 106/62 128/70 (!) 146/72  ?Pulse: 97 (!) 101 (!) 102 (!) 103  ?Resp: '18 20 18 20  '$ ?Temp:      ?TempSrc:      ?SpO2: 93% 95% 93% 93%  ?Weight:      ?Height:      ? ? ?Constitutional: NAD, calm  ?Eyes: PERTLA, lids and conjunctivae normal ?ENMT: Mucous membranes are moist. Posterior pharynx clear of any exudate or lesions.   ?Neck: supple, no masses  ?Respiratory: no wheezing, no crackles. No accessory muscle use.  ?Cardiovascular: Rate ~120 and regular. No JVD.   ?Abdomen: No distension, no tenderness, soft. Bowel sounds active.  ?Musculoskeletal: no clubbing / cyanosis. No joint deformity upper and lower extremities.   ?Skin: Purulent drainage from posterior aspect right thigh without significant surrounding erythema or induration, and without crepitus. Warm, dry, well-perfused. ?Neurologic: CN 2-12 grossly intact. Moving all extremities. Alert and oriented.  ?Psychiatric:  Very pleasant. Cooperative.  ? ? ?Labs and Imaging on Admission: I have personally reviewed following labs and imaging studies ? ?CBC: ?Recent Labs  ?Lab 09/01/21 ?1236 09/01/21 ?1511  ?WBC 23.3* 27.2*  ?NEUTROABS

## 2021-09-01 NOTE — ED Notes (Signed)
Was off lasix for 1 week in preparation for prostate biopsy. Just resumed lasix yesterday 08/31/21. ?

## 2021-09-02 ENCOUNTER — Other Ambulatory Visit: Payer: Self-pay

## 2021-09-02 DIAGNOSIS — R6520 Severe sepsis without septic shock: Secondary | ICD-10-CM

## 2021-09-02 DIAGNOSIS — A419 Sepsis, unspecified organism: Secondary | ICD-10-CM | POA: Diagnosis not present

## 2021-09-02 LAB — RESPIRATORY PANEL BY PCR

## 2021-09-02 LAB — BASIC METABOLIC PANEL
Anion gap: 12 (ref 5–15)
BUN: 23 mg/dL (ref 8–23)
CO2: 25 mmol/L (ref 22–32)
Calcium: 9.2 mg/dL (ref 8.9–10.3)
Chloride: 101 mmol/L (ref 98–111)
Creatinine, Ser: 1.82 mg/dL — ABNORMAL HIGH (ref 0.61–1.24)
GFR, Estimated: 38 mL/min — ABNORMAL LOW (ref >60)
Glucose, Bld: 123 mg/dL — ABNORMAL HIGH (ref 70–99)
Potassium: 4.9 mmol/L (ref 3.5–5.1)
Sodium: 138 mmol/L (ref 135–145)

## 2021-09-02 LAB — GLUCOSE, CAPILLARY
Glucose-Capillary: 185 mg/dL — ABNORMAL HIGH (ref 70–99)
Glucose-Capillary: 205 mg/dL — ABNORMAL HIGH (ref 70–99)
Glucose-Capillary: 222 mg/dL — ABNORMAL HIGH (ref 70–99)

## 2021-09-02 LAB — CBC
HCT: 47 % (ref 39.0–52.0)
Hemoglobin: 15.7 g/dL (ref 13.0–17.0)
MCH: 31.2 pg (ref 26.0–34.0)
MCHC: 33.4 g/dL (ref 30.0–36.0)
MCV: 93.4 fL (ref 80.0–100.0)
Platelets: 180 10*3/uL (ref 150–400)
RBC: 5.03 MIL/uL (ref 4.22–5.81)
RDW: 14.6 % (ref 11.5–15.5)
WBC: 25.4 10*3/uL — ABNORMAL HIGH (ref 4.0–10.5)
nRBC: 0 % (ref 0.0–0.2)

## 2021-09-02 LAB — LACTIC ACID, PLASMA: Lactic Acid, Venous: 3.4 mmol/L (ref 0.5–1.9)

## 2021-09-02 LAB — CBG MONITORING, ED: Glucose-Capillary: 115 mg/dL — ABNORMAL HIGH (ref 70–99)

## 2021-09-02 LAB — PROCALCITONIN: Procalcitonin: 5.31 ng/mL

## 2021-09-02 LAB — HEMOGLOBIN A1C
Hgb A1c MFr Bld: 8.4 % — ABNORMAL HIGH (ref 4.8–5.6)
Mean Plasma Glucose: 194.38 mg/dL

## 2021-09-02 MED ORDER — DORZOLAMIDE HCL 2 % OP SOLN
1.0000 [drp] | Freq: Two times a day (BID) | OPHTHALMIC | Status: DC
  Administered 2021-09-02 – 2021-09-07 (×10): 1 [drp] via OPHTHALMIC
  Filled 2021-09-02: qty 10

## 2021-09-02 MED ORDER — FOLIC ACID 1 MG PO TABS
1.0000 mg | ORAL_TABLET | Freq: Every day | ORAL | Status: DC
  Administered 2021-09-02 – 2021-09-07 (×6): 1 mg via ORAL
  Filled 2021-09-02 (×6): qty 1

## 2021-09-02 MED ORDER — ASPIRIN 81 MG PO CHEW
81.0000 mg | CHEWABLE_TABLET | Freq: Every day | ORAL | Status: DC
  Administered 2021-09-02 – 2021-09-06 (×5): 81 mg via ORAL
  Filled 2021-09-02 (×5): qty 1

## 2021-09-02 MED ORDER — SODIUM CHLORIDE 0.9 % IV SOLN: INTRAVENOUS | Status: DC

## 2021-09-02 MED ORDER — EZETIMIBE 10 MG PO TABS
10.0000 mg | ORAL_TABLET | Freq: Every day | ORAL | Status: DC
  Administered 2021-09-02 – 2021-09-06 (×5): 10 mg via ORAL
  Filled 2021-09-02 (×5): qty 1

## 2021-09-02 MED ORDER — INSULIN ASPART PROT & ASPART (70-30 MIX) 100 UNIT/ML ~~LOC~~ SUSP
36.0000 [IU] | Freq: Every day | SUBCUTANEOUS | Status: DC
  Administered 2021-09-03: 36 [IU] via SUBCUTANEOUS
  Filled 2021-09-02: qty 10

## 2021-09-02 MED ORDER — FAMOTIDINE 20 MG PO TABS
20.0000 mg | ORAL_TABLET | Freq: Every day | ORAL | Status: DC
  Administered 2021-09-02 – 2021-09-06 (×5): 20 mg via ORAL
  Filled 2021-09-02 (×5): qty 1

## 2021-09-02 MED ORDER — TAMSULOSIN HCL 0.4 MG PO CAPS
0.4000 mg | ORAL_CAPSULE | Freq: Every day | ORAL | Status: DC
Start: 2021-09-02 — End: 2021-09-07
  Administered 2021-09-02 – 2021-09-07 (×6): 0.4 mg via ORAL
  Filled 2021-09-02 (×6): qty 1

## 2021-09-02 MED ORDER — ZINC SULFATE 220 (50 ZN) MG PO CAPS
220.0000 mg | ORAL_CAPSULE | Freq: Every day | ORAL | Status: DC
  Administered 2021-09-02 – 2021-09-07 (×6): 220 mg via ORAL
  Filled 2021-09-02 (×7): qty 1

## 2021-09-02 MED ORDER — PANTOPRAZOLE SODIUM 40 MG PO TBEC
40.0000 mg | DELAYED_RELEASE_TABLET | Freq: Every day | ORAL | Status: DC
Start: 2021-09-02 — End: 2021-09-07
  Administered 2021-09-02 – 2021-09-07 (×6): 40 mg via ORAL
  Filled 2021-09-02 (×6): qty 1

## 2021-09-02 MED ORDER — ALBUTEROL SULFATE (2.5 MG/3ML) 0.083% IN NEBU
2.5000 mg | INHALATION_SOLUTION | RESPIRATORY_TRACT | Status: DC | PRN
  Administered 2021-09-02 (×4): 2.5 mg via RESPIRATORY_TRACT
  Filled 2021-09-02 (×4): qty 3

## 2021-09-02 MED ORDER — GABAPENTIN 300 MG PO CAPS
300.0000 mg | ORAL_CAPSULE | Freq: Three times a day (TID) | ORAL | Status: DC
  Administered 2021-09-02 – 2021-09-07 (×15): 300 mg via ORAL
  Filled 2021-09-02 (×15): qty 1

## 2021-09-02 MED ORDER — ALBUTEROL SULFATE HFA 108 (90 BASE) MCG/ACT IN AERS: 2.0000 | INHALATION_SPRAY | RESPIRATORY_TRACT | Status: DC | PRN

## 2021-09-02 MED ORDER — GABAPENTIN 300 MG PO CAPS
600.0000 mg | ORAL_CAPSULE | Freq: Three times a day (TID) | ORAL | Status: DC
Start: 2021-09-02 — End: 2021-09-02
  Administered 2021-09-02: 600 mg via ORAL
  Filled 2021-09-02: qty 2

## 2021-09-02 MED ORDER — BENZONATATE 100 MG PO CAPS
100.0000 mg | ORAL_CAPSULE | Freq: Three times a day (TID) | ORAL | Status: DC | PRN
  Administered 2021-09-02 – 2021-09-07 (×4): 100 mg via ORAL
  Filled 2021-09-02 (×4): qty 1

## 2021-09-02 MED ORDER — METOPROLOL TARTRATE 25 MG PO TABS
25.0000 mg | ORAL_TABLET | Freq: Two times a day (BID) | ORAL | Status: DC
  Administered 2021-09-02 – 2021-09-07 (×11): 25 mg via ORAL
  Filled 2021-09-02 (×11): qty 1

## 2021-09-02 MED ORDER — INSULIN ASPART PROT & ASPART (70-30 MIX) 100 UNIT/ML PEN: 36.0000 [IU] | PEN_INJECTOR | Freq: Every day | SUBCUTANEOUS | Status: DC

## 2021-09-02 MED ORDER — TIMOLOL MALEATE 0.5 % OP SOLN
1.0000 [drp] | Freq: Two times a day (BID) | OPHTHALMIC | Status: DC
  Administered 2021-09-02 – 2021-09-07 (×10): 1 [drp] via OPHTHALMIC
  Filled 2021-09-02: qty 5

## 2021-09-02 NOTE — ED Notes (Signed)
Date and time results received: 09/02/21 9:16 AM ? ?(use smartphrase ".now" to insert current time) ? ?Test: lactic acid ?Critical Value: 3.4 ? ?Name of Provider Notified: Dr. Doristine Bosworth ? ?Orders Received? Or Actions Taken?: see chart ?

## 2021-09-02 NOTE — Progress Notes (Signed)
?PROGRESS NOTE ? ? ? ?Andrew Larsen  VEH:[id number] DOB: 1940/10/19 DOA: 09/01/2021 ?PCP: Velna Hatchet, MD ? ? ?Brief Narrative:  ?HPI: Andrew Larsen is a pleasant 81 y.o. male with medical history significant for hypertension, type 2 diabetes mellitus, OSA on CPAP, PAD, and BMI 38, presenting to the emergency department with fatigue, lethargy, and cough.  Patient reports that he developed a mild cough yesterday and then severe fatigue last night.  Since last night, he has had difficulty performing his basic ADLs due to severe fatigue.  He reports some purulent drainage from the posterior aspect of his right thigh, noting that he has had a boil there for years that flares up occasionally as it is now.  He denies abdominal pain, vomiting, or diarrhea.  He had a prostate biopsy on 08/30/2021 and took a prophylactic antibiotic for that.  He denies dysuria or flank pain. ?  ?ED Course: Upon arrival to the ED, patient is found to be febrile to 39C, saturating well on room air, and tachycardic with stable blood pressure.  EKG features sinus tachycardia.  Chest x-ray with chronic interstitial thickening.  CTA chest negative for PE or other acute finding.  Chemistry panel notable for creatinine 1.7.  CBC features a leukocytosis to 27,200.  Lactic acid increased from 2.1-3.8.  Troponin was mildly elevated.  Blood cultures were collected and the patient was treated with acetaminophen, Rocephin, and azithromycin. ? ?Assessment & Plan: ?  ?Principal Problem: ?  Severe sepsis ([place]) ?Active Problems: ?  Chronic kidney disease, stage 3b ([place]) ?  Type II diabetes mellitus with stage 3 chronic kidney disease ([place]) ?  Benign essential HTN ?  Cough ? ?Severe sepsis, soft tissue infection right leg: Patient meets criteria for severe sepsis based on fever, tachycardia, tachypnea, leukocytosis as well as lactic acid of more than 3.  Patient's lactic acid improved yesterday but again 3.4 this morning.  His fever has subsided now.   Patient has no other complaint other than cough.  CT angiogram of the chest is negative for any infiltrates.  COVID as well as influenza negative.  Per admitting hospitalist, patient had draining abscess in the posterior side of the thigh but on my examination, there is no fluctuance, there is no drainage and there is no erythema or induration at all and CT right lower extremity is also negative for any abscess.  In my opinion, source is not clear at this point in time.  I am going to check respiratory viral panel.  However patient's procalcitonin is significantly elevated over 5 indicating bacterial infection.  He is on Rocephin and vancomycin which I will continue and follow blood culture.  Monitor closely.  Will start on IV fluids again. ? ?CKD stage IV: Patient does not have CKD stage IIIa but appears that he has progressed and now he has CKD stage IIIb and he is at baseline. ? ?History of essential hypertension: Due to low blood pressure yesterday, his antihypertensives which include Lasix and losartan were held and he is only on metoprolol.  Blood pressures controlled now. ? ?Type 2 diabetes mellitus: Hemoglobin A1c was 9% in December 2022, will recheck.  Currently on SSI.  Takes 70/30, 36 units daily which I will resume. ? ?Elevated troponin: Slightly elevated but flat without having any chest pain or shortness of breath is likely demand ischemia in the setting of sepsis.  Monitor. ? ?Morbid obesity: Will need counseling once he is improved. ? ?DVT prophylaxis: enoxaparin (LOVENOX) injection 40  mg Start: 09/01/21 2030 ?  Code Status: Full Code  ?Family Communication:  None present at bedside.  Plan of care discussed with patient in length and he/she verbalized understanding and agreed with it. ? ?Status is: Inpatient ?Remains inpatient appropriate because: Patient with severe sepsis and sick ? ? ?Estimated body mass index is 37.66 kg/m? as calculated from the following: ?  Height as of this encounter: '5\' 11"'$   (1.803 m). ?  Weight as of this encounter: 122.5 kg. ? ?Pressure Injury 11/28/18 Buttocks Left;Medial Stage II -  Partial thickness loss of dermis presenting as a shallow open ulcer with a red, pink wound bed without slough. small pink opening to the inner buttock, round  (Active)  ?11/28/18   ?Location: Buttocks  ?Location Orientation: Left;Medial  ?Staging: Stage II -  Partial thickness loss of dermis presenting as a shallow open ulcer with a red, pink wound bed without slough.  ?Wound Description (Comments): small pink opening to the inner buttock, round   ?Present on Admission: Yes  ? ?Nutritional Assessment: ?Body mass index is 37.66 kg/m?Marland KitchenMarland Kitchen ?Seen by dietician.  I agree with the assessment and plan as outlined below: ?Nutrition Status: ?  ?  ?  ? ?. ?Skin Assessment: ?I have examined the patient's skin and I agree with the wound assessment as performed by the wound care RN as outlined below: ?Pressure Injury 11/28/18 Buttocks Left;Medial Stage II -  Partial thickness loss of dermis presenting as a shallow open ulcer with a red, pink wound bed without slough. small pink opening to the inner buttock, round  (Active)  ?11/28/18   ?Location: Buttocks  ?Location Orientation: Left;Medial  ?Staging: Stage II -  Partial thickness loss of dermis presenting as a shallow open ulcer with a red, pink wound bed without slough.  ?Wound Description (Comments): small pink opening to the inner buttock, round   ?Present on Admission: Yes  ? ? ?Consultants:  ?None ? ?Procedures:  ?None ? ?Antimicrobials:  ?Anti-infectives (From admission, onward)  ? ? Start     Dose/Rate Route Frequency Ordered Stop  ? 09/02/21 2200  vancomycin (VANCOCIN) IVPB 1000 mg/200 mL premix       ? 1,000 mg ?200 mL/hr over 60 Minutes Intravenous Every 24 hours 09/01/21 2208    ? 09/02/21 1545  cefTRIAXone (ROCEPHIN) 2 g in sodium chloride 0.9 % 100 mL IVPB       ? 2 g ?200 mL/hr over 30 Minutes Intravenous Every 24 hours 09/01/21 2027 09/07/21 1544  ?  09/01/21 2215  vancomycin (VANCOCIN) 2,500 mg in sodium chloride 0.9 % 500 mL IVPB       ? 2,500 mg ?262.5 mL/hr over 120 Minutes Intravenous  Once 09/01/21 2208 09/02/21 0141  ? 09/01/21 2145  vancomycin (VANCOCIN) IVPB 1000 mg/200 mL premix  Status:  Discontinued       ? 1,000 mg ?200 mL/hr over 60 Minutes Intravenous  Once 09/01/21 2131 09/01/21 2205  ? 09/01/21 1545  cefTRIAXone (ROCEPHIN) 2 g in sodium chloride 0.9 % 100 mL IVPB       ? 2 g ?200 mL/hr over 30 Minutes Intravenous  Once 09/01/21 1537 09/01/21 1829  ? 09/01/21 1545  azithromycin (ZITHROMAX) 500 mg in sodium chloride 0.9 % 250 mL IVPB       ? 500 mg ?250 mL/hr over 60 Minutes Intravenous  Once 09/01/21 1537 09/01/21 2018  ? ?  ?  ? ? ?Subjective: ?Patient seen and examined.  Still in the ED.  Only complaint he has is the cough.  No shortness of breath.  No chest pain, no other complaint. ? ?Objective: ?Vitals:  ? 09/02/21 0100 09/02/21 0200 09/02/21 0615 09/02/21 0900  ?BP: 131/68 126/71 108/70 (!) 129/58  ?Pulse: (!) 125 (!) 126 (!) 123 (!) 125  ?Resp: 20 13 (!) 27 (!) 25  ?Temp:      ?TempSrc:      ?SpO2: 99% 95% 100% 98%  ?Weight:      ?Height:      ? ?No intake or output data in the 24 hours ending 09/02/21 1038 ?Filed Weights  ? 09/01/21 1831  ?Weight: 122.5 kg  ? ? ?Examination: ? ?General exam: Appears calm and comfortable morbidly obese ?Respiratory system: Diminished breath sounds due to body habitus.  Poor inspiratory effort due to body habitus. ?Cardiovascular system: S1 & S2 heard, RRR. No JVD, murmurs, rubs, gallops or clicks. No pedal edema. ?Gastrointestinal system: Abdomen is nondistended, soft and nontender. No organomegaly or masses felt. Normal bowel sounds heard. ?Central nervous system: Alert and oriented. No focal neurological deficits. ?Extremities: Symmetric 5 x 5 power. ?Skin: No rashes, lesions or ulcers ?Psychiatry: Judgement and insight appear normal. Mood & affect appropriate.  ? ? ?Data Reviewed: I have personally  reviewed following labs and imaging studies ? ?CBC: ?Recent Labs  ?Lab 09/01/21 ?1236 09/01/21 ?1511 09/02/21 ?0413  ?WBC 23.3* 27.2* 25.4*  ?NEUTROABS  --  21.9*  --   ?HGB 15.6 15.5 15.7  ?HCT 46.1 46.5 47.0  ?M

## 2021-09-02 NOTE — ED Notes (Signed)
Pt requesting breathing treatment. MD paged.  ?

## 2021-09-03 ENCOUNTER — Inpatient Hospital Stay (HOSPITAL_COMMUNITY): Payer: PPO

## 2021-09-03 DIAGNOSIS — R6520 Severe sepsis without septic shock: Secondary | ICD-10-CM | POA: Diagnosis not present

## 2021-09-03 DIAGNOSIS — A419 Sepsis, unspecified organism: Secondary | ICD-10-CM | POA: Diagnosis not present

## 2021-09-03 LAB — CBC
HCT: 41.1 % (ref 39.0–52.0)
Hemoglobin: 13.7 g/dL (ref 13.0–17.0)
MCH: 30.9 pg (ref 26.0–34.0)
MCHC: 33.3 g/dL (ref 30.0–36.0)
MCV: 92.6 fL (ref 80.0–100.0)
Platelets: 162 10*3/uL (ref 150–400)
RBC: 4.44 MIL/uL (ref 4.22–5.81)
RDW: 14.7 % (ref 11.5–15.5)
WBC: 22.8 10*3/uL — ABNORMAL HIGH (ref 4.0–10.5)
nRBC: 0 % (ref 0.0–0.2)

## 2021-09-03 LAB — LACTIC ACID, PLASMA
Lactic Acid, Venous: 1.4 mmol/L (ref 0.5–1.9)
Lactic Acid, Venous: 1.4 mmol/L (ref 0.5–1.9)

## 2021-09-03 LAB — BASIC METABOLIC PANEL
Anion gap: 10 (ref 5–15)
BUN: 29 mg/dL — ABNORMAL HIGH (ref 8–23)
CO2: 24 mmol/L (ref 22–32)
Calcium: 8.5 mg/dL — ABNORMAL LOW (ref 8.9–10.3)
Chloride: 100 mmol/L (ref 98–111)
Creatinine, Ser: 2.03 mg/dL — ABNORMAL HIGH (ref 0.61–1.24)
GFR, Estimated: 33 mL/min — ABNORMAL LOW (ref >60)
Glucose, Bld: 255 mg/dL — ABNORMAL HIGH (ref 70–99)
Potassium: 4.2 mmol/L (ref 3.5–5.1)
Sodium: 134 mmol/L — ABNORMAL LOW (ref 135–145)

## 2021-09-03 LAB — GLUCOSE, CAPILLARY
Glucose-Capillary: 256 mg/dL — ABNORMAL HIGH (ref 70–99)
Glucose-Capillary: 233 mg/dL — ABNORMAL HIGH (ref 70–99)
Glucose-Capillary: 194 mg/dL — ABNORMAL HIGH (ref 70–99)
Glucose-Capillary: 230 mg/dL — ABNORMAL HIGH (ref 70–99)

## 2021-09-03 LAB — MAGNESIUM: Magnesium: 1.7 mg/dL (ref 1.7–2.4)

## 2021-09-03 LAB — PROCALCITONIN: Procalcitonin: 9.23 ng/mL

## 2021-09-03 MED ORDER — IOHEXOL 9 MG/ML PO SOLN
ORAL | Status: AC
  Administered 2021-09-03: 500 mL
  Filled 2021-09-03: qty 1000

## 2021-09-03 MED ORDER — IPRATROPIUM-ALBUTEROL 0.5-2.5 (3) MG/3ML IN SOLN
3.0000 mL | Freq: Four times a day (QID) | RESPIRATORY_TRACT | Status: DC | PRN
  Administered 2021-09-03 – 2021-09-07 (×2): 3 mL via RESPIRATORY_TRACT
  Filled 2021-09-03 (×2): qty 3

## 2021-09-03 MED ORDER — ZOLPIDEM TARTRATE 5 MG PO TABS
5.0000 mg | ORAL_TABLET | Freq: Every evening | ORAL | Status: DC | PRN
  Administered 2021-09-03 – 2021-09-05 (×3): 5 mg via ORAL
  Filled 2021-09-03 (×3): qty 1

## 2021-09-03 MED ORDER — ALPRAZOLAM 0.25 MG PO TABS
0.2500 mg | ORAL_TABLET | Freq: Once | ORAL | Status: AC
  Administered 2021-09-03: 0.25 mg via ORAL
  Filled 2021-09-03: qty 1

## 2021-09-03 NOTE — Progress Notes (Signed)
Pt states he started the night with CPAP but had to take it off last night and does not want to wear it tonight. He states our machines are too noisy. He prefers to wear his "oxygen in his nose". RT encouraged pt he should wear it every night and he states he does at home but his is not that noisy. Explained to pt to call if he changes his mind.  ?

## 2021-09-03 NOTE — Progress Notes (Signed)
?PROGRESS NOTE ? ? ? ?Andrew Larsen  YPP:[id number] DOB: 1940/07/13 DOA: 09/01/2021 ?PCP: Velna Hatchet, MD ? ? ?Brief Narrative:  ?HPI: Andrew Larsen is a pleasant 81 y.o. male with medical history significant for hypertension, type 2 diabetes mellitus, OSA on CPAP, PAD, and BMI 38, presenting to the emergency department with fatigue, lethargy, and cough.  Patient reports that he developed a mild cough yesterday and then severe fatigue last night.  Since last night, he has had difficulty performing his basic ADLs due to severe fatigue.  He reports some purulent drainage from the posterior aspect of his right thigh, noting that he has had a boil there for years that flares up occasionally as it is now.  He denies abdominal pain, vomiting, or diarrhea.  He had a prostate biopsy on 08/30/2021 and took a prophylactic antibiotic for that.  He denies dysuria or flank pain. ?  ?ED Course: Upon arrival to the ED, patient is found to be febrile to 39C, saturating well on room air, and tachycardic with stable blood pressure.  EKG features sinus tachycardia.  Chest x-ray with chronic interstitial thickening.  CTA chest negative for PE or other acute finding.  Chemistry panel notable for creatinine 1.7.  CBC features a leukocytosis to 27,200.  Lactic acid increased from 2.1-3.8.  Troponin was mildly elevated.  Blood cultures were collected and the patient was treated with acetaminophen, Rocephin, and azithromycin. ? ?Assessment & Plan: ?  ?Principal Problem: ?  Severe sepsis ([place]) ?Active Problems: ?  Chronic kidney disease, stage 3b ([place]) ?  Type II diabetes mellitus with stage 3 chronic kidney disease ([place]) ?  Benign essential HTN ?  Cough ? ?Severe sepsis, soft tissue infection right leg: Patient meets criteria for severe sepsis based on fever, tachycardia, tachypnea, leukocytosis as well as lactic acid of more than 3.  Last temperature spike was at 11:30 AM and his fever has subsided now.  Patient's only complaint is  cough and shortness of breath.  On exam he has wheezes at the bases bilaterally.  His procalcitonin is rising which indicates that he has bacterial infection brewing somewhere in the body.  His symptoms are suggestive of pneumonia but interestingly CT angiogram of the chest as well as chest x-ray were negative, I wonder if he has early pneumonia which is not seen on the imaging studies.  Just to make sure, I will proceed with CT abdomen and pelvis to make sure there is no intra-abdominal infection.  I will also repeat his chest x-ray today.  I will continue his current antibiotics and follow culture and procalcitonin closely.   ? ?CKD stage IV: Patient does not have CKD stage IIIa but appears that he has progressed and now he has CKD stage IIIb and he is at baseline. ? ?History of essential hypertension: Due to low blood pressure yesterday, his antihypertensives which include Lasix and losartan were held and he is only on metoprolol.  Blood pressures controlled now. ? ?Type 2 diabetes mellitus: Hemoglobin A1c was 9% in December 2022, takes 70/30, 36 units at home which is resumed and he is also on SSI, blood sugar slightly elevated.  We will continue current regimen. ? ?Elevated troponin: Slightly elevated but flat without having any chest pain or shortness of breath is likely demand ischemia in the setting of sepsis.  Monitor. ? ?Morbid obesity: Will need counseling once he is improved. ? ?Anxiety: Requesting something for anxiety.  We will place one-time order of Xanax now and  Ambien nightly as needed for insomnia. ? ?DVT prophylaxis: enoxaparin (LOVENOX) injection 40 mg Start: 09/01/21 2030 ?  Code Status: Full Code  ?Family Communication:  None present at bedside.  Plan of care discussed with patient in length and he/she verbalized understanding and agreed with it.  ? ?Status is: Inpatient ?Remains inpatient appropriate because: Patient with severe sepsis and sick ? ? ?Estimated body mass index is 37.66 kg/m? as  calculated from the following: ?  Height as of this encounter: '5\' 11"'$  (1.803 m). ?  Weight as of this encounter: 122.5 kg. ? ?Pressure Injury 11/28/18 Buttocks Left;Medial Stage II -  Partial thickness loss of dermis presenting as a shallow open ulcer with a red, pink wound bed without slough. small pink opening to the inner buttock, round  (Active)  ?11/28/18   ?Location: Buttocks  ?Location Orientation: Left;Medial  ?Staging: Stage II -  Partial thickness loss of dermis presenting as a shallow open ulcer with a red, pink wound bed without slough.  ?Wound Description (Comments): small pink opening to the inner buttock, round   ?Present on Admission: Yes  ? ?Nutritional Assessment: ?Body mass index is 37.66 kg/m?Marland KitchenMarland Kitchen ?Seen by dietician.  I agree with the assessment and plan as outlined below: ?Nutrition Status: ?  ?  ?  ? ?. ?Skin Assessment: ?I have examined the patient's skin and I agree with the wound assessment as performed by the wound care RN as outlined below: ?Pressure Injury 11/28/18 Buttocks Left;Medial Stage II -  Partial thickness loss of dermis presenting as a shallow open ulcer with a red, pink wound bed without slough. small pink opening to the inner buttock, round  (Active)  ?11/28/18   ?Location: Buttocks  ?Location Orientation: Left;Medial  ?Staging: Stage II -  Partial thickness loss of dermis presenting as a shallow open ulcer with a red, pink wound bed without slough.  ?Wound Description (Comments): small pink opening to the inner buttock, round   ?Present on Admission: Yes  ? ? ?Consultants:  ?None ? ?Procedures:  ?None ? ?Antimicrobials:  ?Anti-infectives (From admission, onward)  ? ? Start     Dose/Rate Route Frequency Ordered Stop  ? 09/02/21 2200  vancomycin (VANCOCIN) IVPB 1000 mg/200 mL premix       ? 1,000 mg ?200 mL/hr over 60 Minutes Intravenous Every 24 hours 09/01/21 2208    ? 09/02/21 1545  cefTRIAXone (ROCEPHIN) 2 g in sodium chloride 0.9 % 100 mL IVPB       ? 2 g ?200 mL/hr over 30  Minutes Intravenous Every 24 hours 09/01/21 2027 09/07/21 1544  ? 09/01/21 2215  vancomycin (VANCOCIN) 2,500 mg in sodium chloride 0.9 % 500 mL IVPB       ? 2,500 mg ?262.5 mL/hr over 120 Minutes Intravenous  Once 09/01/21 2208 09/02/21 0141  ? 09/01/21 2145  vancomycin (VANCOCIN) IVPB 1000 mg/200 mL premix  Status:  Discontinued       ? 1,000 mg ?200 mL/hr over 60 Minutes Intravenous  Once 09/01/21 2131 09/01/21 2205  ? 09/01/21 1545  cefTRIAXone (ROCEPHIN) 2 g in sodium chloride 0.9 % 100 mL IVPB       ? 2 g ?200 mL/hr over 30 Minutes Intravenous  Once 09/01/21 1537 09/01/21 1829  ? 09/01/21 1545  azithromycin (ZITHROMAX) 500 mg in sodium chloride 0.9 % 250 mL IVPB       ? 500 mg ?250 mL/hr over 60 Minutes Intravenous  Once 09/01/21 1537 09/01/21 2018  ? ?  ?  ? ? ?  Subjective: ? ?Patient seen and examined.  He states that overall he feels better than yesterday but still has cough and shortness of breath.  No other complaint. ? ?Objective: ?Vitals:  ? 09/02/21 2129 09/02/21 2340 09/03/21 0546 09/03/21 8127  ?BP: 120/72 132/70 (!) 150/68 123/70  ?Pulse: (!) 118 (!) 114 (!) 109 (!) 108  ?Resp: '18 18  18  '$ ?Temp: 99.6 ?F (37.6 ?C) 99 ?F (37.2 ?C) 99.4 ?F (37.4 ?C) 99.2 ?F (37.3 ?C)  ?TempSrc: Oral Oral Oral   ?SpO2: 97% 98% 96% 100%  ?Weight:      ?Height:      ? ? ?Intake/Output Summary (Last 24 hours) at 09/03/2021 1114 ?Last data filed at 09/03/2021 0825 ?Gross per 24 hour  ?Intake 2535.29 ml  ?Output 1650 ml  ?Net 885.29 ml  ? ?Filed Weights  ? 09/01/21 1831  ?Weight: 122.5 kg  ? ? ?Examination: ? ?General exam: Appears calm and comfortable, morbidly obese ?Respiratory system: Diminished breath sounds with end expiratory wheezes at the bases bilaterally, ?  Rhonchi. Respiratory effort normal. ?Cardiovascular system: S1 & S2 heard, RRR. No JVD, murmurs, rubs, gallops or clicks. No pedal edema. ?Gastrointestinal system: Abdomen is nondistended, soft and nontender. No organomegaly or masses felt. Normal bowel sounds  heard. ?Central nervous system: Alert and oriented. No focal neurological deficits. ?Extremities: Symmetric 5 x 5 power. ?Skin: No rashes, lesions or ulcers.  ?Psychiatry: Judgement and insight appear normal.

## 2021-09-03 NOTE — Progress Notes (Signed)
?   09/02/21 2129  ?Assess: MEWS Score  ?Temp 99.6 ?F (37.6 ?C)  ?BP 120/72  ?Pulse Rate (!) 118  ?Resp 18  ?SpO2 97 %  ?O2 Device CPAP  ?Assess: MEWS Score  ?MEWS Temp 0  ?MEWS Systolic 0  ?MEWS Pulse 2  ?MEWS RR 0  ?MEWS LOC 0  ?MEWS Score 2  ?MEWS Score Color Yellow  ?Assess: if the MEWS score is Yellow or Red  ?Were vital signs taken at a resting state? Yes  ?Focused Assessment No change from prior assessment  ?Early Detection of Sepsis Score *See Row Information* Medium  ?MEWS guidelines implemented *See Row Information* Yes  ?Take Vital Signs  ?Increase Vital Sign Frequency  Yellow: Q 2hr X 2 then Q 4hr X 2, if remains yellow, continue Q 4hrs  ?Escalate  ?MEWS: Escalate Yellow: discuss with charge nurse/RN and consider discussing with provider and RRT  ?Notify: Charge Nurse/RN  ?Name of Charge Nurse/RN Notified [place] RN  ?Date Charge Nurse/RN Notified 09/02/21  ?Time Charge Nurse/RN Notified 2145  ?Document  ?Patient Outcome Other (Comment) ?(pt stable)  ?Progress note created (see row info) Yes  ? ? ?

## 2021-09-03 NOTE — Progress Notes (Signed)
Pt is very SOB and has fluids running at 125. Pt normally takes Lasix but it is on hold. RN put oxygen on pt at 2 LPM [name] and messaged MD on call for further instructions. Pt is sating at 96% on 2 LPM.  ? ?Andrew Larsen Andrew Larsen ? ?

## 2021-09-03 NOTE — Progress Notes (Signed)
?   09/02/21 2340  ?Assess: MEWS Score  ?Temp 99 ?F (37.2 ?C)  ?BP 132/70  ?Pulse Rate (!) 114  ?Resp 18  ?SpO2 98 %  ?O2 Device CPAP  ?Assess: MEWS Score  ?MEWS Temp 0  ?MEWS Systolic 0  ?MEWS Pulse 2  ?MEWS RR 0  ?MEWS LOC 0  ?MEWS Score 2  ?MEWS Score Color Yellow  ?Assess: if the MEWS score is Yellow or Red  ?Were vital signs taken at a resting state? Yes  ?Focused Assessment No change from prior assessment  ?Early Detection of Sepsis Score *See Row Information* Medium  ?MEWS guidelines implemented *See Row Information* No, previously yellow, continue vital signs every 4 hours  ?Document  ?Patient Outcome Other (Comment) ?(pt is stable)  ?Progress note created (see row info) Yes  ? ? ?

## 2021-09-04 DIAGNOSIS — R6520 Severe sepsis without septic shock: Secondary | ICD-10-CM | POA: Diagnosis not present

## 2021-09-04 DIAGNOSIS — A419 Sepsis, unspecified organism: Secondary | ICD-10-CM | POA: Diagnosis not present

## 2021-09-04 LAB — CBC
HCT: 40.8 % (ref 39.0–52.0)
Hemoglobin: 13.8 g/dL (ref 13.0–17.0)
MCH: 30.9 pg (ref 26.0–34.0)
MCHC: 33.8 g/dL (ref 30.0–36.0)
MCV: 91.3 fL (ref 80.0–100.0)
Platelets: 150 10*3/uL (ref 150–400)
RBC: 4.47 MIL/uL (ref 4.22–5.81)
RDW: 14.6 % (ref 11.5–15.5)
WBC: 15.1 10*3/uL — ABNORMAL HIGH (ref 4.0–10.5)
nRBC: 0 % (ref 0.0–0.2)

## 2021-09-04 LAB — GLUCOSE, CAPILLARY
Glucose-Capillary: 203 mg/dL — ABNORMAL HIGH (ref 70–99)
Glucose-Capillary: 386 mg/dL — ABNORMAL HIGH (ref 70–99)
Glucose-Capillary: 260 mg/dL — ABNORMAL HIGH (ref 70–99)
Glucose-Capillary: 206 mg/dL — ABNORMAL HIGH (ref 70–99)

## 2021-09-04 LAB — URINE CULTURE: Culture: 60000 — AB

## 2021-09-04 LAB — BASIC METABOLIC PANEL
Anion gap: 10 (ref 5–15)
BUN: 24 mg/dL — ABNORMAL HIGH (ref 8–23)
CO2: 22 mmol/L (ref 22–32)
Calcium: 8.6 mg/dL — ABNORMAL LOW (ref 8.9–10.3)
Chloride: 103 mmol/L (ref 98–111)
Creatinine, Ser: 1.47 mg/dL — ABNORMAL HIGH (ref 0.61–1.24)
GFR, Estimated: 49 mL/min — ABNORMAL LOW (ref >60)
Glucose, Bld: 226 mg/dL — ABNORMAL HIGH (ref 70–99)
Potassium: 3.8 mmol/L (ref 3.5–5.1)
Sodium: 135 mmol/L (ref 135–145)

## 2021-09-04 MED ORDER — INSULIN ASPART PROT & ASPART (70-30 MIX) 100 UNIT/ML ~~LOC~~ SUSP: 40.0000 [IU] | Freq: Every day | SUBCUTANEOUS | Status: DC

## 2021-09-04 NOTE — TOC Progression Note (Signed)
Transition of Care (TOC) - Initial/Assessment Note  ? ? ?Patient Details  ?Name: Andrew Larsen ?MRN: [id number] ?Date of Birth: July 17, 1940 ? ?Transition of Care (TOC) CM/SW Contact:    ?Paulene Floor Suellen Durocher, LCSWA ?Phone Number: ?09/04/2021, 4:33 PM ? ?Clinical Narrative:                 ?CSW met with the patient and the patient's grandson at bedside to inquire about the patient's willingness to go to a SNF as [facility] is unable to accept.  The patient reported that the family is in discussion about discharge plans at this time.  The patient's grandson concurred and stated that the patient's spouse and daughter should have a decision soon.  The patient reported that he has received "at least" 4 COVID vaccines.   ? ?TOC will continue to follow and follow up with family about decision.  ? ?Expected Discharge Plan: [place] ?Barriers to Discharge: Continued Medical Work up ? ? ?Patient Goals and CMS Choice ?Patient states their goals for this hospitalization and ongoing recovery are:: To go to rehab and return home. ?CMS Medicare.gov Compare Post Acute Care list provided to:: Patient ?Choice offered to / list presented to : Patient, Adult Children [name]) ? ?Expected Discharge Plan and Services ?Expected Discharge Plan: [place] ?  ?Discharge Planning Services: CM Consult ?  ?Living arrangements for the past 2 months: [place] ?                ?  ?  ?  ?  ?  ?  ?[place] Agency:  (A[facility]) ?  ?  ?  ? ?Prior Living Arrangements/Services ?Living arrangements for the past 2 months: [place] ?Lives with:: Adult Children (Daughter, [name].) ?Patient language and need for interpreter reviewed:: Yes ?Do you feel safe going back to the place where you live?: Yes      ?Need for Family Participation in Patient Care: Yes (Comment) ?Care giver support system in place?: Yes (comment) ?Current home services: DME (Cane, walker, w/c, shwer chair, rollator.) ?Criminal Activity/Legal Involvement Pertinent to Current  Situation/Hospitalization: No - Comment as needed ? ?Activities of Daily Living ?Home Assistive Devices/Equipment: Cane (specify quad or straight), Walker (specify type) ?ADL Screening (condition at time of admission) ?Patient's cognitive ability adequate to safely complete daily activities?: Yes ?Is the patient deaf or have difficulty hearing?: No ?Does the patient have difficulty seeing, even when wearing glasses/contacts?: No ?Does the patient have difficulty concentrating, remembering, or making decisions?: No ?Patient able to express need for assistance with ADLs?: Yes ?Does the patient have difficulty dressing or bathing?: No ?Independently performs ADLs?: Yes (appropriate for developmental age) ?Does the patient have difficulty walking or climbing stairs?: Yes ?Weakness of Legs: Both ?Weakness of Arms/Hands: None ? ?Permission Sought/Granted ?Permission sought to share information with : Case Manager, Customer service manager, Family Supports ?Permission granted to share information with : Yes, Verbal Permission Granted ?   ?   ?   ?   ? ?Emotional Assessment ?Appearance:: Appears stated age ?Attitude/Demeanor/Rapport: Engaged, Gracious ?Affect (typically observed): Accepting, Appropriate, Calm, Hopeful ?Orientation: : Oriented to Self, Oriented to Place, Oriented to  Time, Oriented to Situation ?Alcohol / Substance Use: Not Applicable ?Psych Involvement: No (comment) ? ?Admission diagnosis:  Shortness of breath [R06.02] ?Sepsis ([place]) [A41.9] ?Fever, unspecified fever cause [R50.9] ?Patient Active Problem List  ? Diagnosis Date Noted  ? Severe sepsis ([place]) 09/01/2021  ? Cough 01/07/2019  ? Labile blood glucose   ?  SOB (shortness of breath)   ? Urinary frequency   ? Hypoalbuminemia due to protein-calorie malnutrition ([place])   ? Supplemental oxygen dependent   ? Controlled type 2 diabetes mellitus with hyperglycemia ([place])   ? Stage 3a chronic kidney disease (CKD) ([place])   ? Benign essential HTN   ?  Physical debility 12/01/2018  ? Pressure injury of skin 11/27/2018  ? 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 Chronic kidney disease, stage 3b ([place]) 11/03/2018  ? Type II diabetes mellitus with stage 3 chronic kidney disease ([place]) 11/03/2018  ? Essential hypertension 11/03/2018  ? Claudication in peripheral vascular disease ([place]) 06/10/2016  ? Morbid obesity ([place]) 09/21/2015  ? Upper airway cough syndrome 09/20/2015  ? Cough variant asthma 09/20/2015  ? ?PCP:  Velna Hatchet, MD ?Pharmacy:   ?[facility] [place], [address] ?[address] ?[place] [address] ?Phone: [phone] Fax: [phone] ? ? ? ? ?Social Determinants of Health (SDOH) Interventions ?  ? ?Readmission Risk Interventions ?   ? View : No data to display.  ?  ?  ?  ? ? ? ?

## 2021-09-04 NOTE — Evaluation (Signed)
Occupational Therapy Evaluation ?Patient Details ?Name: Andrew Larsen ?MRN: [id number] ?DOB: [birth year] ?Today's Date: 09/04/2021 ? ? ?History of Present Illness Patient is a 81 yo male presenting to the ED with fatigue, lethargy and cough on 09/01/21. Patient admitted with sepsis, with draining abscess on R leg, elevated WBC, troponin, and tachycardia on 09/01/21. PMH includes: , type 2 diabetes mellitus, OSA on CPAP, PAD, and BMI 38  ? ?Clinical Impression ?  ?Prior to this admission, patient living with his daughter and son in law and independent in ADLs and able to complete household mobility with rolling walker or rollator. Family is there intermittently, with daughter working from home 3 days out of the week. Currently, patient is demonstrating deficits in cognition, ADLs and transfers, requiring min-mod A of 2 to engage in sit<>stand transfers, and mod A for ADLs. OT recommending AIR placement due to strong family support and previous level of function. OT will continue to follow acutely to address deficits listed below.  ?   ? ?Recommendations for follow up therapy are one component of a multi-disciplinary discharge planning process, led by the attending physician.  Recommendations may be updated based on patient status, additional functional criteria and insurance authorization.  ? ?Follow Up Recommendations ? Acute inpatient rehab (3hours/day)  ?  ?Assistance Recommended at Discharge Frequent or constant Supervision/Assistance  ?Patient can return home with the following Two people to help with walking and/or transfers;A lot of help with bathing/dressing/bathroom;Assistance with cooking/housework;Direct supervision/assist for medications management;Direct supervision/assist for financial management;Assist for transportation;Help with stairs or ramp for entrance ? ?  ?Functional Status Assessment ? Patient has had a recent decline in their functional status and demonstrates the ability to make significant  improvements in function in a reasonable and predictable amount of time.  ?Equipment Recommendations ? None recommended by OT (patient has DME needed)  ?  ?Recommendations for Other Services Rehab consult ? ? ?  ?Precautions / Restrictions Precautions ?Precautions: Fall ?Restrictions ?Weight Bearing Restrictions: No  ? ?  ? ?Mobility Bed Mobility ?Overal bed mobility: Needs Assistance ?Bed Mobility: Supine to Sit ?  ?  ?Supine to sit: Min assist ?  ?  ?General bed mobility comments: cues to motor plan and use bed rail, min assist for safety and to elevate trunk ?  ? ?Transfers ?Overall transfer level: Needs assistance ?Equipment used: Rolling walker (2 wheels) ?Transfers: Sit to/from Stand ?Sit to Stand: Mod assist, +2 safety/equipment, +2 physical assistance ?  ?  ?  ?  ?  ?General transfer comment: attempted x1 from lowered bed surface and unable to come into standing, completing sit<>stands x2 from elevated surface, mod of 2 to complete initial stand, then min of 2 to complete and initate ambulation (noted minimal R leg buckling with ambulation) ?  ? ?  ?Balance Overall balance assessment: Needs assistance ?Sitting-balance support: Bilateral upper extremity supported, Feet supported ?Sitting balance-Leahy Scale: Fair ?  ?  ?Standing balance support: Bilateral upper extremity supported, During functional activity, Reliant on assistive device for balance ?Standing balance-Leahy Scale: Poor ?Standing balance comment: Heavy reliance on RW ?  ?  ?  ?  ?  ?  ?  ?  ?  ?  ?  ?   ? ?ADL either performed or assessed with clinical judgement  ? ?ADL Overall ADL's : Needs assistance/impaired ?Eating/Feeding: Sitting;Set up ?  ?Grooming: Set up;Sitting ?  ?Upper Body Bathing: Set up;Sitting ?  ?Lower Body Bathing: Moderate assistance;Sitting/lateral leans ?  ?Upper Body Dressing : Set  up;Sitting ?  ?Lower Body Dressing: Moderate assistance;Sitting/lateral leans ?  ?Toilet Transfer: Moderate assistance;+2 for physical  assistance;+2 for safety/equipment;Ambulation ?Toilet Transfer Details (indicate cue type and reason): cues needed to power up for hand placement and for using momentum on the count of 3 to initiate, min A of 2 from elevated surface, mod of 2 in order to complete from lowered surface ?Toileting- Clothing Manipulation and Hygiene: Moderate assistance;Sit to/from stand ?  ?  ?  ?Functional mobility during ADLs: Moderate assistance;+2 for physical assistance;+2 for safety/equipment;Cueing for sequencing;Cueing for safety;Rolling walker (2 wheels) ?General ADL Comments: Patient presenting with decreased activity tolerance, decreased cognition, and poor insight into deficits  ? ? ? ?Vision Baseline Vision/History: 1 Wears glasses (readers) ?Ability to See in Adequate Light: 0 Adequate ?Patient Visual Report: No change from baseline ?   ?   ?Perception   ?  ?Praxis   ?  ? ?Pertinent Vitals/Pain Pain Assessment ?Pain Assessment: Faces ?Faces Pain Scale: Hurts little more ?Pain Location: chest when coughing and R leg when ambulating ?Pain Descriptors / Indicators: Discomfort, Grimacing, Guarding, Heaviness ?Pain Intervention(s): Limited activity within patient's tolerance, Monitored during session, Repositioned  ? ? ? ?Hand Dominance Right ?  ?Extremity/Trunk Assessment Upper Extremity Assessment ?Upper Extremity Assessment: Generalized weakness ?  ?Lower Extremity Assessment ?Lower Extremity Assessment: Defer to PT evaluation ?  ?Cervical / Trunk Assessment ?Cervical / Trunk Assessment: Normal ?  ?Communication Communication ?Communication: No difficulties ?  ?Cognition Arousal/Alertness: Awake/alert ?Behavior During Therapy: [facility] for tasks assessed/performed ?Overall Cognitive Status: Impaired/Different from baseline ?Area of Impairment: Attention, Following commands, Safety/judgement, Awareness, Problem solving ?  ?  ?  ?  ?  ?  ?  ?  ?  ?Current Attention Level: Selective ?  ?Following Commands: Follows one step commands  with increased time, Follows multi-step commands inconsistently, Follows one step commands inconsistently, Follows multi-step commands with increased time ?Safety/Judgement: Decreased awareness of safety, Decreased awareness of deficits ?Awareness: Intellectual ?Problem Solving: Slow processing, Decreased initiation, Difficulty sequencing, Requires verbal cues, Requires tactile cues ?General Comments: Patient uses humor to deflect, significant increased time needed to follow all commands, needing increased asssit to complete all aspects of care, but feels he is at a level to go home ?  ?  ?General Comments  Poor insight into deficits, but VSS on 2L ? ?  ?Exercises   ?  ?Shoulder Instructions    ? ? ?Home Living Family/patient expects to be discharged to:: Private residence ?Living Arrangements: Children ?Available Help at Discharge: Family (there most of the time) ?Type of Home: House ?Home Access: Stairs to enter ?Entrance Stairs-Number of Steps: 3 ?Entrance Stairs-Rails: Right;Left;Can reach both ?Home Layout: Two level;Able to live on main level with bedroom/bathroom ?  ?  ?Bathroom Shower/Tub: Tub/shower unit ?  ?Bathroom Toilet: Handicapped height ?  ?  ?Home Equipment: Tub bench;Rolling Walker (2 wheels);Wheelchair - manual;Grab bars - tub/shower ?  ?  ?  ? ?  ?Prior Functioning/Environment Prior Level of Function : Needs assist;Independent/Modified Independent ?  ?  ?  ?  ?  ?  ?Mobility Comments: Uses RW for mobility tasks. ?ADLs Comments: Independent with bathing/dressing ?  ? ?  ?  ?OT Problem List: Decreased strength;Decreased range of motion;Decreased activity tolerance;Impaired balance (sitting and/or standing);Decreased coordination;Decreased cognition;Decreased safety awareness;Decreased knowledge of use of DME or AE;Decreased knowledge of precautions;Obesity;Pain ?  ?   ?OT Treatment/Interventions: Self-care/ADL training;Therapeutic exercise;Energy conservation;DME and/or AE instruction;Manual  therapy;Therapeutic activities;Cognitive remediation/compensation;Patient/family education;Balance training  ?  ?OT Goals(Current  goals can be found in the care plan section) Acute Rehab OT Goals ?Patient Stated Goal: to

## 2021-09-04 NOTE — Progress Notes (Signed)
Inpatient Rehab Admissions Coordinator Note:  ? ?Per therapy patient was screened for CIR candidacy by Naarah Borgerding Danford Bad, CCC-SLP. Per payor trends, doubtful that this diagnosis would be approved for CIR authorization. Other rehab option should be explored. ? ? ?Gayland Curry, MS, CCC-SLP ?Admissions Coordinator ?[phone] ?09/04/21 ?1:17 PM ? ?

## 2021-09-04 NOTE — Plan of Care (Signed)
?  Problem: Education: ?Goal: Knowledge of General Education information will improve ?Description: Including pain rating scale, medication(s)/side effects and non-pharmacologic comfort measures ?Outcome: Completed/Met ?  ?

## 2021-09-04 NOTE — Progress Notes (Signed)
Pt refusing cpap for the night. ?

## 2021-09-04 NOTE — Progress Notes (Signed)
?PROGRESS NOTE ? ? ? ?Andrew Larsen  YIR:[id number] DOB: 10/29/1940 DOA: 09/01/2021 ?PCP: Velna Hatchet, MD ? ? ?Brief Narrative:  ?HPI: Andrew Larsen is a pleasant 81 y.o. male with medical history significant for hypertension, type 2 diabetes mellitus, OSA on CPAP, PAD, and BMI 38, presenting to the emergency department with fatigue, lethargy, and cough.  Patient reports that he developed a mild cough yesterday and then severe fatigue last night.  Since last night, he has had difficulty performing his basic ADLs due to severe fatigue.  He reports some purulent drainage from the posterior aspect of his right thigh, noting that he has had a boil there for years that flares up occasionally as it is now.  He denies abdominal pain, vomiting, or diarrhea.  He had a prostate biopsy on 08/30/2021 and took a prophylactic antibiotic for that.  He denies dysuria or flank pain. ?  ?ED Course: Upon arrival to the ED, patient is found to be febrile to 39C, saturating well on room air, and tachycardic with stable blood pressure.  EKG features sinus tachycardia.  Chest x-ray with chronic interstitial thickening.  CTA chest negative for PE or other acute finding.  Chemistry panel notable for creatinine 1.7.  CBC features a leukocytosis to 27,200.  Lactic acid increased from 2.1-3.8.  Troponin was mildly elevated.  Blood cultures were collected and the patient was treated with acetaminophen, Rocephin, and azithromycin. ? ?Assessment & Plan: ?  ?Principal Problem: ?  Severe sepsis ([place]) ?Active Problems: ?  Chronic kidney disease, stage 3b ([place]) ?  Type II diabetes mellitus with stage 3 chronic kidney disease ([place]) ?  Benign essential HTN ?  Cough ? ?Severe sepsis, soft tissue infection right leg: Patient meets criteria for severe sepsis based on fever, tachycardia, tachypnea, leukocytosis as well as lactic acid of more than 3.  Last temperature spike was at 11:30 AM and his fever has subsided now.  Patient's only complaint is  cough and shortness of breath.  On exam he has wheezes at the bases bilaterally.  His procalcitonin is rising which indicates that he has bacterial infection brewing somewhere.Marland Kitchen  His symptoms are suggestive of pneumonia but interestingly CT angiogram of the chest as well as chest x-ray x2 were negative.  CT abdomen is negative for everything but bladder inflammation possible infection.  Daughter at bedside tells me that patient had prostatic biopsy a week before.  Patient is also now complaining of burning urination which he did not mention that before.  UA had some bacteria and leukoesterase but negative nitrites, partially indicative of infection but not impressive.  Urine culture is also growing only 60,000 E. coli.  Unsure whether this is the UTI causing the sepsis.  Nonetheless, patient clinically is improving.  He had low-grade fever of 100.8 yesterday.  Leukocytosis is improving.  Will monitor.  Repeat procalcitonin and CBC tomorrow morning.  Continue current broad-spectrum antibiotics.  Blood culture negative. ? ?AKI on CKD stage IIIa: Now creatinine back at baseline and falls under CKD stage IIIa. ? ?History of essential hypertension: Due to low blood pressure yesterday, his antihypertensives which include Lasix and losartan were held and he is only on metoprolol.  Blood pressures controlled now.  While his creatinine is still recovering, we will continue to hold Lasix and losartan. ? ?Type 2 diabetes mellitus: Hemoglobin A1c was 9% in December 2022, takes 70/30, 36 units at home which is resumed and he is also on SSI, blood sugar slightly elevated.  Will  increase to 40 units of 70/30. ? ?Elevated troponin: Slightly elevated but flat without having any chest pain or shortness of breath is likely demand ischemia in the setting of sepsis.  Monitor. ? ?Morbid obesity: Will need counseling once he is improved. ? ?Anxiety: Controlled.  Did not complain of anxiety today. ? ?Debility/deconditioning: We will  consult PT OT. ? ?DVT prophylaxis: enoxaparin (LOVENOX) injection 40 mg Start: 09/01/21 2030 ?  Code Status: Full Code  ?Family Communication: Daughter present at bedside.  Plan of care discussed with patient and his daughter in length and he/she verbalized understanding and agreed with it.  ? ?Status is: Inpatient ?Remains inpatient appropriate because: Patient with severe sepsis and sick ? ? ?Estimated body mass index is 37.66 kg/m? as calculated from the following: ?  Height as of this encounter: '5\' 11"'$  (1.803 m). ?  Weight as of this encounter: 122.5 kg. ? ?Pressure Injury 11/28/18 Buttocks Left;Medial Stage II -  Partial thickness loss of dermis presenting as a shallow open ulcer with a red, pink wound bed without slough. small pink opening to the inner buttock, round  (Active)  ?11/28/18   ?Location: Buttocks  ?Location Orientation: Left;Medial  ?Staging: Stage II -  Partial thickness loss of dermis presenting as a shallow open ulcer with a red, pink wound bed without slough.  ?Wound Description (Comments): small pink opening to the inner buttock, round   ?Present on Admission: Yes  ? ?Nutritional Assessment: ?Body mass index is 37.66 kg/m?Marland KitchenMarland Kitchen ?Seen by dietician.  I agree with the assessment and plan as outlined below: ?Nutrition Status: ?  ?  ?  ? ?. ?Skin Assessment: ?I have examined the patient's skin and I agree with the wound assessment as performed by the wound care RN as outlined below: ?Pressure Injury 11/28/18 Buttocks Left;Medial Stage II -  Partial thickness loss of dermis presenting as a shallow open ulcer with a red, pink wound bed without slough. small pink opening to the inner buttock, round  (Active)  ?11/28/18   ?Location: Buttocks  ?Location Orientation: Left;Medial  ?Staging: Stage II -  Partial thickness loss of dermis presenting as a shallow open ulcer with a red, pink wound bed without slough.  ?Wound Description (Comments): small pink opening to the inner buttock, round   ?Present on  Admission: Yes  ? ? ?Consultants:  ?None ? ?Procedures:  ?None ? ?Antimicrobials:  ?Anti-infectives (From admission, onward)  ? ? Start     Dose/Rate Route Frequency Ordered Stop  ? 09/02/21 2200  vancomycin (VANCOCIN) IVPB 1000 mg/200 mL premix       ? 1,000 mg ?200 mL/hr over 60 Minutes Intravenous Every 24 hours 09/01/21 2208    ? 09/02/21 1545  cefTRIAXone (ROCEPHIN) 2 g in sodium chloride 0.9 % 100 mL IVPB       ? 2 g ?200 mL/hr over 30 Minutes Intravenous Every 24 hours 09/01/21 2027 09/07/21 1544  ? 09/01/21 2215  vancomycin (VANCOCIN) 2,500 mg in sodium chloride 0.9 % 500 mL IVPB       ? 2,500 mg ?262.5 mL/hr over 120 Minutes Intravenous  Once 09/01/21 2208 09/02/21 0141  ? 09/01/21 2145  vancomycin (VANCOCIN) IVPB 1000 mg/200 mL premix  Status:  Discontinued       ? 1,000 mg ?200 mL/hr over 60 Minutes Intravenous  Once 09/01/21 2131 09/01/21 2205  ? 09/01/21 1545  cefTRIAXone (ROCEPHIN) 2 g in sodium chloride 0.9 % 100 mL IVPB       ? 2  g ?200 mL/hr over 30 Minutes Intravenous  Once 09/01/21 1537 09/01/21 1829  ? 09/01/21 1545  azithromycin (ZITHROMAX) 500 mg in sodium chloride 0.9 % 250 mL IVPB       ? 500 mg ?250 mL/hr over 60 Minutes Intravenous  Once 09/01/21 1537 09/01/21 2018  ? ?  ?  ? ? ?Subjective: ? ?Seen and examined.  No other complaint other than cough.  He is not bringing up any phlegm. ? ?Objective: ?Vitals:  ? 09/03/21 2113 09/03/21 2325 09/04/21 0530 09/04/21 6203  ?BP:  140/72 (!) 121/92 (!) 155/79  ?Pulse:  93 99 95  ?Resp:  (!) '22 19 17  '$ ?Temp:  99.1 ?F (37.3 ?C) 98 ?F (36.7 ?C) 98.2 ?F (36.8 ?C)  ?TempSrc:  Oral  Oral  ?SpO2: 97% 95% 100% 97%  ?Weight:      ?Height:      ? ? ?Intake/Output Summary (Last 24 hours) at 09/04/2021 1040 ?Last data filed at 09/04/2021 0600 ?Gross per 24 hour  ?Intake 1000 ml  ?Output 2550 ml  ?Net -1550 ml  ? ? ?Filed Weights  ? 09/01/21 1831  ?Weight: 122.5 kg  ? ? ?Examination: ? ?General exam: Appears calm and comfortable, morbidly obese ?Respiratory system:  Clear to auscultation. Respiratory effort normal. ?Cardiovascular system: S1 & S2 heard, RRR. No JVD, murmurs, rubs, gallops or clicks. No pedal edema. ?Gastrointestinal system: Abdomen is nondistended, soft and non

## 2021-09-04 NOTE — Evaluation (Signed)
Physical Therapy Evaluation ?Patient Details ?Name: Andrew Larsen ?MRN: [id number] ?DOB: 02-Jul-1940 ?Today's Date: 09/04/2021 ? ?History of Present Illness ? Patient is a 81 yo male presenting to the ED with fatigue, lethargy and cough on 09/01/21. Patient admitted with sepsis, with draining abscess on R leg, elevated WBC, troponin, and tachycardia on 09/01/21. PMH includes: , type 2 diabetes mellitus, OSA on CPAP, PAD, and BMI 38  ?Clinical Impression ? Pt admitted secondary to problem above with deficits below. Pt requiring mod A +2 initially for transfers, and then progressed to min A +2 for second transfer and gait. Pt demonstrating increased weakness and mild buckling in RLE. Per pt and daughter, pt was previously at a mod I level with RW. Recommending [facility] level therapies to increase independence and safety with mobility. Will continue to follow acutely.    ?   ? ?Recommendations for follow up therapy are one component of a multi-disciplinary discharge planning process, led by the attending physician.  Recommendations may be updated based on patient status, additional functional criteria and insurance authorization. ? ?Follow Up Recommendations Acute inpatient rehab (3hours/day) ? ?  ?Assistance Recommended at Discharge Frequent or constant Supervision/Assistance  ?Patient can return home with the following ? A lot of help with bathing/dressing/bathroom;A little help with walking and/or transfers;Assistance with cooking/housework;Assist for transportation;Help with stairs or ramp for entrance ? ?  ?Equipment Recommendations BSC/3in1  ?Recommendations for Other Services ?    ?  ?Functional Status Assessment Patient has had a recent decline in their functional status and demonstrates the ability to make significant improvements in function in a reasonable and predictable amount of time.  ? ?  ?Precautions / Restrictions Precautions ?Precautions: Fall ?Restrictions ?Weight Bearing Restrictions: No  ? ?  ? ?Mobility ?  Bed Mobility ?Overal bed mobility: Needs Assistance ?Bed Mobility: Supine to Sit ?  ?  ?Supine to sit: Min assist ?  ?  ?General bed mobility comments: cues to motor plan and use bed rail, min assist for safety and to elevate trunk ?  ? ?Transfers ?Overall transfer level: Needs assistance ?Equipment used: Rolling walker (2 wheels) ?Transfers: Sit to/from Stand ?Sit to Stand: Mod assist, +2 safety/equipment, +2 physical assistance ?  ?  ?  ?  ?  ?General transfer comment: attempted x1 from lowered bed surface and unable to come into standing, completing sit<>stands x2 from elevated surface, mod of 2 to complete initial stand, then min of 2 to complete second stand ?  ? ?Ambulation/Gait ?Ambulation/Gait assistance: Min assist, +2 physical assistance, +2 safety/equipment (chair follow) ?Gait Distance (Feet): 40 Feet ?Assistive device: Rolling walker (2 wheels) ?Gait Pattern/deviations: Step-through pattern, Decreased stride length ?Gait velocity: Decreased ?  ?  ?General Gait Details: Mild buckling noted in RLE throughout. Min A for steadying and chair follow as pt weak and fatiguing easily. ? ?Stairs ?  ?  ?  ?  ?  ? ?Wheelchair Mobility ?  ? ?Modified Rankin (Stroke Patients Only) ?  ? ?  ? ?Balance Overall balance assessment: Needs assistance ?Sitting-balance support: Bilateral upper extremity supported, Feet supported ?Sitting balance-Leahy Scale: Fair ?  ?  ?Standing balance support: Bilateral upper extremity supported, During functional activity, Reliant on assistive device for balance ?Standing balance-Leahy Scale: Poor ?Standing balance comment: Heavy reliance on RW ?  ?  ?  ?  ?  ?  ?  ?  ?  ?  ?  ?   ? ? ? ?Pertinent Vitals/Pain Pain Assessment ?Pain Assessment: Faces ?Faces  Pain Scale: Hurts little more ?Pain Location: chest when coughing and R leg when ambulating ?Pain Descriptors / Indicators: Discomfort, Grimacing, Guarding, Heaviness ?Pain Intervention(s): Limited activity within patient's tolerance,  Monitored during session, Repositioned  ? ? ?Home Living Family/patient expects to be discharged to:: Private residence ?Living Arrangements: Children ?Available Help at Discharge: Family (there most of the time) ?Type of Home: House ?Home Access: Stairs to enter ?Entrance Stairs-Rails: Right;Left;Can reach both ?Entrance Stairs-Number of Steps: 3 ?  ?Home Layout: Two level;Able to live on main level with bedroom/bathroom ?Home Equipment: Tub bench;Rolling Walker (2 wheels);Wheelchair - manual;Grab bars - tub/shower ?   ?  ?Prior Function Prior Level of Function : Independent/Modified Independent ?  ?  ?  ?  ?  ?  ?Mobility Comments: Uses RW for mobility tasks. ?ADLs Comments: Independent with bathing/dressing ?  ? ? ?Hand Dominance  ? Dominant Hand: Right ? ?  ?Extremity/Trunk Assessment  ? Upper Extremity Assessment ?Upper Extremity Assessment: Defer to OT evaluation ?  ? ?Lower Extremity Assessment ?Lower Extremity Assessment: Generalized weakness;RLE deficits/detail ?RLE Deficits / Details: Mild R buckling noted in RLE throughout mobility tasks ?  ? ?Cervical / Trunk Assessment ?Cervical / Trunk Assessment: Normal  ?Communication  ? Communication: No difficulties  ?Cognition Arousal/Alertness: Awake/alert ?Behavior During Therapy: [facility] for tasks assessed/performed ?Overall Cognitive Status: Impaired/Different from baseline ?Area of Impairment: Attention, Following commands, Safety/judgement, Awareness, Problem solving ?  ?  ?  ?  ?  ?  ?  ?  ?  ?Current Attention Level: Selective ?  ?Following Commands: Follows one step commands with increased time, Follows multi-step commands inconsistently, Follows one step commands inconsistently, Follows multi-step commands with increased time ?Safety/Judgement: Decreased awareness of safety, Decreased awareness of deficits ?Awareness: Intellectual ?Problem Solving: Slow processing, Decreased initiation, Difficulty sequencing, Requires verbal cues, Requires tactile  cues ?General Comments: Patient uses humor to deflect, significant increased time needed to follow all commands, needing increased assist to complete all aspects of care, but feels he is at a level to go home ?  ?  ? ?  ?General Comments General comments (skin integrity, edema, etc.): Poor insight into deficits, but VSS on 2L ? ?  ?Exercises    ? ?Assessment/Plan  ?  ?PT Assessment Patient needs continued PT services  ?PT Problem List Decreased strength;Decreased activity tolerance;Decreased balance;Decreased mobility;Decreased cognition;Decreased knowledge of use of DME;Decreased safety awareness;Decreased knowledge of precautions ? ?   ?  ?PT Treatment Interventions DME instruction;Functional mobility training;Stair training;Gait training;Therapeutic activities;Therapeutic exercise;Balance training;Patient/family education   ? ?PT Goals (Current goals can be found in the Care Plan section)  ?Acute Rehab PT Goals ?Patient Stated Goal: to go home ?PT Goal Formulation: With patient/family ?Time For Goal Achievement: 09/18/21 ?Potential to Achieve Goals: Good ? ?  ?Frequency Min 3X/week ?  ? ? ?Co-evaluation PT/OT/SLP Co-Evaluation/Treatment: Yes ?  ?  ?  ?  ? ? ?  ?AM-PAC PT "6 Clicks" Mobility  ?Outcome Measure Help needed turning from your back to your side while in a flat bed without using bedrails?: A Little ?Help needed moving from lying on your back to sitting on the side of a flat bed without using bedrails?: A Little ?Help needed moving to and from a bed to a chair (including a wheelchair)?: A Lot ?Help needed standing up from a chair using your arms (e.g., wheelchair or bedside chair)?: A Lot ?Help needed to walk in hospital room?: A Lot ?Help needed climbing 3-5 steps with a railing? : Total ?  6 Click Score: 13 ? ?  ?End of Session Equipment Utilized During Treatment: Gait belt ?Activity Tolerance: Patient tolerated treatment well ?Patient left: in chair;with call bell/phone within reach;with chair alarm  set;with family/visitor present ?Nurse Communication: Mobility status ?PT Visit Diagnosis: Unsteadiness on feet (R26.81);Muscle weakness (generalized) (M62.81);Difficulty in walking, not elsewhere classified (R26.2) ?

## 2021-09-04 NOTE — Care Management Important Message (Signed)
Important Message ? ?Patient Details  ?Name: Andrew Larsen ?MRN: [id number] ?Date of Birth: 01-24-1941 ? ? ?Medicare Important Message Given:  Yes ? ? ? ? ?[name] ?09/04/2021, 3:11 PM ?

## 2021-09-04 NOTE — TOC Initial Note (Signed)
Transition of Care (TOC) - Initial/Assessment Note  ? ? ?Patient Details  ?Name: Andrew Larsen ?MRN: [id number] ?Date of Birth: 09-10-1940 ? ?Transition of Care (TOC) CM/SW Contact:    ?Tom-Johnson, Renea Ee, RN ?Phone Number: ?09/04/2021, 4:13 PM ? ?Clinical Narrative:                 ? ?CM spoke with patient and daughter, Andrew Larsen at bedside about needs for post hospital transition. Admitted for Severe Sepsis, on IV abx.  ?From home with daughter, Andrew Larsen who assists with his care at home. Has a cane, walker, w/c, shower chair, rollator and lift chair at home.  ?PCP is Velna Hatchet, MD and uses [facility] on [address].  ?PT/OT recommending [facility]. Per [facility], it is  doubtful that patient's diagnosis would be approved for authorization due to the type of payer source. Notes other rehab option should be explored. CM awaiting PT/OT update. ? ?Expected Discharge Plan: [place] ?Barriers to Discharge: Continued Medical Work up ? ? ?Patient Goals and CMS Choice ?Patient states their goals for this hospitalization and ongoing recovery are:: To go to rehab and return home. ?CMS Medicare.gov Compare Post Acute Care list provided to:: Patient ?Choice offered to / list presented to : Patient, Adult Children Andrew Larsen) ? ?Expected Discharge Plan and Services ?Expected Discharge Plan: [place] ?  ?Discharge Planning Services: CM Consult ?  ?Living arrangements for the past 2 months: [place] ?                ?  ?  ?  ?  ?  ?  ?[place] Agency:  ([facility]) ?  ?  ?  ? ?Prior Living Arrangements/Services ?Living arrangements for the past 2 months: [place] ?Lives with:: Adult Children (Daughter, Andrew Larsen.) ?Patient language and need for interpreter reviewed:: Yes ?Do you feel safe going back to the place where you live?: Yes      ?Need for Family Participation in Patient Care: Yes (Comment) ?Care giver support system in place?: Yes (comment) ?Current home services: DME (Cane, walker, w/c, shwer chair,  rollator.) ?Criminal Activity/Legal Involvement Pertinent to Current Situation/Hospitalization: No - Comment as needed ? ?Activities of Daily Living ?Home Assistive Devices/Equipment: Cane (specify quad or straight), Walker (specify type) ?ADL Screening (condition at time of admission) ?Patient's cognitive ability adequate to safely complete daily activities?: Yes ?Is the patient deaf or have difficulty hearing?: No ?Does the patient have difficulty seeing, even when wearing glasses/contacts?: No ?Does the patient have difficulty concentrating, remembering, or making decisions?: No ?Patient able to express need for assistance with ADLs?: Yes ?Does the patient have difficulty dressing or bathing?: No ?Independently performs ADLs?: Yes (appropriate for developmental age) ?Does the patient have difficulty walking or climbing stairs?: Yes ?Weakness of Legs: Both ?Weakness of Arms/Hands: None ? ?Permission Sought/Granted ?Permission sought to share information with : Case Manager, Customer service manager, Family Supports ?Permission granted to share information with : Yes, Verbal Permission Granted ?   ?   ?   ?   ? ?Emotional Assessment ?Appearance:: Appears stated age ?Attitude/Demeanor/Rapport: Engaged, Gracious ?Affect (typically observed): Accepting, Appropriate, Calm, Hopeful ?Orientation: : Oriented to Self, Oriented to Place, Oriented to  Time, Oriented to Situation ?Alcohol / Substance Use: Not Applicable ?Psych Involvement: No (comment) ? ?Admission diagnosis:  Shortness of breath [R06.02] ?Sepsis ([place]) [A41.9] ?Fever, unspecified fever cause [R50.9] ?Patient Active Problem List  ? Diagnosis Date Noted  ? Severe sepsis ([place]) 09/01/2021  ? Cough 01/07/2019  ?  Labile blood glucose   ? SOB (shortness of breath)   ? Urinary frequency   ? Hypoalbuminemia due to protein-calorie malnutrition ([place])   ? Supplemental oxygen dependent   ? Controlled type 2 diabetes mellitus with hyperglycemia ([place])   ? Stage 3a  chronic Larsen disease (CKD) ([place])   ? Benign essential HTN   ? Physical debility 12/01/2018  ? Pressure injury of skin 11/27/2018  ? 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 Chronic Larsen disease, stage 3b ([place]) 11/03/2018  ? Type II diabetes mellitus with stage 3 chronic Larsen disease ([place]) 11/03/2018  ? Essential hypertension 11/03/2018  ? Claudication in peripheral vascular disease ([place]) 06/10/2016  ? Morbid obesity ([place]) 09/21/2015  ? Upper airway cough syndrome 09/20/2015  ? Cough variant asthma 09/20/2015  ? ?PCP:  Velna Hatchet, MD ?Pharmacy:   ?[facility] [place] ?[address] ?[place] [address] ?Phone: [phone] Fax: [phone] ? ? ? ? ?Social Determinants of Health (SDOH) Interventions ?  ? ?Readmission Risk Interventions ?   ? View : No data to display.  ?  ?  ?  ? ? ? ?

## 2021-09-04 NOTE — Progress Notes (Signed)
?   09/03/21 2105  ?Assess: MEWS Score  ?Temp (!) 100.8 ?F (38.2 ?C)  ?BP (!) 171/56  ?Pulse Rate (!) 110  ?Resp (!) 25  ?Level of Consciousness Alert  ?SpO2 98 %  ?O2 Device Nasal Cannula  ?O2 Flow Rate (L/min) 2 L/min  ?Assess: MEWS Score  ?MEWS Temp 1  ?MEWS Systolic 0  ?MEWS Pulse 1  ?MEWS RR 1  ?MEWS LOC 0  ?MEWS Score 3  ?MEWS Score Color Yellow  ?Assess: if the MEWS score is Yellow or Red  ?Were vital signs taken at a resting state? Yes  ?Focused Assessment No change from prior assessment  ?Early Detection of Sepsis Score *See Row Information* High  ?MEWS guidelines implemented *See Row Information* No, previously yellow, continue vital signs every 4 hours  ?Treat  ?MEWS Interventions Administered prn meds/treatments;Escalated (See documentation below)  ?Pain Scale 0-10  ?Pain Score 0  ?Escalate  ?MEWS: Escalate Yellow: discuss with charge nurse/RN and consider discussing with provider and RRT  ?Notify: Charge Nurse/RN  ?Name of Charge Nurse/RN Notified Charito B. RN  ?Date Charge Nurse/RN Notified 09/03/21  ?Time Charge Nurse/RN Notified 2120  ?Document  ?Progress note created (see row info) Yes  ? ? ? ?

## 2021-09-04 NOTE — Progress Notes (Signed)
Pharmacy Antibiotic Note- follow-up ? ?Andrew Larsen is a 81 y.o. male admitted on 09/01/2021 with cellulitis.  Pharmacy has been consulted for Vancomycin dosing. ? ?Plan: ?Vancomycin 1000 mg IV q24h ?Ceftriaxone 2 g IV q24h  ?Follow-up clinical status, renal function ?Follow-up cultures, LOT, narrow as able  ?Vanco peak and trough following this evening's dose scheduled for 2200 ? ?Height: '5\' 11"'$  (180.3 cm) ?Weight: 122.5 kg (270 lb) ?IBW/kg (Calculated) : 75.3 ? ?Temp (24hrs), Avg:99 ?F (37.2 ?C), Min:98 ?F (36.7 ?C), Max:100.8 ?F (38.2 ?C) ? ?Recent Labs  ?Lab 09/01/21 ?1236 09/01/21 ?1511 09/01/21 ?1555 09/01/21 ?1726 09/01/21 ?2217 09/02/21 ?0413 09/02/21 ?2353 09/03/21 ?0300 09/03/21 ?0801 09/03/21 ?1108 09/04/21 ?0446  ?WBC 23.3* 27.2*  --   --   --  25.4*  --  22.8*  --   --  15.1*  ?CREATININE 1.70*  --   --   --   --  1.82*  --  2.03*  --   --  1.47*  ?LATICACIDVEN  --   --    < > 3.8* 1.9  --  3.4*  --  1.4 1.4  --   ? < > = values in this interval not displayed.  ? ?  ?Estimated Creatinine Clearance: 55.2 mL/min (A) (by C-G formula based on SCr of 1.47 mg/dL (H)).   ? ?Allergies  ?Allergen Reactions  ? Metformin And Related Other (See Comments)  ?  Reaction?  ? Simbrinza [Brinzolamide-Brimonidine] Other (See Comments)  ?  Drainage, redness to eyes  ? ? ?Antimicrobials this admission: ?Vancomycin 5/5 >>  ?Ceftriaxone 5/5 >> [5/10] ?Azithromycin 5/5  ? ?Microbiology results: ?5/5 BCx: ngtd [D3] ?5/5 UCx: 60K E. Coli sens to CTX ? ?Thank you for allowing pharmacy to be a part of this patient?s care. ? ?Mendy Lapinsky BS, PharmD, BCPS ?Clinical Pharmacist ?09/04/2021 9:47 AM ? ?Contact: [phone] after 3 PM ? ?"Be curious, not judgmental..." -Jamal Maes ? ? ?

## 2021-09-05 DIAGNOSIS — A419 Sepsis, unspecified organism: Secondary | ICD-10-CM | POA: Diagnosis not present

## 2021-09-05 DIAGNOSIS — R6520 Severe sepsis without septic shock: Secondary | ICD-10-CM | POA: Diagnosis not present

## 2021-09-05 LAB — CBC WITH DIFFERENTIAL/PLATELET
Abs Immature Granulocytes: 0.09 10*3/uL — ABNORMAL HIGH (ref 0.00–0.07)
Basophils Absolute: 0 10*3/uL (ref 0.0–0.1)
Basophils Relative: 0 %
Eosinophils Absolute: 0.3 10*3/uL (ref 0.0–0.5)
Eosinophils Relative: 2 %
HCT: 39.8 % (ref 39.0–52.0)
Hemoglobin: 13.5 g/dL (ref 13.0–17.0)
Immature Granulocytes: 1 %
Lymphocytes Relative: 14 %
Lymphs Abs: 1.8 10*3/uL (ref 0.7–4.0)
MCH: 31.3 pg (ref 26.0–34.0)
MCHC: 33.9 g/dL (ref 30.0–36.0)
MCV: 92.1 fL (ref 80.0–100.0)
Monocytes Absolute: 1.4 10*3/uL — ABNORMAL HIGH (ref 0.1–1.0)
Monocytes Relative: 11 %
Neutro Abs: 9.2 10*3/uL — ABNORMAL HIGH (ref 1.7–7.7)
Neutrophils Relative %: 72 %
Platelets: 161 10*3/uL (ref 150–400)
RBC: 4.32 MIL/uL (ref 4.22–5.81)
RDW: 14.7 % (ref 11.5–15.5)
WBC: 12.9 10*3/uL — ABNORMAL HIGH (ref 4.0–10.5)
nRBC: 0 % (ref 0.0–0.2)

## 2021-09-05 LAB — VANCOMYCIN, PEAK: Vancomycin Pk: 21 ug/mL — ABNORMAL LOW (ref 30–40)

## 2021-09-05 LAB — BASIC METABOLIC PANEL
Anion gap: 8 (ref 5–15)
BUN: 17 mg/dL (ref 8–23)
CO2: 25 mmol/L (ref 22–32)
Calcium: 8.7 mg/dL — ABNORMAL LOW (ref 8.9–10.3)
Chloride: 102 mmol/L (ref 98–111)
Creatinine, Ser: 1.21 mg/dL (ref 0.61–1.24)
GFR, Estimated: >60 mL/min (ref >60)
Glucose, Bld: 209 mg/dL — ABNORMAL HIGH (ref 70–99)
Potassium: 4 mmol/L (ref 3.5–5.1)
Sodium: 135 mmol/L (ref 135–145)

## 2021-09-05 LAB — GLUCOSE, CAPILLARY
Glucose-Capillary: 234 mg/dL — ABNORMAL HIGH (ref 70–99)
Glucose-Capillary: 169 mg/dL — ABNORMAL HIGH (ref 70–99)
Glucose-Capillary: 107 mg/dL — ABNORMAL HIGH (ref 70–99)
Glucose-Capillary: 172 mg/dL — ABNORMAL HIGH (ref 70–99)

## 2021-09-05 LAB — MAGNESIUM: Magnesium: 1.9 mg/dL (ref 1.7–2.4)

## 2021-09-05 LAB — PROCALCITONIN: Procalcitonin: 3.26 ng/mL

## 2021-09-05 MED ORDER — INSULIN ASPART PROT & ASPART (70-30 MIX) 100 UNIT/ML ~~LOC~~ SUSP
48.0000 [IU] | Freq: Every day | SUBCUTANEOUS | Status: DC
  Administered 2021-09-05 – 2021-09-07 (×3): 48 [IU] via SUBCUTANEOUS

## 2021-09-05 MED ORDER — INSULIN ASPART PROT & ASPART (70-30 MIX) 100 UNIT/ML ~~LOC~~ SUSP: 48.0000 [IU] | Freq: Every day | SUBCUTANEOUS | Status: DC

## 2021-09-05 MED ORDER — HYDRALAZINE HCL 25 MG PO TABS: 25.0000 mg | ORAL_TABLET | Freq: Four times a day (QID) | ORAL | Status: DC | PRN

## 2021-09-05 NOTE — Progress Notes (Signed)
Physical Therapy Treatment ?Patient Details ?Name: Andrew Larsen ?MRN: [id number] ?DOB: [birth year] ?Today's Date: 09/05/2021 ? ? ?History of Present Illness Patient is a 81 yo male presenting to the ED with fatigue, lethargy and cough on 09/01/21. Patient admitted with sepsis, with draining abscess on R leg, elevated WBC, troponin, and tachycardia on 09/01/21. PMH includes: , type 2 diabetes mellitus, OSA on CPAP, PAD, and BMI 38 ? ?  ?PT Comments  ? ? Continuing work on functional mobility and activity tolerance;  Session focused on progressive amb, and also monitoring O2 sats off of supplemental O2; Pt needs heavy mod assist for hallway ambulation with RW; tends push RW too far in front of him, and would fall forward without moderate physical assist for control; chair behind for safety, and to boost pt's confidence for increased amb; Pt did not remember much re: yesterday's session; Continue to strongly recommend SNF for post-acute rehab to maximize independence and safety with mobility and ADLs  ?Recommendations for follow up therapy are one component of a multi-disciplinary discharge planning process, led by the attending physician.  Recommendations may be updated based on patient status, additional functional criteria and insurance authorization. ? ?Follow Up Recommendations ? Acute inpatient rehab (3hours/day) (Post-acute rehab at SNF of [facility] is not available to him) ?  ?  ?Assistance Recommended at Discharge Frequent or constant Supervision/Assistance  ?Patient can return home with the following A lot of help with bathing/dressing/bathroom;A little help with walking and/or transfers;Assistance with cooking/housework;Assist for transportation;Help with stairs or ramp for entrance ?  ?Equipment Recommendations ? BSC/3in1 (Wide RW)  ?  ?Recommendations for Other Services   ? ? ?  ?Precautions / Restrictions Precautions ?Precautions: Fall  ?  ? ?Mobility ? Bed Mobility ?Overal bed mobility: Needs Assistance ?Bed  Mobility: Supine to Sit ?  ?  ?Supine to sit: Min assist ?  ?  ?General bed mobility comments: cues to motor plan and use bed rail, min assist for safety and to elevate trunk ?  ? ?Transfers ?Overall transfer level: Needs assistance ?Equipment used: Rolling walker (2 wheels) ?Transfers: Sit to/from Stand ?Sit to Stand: Mod assist ?  ?  ?  ?  ?  ?General transfer comment: Light mod assist to power up ?  ? ?Ambulation/Gait ?Ambulation/Gait assistance: Mod assist, +2 safety/equipment ?Gait Distance (Feet): 50 Feet ?Assistive device: Rolling walker (2 wheels) ?Gait Pattern/deviations: Step-through pattern, Decreased stride length ?  ?  ?  ?General Gait Details: Needed heavy mod assist for RW control due to heavy forward push, and RW getting too far ahead of him; Max multimodal cues to upright  and RW proximityNo overt knee buckling noted this walk ? ? ?Stairs ?  ?  ?  ?  ?  ? ? ?Wheelchair Mobility ?  ? ?Modified Rankin (Stroke Patients Only) ?  ? ? ?  ?Balance   ?  ?Sitting balance-Leahy Scale: Fair ?  ?  ?  ?Standing balance-Leahy Scale: Poor ?  ?  ?  ?  ?  ?  ?  ?  ?  ?  ?  ?  ?  ? ?  ?Cognition Arousal/Alertness: Awake/alert ?Behavior During Therapy: [facility] for tasks assessed/performed ?Overall Cognitive Status: Impaired/Different from baseline ?Area of Impairment: Attention, Following commands, Safety/judgement, Awareness, Problem solving ?  ?  ?  ?  ?  ?  ?  ?  ?  ?Current Attention Level: Selective ?  ?Following Commands: Follows one step commands inconsistently ?Safety/Judgement: Decreased awareness of safety, Decreased  awareness of deficits ?Awareness: Intellectual ?Problem Solving: Slow processing, Decreased initiation, Difficulty sequencing, Requires verbal cues, Requires tactile cues ?General Comments: Patient uses humor to deflect, significant increased time needed to follow all commands, needing increased assist to complete all aspects of care, but feels he is at a level to go home ?  ?  ? ?  ?Exercises   ? ?   ?General Comments General comments (skin integrity, edema, etc.): Session conducted on Room Air and O2 sats remained greater than or equal to 93% ?  ?  ? ?Pertinent Vitals/Pain Pain Assessment ?Pain Assessment: No/denies pain ?Pain Intervention(s): Monitored during session  ? ? ?Home Living   ?  ?  ?  ?  ?  ?  ?  ?  ?  ?   ?  ?Prior Function    ?  ?  ?   ? ?PT Goals (current goals can now be found in the care plan section) Acute Rehab PT Goals ?Patient Stated Goal: to go home ?PT Goal Formulation: With patient/family ?Time For Goal Achievement: 09/18/21 ?Potential to Achieve Goals: Good ?Progress towards PT goals: Progressing toward goals ? ?  ?Frequency ? ? ? Min 3X/week ? ? ? ?  ?PT Plan Current plan remains appropriate  ? ? ?Co-evaluation   ?  ?  ?  ?  ? ?  ?AM-PAC PT "6 Clicks" Mobility   ?Outcome Measure ? Help needed turning from your back to your side while in a flat bed without using bedrails?: A Little ?Help needed moving from lying on your back to sitting on the side of a flat bed without using bedrails?: A Little ?Help needed moving to and from a bed to a chair (including a wheelchair)?: A Lot ?Help needed standing up from a chair using your arms (e.g., wheelchair or bedside chair)?: A Lot ?Help needed to walk in hospital room?: Total ?Help needed climbing 3-5 steps with a railing? : Total ?6 Click Score: 12 ? ?  ?End of Session Equipment Utilized During Treatment: Gait belt ?Activity Tolerance: Patient tolerated treatment well ?Patient left: in chair;with call bell/phone within reach;with chair alarm set ?Nurse Communication: Mobility status ?PT Visit Diagnosis: Unsteadiness on feet (R26.81);Muscle weakness (generalized) (M62.81);Difficulty in walking, not elsewhere classified (R26.2) ?  ? ? ?Time: 7322-0254 ?PT Time Calculation (min) (ACUTE ONLY): 22 min ? ?Charges:  $Gait Training: 8-22 mins          ?          ? ?Roney Marion, PT  ?Acute Rehabilitation Services ?Office [phone] ? ? ? ?Colletta [place] ?09/05/2021, 3:28 PM ? ?

## 2021-09-05 NOTE — Progress Notes (Signed)
?PROGRESS NOTE ? ? ? ?[name]  [name]:[id number] DOB: 04-30-1941 DOA: 09/01/2021 ?PCP: Velna Hatchet, MD ? ? ?Brief Narrative:  ?HPI: Andrew Larsen is a pleasant 81 y.o. male with medical history significant for hypertension, type 2 diabetes mellitus, OSA on CPAP, PAD, and BMI 38, presenting to the emergency department with fatigue, lethargy, and cough.  Patient reports that he developed a mild cough yesterday and then severe fatigue last night.  Since last night, he has had difficulty performing his basic ADLs due to severe fatigue.  He reports some purulent drainage from the posterior aspect of his right thigh, noting that he has had a boil there for years that flares up occasionally as it is now.  He denies abdominal pain, vomiting, or diarrhea.  He had a prostate biopsy on 08/30/2021 and took a prophylactic antibiotic for that.  He denies dysuria or flank pain. ?  ?ED Course: Upon arrival to the ED, patient is found to be febrile to 39C, saturating well on room air, and tachycardic with stable blood pressure.  EKG features sinus tachycardia.  Chest x-ray with chronic interstitial thickening.  CTA chest negative for PE or other acute finding.  Chemistry panel notable for creatinine 1.7.  CBC features a leukocytosis to 27,200.  Lactic acid increased from 2.1-3.8.  Troponin was mildly elevated.  Blood cultures were collected and the patient was treated with acetaminophen, Rocephin, and azithromycin. ? ?Assessment & Plan: ?  ?Principal Problem: ?  Severe sepsis ([place]) ?Active Problems: ?  Chronic kidney disease, stage 3b ([place]) ?  Type II diabetes mellitus with stage 3 chronic kidney disease ([place]) ?  Benign essential HTN ?  Cough ? ?Severe sepsis, soft tissue infection right leg: Patient meets criteria for severe sepsis based on fever, tachycardia, tachypnea, leukocytosis as well as lactic acid of more than 3.  Last temperature spike was at 2105 of 100.8 on 09/03/2021.  And his fever has subsided now.  Cough is  improving.  On my exam, there is no drainage or any erythema on the thigh since the admission.  CT right lower extremity was also negative.  He had thorough work-up including CT chest abdomen and pelvis.  Other than possible inflammation of the bladder, no other source of infection.  UA partially indicative of infection.  With him having history of prostate biopsy last week followed by symptoms of dysuria, UTI seems to be the most likely source of infection.  Urine culture is growing E. coli but only 60,000 colonies.  No evidence of MRSA, will discontinue vancomycin but continue Rocephin.  Will transition to oral antibiotics at the time of discharge.  Patient is improving, now afebrile, leukocytosis as well as procalcitonin improving. ? ?AKI on CKD stage IIIa: Creatinine normal and better than his baseline. ? ?History of essential hypertension: Due to low blood pressure yesterday, his antihypertensives which include Lasix and losartan were held and he is only on metoprolol.  Blood pressure reasonably controlled.  We will continue current medications. ? ?Type 2 diabetes mellitus: Hemoglobin A1c was 9% in December 2022, takes 70/30, 36 units at home which is resumed and he is also on SSI, blood sugar slightly elevated.  Will increase to 48 units of 70/30. ? ?Elevated troponin: Slightly elevated but flat without having any chest pain or shortness of breath is likely demand ischemia in the setting of sepsis.  Monitor. ? ?Morbid obesity: Weight loss counseled. ? ?Anxiety: Controlled.  Did not complain of anxiety today. ? ?Debility/deconditioning: PT  OT consulted and they recommended acute inpatient rehab however per rehab, he does not qualify for them, discussed need of SNF with the patient and his daughter at the bedside, they are agreeable.  TOC informed. ? ?DVT prophylaxis: enoxaparin (LOVENOX) injection 40 mg Start: 09/01/21 2030 ?  Code Status: Full Code  ?Family Communication: Daughter present at bedside.  Plan of  care discussed with patient and his daughter in length and he/she verbalized understanding and agreed with it.  ? ?Status is: Inpatient ?Remains inpatient appropriate because: Patient with severe sepsis and sick ? ? ?Estimated body mass index is 38.2 kg/m? as calculated from the following: ?  Height as of this encounter: '5\' 11"'$  (1.803 m). ?  Weight as of this encounter: 124.2 kg. ? ?Pressure Injury 11/28/18 Buttocks Left;Medial Stage II -  Partial thickness loss of dermis presenting as a shallow open ulcer with a red, pink wound bed without slough. small pink opening to the inner buttock, round  (Active)  ?11/28/18   ?Location: Buttocks  ?Location Orientation: Left;Medial  ?Staging: Stage II -  Partial thickness loss of dermis presenting as a shallow open ulcer with a red, pink wound bed without slough.  ?Wound Description (Comments): small pink opening to the inner buttock, round   ?Present on Admission: Yes  ? ?Nutritional Assessment: ?Body mass index is 38.2 kg/m?Marland KitchenMarland Kitchen ?Seen by dietician.  I agree with the assessment and plan as outlined below: ?Nutrition Status: ?  ?  ?  ? ?. ?Skin Assessment: ?I have examined the patient's skin and I agree with the wound assessment as performed by the wound care RN as outlined below: ?Pressure Injury 11/28/18 Buttocks Left;Medial Stage II -  Partial thickness loss of dermis presenting as a shallow open ulcer with a red, pink wound bed without slough. small pink opening to the inner buttock, round  (Active)  ?11/28/18   ?Location: Buttocks  ?Location Orientation: Left;Medial  ?Staging: Stage II -  Partial thickness loss of dermis presenting as a shallow open ulcer with a red, pink wound bed without slough.  ?Wound Description (Comments): small pink opening to the inner buttock, round   ?Present on Admission: Yes  ? ? ?Consultants:  ?None ? ?Procedures:  ?None ? ?Antimicrobials:  ?Anti-infectives (From admission, onward)  ? ? Start     Dose/Rate Route Frequency Ordered Stop  ?  09/02/21 2200  vancomycin (VANCOCIN) IVPB 1000 mg/200 mL premix  Status:  Discontinued       ? 1,000 mg ?200 mL/hr over 60 Minutes Intravenous Every 24 hours 09/01/21 2208 09/05/21 0804  ? 09/02/21 1545  cefTRIAXone (ROCEPHIN) 2 g in sodium chloride 0.9 % 100 mL IVPB       ? 2 g ?200 mL/hr over 30 Minutes Intravenous Every 24 hours 09/01/21 2027 09/07/21 1544  ? 09/01/21 2215  vancomycin (VANCOCIN) 2,500 mg in sodium chloride 0.9 % 500 mL IVPB       ? 2,500 mg ?262.5 mL/hr over 120 Minutes Intravenous  Once 09/01/21 2208 09/02/21 0141  ? 09/01/21 2145  vancomycin (VANCOCIN) IVPB 1000 mg/200 mL premix  Status:  Discontinued       ? 1,000 mg ?200 mL/hr over 60 Minutes Intravenous  Once 09/01/21 2131 09/01/21 2205  ? 09/01/21 1545  cefTRIAXone (ROCEPHIN) 2 g in sodium chloride 0.9 % 100 mL IVPB       ? 2 g ?200 mL/hr over 30 Minutes Intravenous  Once 09/01/21 1537 09/01/21 1829  ? 09/01/21 1545  azithromycin (ZITHROMAX)  500 mg in sodium chloride 0.9 % 250 mL IVPB       ? 500 mg ?250 mL/hr over 60 Minutes Intravenous  Once 09/01/21 1537 09/01/21 2018  ? ?  ?  ? ? ?Subjective: ? ?Patient seen and examined.  He states that he is getting better, minimal cough.  No other complaint.  Daughter at the bedside. ? ?Objective: ?Vitals:  ? 09/04/21 2012 09/05/21 9983 09/05/21 0657 09/05/21 0945  ?BP: (!) 151/87 (!) 166/95  (!) 161/79  ?Pulse: 87 83  87  ?Resp: '18 18  15  '$ ?Temp: 98.4 ?F (36.9 ?C) 98.4 ?F (36.9 ?C)  98.2 ?F (36.8 ?C)  ?TempSrc: Oral Oral  Oral  ?SpO2: 98% 95%  98%  ?Weight:   124.2 kg   ?Height:      ? ? ?Intake/Output Summary (Last 24 hours) at 09/05/2021 1015 ?Last data filed at 09/05/2021 0700 ?Gross per 24 hour  ?Intake 1116.85 ml  ?Output 1250 ml  ?Net -133.15 ml  ? ? ?Filed Weights  ? 09/01/21 1831 09/05/21 0657  ?Weight: 122.5 kg 124.2 kg  ? ? ?Examination: ? ?General exam: Appears calm and comfortable, morbidly obese ?Respiratory system: The breath sounds with end expiratory wheezes at the bases, minimal rhonchi  at the right base. Respiratory effort normal. ?Cardiovascular system: S1 & S2 heard, RRR. No JVD, murmurs, rubs, gallops or clicks. No pedal edema. ?Gastrointestinal system: Abdomen is nondistended, soft and

## 2021-09-05 NOTE — Progress Notes (Signed)
Pt. Refused cpap. 

## 2021-09-05 NOTE — NC FL2 (Signed)
?[place] MEDICAID FL2 LEVEL OF CARE SCREENING TOOL  ?  ? ?IDENTIFICATION  ?Patient Name: ?Andrew Larsen Birthdate: November 28, 1940 Sex: male Admission Date (Current Location): ?09/01/2021  ?[place] and [place] Number: ? [place] ?  Facility and Address:  ?[facility] [place], [place] [address], [place], [place] [address] ?     Provider Number: ?[id number]  ?Attending Physician Name and Address:  ?Darliss Cheney, MD ? Relative Name and Phone Number:  ?Andrew Larsen (Daughter)   [phone] ?   ?Current Level of Care: ?Hospital Recommended Level of Care: ?[place] Prior Approval Number: ?  ? ?Date Approved/Denied: ?  PASRR Number: ?[id number] ? ?Discharge Plan: ?SNF ?  ? ?Current Diagnoses: ?Patient Active Problem List  ? Diagnosis Date Noted  ? Severe sepsis ([place]) 09/01/2021  ? Cough 01/07/2019  ? Labile blood glucose   ? SOB (shortness of breath)   ? Urinary frequency   ? Hypoalbuminemia due to protein-calorie malnutrition ([place])   ? Supplemental oxygen dependent   ? Controlled type 2 diabetes mellitus with hyperglycemia ([place])   ? Stage 3a chronic kidney disease (CKD) ([place])   ? Benign essential HTN   ? Physical debility 12/01/2018  ? Pressure injury of skin 11/27/2018  ? 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 Chronic kidney disease, stage 3b ([place]) 11/03/2018  ? Type II diabetes mellitus with stage 3 chronic kidney disease ([place]) 11/03/2018  ? Essential hypertension 11/03/2018  ? Claudication in peripheral vascular disease ([place]) 06/10/2016  ? Morbid obesity ([place]) 09/21/2015  ? Upper airway cough syndrome 09/20/2015  ? Cough variant asthma 09/20/2015  ? ? ?Orientation RESPIRATION BLADDER Height & Weight   ?  ?Self, Time, Situation, Place ? Normal Incontinent Weight: 273 lb 14.4 oz (124.2 kg) ?Height:  '5\' 11"'$  (180.3 cm)  ?BEHAVIORAL SYMPTOMS/MOOD NEUROLOGICAL BOWEL NUTRITION STATUS  ?    Incontinent Diet (see d/c summary)  ?AMBULATORY STATUS COMMUNICATION OF NEEDS Skin   ?Extensive Assist Verbally  Normal ?  ?  ?  ?    ?     ?     ? ? ?Personal Care Assistance Level of Assistance  ?Bathing, Feeding, Dressing Bathing Assistance: Maximum assistance ?Feeding assistance: Independent ?Dressing Assistance: Maximum assistance ?   ? ?Functional Limitations Info  ?Sight, Hearing, Speech Sight Info: Impaired ?Hearing Info: Adequate ?Speech Info: Adequate  ? ? ?SPECIAL CARE FACTORS FREQUENCY  ?OT (By licensed OT), PT (By licensed PT)   ?  ?PT Frequency: 5x/ week ?OT Frequency: 5x/ week ?  ?  ?  ?   ? ? ?Contractures Contractures Info: Not present  ? ? ?Additional Factors Info  ?Insulin Sliding Scale, Allergies, Code Status Code Status Info: Full ?Allergies Info: Metformin And Related   Simbrinza (Brinzolamide-brimonidine) ?  ?Insulin Sliding Scale Info: see d/c med list ?  ?   ? ?Current Medications (09/05/2021):  This is the current hospital active medication list ?Current Facility-Administered Medications  ?Medication Dose Route Frequency Provider Last Rate Last Admin  ? acetaminophen (TYLENOL) tablet 650 mg  650 mg Oral Q6H PRN Vianne Bulls, MD   650 mg at 09/03/21 2154  ? Or  ? acetaminophen (TYLENOL) suppository 650 mg  650 mg Rectal Q6H PRN Opyd, Ilene Qua, MD      ? aspirin chewable tablet 81 mg  81 mg Oral QHS Darliss Cheney, MD   81 mg at 09/04/21 2101  ? benzonatate (TESSALON) capsule 100 mg  100 mg Oral TID PRN Shalhoub, Sherryll Burger, MD  100 mg at 09/03/21 2153  ? cefTRIAXone (ROCEPHIN) 2 g in sodium chloride 0.9 % 100 mL IVPB  2 g Intravenous Q24H Opyd, Ilene Qua, MD 200 mL/hr at 09/04/21 1515 Infusion Verify at 09/04/21 1515  ? dorzolamide (TRUSOPT) 2 % ophthalmic solution 1 drop  1 drop Right Eye BID Darliss Cheney, MD   1 drop at 09/05/21 0827  ? enoxaparin (LOVENOX) injection 40 mg  40 mg Subcutaneous Q24H Opyd, Ilene Qua, MD   40 mg at 09/04/21 2059  ? ezetimibe (ZETIA) tablet 10 mg  10 mg Oral QPC supper Darliss Cheney, MD   10 mg at 09/04/21 1701  ? famotidine (PEPCID) tablet 20 mg  20 mg Oral QHS Darliss Cheney, MD   20 mg at 09/04/21 2100  ? folic acid (FOLVITE) tablet 1 mg  1 mg Oral Daily Darliss Cheney, MD   1 mg at 09/05/21 0827  ? gabapentin (NEURONTIN) capsule 300 mg  300 mg Oral TID Darliss Cheney, MD   300 mg at 09/05/21 0827  ? hydrALAZINE (APRESOLINE) tablet 25 mg  25 mg Oral Q6H PRN Darliss Cheney, MD      ? insulin aspart (novoLOG) injection 0-5 Units  0-5 Units Subcutaneous QHS Vianne Bulls, MD   2 Units at 09/04/21 2133  ? insulin aspart (novoLOG) injection 0-9 Units  0-9 Units Subcutaneous TID WC Opyd, Ilene Qua, MD   2 Units at 09/05/21 1208  ? insulin aspart protamine- aspart (NOVOLOG MIX 70/30) injection 48 Units  48 Units Subcutaneous Q breakfast Reome, Earle J, RPH   48 Units at 09/05/21 7829  ? ipratropium-albuterol (DUONEB) 0.5-2.5 (3) MG/3ML nebulizer solution 3 mL  3 mL Nebulization Q6H PRN Darliss Cheney, MD   3 mL at 09/03/21 2111  ? metoprolol tartrate (LOPRESSOR) tablet 25 mg  25 mg Oral BID Darliss Cheney, MD   25 mg at 09/05/21 0827  ? ondansetron (ZOFRAN) tablet 4 mg  4 mg Oral Q6H PRN Opyd, Ilene Qua, MD      ? Or  ? ondansetron (ZOFRAN) injection 4 mg  4 mg Intravenous Q6H PRN Opyd, Ilene Qua, MD      ? pantoprazole (PROTONIX) EC tablet 40 mg  40 mg Oral QAC breakfast Darliss Cheney, MD   40 mg at 09/05/21 0827  ? sodium chloride flush (NS) 0.9 % injection 3 mL  3 mL Intravenous Q12H Opyd, Ilene Qua, MD   3 mL at 09/05/21 0830  ? tamsulosin (FLOMAX) capsule 0.4 mg  0.4 mg Oral Daily Pahwani, Einar Grad, MD   0.4 mg at 09/05/21 0827  ? timolol (TIMOPTIC) 0.5 % ophthalmic solution 1 drop  1 drop Right Eye BID Darliss Cheney, MD   1 drop at 09/05/21 0827  ? zinc sulfate capsule 220 mg  220 mg Oral Daily Darliss Cheney, MD   220 mg at 09/05/21 0827  ? zolpidem (AMBIEN) tablet 5 mg  5 mg Oral QHS PRN Darliss Cheney, MD   5 mg at 09/04/21 2101  ? ? ? ?Discharge Medications: ?Please see discharge summary for a list of discharge medications. ? ?Relevant Imaging Results: ? ?Relevant Lab  Results: ? ? ?Additional Information ?SSN: [id number];  Patient reports receiving at least 4 COVID vaccines; 5'11"  273lbs; ? ?Paulene Floor Oree Hislop, LCSWA ? ? ? ? ?

## 2021-09-05 NOTE — Progress Notes (Signed)
Mobility Specialist Progress Note  ? ? 09/05/21 1513  ?Mobility  ?Activity Transferred from chair to bed  ?Level of Assistance Minimal assist, patient does 75% or more  ?Assistive Device Front wheel walker  ?Distance Ambulated (ft) 5 ft  ?Activity Response Tolerated well  ?$Mobility charge 1 Mobility  ? ?Left with call bell in reach and visitor present.  ? ?Hildred Alamin ?Mobility Specialist  ?Primary: 5N M.S. Phone: [phone] ?Secondary: 6N M.S. Phone: [phone] ?  ?

## 2021-09-05 NOTE — TOC Progression Note (Addendum)
Transition of Care (TOC) - Initial/Assessment Note  ? ? ?Patient Details  ?Name: Andrew Larsen ?MRN: [id number] ?Date of Birth: 06-30-1940 ? ?Transition of Care (TOC) CM/SW Contact:    ?Paulene Floor Sanaz Scarlett, LCSWA ?Phone Number: ?09/05/2021, 10:54 AM ? ?Clinical Narrative:                 ?CSW spoke with the patient's daughter, [name].  The family and patient are in agreement with the patient going to a SNF.  The facility preferences are [facility], [facility], and [facility].  CSW explained the insurance authorization process and will give the family the Medicare.com list of facilities.   ? ?15:00- Patient's daughter contacted [facility] and was informed that they will offer a bed to the patient.  CSW will follow up with facility. ? ?Expected Discharge Plan: [place] ?Barriers to Discharge: Continued Medical Work up ? ? ?Patient Goals and CMS Choice ?Patient states their goals for this hospitalization and ongoing recovery are:: To go to rehab and return home. ?CMS Medicare.gov Compare Post Acute Care list provided to:: Patient ?Choice offered to / list presented to : Patient, Adult Children [name]) ? ?Expected Discharge Plan and Services ?Expected Discharge Plan: [place] ?  ?Discharge Planning Services: CM Consult ?  ?Living arrangements for the past 2 months: [place] ?                ?  ?  ?  ?  ?  ?  ?[place] Agency:  (AIR) ?  ?  ?  ? ?Prior Living Arrangements/Services ?Living arrangements for the past 2 months: [place] ?Lives with:: Adult Children (Daughter, [name].) ?Patient language and need for interpreter reviewed:: Yes ?Do you feel safe going back to the place where you live?: Yes      ?Need for Family Participation in Patient Care: Yes (Comment) ?Care giver support system in place?: Yes (comment) ?Current home services: DME (Cane, walker, w/c, shwer chair, rollator.) ?Criminal Activity/Legal Involvement Pertinent to Current Situation/Hospitalization: No - Comment as  needed ? ?Activities of Daily Living ?Home Assistive Devices/Equipment: Cane (specify quad or straight), Walker (specify type) ?ADL Screening (condition at time of admission) ?Patient's cognitive ability adequate to safely complete daily activities?: Yes ?Is the patient deaf or have difficulty hearing?: No ?Does the patient have difficulty seeing, even when wearing glasses/contacts?: No ?Does the patient have difficulty concentrating, remembering, or making decisions?: No ?Patient able to express need for assistance with ADLs?: Yes ?Does the patient have difficulty dressing or bathing?: No ?Independently performs ADLs?: Yes (appropriate for developmental age) ?Does the patient have difficulty walking or climbing stairs?: Yes ?Weakness of Legs: Both ?Weakness of Arms/Hands: None ? ?Permission Sought/Granted ?Permission sought to share information with : Case Manager, Customer service manager, Family Supports ?Permission granted to share information with : Yes, Verbal Permission Granted ?   ?   ?   ?   ? ?Emotional Assessment ?Appearance:: Appears stated age ?Attitude/Demeanor/Rapport: Engaged, Gracious ?Affect (typically observed): Accepting, Appropriate, Calm, Hopeful ?Orientation: : Oriented to Self, Oriented to Place, Oriented to  Time, Oriented to Situation ?Alcohol / Substance Use: Not Applicable ?Psych Involvement: No (comment) ? ?Admission diagnosis:  Shortness of breath [R06.02] ?Sepsis ([place]) [A41.9] ?Fever, unspecified fever cause [R50.9] ?Patient Active Problem List  ? Diagnosis Date Noted  ? Severe sepsis ([place]) 09/01/2021  ? Cough 01/07/2019  ? Labile blood glucose   ? SOB (shortness of breath)   ? Urinary frequency   ?  Hypoalbuminemia due to protein-calorie malnutrition ([place])   ? Supplemental oxygen dependent   ? Controlled type 2 diabetes mellitus with hyperglycemia ([place])   ? Stage 3a chronic kidney disease (CKD) ([place])   ? Benign essential HTN   ? Physical debility 12/01/2018  ? Pressure injury of  skin 11/27/2018  ? 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 Chronic kidney disease, stage 3b ([place]) 11/03/2018  ? Type II diabetes mellitus with stage 3 chronic kidney disease ([place]) 11/03/2018  ? Essential hypertension 11/03/2018  ? Claudication in peripheral vascular disease ([place]) 06/10/2016  ? Morbid obesity ([place]) 09/21/2015  ? Upper airway cough syndrome 09/20/2015  ? Cough variant asthma 09/20/2015  ? ?PCP:  Velna Hatchet, MD ?Pharmacy:   ?[facility] [place], [place] [name] ?[place] ?[place] [address] ?Phone: [phone] Fax: [phone] ? ? ? ? ?Social Determinants of Health (SDOH) Interventions ?  ? ?Readmission Risk Interventions ?   ? View : No data to display.  ?  ?  ?  ? ? ? ?

## 2021-09-06 DIAGNOSIS — A419 Sepsis, unspecified organism: Secondary | ICD-10-CM

## 2021-09-06 DIAGNOSIS — R6520 Severe sepsis without septic shock: Secondary | ICD-10-CM | POA: Diagnosis not present

## 2021-09-06 DIAGNOSIS — N390 Urinary tract infection, site not specified: Secondary | ICD-10-CM

## 2021-09-06 LAB — GLUCOSE, CAPILLARY
Glucose-Capillary: 145 mg/dL — ABNORMAL HIGH (ref 70–99)
Glucose-Capillary: 161 mg/dL — ABNORMAL HIGH (ref 70–99)
Glucose-Capillary: 174 mg/dL — ABNORMAL HIGH (ref 70–99)
Glucose-Capillary: 178 mg/dL — ABNORMAL HIGH (ref 70–99)
Glucose-Capillary: 95 mg/dL (ref 70–99)

## 2021-09-06 LAB — CBC WITH DIFFERENTIAL/PLATELET
Abs Immature Granulocytes: 0.1 10*3/uL — ABNORMAL HIGH (ref 0.00–0.07)
Basophils Absolute: 0 10*3/uL (ref 0.0–0.1)
Basophils Relative: 1 %
Eosinophils Absolute: 0.2 10*3/uL (ref 0.0–0.5)
Eosinophils Relative: 3 %
HCT: 44.3 % (ref 39.0–52.0)
Hemoglobin: 14.5 g/dL (ref 13.0–17.0)
Immature Granulocytes: 1 %
Lymphocytes Relative: 19 %
Lymphs Abs: 1.6 10*3/uL (ref 0.7–4.0)
MCH: 30.3 pg (ref 26.0–34.0)
MCHC: 32.7 g/dL (ref 30.0–36.0)
MCV: 92.5 fL (ref 80.0–100.0)
Monocytes Absolute: 1.1 10*3/uL — ABNORMAL HIGH (ref 0.1–1.0)
Monocytes Relative: 13 %
Neutro Abs: 5.2 10*3/uL (ref 1.7–7.7)
Neutrophils Relative %: 63 %
Platelets: 192 10*3/uL (ref 150–400)
RBC: 4.79 MIL/uL (ref 4.22–5.81)
RDW: 14.5 % (ref 11.5–15.5)
WBC: 8.2 10*3/uL (ref 4.0–10.5)
nRBC: 0 % (ref 0.0–0.2)

## 2021-09-06 LAB — BASIC METABOLIC PANEL
Anion gap: 8 (ref 5–15)
BUN: 14 mg/dL (ref 8–23)
CO2: 28 mmol/L (ref 22–32)
Calcium: 9 mg/dL (ref 8.9–10.3)
Chloride: 101 mmol/L (ref 98–111)
Creatinine, Ser: 1.15 mg/dL (ref 0.61–1.24)
GFR, Estimated: >60 mL/min (ref >60)
Glucose, Bld: 225 mg/dL — ABNORMAL HIGH (ref 70–99)
Potassium: 4.2 mmol/L (ref 3.5–5.1)
Sodium: 137 mmol/L (ref 135–145)

## 2021-09-06 LAB — PROCALCITONIN: Procalcitonin: 1.34 ng/mL

## 2021-09-06 LAB — CULTURE, BLOOD (ROUTINE X 2)
Culture: NO GROWTH
Culture: NO GROWTH
Special Requests: ADEQUATE
Special Requests: ADEQUATE

## 2021-09-06 MED ORDER — CEFDINIR 300 MG PO CAPS
300.0000 mg | ORAL_CAPSULE | Freq: Two times a day (BID) | ORAL | Status: DC
Start: 2021-09-06 — End: 2021-09-07
  Administered 2021-09-06 – 2021-09-07 (×2): 300 mg via ORAL
  Filled 2021-09-06 (×2): qty 1

## 2021-09-06 MED ORDER — CEFDINIR 300 MG PO CAPS: 300.0000 mg | ORAL_CAPSULE | Freq: Two times a day (BID) | ORAL | 0 refills | Status: AC

## 2021-09-06 NOTE — Discharge Summary (Addendum)
PatientPhysician Discharge Summary  ?CALDWELL KRONENBERGER NTI:[id number] DOB: [birth year] DOA: 09/01/2021 ? ?PCP: Velna Hatchet, MD ? ?Admit date: 09/01/2021 ?Discharge date: 09/07/2021 ?30 Day Unplanned Readmission Risk Score   ? ?Flowsheet Row ED to Hosp-Admission (Current) from 09/01/2021 in [facility]  ?30 Day Unplanned Readmission Risk Score (%) 13.24 Filed at 09/06/2021 1200  ? ?  ? ? This score is the patient's risk of an unplanned readmission within 30 days of being discharged (0 -100%). The score is based on dignosis, age, lab data, medications, orders, and past utilization.   ?Low:  0-14.9   Medium: 15-21.9   High: 22-29.9   Extreme: 30 and above ? ?  ? ?  ? ? ? ?Admitted From: Home ?Disposition: SNF ? ?Recommendations for Outpatient Follow-up:  ?Follow up with PCP in 1-2 weeks ?Please obtain BMP/CBC in one week ?Please follow up with your PCP on the following pending results: ?Unresulted Labs (From admission, onward)  ? ?  Start     Ordered  ? 09/08/21 0500  Creatinine, serum  (enoxaparin (LOVENOX)    CrCl >/= 30 ml/min)  Weekly,   R     ?Comments: while on enoxaparin therapy ?  ? 09/01/21 2027  ? ?  ?  ? ?  ?  ? ? ?Home Health: None ?Equipment/Devices: None ? ?Discharge Condition: Stable ?CODE STATUS: Full code ?Diet recommendation: Cardiac ? ?Subjective: Seen and examined.  Daughter at the bedside.  He feels better.  Very minimal cough but much improved.  No other complaint.  He is eager to get out of the hospital today. ? ?Brief/Interim Summary: Andrew Larsen is a pleasant 81 y.o. male with medical history significant for hypertension, type 2 diabetes mellitus, OSA on CPAP, PAD, and BMI 38, presented to the emergency department with fatigue, lethargy, and cough. He also reported some purulent drainage from the posterior aspect of his right thigh, noting that he has had a boil there for years that flares up occasionally.  He denies abdominal pain, vomiting, or diarrhea.  He had a prostate biopsy  on 08/30/2021 and took a prophylactic antibiotic for that.  He initially denied dysuria or flank pain. ?  ?Upon arrival to the ED, patient is found to be febrile to 39C, saturating well on room air, and tachycardic with stable blood pressure.  EKG features sinus tachycardia.  Chest x-ray with chronic interstitial thickening.  CTA chest negative for PE or other acute finding.  Chemistry panel notable for creatinine 1.7.  CBC features a leukocytosis to 27,200.  Lactic acid increased from 2.1-3.8.  Troponin was mildly elevated.  Blood cultures were collected and the patient was treated with acetaminophen, Rocephin, and azithromycin and admitted to hospitalist service.  Detailed hospitalization as below. ?  ?Severe sepsis secondary to complicated UTI: Patient met criteria for severe sepsis based on fever, tachycardia, tachypnea, leukocytosis as well as lactic acid of more than 3.  Last temperature spike was at 2105 of 100.8 on 09/03/2021.  And his fever has subsided now.  Cough is improving.  Initially was being presumed that his sepsis were coming from soft tissue infection of the leg however on my exam, there is no drainage or any erythema on the thigh since the admission.  CT right lower extremity was also negative.  He had thorough work-up including CT chest abdomen and pelvis.  Other than possible inflammation of the bladder, no other source of infection.  UA partially indicative of infection.  Upon further  questioning, he endorsed that he was having dysuria since he had the prostate biopsy.  With him having history of prostate biopsy last week followed by symptoms of dysuria, UTI seems to be the most likely source of infection.  Urine culture is growing E. coli but only 60,000 colonies.  No evidence of MRSA, he was on Rocephin and vancomycin, vancomycin was eventually discontinued.  E. coli is resistant to penicillin but sensitive to cephalosporins.  His blood culture done and is also improving.  This will be  considered a complicated UTI since it was due to prostate biopsy and he is a male so he will be treated for 10 to 14 days, he has received 6 days of IV antibiotics, I am discharging him on cefdinir for 5 more days.   ?  ?AKI on CKD stage IIIa: Creatinine normal and better than his baseline. ?  ?History of essential hypertension: Due to low blood pressure day before yesterday, his antihypertensives which include Lasix and losartan were held and he is only on metoprolol.  Blood pressure reasonably controlled.  We will resume all his antihypertensives now. ?  ?Type 2 diabetes mellitus: Hemoglobin A1c was 9% in December 2022, takes 70/30, 36 units at home which will be resumed. ?  ?Elevated troponin: Slightly elevated but flat without having any chest pain or shortness of breath is likely demand ischemia in the setting of sepsis.  Monitor. ?  ?Morbid obesity: Weight loss counseled. ?  ?Anxiety: Controlled.  ?  ?Debility/deconditioning: PT OT consulted and they recommended acute inpatient rehab however per rehab, he does not qualify for them, discussed need of SNF with the patient and his daughter at the bedside, they are agreeable.  TOC has arranged SNF placement, awaiting insurance authorization, if received, patient will be discharged today. ? ?Acute hypoxic respiratory failure secondary to bilateral wheezes/atelectasis: Patient appears to have atelectasis at the bases bilaterally.  He is on 2 L of oxygen.  Hopefully he will not require that.  We will do ambulatory oximetry today and based on that, will discharge on oxygen if he qualifies for that.  Incentive spirometry has been provided to him and I am strongly advised him to continue that at [facility].  CT chest and x-ray negative for pneumonia/infiltrate. ? ?PS: The patient was supposed to be discharged yesterday, discharge summary was completed however we waited for his insurance company to approve/receive authorization all day long.  When the authorization was received  it was already around 4:30 PM.  However TOC called the facility to arrange transportation however facility did not respond to the phone call and did not return the phone call.  For that reason, patient ended up staying in the hospital overnight.  Patient seen and examined this morning, there has been no change in patient's condition since yesterday, he is as a stable for discharge as he was yesterday so he will be discharged today. ? ?Discharge plan was discussed with patient and/or family member and they verbalized understanding and agreed with it.  ?Discharge Diagnoses:  ?Principal Problem: ?  Severe sepsis ([place]) ?Active Problems: ?  Chronic kidney disease, stage 3b ([place]) ?  Type II diabetes mellitus with stage 3 chronic kidney disease ([place]) ?  Benign essential HTN ?  Cough ?  Sepsis secondary to UTI [facility]) ?  Complicated UTI (urinary tract infection) ? ? ? ?Discharge Instructions ? ? ?Allergies as of 09/07/2021   ? ?   Reactions  ? Metformin And Related Other (See Comments)  ?  Reaction?  ? Simbrinza [brinzolamide-brimonidine] Other (See Comments)  ? Drainage, redness to eyes  ? ?  ? ?  ?Medication List  ?  ? ?STOP taking these medications   ? ?multivitamin capsule ?  ?oxybutynin 5 MG tablet ?Commonly known as: DITROPAN ?  ? ?  ? ?TAKE these medications   ? ?albuterol 108 (90 Base) MCG/ACT inhaler ?Commonly known as: VENTOLIN HFA ?Inhale 2 puffs into the lungs every 4 (four) hours as needed for wheezing or shortness of breath. ?  ?aspirin 81 MG chewable tablet ?Chew 81 mg by mouth at bedtime. ?  ?calcium carbonate 600 MG Tabs tablet ?Commonly known as: Calcium 600 ?Take 2 tablets (1,200 mg total) by mouth daily. ?  ?cefdinir 300 MG capsule ?Commonly known as: OMNICEF ?Take 1 capsule (300 mg total) by mouth 2 (two) times daily for 6 days. ?  ?dorzolamide 2 % ophthalmic solution ?Commonly known as: TRUSOPT ?Place 1 drop into the right eye 2 (two) times daily. ?  ?ezetimibe 10 MG tablet ?Commonly known as: ZETIA ?TAKE  1 TABLET(10 MG) BY MOUTH DAILY AFTER SUPPER ?What changed: See the new instructions. ?  ?famotidine 20 MG tablet ?Commonly known as: PEPCID ?Take 1 tablet (20 mg total) by mouth at bedtime. ?  ?folic acid 1

## 2021-09-06 NOTE — TOC Progression Note (Addendum)
Transition of Care (TOC) - Initial/Assessment Note  ? ? ?Patient Details  ?Name: Andrew Larsen ?MRN: [id number] ?Date of Birth: Sep 11, 1940 ? ?Transition of Care (TOC) CM/SW Contact:    ?Andrew Larsen, LCSWA ?Phone Number: ?09/06/2021, 9:17 AM ? ?Clinical Narrative:                 ?09:10- CSW contacted the insurance company in an attempt to begin insurance authorization.  There was no answer.  CSW left a VM requesting a returned call to begin the process. ? ?09:40-  CSW received a returned call from [place] with [employer].  Insurance authorization has been initiated.  CSW inquired about the time needed to approve as the patient is medically ready and was informed that, pending there are no issues, it may be approved today. ? ?16:35- CSW received a call that [employer] approved insurance auth. ?Transportation auth number- [id number] ?SNF auth number- [id number] ? ?16:39-  CSW attempted to contact the facility to inform them of approval and receive transfer information.  There was no answer.  CSW left a VM requesting a returned call.' ? ?CSW updated MD and patient's daughter on the above information.   ? ?TOC will continue to follow.   ? ?Expected Discharge Plan: [place] ?Barriers to Discharge: Continued Medical Work up ? ? ?Patient Goals and CMS Choice ?Patient states their goals for this hospitalization and ongoing recovery are:: To go to rehab and return home. ?CMS Medicare.gov Compare Post Acute Care list provided to:: Patient ?Choice offered to / list presented to : Patient, Adult Children Andrew Larsen) ? ?Expected Discharge Plan and Services ?Expected Discharge Plan: [place] ?  ?Discharge Planning Services: CM Consult ?  ?Living arrangements for the past 2 months: [place] ?Expected Discharge Date: 09/06/21               ?  ?  ?  ?  ?  ?  ?[place] Agency:  (AIR) ?  ?  ?  ? ?Prior Living Arrangements/Services ?Living arrangements for the past 2 months: [place] ?Lives with:: Adult Children (Daughter,  Andrew Larsen.) ?Patient language and need for interpreter reviewed:: Yes ?Do you feel safe going back to the place where you live?: Yes      ?Need for Family Participation in Patient Care: Yes (Comment) ?Care giver support system in place?: Yes (comment) ?Current home services: DME (Cane, walker, w/c, shwer chair, rollator.) ?Criminal Activity/Legal Involvement Pertinent to Current Situation/Hospitalization: No - Comment as needed ? ?Activities of Daily Living ?Home Assistive Devices/Equipment: Cane (specify quad or straight), Walker (specify type) ?ADL Screening (condition at time of admission) ?Patient's cognitive ability adequate to safely complete daily activities?: Yes ?Is the patient deaf or have difficulty hearing?: No ?Does the patient have difficulty seeing, even when wearing glasses/contacts?: No ?Does the patient have difficulty concentrating, remembering, or making decisions?: No ?Patient able to express need for assistance with ADLs?: Yes ?Does the patient have difficulty dressing or bathing?: No ?Independently performs ADLs?: Yes (appropriate for developmental age) ?Does the patient have difficulty walking or climbing stairs?: Yes ?Weakness of Legs: Both ?Weakness of Arms/Hands: None ? ?Permission Sought/Granted ?Permission sought to share information with : Case Manager, Customer service manager, Family Supports ?Permission granted to share information with : Yes, Verbal Permission Granted ?   ?   ?   ?   ? ?Emotional Assessment ?Appearance:: Appears stated age ?Attitude/Demeanor/Rapport: Engaged, Gracious ?Affect (typically observed): Accepting, Appropriate, Calm, Hopeful ?Orientation: : Oriented to Self,  Oriented to Place, Oriented to  Time, Oriented to Situation ?Alcohol / Substance Use: Not Applicable ?Psych Involvement: No (comment) ? ?Admission diagnosis:  Shortness of breath [R06.02] ?Sepsis ([place]) [A41.9] ?Fever, unspecified fever cause [R50.9] ?Patient Active Problem List  ? Diagnosis Date  Noted  ? Severe sepsis ([place]) 09/01/2021  ? Cough 01/07/2019  ? Labile blood glucose   ? SOB (shortness of breath)   ? Urinary frequency   ? Hypoalbuminemia due to protein-calorie malnutrition ([place])   ? Supplemental oxygen dependent   ? Controlled type 2 diabetes mellitus with hyperglycemia ([place])   ? Stage 3a chronic Larsen disease (CKD) ([place])   ? Benign essential HTN   ? Physical debility 12/01/2018  ? Pressure injury of skin 11/27/2018  ? 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 Chronic Larsen disease, stage 3b ([place]) 11/03/2018  ? Type II diabetes mellitus with stage 3 chronic Larsen disease ([place]) 11/03/2018  ? Essential hypertension 11/03/2018  ? Claudication in peripheral vascular disease ([place]) 06/10/2016  ? Morbid obesity ([place]) 09/21/2015  ? Upper airway cough syndrome 09/20/2015  ? Cough variant asthma 09/20/2015  ? ?PCP:  Andrew Hatchet, MD ?Pharmacy:   ?[facility] [place], [address] ?[address] ?[place] [address] ?Phone: [phone] Fax: [phone] ? ? ? ? ?Social Determinants of Health (SDOH) Interventions ?  ? ?Readmission Risk Interventions ?   ? View : No data to display.  ?  ?  ?  ? ? ? ?

## 2021-09-07 DIAGNOSIS — I5032 Chronic diastolic (congestive) heart failure: Secondary | ICD-10-CM | POA: Diagnosis not present

## 2021-09-07 DIAGNOSIS — G7241 Inclusion body myositis [IBM]: Secondary | ICD-10-CM | POA: Diagnosis not present

## 2021-09-07 DIAGNOSIS — K219 Gastro-esophageal reflux disease without esophagitis: Secondary | ICD-10-CM | POA: Diagnosis not present

## 2021-09-07 DIAGNOSIS — I5030 Unspecified diastolic (congestive) heart failure: Secondary | ICD-10-CM | POA: Diagnosis not present

## 2021-09-07 DIAGNOSIS — G4733 Obstructive sleep apnea (adult) (pediatric): Secondary | ICD-10-CM | POA: Diagnosis not present

## 2021-09-07 DIAGNOSIS — E785 Hyperlipidemia, unspecified: Secondary | ICD-10-CM | POA: Diagnosis not present

## 2021-09-07 DIAGNOSIS — I10 Essential (primary) hypertension: Secondary | ICD-10-CM | POA: Diagnosis not present

## 2021-09-07 DIAGNOSIS — R4189 Other symptoms and signs involving cognitive functions and awareness: Secondary | ICD-10-CM | POA: Diagnosis not present

## 2021-09-07 DIAGNOSIS — M6281 Muscle weakness (generalized): Secondary | ICD-10-CM | POA: Diagnosis not present

## 2021-09-07 DIAGNOSIS — N390 Urinary tract infection, site not specified: Secondary | ICD-10-CM | POA: Diagnosis not present

## 2021-09-07 DIAGNOSIS — N400 Enlarged prostate without lower urinary tract symptoms: Secondary | ICD-10-CM | POA: Diagnosis not present

## 2021-09-07 DIAGNOSIS — E1140 Type 2 diabetes mellitus with diabetic neuropathy, unspecified: Secondary | ICD-10-CM | POA: Diagnosis not present

## 2021-09-07 DIAGNOSIS — E1122 Type 2 diabetes mellitus with diabetic chronic kidney disease: Secondary | ICD-10-CM | POA: Diagnosis not present

## 2021-09-07 DIAGNOSIS — R2681 Unsteadiness on feet: Secondary | ICD-10-CM | POA: Diagnosis not present

## 2021-09-07 DIAGNOSIS — I130 Hypertensive heart and chronic kidney disease with heart failure and stage 1 through stage 4 chronic kidney disease, or unspecified chronic kidney disease: Secondary | ICD-10-CM | POA: Diagnosis not present

## 2021-09-07 DIAGNOSIS — Z7401 Bed confinement status: Secondary | ICD-10-CM | POA: Diagnosis not present

## 2021-09-07 DIAGNOSIS — R531 Weakness: Secondary | ICD-10-CM | POA: Diagnosis not present

## 2021-09-07 DIAGNOSIS — H409 Unspecified glaucoma: Secondary | ICD-10-CM | POA: Diagnosis not present

## 2021-09-07 DIAGNOSIS — J9601 Acute respiratory failure with hypoxia: Secondary | ICD-10-CM | POA: Diagnosis not present

## 2021-09-07 DIAGNOSIS — E1142 Type 2 diabetes mellitus with diabetic polyneuropathy: Secondary | ICD-10-CM | POA: Diagnosis not present

## 2021-09-07 DIAGNOSIS — A4151 Sepsis due to Escherichia coli [E. coli]: Secondary | ICD-10-CM | POA: Diagnosis not present

## 2021-09-07 DIAGNOSIS — F39 Unspecified mood [affective] disorder: Secondary | ICD-10-CM | POA: Diagnosis not present

## 2021-09-07 DIAGNOSIS — R278 Other lack of coordination: Secondary | ICD-10-CM | POA: Diagnosis not present

## 2021-09-07 DIAGNOSIS — I110 Hypertensive heart disease with heart failure: Secondary | ICD-10-CM | POA: Diagnosis not present

## 2021-09-07 DIAGNOSIS — Z741 Need for assistance with personal care: Secondary | ICD-10-CM | POA: Diagnosis not present

## 2021-09-07 DIAGNOSIS — E1151 Type 2 diabetes mellitus with diabetic peripheral angiopathy without gangrene: Secondary | ICD-10-CM | POA: Diagnosis not present

## 2021-09-07 DIAGNOSIS — N1831 Chronic kidney disease, stage 3a: Secondary | ICD-10-CM | POA: Diagnosis not present

## 2021-09-07 LAB — GLUCOSE, CAPILLARY: Glucose-Capillary: 185 mg/dL — ABNORMAL HIGH (ref 70–99)

## 2021-09-07 NOTE — Progress Notes (Signed)
DISCHARGE NOTE SNF ?Doristine Mango to be discharged Skilled nursing facility per MD order. Patient verbalized understanding. ? ?Skin clean, dry and intact without evidence of skin break down, no evidence of skin tears noted. IV catheter discontinued intact. Site without signs and symptoms of complications. Dressing and pressure applied. Pt denies pain at the site currently. No complaints noted. ? ?Patient free of lines, drains, and wounds.  ? ?Discharge packet assembled. An After Visit Summary (AVS) was printed and given to the EMS personnel. Patient escorted via stretcher and discharged to [facility] via ambulance. Report called to accepting facility; all questions and concerns addressed.  ? ?Meha Vidrine S Zaydyn Havey, RN ?_______________________________________________________________________  ?

## 2021-09-07 NOTE — TOC Progression Note (Signed)
Transition of Care (TOC) - Initial/Assessment Note  ? ? ?Patient Details  ?Name: Andrew Larsen ?MRN: [id number] ?Date of Birth: Sep 18, 1940 ? ?Transition of Care (TOC) CM/SW Contact:    ?Paulene Floor Leyani Gargus, LCSWA ?Phone Number: ?09/07/2021, 8:53 AM ? ?Clinical Narrative:                 ?CSW contacted admissions with [facility].  The facility can accept the patient today and requested that the d/c summary be updated to reflect today's date. MD notified.  CSW is awaiting the transfer information from the facility. ? ?Expected Discharge Plan: [place] ?Barriers to Discharge: Continued Medical Work up ? ? ?Patient Goals and CMS Choice ?Patient states their goals for this hospitalization and ongoing recovery are:: To go to rehab and return home. ?CMS Medicare.gov Compare Post Acute Care list provided to:: Patient ?Choice offered to / list presented to : Patient, Adult Children [name]) ? ?Expected Discharge Plan and Services ?Expected Discharge Plan: [place] ?  ?Discharge Planning Services: CM Consult ?  ?Living arrangements for the past 2 months: [place] ?Expected Discharge Date: 09/06/21               ?  ?  ?  ?  ?  ?  ?[place] Agency:  (AIR) ?  ?  ?  ? ?Prior Living Arrangements/Services ?Living arrangements for the past 2 months: [place] ?Lives with:: Adult Children (Daughter, [name].) ?Patient language and need for interpreter reviewed:: Yes ?Do you feel safe going back to the place where you live?: Yes      ?Need for Family Participation in Patient Care: Yes (Comment) ?Care giver support system in place?: Yes (comment) ?Current home services: DME (Cane, walker, w/c, shwer chair, rollator.) ?Criminal Activity/Legal Involvement Pertinent to Current Situation/Hospitalization: No - Comment as needed ? ?Activities of Daily Living ?Home Assistive Devices/Equipment: Cane (specify quad or straight), Walker (specify type) ?ADL Screening (condition at time of admission) ?Patient's cognitive  ability adequate to safely complete daily activities?: Yes ?Is the patient deaf or have difficulty hearing?: No ?Does the patient have difficulty seeing, even when wearing glasses/contacts?: No ?Does the patient have difficulty concentrating, remembering, or making decisions?: No ?Patient able to express need for assistance with ADLs?: Yes ?Does the patient have difficulty dressing or bathing?: No ?Independently performs ADLs?: Yes (appropriate for developmental age) ?Does the patient have difficulty walking or climbing stairs?: Yes ?Weakness of Legs: Both ?Weakness of Arms/Hands: None ? ?Permission Sought/Granted ?Permission sought to share information with : Case Manager, Customer service manager, Family Supports ?Permission granted to share information with : Yes, Verbal Permission Granted ?   ?   ?   ?   ? ?Emotional Assessment ?Appearance:: Appears stated age ?Attitude/Demeanor/Rapport: Engaged, Gracious ?Affect (typically observed): Accepting, Appropriate, Calm, Hopeful ?Orientation: : Oriented to Self, Oriented to Place, Oriented to  Time, Oriented to Situation ?Alcohol / Substance Use: Not Applicable ?Psych Involvement: No (comment) ? ?Admission diagnosis:  Shortness of breath [R06.02] ?Sepsis ([place]) [A41.9] ?Fever, unspecified fever cause [R50.9] ?Patient Active Problem List  ? Diagnosis Date Noted  ? Sepsis secondary to UTI ([place]) 09/06/2021  ? Complicated UTI (urinary tract infection) 09/06/2021  ? Severe sepsis ([place]) 09/01/2021  ? Cough 01/07/2019  ? Labile blood glucose   ? SOB (shortness of breath)   ? Urinary frequency   ? Hypoalbuminemia due to protein-calorie malnutrition ([place])   ? Supplemental oxygen dependent   ? Controlled type 2 diabetes mellitus with  hyperglycemia ([place])   ? Stage 3a chronic kidney disease (CKD) ([place])   ? Benign essential HTN   ? Physical debility 12/01/2018  ? Pressure injury of skin 11/27/2018  ? 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 Chronic kidney disease, stage 3b ([place])  11/03/2018  ? Type II diabetes mellitus with stage 3 chronic kidney disease ([place]) 11/03/2018  ? Essential hypertension 11/03/2018  ? Claudication in peripheral vascular disease ([place]) 06/10/2016  ? Morbid obesity ([place]) 09/21/2015  ? Upper airway cough syndrome 09/20/2015  ? Cough variant asthma 09/20/2015  ? ?PCP:  Velna Hatchet, MD ?Pharmacy:   ?[facility] [place], [address] ?[address] ?[place] [address] ?Phone: [phone] Fax: [phone] ? ? ? ? ?Social Determinants of Health (SDOH) Interventions ?  ? ?Readmission Risk Interventions ?   ? View : No data to display.  ?  ?  ?  ? ? ? ?

## 2021-09-07 NOTE — TOC Transition Note (Signed)
Transition of Care (TOC) - CM/SW Discharge Note ? ? ?Patient Details  ?Name: Andrew Larsen ?MRN: [id number] ?Date of Birth: Jul 31, 1940 ? ?Transition of Care (TOC) CM/SW Contact:  ?Paulene Floor Marchella Hibbard, LCSWA ?Phone Number: ?09/07/2021, 9:18 AM ? ? ?Clinical Narrative:    ?Patient will DC to:  SNF ?Anticipated DC date:  09/07/2021 ?Family notified:  [name],[name] (Daughter)  ? ?Transport [id number] ? ? ?Per MD patient ready for DC to SNF. RN to call report prior to discharge [phone] room 101. RN, patient, patient's family, and facility notified of DC. Discharge Summary and FL2 sent to facility. DC packet on chart. Ambulance transport will be requested for patient once d/c summary is updated.  ? ?CSW will sign off for now as social work intervention is no longer needed. Please consult [place] again if new needs arise. ?  ? ? ?Final next level of care: [place] ?Barriers to Discharge: Barriers Resolved ? ? ?Patient Goals and CMS Choice ?Patient states their goals for this hospitalization and ongoing recovery are:: To go to rehab and return home. ?CMS Medicare.gov Compare Post Acute Care list provided to:: Patient ?Choice offered to / list presented to : Patient, Adult Children [name]) ? ?Discharge Placement ?  ?           ?Patient [facility] bed at:  [facility]) ?Patient to be transferred to facility by: [facility] ?Name of family member notified: [name] (Daughter)   [phone] ?Patient and family notified of of transfer: 09/07/21 ? ?Discharge Plan and Services ?  ?Discharge Planning Services: CM Consult ?           ?  ?  ?  ?  ?  ?  ?[place] Agency:  (AIR) ?  ?  ?  ? ?Social Determinants of Health (SDOH) Interventions ?  ? ? ?Readmission Risk Interventions ?   ? View : No data to display.  ?  ?  ?  ? ? ? ? ? ?

## 2021-09-07 NOTE — Progress Notes (Signed)
The patient was supposed to be discharged yesterday, discharge summary was completed however we waited for his insurance company to approve/receive authorization all day long.  When the authorization was received it was already around 4:30 PM.  However TOC called the facility to arrange transportation however facility did not respond to the phone call and did not return the phone call.  For that reason, patient ended up staying in the hospital overnight.  Patient seen and examined this morning, there has been no change in patient's condition since yesterday, he is as a stable for discharge as he was yesterday so he will be discharged today. ?

## 2021-09-11 DIAGNOSIS — F39 Unspecified mood [affective] disorder: Secondary | ICD-10-CM | POA: Diagnosis not present

## 2021-09-11 DIAGNOSIS — N400 Enlarged prostate without lower urinary tract symptoms: Secondary | ICD-10-CM | POA: Diagnosis not present

## 2021-09-11 DIAGNOSIS — I10 Essential (primary) hypertension: Secondary | ICD-10-CM | POA: Diagnosis not present

## 2021-09-11 DIAGNOSIS — N390 Urinary tract infection, site not specified: Secondary | ICD-10-CM | POA: Diagnosis not present

## 2021-09-11 DIAGNOSIS — G7241 Inclusion body myositis [IBM]: Secondary | ICD-10-CM | POA: Diagnosis not present

## 2021-09-11 DIAGNOSIS — G4733 Obstructive sleep apnea (adult) (pediatric): Secondary | ICD-10-CM | POA: Diagnosis not present

## 2021-09-11 DIAGNOSIS — E1142 Type 2 diabetes mellitus with diabetic polyneuropathy: Secondary | ICD-10-CM | POA: Diagnosis not present

## 2021-09-11 DIAGNOSIS — K219 Gastro-esophageal reflux disease without esophagitis: Secondary | ICD-10-CM | POA: Diagnosis not present

## 2021-09-11 DIAGNOSIS — I5032 Chronic diastolic (congestive) heart failure: Secondary | ICD-10-CM | POA: Diagnosis not present

## 2021-10-04 DIAGNOSIS — C61 Malignant neoplasm of prostate: Secondary | ICD-10-CM | POA: Diagnosis not present

## 2021-10-10 DIAGNOSIS — I130 Hypertensive heart and chronic kidney disease with heart failure and stage 1 through stage 4 chronic kidney disease, or unspecified chronic kidney disease: Secondary | ICD-10-CM | POA: Diagnosis not present

## 2021-10-10 DIAGNOSIS — R2689 Other abnormalities of gait and mobility: Secondary | ICD-10-CM | POA: Diagnosis not present

## 2021-10-10 DIAGNOSIS — E1140 Type 2 diabetes mellitus with diabetic neuropathy, unspecified: Secondary | ICD-10-CM | POA: Diagnosis not present

## 2021-10-10 DIAGNOSIS — I5030 Unspecified diastolic (congestive) heart failure: Secondary | ICD-10-CM | POA: Diagnosis not present

## 2021-10-10 DIAGNOSIS — M6281 Muscle weakness (generalized): Secondary | ICD-10-CM | POA: Diagnosis not present

## 2021-10-10 DIAGNOSIS — R2681 Unsteadiness on feet: Secondary | ICD-10-CM | POA: Diagnosis not present

## 2021-10-10 DIAGNOSIS — N390 Urinary tract infection, site not specified: Secondary | ICD-10-CM | POA: Diagnosis not present

## 2021-10-10 DIAGNOSIS — A4151 Sepsis due to Escherichia coli [E. coli]: Secondary | ICD-10-CM | POA: Diagnosis not present

## 2021-10-11 DIAGNOSIS — I10 Essential (primary) hypertension: Secondary | ICD-10-CM | POA: Diagnosis not present

## 2021-10-11 DIAGNOSIS — N189 Chronic kidney disease, unspecified: Secondary | ICD-10-CM | POA: Diagnosis not present

## 2021-10-11 DIAGNOSIS — I5032 Chronic diastolic (congestive) heart failure: Secondary | ICD-10-CM | POA: Diagnosis not present

## 2021-10-11 DIAGNOSIS — R5381 Other malaise: Secondary | ICD-10-CM | POA: Diagnosis not present

## 2021-10-11 DIAGNOSIS — N1832 Chronic kidney disease, stage 3b: Secondary | ICD-10-CM | POA: Diagnosis not present

## 2021-10-11 DIAGNOSIS — E1165 Type 2 diabetes mellitus with hyperglycemia: Secondary | ICD-10-CM | POA: Diagnosis not present

## 2021-10-11 DIAGNOSIS — E1169 Type 2 diabetes mellitus with other specified complication: Secondary | ICD-10-CM | POA: Diagnosis not present

## 2021-10-11 DIAGNOSIS — C61 Malignant neoplasm of prostate: Secondary | ICD-10-CM | POA: Diagnosis not present

## 2021-10-11 DIAGNOSIS — J9601 Acute respiratory failure with hypoxia: Secondary | ICD-10-CM | POA: Diagnosis not present

## 2021-10-11 DIAGNOSIS — N390 Urinary tract infection, site not specified: Secondary | ICD-10-CM | POA: Diagnosis not present

## 2021-10-11 DIAGNOSIS — E7800 Pure hypercholesterolemia, unspecified: Secondary | ICD-10-CM | POA: Diagnosis not present

## 2021-10-11 DIAGNOSIS — I129 Hypertensive chronic kidney disease with stage 1 through stage 4 chronic kidney disease, or unspecified chronic kidney disease: Secondary | ICD-10-CM | POA: Diagnosis not present

## 2021-10-24 DIAGNOSIS — G4733 Obstructive sleep apnea (adult) (pediatric): Secondary | ICD-10-CM | POA: Diagnosis not present

## 2021-11-03 DIAGNOSIS — H401131 Primary open-angle glaucoma, bilateral, mild stage: Secondary | ICD-10-CM | POA: Diagnosis not present

## 2021-11-03 DIAGNOSIS — H524 Presbyopia: Secondary | ICD-10-CM | POA: Diagnosis not present

## 2021-11-03 DIAGNOSIS — H02051 Trichiasis without entropian right upper eyelid: Secondary | ICD-10-CM | POA: Diagnosis not present

## 2021-11-05 ENCOUNTER — Other Ambulatory Visit: Payer: Self-pay | Admitting: Cardiology

## 2021-11-05 DIAGNOSIS — K219 Gastro-esophageal reflux disease without esophagitis: Secondary | ICD-10-CM

## 2021-11-22 ENCOUNTER — Other Ambulatory Visit: Payer: Self-pay | Admitting: Student

## 2021-11-22 DIAGNOSIS — E7800 Pure hypercholesterolemia, unspecified: Secondary | ICD-10-CM

## 2021-12-05 DIAGNOSIS — R350 Frequency of micturition: Secondary | ICD-10-CM | POA: Diagnosis not present

## 2021-12-05 DIAGNOSIS — N401 Enlarged prostate with lower urinary tract symptoms: Secondary | ICD-10-CM | POA: Diagnosis not present

## 2021-12-05 DIAGNOSIS — R3915 Urgency of urination: Secondary | ICD-10-CM | POA: Diagnosis not present

## 2021-12-05 DIAGNOSIS — R351 Nocturia: Secondary | ICD-10-CM | POA: Diagnosis not present

## 2022-01-10 DIAGNOSIS — N453 Epididymo-orchitis: Secondary | ICD-10-CM | POA: Diagnosis not present

## 2022-01-22 DIAGNOSIS — G4733 Obstructive sleep apnea (adult) (pediatric): Secondary | ICD-10-CM | POA: Diagnosis not present

## 2022-01-31 DIAGNOSIS — M25571 Pain in right ankle and joints of right foot: Secondary | ICD-10-CM | POA: Diagnosis not present

## 2022-01-31 DIAGNOSIS — M25572 Pain in left ankle and joints of left foot: Secondary | ICD-10-CM | POA: Diagnosis not present

## 2022-02-04 ENCOUNTER — Other Ambulatory Visit: Payer: Self-pay | Admitting: Cardiology

## 2022-02-04 DIAGNOSIS — K219 Gastro-esophageal reflux disease without esophagitis: Secondary | ICD-10-CM

## 2022-02-06 DIAGNOSIS — H401131 Primary open-angle glaucoma, bilateral, mild stage: Secondary | ICD-10-CM | POA: Diagnosis not present

## 2022-02-15 ENCOUNTER — Ambulatory Visit: Payer: PPO | Admitting: Adult Health

## 2022-02-15 ENCOUNTER — Encounter: Payer: Self-pay | Admitting: Adult Health

## 2022-02-15 VITALS — BP 127/65 | HR 75 | Ht 71.0 in | Wt 266.4 lb

## 2022-02-15 DIAGNOSIS — G4733 Obstructive sleep apnea (adult) (pediatric): Secondary | ICD-10-CM | POA: Diagnosis not present

## 2022-02-15 NOTE — Patient Instructions (Signed)
Continue using CPAP nightly and greater than 4 hours each night °If your symptoms worsen or you develop new symptoms please let us know.  ° °

## 2022-02-15 NOTE — Progress Notes (Signed)
PATIENT: Andrew Larsen DOB: 1940-07-04  REASON FOR VISIT: follow up HISTORY FROM: patient  Chief Complaint  Patient presents with   Follow-up    Pt in 19  Pt here CPAP f/u Pt states hard for him to breath with mask on Pt states it feels like not  enough air coming out of mask       HISTORY OF PRESENT ILLNESS: Today 02/15/22:  Andrew Larsen is a 81 year old male with a history of obstructive sleep apnea on CPAP.  He returns today for follow-up.  His download indicates that he uses machine 28 out of 30 days for compliance of 93.3%.  He used it greater than 4 hours for compliance of 90%.  Average usage is 5 hours and 38 minutes.  His residual AHI is 5.1 on 11 cm of water with a ramp pressure of 4 cm of water.  He states that he does not feel that he is getting enough air.  Previous machine was set at 12 cm of water.  02/15/21: Andrew Larsen is a 81 year old male with a history of obstructive sleep apnea on CPAP.  He returns today for follow-up.  He reports that the CPAP is working well.  He denies any new issues.  He returns today for an evaluation.    08/15/20: Andrew Larsen is a 81 year old male with a history of obstructive sleep apnea on CPAP.  His download indicates that he uses machine 24 out of 30 days for compliance of 80%.  He uses machine greater than 4 hours for compliance of 60%.  On average he uses his machine 4 hours and 41 minutes.  His residual AHI is 6.9 on 11 cm of water.  Reports that some night he has to take the machine off because he feels as if he is not getting enough pressure.  He returns today for an evaluation.  HISTORY 02/15/20: He reports more weakness in his legs.  He fell a few weeks ago as he lost his balance but he also was not using his walker.  He has been using a 2 wheeled walker consistently.  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  Unfortunately, he contracted COVID-19 and was hospitalized from November 03 2018 through August third  2020 and subsequent inpatient rehab from August third 2020 through August 24th 2020.  He is followed by cardiology and pulmonology.  He has not seen his [place] neurologist in several months.  He would like to get some outpatient physical therapy.  He has not used his CPAP machine after he found out that his machine was on recall.  He has registered his machine on the website but has not talked to his DME company about a replacement yet.  He has used an ozone based cleaning machine on his CPAP machine and is advised to no longer use it for now until we have a better sense for when he gets a replacement machine.  He does admit that he does not sleep as well without his machine.     REVIEW OF SYSTEMS: Out of a complete 14 system review of symptoms, the patient complains only of the following symptoms, and all other reviewed systems are negative.   ESS 13  ALLERGIES: Allergies  Allergen Reactions   Metformin And Related Other (See Comments)    Reaction?   Simbrinza [Brinzolamide-Brimonidine] Other (See Comments)    Drainage, redness to eyes    HOME MEDICATIONS: Outpatient Medications Prior to Visit  Medication Sig Dispense Refill   albuterol (VENTOLIN HFA) 108 (90 Base) MCG/ACT inhaler Inhale 2 puffs into the lungs every 4 (four) hours as needed for wheezing or shortness of breath. 18 g 5   aspirin 81 MG chewable tablet Chew 81 mg by mouth at bedtime.      calcium carbonate (CALCIUM 600) 600 MG TABS tablet Take 2 tablets (1,200 mg total) by mouth daily. 60 tablet 0   Cholecalciferol (VITAMIN D3) 5000 units TABS Take 10,000 Units by mouth daily.      Continuous Blood Gluc Sensor (FREESTYLE LIBRE 2 SENSOR) MISC Inject 1 Device into the skin every 14 (fourteen) days.     dorzolamide (TRUSOPT) 2 % ophthalmic solution Place 1 drop into the right eye 2 (two) times daily.     ezetimibe (ZETIA) 10 MG tablet TAKE 1 TABLET(10 MG) BY MOUTH DAILY AFTER SUPPER 90 tablet 0   famotidine (PEPCID) 20 MG tablet  Take 1 tablet (20 mg total) by mouth at bedtime. 30 tablet 0   folic acid (FOLVITE) 1 MG tablet Take 1 tablet (1 mg total) by mouth daily. 30 tablet 0   furosemide (LASIX) 40 MG tablet Take 1 tablet (40 mg total) by mouth daily. (Patient taking differently: Take 40 mg by mouth 2 (two) times daily.) 30 tablet 0   gabapentin (NEURONTIN) 300 MG capsule Take 2 capsules (600 mg total) by mouth 3 (three) times daily. (Patient taking differently: Take 900 mg by mouth 3 (three) times daily.) 90 capsule 0   insulin aspart protamine - aspart (NOVOLOG MIX 70/30 FLEXPEN) (70-30) 100 UNIT/ML FlexPen Inject 0.36 mLs (36 Units total) into the skin daily with breakfast. (Patient taking differently: Inject 60 Units into the skin in the morning and at bedtime.) 15 mL 11   losartan (COZAAR) 25 MG tablet TAKE 1 TABLET(25 MG) BY MOUTH DAILY (Patient taking differently: Take 25 mg by mouth daily.) 30 tablet 2   Magnesium 500 MG TABS Take 500 mg by mouth daily.     metoprolol tartrate (LOPRESSOR) 25 MG tablet Take 1 tablet (25 mg total) by mouth 2 (two) times daily. 60 tablet 0   pantoprazole (PROTONIX) 40 MG tablet TAKE 1 TABLET(40 MG) BY MOUTH DAILY BEFORE BREAKFAST AS NEEDED FOR COUGH OR ACID REFLUX 90 tablet 0   potassium chloride SA (K-DUR) 20 MEQ tablet Take 1 tablet (20 mEq total) by mouth daily. (Patient not taking: Reported on 09/01/2021) 30 tablet 0   PRESCRIPTION MEDICATION CPAP- At bedtime     tamsulosin (FLOMAX) 0.4 MG CAPS capsule Take 0.4 mg by mouth daily.     timolol (TIMOPTIC) 0.5 % ophthalmic solution Place 1 drop into the right eye 2 (two) times daily.     zinc sulfate 220 (50 Zn) MG capsule Take 1 capsule (220 mg total) by mouth daily. 30 capsule 0   No facility-administered medications prior to visit.    PAST MEDICAL HISTORY: Past Medical History:  Diagnosis Date   CKD (chronic kidney disease)    CKD stage III (01/2018)   Daytime somnolence    Diabetes mellitus without complication (HCC)     Fracture    right fibula/wears boot cast   GERD (gastroesophageal reflux disease)    Glaucoma    Hypercholesteremia    Hyperlipidemia    Hypertension    Mold exposure    PAD (peripheral artery disease) ([place])    Sleep apnea     PAST SURGICAL HISTORY: Past Surgical History:  Procedure Laterality Date  LOWER EXTREMITY ANGIOGRAPHY N/A 06/12/2016   Procedure: Lower Extremity Angiography;  Surgeon: Adrian Prows, MD;  Location: [facility];  Service: Cardiovascular;  Laterality: N/A;   MUSCLE BIOPSY Right 05/06/2018   Procedure: right vastus lateralis muscle biopsy;  Surgeon: Earnie Larsson, MD;  Location: [place];  Service: Neurosurgery;  Laterality: Right;   ORIF ANKLE FRACTURE Right 10/02/2012   Procedure: OPEN REDUCTION INTERNAL FIXATION (ORIF) ANKLE FRACTURE;  Surgeon: Meredith Pel, MD;  Location: [facility];  Service: Orthopedics;  Laterality: Right;   PERIPHERAL VASCULAR ATHERECTOMY Left 06/12/2016   Procedure: Peripheral Vascular Atherectomy-Left Popliteal;  Surgeon: Adrian Prows, MD;  Location: [place] [facility];  Service: Cardiovascular;  Laterality: Left;   PERIPHERAL VASCULAR INTERVENTION Left 06/12/2016   Procedure: Peripheral Vascular Intervention- DCB Left Popliteal;  Surgeon: Adrian Prows, MD;  Location: [facility];  Service: Cardiovascular;  Laterality: Left;    FAMILY HISTORY: Family History  Problem Relation Age of Onset   Heart disease Father    Prostate cancer Brother    Cancer Brother    Diabetes Brother    Colon cancer Neg Hx     SOCIAL HISTORY: Social History   Socioeconomic History   Marital status: Widowed    Spouse name: Not on file   Number of children: 1   Years of education: Not on file   Highest education level: Not on file  Occupational History   Not on file  Tobacco Use   Smoking status: Former    Packs/day: 1.00    Years: 20.00    Total pack years: 20.00    Types: Cigarettes    Quit date: 08/22/1981    Years since quitting: 40.5    Smokeless tobacco: Never  Vaping Use   Vaping Use: Never used  Substance and Sexual Activity   Alcohol use: No    Alcohol/week: 0.0 standard drinks of alcohol   Drug use: No   Sexual activity: Never  Other Topics Concern   Not on file  Social History Narrative   ** Merged History Encounter **       Social Determinants of Health   Financial Resource Strain: Not on file  Food Insecurity: Not on file  Transportation Needs: Not on file  Physical Activity: Not on file  Stress: Not on file  Social Connections: Not on file  Intimate Partner Violence: Not on file      PHYSICAL EXAM  Vitals:   02/15/22 1112  BP: 127/65  Pulse: 75  Weight: 266 lb 6.4 oz (120.8 kg)  Height: '5\' 11"'$  (1.803 m)    Body mass index is 37.16 kg/m.  Generalized: Well developed, in no acute distress  Chest: Lungs clear to auscultation bilaterally  Neurological examination  Mentation: Alert oriented to time, place, history taking. Follows all commands speech and language fluent Cranial nerve II-XII: Extraocular movements were full, visual field were full on confrontational test Head turning and shoulder shrug  were normal and symmetric. Motor: The motor testing reveals 5 over 5 strength of all 4 extremities. Good symmetric motor tone is noted throughout.  Sensory: Sensory testing is intact to soft touch on all 4 extremities. No evidence of extinction is noted.  Gait and station: Gait is normal.    DIAGNOSTIC DATA (LABS, IMAGING, TESTING) - I reviewed patient records, labs, notes, testing and imaging myself where available.  Lab Results  Component Value Date   WBC 8.2 09/06/2021   HGB 14.5 09/06/2021   HCT 44.3 09/06/2021  MCV 92.5 09/06/2021   PLT 192 09/06/2021      Component Value Date/Time   NA 137 09/06/2021 0839   NA 142 02/15/2020 0921   K 4.2 09/06/2021 0839   CL 101 09/06/2021 0839   CO2 28 09/06/2021 0839   GLUCOSE 225 (H) 09/06/2021 0839   BUN 14 09/06/2021 0839   BUN 21  02/15/2020 0921   CREATININE 1.15 09/06/2021 0839   CALCIUM 9.0 09/06/2021 0839   PROT 7.4 09/01/2021 1511   PROT 6.1 07/13/2019 0833   ALBUMIN 3.5 09/01/2021 1511   ALBUMIN 3.9 07/13/2019 0833   AST 36 09/01/2021 1511   ALT 28 09/01/2021 1511   ALKPHOS 109 09/01/2021 1511   BILITOT 1.2 09/01/2021 1511   BILITOT 0.3 07/13/2019 0833   GFRNONAA >60 09/06/2021 0839   GFRAA 49 (L) 02/15/2020 0921   Lab Results  Component Value Date   CHOL 157 02/15/2020   HDL 49 02/15/2020   LDLCALC 95 02/15/2020   TRIG 67 02/15/2020   CHOLHDL 2.7 11/08/2018   Lab Results  Component Value Date   HGBA1C 8.4 (H) 09/02/2021   No results found for: "VITAMINB12" Lab Results  Component Value Date   TSH 3.910 07/13/2019      ASSESSMENT AND PLAN 81 y.o. year old male  has a past medical history of CKD (chronic kidney disease), Daytime somnolence, Diabetes mellitus without complication (HCC), Fracture, GERD (gastroesophageal reflux disease), Glaucoma, Hypercholesteremia, Hyperlipidemia, Hypertension, Mold exposure, PAD (peripheral artery disease) ([place]), and Sleep apnea. here with:  OSA on CPAP  - CPAP compliance excellent - Residual AHI in normal range -We will increase pressure to 12 cm of - Encourage patient to use CPAP nightly and > 4 hours each night - F/U in  1 year or sooner if needed    Ward Givens, MSN, NP-C 02/15/2022, 11:07 AM [facility] [address], [place], [place] [address] [phone]

## 2022-02-21 DIAGNOSIS — M25571 Pain in right ankle and joints of right foot: Secondary | ICD-10-CM | POA: Diagnosis not present

## 2022-02-23 ENCOUNTER — Other Ambulatory Visit: Payer: Self-pay | Admitting: Cardiology

## 2022-02-23 DIAGNOSIS — E7800 Pure hypercholesterolemia, unspecified: Secondary | ICD-10-CM

## 2022-03-06 DIAGNOSIS — Z125 Encounter for screening for malignant neoplasm of prostate: Secondary | ICD-10-CM | POA: Diagnosis not present

## 2022-03-06 DIAGNOSIS — R7989 Other specified abnormal findings of blood chemistry: Secondary | ICD-10-CM | POA: Diagnosis not present

## 2022-03-06 DIAGNOSIS — N1832 Chronic kidney disease, stage 3b: Secondary | ICD-10-CM | POA: Diagnosis not present

## 2022-03-06 DIAGNOSIS — E785 Hyperlipidemia, unspecified: Secondary | ICD-10-CM | POA: Diagnosis not present

## 2022-03-06 DIAGNOSIS — I10 Essential (primary) hypertension: Secondary | ICD-10-CM | POA: Diagnosis not present

## 2022-03-06 DIAGNOSIS — E1169 Type 2 diabetes mellitus with other specified complication: Secondary | ICD-10-CM | POA: Diagnosis not present

## 2022-03-13 DIAGNOSIS — D692 Other nonthrombocytopenic purpura: Secondary | ICD-10-CM | POA: Diagnosis not present

## 2022-03-13 DIAGNOSIS — E785 Hyperlipidemia, unspecified: Secondary | ICD-10-CM | POA: Diagnosis not present

## 2022-03-13 DIAGNOSIS — Z794 Long term (current) use of insulin: Secondary | ICD-10-CM | POA: Diagnosis not present

## 2022-03-13 DIAGNOSIS — C61 Malignant neoplasm of prostate: Secondary | ICD-10-CM | POA: Diagnosis not present

## 2022-03-13 DIAGNOSIS — Z1339 Encounter for screening examination for other mental health and behavioral disorders: Secondary | ICD-10-CM | POA: Diagnosis not present

## 2022-03-13 DIAGNOSIS — I700 Atherosclerosis of aorta: Secondary | ICD-10-CM | POA: Diagnosis not present

## 2022-03-13 DIAGNOSIS — E1169 Type 2 diabetes mellitus with other specified complication: Secondary | ICD-10-CM | POA: Diagnosis not present

## 2022-03-13 DIAGNOSIS — I130 Hypertensive heart and chronic kidney disease with heart failure and stage 1 through stage 4 chronic kidney disease, or unspecified chronic kidney disease: Secondary | ICD-10-CM | POA: Diagnosis not present

## 2022-03-13 DIAGNOSIS — I5032 Chronic diastolic (congestive) heart failure: Secondary | ICD-10-CM | POA: Diagnosis not present

## 2022-03-13 DIAGNOSIS — N3281 Overactive bladder: Secondary | ICD-10-CM | POA: Diagnosis not present

## 2022-03-13 DIAGNOSIS — Z1331 Encounter for screening for depression: Secondary | ICD-10-CM | POA: Diagnosis not present

## 2022-03-13 DIAGNOSIS — Z0000 Encounter for general adult medical examination without abnormal findings: Secondary | ICD-10-CM | POA: Diagnosis not present

## 2022-03-13 DIAGNOSIS — N1832 Chronic kidney disease, stage 3b: Secondary | ICD-10-CM | POA: Diagnosis not present

## 2022-03-13 DIAGNOSIS — E1142 Type 2 diabetes mellitus with diabetic polyneuropathy: Secondary | ICD-10-CM | POA: Diagnosis not present

## 2022-03-13 DIAGNOSIS — R82998 Other abnormal findings in urine: Secondary | ICD-10-CM | POA: Diagnosis not present

## 2022-03-19 DIAGNOSIS — G4733 Obstructive sleep apnea (adult) (pediatric): Secondary | ICD-10-CM | POA: Diagnosis not present

## 2022-03-28 DIAGNOSIS — C61 Malignant neoplasm of prostate: Secondary | ICD-10-CM | POA: Diagnosis not present

## 2022-04-04 DIAGNOSIS — C61 Malignant neoplasm of prostate: Secondary | ICD-10-CM | POA: Diagnosis not present

## 2022-04-04 DIAGNOSIS — N401 Enlarged prostate with lower urinary tract symptoms: Secondary | ICD-10-CM | POA: Diagnosis not present

## 2022-04-04 DIAGNOSIS — R3915 Urgency of urination: Secondary | ICD-10-CM | POA: Diagnosis not present

## 2022-04-04 DIAGNOSIS — R351 Nocturia: Secondary | ICD-10-CM | POA: Diagnosis not present

## 2022-05-04 ENCOUNTER — Other Ambulatory Visit: Payer: Self-pay | Admitting: Cardiology

## 2022-05-04 DIAGNOSIS — K219 Gastro-esophageal reflux disease without esophagitis: Secondary | ICD-10-CM

## 2022-06-07 DIAGNOSIS — I10 Essential (primary) hypertension: Secondary | ICD-10-CM | POA: Diagnosis not present

## 2022-06-07 DIAGNOSIS — E1165 Type 2 diabetes mellitus with hyperglycemia: Secondary | ICD-10-CM | POA: Diagnosis not present

## 2022-06-07 DIAGNOSIS — E7800 Pure hypercholesterolemia, unspecified: Secondary | ICD-10-CM | POA: Diagnosis not present

## 2022-06-11 DIAGNOSIS — H2511 Age-related nuclear cataract, right eye: Secondary | ICD-10-CM | POA: Diagnosis not present

## 2022-06-11 DIAGNOSIS — H401131 Primary open-angle glaucoma, bilateral, mild stage: Secondary | ICD-10-CM | POA: Diagnosis not present

## 2022-06-24 DIAGNOSIS — M25512 Pain in left shoulder: Secondary | ICD-10-CM | POA: Diagnosis not present

## 2022-06-24 DIAGNOSIS — Y929 Unspecified place or not applicable: Secondary | ICD-10-CM | POA: Diagnosis not present

## 2022-06-24 DIAGNOSIS — Y939 Activity, unspecified: Secondary | ICD-10-CM | POA: Diagnosis not present

## 2022-08-22 ENCOUNTER — Other Ambulatory Visit: Payer: Self-pay | Admitting: Urology

## 2022-08-22 DIAGNOSIS — C61 Malignant neoplasm of prostate: Secondary | ICD-10-CM

## 2022-08-24 ENCOUNTER — Ambulatory Visit: Payer: PPO | Admitting: Cardiology

## 2022-08-24 ENCOUNTER — Encounter: Payer: Self-pay | Admitting: Cardiology

## 2022-08-24 VITALS — BP 130/78 | HR 60 | Resp 16 | Ht 71.0 in | Wt 271.0 lb

## 2022-08-24 DIAGNOSIS — I10 Essential (primary) hypertension: Secondary | ICD-10-CM

## 2022-08-24 DIAGNOSIS — E7800 Pure hypercholesterolemia, unspecified: Secondary | ICD-10-CM

## 2022-08-24 DIAGNOSIS — I739 Peripheral vascular disease, unspecified: Secondary | ICD-10-CM

## 2022-08-24 DIAGNOSIS — G7241 Inclusion body myositis [IBM]: Secondary | ICD-10-CM

## 2022-08-24 NOTE — Progress Notes (Signed)
Primary Physician/Referring:  Alysia Penna, MD  Patient ID: Andrew Larsen, male    DOB: 05/26/1940, 82 y.o.   MRN: [id number]  Chief Complaint  Patient presents with   PAD   Hyperlipidemia   Follow-up   HPI:    Andrew Larsen  is a 82 y.o. African-American male with hypertension, hyperlipidemia, uncontrolled diabetes mellitus with peripheral arterial disease and history of angioplasty to left SFA in 2018, inclusion body myositis at [facility] and has a positive NT5C1A antibody, morbid obesity.   He is not on a statin due to inclusion body myositis.  He is presently tolerating Zetia without any side effects.  He has not had any further leg cramps or symptoms suggestive of claudication.  He has not noticed any ulceration.  He is markedly sedentary.    Denies chest pain, dyspnea.  He had a mechanical fall 3 weeks ago and injured his shin, fortunately wound is healing well.    Past Medical History:  Diagnosis Date   CKD (chronic kidney disease)    CKD stage III (01/2018)   Daytime somnolence    Diabetes mellitus without complication (HCC)    Fracture    right fibula/wears boot cast   GERD (gastroesophageal reflux disease)    Glaucoma    Hypercholesteremia    Hyperlipidemia    Hypertension    Mold exposure    PAD (peripheral artery disease) (HCC)    Sleep apnea    Past Surgical History:  Procedure Laterality Date   LOWER EXTREMITY ANGIOGRAPHY N/A 06/12/2016   Procedure: Lower Extremity Angiography;  Surgeon: Yates Decamp, MD;  Location: [name];  Service: Cardiovascular;  Laterality: N/A;   MUSCLE BIOPSY Right 05/06/2018   Procedure: right vastus lateralis muscle biopsy;  Surgeon: Julio Sicks, MD;  Location: [facility];  Service: Neurosurgery;  Laterality: Right;   ORIF ANKLE FRACTURE Right 10/02/2012   Procedure: OPEN REDUCTION INTERNAL FIXATION (ORIF) ANKLE FRACTURE;  Surgeon: Cammy Copa, MD;  Location[facility];  Service: Orthopedics;  Laterality: Right;    PERIPHERAL VASCULAR ATHERECTOMY Left 06/12/2016   Procedure: Peripheral Vascular Atherectomy-Left Popliteal;  Surgeon: Yates Decamp, MD;  Location: [facility];  Service: Cardiovascular;  Laterality: Left;   PERIPHERAL VASCULAR INTERVENTION Left 06/12/2016   Procedure: Peripheral Vascular Intervention- DCB Left Popliteal;  Surgeon: Yates Decamp, MD;  Location: [facility];  Service: Cardiovascular;  Laterality: Left;   Social History   Tobacco Use   Smoking status: Former    Packs/day: 1.00    Years: 20.00    Additional pack years: 0.00    Total pack years: 20.00    Types: Cigarettes    Quit date: 08/22/1981    Years since quitting: 41.0   Smokeless tobacco: Never  Substance Use Topics   Alcohol use: No    Alcohol/week: 0.0 standard drinks of alcohol    ROS  Review of Systems  Cardiovascular:  Negative for claudication and leg swelling.  Gastrointestinal:  Negative for melena.  Neurological:  Positive for paresthesias (feet) and weakness.   Objective  Blood pressure (!) 151/71, pulse 60, resp. rate 16, height 5\' 11"  (1.803 m), weight 271 lb (122.9 kg), SpO2 100 %.     08/24/2022    2:49 PM 02/15/2022   11:12 AM 09/07/2021    8:49 AM  Vitals with BMI  Height 5\' 11"  5\' 11"    Weight 271 lbs 266 lbs 6 oz   BMI 37.81 37.17   Systolic  151 127 174  Diastolic 71 65 83  Pulse 60 75 80     Physical Exam Constitutional:      Appearance: He is morbidly obese.     Comments: Morbidly obese in no acute distress.  Neck:     Vascular: No carotid bruit or JVD.  Cardiovascular:     Rate and Rhythm: Normal rate and regular rhythm.     Pulses:          Popliteal pulses are 0 on the right side and 0 on the left side.       Dorsalis pedis pulses are 0 on the right side and 0 on the left side.       Posterior tibial pulses are 0 on the right side and 0 on the left side.     Heart sounds: Normal heart sounds. No murmur heard.    No gallop.  Pulmonary:     Effort: Pulmonary effort  is normal.     Breath sounds: Normal breath sounds.  Abdominal:     General: Bowel sounds are normal.     Palpations: Abdomen is soft.     Comments: Obese. Pannus present  Musculoskeletal:     Right lower leg: Edema (2+ below-knee pitting) present.     Left lower leg: Edema (2+ pitting below-knee) present.  Skin:    Capillary Refill: Capillary refill takes less than 2 seconds.    Laboratory examination:   External labs 02/03/2019:   Labs 03/15/2022:  Urine albumin to creatinine ratio 2.0.  Hb 14.7/HCT 42.4, platelets 191, normal indicis.  Serum glucose 348, BUN 25, creatinine 1.3, EGFR 64 mL, potassium 4.4.  A1c 6.6%.  TSH normal at 2.09.  Total cholesterol 131, triglycerides 66, HDL 41, LDL 77.  Non-HDL cholesterol 90.  Radiology:  No results found. Cardiac Studies:   Peripheral arteriogram 06/12/2016: Left SFA calcific 100% to 0% with CSI atherectomy and Drug coated stenting with 6.0x120 mm Zilver PTX. Left AT occluded prox and reconstitutes at ankle.  Lower Extremity Arterial Duplex 06/24/2019:  No hemodynamically significant stenoses are identified in the lower  extremity arterial system. There is diffuse small vessel disease below  knee bilaterally. Left SFA and popliteal artery angioplasty site is patent.   This exam reveals moderately decreased perfusion of the right lower  extremity, noted at the anterior tibial and post tibial artery level (ABI  0.77) with severely abnormal waveform. Mildly decreased perfusion of the left lower extremity, noted at the anterior tibial and post tibial  artery level (ABI 0.85) with moderately abnormal waveform.  No significant change from 04/11/2017.  LexiscanTetrofosmin Stress Test  06/15/2019: Nondiagnostic ECG stress. Myocardial perfusion is normal. Stress LV EF: 59%.  No change from 05/14/2013. Low risk study.   Echocardiogram 06/24/2019:  Study Quality: Technically Difficult  Normal LV systolic function with visual EF  60-65%. Left ventricle cavity  is normal in size. Moderate left ventricular hypertrophy. Normal global  wall motion. Indeterminate diastolic filling pattern, elevated LAP. No obvious regional wall motion abnormalities. Calculated EF 69%.  Mild  mitral regurgitation. Mild calcification of the mitral valve  annulus. No other significant valvular abnormalities.  Prior study dated 05/03/2016 Mild concentric hypertrophy of the left ventricle.  Normal global wall motion. Grade II diastolic dysfunction, elevated LAP.   EKG:  EKG 08/24/2022: Sinus rhythm with first-degree AV block at rate of 62 bpm, normal axis, incomplete right bundle branch block.  No evidence of ischemia.  Compared to 08/25/2021, first-degree AV block  Medications and allergies   Allergies  Allergen Reactions   Metformin And Related Other (See Comments)    Reaction?   Simbrinza [Brinzolamide-Brimonidine] Other (See Comments)    Drainage, redness to eyes    Current Outpatient Medications:    aspirin 81 MG chewable tablet, Chew 81 mg by mouth at bedtime. , Disp: , Rfl:    Cholecalciferol (VITAMIN D3) 5000 units TABS, Take 10,000 Units by mouth daily. , Disp: , Rfl:    Continuous Blood Gluc Sensor (FREESTYLE LIBRE 2 SENSOR) MISC, Inject 1 Device into the skin every 14 (fourteen) days., Disp: , Rfl:    cyclobenzaprine (FLEXERIL) 10 MG tablet, Take 10 mg by mouth at bedtime., Disp: , Rfl:    dorzolamide (TRUSOPT) 2 % ophthalmic solution, Place 1 drop into the right eye 2 (two) times daily., Disp: , Rfl:    ezetimibe (ZETIA) 10 MG tablet, TAKE 1 TABLET(10 MG) BY MOUTH DAILY AFTER SUPPER, Disp: 90 tablet, Rfl: 3   famotidine (PEPCID) 20 MG tablet, Take 1 tablet (20 mg total) by mouth at bedtime., Disp: 30 tablet, Rfl: 0   folic acid (FOLVITE) 1 MG tablet, Take 1 tablet (1 mg total) by mouth daily., Disp: 30 tablet, Rfl: 0   furosemide (LASIX) 40 MG tablet, Take 1 tablet (40 mg total) by mouth daily. (Patient taking differently: Take 40  mg by mouth 2 (two) times daily.), Disp: 30 tablet, Rfl: 0   gabapentin (NEURONTIN) 300 MG capsule, Take 2 capsules (600 mg total) by mouth 3 (three) times daily. (Patient taking differently: Take 900 mg by mouth 3 (three) times daily.), Disp: 90 capsule, Rfl: 0   insulin aspart protamine - aspart (NOVOLOG MIX 70/30 FLEXPEN) (70-30) 100 UNIT/ML FlexPen, Inject 0.36 mLs (36 Units total) into the skin daily with breakfast. (Patient taking differently: Inject 40 Units into the skin in the morning and at bedtime.), Disp: 15 mL, Rfl: 11   losartan (COZAAR) 25 MG tablet, TAKE 1 TABLET(25 MG) BY MOUTH DAILY (Patient taking differently: Take 25 mg by mouth daily.), Disp: 30 tablet, Rfl: 2   metoprolol tartrate (LOPRESSOR) 25 MG tablet, Take 1 tablet (25 mg total) by mouth 2 (two) times daily., Disp: 60 tablet, Rfl: 0   pantoprazole (PROTONIX) 40 MG tablet, TAKE 1 TABLET(40 MG) BY MOUTH DAILY BEFORE BREAKFAST AS NEEDED FOR COUGH OR ACID REFLUX, Disp: 90 tablet, Rfl: 3   PRESCRIPTION MEDICATION, CPAP- At bedtime, Disp: , Rfl:    solifenacin (VESICARE) 5 MG tablet, Take 5 mg by mouth daily., Disp: , Rfl:    tamsulosin (FLOMAX) 0.4 MG CAPS capsule, Take 0.4 mg by mouth daily., Disp: , Rfl:    timolol (TIMOPTIC) 0.5 % ophthalmic solution, Place 1 drop into the right eye 2 (two) times daily., Disp: , Rfl:    triamterene-hydrochlorothiazide (MAXZIDE-25) 37.5-25 MG tablet, Take 1 tablet by mouth daily., Disp: , Rfl:    JARDIANCE 10 MG TABS tablet, Take 10 mg by mouth daily., Disp: , Rfl:    latanoprost (XALATAN) 0.005 % ophthalmic solution, Place 1 drop into both eyes at bedtime., Disp: , Rfl:    Assessment     ICD-10-CM   1. Essential hypertension  I10 EKG 12-Lead    2. Claudication in peripheral vascular disease (HCC)  I73.9     3. Hypercholesteremia  E78.00     4. Inclusion body myositis  G72.41       No orders of the defined types were placed in this encounter.   Medications Discontinued  During This  Encounter  Medication Reason   albuterol (VENTOLIN HFA) 108 (90 Base) MCG/ACT inhaler    zinc sulfate 220 (50 Zn) MG capsule    potassium chloride SA (K-DUR) 20 MEQ tablet    Magnesium 500 MG TABS    calcium carbonate (CALCIUM 600) 600 MG TABS tablet      Recommendations:   LUC SHAMMAS  is a 82 y.o. African-American male with hypertension, hyperlipidemia, uncontrolled diabetes mellitus with peripheral arterial disease and history of angioplasty to left SFA in 2018, inclusion body myositis at [facility] and has a positive NT5C1A antibody, morbid obesity.   1. Claudication in peripheral vascular disease (HCC) Patient symptoms of claudication have completely resolved however patient has very limited physical activity.  Fortunately no skin breakdown, although he has recently had a accidental fall and had shin injury, it is completely healed.  I have discussed with the patient and his son, regarding any breakdown in his feet he should immediately contact us so we can keep an eye on the PAD and potential for nonhealing ulceration.  Overall stable from vascular standpoint, capillary refill <2 seconds, unable to feel his pedal pulses however in view of stable vascular standpoint, no symptoms of claudication, would recommend continued secondary prevention.  I will see him back on a as needed basis.  They are aware to contact me if he were to develop close discoloration, skin ulceration, rest pain or worsening symptoms of claudication.  2. Essential hypertension Blood pressure is well-controlled, he is on ARB and metoprolol, also on Maxide.  Continue the same. - EKG 12-Lead  3. Hypercholesteremia His lipids are relatively well-controlled, LDL is around 70, he is unable to tolerate statins due to inclusion body myositis.  Weight loss discussed with the patient.  He may be a good candidate for GLP-1 agonist which may help with weight loss and also cardiovascular protection.  4. Inclusion body  myositis As dictated above, patient has inclusion myositis fortunately he is now close to 82 years of age next month, has done well with no significant limitations.  Overall stable as dictated above, we will see him back on a as needed basis.  External labs reviewed.    Yates Decamp, MD, [facility] 08/24/2022, 3:11 PM Office: [phone]

## 2022-08-27 DIAGNOSIS — G4733 Obstructive sleep apnea (adult) (pediatric): Secondary | ICD-10-CM | POA: Diagnosis not present

## 2022-10-01 ENCOUNTER — Ambulatory Visit
Admission: RE | Admit: 2022-10-01 | Discharge: 2022-10-01 | Disposition: A | Discharge status: Home / Self Care | Payer: PPO | Source: Ambulatory Visit | Attending: Urology | Admitting: Urology

## 2022-10-01 DIAGNOSIS — C61 Malignant neoplasm of prostate: Secondary | ICD-10-CM

## 2022-10-01 DIAGNOSIS — R972 Elevated prostate specific antigen [PSA]: Secondary | ICD-10-CM | POA: Diagnosis not present

## 2022-10-01 MED ORDER — GADOPICLENOL 0.5 MMOL/ML IV SOLN
10.0000 mL | Freq: Once | INTRAVENOUS | Status: AC | PRN
  Administered 2022-10-01: 10 mL via INTRAVENOUS

## 2022-10-05 DIAGNOSIS — C61 Malignant neoplasm of prostate: Secondary | ICD-10-CM | POA: Diagnosis not present

## 2022-10-25 DIAGNOSIS — C61 Malignant neoplasm of prostate: Secondary | ICD-10-CM | POA: Diagnosis not present

## 2022-10-29 ENCOUNTER — Telehealth: Payer: Self-pay | Admitting: Radiation Oncology

## 2022-10-29 NOTE — Telephone Encounter (Signed)
Called patient to schedule a consultation w. Dr. Manning. No answer, LVM for a return call.  ?

## 2022-10-29 NOTE — Progress Notes (Signed)
GU Location of Tumor / Histology: Prostate Ca  If Prostate Cancer, Gleason Score is (4 + 3) and PSA is (5.59 on 10/05/2022)  Biopsies     10/01/2022 Dr. Marcine Matar MR Prostate with/without Contrast CLINICAL DATA:  Prostate cancer, Gleason 3+4=7 prostate adenocarcinoma of the left lateral mid gland on biopsy dated 08/30/2021. C61. Elevated PSA of 4.53 on 03/28/2022. R97.20  IMPRESSION: 1. PI-RADS category 4 lesion of the left posteromedial peripheral zone at the base and mid gland. Targeting data sent to [facility]. 2. Mild prostatomegaly and benign prostatic hypertrophy.   Past/Anticipated interventions by urology, if any: NA  Past/Anticipated interventions by medical oncology, if any: NA  Weight changes, if any: {:18581}  IPSS: SHIM:  Bowel/Bladder complaints, if any: {:18581}   Nausea/Vomiting, if any: {:18581}  Pain issues, if any:  {:18581}  SAFETY ISSUES: Prior radiation? {:18581} Pacemaker/ICD? {:18581} Possible current pregnancy? Male Is the patient on methotrexate? No  Current Complaints / other details:

## 2022-10-31 ENCOUNTER — Ambulatory Visit
Admission: RE | Admit: 2022-10-31 | Discharge: 2022-10-31 | Disposition: A | Discharge status: Home / Self Care | Payer: PPO | Source: Ambulatory Visit | Attending: Radiation Oncology | Admitting: Radiation Oncology

## 2022-10-31 ENCOUNTER — Other Ambulatory Visit: Payer: Self-pay

## 2022-10-31 ENCOUNTER — Encounter: Payer: Self-pay | Admitting: Radiation Oncology

## 2022-10-31 VITALS — BP 127/58 | HR 63 | Temp 97.3°F | Resp 18 | Ht 71.0 in | Wt 268.2 lb

## 2022-10-31 DIAGNOSIS — C61 Malignant neoplasm of prostate: Secondary | ICD-10-CM

## 2022-10-31 DIAGNOSIS — E119 Type 2 diabetes mellitus without complications: Secondary | ICD-10-CM | POA: Diagnosis not present

## 2022-10-31 DIAGNOSIS — Z79899 Other long term (current) drug therapy: Secondary | ICD-10-CM | POA: Diagnosis not present

## 2022-10-31 DIAGNOSIS — N183 Chronic kidney disease, stage 3 unspecified: Secondary | ICD-10-CM | POA: Insufficient documentation

## 2022-10-31 DIAGNOSIS — Z87891 Personal history of nicotine dependence: Secondary | ICD-10-CM | POA: Insufficient documentation

## 2022-10-31 DIAGNOSIS — Z191 Hormone sensitive malignancy status: Secondary | ICD-10-CM | POA: Diagnosis not present

## 2022-10-31 DIAGNOSIS — E7800 Pure hypercholesterolemia, unspecified: Secondary | ICD-10-CM | POA: Insufficient documentation

## 2022-10-31 DIAGNOSIS — Z8042 Family history of malignant neoplasm of prostate: Secondary | ICD-10-CM | POA: Diagnosis not present

## 2022-10-31 DIAGNOSIS — K219 Gastro-esophageal reflux disease without esophagitis: Secondary | ICD-10-CM | POA: Insufficient documentation

## 2022-10-31 DIAGNOSIS — G4730 Sleep apnea, unspecified: Secondary | ICD-10-CM | POA: Insufficient documentation

## 2022-10-31 DIAGNOSIS — Z7982 Long term (current) use of aspirin: Secondary | ICD-10-CM | POA: Diagnosis not present

## 2022-10-31 DIAGNOSIS — I2510 Atherosclerotic heart disease of native coronary artery without angina pectoris: Secondary | ICD-10-CM | POA: Diagnosis not present

## 2022-10-31 DIAGNOSIS — I10 Essential (primary) hypertension: Secondary | ICD-10-CM | POA: Diagnosis not present

## 2022-10-31 NOTE — Progress Notes (Signed)
Radiation Oncology         [phone] ________________________________  Initial Outpatient Consultation  Name: Andrew Larsen MRN: [id number]  Date: 10/31/2022  DOB: [birth year]  CC:Andrew Penna, MD  Andrew Matar, MD   REFERRING PHYSICIAN: Marcine Matar, MD  DIAGNOSIS: 82 y.o. gentleman with Stage T1 adenocarcinoma of the prostate with Gleason score of 4+3, and PSA of 5.59.    ICD-10-CM   1. Malignant neoplasm of prostate (HCC)  C61       HISTORY OF PRESENT ILLNESS: Andrew Larsen is a 82 y.o. male with a diagnosis of prostate cancer. Patient was originally diagnosed with low-volume favorable intermediate risk prostate cancer in June of 2023. Patient decided to proceed with active surveillance at that time. Repeat PSA on 04/05/23 was 4.53. Patient underwent an MRI of the prostate on 10/01/22 that showed a PI-RADS 4 lesion in the left posteromedial peripheral zone at the base and mid gland. Repeat PSA on 10/05/22 was 5.59. The patient proceeded to transrectal ultrasound with 12 biopsies of the prostate on 10/25/22.  The prostate volume measured 38 cc.  Out of 12 core biopsies, 3 were positive.  The maximum Gleason score was 4+3, and this was seen in the in the left mid lateral and left mid. Additionally, Gleason score of 3+4 was seen in the left base lateral.   The patient reviewed the biopsy results with his urologist and he has kindly been referred today for discussion of potential radiation treatment options.   PREVIOUS RADIATION THERAPY: No  PAST MEDICAL HISTORY:  Past Medical History:  Diagnosis Date   CKD (chronic kidney disease)    CKD stage III (01/2018)   Daytime somnolence    Diabetes mellitus without complication (HCC)    Fracture    right fibula/wears boot cast   GERD (gastroesophageal reflux disease)    Glaucoma    Hypercholesteremia    Hyperlipidemia    Hypertension    Mold exposure    PAD (peripheral artery disease) (HCC)    Sleep apnea        PAST SURGICAL HISTORY: Past Surgical History:  Procedure Laterality Date   LOWER EXTREMITY ANGIOGRAPHY N/A 06/12/2016   Procedure: Lower Extremity Angiography;  Surgeon: Yates Decamp, MD;  Location: [facility] INVASIVE CV LAB;  Service: Cardiovascular;  Laterality: N/A;   MUSCLE BIOPSY Right 05/06/2018   Procedure: right vastus lateralis muscle biopsy;  Surgeon: Julio Sicks, MD;  Location: [facility] OR;  Service: Neurosurgery;  Laterality: Right;   ORIF ANKLE FRACTURE Right 10/02/2012   Procedure: OPEN REDUCTION INTERNAL FIXATION (ORIF) ANKLE FRACTURE;  Surgeon: Cammy Copa, MD;  Location: [facility] ORS;  Service: Orthopedics;  Laterality: Right;   PERIPHERAL VASCULAR ATHERECTOMY Left 06/12/2016   Procedure: Peripheral Vascular Atherectomy-Left Popliteal;  Surgeon: Yates Decamp, MD;  Location: [facility] INVASIVE CV LAB;  Service: Cardiovascular;  Laterality: Left;   PERIPHERAL VASCULAR INTERVENTION Left 06/12/2016   Procedure: Peripheral Vascular Intervention- DCB Left Popliteal;  Surgeon: Yates Decamp, MD;  Location: [facility] INVASIVE CV LAB;  Service: Cardiovascular;  Laterality: Left;   PROSTATE BIOPSY      FAMILY HISTORY:  Family History  Problem Relation Age of Onset   Heart disease Father    Prostate cancer Brother    Cancer Brother    Diabetes Brother    Colon cancer Neg Hx    Sleep apnea Neg Hx     SOCIAL HISTORY:  Social History   Socioeconomic History   Marital status: Widowed    Spouse name:  Not on file   Number of children: 1   Years of education: Not on file   Highest education level: Not on file  Occupational History   Not on file  Tobacco Use   Smoking status: Former    Packs/day: 1.00    Years: 20.00    Additional pack years: 0.00    Total pack years: 20.00    Types: Cigarettes    Quit date: 08/22/1981    Years since quitting: 41.2   Smokeless tobacco: Never  Vaping Use   Vaping Use: Never used  Substance and Sexual Activity   Alcohol use: No    Alcohol/week: 0.0 standard drinks of  alcohol   Drug use: No   Sexual activity: Never  Other Topics Concern   Not on file  Social History Narrative   ** Merged History Encounter **       Social Determinants of Health   Financial Resource Strain: Not on file  Food Insecurity: No Food Insecurity (10/31/2022)   Hunger Vital Sign    Worried About Running Out of Food in the Last Year: Never true    Ran Out of Food in the Last Year: Never true  Transportation Needs: No Transportation Needs (10/31/2022)   PRAPARE - Administrator, Civil Service (Medical): No    Lack of Transportation (Non-Medical): No  Physical Activity: Not on file  Stress: Not on file  Social Connections: Not on file  Intimate Partner Violence: Not At Risk (10/31/2022)   Humiliation, Afraid, Rape, and Kick questionnaire    Fear of Current or Ex-Partner: No    Emotionally Abused: No    Physically Abused: No    Sexually Abused: No    ALLERGIES: Metformin and related, Pollen extract, and Simbrinza [brinzolamide-brimonidine]  MEDICATIONS:  Current Outpatient Medications  Medication Sig Dispense Refill   aspirin 81 MG chewable tablet Chew 81 mg by mouth at bedtime.      dorzolamide (TRUSOPT) 2 % ophthalmic solution Place 1 drop into the right eye 2 (two) times daily.     ezetimibe (ZETIA) 10 MG tablet TAKE 1 TABLET(10 MG) BY MOUTH DAILY AFTER SUPPER 90 tablet 3   furosemide (LASIX) 40 MG tablet Take 1 tablet (40 mg total) by mouth daily. (Patient taking differently: Take 40 mg by mouth 2 (two) times daily.) 30 tablet 0   gabapentin (NEURONTIN) 300 MG capsule Take 2 capsules (600 mg total) by mouth 3 (three) times daily. (Patient taking differently: Take 900 mg by mouth 3 (three) times daily.) 90 capsule 0   insulin aspart protamine - aspart (NOVOLOG MIX 70/30 FLEXPEN) (70-30) 100 UNIT/ML FlexPen Inject 0.36 mLs (36 Units total) into the skin daily with breakfast. (Patient taking differently: Inject 40 Units into the skin in the morning and at  bedtime.) 15 mL 11   JARDIANCE 10 MG TABS tablet Take 10 mg by mouth daily.     latanoprost (XALATAN) 0.005 % ophthalmic solution Place 1 drop into both eyes at bedtime.     losartan (COZAAR) 25 MG tablet TAKE 1 TABLET(25 MG) BY MOUTH DAILY (Patient taking differently: Take 25 mg by mouth daily.) 30 tablet 2   metoprolol tartrate (LOPRESSOR) 25 MG tablet Take 1 tablet (25 mg total) by mouth 2 (two) times daily. 60 tablet 0   pantoprazole (PROTONIX) 40 MG tablet TAKE 1 TABLET(40 MG) BY MOUTH DAILY BEFORE BREAKFAST AS NEEDED FOR COUGH OR ACID REFLUX 90 tablet 3   PRESCRIPTION MEDICATION CPAP- At bedtime  solifenacin (VESICARE) 5 MG tablet Take 5 mg by mouth daily.     tamsulosin (FLOMAX) 0.4 MG CAPS capsule Take 0.4 mg by mouth daily.     timolol (TIMOPTIC) 0.5 % ophthalmic solution Place 1 drop into the right eye 2 (two) times daily.     triamterene-hydrochlorothiazide (MAXZIDE-25) 37.5-25 MG tablet Take 1 tablet by mouth daily.     Continuous Blood Gluc Sensor (FREESTYLE LIBRE 2 SENSOR) MISC Inject 1 Device into the skin every 14 (fourteen) days.     Continuous Glucose Receiver (FREESTYLE LIBRE 14 DAY READER) DEVI FreeStyle Libre 14 Day Reader     Continuous Glucose Sensor (FREESTYLE LIBRE 14 DAY SENSOR) MISC FreeStyle Libre 14 Day Sensor     famotidine (PEPCID) 20 MG tablet Take 1 tablet (20 mg total) by mouth at bedtime. (Patient not taking: Reported on 10/31/2022) 30 tablet 0   potassium chloride SA (KLOR-CON M20) 20 MEQ tablet 1 tablet with food Orally Once a day     No current facility-administered medications for this encounter.    REVIEW OF SYSTEMS:  On review of systems, the patient reports that he is doing well overall. He denies any chest pain, shortness of breath, cough, fevers, chills, night sweats, unintended weight changes. He denies any bowel disturbances, and denies abdominal pain, nausea or vomiting. He denies any new musculoskeletal or joint aches or pains. His IPSS was 19,  indicating moderate urinary symptoms.  His SHIM was 5, indicating he does have severe erectile dysfunction. A complete review of systems is obtained and is otherwise negative.   PHYSICAL EXAM:  Wt Readings from Last 3 Encounters:  10/31/22 268 lb 4 oz (121.7 kg)  08/24/22 271 lb (122.9 kg)  02/15/22 266 lb 6.4 oz (120.8 kg)   Temp Readings from Last 3 Encounters:  10/31/22 (!) 97.3 F (36.3 C) (Temporal)  09/07/21 97.7 F (36.5 C) (Oral)  08/25/21 98 F (36.7 C) (Temporal)   BP Readings from Last 3 Encounters:  10/31/22 (!) 127/58  08/24/22 130/78  02/15/22 127/65   Pulse Readings from Last 3 Encounters:  10/31/22 63  08/24/22 60  02/15/22 75   Pain Assessment Pain Score: 0-No pain/10  In general this is a well appearing gentleman in no acute distress. He's alert and oriented x4 and appropriate throughout the examination. Cardiopulmonary assessment is negative for acute distress, and he exhibits normal effort.    KPS = 90  100 - Normal; no complaints; no evidence of disease. 90   - Able to carry on normal activity; minor signs or symptoms of disease. 80   - Normal activity with effort; some signs or symptoms of disease. 7   - Cares for self; unable to carry on normal activity or to do active work. 60   - Requires occasional assistance, but is able to care for most of his personal needs. 50   - Requires considerable assistance and frequent medical care. 40   - Disabled; requires special care and assistance. 30   - Severely disabled; hospital admission is indicated although death not imminent. 20   - Very sick; hospital admission necessary; active supportive treatment necessary. 10   - Moribund; fatal processes progressing rapidly. 0     - Dead  Karnofsky DA, Abelmann WH, Craver LS and Burchenal [facility] [phone]) The use of the nitrogen mustards in the palliative treatment of carcinoma: with particular reference to bronchogenic carcinoma Cancer 1 634-56  LABORATORY DATA:  Lab  Results  Component Value Date  WBC 8.2 09/06/2021   HGB 14.5 09/06/2021   HCT 44.3 09/06/2021   MCV 92.5 09/06/2021   PLT 192 09/06/2021   Lab Results  Component Value Date   NA 137 09/06/2021   K 4.2 09/06/2021   CL 101 09/06/2021   CO2 28 09/06/2021   Lab Results  Component Value Date   ALT 28 09/01/2021   AST 36 09/01/2021   ALKPHOS 109 09/01/2021   BILITOT 1.2 09/01/2021     RADIOGRAPHY: No results found.    IMPRESSION/PLAN: 1. 82 y.o. gentleman with Stage T1 adenocarcinoma of the prostate with Gleason Score of 4+3, and PSA of 5.59. We discussed the patient's workup and outlined the nature of prostate cancer in this setting. The patient's T stage, Gleason's score, and PSA put him into the unfavorable intermediate risk group. Accordingly, he is eligible for a variety of potential treatment options including brachytherapy, 5.5 weeks of external radiation, or prostatectomy. We discussed the available radiation techniques, and focused on the details and logistics of delivery. We discussed and outlined the risks, benefits, short and long-term effects associated with radiotherapy and compared and contrasted these with prostatectomy. We discussed the role of SpaceOAR gel in reducing the rectal toxicity associated with radiotherapy. He appears to have a good understanding of his disease and our treatment recommendations which are of curative intent.  He was encouraged to ask questions that were answered to his stated satisfaction.  At the conclusion of our conversation, the patient is interested in moving forward with brachytherapy. We discussed at length his increased risk for urinary retention given that he is already experiencing moderate urinary symptoms. Patient expressed understanding of this risk. We will work with our scheduling team to arrange his appointment for radioactive seed placement. We look forward to participating in this patient's care.   We personally spent 60 minutes  in this encounter including chart review, reviewing radiological studies, meeting face-to-face with the patient, entering orders and completing documentation.     Joyice Faster, PA-C    Margaretmary Dys, MD  [facility]  Radiation Oncology Direct Dial: [phone]  Fax: [phone] [email or web address]  Skype  LinkedIn

## 2022-11-02 ENCOUNTER — Telehealth: Payer: Self-pay | Admitting: *Deleted

## 2022-11-02 NOTE — Progress Notes (Signed)
Spoke with patient via telephone to introduce myself as the prostate nurse navigator and discussed my role.    Patient has now decided to proceed with 5.5 weeks of daily radiation vs brachytherapy due to increase risk of urinary retention.  I provided my contact information and asked for him to call me with questions or concerns.  Verbalized understanding.

## 2022-11-02 NOTE — Telephone Encounter (Signed)
CALLED PATIENT TO ASK QUESTIONS, SPOKE WITH PATIENT 

## 2022-11-04 DIAGNOSIS — C61 Malignant neoplasm of prostate: Secondary | ICD-10-CM | POA: Insufficient documentation

## 2022-11-04 DIAGNOSIS — Z191 Hormone sensitive malignancy status: Secondary | ICD-10-CM | POA: Diagnosis not present

## 2022-11-08 ENCOUNTER — Telehealth: Payer: Self-pay | Admitting: *Deleted

## 2022-11-08 NOTE — Telephone Encounter (Signed)
Returned patient's daughter's phone call Tamala Bari), spoke with Tamala Bari

## 2022-11-12 ENCOUNTER — Encounter (HOSPITAL_BASED_OUTPATIENT_CLINIC_OR_DEPARTMENT_OTHER): Payer: Self-pay | Admitting: Urology

## 2022-11-13 ENCOUNTER — Encounter (HOSPITAL_BASED_OUTPATIENT_CLINIC_OR_DEPARTMENT_OTHER): Payer: Self-pay | Admitting: Urology

## 2022-11-13 ENCOUNTER — Telehealth: Payer: Self-pay | Admitting: *Deleted

## 2022-11-13 ENCOUNTER — Other Ambulatory Visit: Payer: Self-pay

## 2022-11-13 ENCOUNTER — Encounter (HOSPITAL_COMMUNITY): Payer: Self-pay

## 2022-11-13 NOTE — Progress Notes (Addendum)
Spoke w/ via phone for pre-op interview---pt Lab needs dos----    I stat           Lab results------lov dr Jacinto Halim 08-24-2022 epic, stress test 06-15-2019 epic, echo 06-24-2019, EKG 08-24-2022 epic COVID test -----patient states asymptomatic no test needed Arrive at -------630 am 12-04-2022 NPO after MN NO Solid Food.  Clear liquids from MN until---530 am Med rec completed Medications to take morning of surgery -----gabapentin, metoprolol tartrate, pantoprazole, eye drop, solifenacin Diabetic medication -----take 1/2 dose of 70/30 insulin pm 12-03-2022 Patient instructed no nail polish to be worn day of surgery Patient instructed to bring photo id and insurance card day of surgery Patient aware to have Driver (ride ) / caregiver   daughter [name]  for 24 hours after surgery  Patient Special Instructions -----fleets enema at bedtime night before surgery Pre-Op special Instructions -----none Patient verbalized understanding of instructions that were given at this phone interview. Patient denies shortness of breath, chest pain, fever, cough at this phone interview.  Addendum: fax received to stop 81 mg asa 5 days before surgery per dr Muscab Brenneman [facility], fax placed on pt chart, spoke with pt and pt stated he received letter from dr Marlou Porch office and stopped 81 mg asa on 11-26-2022

## 2022-11-13 NOTE — Telephone Encounter (Signed)
CALLED PATIENT TO INFORM OF FID. MARKER AND SPACE OAR PLACEMENT ON 12-04-22 AND HIS SIM ON 12-07-22- ARRIVAL TIME- 2:45 PM @ [facility], INFORMED PATIENT TO ARRIVE WITH FULL BLADDER, SPOKE WITH PATIENT AND HE IS AWARE OF THESE APPTS. AND THE INSTRUCTIONS

## 2022-11-20 ENCOUNTER — Other Ambulatory Visit: Payer: Self-pay | Admitting: Urology

## 2022-11-22 DIAGNOSIS — Z01818 Encounter for other preprocedural examination: Secondary | ICD-10-CM | POA: Diagnosis not present

## 2022-11-22 DIAGNOSIS — N390 Urinary tract infection, site not specified: Secondary | ICD-10-CM | POA: Diagnosis not present

## 2022-11-22 DIAGNOSIS — C61 Malignant neoplasm of prostate: Secondary | ICD-10-CM | POA: Diagnosis not present

## 2022-11-23 DIAGNOSIS — H2511 Age-related nuclear cataract, right eye: Secondary | ICD-10-CM | POA: Diagnosis not present

## 2022-11-23 DIAGNOSIS — H5203 Hypermetropia, bilateral: Secondary | ICD-10-CM | POA: Diagnosis not present

## 2022-11-23 DIAGNOSIS — H401131 Primary open-angle glaucoma, bilateral, mild stage: Secondary | ICD-10-CM | POA: Diagnosis not present

## 2022-11-26 DIAGNOSIS — N3281 Overactive bladder: Secondary | ICD-10-CM | POA: Diagnosis not present

## 2022-11-26 DIAGNOSIS — I5032 Chronic diastolic (congestive) heart failure: Secondary | ICD-10-CM | POA: Diagnosis not present

## 2022-11-26 DIAGNOSIS — I130 Hypertensive heart and chronic kidney disease with heart failure and stage 1 through stage 4 chronic kidney disease, or unspecified chronic kidney disease: Secondary | ICD-10-CM | POA: Diagnosis not present

## 2022-11-26 DIAGNOSIS — E1169 Type 2 diabetes mellitus with other specified complication: Secondary | ICD-10-CM | POA: Diagnosis not present

## 2022-11-26 DIAGNOSIS — N1832 Chronic kidney disease, stage 3b: Secondary | ICD-10-CM | POA: Diagnosis not present

## 2022-11-26 DIAGNOSIS — C61 Malignant neoplasm of prostate: Secondary | ICD-10-CM | POA: Diagnosis not present

## 2022-11-26 DIAGNOSIS — Z01818 Encounter for other preprocedural examination: Secondary | ICD-10-CM | POA: Diagnosis not present

## 2022-11-26 DIAGNOSIS — E785 Hyperlipidemia, unspecified: Secondary | ICD-10-CM | POA: Diagnosis not present

## 2022-11-26 DIAGNOSIS — I700 Atherosclerosis of aorta: Secondary | ICD-10-CM | POA: Diagnosis not present

## 2022-11-26 DIAGNOSIS — G4733 Obstructive sleep apnea (adult) (pediatric): Secondary | ICD-10-CM | POA: Diagnosis not present

## 2022-12-04 ENCOUNTER — Ambulatory Visit (HOSPITAL_BASED_OUTPATIENT_CLINIC_OR_DEPARTMENT_OTHER): Admission: RE | Admit: 2022-12-04 | Payer: PPO | Source: Home / Self Care | Admitting: Urology

## 2022-12-04 DIAGNOSIS — C61 Malignant neoplasm of prostate: Secondary | ICD-10-CM

## 2022-12-04 DIAGNOSIS — Z01818 Encounter for other preprocedural examination: Secondary | ICD-10-CM

## 2022-12-04 HISTORY — DX: Dependence on other enabling machines and devices: Z99.89

## 2022-12-04 HISTORY — DX: Presence of spectacles and contact lenses: Z97.3

## 2022-12-04 HISTORY — DX: Malignant neoplasm of prostate: C61

## 2022-12-04 HISTORY — DX: Unspecified osteoarthritis, unspecified site: M19.90

## 2022-12-04 SURGERY — GOLD SEED IMPLANT
Anesthesia: Monitor Anesthesia Care

## 2022-12-04 NOTE — Progress Notes (Signed)
RN spoke with patient and he confirmed that fiducial's and spaceOAR were cancelled due to daughter testing positive for Covid and they reside together.   Fiducial's and spaceOAR to be rescheduled.  Pt aware that RN will follow to ensure CT Simulation gets rescheduled post fiducial's and spaceOAR.    No additional needs at this time.

## 2022-12-07 ENCOUNTER — Ambulatory Visit: Payer: PPO | Admitting: Radiation Oncology

## 2022-12-07 ENCOUNTER — Telehealth: Payer: Self-pay | Admitting: *Deleted

## 2022-12-07 NOTE — Telephone Encounter (Signed)
CALLED PATIENT TO INFORM THAT SIM APPT. HAS BEEN CANCELLED DUE TO HIS FID. MARKERS AND SPACE OAR BEING CANCELLED, SPOKE WITH PATIENT AND HE IS AWARE OF THIS

## 2022-12-13 NOTE — Progress Notes (Signed)
Patient has now been rescheduled for his fiducial's, spaceOAR, and CT Simulation. RN spoke with patient and confirmed he is aware of new dates.  No additional needs at this time.

## 2022-12-26 ENCOUNTER — Other Ambulatory Visit: Payer: Self-pay

## 2022-12-26 ENCOUNTER — Encounter (HOSPITAL_BASED_OUTPATIENT_CLINIC_OR_DEPARTMENT_OTHER): Payer: Self-pay | Admitting: Urology

## 2022-12-26 NOTE — Progress Notes (Signed)
Spoke w/ via phone for pre-op interview---pt and daughter [name] Lab needs dos----    I stat           Lab results------lov dr Jacinto Halim 08-24-2022 epic, stress test 06-15-2019 epic, echo 06-24-2019, EKG 08-24-2022 epic COVID test -----patient states asymptomatic no test needed Arrive at -------530 am 9-10--2024 NPO after MN NO Solid Food.  Clear liquids from MN until---430 am Med rec completed Medications to take morning of surgery -----gabapentin, metoprolol tartrate, pantoprazole, eye drop, vesicare, tamsulsin Diabetic medication -----take 1/2 dose of 70/30 insulin pm 01-07-2023 Patient instructed no nail polish to be worn day of surgery Patient instructed to bring photo id and insurance card day of surgery Patient aware to have Driver (ride ) / caregiver   daughter [name]  for 24 hours after surgery  Patient Special Instructions -----fleets enema at bedtime night before surgery Pre-Op special Instructions -----none Patient verbalized understanding of instructions that were given at this phone interview. Patient denies shortness of breath, chest pain, fever, cough at this phone interview.  Addendum: pt chart, spoke with pt and pt stated he received letter from dr Marlou Porch office and stopped 81 mg asa on 11-26-2022

## 2023-01-07 NOTE — Anesthesia Preprocedure Evaluation (Addendum)
Anesthesia Evaluation  Patient identified by MRN, date of birth, ID band Patient awake    Reviewed: Allergy & Precautions, NPO status , Patient's Chart, lab work & pertinent test results  Airway Mallampati: III  TM Distance: >3 FB Neck ROM: Full    Dental no notable dental hx.    Pulmonary asthma , sleep apnea and Continuous Positive Airway Pressure Ventilation , former smoker   Pulmonary exam normal breath sounds clear to auscultation       Cardiovascular hypertension, Pt. on medications + Peripheral Vascular Disease  Normal cardiovascular exam Rhythm:Regular Rate:Normal  ECG: NSR, rate 72   Neuro/Psych  Neuromuscular disease negative neurological ROS  negative psych ROS   GI/Hepatic Neg liver ROS,GERD  Medicated and Controlled,,  Endo/Other  diabetes, Type 2, Insulin Dependent  Morbid obesity  Renal/GU Renal Insufficiency and CRFRenal disease     Musculoskeletal  (+) Arthritis , Osteoarthritis,    Abdominal  (+) + obese  Peds  Hematology HLD   Anesthesia Other Findings muscle weakness  Reproductive/Obstetrics                             Anesthesia Physical Anesthesia Plan  ASA: 3  Anesthesia Plan: MAC   Post-op Pain Management: Minimal or no pain anticipated, Tylenol PO (pre-op)* and Celebrex PO (pre-op)*   Induction: Intravenous  PONV Risk Score and Plan: 2 and Ondansetron, Propofol infusion and Treatment may vary due to age or medical condition  Airway Management Planned: Natural Airway, Simple Face Mask and Mask  Additional Equipment: None  Intra-op Plan:   Post-operative Plan: Extubation in OR  Informed Consent: I have reviewed the patients History and Physical, chart, labs and discussed the procedure including the risks, benefits and alternatives for the proposed anesthesia with the patient or authorized representative who has indicated his/her understanding and  acceptance.     Dental advisory given  Plan Discussed with: CRNA and Anesthesiologist  Anesthesia Plan Comments: ( )        Anesthesia Quick Evaluation

## 2023-01-08 ENCOUNTER — Other Ambulatory Visit: Payer: Self-pay

## 2023-01-08 ENCOUNTER — Ambulatory Visit (HOSPITAL_BASED_OUTPATIENT_CLINIC_OR_DEPARTMENT_OTHER)
Admission: RE | Admit: 2023-01-08 | Discharge: 2023-01-08 | Disposition: A | Discharge status: Home / Self Care | Payer: PPO | Attending: Urology | Admitting: Urology

## 2023-01-08 ENCOUNTER — Ambulatory Visit (HOSPITAL_BASED_OUTPATIENT_CLINIC_OR_DEPARTMENT_OTHER): Payer: Self-pay | Admitting: Anesthesiology

## 2023-01-08 ENCOUNTER — Encounter (HOSPITAL_BASED_OUTPATIENT_CLINIC_OR_DEPARTMENT_OTHER)
Admission: RE | Disposition: A | Discharge status: Home / Self Care | Payer: Self-pay | Source: Home / Self Care | Attending: Urology

## 2023-01-08 ENCOUNTER — Ambulatory Visit (HOSPITAL_BASED_OUTPATIENT_CLINIC_OR_DEPARTMENT_OTHER): Payer: PPO | Admitting: Anesthesiology

## 2023-01-08 ENCOUNTER — Encounter (HOSPITAL_BASED_OUTPATIENT_CLINIC_OR_DEPARTMENT_OTHER): Payer: Self-pay | Admitting: Urology

## 2023-01-08 DIAGNOSIS — Z01818 Encounter for other preprocedural examination: Secondary | ICD-10-CM

## 2023-01-08 DIAGNOSIS — C61 Malignant neoplasm of prostate: Secondary | ICD-10-CM | POA: Diagnosis not present

## 2023-01-08 DIAGNOSIS — I129 Hypertensive chronic kidney disease with stage 1 through stage 4 chronic kidney disease, or unspecified chronic kidney disease: Secondary | ICD-10-CM | POA: Diagnosis not present

## 2023-01-08 DIAGNOSIS — K219 Gastro-esophageal reflux disease without esophagitis: Secondary | ICD-10-CM | POA: Insufficient documentation

## 2023-01-08 DIAGNOSIS — E1151 Type 2 diabetes mellitus with diabetic peripheral angiopathy without gangrene: Secondary | ICD-10-CM | POA: Insufficient documentation

## 2023-01-08 DIAGNOSIS — G4730 Sleep apnea, unspecified: Secondary | ICD-10-CM | POA: Insufficient documentation

## 2023-01-08 DIAGNOSIS — N183 Chronic kidney disease, stage 3 unspecified: Secondary | ICD-10-CM | POA: Insufficient documentation

## 2023-01-08 DIAGNOSIS — E1122 Type 2 diabetes mellitus with diabetic chronic kidney disease: Secondary | ICD-10-CM | POA: Diagnosis not present

## 2023-01-08 DIAGNOSIS — Z87891 Personal history of nicotine dependence: Secondary | ICD-10-CM | POA: Insufficient documentation

## 2023-01-08 DIAGNOSIS — N1832 Chronic kidney disease, stage 3b: Secondary | ICD-10-CM | POA: Diagnosis not present

## 2023-01-08 DIAGNOSIS — Z794 Long term (current) use of insulin: Secondary | ICD-10-CM | POA: Diagnosis not present

## 2023-01-08 HISTORY — PX: SPACE OAR INSTILLATION: SHX6769

## 2023-01-08 HISTORY — PX: GOLD SEED IMPLANT: SHX6343

## 2023-01-08 LAB — POCT I-STAT, CHEM 8
BUN: 22 mg/dL (ref 8–23)
Calcium, Ion: 1.15 mmol/L (ref 1.15–1.40)
Chloride: 100 mmol/L (ref 98–111)
Creatinine, Ser: 1.4 mg/dL — ABNORMAL HIGH (ref 0.61–1.24)
Glucose, Bld: 195 mg/dL — ABNORMAL HIGH (ref 70–99)
HCT: 45 % (ref 39.0–52.0)
Hemoglobin: 15.3 g/dL (ref 13.0–17.0)
Potassium: 4.3 mmol/L (ref 3.5–5.1)
Sodium: 138 mmol/L (ref 135–145)
TCO2: 30 mmol/L (ref 22–32)

## 2023-01-08 LAB — GLUCOSE, CAPILLARY: Glucose-Capillary: 170 mg/dL — ABNORMAL HIGH (ref 70–99)

## 2023-01-08 SURGERY — INSERTION, GOLD SEEDS
Anesthesia: Monitor Anesthesia Care

## 2023-01-08 MED ORDER — LIDOCAINE 2% (20 MG/ML) 5 ML SYRINGE
INTRAMUSCULAR | Status: DC | PRN
  Administered 2023-01-08: 100 mg via INTRAVENOUS

## 2023-01-08 MED ORDER — MEPERIDINE HCL 25 MG/ML IJ SOLN: 6.2500 mg | INTRAMUSCULAR | Status: DC | PRN

## 2023-01-08 MED ORDER — PROPOFOL 10 MG/ML IV BOLUS
INTRAVENOUS | Status: DC | PRN
  Administered 2023-01-08: 30 mg via INTRAVENOUS

## 2023-01-08 MED ORDER — ACETAMINOPHEN 160 MG/5ML PO SOLN: 325.0000 mg | ORAL | Status: DC | PRN

## 2023-01-08 MED ORDER — ONDANSETRON HCL 4 MG/2ML IJ SOLN: 4.0000 mg | Freq: Once | INTRAMUSCULAR | Status: DC | PRN

## 2023-01-08 MED ORDER — LIDOCAINE HCL (PF) 2 % IJ SOLN
INTRAMUSCULAR | Status: AC
  Filled 2023-01-08: qty 5

## 2023-01-08 MED ORDER — BUPIVACAINE HCL 0.25 % IJ SOLN
INTRAMUSCULAR | Status: DC | PRN
Start: 2023-01-08 — End: 2023-01-08
  Administered 2023-01-08: 5 mL

## 2023-01-08 MED ORDER — OXYCODONE HCL 5 MG/5ML PO SOLN: 5.0000 mg | Freq: Once | ORAL | Status: DC | PRN

## 2023-01-08 MED ORDER — ACETAMINOPHEN 500 MG PO TABS
1000.0000 mg | ORAL_TABLET | Freq: Once | ORAL | Status: AC
  Administered 2023-01-08: 1000 mg via ORAL

## 2023-01-08 MED ORDER — SODIUM CHLORIDE 0.9 % IV SOLN: INTRAVENOUS | Status: DC

## 2023-01-08 MED ORDER — SODIUM CHLORIDE (PF) 0.9 % IJ SOLN
INTRAMUSCULAR | Status: DC | PRN
  Administered 2023-01-08: 10 mL via INTRAVENOUS

## 2023-01-08 MED ORDER — ACETAMINOPHEN 325 MG PO TABS: 325.0000 mg | ORAL_TABLET | ORAL | Status: DC | PRN

## 2023-01-08 MED ORDER — PROPOFOL 500 MG/50ML IV EMUL
INTRAVENOUS | Status: DC | PRN
  Administered 2023-01-08: 150 ug/kg/min via INTRAVENOUS

## 2023-01-08 MED ORDER — FENTANYL CITRATE (PF) 100 MCG/2ML IJ SOLN: 25.0000 ug | INTRAMUSCULAR | Status: DC | PRN

## 2023-01-08 MED ORDER — LIDOCAINE HCL 1 % IJ SOLN
INTRAMUSCULAR | Status: DC | PRN
  Administered 2023-01-08: 5 mL

## 2023-01-08 MED ORDER — OXYCODONE HCL 5 MG PO TABS: 5.0000 mg | ORAL_TABLET | Freq: Once | ORAL | Status: DC | PRN

## 2023-01-08 MED ORDER — CEFAZOLIN SODIUM-DEXTROSE 2-4 GM/100ML-% IV SOLN
INTRAVENOUS | Status: AC
  Filled 2023-01-08: qty 100

## 2023-01-08 MED ORDER — ONDANSETRON HCL 4 MG/2ML IJ SOLN
INTRAMUSCULAR | Status: AC
  Filled 2023-01-08: qty 2

## 2023-01-08 MED ORDER — CEFAZOLIN SODIUM-DEXTROSE 1-4 GM/50ML-% IV SOLN
1.0000 g | Freq: Once | INTRAVENOUS | Status: AC
  Administered 2023-01-08: 2 g via INTRAVENOUS

## 2023-01-08 MED ORDER — ONDANSETRON HCL 4 MG/2ML IJ SOLN
INTRAMUSCULAR | Status: DC | PRN
  Administered 2023-01-08: 4 mg via INTRAVENOUS

## 2023-01-08 MED ORDER — PROPOFOL 500 MG/50ML IV EMUL
INTRAVENOUS | Status: AC
  Filled 2023-01-08: qty 50

## 2023-01-08 MED ORDER — ACETAMINOPHEN 500 MG PO TABS
ORAL_TABLET | ORAL | Status: AC
  Filled 2023-01-08: qty 2

## 2023-01-08 SURGICAL SUPPLY — 27 items
APL SKNCLS STERI-STRIP NONHPOA (GAUZE/BANDAGES/DRESSINGS)
BENZOIN TINCTURE PRP APPL 2/3 (GAUZE/BANDAGES/DRESSINGS) IMPLANT
BLADE CLIPPER SENSICLIP SURGIC (BLADE) ×1 IMPLANT
CNTNR URN SCR LID CUP LEK RST (MISCELLANEOUS) ×1 IMPLANT
CONT SPEC 4OZ STRL OR WHT (MISCELLANEOUS) ×1
COVER BACK TABLE 60X90IN (DRAPES) ×1 IMPLANT
DRSG TEGADERM 4X4.75 (GAUZE/BANDAGES/DRESSINGS) ×1 IMPLANT
DRSG TEGADERM 8X12 (GAUZE/BANDAGES/DRESSINGS) ×1 IMPLANT
GAUZE SPONGE 4X4 12PLY STRL (GAUZE/BANDAGES/DRESSINGS) ×1 IMPLANT
GAUZE SPONGE 4X4 3PLY NS LF (GAUZE/BANDAGES/DRESSINGS) IMPLANT
GLOVE BIO SURGEON STRL SZ7 (GLOVE) ×1 IMPLANT
IMPL SPACEOAR VUE SYSTEM (Spacer) IMPLANT
IMPLANT SPACEOAR VUE SYSTEM (Spacer) ×1 IMPLANT
KIT TURNOVER CYSTO (KITS) ×1 IMPLANT
MARKER GOLD PRELOAD 1.2X3 (Urological Implant) ×1 IMPLANT
MARKER SKIN DUAL TIP RULER LAB (MISCELLANEOUS) ×1 IMPLANT
NDL SPNL 22GX3.5 QUINCKE BK (NEEDLE) IMPLANT
NEEDLE SPNL 22GX3.5 QUINCKE BK (NEEDLE)
SEED GOLD PRELOAD 1.2X3 (Urological Implant) ×1 IMPLANT
SHEATH ULTRASOUND LF (SHEATH) IMPLANT
SHEATH ULTRASOUND LTX NONSTRL (SHEATH) IMPLANT
SLEEVE SCD COMPRESS KNEE MED (STOCKING) ×1 IMPLANT
SURGILUBE 2OZ TUBE FLIPTOP (MISCELLANEOUS) ×1 IMPLANT
SYR 10ML LL (SYRINGE) IMPLANT
SYR CONTROL 10ML LL (SYRINGE) ×1 IMPLANT
TOWEL OR 17X24 6PK STRL BLUE (TOWEL DISPOSABLE) ×1 IMPLANT
UNDERPAD 30X36 HEAVY ABSORB (UNDERPADS AND DIAPERS) ×1 IMPLANT

## 2023-01-08 NOTE — Anesthesia Postprocedure Evaluation (Signed)
Anesthesia Post Note  Patient: Andrew Larsen  Procedure(s) Performed: GOLD SEED IMPLANT SPACE OAR INSTILLATION     Patient location during evaluation: PACU Anesthesia Type: MAC Level of consciousness: awake and alert Pain management: pain level controlled Vital Signs Assessment: post-procedure vital signs reviewed and stable Respiratory status: spontaneous breathing, nonlabored ventilation, respiratory function stable and patient connected to nasal cannula oxygen Cardiovascular status: stable and blood pressure returned to baseline Postop Assessment: no apparent nausea or vomiting Anesthetic complications: no   No notable events documented.  Last Vitals:  Vitals:   01/08/23 0845 01/08/23 0905  BP:  (!) 154/75  Pulse: 64 (!) 59  Resp:  13  Temp:  (!) 36.3 C  SpO2: 100% 98%    Last Pain:  Vitals:   01/08/23 0905  TempSrc:   PainSc: 0-No pain   Pain Goal: Patients Stated Pain Goal: 4 (01/08/23 4010)                 Andrew Larsen

## 2023-01-08 NOTE — Discharge Instructions (Addendum)
You should avoid strenuous activities today but may resume all normal activities tomorrow.  2.   You can take Tylenol as needed for any pain or discomfort.  3.    Follow up with your radiation oncologist for your simulation appointment as scheduled.  If this is not currently scheduled or you do not know the date/time for that appointment, please contact the radiation oncology office to confirm.  You were given Tylenol prior to your procedure this morning per your Doctors Orders, please do not take any Tylenol until after 12 noon today.

## 2023-01-08 NOTE — H&P (Signed)
H&P  History of Present Illness: Andrew Larsen is a 82 y.o. year old M who presents today for gold seed placement and spaceOAR  No acute complaints  Past Medical History:  Diagnosis Date   Arthritis    CKD (chronic kidney disease)    CKD stage III (01/2018)   Daytime somnolence    Diabetes mellitus without complication (HCC)    Fracture    right fibula/wears boot cast   GERD (gastroesophageal reflux disease)    Glaucoma    Hypercholesteremia    Hyperlipidemia    Hypertension    Mold exposure    7 yrs ago   PAD (peripheral artery disease) (HCC)    Prostate cancer (HCC)    Sleep apnea    uses cpap   Uses walker    Wears glasses     Past Surgical History:  Procedure Laterality Date   LOWER EXTREMITY ANGIOGRAPHY N/A 06/12/2016   Procedure: Lower Extremity Angiography;  Surgeon: Yates Decamp, MD;  Location: [facility] INVASIVE CV LAB;  Service: Cardiovascular;  Laterality: N/A;   MUSCLE BIOPSY Right 05/06/2018   Procedure: right vastus lateralis muscle biopsy;  Surgeon: Julio Sicks, MD;  Location: [facility] OR;  Service: Neurosurgery;  Laterality: Right;   ORIF ANKLE FRACTURE Right 10/02/2012   Procedure: OPEN REDUCTION INTERNAL FIXATION (ORIF) ANKLE FRACTURE;  Surgeon: Cammy Copa, MD;  Location: [facility] ORS;  Service: Orthopedics;  Laterality: Right;   PERIPHERAL VASCULAR ATHERECTOMY Left 06/12/2016   Procedure: Peripheral Vascular Atherectomy-Left Popliteal;  Surgeon: Yates Decamp, MD;  Location: [facility]VASIVE CV LAB;  Service: Cardiovascular;  Laterality: Left;   PERIPHERAL VASCULAR INTERVENTION Left 06/12/2016   Procedure: Peripheral Vascular Intervention- DCB Left Popliteal;  Surgeon: Yates Decamp, MD;  Location: [place] INVASIVE CV LAB;  Service: Cardiovascular;  Laterality: Left;   PROSTATE BIOPSY      Home Medications:  Current Meds  Medication Sig   aspirin 81 MG chewable tablet Chew 81 mg by mouth at bedtime.    ezetimibe (ZETIA) 10 MG tablet TAKE 1 TABLET(10 MG) BY MOUTH DAILY AFTER  SUPPER   furosemide (LASIX) 40 MG tablet Take 1 tablet (40 mg total) by mouth daily. (Patient taking differently: Take 40 mg by mouth 2 (two) times daily.)   gabapentin (NEURONTIN) 300 MG capsule Take 2 capsules (600 mg total) by mouth 3 (three) times daily. (Patient taking differently: Take 900 mg by mouth 3 (three) times daily.)   insulin aspart protamine - aspart (NOVOLOG MIX 70/30 FLEXPEN) (70-30) 100 UNIT/ML FlexPen Inject 0.36 mLs (36 Units total) into the skin daily with breakfast. (Patient taking differently: Inject 40 Units into the skin in the morning and at bedtime.)   JARDIANCE 10 MG TABS tablet Take 10 mg by mouth daily.   losartan (COZAAR) 25 MG tablet TAKE 1 TABLET(25 MG) BY MOUTH DAILY (Patient taking differently: Take 25 mg by mouth daily.)   MAGNESIUM PO Take by mouth.   metoprolol tartrate (LOPRESSOR) 25 MG tablet Take 1 tablet (25 mg total) by mouth 2 (two) times daily.   Multiple Vitamin (MULTIVITAMIN WITH MINERALS) TABS tablet Take 1 tablet by mouth daily.   pantoprazole (PROTONIX) 40 MG tablet TAKE 1 TABLET(40 MG) BY MOUTH DAILY BEFORE BREAKFAST AS NEEDED FOR COUGH OR ACID REFLUX   Potassium 99 MG TABS Take by mouth.   potassium chloride SA (KLOR-CON M20) 20 MEQ tablet 1 tablet with food Orally Once a day   solifenacin (VESICARE) 5 MG tablet Take 5 mg by mouth daily.  tamsulosin (FLOMAX) 0.4 MG CAPS capsule Take 0.4 mg by mouth daily.   triamterene-hydrochlorothiazide (MAXZIDE-25) 37.5-25 MG tablet Take 1 tablet by mouth daily.    Allergies:  Allergies  Allergen Reactions   Metformin And Related Other (See Comments)    Reaction?   Pollen Extract Other (See Comments)   Simbrinza [Brinzolamide-Brimonidine] Other (See Comments)    Drainage, redness to eyes    Family History  Problem Relation Age of Onset   Heart disease Father    Prostate cancer Brother    Cancer Brother    Diabetes Brother    Colon cancer Neg Hx    Sleep apnea Neg Hx     Social History:   reports that he quit smoking about 41 years ago. His smoking use included cigarettes. He started smoking about 61 years ago. He has a 20 pack-year smoking history. He has never used smokeless tobacco. He reports that he does not drink alcohol and does not use drugs.  ROS: A complete review of systems was performed.  All systems are negative except for pertinent findings as noted.  Physical Exam:  Vital signs in last 24 hours: Temp:  [97.6 F (36.4 C)] 97.6 F (36.4 C) (09/10 0629) Pulse Rate:  [66] 66 (09/10 0629) Resp:  [16] 16 (09/10 0629) BP: (163)/(89) 163/89 (09/10 0629) SpO2:  [94 %] 94 % (09/10 0629) Weight:  [121.6 kg] 121.6 kg (09/10 0629) Constitutional:  Alert and oriented, No acute distress Cardiovascular: Regular rate and rhythm Respiratory: Normal respiratory effort, Lungs clear bilaterally GI: Abdomen is soft, nontender, nondistended, no abdominal masses Lymphatic: No lymphadenopathy Neurologic: Grossly intact, no focal deficits Psychiatric: Normal mood and affect   Laboratory Data:  Recent Labs    01/08/23 0623  HGB 15.3  HCT 45.0    Recent Labs    01/08/23 0623  NA 138  K 4.3  CL 100  GLUCOSE 195*  BUN 22  CREATININE 1.40*     Results for orders placed or performed during the hospital encounter of 01/08/23 (from the past 24 hour(s))  I-STAT, chem 8     Status: Abnormal   Collection Time: 01/08/23  6:23 AM  Result Value Ref Range   Sodium 138 135 - 145 mmol/L   Potassium 4.3 3.5 - 5.1 mmol/L   Chloride 100 98 - 111 mmol/L   BUN 22 8 - 23 mg/dL   Creatinine, Ser 9.48 (H) 0.61 - 1.24 mg/dL   Glucose, Bld 546 (H) 70 - 99 mg/dL   Calcium, Ion 2.70 3.50 - 1.40 mmol/L   TCO2 30 22 - 32 mmol/L   Hemoglobin 15.3 13.0 - 17.0 g/dL   HCT 09.3 81.8 - 29.9 %   No results found for this or any previous visit (from the past 240 hour(s)).  Renal Function: Recent Labs    01/08/23 3716  CREATININE 1.40*   Estimated Creatinine Clearance: 55.8 mL/min (A)  (by C-G formula based on SCr of 1.4 mg/dL (H)).  Radiologic Imaging: No results found.  Assessment:  Andrew Larsen is a 82 y.o. year old M with prostate cancer planning to undergo radiation treatment  Plan:  To OR as planned. Procedure and risks reviewed  Irine Seal, MD 01/08/2023, 7:24 AM  [facility] Pager: [phone]

## 2023-01-08 NOTE — Transfer of Care (Signed)
Immediate Anesthesia Transfer of Care Note  Patient: Andrew Larsen  Procedure(s) Performed: GOLD SEED IMPLANT SPACE OAR INSTILLATION  Patient Location: PACU  Anesthesia Type:MAC  Level of Consciousness: sedated  Airway & Oxygen Therapy:O2 FM w OPA  Post-op Assessment: Report given to RN  Post vital signs: Reviewed and stable  Last Vitals:  Vitals Value Taken Time  BP 99/62   Temp    Pulse 69 01/08/23 0810  Resp 15   SpO2 97 % 01/08/23 0810  Vitals shown include unfiled device data.  Last Pain:  Vitals:   01/08/23 0629  TempSrc: Oral  PainSc: 0-No pain      Patients Stated Pain Goal: 4 (01/08/23 4098)  Complications: No notable events documented.

## 2023-01-08 NOTE — Anesthesia Procedure Notes (Signed)
Procedure Name: MAC Date/Time: 01/08/2023 7:30 AM  Performed by: Briant Sites, CRNAPre-anesthesia Checklist: Patient identified, Emergency Drugs available, Suction available, Patient being monitored and Timeout performed Oxygen Delivery Method: Simple face mask Placement Confirmation: positive ETCO2

## 2023-01-08 NOTE — Op Note (Signed)
Preoperative diagnosis: Clinically localized adenocarcinoma of the prostate   Postoperative diagnosis: Clinically localized adenocarcinoma of the prostate  Procedure: 1) Placement of fiducial markers into prostate                    2) Insertion of SpaceOAR hydrogel   Surgeon:Luke Dakarai Mcglocklin. M.D.  Anesthesia: General  EBL: Minimal  Complications: None  Indication: Andrew Larsen is a 82 y.o. gentleman with clinically localized prostate cancer. After discussing management options for treatment, he elected to proceed with radiotherapy. He presents today for the above procedures. The potential risks, complications, alternative options, and expected recovery course have been discussed in detail with the patient and he has provided informed consent to proceed.  Description of procedure: The patient was administered preoperative antibiotics, placed in the dorsal lithotomy position, and prepped and draped in the usual sterile fashion. Next, transrectal ultrasonography was utilized to visualize the prostate. Three gold fiducial markers were then placed into the prostate via transperineal needles under ultrasound guidance at the left apex, left base, and right mid gland under direct ultrasound guidance. A site in the midline was then selected on the perineum for placement of an 18 g needle with saline. The needle was advanced above the rectum and below Denonvillier's fascia to the mid gland and confirmed to be in the midline on transverse imaging. One cc of saline was injected confirming appropriate expansion of this space. A total of 5 cc of saline was then injected to open the space further bilaterally. The saline syringe was then removed and the SpaceOAR hydrogel was injected with good distribution bilaterally. He tolerated the procedure well and without complications. He was given a voiding trial prior to discharge from the PACU.

## 2023-01-09 ENCOUNTER — Encounter (HOSPITAL_BASED_OUTPATIENT_CLINIC_OR_DEPARTMENT_OTHER): Payer: Self-pay | Admitting: Urology

## 2023-01-11 ENCOUNTER — Telehealth: Payer: Self-pay | Admitting: *Deleted

## 2023-01-11 NOTE — Telephone Encounter (Signed)
Called patient to remind of sim appt. for 01-14-23- arrival time- 9:45 am @ [facility], informed patient to arrive with a full bladder, spoke with patient and he is aware of this appt. and the instructions

## 2023-01-14 ENCOUNTER — Ambulatory Visit
Admission: RE | Admit: 2023-01-14 | Discharge: 2023-01-14 | Disposition: A | Discharge status: Home / Self Care | Payer: PPO | Source: Ambulatory Visit | Attending: Radiation Oncology | Admitting: Radiation Oncology

## 2023-01-14 DIAGNOSIS — C61 Malignant neoplasm of prostate: Secondary | ICD-10-CM

## 2023-01-14 DIAGNOSIS — Z191 Hormone sensitive malignancy status: Secondary | ICD-10-CM | POA: Diagnosis not present

## 2023-01-14 NOTE — Progress Notes (Signed)
Radiation Oncology         ([phone]) [phone] ________________________________  Name: Andrew Larsen MRN: [id number]  Date: 01/14/2023  DOB: 1940/09/07  SIMULATION AND TREATMENT PLANNING NOTE    ICD-10-CM   1. Malignant neoplasm of prostate (HCC)  C61       DIAGNOSIS:  82 y.o. gentleman with Stage T1c adenocarcinoma of the prostate with Gleason score of 4+3, and PSA of 5.59.  NARRATIVE:  The patient was brought to the CT Simulation planning suite.  Identity was confirmed.  All relevant records and images related to the planned course of therapy were reviewed.  The patient freely provided informed written consent to proceed with treatment after reviewing the details related to the planned course of therapy. The consent form was witnessed and verified by the simulation staff.  Then, the patient was set-up in a stable reproducible supine position for radiation therapy.  A vacuum lock pillow device was custom fabricated to position his legs in a reproducible immobilized position.  Then, supervised the performance of a urethrogram under sterile conditions to identify the prostatic apex.  CT images were obtained.  Surface markings were placed.  The CT images were loaded into the planning software.  Then the prostate target and avoidance structures including the rectum, bladder, bowel and hips were contoured.  Treatment planning then occurred.  The radiation prescription was entered and confirmed.  A total of one complex treatment devices was fabricated. I have requested : Intensity Modulated Radiotherapy (IMRT) is medically necessary for this case for the following reason:  Rectal sparing.  I have requested daily cone beam CT volumetric image gudiance to track gold fiducial posiitoning along with bladder and rectal filling, this is medically necessary to assure accurate positioning of high dose radiation.  PLAN:  The patient will receive 70 Gy in 28 fractions.  ________________________________  Artist Pais  Kathrynn Running, M.D.

## 2023-01-15 DIAGNOSIS — C61 Malignant neoplasm of prostate: Secondary | ICD-10-CM | POA: Diagnosis not present

## 2023-01-15 DIAGNOSIS — Z191 Hormone sensitive malignancy status: Secondary | ICD-10-CM | POA: Diagnosis not present

## 2023-01-29 ENCOUNTER — Encounter: Payer: Self-pay | Admitting: Adult Health

## 2023-01-29 ENCOUNTER — Ambulatory Visit: Payer: PPO | Admitting: Adult Health

## 2023-01-29 VITALS — BP 122/75 | HR 76 | Ht 71.0 in | Wt 269.2 lb

## 2023-01-29 DIAGNOSIS — G7241 Inclusion body myositis [IBM]: Secondary | ICD-10-CM | POA: Diagnosis not present

## 2023-01-29 DIAGNOSIS — G4733 Obstructive sleep apnea (adult) (pediatric): Secondary | ICD-10-CM

## 2023-01-29 NOTE — Patient Instructions (Signed)
Continue using CPAP nightly and greater than 4 hours each night Will adjust pressure to 12 cm h20 and ramp to 5 If your symptoms worsen or you develop new symptoms please let us know.

## 2023-01-29 NOTE — Progress Notes (Signed)
PATIENT: Andrew Larsen DOB: May 31, 1940  REASON FOR VISIT: follow up HISTORY FROM: patient  Chief Complaint  Patient presents with   Follow-up    Pt in 19 Pt here for CPAP f/u Pt states not enough air pressure coming mask Pt states feels like he is suffocating      HISTORY OF PRESENT ILLNESS: Today 01/29/23:  Andrew Larsen is a 82 y.o. male with a history of obstructive apnea on CPAP. Returns today for follow-up.  He reports that the CPAP works well although he still feels that he is not getting enough it pressure.  At the Larsen visit I asked for his pressure to be changed to 12 however this was never done by his DME company.  We will again send an order to adjust the pressure  When the patient initially saw Dr. Frances Furbish a muscle biopsy was performed that showed significant peripheral neuropathy.  He was referred to [facility] for evaluation.  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  His Larsen follow-up was in 2020.  He is wife states that they were not set up for any additional appointments.  However, she is not sure that the patient needs to follow-up there as there is not a lot of treatment options.  She would like to keep his neurology care locally.  She states that he does have muscle weakness and has falls.  His wife would like him to do another round of physical therapy.       01/28/22: Andrew Larsen is a 82 year old male with a history of obstructive sleep apnea on CPAP.  He returns today for follow-up.  His download indicates that he uses machine 28 out of 30 days for compliance of 93.3%.  He used it greater than 4 hours for compliance of 90%.  Average usage is 5 hours and 38 minutes.  His residual AHI is 5.1 on 11 cm of water with a ramp pressure of 4 cm of water.  He states that he does not feel that he is getting enough air.  Previous machine was set at 12 cm of water.  02/15/21: Andrew Larsen is a 82 year old male with a history of obstructive sleep apnea on CPAP.  He  returns today for follow-up.  He reports that the CPAP is working well.  He denies any new issues.  He returns today for an evaluation.    08/15/20: Andrew Larsen is a 82 year old male with a history of obstructive sleep apnea on CPAP.  His download indicates that he uses machine 24 out of 30 days for compliance of 80%.  He uses machine greater than 4 hours for compliance of 60%.  On average he uses his machine 4 hours and 41 minutes.  His residual AHI is 6.9 on 11 cm of water.  Reports that some night he has to take the machine off because he feels as if he is not getting enough pressure.  He returns today for an evaluation.  HISTORY 02/15/20: He reports more weakness in his legs.  He fell a few weeks ago as he lost his balance but he also was not using his walker.  He has been using a 2 wheeled walker consistently.  He was finally diagnosed with inclusion body myositis in July Larsen year.  Unfortunately, he contracted COVID-19 and was hospitalized from November 03 2018 through August third 2020 and subsequent inpatient rehab from August third 2020 through August 24th 2020.  He is followed by cardiology and pulmonology.  He has not seen his [facility] neurologist in several months.  He would like to get some outpatient physical therapy.  He has not used his CPAP machine after he found out that his machine was on recall.  He has registered his machine on the website but has not talked to his DME company about a replacement yet.  He has used an ozone based cleaning machine on his CPAP machine and is advised to no longer use it for now until we have a better sense for when he gets a replacement machine.  He does admit that he does not sleep as well without his machine.     REVIEW OF SYSTEMS: Out of a complete 14 system review of symptoms, the patient complains only of the following symptoms, and all other reviewed systems are negative.   ESS 13  ALLERGIES: Allergies  Allergen Reactions   Metformin And Related Other  (See Comments)    Reaction?   Pollen Extract Other (See Comments)   Simbrinza [Brinzolamide-Brimonidine] Other (See Comments)    Drainage, redness to eyes    HOME MEDICATIONS: Outpatient Medications Prior to Visit  Medication Sig Dispense Refill   aspirin 81 MG chewable tablet Chew 81 mg by mouth at bedtime.      Continuous Blood Gluc Sensor (FREESTYLE LIBRE 2 SENSOR) MISC Inject 1 Device into the skin every 14 (fourteen) days.     dorzolamide (TRUSOPT) 2 % ophthalmic solution Place 1 drop into the right eye 2 (two) times daily.     ezetimibe (ZETIA) 10 MG tablet TAKE 1 TABLET(10 MG) BY MOUTH DAILY AFTER SUPPER 90 tablet 3   furosemide (LASIX) 40 MG tablet Take 1 tablet (40 mg total) by mouth daily. (Patient taking differently: Take 40 mg by mouth 2 (two) times daily.) 30 tablet 0   gabapentin (NEURONTIN) 300 MG capsule Take 2 capsules (600 mg total) by mouth 3 (three) times daily. (Patient taking differently: Take 900 mg by mouth 3 (three) times daily.) 90 capsule 0   JARDIANCE 10 MG TABS tablet Take 10 mg by mouth daily.     latanoprost (XALATAN) 0.005 % ophthalmic solution Place 1 drop into both eyes at bedtime.     losartan (COZAAR) 25 MG tablet TAKE 1 TABLET(25 MG) BY MOUTH DAILY (Patient taking differently: Take 25 mg by mouth daily.) 30 tablet 2   metoprolol tartrate (LOPRESSOR) 25 MG tablet Take 1 tablet (25 mg total) by mouth 2 (two) times daily. 60 tablet 0   Multiple Vitamin (MULTIVITAMIN WITH MINERALS) TABS tablet Take 1 tablet by mouth daily.     PRESCRIPTION MEDICATION CPAP- At bedtime     solifenacin (VESICARE) 5 MG tablet Take 5 mg by mouth daily.     tamsulosin (FLOMAX) 0.4 MG CAPS capsule Take 0.4 mg by mouth daily.     timolol (TIMOPTIC) 0.5 % ophthalmic solution Place 1 drop into the right eye 2 (two) times daily.     triamterene-hydrochlorothiazide (MAXZIDE-25) 37.5-25 MG tablet Take 1 tablet by mouth daily.     Continuous Glucose Receiver (FREESTYLE LIBRE 14 DAY  READER) DEVI FreeStyle Libre 14 Day Reader     Continuous Glucose Sensor (FREESTYLE LIBRE 14 DAY SENSOR) MISC FreeStyle Libre 14 Day Sensor     insulin aspart protamine - aspart (NOVOLOG MIX 70/30 FLEXPEN) (70-30) 100 UNIT/ML FlexPen Inject 0.36 mLs (36 Units total) into the skin daily with breakfast. (Patient taking differently: Inject 40 Units into the skin in the morning and at bedtime.) 15 mL  11   MAGNESIUM PO Take by mouth.     pantoprazole (PROTONIX) 40 MG tablet TAKE 1 TABLET(40 MG) BY MOUTH DAILY BEFORE BREAKFAST AS NEEDED FOR COUGH OR ACID REFLUX 90 tablet 3   Potassium 99 MG TABS Take by mouth.     potassium chloride SA (KLOR-CON M20) 20 MEQ tablet 1 tablet with food Orally Once a day     No facility-administered medications prior to visit.    PAST MEDICAL HISTORY: Past Medical History:  Diagnosis Date   Arthritis    CKD (chronic kidney disease)    CKD stage III (01/2018)   Daytime somnolence    Diabetes mellitus without complication (HCC)    Fracture    right fibula/wears boot cast   GERD (gastroesophageal reflux disease)    Glaucoma    Hypercholesteremia    Hyperlipidemia    Hypertension    Mold exposure    7 yrs ago   PAD (peripheral artery disease) (HCC)    Prostate cancer (HCC)    Sleep apnea    uses cpap   Uses walker    Wears glasses     PAST SURGICAL HISTORY: Past Surgical History:  Procedure Laterality Date   GOLD SEED IMPLANT N/A 01/08/2023   Procedure: GOLD SEED IMPLANT;  Surgeon: Despina Arias, MD;  Location: [facility];  Service: Urology;  Laterality: N/A;  30 MINUTES   LOWER EXTREMITY ANGIOGRAPHY N/A 06/12/2016   Procedure: Lower Extremity Angiography;  Surgeon: Yates Decamp, MD;  Location: [facility] INVASIVE CV LAB;  Service: Cardiovascular;  Laterality: N/A;   MUSCLE BIOPSY Right 05/06/2018   Procedure: right vastus lateralis muscle biopsy;  Surgeon: Julio Sicks, MD;  Location: [facility] OR;  Service: Neurosurgery;  Laterality: Right;   ORIF  ANKLE FRACTURE Right 10/02/2012   Procedure: OPEN REDUCTION INTERNAL FIXATION (ORIF) ANKLE FRACTURE;  Surgeon: Cammy Copa, MD;  Location: [facility] ORS;  Service: Orthopedics;  Laterality: Right;   PERIPHERAL VASCULAR ATHERECTOMY Left 06/12/2016   Procedure: Peripheral Vascular Atherectomy-Left Popliteal;  Surgeon: Yates Decamp, MD;  Location: [facility] INVASIVE CV LAB;  Service: Cardiovascular;  Laterality: Left;   PERIPHERAL VASCULAR INTERVENTION Left 06/12/2016   Procedure: Peripheral Vascular Intervention- DCB Left Popliteal;  Surgeon: Yates Decamp, MD;  Location: [facility] INVASIVE CV LAB;  Service: Cardiovascular;  Laterality: Left;   PROSTATE BIOPSY     SPACE OAR INSTILLATION N/A 01/08/2023   Procedure: SPACE OAR INSTILLATION;  Surgeon: Despina Arias, MD;  Location: [facility];  Service: Urology;  Laterality: N/A;    FAMILY HISTORY: Family History  Problem Relation Age of Onset   Heart disease Father    Prostate cancer Brother    Cancer Brother    Diabetes Brother    Colon cancer Neg Hx    Sleep apnea Neg Hx     SOCIAL HISTORY: Social History   Socioeconomic History   Marital status: Widowed    Spouse name: Not on file   Number of children: 1   Years of education: Not on file   Highest education level: Not on file  Occupational History   Not on file  Tobacco Use   Smoking status: Former    Current packs/day: 0.00    Average packs/day: 1 pack/day for 20.0 years (20.0 ttl pk-yrs)    Types: Cigarettes    Start date: 08/22/1961    Quit date: 08/22/1981    Years since quitting: 41.4   Smokeless tobacco: Never  Vaping Use   Vaping status:  Never Used  Substance and Sexual Activity   Alcohol use: No    Alcohol/week: 0.0 standard drinks of alcohol   Drug use: No   Sexual activity: Never  Other Topics Concern   Not on file  Social History Narrative   ** Merged History Encounter **       Social Determinants of Health   Financial Resource Strain: Not on file  Food  Insecurity: No Food Insecurity (10/31/2022)   Hunger Vital Sign    Worried About Running Out of Food in the Larsen Year: Never true    Ran Out of Food in the Larsen Year: Never true  Transportation Needs: No Transportation Needs (10/31/2022)   PRAPARE - Administrator, Civil Service (Medical): No    Lack of Transportation (Non-Medical): No  Physical Activity: Not on file  Stress: Not on file  Social Connections: Not on file  Intimate Partner Violence: Not At Risk (10/31/2022)   Humiliation, Afraid, Rape, and Kick questionnaire    Fear of Current or Ex-Partner: No    Emotionally Abused: No    Physically Abused: No    Sexually Abused: No      PHYSICAL EXAM  Vitals:   01/29/23 0851  BP: 122/75  Pulse: 76  Weight: 269 lb 3.2 oz (122.1 kg)  Height: 5\' 11"  (1.803 m)    Body mass index is 37.55 kg/m.  Generalized: Well developed, in no acute distress  Chest: Lungs clear to auscultation bilaterally  Neurological examination  Mentation: Alert oriented to time, place, history taking. Follows all commands speech and language fluent Cranial nerve II-XII: Extraocular movements were full, visual field were full on confrontational test Head turning and shoulder shrug  were normal and symmetric. Gait and station: uses a rollator when ambulating    DIAGNOSTIC DATA (LABS, IMAGING, TESTING) - I reviewed patient records, labs, notes, testing and imaging myself where available.  Lab Results  Component Value Date   WBC 8.2 09/06/2021   HGB 15.3 01/08/2023   HCT 45.0 01/08/2023   MCV 92.5 09/06/2021   PLT 192 09/06/2021      Component Value Date/Time   NA 138 01/08/2023 0623   NA 142 02/15/2020 0921   K 4.3 01/08/2023 0623   CL 100 01/08/2023 0623   CO2 28 09/06/2021 0839   GLUCOSE 195 (H) 01/08/2023 0623   BUN 22 01/08/2023 0623   BUN 21 02/15/2020 0921   CREATININE 1.40 (H) 01/08/2023 0623   CALCIUM 9.0 09/06/2021 0839   PROT 7.4 09/01/2021 1511   PROT 6.1 07/13/2019  0833   ALBUMIN 3.5 09/01/2021 1511   ALBUMIN 3.9 07/13/2019 0833   AST 36 09/01/2021 1511   ALT 28 09/01/2021 1511   ALKPHOS 109 09/01/2021 1511   BILITOT 1.2 09/01/2021 1511   BILITOT 0.3 07/13/2019 0833   GFRNONAA >60 09/06/2021 0839   GFRAA 49 (L) 02/15/2020 0921   Lab Results  Component Value Date   CHOL 157 02/15/2020   HDL 49 02/15/2020   LDLCALC 95 02/15/2020   TRIG 67 02/15/2020   CHOLHDL 2.7 11/08/2018   Lab Results  Component Value Date   HGBA1C 8.4 (H) 09/02/2021   No results found for: "VITAMINB12" Lab Results  Component Value Date   TSH 3.910 07/13/2019      ASSESSMENT AND PLAN 82 y.o. year old male  has a past medical history of Arthritis, CKD (chronic kidney disease), Daytime somnolence, Diabetes mellitus without complication (HCC), Fracture, GERD (gastroesophageal reflux disease), Glaucoma,  Hypercholesteremia, Hyperlipidemia, Hypertension, Mold exposure, PAD (peripheral artery disease) (HCC), Prostate cancer (HCC), Sleep apnea, Uses walker, and Wears glasses. here with:  OSA on CPAP   - CPAP compliance excellent - Residual AHI in normal range - We will increase pressure to 12 cm of H20 and change ramp to 5 - Encourage patient to use CPAP nightly and > 4 hours each night  2. Inclusion body myositis  -Today's visit was a follow-up for CPAP however he mentions that he is no longer going to [facility] for inclusion body myositis.  He does state that he would like to keep a local neurologist.  Wife is asking that I placed a referral for physical therapy. -I did discuss with Dr. Frances Furbish.  Dr. Frances Furbish would like to ask for a second opinion from one of our neuromuscular specialist. -Referral placed for physical therapy  Follow-up in 1 year for CPAP.    Butch Penny, MSN, NP-C 01/29/2023, 9:15 AM [facility] [address], [address] [place], [place] [address] [phone]

## 2023-01-30 ENCOUNTER — Ambulatory Visit
Admission: RE | Admit: 2023-01-30 | Discharge: 2023-01-30 | Disposition: A | Discharge status: Home / Self Care | Payer: PPO | Source: Ambulatory Visit | Attending: Radiation Oncology | Admitting: Radiation Oncology

## 2023-01-30 ENCOUNTER — Other Ambulatory Visit: Payer: Self-pay

## 2023-01-30 DIAGNOSIS — Z191 Hormone sensitive malignancy status: Secondary | ICD-10-CM | POA: Diagnosis not present

## 2023-01-30 DIAGNOSIS — C61 Malignant neoplasm of prostate: Secondary | ICD-10-CM | POA: Insufficient documentation

## 2023-01-30 DIAGNOSIS — Z510 Encounter for antineoplastic radiation therapy: Secondary | ICD-10-CM | POA: Diagnosis not present

## 2023-01-30 LAB — RAD ONC ARIA SESSION SUMMARY
Course Elapsed Days: 0
Plan Fractions Treated to Date: 1
Plan Prescribed Dose Per Fraction: 2.5 Gy
Plan Total Fractions Prescribed: 28
Plan Total Prescribed Dose: 70 Gy
Reference Point Dosage Given to Date: 2.5 Gy
Reference Point Session Dosage Given: 2.5 Gy
Session Number: 1

## 2023-01-30 NOTE — Progress Notes (Addendum)
Pressure increase order sent to [facility]. [facility] confirmed receipt of order 01/30/2023 11:15 am.

## 2023-01-31 ENCOUNTER — Other Ambulatory Visit: Payer: Self-pay

## 2023-01-31 ENCOUNTER — Ambulatory Visit
Admission: RE | Admit: 2023-01-31 | Discharge: 2023-01-31 | Disposition: A | Discharge status: Home / Self Care | Payer: PPO | Source: Ambulatory Visit | Attending: Radiation Oncology | Admitting: Radiation Oncology

## 2023-01-31 DIAGNOSIS — C61 Malignant neoplasm of prostate: Secondary | ICD-10-CM | POA: Diagnosis not present

## 2023-01-31 DIAGNOSIS — Z191 Hormone sensitive malignancy status: Secondary | ICD-10-CM | POA: Diagnosis not present

## 2023-01-31 DIAGNOSIS — Z510 Encounter for antineoplastic radiation therapy: Secondary | ICD-10-CM | POA: Diagnosis not present

## 2023-01-31 LAB — RAD ONC ARIA SESSION SUMMARY
Course Elapsed Days: 1
Plan Fractions Treated to Date: 2
Plan Prescribed Dose Per Fraction: 2.5 Gy
Plan Total Fractions Prescribed: 28
Plan Total Prescribed Dose: 70 Gy
Reference Point Dosage Given to Date: 5 Gy
Reference Point Session Dosage Given: 2.5 Gy
Session Number: 2

## 2023-02-01 ENCOUNTER — Other Ambulatory Visit: Payer: Self-pay

## 2023-02-01 ENCOUNTER — Ambulatory Visit
Admission: RE | Admit: 2023-02-01 | Discharge: 2023-02-01 | Disposition: A | Discharge status: Home / Self Care | Payer: PPO | Source: Ambulatory Visit | Attending: Radiation Oncology | Admitting: Radiation Oncology

## 2023-02-01 ENCOUNTER — Ambulatory Visit
Admission: RE | Admit: 2023-02-01 | Discharge: 2023-02-01 | Disposition: A | Discharge status: Home / Self Care | Payer: PPO | Source: Ambulatory Visit | Attending: Radiation Oncology

## 2023-02-01 DIAGNOSIS — Z510 Encounter for antineoplastic radiation therapy: Secondary | ICD-10-CM | POA: Diagnosis not present

## 2023-02-01 DIAGNOSIS — Z191 Hormone sensitive malignancy status: Secondary | ICD-10-CM | POA: Diagnosis not present

## 2023-02-01 DIAGNOSIS — C61 Malignant neoplasm of prostate: Secondary | ICD-10-CM | POA: Diagnosis not present

## 2023-02-01 LAB — RAD ONC ARIA SESSION SUMMARY
Course Elapsed Days: 2
Plan Fractions Treated to Date: 3
Plan Prescribed Dose Per Fraction: 2.5 Gy
Plan Total Fractions Prescribed: 28
Plan Total Prescribed Dose: 70 Gy
Reference Point Dosage Given to Date: 7.5 Gy
Reference Point Session Dosage Given: 2.5 Gy
Session Number: 3

## 2023-02-04 ENCOUNTER — Other Ambulatory Visit: Payer: Self-pay

## 2023-02-04 ENCOUNTER — Ambulatory Visit
Admission: RE | Admit: 2023-02-04 | Discharge: 2023-02-04 | Disposition: A | Discharge status: Home / Self Care | Payer: PPO | Source: Ambulatory Visit | Attending: Radiation Oncology | Admitting: Radiation Oncology

## 2023-02-04 DIAGNOSIS — C61 Malignant neoplasm of prostate: Secondary | ICD-10-CM | POA: Diagnosis not present

## 2023-02-04 DIAGNOSIS — Z510 Encounter for antineoplastic radiation therapy: Secondary | ICD-10-CM | POA: Diagnosis not present

## 2023-02-04 DIAGNOSIS — Z191 Hormone sensitive malignancy status: Secondary | ICD-10-CM | POA: Diagnosis not present

## 2023-02-04 LAB — RAD ONC ARIA SESSION SUMMARY
Course Elapsed Days: 5
Plan Fractions Treated to Date: 4
Plan Prescribed Dose Per Fraction: 2.5 Gy
Plan Total Fractions Prescribed: 28
Plan Total Prescribed Dose: 70 Gy
Reference Point Dosage Given to Date: 10 Gy
Reference Point Session Dosage Given: 2.5 Gy
Session Number: 4

## 2023-02-05 ENCOUNTER — Other Ambulatory Visit: Payer: Self-pay

## 2023-02-05 ENCOUNTER — Ambulatory Visit
Admission: RE | Admit: 2023-02-05 | Discharge: 2023-02-05 | Disposition: A | Discharge status: Home / Self Care | Payer: PPO | Source: Ambulatory Visit | Attending: Radiation Oncology | Admitting: Radiation Oncology

## 2023-02-05 DIAGNOSIS — Z510 Encounter for antineoplastic radiation therapy: Secondary | ICD-10-CM | POA: Diagnosis not present

## 2023-02-05 DIAGNOSIS — Z191 Hormone sensitive malignancy status: Secondary | ICD-10-CM | POA: Diagnosis not present

## 2023-02-05 DIAGNOSIS — C61 Malignant neoplasm of prostate: Secondary | ICD-10-CM | POA: Diagnosis not present

## 2023-02-05 LAB — RAD ONC ARIA SESSION SUMMARY
Course Elapsed Days: 6
Plan Fractions Treated to Date: 5
Plan Prescribed Dose Per Fraction: 2.5 Gy
Plan Total Fractions Prescribed: 28
Plan Total Prescribed Dose: 70 Gy
Reference Point Dosage Given to Date: 12.5 Gy
Reference Point Session Dosage Given: 2.5 Gy
Session Number: 5

## 2023-02-06 ENCOUNTER — Ambulatory Visit
Admission: RE | Admit: 2023-02-06 | Discharge: 2023-02-06 | Disposition: A | Discharge status: Home / Self Care | Payer: PPO | Source: Ambulatory Visit | Attending: Radiation Oncology | Admitting: Radiation Oncology

## 2023-02-06 ENCOUNTER — Other Ambulatory Visit: Payer: Self-pay

## 2023-02-06 DIAGNOSIS — Z510 Encounter for antineoplastic radiation therapy: Secondary | ICD-10-CM | POA: Diagnosis not present

## 2023-02-06 DIAGNOSIS — Z191 Hormone sensitive malignancy status: Secondary | ICD-10-CM | POA: Diagnosis not present

## 2023-02-06 DIAGNOSIS — C61 Malignant neoplasm of prostate: Secondary | ICD-10-CM | POA: Diagnosis not present

## 2023-02-06 LAB — RAD ONC ARIA SESSION SUMMARY
Course Elapsed Days: 7
Plan Fractions Treated to Date: 6
Plan Prescribed Dose Per Fraction: 2.5 Gy
Plan Total Fractions Prescribed: 28
Plan Total Prescribed Dose: 70 Gy
Reference Point Dosage Given to Date: 15 Gy
Reference Point Session Dosage Given: 2.5 Gy
Session Number: 6

## 2023-02-07 ENCOUNTER — Other Ambulatory Visit: Payer: Self-pay

## 2023-02-07 ENCOUNTER — Ambulatory Visit
Admission: RE | Admit: 2023-02-07 | Discharge: 2023-02-07 | Disposition: A | Discharge status: Home / Self Care | Payer: PPO | Source: Ambulatory Visit | Attending: Radiation Oncology | Admitting: Radiation Oncology

## 2023-02-07 DIAGNOSIS — G4733 Obstructive sleep apnea (adult) (pediatric): Secondary | ICD-10-CM | POA: Diagnosis not present

## 2023-02-07 DIAGNOSIS — C61 Malignant neoplasm of prostate: Secondary | ICD-10-CM | POA: Diagnosis not present

## 2023-02-07 DIAGNOSIS — Z510 Encounter for antineoplastic radiation therapy: Secondary | ICD-10-CM | POA: Diagnosis not present

## 2023-02-07 DIAGNOSIS — Z191 Hormone sensitive malignancy status: Secondary | ICD-10-CM | POA: Diagnosis not present

## 2023-02-07 LAB — RAD ONC ARIA SESSION SUMMARY
Course Elapsed Days: 8
Plan Fractions Treated to Date: 7
Plan Prescribed Dose Per Fraction: 2.5 Gy
Plan Total Fractions Prescribed: 28
Plan Total Prescribed Dose: 70 Gy
Reference Point Dosage Given to Date: 17.5 Gy
Reference Point Session Dosage Given: 2.5 Gy
Session Number: 7

## 2023-02-08 ENCOUNTER — Other Ambulatory Visit: Payer: Self-pay

## 2023-02-08 ENCOUNTER — Ambulatory Visit
Admission: RE | Admit: 2023-02-08 | Discharge: 2023-02-08 | Disposition: A | Discharge status: Home / Self Care | Payer: PPO | Source: Ambulatory Visit | Attending: Radiation Oncology | Admitting: Radiation Oncology

## 2023-02-08 DIAGNOSIS — Z510 Encounter for antineoplastic radiation therapy: Secondary | ICD-10-CM | POA: Diagnosis not present

## 2023-02-08 DIAGNOSIS — C61 Malignant neoplasm of prostate: Secondary | ICD-10-CM | POA: Diagnosis not present

## 2023-02-08 DIAGNOSIS — Z191 Hormone sensitive malignancy status: Secondary | ICD-10-CM | POA: Diagnosis not present

## 2023-02-08 LAB — RAD ONC ARIA SESSION SUMMARY
Course Elapsed Days: 9
Plan Fractions Treated to Date: 8
Plan Prescribed Dose Per Fraction: 2.5 Gy
Plan Total Fractions Prescribed: 28
Plan Total Prescribed Dose: 70 Gy
Reference Point Dosage Given to Date: 20 Gy
Reference Point Session Dosage Given: 2.5 Gy
Session Number: 8

## 2023-02-11 ENCOUNTER — Ambulatory Visit: Payer: PPO | Admitting: Physical Therapy

## 2023-02-11 ENCOUNTER — Ambulatory Visit
Admission: RE | Admit: 2023-02-11 | Discharge: 2023-02-11 | Disposition: A | Discharge status: Home / Self Care | Payer: PPO | Source: Ambulatory Visit | Attending: Radiation Oncology | Admitting: Radiation Oncology

## 2023-02-11 ENCOUNTER — Other Ambulatory Visit: Payer: Self-pay

## 2023-02-11 DIAGNOSIS — C61 Malignant neoplasm of prostate: Secondary | ICD-10-CM | POA: Diagnosis not present

## 2023-02-11 DIAGNOSIS — Z510 Encounter for antineoplastic radiation therapy: Secondary | ICD-10-CM | POA: Diagnosis not present

## 2023-02-11 DIAGNOSIS — Z191 Hormone sensitive malignancy status: Secondary | ICD-10-CM | POA: Diagnosis not present

## 2023-02-11 LAB — RAD ONC ARIA SESSION SUMMARY
Course Elapsed Days: 12
Plan Fractions Treated to Date: 9
Plan Prescribed Dose Per Fraction: 2.5 Gy
Plan Total Fractions Prescribed: 28
Plan Total Prescribed Dose: 70 Gy
Reference Point Dosage Given to Date: 22.5 Gy
Reference Point Session Dosage Given: 2.5 Gy
Session Number: 9

## 2023-02-12 ENCOUNTER — Other Ambulatory Visit: Payer: Self-pay

## 2023-02-12 ENCOUNTER — Telehealth: Payer: Self-pay | Admitting: Adult Health

## 2023-02-12 ENCOUNTER — Ambulatory Visit
Admission: RE | Admit: 2023-02-12 | Discharge: 2023-02-12 | Disposition: A | Discharge status: Home / Self Care | Payer: PPO | Source: Ambulatory Visit | Attending: Radiation Oncology

## 2023-02-12 DIAGNOSIS — Z191 Hormone sensitive malignancy status: Secondary | ICD-10-CM | POA: Diagnosis not present

## 2023-02-12 DIAGNOSIS — C61 Malignant neoplasm of prostate: Secondary | ICD-10-CM | POA: Diagnosis not present

## 2023-02-12 DIAGNOSIS — Z510 Encounter for antineoplastic radiation therapy: Secondary | ICD-10-CM | POA: Diagnosis not present

## 2023-02-12 LAB — RAD ONC ARIA SESSION SUMMARY
Course Elapsed Days: 13
Plan Fractions Treated to Date: 10
Plan Prescribed Dose Per Fraction: 2.5 Gy
Plan Total Fractions Prescribed: 28
Plan Total Prescribed Dose: 70 Gy
Reference Point Dosage Given to Date: 25 Gy
Reference Point Session Dosage Given: 2.5 Gy
Session Number: 10

## 2023-02-12 NOTE — Telephone Encounter (Signed)
In response to a Cc'd chart note, phone rep called pt to schedule the f/u with Dr Terrace Arabia for June of 2025, a brief vm was left asking pt to call to schedule the needed appointment.

## 2023-02-12 NOTE — Telephone Encounter (Signed)
Noted  

## 2023-02-13 ENCOUNTER — Other Ambulatory Visit: Payer: Self-pay

## 2023-02-13 ENCOUNTER — Ambulatory Visit
Admission: RE | Admit: 2023-02-13 | Discharge: 2023-02-13 | Disposition: A | Discharge status: Home / Self Care | Payer: PPO | Source: Ambulatory Visit | Attending: Radiation Oncology | Admitting: Radiation Oncology

## 2023-02-13 DIAGNOSIS — Z510 Encounter for antineoplastic radiation therapy: Secondary | ICD-10-CM | POA: Diagnosis not present

## 2023-02-13 DIAGNOSIS — C61 Malignant neoplasm of prostate: Secondary | ICD-10-CM | POA: Diagnosis not present

## 2023-02-13 DIAGNOSIS — Z191 Hormone sensitive malignancy status: Secondary | ICD-10-CM | POA: Diagnosis not present

## 2023-02-13 LAB — RAD ONC ARIA SESSION SUMMARY
Course Elapsed Days: 14
Plan Fractions Treated to Date: 11
Plan Prescribed Dose Per Fraction: 2.5 Gy
Plan Total Fractions Prescribed: 28
Plan Total Prescribed Dose: 70 Gy
Reference Point Dosage Given to Date: 27.5 Gy
Reference Point Session Dosage Given: 2.5 Gy
Session Number: 11

## 2023-02-14 ENCOUNTER — Ambulatory Visit
Admission: RE | Admit: 2023-02-14 | Discharge: 2023-02-14 | Disposition: A | Discharge status: Home / Self Care | Payer: PPO | Source: Ambulatory Visit | Attending: Radiation Oncology | Admitting: Radiation Oncology

## 2023-02-14 ENCOUNTER — Ambulatory Visit: Payer: PPO | Admitting: Adult Health

## 2023-02-14 ENCOUNTER — Other Ambulatory Visit: Payer: Self-pay

## 2023-02-14 DIAGNOSIS — C61 Malignant neoplasm of prostate: Secondary | ICD-10-CM | POA: Diagnosis not present

## 2023-02-14 DIAGNOSIS — Z510 Encounter for antineoplastic radiation therapy: Secondary | ICD-10-CM | POA: Diagnosis not present

## 2023-02-14 DIAGNOSIS — Z191 Hormone sensitive malignancy status: Secondary | ICD-10-CM | POA: Diagnosis not present

## 2023-02-14 LAB — RAD ONC ARIA SESSION SUMMARY
Course Elapsed Days: 15
Plan Fractions Treated to Date: 12
Plan Prescribed Dose Per Fraction: 2.5 Gy
Plan Total Fractions Prescribed: 28
Plan Total Prescribed Dose: 70 Gy
Reference Point Dosage Given to Date: 30 Gy
Reference Point Session Dosage Given: 2.5 Gy
Session Number: 12

## 2023-02-15 ENCOUNTER — Other Ambulatory Visit: Payer: Self-pay

## 2023-02-15 ENCOUNTER — Ambulatory Visit
Admission: RE | Admit: 2023-02-15 | Discharge: 2023-02-15 | Disposition: A | Discharge status: Home / Self Care | Payer: PPO | Source: Ambulatory Visit | Attending: Radiation Oncology

## 2023-02-15 DIAGNOSIS — C61 Malignant neoplasm of prostate: Secondary | ICD-10-CM | POA: Diagnosis not present

## 2023-02-15 DIAGNOSIS — Z510 Encounter for antineoplastic radiation therapy: Secondary | ICD-10-CM | POA: Diagnosis not present

## 2023-02-15 DIAGNOSIS — Z191 Hormone sensitive malignancy status: Secondary | ICD-10-CM | POA: Diagnosis not present

## 2023-02-15 LAB — RAD ONC ARIA SESSION SUMMARY
Course Elapsed Days: 16
Plan Fractions Treated to Date: 13
Plan Prescribed Dose Per Fraction: 2.5 Gy
Plan Total Fractions Prescribed: 28
Plan Total Prescribed Dose: 70 Gy
Reference Point Dosage Given to Date: 32.5 Gy
Reference Point Session Dosage Given: 2.5 Gy
Session Number: 13

## 2023-02-18 ENCOUNTER — Ambulatory Visit
Admission: RE | Admit: 2023-02-18 | Discharge: 2023-02-18 | Disposition: A | Discharge status: Home / Self Care | Payer: PPO | Source: Ambulatory Visit | Attending: Radiation Oncology

## 2023-02-18 ENCOUNTER — Other Ambulatory Visit: Payer: Self-pay

## 2023-02-18 DIAGNOSIS — Z191 Hormone sensitive malignancy status: Secondary | ICD-10-CM | POA: Diagnosis not present

## 2023-02-18 DIAGNOSIS — Z510 Encounter for antineoplastic radiation therapy: Secondary | ICD-10-CM | POA: Diagnosis not present

## 2023-02-18 DIAGNOSIS — C61 Malignant neoplasm of prostate: Secondary | ICD-10-CM | POA: Diagnosis not present

## 2023-02-18 LAB — RAD ONC ARIA SESSION SUMMARY
Course Elapsed Days: 19
Plan Fractions Treated to Date: 14
Plan Prescribed Dose Per Fraction: 2.5 Gy
Plan Total Fractions Prescribed: 28
Plan Total Prescribed Dose: 70 Gy
Reference Point Dosage Given to Date: 35 Gy
Reference Point Session Dosage Given: 2.5 Gy
Session Number: 14

## 2023-02-19 ENCOUNTER — Other Ambulatory Visit: Payer: Self-pay

## 2023-02-19 ENCOUNTER — Ambulatory Visit
Admission: RE | Admit: 2023-02-19 | Discharge: 2023-02-19 | Disposition: A | Discharge status: Home / Self Care | Payer: PPO | Source: Ambulatory Visit | Attending: Radiation Oncology | Admitting: Radiation Oncology

## 2023-02-19 DIAGNOSIS — Z510 Encounter for antineoplastic radiation therapy: Secondary | ICD-10-CM | POA: Diagnosis not present

## 2023-02-19 DIAGNOSIS — C61 Malignant neoplasm of prostate: Secondary | ICD-10-CM | POA: Diagnosis not present

## 2023-02-19 DIAGNOSIS — Z191 Hormone sensitive malignancy status: Secondary | ICD-10-CM | POA: Diagnosis not present

## 2023-02-19 LAB — RAD ONC ARIA SESSION SUMMARY
Course Elapsed Days: 20
Plan Fractions Treated to Date: 15
Plan Prescribed Dose Per Fraction: 2.5 Gy
Plan Total Fractions Prescribed: 28
Plan Total Prescribed Dose: 70 Gy
Reference Point Dosage Given to Date: 37.5 Gy
Reference Point Session Dosage Given: 2.5 Gy
Session Number: 15

## 2023-02-20 ENCOUNTER — Ambulatory Visit
Admission: RE | Admit: 2023-02-20 | Discharge: 2023-02-20 | Disposition: A | Discharge status: Home / Self Care | Payer: PPO | Source: Ambulatory Visit | Attending: Radiation Oncology

## 2023-02-20 ENCOUNTER — Other Ambulatory Visit: Payer: Self-pay

## 2023-02-20 ENCOUNTER — Other Ambulatory Visit: Payer: Self-pay | Admitting: Cardiology

## 2023-02-20 DIAGNOSIS — K219 Gastro-esophageal reflux disease without esophagitis: Secondary | ICD-10-CM

## 2023-02-20 DIAGNOSIS — Z510 Encounter for antineoplastic radiation therapy: Secondary | ICD-10-CM | POA: Diagnosis not present

## 2023-02-20 DIAGNOSIS — C61 Malignant neoplasm of prostate: Secondary | ICD-10-CM | POA: Diagnosis not present

## 2023-02-20 DIAGNOSIS — Z191 Hormone sensitive malignancy status: Secondary | ICD-10-CM | POA: Diagnosis not present

## 2023-02-20 LAB — RAD ONC ARIA SESSION SUMMARY
Course Elapsed Days: 21
Plan Fractions Treated to Date: 16
Plan Prescribed Dose Per Fraction: 2.5 Gy
Plan Total Fractions Prescribed: 28
Plan Total Prescribed Dose: 70 Gy
Reference Point Dosage Given to Date: 40 Gy
Reference Point Session Dosage Given: 2.5 Gy
Session Number: 16

## 2023-02-21 ENCOUNTER — Other Ambulatory Visit: Payer: Self-pay

## 2023-02-21 ENCOUNTER — Ambulatory Visit
Admission: RE | Admit: 2023-02-21 | Discharge: 2023-02-21 | Disposition: A | Discharge status: Home / Self Care | Payer: PPO | Source: Ambulatory Visit | Attending: Radiation Oncology | Admitting: Radiation Oncology

## 2023-02-21 DIAGNOSIS — Z510 Encounter for antineoplastic radiation therapy: Secondary | ICD-10-CM | POA: Diagnosis not present

## 2023-02-21 DIAGNOSIS — Z191 Hormone sensitive malignancy status: Secondary | ICD-10-CM | POA: Diagnosis not present

## 2023-02-21 DIAGNOSIS — C61 Malignant neoplasm of prostate: Secondary | ICD-10-CM | POA: Diagnosis not present

## 2023-02-21 LAB — RAD ONC ARIA SESSION SUMMARY
Course Elapsed Days: 22
Plan Fractions Treated to Date: 17
Plan Prescribed Dose Per Fraction: 2.5 Gy
Plan Total Fractions Prescribed: 28
Plan Total Prescribed Dose: 70 Gy
Reference Point Dosage Given to Date: 42.5 Gy
Reference Point Session Dosage Given: 2.5 Gy
Session Number: 17

## 2023-02-22 ENCOUNTER — Ambulatory Visit
Admission: RE | Admit: 2023-02-22 | Discharge: 2023-02-22 | Disposition: A | Discharge status: Home / Self Care | Payer: PPO | Source: Ambulatory Visit | Attending: Radiation Oncology | Admitting: Radiation Oncology

## 2023-02-22 ENCOUNTER — Other Ambulatory Visit: Payer: Self-pay

## 2023-02-22 DIAGNOSIS — Z510 Encounter for antineoplastic radiation therapy: Secondary | ICD-10-CM | POA: Diagnosis not present

## 2023-02-22 DIAGNOSIS — Z191 Hormone sensitive malignancy status: Secondary | ICD-10-CM | POA: Diagnosis not present

## 2023-02-22 DIAGNOSIS — C61 Malignant neoplasm of prostate: Secondary | ICD-10-CM | POA: Diagnosis not present

## 2023-02-22 LAB — RAD ONC ARIA SESSION SUMMARY
Course Elapsed Days: 23
Plan Fractions Treated to Date: 18
Plan Prescribed Dose Per Fraction: 2.5 Gy
Plan Total Fractions Prescribed: 28
Plan Total Prescribed Dose: 70 Gy
Reference Point Dosage Given to Date: 45 Gy
Reference Point Session Dosage Given: 2.5 Gy
Session Number: 18

## 2023-02-25 ENCOUNTER — Ambulatory Visit
Admission: RE | Admit: 2023-02-25 | Discharge: 2023-02-25 | Disposition: A | Discharge status: Home / Self Care | Payer: PPO | Source: Ambulatory Visit | Attending: Radiation Oncology | Admitting: Radiation Oncology

## 2023-02-25 ENCOUNTER — Other Ambulatory Visit: Payer: Self-pay

## 2023-02-25 DIAGNOSIS — Z191 Hormone sensitive malignancy status: Secondary | ICD-10-CM | POA: Diagnosis not present

## 2023-02-25 DIAGNOSIS — Z510 Encounter for antineoplastic radiation therapy: Secondary | ICD-10-CM | POA: Diagnosis not present

## 2023-02-25 DIAGNOSIS — C61 Malignant neoplasm of prostate: Secondary | ICD-10-CM | POA: Diagnosis not present

## 2023-02-25 LAB — RAD ONC ARIA SESSION SUMMARY
Course Elapsed Days: 26
Plan Fractions Treated to Date: 19
Plan Prescribed Dose Per Fraction: 2.5 Gy
Plan Total Fractions Prescribed: 28
Plan Total Prescribed Dose: 70 Gy
Reference Point Dosage Given to Date: 47.5 Gy
Reference Point Session Dosage Given: 2.5 Gy
Session Number: 19

## 2023-02-26 ENCOUNTER — Ambulatory Visit
Admission: RE | Admit: 2023-02-26 | Discharge: 2023-02-26 | Disposition: A | Discharge status: Home / Self Care | Payer: PPO | Source: Ambulatory Visit | Attending: Radiation Oncology

## 2023-02-26 ENCOUNTER — Other Ambulatory Visit: Payer: Self-pay

## 2023-02-26 DIAGNOSIS — C61 Malignant neoplasm of prostate: Secondary | ICD-10-CM | POA: Diagnosis not present

## 2023-02-26 DIAGNOSIS — Z510 Encounter for antineoplastic radiation therapy: Secondary | ICD-10-CM | POA: Diagnosis not present

## 2023-02-26 DIAGNOSIS — Z191 Hormone sensitive malignancy status: Secondary | ICD-10-CM | POA: Diagnosis not present

## 2023-02-26 LAB — RAD ONC ARIA SESSION SUMMARY
Course Elapsed Days: 27
Plan Fractions Treated to Date: 20
Plan Prescribed Dose Per Fraction: 2.5 Gy
Plan Total Fractions Prescribed: 28
Plan Total Prescribed Dose: 70 Gy
Reference Point Dosage Given to Date: 50 Gy
Reference Point Session Dosage Given: 2.5 Gy
Session Number: 20

## 2023-02-27 ENCOUNTER — Other Ambulatory Visit: Payer: Self-pay

## 2023-02-27 ENCOUNTER — Ambulatory Visit
Admission: RE | Admit: 2023-02-27 | Discharge: 2023-02-27 | Disposition: A | Discharge status: Home / Self Care | Payer: PPO | Source: Ambulatory Visit | Attending: Radiation Oncology | Admitting: Radiation Oncology

## 2023-02-27 DIAGNOSIS — C61 Malignant neoplasm of prostate: Secondary | ICD-10-CM | POA: Diagnosis not present

## 2023-02-27 DIAGNOSIS — Z191 Hormone sensitive malignancy status: Secondary | ICD-10-CM | POA: Diagnosis not present

## 2023-02-27 DIAGNOSIS — Z510 Encounter for antineoplastic radiation therapy: Secondary | ICD-10-CM | POA: Diagnosis not present

## 2023-02-27 LAB — RAD ONC ARIA SESSION SUMMARY
Course Elapsed Days: 28
Plan Fractions Treated to Date: 21
Plan Prescribed Dose Per Fraction: 2.5 Gy
Plan Total Fractions Prescribed: 28
Plan Total Prescribed Dose: 70 Gy
Reference Point Dosage Given to Date: 52.5 Gy
Reference Point Session Dosage Given: 2.5 Gy
Session Number: 21

## 2023-02-28 ENCOUNTER — Ambulatory Visit
Admission: RE | Admit: 2023-02-28 | Discharge: 2023-02-28 | Disposition: A | Discharge status: Home / Self Care | Payer: PPO | Source: Ambulatory Visit | Attending: Radiation Oncology | Admitting: Radiation Oncology

## 2023-02-28 ENCOUNTER — Other Ambulatory Visit: Payer: Self-pay

## 2023-02-28 DIAGNOSIS — C61 Malignant neoplasm of prostate: Secondary | ICD-10-CM | POA: Diagnosis not present

## 2023-02-28 DIAGNOSIS — Z510 Encounter for antineoplastic radiation therapy: Secondary | ICD-10-CM | POA: Diagnosis not present

## 2023-02-28 DIAGNOSIS — Z191 Hormone sensitive malignancy status: Secondary | ICD-10-CM | POA: Diagnosis not present

## 2023-02-28 LAB — RAD ONC ARIA SESSION SUMMARY
Course Elapsed Days: 29
Plan Fractions Treated to Date: 22
Plan Prescribed Dose Per Fraction: 2.5 Gy
Plan Total Fractions Prescribed: 28
Plan Total Prescribed Dose: 70 Gy
Reference Point Dosage Given to Date: 55 Gy
Reference Point Session Dosage Given: 2.5 Gy
Session Number: 22

## 2023-03-01 ENCOUNTER — Ambulatory Visit
Admission: RE | Admit: 2023-03-01 | Discharge: 2023-03-01 | Disposition: A | Discharge status: Home / Self Care | Payer: PPO | Source: Ambulatory Visit | Attending: Radiation Oncology

## 2023-03-01 ENCOUNTER — Ambulatory Visit
Admission: RE | Admit: 2023-03-01 | Discharge: 2023-03-01 | Disposition: A | Discharge status: Home / Self Care | Payer: PPO | Source: Ambulatory Visit | Attending: Radiation Oncology | Admitting: Radiation Oncology

## 2023-03-01 ENCOUNTER — Other Ambulatory Visit: Payer: Self-pay

## 2023-03-01 DIAGNOSIS — C61 Malignant neoplasm of prostate: Secondary | ICD-10-CM | POA: Insufficient documentation

## 2023-03-01 DIAGNOSIS — Z510 Encounter for antineoplastic radiation therapy: Secondary | ICD-10-CM | POA: Diagnosis not present

## 2023-03-01 DIAGNOSIS — Z191 Hormone sensitive malignancy status: Secondary | ICD-10-CM | POA: Diagnosis not present

## 2023-03-01 LAB — RAD ONC ARIA SESSION SUMMARY
Course Elapsed Days: 30
Plan Fractions Treated to Date: 23
Plan Prescribed Dose Per Fraction: 2.5 Gy
Plan Total Fractions Prescribed: 28
Plan Total Prescribed Dose: 70 Gy
Reference Point Dosage Given to Date: 57.5 Gy
Reference Point Session Dosage Given: 2.5 Gy
Session Number: 23

## 2023-03-04 ENCOUNTER — Ambulatory Visit
Admission: RE | Admit: 2023-03-04 | Discharge: 2023-03-04 | Disposition: A | Discharge status: Home / Self Care | Payer: PPO | Source: Ambulatory Visit | Attending: Radiation Oncology | Admitting: Radiation Oncology

## 2023-03-04 ENCOUNTER — Other Ambulatory Visit: Payer: Self-pay

## 2023-03-04 DIAGNOSIS — Z191 Hormone sensitive malignancy status: Secondary | ICD-10-CM | POA: Diagnosis not present

## 2023-03-04 DIAGNOSIS — C61 Malignant neoplasm of prostate: Secondary | ICD-10-CM | POA: Diagnosis not present

## 2023-03-04 DIAGNOSIS — Z510 Encounter for antineoplastic radiation therapy: Secondary | ICD-10-CM | POA: Diagnosis not present

## 2023-03-04 LAB — RAD ONC ARIA SESSION SUMMARY
Course Elapsed Days: 33
Plan Fractions Treated to Date: 24
Plan Prescribed Dose Per Fraction: 2.5 Gy
Plan Total Fractions Prescribed: 28
Plan Total Prescribed Dose: 70 Gy
Reference Point Dosage Given to Date: 60 Gy
Reference Point Session Dosage Given: 2.5 Gy
Session Number: 24

## 2023-03-05 ENCOUNTER — Ambulatory Visit
Admission: RE | Admit: 2023-03-05 | Discharge: 2023-03-05 | Disposition: A | Discharge status: Home / Self Care | Payer: PPO | Source: Ambulatory Visit | Attending: Radiation Oncology | Admitting: Radiation Oncology

## 2023-03-05 ENCOUNTER — Other Ambulatory Visit: Payer: Self-pay

## 2023-03-05 ENCOUNTER — Ambulatory Visit: Payer: PPO | Admitting: Adult Health

## 2023-03-05 DIAGNOSIS — C61 Malignant neoplasm of prostate: Secondary | ICD-10-CM | POA: Diagnosis not present

## 2023-03-05 DIAGNOSIS — Z191 Hormone sensitive malignancy status: Secondary | ICD-10-CM | POA: Diagnosis not present

## 2023-03-05 DIAGNOSIS — Z510 Encounter for antineoplastic radiation therapy: Secondary | ICD-10-CM | POA: Diagnosis not present

## 2023-03-05 LAB — RAD ONC ARIA SESSION SUMMARY
Course Elapsed Days: 34
Plan Fractions Treated to Date: 25
Plan Prescribed Dose Per Fraction: 2.5 Gy
Plan Total Fractions Prescribed: 28
Plan Total Prescribed Dose: 70 Gy
Reference Point Dosage Given to Date: 62.5 Gy
Reference Point Session Dosage Given: 2.5 Gy
Session Number: 25

## 2023-03-06 ENCOUNTER — Other Ambulatory Visit: Payer: Self-pay

## 2023-03-06 ENCOUNTER — Ambulatory Visit
Admission: RE | Admit: 2023-03-06 | Discharge: 2023-03-06 | Disposition: A | Discharge status: Home / Self Care | Payer: PPO | Source: Ambulatory Visit | Attending: Radiation Oncology | Admitting: Radiation Oncology

## 2023-03-06 DIAGNOSIS — Z191 Hormone sensitive malignancy status: Secondary | ICD-10-CM | POA: Diagnosis not present

## 2023-03-06 DIAGNOSIS — Z510 Encounter for antineoplastic radiation therapy: Secondary | ICD-10-CM | POA: Diagnosis not present

## 2023-03-06 DIAGNOSIS — C61 Malignant neoplasm of prostate: Secondary | ICD-10-CM | POA: Diagnosis not present

## 2023-03-06 LAB — RAD ONC ARIA SESSION SUMMARY
Course Elapsed Days: 35
Plan Fractions Treated to Date: 26
Plan Prescribed Dose Per Fraction: 2.5 Gy
Plan Total Fractions Prescribed: 28
Plan Total Prescribed Dose: 70 Gy
Reference Point Dosage Given to Date: 65 Gy
Reference Point Session Dosage Given: 2.5 Gy
Session Number: 26

## 2023-03-07 ENCOUNTER — Other Ambulatory Visit: Payer: Self-pay

## 2023-03-07 ENCOUNTER — Ambulatory Visit
Admission: RE | Admit: 2023-03-07 | Discharge: 2023-03-07 | Disposition: A | Discharge status: Home / Self Care | Payer: PPO | Source: Ambulatory Visit | Attending: Radiation Oncology | Admitting: Radiation Oncology

## 2023-03-07 DIAGNOSIS — Z510 Encounter for antineoplastic radiation therapy: Secondary | ICD-10-CM | POA: Diagnosis not present

## 2023-03-07 DIAGNOSIS — Z191 Hormone sensitive malignancy status: Secondary | ICD-10-CM | POA: Diagnosis not present

## 2023-03-07 DIAGNOSIS — C61 Malignant neoplasm of prostate: Secondary | ICD-10-CM | POA: Diagnosis not present

## 2023-03-07 LAB — RAD ONC ARIA SESSION SUMMARY
Course Elapsed Days: 36
Plan Fractions Treated to Date: 27
Plan Prescribed Dose Per Fraction: 2.5 Gy
Plan Total Fractions Prescribed: 28
Plan Total Prescribed Dose: 70 Gy
Reference Point Dosage Given to Date: 67.5 Gy
Reference Point Session Dosage Given: 2.5 Gy
Session Number: 27

## 2023-03-08 ENCOUNTER — Ambulatory Visit
Admission: RE | Admit: 2023-03-08 | Discharge: 2023-03-08 | Disposition: A | Discharge status: Home / Self Care | Payer: PPO | Source: Ambulatory Visit | Attending: Radiation Oncology | Admitting: Radiation Oncology

## 2023-03-08 ENCOUNTER — Other Ambulatory Visit: Payer: Self-pay

## 2023-03-08 DIAGNOSIS — C61 Malignant neoplasm of prostate: Secondary | ICD-10-CM

## 2023-03-08 DIAGNOSIS — Z191 Hormone sensitive malignancy status: Secondary | ICD-10-CM | POA: Diagnosis not present

## 2023-03-08 DIAGNOSIS — Z510 Encounter for antineoplastic radiation therapy: Secondary | ICD-10-CM | POA: Diagnosis not present

## 2023-03-08 LAB — RAD ONC ARIA SESSION SUMMARY
Course Elapsed Days: 37
Plan Fractions Treated to Date: 28
Plan Prescribed Dose Per Fraction: 2.5 Gy
Plan Total Fractions Prescribed: 28
Plan Total Prescribed Dose: 70 Gy
Reference Point Dosage Given to Date: 70 Gy
Reference Point Session Dosage Given: 2.5 Gy
Session Number: 28

## 2023-03-11 NOTE — Radiation Completion Notes (Addendum)
  Radiation Oncology         [phone] ________________________________  Name: Andrew Larsen MRN: [id number]  Date: 03/08/2023  DOB: Jun 09, 1940  Referring Physician: GARNETTE SHACK, M.D. Date of Service: 2023-03-11 Radiation Oncologist: Adina Barge, M.D. [facility]     RADIATION ONCOLOGY END OF TREATMENT NOTE     Diagnosis:  82 y.o. gentleman with Stage T1c adenocarcinoma of the prostate with Gleason score of 4+3, and PSA of 5.59.   Intent: Curative     ==========DELIVERED PLANS==========  First Treatment Date: 2023-01-30 - Last Treatment Date: 2023-03-08   Plan Name: Prostate Site: Prostate Technique: IMRT Mode: Photon Dose Per Fraction: 2.5 Gy Prescribed Dose (Delivered / Prescribed): 70 Gy / 70 Gy Prescribed Fxs (Delivered / Prescribed): 28 / 28     ==========ON TREATMENT VISIT DATES========== 2023-02-01, 2023-02-08, 2023-02-14, 2023-02-22, 2023-03-01, 2023-03-08    See weekly On Treatment Notes in Epic for details.  He tolerated the daily radiation treatments relatively well with some increased LUTS and modest fatigue.   The patient will receive a call in about one month from the radiation oncology department. He will continue follow up with Dr. SHACK as well.  ------------------------------------------------   Donnice Barge, MD [name]  Radiation Oncology Direct Dial: [phone]  Fax: [phone] [email or web address]  Skype  LinkedIn

## 2023-03-11 NOTE — Progress Notes (Signed)
RN left message for call back to review post treatment education.

## 2023-03-12 DIAGNOSIS — E785 Hyperlipidemia, unspecified: Secondary | ICD-10-CM | POA: Diagnosis not present

## 2023-03-12 DIAGNOSIS — E1169 Type 2 diabetes mellitus with other specified complication: Secondary | ICD-10-CM | POA: Diagnosis not present

## 2023-03-12 DIAGNOSIS — N1832 Chronic kidney disease, stage 3b: Secondary | ICD-10-CM | POA: Diagnosis not present

## 2023-03-12 DIAGNOSIS — I5032 Chronic diastolic (congestive) heart failure: Secondary | ICD-10-CM | POA: Diagnosis not present

## 2023-03-12 DIAGNOSIS — I130 Hypertensive heart and chronic kidney disease with heart failure and stage 1 through stage 4 chronic kidney disease, or unspecified chronic kidney disease: Secondary | ICD-10-CM | POA: Diagnosis not present

## 2023-03-13 DIAGNOSIS — I10 Essential (primary) hypertension: Secondary | ICD-10-CM | POA: Diagnosis not present

## 2023-03-13 DIAGNOSIS — E7800 Pure hypercholesterolemia, unspecified: Secondary | ICD-10-CM | POA: Diagnosis not present

## 2023-03-13 DIAGNOSIS — G729 Myopathy, unspecified: Secondary | ICD-10-CM | POA: Diagnosis not present

## 2023-03-13 DIAGNOSIS — N189 Chronic kidney disease, unspecified: Secondary | ICD-10-CM | POA: Diagnosis not present

## 2023-03-13 DIAGNOSIS — I739 Peripheral vascular disease, unspecified: Secondary | ICD-10-CM | POA: Diagnosis not present

## 2023-03-13 DIAGNOSIS — E1165 Type 2 diabetes mellitus with hyperglycemia: Secondary | ICD-10-CM | POA: Diagnosis not present

## 2023-03-18 DIAGNOSIS — C61 Malignant neoplasm of prostate: Secondary | ICD-10-CM | POA: Diagnosis not present

## 2023-03-18 DIAGNOSIS — N1832 Chronic kidney disease, stage 3b: Secondary | ICD-10-CM | POA: Diagnosis not present

## 2023-03-18 DIAGNOSIS — I5032 Chronic diastolic (congestive) heart failure: Secondary | ICD-10-CM | POA: Diagnosis not present

## 2023-03-18 DIAGNOSIS — E1169 Type 2 diabetes mellitus with other specified complication: Secondary | ICD-10-CM | POA: Diagnosis not present

## 2023-03-18 DIAGNOSIS — E785 Hyperlipidemia, unspecified: Secondary | ICD-10-CM | POA: Diagnosis not present

## 2023-03-18 DIAGNOSIS — I130 Hypertensive heart and chronic kidney disease with heart failure and stage 1 through stage 4 chronic kidney disease, or unspecified chronic kidney disease: Secondary | ICD-10-CM | POA: Diagnosis not present

## 2023-03-18 NOTE — Progress Notes (Signed)
Patient was a RadOnc Consult on 10/31/22 for his stage T1 adenocarcinoma of the prostate with Gleason score of 4+3, and PSA of 5.59.   Patient proceed with treatment recommendations of 5.5 weeks of daily radiation and had his final radiation treatment on 03/08/23.   Patient is scheduled for a post treatment nurse call on 04/09/23 and will follow up with urology on 06/05/22 at 1pm with Dr. Marlou Porch.   RN left message for call back to review follow up appointment and PSA post treatment monitoring.

## 2023-03-19 ENCOUNTER — Other Ambulatory Visit: Payer: Self-pay | Admitting: Cardiology

## 2023-03-19 DIAGNOSIS — I130 Hypertensive heart and chronic kidney disease with heart failure and stage 1 through stage 4 chronic kidney disease, or unspecified chronic kidney disease: Secondary | ICD-10-CM | POA: Diagnosis not present

## 2023-03-19 DIAGNOSIS — Z6836 Body mass index (BMI) 36.0-36.9, adult: Secondary | ICD-10-CM | POA: Diagnosis not present

## 2023-03-19 DIAGNOSIS — D692 Other nonthrombocytopenic purpura: Secondary | ICD-10-CM | POA: Diagnosis not present

## 2023-03-19 DIAGNOSIS — E1169 Type 2 diabetes mellitus with other specified complication: Secondary | ICD-10-CM | POA: Diagnosis not present

## 2023-03-19 DIAGNOSIS — I700 Atherosclerosis of aorta: Secondary | ICD-10-CM | POA: Diagnosis not present

## 2023-03-19 DIAGNOSIS — E7800 Pure hypercholesterolemia, unspecified: Secondary | ICD-10-CM

## 2023-03-19 DIAGNOSIS — Z794 Long term (current) use of insulin: Secondary | ICD-10-CM | POA: Diagnosis not present

## 2023-03-19 DIAGNOSIS — G4700 Insomnia, unspecified: Secondary | ICD-10-CM | POA: Diagnosis not present

## 2023-03-19 DIAGNOSIS — I5032 Chronic diastolic (congestive) heart failure: Secondary | ICD-10-CM | POA: Diagnosis not present

## 2023-03-19 DIAGNOSIS — N1832 Chronic kidney disease, stage 3b: Secondary | ICD-10-CM | POA: Diagnosis not present

## 2023-03-19 DIAGNOSIS — Z23 Encounter for immunization: Secondary | ICD-10-CM | POA: Diagnosis not present

## 2023-03-19 DIAGNOSIS — G720 Drug-induced myopathy: Secondary | ICD-10-CM | POA: Diagnosis not present

## 2023-03-25 DIAGNOSIS — H401131 Primary open-angle glaucoma, bilateral, mild stage: Secondary | ICD-10-CM | POA: Diagnosis not present

## 2023-03-25 DIAGNOSIS — H2511 Age-related nuclear cataract, right eye: Secondary | ICD-10-CM | POA: Diagnosis not present

## 2023-03-29 NOTE — Progress Notes (Signed)
RN spoke with patient and confirmed appointment with Dr. Marlou Porch at [facility].  Education provided on PSA monitoring post treatment.  All questions answered, no additional needs at this time.

## 2023-04-09 ENCOUNTER — Ambulatory Visit
Admission: RE | Admit: 2023-04-09 | Discharge: 2023-04-09 | Disposition: A | Discharge status: Home / Self Care | Payer: PPO | Source: Ambulatory Visit | Attending: Radiation Oncology | Admitting: Radiation Oncology

## 2023-04-09 NOTE — Progress Notes (Signed)
  Radiation Oncology         [phone] ________________________________  Name: Andrew Larsen MRN: [id number]  Date of Service: 04/09/2023  DOB: 06/21/1940  Post Treatment Telephone Note  Diagnosis:  C61 Malignant neoplasm of prostate (as documented in provider EOT note)  Pre Treatment IPSS Score: 19 (as documented in the provider consult note)  The patient was available for call today.   Symptoms of fatigue have improved since completing therapy.  Symptoms of bladder changes have improved since completing therapy. Current symptoms include none, and medications for bladder symptoms include none.  Symptoms of bowel changes have improved since completing therapy. Current symptoms include none, and medications for bowel symptoms include none.   Post Treatment IPSS Score: 12 IPSS Questionnaire (AUA-7): Over the past month.   1)  How often have you had a sensation of not emptying your bladder completely after you finish urinating?  1 - Less than 1 time in 5  2)  How often have you had to urinate again less than two hours after you finished urinating? 4 - More than half the time  3)  How often have you found you stopped and started again several times when you urinated?  1 - Less than 1 time in 5  4) How difficult have you found it to postpone urination?  1 - Less than 1 time in 5  5) How often have you had a weak urinary stream?  2 - Less than half the time  6) How often have you had to push or strain to begin urination?  0 - Not at all  7) How many times did you most typically get up to urinate from the time you went to bed until the time you got up in the morning?  3 - 3 times  Total score:  12. Which indicates moderate symptoms  0-7 mildly symptomatic   8-19 moderately symptomatic   20-35 severely symptomatic   Patient has a scheduled follow up visit with his urologist, Dr. Marlou Porch, on 06/06/2023 for ongoing surveillance. He was counseled that PSA levels will be drawn in the  urology office, and was reassured that additional time is expected to improve bowel and bladder symptoms. He was encouraged to call back with concerns or questions regarding radiation.  This concludes the interaction.  Ruel Favors, LPN

## 2023-04-17 DIAGNOSIS — G7241 Inclusion body myositis [IBM]: Secondary | ICD-10-CM | POA: Diagnosis not present

## 2023-04-17 DIAGNOSIS — E1151 Type 2 diabetes mellitus with diabetic peripheral angiopathy without gangrene: Secondary | ICD-10-CM | POA: Diagnosis not present

## 2023-04-17 DIAGNOSIS — E1136 Type 2 diabetes mellitus with diabetic cataract: Secondary | ICD-10-CM | POA: Diagnosis not present

## 2023-04-17 DIAGNOSIS — I130 Hypertensive heart and chronic kidney disease with heart failure and stage 1 through stage 4 chronic kidney disease, or unspecified chronic kidney disease: Secondary | ICD-10-CM | POA: Diagnosis not present

## 2023-04-17 DIAGNOSIS — N1832 Chronic kidney disease, stage 3b: Secondary | ICD-10-CM | POA: Diagnosis not present

## 2023-04-17 DIAGNOSIS — F330 Major depressive disorder, recurrent, mild: Secondary | ICD-10-CM | POA: Diagnosis not present

## 2023-04-17 DIAGNOSIS — E1142 Type 2 diabetes mellitus with diabetic polyneuropathy: Secondary | ICD-10-CM | POA: Diagnosis not present

## 2023-04-17 DIAGNOSIS — E1139 Type 2 diabetes mellitus with other diabetic ophthalmic complication: Secondary | ICD-10-CM | POA: Diagnosis not present

## 2023-04-17 DIAGNOSIS — E1122 Type 2 diabetes mellitus with diabetic chronic kidney disease: Secondary | ICD-10-CM | POA: Diagnosis not present

## 2023-04-17 DIAGNOSIS — E1165 Type 2 diabetes mellitus with hyperglycemia: Secondary | ICD-10-CM | POA: Diagnosis not present

## 2023-04-17 DIAGNOSIS — E261 Secondary hyperaldosteronism: Secondary | ICD-10-CM | POA: Diagnosis not present

## 2023-05-09 DIAGNOSIS — G4733 Obstructive sleep apnea (adult) (pediatric): Secondary | ICD-10-CM | POA: Diagnosis not present

## 2023-06-06 ENCOUNTER — Emergency Department (HOSPITAL_COMMUNITY)
Admission: EM | Admit: 2023-06-06 | Discharge: 2023-06-06 | Disposition: A | Discharge status: Home / Self Care | Payer: PPO | Attending: Emergency Medicine | Admitting: Emergency Medicine

## 2023-06-06 ENCOUNTER — Emergency Department (HOSPITAL_COMMUNITY): Payer: PPO

## 2023-06-06 ENCOUNTER — Encounter (HOSPITAL_COMMUNITY): Payer: Self-pay | Admitting: Emergency Medicine

## 2023-06-06 DIAGNOSIS — Z794 Long term (current) use of insulin: Secondary | ICD-10-CM | POA: Insufficient documentation

## 2023-06-06 DIAGNOSIS — W19XXXA Unspecified fall, initial encounter: Secondary | ICD-10-CM | POA: Diagnosis not present

## 2023-06-06 DIAGNOSIS — M25571 Pain in right ankle and joints of right foot: Secondary | ICD-10-CM | POA: Insufficient documentation

## 2023-06-06 DIAGNOSIS — S82401A Unspecified fracture of shaft of right fibula, initial encounter for closed fracture: Secondary | ICD-10-CM | POA: Diagnosis not present

## 2023-06-06 DIAGNOSIS — E119 Type 2 diabetes mellitus without complications: Secondary | ICD-10-CM | POA: Insufficient documentation

## 2023-06-06 DIAGNOSIS — M79671 Pain in right foot: Secondary | ICD-10-CM

## 2023-06-06 DIAGNOSIS — Z7982 Long term (current) use of aspirin: Secondary | ICD-10-CM | POA: Insufficient documentation

## 2023-06-06 NOTE — Discharge Instructions (Signed)
 Tylenol  1000 g every 6 hours for pain, ice applied to the ankle/foot.  Elevate your foot to help prevent swelling/decrease swelling.

## 2023-06-06 NOTE — ED Provider Notes (Signed)
 Andrew Larsen EMERGENCY DEPARTMENT AT [facility] Provider Note   CSN: [id number] Arrival date & time: 06/06/23  1322     History  Chief Complaint  Patient presents with   Andrew Larsen is a 83 y.o. male.   Fall   Patient is an 83 year old male with a past medical history significant for diabetes, sleep apnea, inclusion body myositis  He states he has issues ambulating at baseline and today his right knee gave out after more walking than he is used to and he fell to the ground he complains of some right ankle pain and right foot pain but states that his foot is primarily what is bothering him.  No loss of consciousness, no head injury, no fevers no chills.  He is overall feeling well but wanted to make sure he did not have any fractures.       Home Medications Prior to Admission medications   Medication Sig Start Date End Date Taking? Authorizing Provider  aspirin  81 MG chewable tablet Chew 81 mg by mouth at bedtime.     [provider]  Continuous Blood Gluc Sensor (FREESTYLE LIBRE 2 SENSOR) MISC Inject 1 Device into the skin every 14 (fourteen) days. 08/22/21   [provider]  Continuous Glucose Receiver (FREESTYLE LIBRE 14 DAY READER) DEVI FreeStyle Libre 14 Day Reader    [provider]  Continuous Glucose Sensor (FREESTYLE LIBRE 14 DAY SENSOR) MISC FreeStyle Libre 14 Day Sensor    [provider]  dorzolamide  (TRUSOPT ) 2 % ophthalmic solution Place 1 drop into the right eye 2 (two) times daily.    [provider]  ezetimibe  (ZETIA ) 10 MG tablet TAKE 1 TABLET(10 MG) BY MOUTH DAILY AFTER SUPPER 03/19/23   Ladona Heinz, MD  furosemide  (LASIX ) 40 MG tablet Take 1 tablet (40 mg total) by mouth daily. Patient taking differently: Take 40 mg by mouth 2 (two) times daily. 12/15/18   Angiulli, Toribio PARAS, PA-C  gabapentin  (NEURONTIN ) 300 MG capsule Take 2 capsules (600 mg total) by mouth 3 (three) times daily. Patient taking  differently: Take 900 mg by mouth 3 (three) times daily. 12/15/18   Angiulli, Toribio PARAS, PA-C  insulin  aspart protamine  - aspart (NOVOLOG  MIX 70/30 FLEXPEN) (70-30) 100 UNIT/ML FlexPen Inject 0.36 mLs (36 Units total) into the skin daily with breakfast. Patient taking differently: Inject 40 Units into the skin in the morning and at bedtime. 12/15/18   Angiulli, Toribio PARAS, PA-C  JARDIANCE 10 MG TABS tablet Take 10 mg by mouth daily.    [provider]  latanoprost  (XALATAN ) 0.005 % ophthalmic solution Place 1 drop into both eyes at bedtime.    [provider]  losartan  (COZAAR ) 25 MG tablet TAKE 1 TABLET(25 MG) BY MOUTH DAILY Patient taking differently: Take 25 mg by mouth daily. 10/05/19   Ladona Heinz, MD  MAGNESIUM  PO Take by mouth.    [provider]  metoprolol  tartrate (LOPRESSOR ) 25 MG tablet Take 1 tablet (25 mg total) by mouth 2 (two) times daily. 12/15/18   Angiulli, Daniel J, PA-C  Multiple Vitamin (MULTIVITAMIN WITH MINERALS) TABS tablet Take 1 tablet by mouth daily.    [provider]  pantoprazole  (PROTONIX ) 40 MG tablet TAKE 1 TABLET(40 MG) BY MOUTH DAILY BEFORE BREAKFAST AS NEEDED FOR COUGH OR ACID REFLUX 05/04/22   Ladona Heinz, MD  Potassium 99 MG TABS Take by mouth.    [provider]  potassium chloride  SA (KLOR-CON  M20)  20 MEQ tablet 1 tablet with food Orally Once a day    [provider]  PRESCRIPTION MEDICATION CPAP- At bedtime    [provider]  solifenacin (VESICARE) 5 MG tablet Take 5 mg by mouth daily. 08/22/22   [provider]  tamsulosin  (FLOMAX ) 0.4 MG CAPS capsule Take 0.4 mg by mouth daily.    [provider]  timolol  (TIMOPTIC ) 0.5 % ophthalmic solution Place 1 drop into the right eye 2 (two) times daily. 01/30/19   [provider]  triamterene -hydrochlorothiazide  (MAXZIDE -25) 37.5-25 MG tablet Take 1 tablet by mouth daily. 11/16/14   [provider]      Allergies    Metformin  and related, Pollen extract, and Simbrinza [brinzolamide-brimonidine ]    Review of Systems   Review of Systems  Physical Exam Updated Vital Signs There were no vitals taken for this visit. Physical Exam Vitals and nursing note reviewed.  Constitutional:      General: He is not in acute distress.    Appearance: Normal appearance. He is not ill-appearing.  HENT:     Head: Normocephalic and atraumatic.     Nose: Nose normal.     Mouth/Throat:     Mouth: Mucous membranes are moist.  Eyes:     General: No scleral icterus.       Right eye: No discharge.        Left eye: No discharge.     Conjunctiva/sclera: Conjunctivae normal.  Pulmonary:     Effort: Pulmonary effort is normal.     Breath sounds: No stridor.  Musculoskeletal:     Comments: Some tenderness of the right foot over approximately the navicular bone.  No bony deformities, some mild swelling in the ankle joint.  No focal bony tenderness otherwise.  Full range of motion of toes, sensation symmetric.  Skin:    General: Skin is warm and dry.  Neurological:     Mental Status: He is alert and oriented to person, place, and time. Mental status is at baseline.     ED Results / Procedures / Treatments   Labs (all labs ordered are listed, but only abnormal results are displayed) Labs Reviewed - No data to display  EKG None  Radiology DG Ankle Complete Right Result Date: 06/06/2023 CLINICAL DATA:  Fall. Pain. History of right ankle surgery in 2021. EXAM: RIGHT ANKLE - COMPLETE 3+ VIEW; RIGHT FOOT COMPLETE - 3+ VIEW COMPARISON:  Right ankle radiographs 10/03/2012, 10/02/2012 FINDINGS: There is diffuse decreased bone mineralization. Interval removal of overlying casting material. Right ankle: There are two screws traversing the prior oblique fracture of the superior aspect of the medial malleolus. Interval complete fusion of the prior fracture. Redemonstration of lateral plate and screw fixation of the distal fibular  metadiaphyseal fracture, now completely healed. Minimal tibiotalar joint space narrowing. The ankle mortise is symmetric and intact. Tiny plantar calcaneal heel spur. No acute fracture or dislocation. Right foot: Mild-to-moderate interphalangeal joint space narrowing diffusely. Mild tarsometatarsal joint subchondral sclerosis diffusely without significant joint space narrowing. No acute fracture is seen. No dislocation. Moderate atherosclerotic calcifications. IMPRESSION: 1. No acute fracture of the right ankle or foot. 2. Interval complete fusion of the prior medial malleolus and distal fibular metadiaphyseal fractures, status post ORIF. Electronically Signed   By: Tanda Lyons M.D.   On: 06/06/2023 14:37   DG Foot Complete Right Result Date: 06/06/2023 CLINICAL DATA:  Fall. Pain. History of right ankle surgery in 2021. EXAM: RIGHT ANKLE - COMPLETE 3+ VIEW;  RIGHT FOOT COMPLETE - 3+ VIEW COMPARISON:  Right ankle radiographs 10/03/2012, 10/02/2012 FINDINGS: There is diffuse decreased bone mineralization. Interval removal of overlying casting material. Right ankle: There are two screws traversing the prior oblique fracture of the superior aspect of the medial malleolus. Interval complete fusion of the prior fracture. Redemonstration of lateral plate and screw fixation of the distal fibular metadiaphyseal fracture, now completely healed. Minimal tibiotalar joint space narrowing. The ankle mortise is symmetric and intact. Tiny plantar calcaneal heel spur. No acute fracture or dislocation. Right foot: Mild-to-moderate interphalangeal joint space narrowing diffusely. Mild tarsometatarsal joint subchondral sclerosis diffusely without significant joint space narrowing. No acute fracture is seen. No dislocation. Moderate atherosclerotic calcifications. IMPRESSION: 1. No acute fracture of the right ankle or foot. 2. Interval complete fusion of the prior medial malleolus and distal fibular metadiaphyseal fractures, status  post ORIF. Electronically Signed   By: Tanda Lyons M.D.   On: 06/06/2023 14:37    Procedures Procedures    Medications Ordered in ED Medications - No data to display  ED Course/ Medical Decision Making/ A&P                                 Medical Decision Making Amount and/or Complexity of Data Reviewed Radiology: ordered.   Patient is an 83 year old male with a past medical history significant for diabetes, sleep apnea, inclusion body myositis  He states he has issues ambulating at baseline and today his right knee gave out after more walking than he is used to and he fell to the ground he complains of some right ankle pain and right foot pain but states that his foot is primarily what is bothering him.  No loss of consciousness, no head injury, no fevers no chills.  He is overall feeling well but wanted to make sure he did not have any fractures.   Physical exam unremarkable apart from swelling and foot tenderness and some swelling around the ankle but no tenderness of the ankle.  Range of motion of lower extremity and sensation is intact cap refill normal.  Will place an Ace wrap and recommend ice elevation Tylenol .  Return precautions discussed.  I personally viewed images from this x-rays.  Old hardware appears intact and no acute fractures.  Placed in Ace wrap, he will be transported home by wife who is at bedside.  Will follow-up with orthopedics.  Return precautions discussed  Final Clinical Impression(s) / ED Diagnoses Final diagnoses:  Fall, initial encounter  Acute right ankle pain  Right foot pain    Rx / DC Orders ED Discharge Orders     None         Neldon Hamp RAMAN, [place] 06/06/23 2123    Jerral Meth, MD 06/06/23 2131

## 2023-06-06 NOTE — ED Triage Notes (Signed)
 Pt here from MD office with c/o right ankle pain after a fall when his leg gave out , no loc did not hit his head

## 2023-06-25 DIAGNOSIS — N189 Chronic kidney disease, unspecified: Secondary | ICD-10-CM | POA: Diagnosis not present

## 2023-06-25 DIAGNOSIS — E7800 Pure hypercholesterolemia, unspecified: Secondary | ICD-10-CM | POA: Diagnosis not present

## 2023-06-25 DIAGNOSIS — I10 Essential (primary) hypertension: Secondary | ICD-10-CM | POA: Diagnosis not present

## 2023-06-25 DIAGNOSIS — M792 Neuralgia and neuritis, unspecified: Secondary | ICD-10-CM | POA: Diagnosis not present

## 2023-06-25 DIAGNOSIS — E1165 Type 2 diabetes mellitus with hyperglycemia: Secondary | ICD-10-CM | POA: Diagnosis not present

## 2023-07-01 DIAGNOSIS — C61 Malignant neoplasm of prostate: Secondary | ICD-10-CM | POA: Diagnosis not present

## 2023-07-05 ENCOUNTER — Encounter: Payer: Self-pay | Admitting: Cardiology

## 2023-07-05 ENCOUNTER — Ambulatory Visit: Payer: PPO | Attending: Cardiology | Admitting: Cardiology

## 2023-07-05 VITALS — BP 122/78 | HR 78 | Ht 71.0 in | Wt 271.2 lb

## 2023-07-05 DIAGNOSIS — I739 Peripheral vascular disease, unspecified: Secondary | ICD-10-CM | POA: Diagnosis not present

## 2023-07-05 DIAGNOSIS — I10 Essential (primary) hypertension: Secondary | ICD-10-CM

## 2023-07-05 DIAGNOSIS — E7800 Pure hypercholesterolemia, unspecified: Secondary | ICD-10-CM | POA: Diagnosis not present

## 2023-07-05 MED ORDER — CLOPIDOGREL BISULFATE 75 MG PO TABS: 75.0000 mg | ORAL_TABLET | Freq: Every day | ORAL | 3 refills | Status: AC

## 2023-07-05 NOTE — Patient Instructions (Signed)
.  Medication Instructions:  Your physician recommends that you continue on your current medications as directed. Please refer to the Current Medication list given to you today. *If you need a refill on your cardiac medications before your next appointment, please call your pharmacy*   Testing/Procedures: ABI Vascular Ultrasound Your physician has requested that you have an ankle brachial index (ABI). During this test an ultrasound and blood pressure cuff are used to evaluate the arteries that supply the arms and legs with blood. Allow thirty minutes for this exam. There are no restrictions or special instructions.  Please note: We ask at that you not bring children with you during ultrasound (echo/ vascular) testing. Due to room size and safety concerns, children are not allowed in the ultrasound rooms during exams. Our front office staff cannot provide observation of children in our lobby area while testing is being conducted. An adult accompanying a patient to their appointment will only be allowed in the ultrasound room at the discretion of the ultrasound technician under special circumstances. We apologize for any inconvenience.    Follow-Up: At [facility], you and your health needs are our priority.  As part of our continuing mission to provide you with exceptional heart care, we have created designated Provider Care Teams.  These Care Teams include your primary Cardiologist (physician) and Advanced Practice Providers (APPs -  Physician Assistants and Nurse Practitioners) who all work together to provide you with the care you need, when you need it.  We recommend signing up for the patient portal called "MyChart".  Sign up information is provided on this After Visit Summary.  MyChart is used to connect with patients for Virtual Visits (Telemedicine).  Patients are able to view lab/test results, encounter notes, upcoming appointments, etc.  Non-urgent messages can be sent to your  provider as well.   To learn more about what you can do with MyChart, go to [email or web address].    Your next appointment:   1 year(s)  Provider:   Dr Jacinto Halim

## 2023-07-05 NOTE — Progress Notes (Signed)
 Cardiology Office Note:  .   Date:  07/07/2023  ID:  Andrew Larsen, DOB [birth year], MRN [id number] PCP: Andrew Penna, MD  [facility] Providers Cardiologist:  None   History of Present Illness: .   Andrew Larsen is a 83 y.o. African-American male with hypertension, hyperlipidemia, uncontrolled diabetes mellitus with peripheral arterial disease and history of angioplasty to left SFA in 2018, inclusion body myositis at [name] and has a positive NT5C1A antibody, morbid obesity.Marland Kitchen    He is not on a statin due to inclusion body myositis.  He is presently tolerating Zetia without any side effects.  Specifically denies symptoms of claudication although he has marked weakness in his lower extremity needing frequent use of wheelchair, has had a fall recently due to weakness in his legs.     Labs   External Labs:  Labs on Care Everywhere 06/26/2023:  Serum glucose 191 mg, BUN 21, creatinine 1.35, EGFR 53 mL, potassium 4.2, LFTs normal except for minimally elevated chronic alkaline phosphatase of 142.  A1c 8.1%.  [facility] labs 01/08/2023:  Hb 15.3, platelets 192.  Serum creatinine 1.4, EGFR 53 mL.  Labs 06/07/2022:  Total cholesterol 124, triglycerides 147, HDL 39, LDL 60.  Review of Systems  Cardiovascular:  Positive for leg swelling (ankle bilateral). Negative for chest pain and dyspnea on exertion.   Physical Exam:   VS:  BP 122/78 (BP Location: Left Arm)   Pulse 78   Ht 5\' 11"  (1.803 m)   Wt 271 lb 3.2 oz (123 kg)   SpO2 93%   BMI 37.82 kg/m    Wt Readings from Larsen 3 Encounters:  07/05/23 271 lb 3.2 oz (123 kg)  01/29/23 269 lb 3.2 oz (122.1 kg)  01/08/23 268 lb (121.6 kg)    Physical Exam Constitutional:      Appearance: He is obese.  Neck:     Vascular: No carotid bruit or JVD.  Cardiovascular:     Rate and Rhythm: Normal rate and regular rhythm.     Pulses: Intact distal pulses.          Dorsalis pedis pulses are 0 on the right side and 0 on the left  side.       Posterior tibial pulses are 0 on the right side and 0 on the left side.     Heart sounds: Normal heart sounds. No murmur heard.    No gallop.     Comments: Left leg colder Pulmonary:     Effort: Pulmonary effort is normal.     Breath sounds: Normal breath sounds.  Abdominal:     General: Bowel sounds are normal.     Palpations: Abdomen is soft.  Musculoskeletal:     Right lower leg: No edema.     Left lower leg: No edema.  Skin:    Capillary Refill: Capillary refill takes 2 to 3 seconds.    Studies Reviewed: Marland Kitchen     EKG:    EKG Interpretation Date/Time:  Friday July 05 2023 15:58:38 EST Ventricular Rate:  78 PR Interval:  226 QRS Duration:  74 QT Interval:  360 QTC Calculation: 410 R Axis:   33  Text Interpretation: EKG 07/05/2023: Sinus rhythm with first-degree block at the rate of 78 bpm, otherwise normal EKG.  Compared to 09/01/2021, sinus tachycardia with first-degree block at rate of 106 bpm not present. Confirmed by Delrae Rend [id number] on 07/05/2023 4:02:37 PM    EKG 08/24/2022: Sinus rhythm with first-degree AV block  at rate of 62 bpm, normal axis, incomplete right bundle branch block. No evidence of ischemia.   Medications and allergies    Allergies  Allergen Reactions   Metformin And Related Other (See Comments)    Reaction?   Pollen Extract Other (See Comments)   Simbrinza [Brinzolamide-Brimonidine] Other (See Comments)    Drainage, redness to eyes     Current Outpatient Medications:    clopidogrel (PLAVIX) 75 MG tablet, Take 1 tablet (75 mg total) by mouth daily., Disp: 90 tablet, Rfl: 3   Continuous Blood Gluc Sensor (FREESTYLE LIBRE 2 SENSOR) MISC, Inject 1 Device into the skin every 14 (fourteen) days., Disp: , Rfl:    dorzolamide (TRUSOPT) 2 % ophthalmic solution, Place 1 drop into the right eye 2 (two) times daily., Disp: , Rfl:    ezetimibe (ZETIA) 10 MG tablet, TAKE 1 TABLET(10 MG) BY MOUTH DAILY AFTER SUPPER, Disp: 90 tablet, Rfl: 1    furosemide (LASIX) 40 MG tablet, Take 1 tablet (40 mg total) by mouth daily. (Patient taking differently: Take 40 mg by mouth 2 (two) times daily.), Disp: 30 tablet, Rfl: 0   gabapentin (NEURONTIN) 300 MG capsule, Take 2 capsules (600 mg total) by mouth 3 (three) times daily. (Patient taking differently: Take 900 mg by mouth 3 (three) times daily.), Disp: 90 capsule, Rfl: 0   latanoprost (XALATAN) 0.005 % ophthalmic solution, Place 1 drop into both eyes at bedtime., Disp: , Rfl:    losartan (COZAAR) 25 MG tablet, TAKE 1 TABLET(25 MG) BY MOUTH DAILY (Patient taking differently: Take 25 mg by mouth daily.), Disp: 30 tablet, Rfl: 2   metoprolol tartrate (LOPRESSOR) 25 MG tablet, Take 1 tablet (25 mg total) by mouth 2 (two) times daily., Disp: 60 tablet, Rfl: 0   MOUNJARO 5 MG/0.5ML Pen, Inject 5 mg into the skin once a week., Disp: , Rfl:    Multiple Vitamin (MULTIVITAMIN WITH MINERALS) TABS tablet, Take 1 tablet by mouth daily., Disp: , Rfl:    NOVOLIN 70/30 (70-30) 100 UNIT/ML injection, Inject into the skin in the morning and at bedtime., Disp: , Rfl:    potassium chloride SA (KLOR-CON M) 20 MEQ tablet, Take 20 mEq by mouth daily., Disp: , Rfl:    PRESCRIPTION MEDICATION, CPAP- At bedtime, Disp: , Rfl:    rosuvastatin (CRESTOR) 10 MG tablet, Take 10 mg by mouth once a week., Disp: , Rfl:    solifenacin (VESICARE) 5 MG tablet, Take 5 mg by mouth daily., Disp: , Rfl:    tamsulosin (FLOMAX) 0.4 MG CAPS capsule, Take 0.4 mg by mouth daily., Disp: , Rfl:    timolol (TIMOPTIC) 0.5 % ophthalmic solution, Place 1 drop into the right eye 2 (two) times daily., Disp: , Rfl:    triamterene-hydrochlorothiazide (MAXZIDE-25) 37.5-25 MG tablet, Take 1 tablet by mouth daily., Disp: , Rfl:    zolpidem (AMBIEN) 5 MG tablet, Take 5 mg by mouth at bedtime as needed., Disp: , Rfl:    ASSESSMENT AND PLAN: .      ICD-10-CM   1. Claudication in peripheral vascular disease (HCC)  I73.9 EKG 12-Lead    clopidogrel (PLAVIX)  75 MG tablet    VAS [place] ABI WITH/WO TBI    2. Essential hypertension  I10 EKG 12-Lead    VAS [place] ABI WITH/WO TBI    3. Hypercholesteremia  E78.00 EKG 12-Lead    VAS [place] ABI WITH/WO TBI      1. Claudication in peripheral vascular disease [facility]) Although patient has no  symptoms clearly suggestive claudications extremely difficult to make her.  He clearly has absent pedal pulses on the left and left leg is colder than the right although there is no critical limb ischemia and capillary refill is <3 seconds on the left and <2 seconds on the right.  I will obtain ABI to reevaluate his baseline and if critical limb ischemia may need prophylactic peripheral arteriogram, suspect reocclusion of his left SFA.  Discussed with the patient and his daughter at the bedside regarding being extremely careful with trimming his nails, to avoid injury to his foot and to contact me immediately if he develops any ulceration.  2. Essential hypertension Blood pressure is well-controlled on Maxide, tamsulosin, metoprolol tartrate 25 mg twice daily and losartan 25 mg in the evening and  3. Hypercholesteremia I reviewed his external labs, lipids are well-controlled, he is on appropriate medical therapy.  I will see him back in a year or sooner if problems unless ABI is markedly abnormal then I may need to see him sooner and he may need peripheral arteriogram, patient and his daughter are aware of the risks and benefits of peripheral arteriogram including risk of renal insufficiency, bleeding complications and embolic complications in the lower extremity.   Signed,  Yates Decamp, MD, [facility] 07/07/2023, 5:09 PM [facility] [address] [place], [place] [address] Phone: [phone]. Fax:  [phone]

## 2023-07-08 ENCOUNTER — Other Ambulatory Visit: Payer: Self-pay | Admitting: Cardiology

## 2023-07-08 DIAGNOSIS — E7800 Pure hypercholesterolemia, unspecified: Secondary | ICD-10-CM

## 2023-07-08 DIAGNOSIS — I739 Peripheral vascular disease, unspecified: Secondary | ICD-10-CM

## 2023-07-15 ENCOUNTER — Encounter: Payer: Self-pay | Admitting: Cardiology

## 2023-07-15 ENCOUNTER — Ambulatory Visit (HOSPITAL_COMMUNITY)
Admission: RE | Admit: 2023-07-15 | Discharge: 2023-07-15 | Disposition: A | Discharge status: Home / Self Care | Source: Ambulatory Visit | Attending: Cardiology | Admitting: Cardiology

## 2023-07-15 DIAGNOSIS — I739 Peripheral vascular disease, unspecified: Secondary | ICD-10-CM | POA: Diagnosis not present

## 2023-07-15 LAB — VAS US ABI WITH/WO TBI
Left ABI: 0.71
Right ABI: 0.86

## 2023-07-15 NOTE — Progress Notes (Signed)
 Stable ABI indicating no change in blood supply at rest to the legs bilaterally compared to 06/24/19

## 2023-07-20 ENCOUNTER — Other Ambulatory Visit: Payer: Self-pay

## 2023-07-20 ENCOUNTER — Emergency Department (HOSPITAL_COMMUNITY)

## 2023-07-20 ENCOUNTER — Inpatient Hospital Stay (HOSPITAL_COMMUNITY)
Admission: EM | Admit: 2023-07-20 | Discharge: 2023-07-24 | DRG: 494 | Disposition: A | Discharge status: Skilled Nursing Facility | Source: Ambulatory Visit | Attending: Internal Medicine | Admitting: Internal Medicine

## 2023-07-20 ENCOUNTER — Encounter (HOSPITAL_COMMUNITY): Payer: Self-pay | Admitting: Emergency Medicine

## 2023-07-20 ENCOUNTER — Other Ambulatory Visit (HOSPITAL_COMMUNITY): Payer: Self-pay | Admitting: Surgical

## 2023-07-20 DIAGNOSIS — E7800 Pure hypercholesterolemia, unspecified: Secondary | ICD-10-CM | POA: Diagnosis present

## 2023-07-20 DIAGNOSIS — Z833 Family history of diabetes mellitus: Secondary | ICD-10-CM

## 2023-07-20 DIAGNOSIS — S82842A Displaced bimalleolar fracture of left lower leg, initial encounter for closed fracture: Secondary | ICD-10-CM | POA: Diagnosis not present

## 2023-07-20 DIAGNOSIS — H409 Unspecified glaucoma: Secondary | ICD-10-CM | POA: Diagnosis not present

## 2023-07-20 DIAGNOSIS — I129 Hypertensive chronic kidney disease with stage 1 through stage 4 chronic kidney disease, or unspecified chronic kidney disease: Secondary | ICD-10-CM | POA: Diagnosis present

## 2023-07-20 DIAGNOSIS — Z87891 Personal history of nicotine dependence: Secondary | ICD-10-CM | POA: Diagnosis not present

## 2023-07-20 DIAGNOSIS — S93432A Sprain of tibiofibular ligament of left ankle, initial encounter: Secondary | ICD-10-CM | POA: Diagnosis not present

## 2023-07-20 DIAGNOSIS — R2681 Unsteadiness on feet: Secondary | ICD-10-CM | POA: Diagnosis present

## 2023-07-20 DIAGNOSIS — E1140 Type 2 diabetes mellitus with diabetic neuropathy, unspecified: Secondary | ICD-10-CM | POA: Diagnosis not present

## 2023-07-20 DIAGNOSIS — Z9181 History of falling: Secondary | ICD-10-CM

## 2023-07-20 DIAGNOSIS — I7090 Unspecified atherosclerosis: Secondary | ICD-10-CM | POA: Diagnosis not present

## 2023-07-20 DIAGNOSIS — Y92009 Unspecified place in unspecified non-institutional (private) residence as the place of occurrence of the external cause: Secondary | ICD-10-CM | POA: Diagnosis not present

## 2023-07-20 DIAGNOSIS — R262 Difficulty in walking, not elsewhere classified: Secondary | ICD-10-CM | POA: Diagnosis not present

## 2023-07-20 DIAGNOSIS — R600 Localized edema: Secondary | ICD-10-CM | POA: Diagnosis not present

## 2023-07-20 DIAGNOSIS — S82852A Displaced trimalleolar fracture of left lower leg, initial encounter for closed fracture: Principal | ICD-10-CM | POA: Diagnosis present

## 2023-07-20 DIAGNOSIS — W010XXA Fall on same level from slipping, tripping and stumbling without subsequent striking against object, initial encounter: Secondary | ICD-10-CM | POA: Diagnosis present

## 2023-07-20 DIAGNOSIS — Z794 Long term (current) use of insulin: Secondary | ICD-10-CM

## 2023-07-20 DIAGNOSIS — Z6838 Body mass index (BMI) 38.0-38.9, adult: Secondary | ICD-10-CM

## 2023-07-20 DIAGNOSIS — I739 Peripheral vascular disease, unspecified: Secondary | ICD-10-CM | POA: Insufficient documentation

## 2023-07-20 DIAGNOSIS — Z8249 Family history of ischemic heart disease and other diseases of the circulatory system: Secondary | ICD-10-CM

## 2023-07-20 DIAGNOSIS — I130 Hypertensive heart and chronic kidney disease with heart failure and stage 1 through stage 4 chronic kidney disease, or unspecified chronic kidney disease: Secondary | ICD-10-CM | POA: Diagnosis not present

## 2023-07-20 DIAGNOSIS — E669 Obesity, unspecified: Secondary | ICD-10-CM | POA: Diagnosis present

## 2023-07-20 DIAGNOSIS — M25572 Pain in left ankle and joints of left foot: Secondary | ICD-10-CM | POA: Diagnosis present

## 2023-07-20 DIAGNOSIS — N400 Enlarged prostate without lower urinary tract symptoms: Secondary | ICD-10-CM | POA: Diagnosis not present

## 2023-07-20 DIAGNOSIS — S82852D Displaced trimalleolar fracture of left lower leg, subsequent encounter for closed fracture with routine healing: Secondary | ICD-10-CM | POA: Diagnosis not present

## 2023-07-20 DIAGNOSIS — M7989 Other specified soft tissue disorders: Secondary | ICD-10-CM | POA: Diagnosis not present

## 2023-07-20 DIAGNOSIS — G4733 Obstructive sleep apnea (adult) (pediatric): Secondary | ICD-10-CM | POA: Diagnosis not present

## 2023-07-20 DIAGNOSIS — E1151 Type 2 diabetes mellitus with diabetic peripheral angiopathy without gangrene: Secondary | ICD-10-CM | POA: Diagnosis not present

## 2023-07-20 DIAGNOSIS — Z9109 Other allergy status, other than to drugs and biological substances: Secondary | ICD-10-CM

## 2023-07-20 DIAGNOSIS — I890 Lymphedema, not elsewhere classified: Secondary | ICD-10-CM | POA: Diagnosis not present

## 2023-07-20 DIAGNOSIS — M1990 Unspecified osteoarthritis, unspecified site: Secondary | ICD-10-CM | POA: Diagnosis present

## 2023-07-20 DIAGNOSIS — G7241 Inclusion body myositis [IBM]: Secondary | ICD-10-CM | POA: Diagnosis not present

## 2023-07-20 DIAGNOSIS — W19XXXA Unspecified fall, initial encounter: Principal | ICD-10-CM

## 2023-07-20 DIAGNOSIS — E1122 Type 2 diabetes mellitus with diabetic chronic kidney disease: Secondary | ICD-10-CM | POA: Diagnosis present

## 2023-07-20 DIAGNOSIS — K5900 Constipation, unspecified: Secondary | ICD-10-CM | POA: Diagnosis not present

## 2023-07-20 DIAGNOSIS — C61 Malignant neoplasm of prostate: Secondary | ICD-10-CM | POA: Diagnosis present

## 2023-07-20 DIAGNOSIS — Z8042 Family history of malignant neoplasm of prostate: Secondary | ICD-10-CM | POA: Diagnosis not present

## 2023-07-20 DIAGNOSIS — M6281 Muscle weakness (generalized): Secondary | ICD-10-CM | POA: Diagnosis not present

## 2023-07-20 DIAGNOSIS — J45909 Unspecified asthma, uncomplicated: Secondary | ICD-10-CM | POA: Diagnosis present

## 2023-07-20 DIAGNOSIS — G4700 Insomnia, unspecified: Secondary | ICD-10-CM | POA: Diagnosis not present

## 2023-07-20 DIAGNOSIS — K219 Gastro-esophageal reflux disease without esophagitis: Secondary | ICD-10-CM | POA: Diagnosis present

## 2023-07-20 DIAGNOSIS — Z888 Allergy status to other drugs, medicaments and biological substances status: Secondary | ICD-10-CM

## 2023-07-20 DIAGNOSIS — I509 Heart failure, unspecified: Secondary | ICD-10-CM | POA: Diagnosis not present

## 2023-07-20 DIAGNOSIS — I5030 Unspecified diastolic (congestive) heart failure: Secondary | ICD-10-CM | POA: Diagnosis not present

## 2023-07-20 DIAGNOSIS — E785 Hyperlipidemia, unspecified: Secondary | ICD-10-CM | POA: Diagnosis not present

## 2023-07-20 DIAGNOSIS — N1832 Chronic kidney disease, stage 3b: Secondary | ICD-10-CM | POA: Diagnosis not present

## 2023-07-20 DIAGNOSIS — Y9301 Activity, walking, marching and hiking: Secondary | ICD-10-CM | POA: Diagnosis present

## 2023-07-20 DIAGNOSIS — Z79899 Other long term (current) drug therapy: Secondary | ICD-10-CM | POA: Diagnosis not present

## 2023-07-20 DIAGNOSIS — E1165 Type 2 diabetes mellitus with hyperglycemia: Secondary | ICD-10-CM | POA: Diagnosis present

## 2023-07-20 DIAGNOSIS — Z923 Personal history of irradiation: Secondary | ICD-10-CM

## 2023-07-20 DIAGNOSIS — S82892A Other fracture of left lower leg, initial encounter for closed fracture: Secondary | ICD-10-CM

## 2023-07-20 DIAGNOSIS — N1831 Chronic kidney disease, stage 3a: Secondary | ICD-10-CM | POA: Diagnosis not present

## 2023-07-20 DIAGNOSIS — Z8546 Personal history of malignant neoplasm of prostate: Secondary | ICD-10-CM

## 2023-07-20 DIAGNOSIS — R2689 Other abnormalities of gait and mobility: Secondary | ICD-10-CM | POA: Diagnosis not present

## 2023-07-20 DIAGNOSIS — Z9862 Peripheral vascular angioplasty status: Secondary | ICD-10-CM

## 2023-07-20 DIAGNOSIS — Z7902 Long term (current) use of antithrombotics/antiplatelets: Secondary | ICD-10-CM

## 2023-07-20 DIAGNOSIS — I10 Essential (primary) hypertension: Secondary | ICD-10-CM | POA: Diagnosis present

## 2023-07-20 DIAGNOSIS — Z7985 Long-term (current) use of injectable non-insulin antidiabetic drugs: Secondary | ICD-10-CM

## 2023-07-20 DIAGNOSIS — Z741 Need for assistance with personal care: Secondary | ICD-10-CM | POA: Diagnosis not present

## 2023-07-20 DIAGNOSIS — E119 Type 2 diabetes mellitus without complications: Secondary | ICD-10-CM

## 2023-07-20 DIAGNOSIS — G8918 Other acute postprocedural pain: Secondary | ICD-10-CM | POA: Diagnosis not present

## 2023-07-20 DIAGNOSIS — N189 Chronic kidney disease, unspecified: Secondary | ICD-10-CM | POA: Diagnosis not present

## 2023-07-20 LAB — GLUCOSE, CAPILLARY
Glucose-Capillary: 83 mg/dL (ref 70–99)
Glucose-Capillary: 74 mg/dL (ref 70–99)

## 2023-07-20 LAB — CBC
HCT: 42.4 % (ref 39.0–52.0)
Hemoglobin: 13.7 g/dL (ref 13.0–17.0)
MCH: 30.9 pg (ref 26.0–34.0)
MCHC: 32.3 g/dL (ref 30.0–36.0)
MCV: 95.7 fL (ref 80.0–100.0)
Platelets: 159 10*3/uL (ref 150–400)
RBC: 4.43 MIL/uL (ref 4.22–5.81)
RDW: 13.9 % (ref 11.5–15.5)
WBC: 7.2 10*3/uL (ref 4.0–10.5)
nRBC: 0 % (ref 0.0–0.2)

## 2023-07-20 LAB — BASIC METABOLIC PANEL
Anion gap: 7 (ref 5–15)
BUN: 21 mg/dL (ref 8–23)
CO2: 28 mmol/L (ref 22–32)
Calcium: 8.4 mg/dL — ABNORMAL LOW (ref 8.9–10.3)
Chloride: 100 mmol/L (ref 98–111)
Creatinine, Ser: 1.3 mg/dL — ABNORMAL HIGH (ref 0.61–1.24)
GFR, Estimated: 56 mL/min — ABNORMAL LOW (ref >60)
Glucose, Bld: 188 mg/dL — ABNORMAL HIGH (ref 70–99)
Potassium: 3.7 mmol/L (ref 3.5–5.1)
Sodium: 135 mmol/L (ref 135–145)

## 2023-07-20 LAB — TYPE AND SCREEN
ABO/RH(D): O POS
Antibody Screen: NEGATIVE

## 2023-07-20 MED ORDER — DORZOLAMIDE HCL 2 % OP SOLN
1.0000 [drp] | Freq: Two times a day (BID) | OPHTHALMIC | Status: DC
Start: 2023-07-20 — End: 2023-07-24
  Administered 2023-07-20 – 2023-07-24 (×8): 1 [drp] via OPHTHALMIC
  Filled 2023-07-20: qty 10

## 2023-07-20 MED ORDER — LOSARTAN POTASSIUM 25 MG PO TABS
25.0000 mg | ORAL_TABLET | Freq: Every day | ORAL | Status: DC
  Administered 2023-07-22 – 2023-07-24 (×3): 25 mg via ORAL
  Filled 2023-07-20 (×3): qty 1

## 2023-07-20 MED ORDER — ACETAMINOPHEN 325 MG PO TABS: 650.0000 mg | ORAL_TABLET | Freq: Four times a day (QID) | ORAL | Status: DC | PRN

## 2023-07-20 MED ORDER — METOPROLOL TARTRATE 25 MG PO TABS
25.0000 mg | ORAL_TABLET | Freq: Two times a day (BID) | ORAL | Status: DC
  Administered 2023-07-20 – 2023-07-24 (×7): 25 mg via ORAL
  Filled 2023-07-20 (×7): qty 1

## 2023-07-20 MED ORDER — INSULIN GLARGINE 100 UNIT/ML ~~LOC~~ SOLN
20.0000 [IU] | Freq: Every day | SUBCUTANEOUS | Status: DC
  Administered 2023-07-20 – 2023-07-23 (×4): 20 [IU] via SUBCUTANEOUS
  Filled 2023-07-20 (×5): qty 0.2

## 2023-07-20 MED ORDER — TIMOLOL MALEATE 0.5 % OP SOLN
1.0000 [drp] | Freq: Two times a day (BID) | OPHTHALMIC | Status: DC
Start: 2023-07-20 — End: 2023-07-24
  Administered 2023-07-20 – 2023-07-24 (×8): 1 [drp] via OPHTHALMIC
  Filled 2023-07-20: qty 5

## 2023-07-20 MED ORDER — GABAPENTIN 300 MG PO CAPS
600.0000 mg | ORAL_CAPSULE | Freq: Three times a day (TID) | ORAL | Status: DC
  Administered 2023-07-20 – 2023-07-24 (×10): 600 mg via ORAL
  Filled 2023-07-20: qty 6
  Filled 2023-07-20 (×9): qty 2

## 2023-07-20 MED ORDER — INSULIN GLARGINE 100 UNIT/ML ~~LOC~~ SOLN
30.0000 [IU] | Freq: Every day | SUBCUTANEOUS | Status: DC
  Filled 2023-07-20: qty 0.3

## 2023-07-20 MED ORDER — INSULIN GLARGINE 100 UNIT/ML ~~LOC~~ SOLN: 25.0000 [IU] | Freq: Every day | SUBCUTANEOUS | Status: DC

## 2023-07-20 MED ORDER — FUROSEMIDE 10 MG/ML IJ SOLN
40.0000 mg | Freq: Once | INTRAMUSCULAR | Status: AC
  Administered 2023-07-20: 40 mg via INTRAVENOUS
  Filled 2023-07-20: qty 4

## 2023-07-20 MED ORDER — SENNOSIDES-DOCUSATE SODIUM 8.6-50 MG PO TABS: 1.0000 | ORAL_TABLET | Freq: Every evening | ORAL | Status: DC | PRN

## 2023-07-20 MED ORDER — ACETAMINOPHEN 650 MG RE SUPP: 650.0000 mg | Freq: Four times a day (QID) | RECTAL | Status: DC | PRN

## 2023-07-20 MED ORDER — TAMSULOSIN HCL 0.4 MG PO CAPS
0.4000 mg | ORAL_CAPSULE | Freq: Every day | ORAL | Status: DC
  Administered 2023-07-22 – 2023-07-24 (×3): 0.4 mg via ORAL
  Filled 2023-07-20 (×3): qty 1

## 2023-07-20 MED ORDER — MORPHINE SULFATE (PF) 4 MG/ML IV SOLN
4.0000 mg | Freq: Once | INTRAVENOUS | Status: AC
  Administered 2023-07-20: 4 mg via INTRAVENOUS
  Filled 2023-07-20: qty 1

## 2023-07-20 MED ORDER — ONDANSETRON HCL 4 MG/2ML IJ SOLN
4.0000 mg | Freq: Once | INTRAMUSCULAR | Status: AC
  Administered 2023-07-20: 4 mg via INTRAVENOUS
  Filled 2023-07-20: qty 2

## 2023-07-20 MED ORDER — EZETIMIBE 10 MG PO TABS
10.0000 mg | ORAL_TABLET | Freq: Every day | ORAL | Status: DC
  Administered 2023-07-22 – 2023-07-24 (×3): 10 mg via ORAL
  Filled 2023-07-20 (×3): qty 1

## 2023-07-20 MED ORDER — HYDROMORPHONE HCL 1 MG/ML IJ SOLN
0.5000 mg | INTRAMUSCULAR | Status: DC | PRN
  Administered 2023-07-20 – 2023-07-21 (×2): 0.5 mg via INTRAVENOUS
  Filled 2023-07-20 (×2): qty 0.5

## 2023-07-20 MED ORDER — LATANOPROST 0.005 % OP SOLN
1.0000 [drp] | Freq: Every day | OPHTHALMIC | Status: DC
  Administered 2023-07-20 – 2023-07-23 (×4): 1 [drp] via OPHTHALMIC
  Filled 2023-07-20: qty 2.5

## 2023-07-20 MED ORDER — INSULIN ASPART 100 UNIT/ML IJ SOLN
0.0000 [IU] | Freq: Three times a day (TID) | INTRAMUSCULAR | Status: DC
  Administered 2023-07-21 – 2023-07-22 (×3): 3 [IU] via SUBCUTANEOUS
  Administered 2023-07-22: 5 [IU] via SUBCUTANEOUS
  Administered 2023-07-22: 3 [IU] via SUBCUTANEOUS
  Administered 2023-07-23: 2 [IU] via SUBCUTANEOUS
  Administered 2023-07-23: 3 [IU] via SUBCUTANEOUS
  Administered 2023-07-24 (×2): 2 [IU] via SUBCUTANEOUS

## 2023-07-20 MED ORDER — OXYCODONE HCL 5 MG PO TABS: 5.0000 mg | ORAL_TABLET | ORAL | Status: DC | PRN

## 2023-07-20 MED ORDER — ROSUVASTATIN CALCIUM 10 MG PO TABS
10.0000 mg | ORAL_TABLET | Freq: Every day | ORAL | Status: DC
  Administered 2023-07-22 – 2023-07-24 (×3): 10 mg via ORAL
  Filled 2023-07-20 (×3): qty 1

## 2023-07-20 NOTE — H&P (View-Only) (Signed)
 Patient is known to our office and presented today to [facility] with left ankle pain. X-rays taken showed a bimalleolar left ankle fracture.   He was recently seen by Dr. Nadara Eaton on 07/05/23 where he was found to have absent pedal pulses on the left and left leg colder than the right. ABI was obtained showing no change in blood supply compared to previous ABI 06/24/19.  Patient has multiple risk factors including HTN, HLD, uncontrolled DM, CHF on lasix, PAD s/p angioplasty to left SFA in 2018 on plavix, morbid obesity, inclusion body myositis.  Plan for admission to medicine service through the ED, possible ORIF left ankle tomorrow with Dr. Eulah Pont if medically optimized. If not, will plan for surgical intervention early next week once optimized.   Janine Ores, PA-C [facility]

## 2023-07-20 NOTE — H&P (Addendum)
 History and Physical    Patient: Andrew Larsen:[id number] DOB: 04/20/1941 DOA: 07/20/2023 DOS: the patient was seen and examined on 07/20/2023 PCP: Alysia Penna, MD  Patient coming from: Home  Chief Complaint:  Chief Complaint  Patient presents with   Fall   Ankle Pain   HPI: Andrew Larsen is a 83 y.o. male with medical history significant of inclusion body myositis, hypertension, CKD stage IIIA, peripheral arterial disease status post angioplasty to left SFA in 2018, type 2 diabetes, history of prostate cancer, obesity, asthma who presents to the ED with left ankle pain after sustaining a fall yesterday.  Patient reports that he was in his usual state of health until yesterday evening.  States he was using his walker to move around when his left leg gave out on him causing him to fall.  He immediately began having left ankle pain right afterwards.  States that he has experienced several recent falls due to leg weakness stemming from inclusion body myositis.  He uses a walker at home for ambulation at all times due to this.  He denies any lightheadedness, dizziness, chest pain, palpitations, shortness of breath, blurry vision prior to the fall.  Patient was recently evaluated by Dr. Jacinto Halim in the outpatient setting 2 weeks ago for his peripheral vascular disease.  Per evaluation, noted to have absent pedal pulses on the left and left leg being colder than right.  However evaluation did not reveal any critical limb ischemia and capillary refill was noted to be less than 3 seconds on the left.  He underwent an ABI during that visit which did not show any change in blood supply at rest to his legs bilaterally when compared to ABI in 2021.  He is on Plavix therapy and took his last dose this morning.  ED course: Vital signs stable.  CBC unremarkable.  BMP with hyperglycemia to 188, kidney function around baseline with creatinine 1.3.  Left ankle x-ray showing Weber B trimalleolar fracture  dislocation.  Left ankle CT showing stage IV Weber B fracture dislocation of the left ankle with oblique lateral malleolar component, transverse medial malleolar component, and small oblique posterior malleoli component with circumferential subcutaneous edema.  ED provider consulted orthopedic surgery who recommended medical admission and tentative plan for left ankle ORIF tomorrow morning.  [facility] hospitalist asked to evaluate patient for admission.  Review of Systems: As mentioned in the history of present illness. All other systems reviewed and are negative. Past Medical History:  Diagnosis Date   Arthritis    CKD (chronic kidney disease)    CKD stage III (01/2018)   Daytime somnolence    Diabetes mellitus without complication (HCC)    Fracture    right fibula/wears boot cast   GERD (gastroesophageal reflux disease)    Glaucoma    Hypercholesteremia    Hyperlipidemia    Hypertension    Mold exposure    7 yrs ago   PAD (peripheral artery disease) (HCC)    Prostate cancer (HCC)    Sleep apnea    uses cpap   Uses walker    Wears glasses    Past Surgical History:  Procedure Laterality Date   GOLD SEED IMPLANT N/A 01/08/2023   Procedure: GOLD SEED IMPLANT;  Surgeon: Despina Arias, MD;  Location: [facility];  Service: Urology;  Laterality: N/A;  30 MINUTES   LOWER EXTREMITY ANGIOGRAPHY N/A 06/12/2016   Procedure: Lower Extremity Angiography;  Surgeon: Yates Decamp, MD;  Location: [facility]  INVASIVE CV LAB;  Service: Cardiovascular;  Laterality: N/A;   MUSCLE BIOPSY Right 05/06/2018   Procedure: right vastus lateralis muscle biopsy;  Surgeon: Julio Sicks, MD;  Location: [facility] OR;  Service: Neurosurgery;  Laterality: Right;   ORIF ANKLE FRACTURE Right 10/02/2012   Procedure: OPEN REDUCTION INTERNAL FIXATION (ORIF) ANKLE FRACTURE;  Surgeon: Cammy Copa, MD;  Location: [facility] ORS;  Service: Orthopedics;  Laterality: Right;   PERIPHERAL VASCULAR ATHERECTOMY Left 06/12/2016    Procedure: Peripheral Vascular Atherectomy-Left Popliteal;  Surgeon: Yates Decamp, MD;  Location: [facility] INVASIVE CV LAB;  Service: Cardiovascular;  Laterality: Left;   PERIPHERAL VASCULAR INTERVENTION Left 06/12/2016   Procedure: Peripheral Vascular Intervention- DCB Left Popliteal;  Surgeon: Yates Decamp, MD;  Location: [facility] INVASIVE CV LAB;  Service: Cardiovascular;  Laterality: Left;   PROSTATE BIOPSY     SPACE OAR INSTILLATION N/A 01/08/2023   Procedure: SPACE OAR INSTILLATION;  Surgeon: Despina Arias, MD;  Location: [facility];  Service: Urology;  Laterality: N/A;   Social History:  reports that he quit smoking about 41 years ago. His smoking use included cigarettes. He started smoking about 61 years ago. He has a 20 pack-year smoking history. He has never used smokeless tobacco. He reports that he does not drink alcohol and does not use drugs.  Allergies  Allergen Reactions   Metformin And Related Other (See Comments)    Reaction?   Pollen Extract Other (See Comments)   Simbrinza [Brinzolamide-Brimonidine] Other (See Comments)    Drainage, redness to eyes    Family History  Problem Relation Age of Onset   Heart disease Father    Prostate cancer Brother    Cancer Brother    Diabetes Brother    Colon cancer Neg Hx    Sleep apnea Neg Hx     Prior to Admission medications   Medication Sig Start Date End Date Taking? Authorizing Provider  clopidogrel (PLAVIX) 75 MG tablet Take 1 tablet (75 mg total) by mouth daily. 07/05/23   Yates Decamp, MD  Continuous Blood Gluc Sensor (FREESTYLE LIBRE 2 SENSOR) MISC Inject 1 Device into the skin every 14 (fourteen) days. 08/22/21   [provider]  dorzolamide (TRUSOPT) 2 % ophthalmic solution Place 1 drop into the right eye 2 (two) times daily.    [provider]  ezetimibe (ZETIA) 10 MG tablet TAKE 1 TABLET(10 MG) BY MOUTH DAILY AFTER SUPPER 03/19/23   Yates Decamp, MD  furosemide (LASIX) 40 MG tablet Take 1 tablet (40 mg  total) by mouth daily. Patient taking differently: Take 40 mg by mouth 2 (two) times daily. 12/15/18   Angiulli, Mcarthur Rossetti, PA-C  gabapentin (NEURONTIN) 300 MG capsule Take 2 capsules (600 mg total) by mouth 3 (three) times daily. Patient taking differently: Take 900 mg by mouth 3 (three) times daily. 12/15/18   Angiulli, Mcarthur Rossetti, PA-C  latanoprost (XALATAN) 0.005 % ophthalmic solution Place 1 drop into both eyes at bedtime.    [provider]  losartan (COZAAR) 25 MG tablet TAKE 1 TABLET(25 MG) BY MOUTH DAILY Patient taking differently: Take 25 mg by mouth daily. 10/05/19   Yates Decamp, MD  metoprolol tartrate (LOPRESSOR) 25 MG tablet Take 1 tablet (25 mg total) by mouth 2 (two) times daily. 12/15/18   Angiulli, Mcarthur Rossetti, PA-C  MOUNJARO 5 MG/0.5ML Pen Inject 5 mg into the skin once a week. 06/26/23   [provider]  Multiple Vitamin (MULTIVITAMIN WITH MINERALS) TABS tablet Take 1 tablet by mouth  daily.    [provider]  NOVOLIN 70/30 (70-30) 100 UNIT/ML injection Inject into the skin in the morning and at bedtime. 07/03/23   [provider]  potassium chloride SA (KLOR-CON M) 20 MEQ tablet Take 20 mEq by mouth daily.    [provider]  PRESCRIPTION MEDICATION CPAP- At bedtime    [provider]  rosuvastatin (CRESTOR) 10 MG tablet Take 10 mg by mouth once a week. 05/26/23   [provider]  solifenacin (VESICARE) 5 MG tablet Take 5 mg by mouth daily. 08/22/22   [provider]  tamsulosin (FLOMAX) 0.4 MG CAPS capsule Take 0.4 mg by mouth daily.    [provider]  timolol (TIMOPTIC) 0.5 % ophthalmic solution Place 1 drop into the right eye 2 (two) times daily. 01/30/19   [provider]  triamterene-hydrochlorothiazide (MAXZIDE-25) 37.5-25 MG tablet Take 1 tablet by mouth daily. 11/16/14   [provider]  zolpidem (AMBIEN) 5 MG tablet Take 5 mg by mouth at bedtime as needed. 05/15/23   [provider]    Physical Exam: Vitals:   07/20/23 1435 07/20/23 1445 07/20/23 1500  BP: (!) 184/77 (!) 164/86 (!) 165/78  Pulse: 75 74 74  Resp: 18 13 18   Temp: 98.6 F (37 C)    SpO2: 98% 98% 97%   Physical Exam Constitutional:      Appearance: Normal appearance. He is obese. He is not ill-appearing.  HENT:     Head: Normocephalic and atraumatic.     Mouth/Throat:     Mouth: Mucous membranes are moist.     Pharynx: Oropharynx is clear. No oropharyngeal exudate.  Eyes:     General: No scleral icterus.    Extraocular Movements: Extraocular movements intact.     Pupils: Pupils are equal, round, and reactive to light.  Cardiovascular:     Rate and Rhythm: Normal rate and regular rhythm.     Heart sounds: Normal heart sounds. No murmur heard.    No friction rub. No gallop.     Comments: Unable to assess DP pulse in LLE due to splint. Pulmonary:     Effort: Pulmonary effort is normal. No respiratory distress.     Breath sounds: Normal breath sounds. No wheezing, rhonchi or rales.  Abdominal:     General: Bowel sounds are normal. There is no distension.     Palpations: Abdomen is soft.     Tenderness: There is no abdominal tenderness. There is no guarding or rebound.  Musculoskeletal:     Cervical back: Normal range of motion.     Comments: Left ankle splint in place. 1+ pitting edema in RLE up to mid-shin (unable to assess LLE due to splint). Able to wiggle toes bilaterally.  Skin:    General: Skin is dry.     Comments: Skin on left leg colder than that of right leg, appears chronic.  Neurological:     General: No focal deficit present.     Mental Status: He is alert and oriented to person, place, and time.     Comments: Sensation intact in distal toes bilaterally.  Psychiatric:        Mood and Affect: Mood normal.        Behavior: Behavior normal.     Data Reviewed:  There are no new results to review at this time.    Latest Ref Rng & Units 07/20/2023    2:49 PM 01/08/2023     6:23 AM 09/06/2021  8:39 AM  CBC  WBC 4.0 - 10.5 K/uL 7.2   8.2   Hemoglobin 13.0 - 17.0 g/dL 16.1  09.6  04.5   Hematocrit 39.0 - 52.0 % 42.4  45.0  44.3   Platelets 150 - 400 K/uL 159   192       Latest Ref Rng & Units 07/20/2023    2:49 PM 01/08/2023    6:23 AM 09/06/2021    8:39 AM  BMP  Glucose 70 - 99 mg/dL 409  811  914   BUN 8 - 23 mg/dL 21  22  14    Creatinine 0.61 - 1.24 mg/dL 7.82  9.56  2.13   Sodium 135 - 145 mmol/L 135  138  137   Potassium 3.5 - 5.1 mmol/L 3.7  4.3  4.2   Chloride 98 - 111 mmol/L 100  100  101   CO2 22 - 32 mmol/L 28   28   Calcium 8.9 - 10.3 mg/dL 8.4   9.0    CT ANKLE LEFT WO CONTRAST Result Date: 07/20/2023 CLINICAL DATA:  Ankle fracture EXAM: CT OF THE LEFT ANKLE WITHOUT CONTRAST TECHNIQUE: Multidetector CT imaging of the left ankle was performed according to the standard protocol. Multiplanar CT image reconstructions were also generated. RADIATION DOSE REDUCTION: This exam was performed according to the departmental dose-optimization program which includes automated exposure control, adjustment of the mA and/or kV according to patient size and/or use of iterative reconstruction technique. COMPARISON:  Radiographs from 07/20/2023 FINDINGS: Bones/Joint/Cartilage Plaster splint in place. Stage 4 Weber B fracture dislocation of the ankle noted with oblique lateral malleolar component, transverse medial malleolar component, and a small oblique posterior malleolar component. There is about 2 mm distraction at the lateral malleolar component and some minimal comminution distally at the lateral malleolus. The medial malleolar and posterior malleolar components are nondisplaced. Currently tibiotalar alignment is normal. No other regional fractures. Dorsal spurring of the talar head with some chronically fragmented spurring at the dorsal talonavicular articulation. Ligaments Suboptimally assessed by CT. Muscles and Tendons No flexor tendon entrapment.  No  significant abnormality identified. Soft tissues Circumferential subcutaneous edema in the ankle, tracking into the dorsum of the foot. Atheromatous vascular calcifications noted. IMPRESSION: 1. Stage 4 Weber B fracture dislocation of the ankle, with oblique lateral malleolar component, transverse medial malleolar component, and a small oblique posterior malleolar component. There is about 2 mm distraction at the lateral malleolar component and some minimal comminution distally at the lateral malleolus. The medial malleolar and posterior malleolar components are nondisplaced. Currently tibiotalar alignment is normal. 2. Circumferential subcutaneous edema in the ankle, tracking into the dorsum of the foot. 3. Atheromatous vascular calcifications. Electronically Signed   By: Gaylyn Rong M.D.   On: 07/20/2023 16:17   DG Ankle Complete Left Result Date: 07/20/2023 CLINICAL DATA:  Ankle fracture EXAM: LEFT ANKLE COMPLETE - 3+ VIEW COMPARISON:  Radiographs from [facility] dated 07/20/2023 at 12:42 p.m. FINDINGS: Interval placement of plaster cast or splint. Weber B trimalleolar fracture dislocation observed with bony detail obscured by the cast; near anatomic positioning of the fracture fragments. IMPRESSION: 1. Interval placement of plaster cast or splint. 2. Weber B trimalleolar fracture dislocation, fragments are in near anatomic alignment. Electronically Signed   By: Gaylyn Rong M.D.   On: 07/20/2023 16:11    Assessment and Plan: No notes have been filed under this hospital service. Service: Hospitalist   Bimalleolar left ankle fracture secondary to mechanical fall Patient with history  of inclusion body myositis causing chronic lower extremity weakness, presenting after mechanical fall yesterday and found to have a left ankle fracture on imaging.  Orthopedic surgery consulted by ED provider, tentative plan for left ankle ORIF tomorrow morning.  Patient currently has a left ankle  splint in place.  Per orthopedic surgery, will him nonweightbearing to the left lower extremity and make him n.p.o. after midnight.  Patient took last dose of Plavix this morning.  As long as Plavix dose does not prevent operative repair tomorrow morning, okay to proceed with ORIF tomorrow morning.  I have conveyed this to orthopedic surgery. -Orthopedic surgery following, appreciate management -Nonweightbearing to left lower extremity -Splint currently in place -N.p.o. at midnight with tentative plan for left ankle ORIF tomorrow morning -Holding home Plavix -Pain control with Tylenol every 6 hours as needed, oxycodone 5 mg every 4 hours as needed, Dilaudid 0.5 mg every 3 hours as needed -Admit to inpatient -Holding DVT prophylaxis in anticipation for surgery tomorrow  PAD status post angioplasty of the left SFA in 2018 Hyperlipidemia -Stable per ABI performed during recent cardiology visit with Dr. Jacinto Halim -Holding home Plavix in anticipation for ORIF tomorrow -Resume home Crestor 10 mg daily and Zetia 10 mg daily  Type 2 diabetes with hyperglycemia Last A1c on file 8.4% about a year ago.  Random blood glucose 188 on BMP.  CBG with glucose 83.  Patient is taking Novolin 70/30 at about 50 units twice daily along with Mounjaro 5 mg weekly.  States that his blood sugars have been somewhat labile recently and thinks that he may need to decrease his insulin dose.  He did take a 50 units of his Novolin 70/30 this morning.  Will start him on 20 units Lantus nightly along with moderate SSI given he will be NPO after midnight.  Have instructed RN and placed in admission orders to only provide 10 units Lantus should patient eat less than half of his meal. -Follow-up hemoglobin A1c -Lantus 20 units nightly (advised RN to give half dose if eats less than half of his dinner) -Moderate SSI -Trend CBGs, goal 140-180  Acute on chronic lower extremity edema No history of CHF per chart review.  However patient  has noted chronic lower extremity swelling and is taking Lasix 40 mg twice daily at home.  He does follow cardiology in the outpatient setting.  Last echocardiogram in 2021 with LVEF 60 to 65%, moderate LVH, indeterminate diastolic filling pattern.  He does have lower extremity edema on exam and reports that it is worse than usual, confirmed by daughter.  Will provide 1 dose of IV Lasix 40 mg.  Will also check BNP and obtain a formal echocardiogram to reassess heart function. -IV Lasix 40 mg once, can consider further diuresis tomorrow after reevaluation or resume home Lasix dose -Follow-up BNP -Follow-up echocardiogram -Strict I/O's, daily weights -He has been adherent with his home metoprolol tartrate so we will continue on admission  Hypertension Blood pressures ranging in the 130s-160s/60s-80s.  He is on metoprolol tartrate 25 mg twice daily and losartan 25 mg daily.  Will resume his home medications. -Resume home metoprolol tartrate and losartan -Trend blood pressure curve  CKD 3A Baseline creatinine around 1.2-1.4.  On admission, creatinine 1.3. -Kidney function stable and at his baseline -Avoid nephrotoxic agents as able -Trend kidney function -Monitor urine output  OSA -Resume home CPAP nightly  Inclusion body myositis -Noted on workup at [facility], has positive NT5C1A antibody -PT/OT eval  BPH -Resume home  Flomax  History of stage I prostate cancer status post radiation therapy -Follows urology for PSA monitoring posttreatment    Advance Care Planning:   Code Status: Full Code   Consults: orthopedic surgery  Family Communication: updated family at bedside  Severity of Illness: The appropriate patient status for this patient is INPATIENT. Inpatient status is judged to be reasonable and necessary in order to provide the required intensity of service to ensure the patient's safety. The patient's presenting symptoms, physical exam findings, and initial radiographic and  laboratory data in the context of their chronic comorbidities is felt to place them at high risk for further clinical deterioration. Furthermore, it is not anticipated that the patient will be medically stable for discharge from the hospital within 2 midnights of admission.   * I certify that at the point of admission it is my clinical judgment that the patient will require inpatient hospital care spanning beyond 2 midnights from the point of admission due to high intensity of service, high risk for further deterioration and high frequency of surveillance required.*  Portions of this note were generated with Dragon dictation software. Dictation errors may occur despite best attempts at proofreading.  Author: Briscoe Burns, MD 07/20/2023 5:00 PM  For on call review [email or web address].

## 2023-07-20 NOTE — Progress Notes (Signed)
 Patient is known to our office and presented today to [facility] with left ankle pain. X-rays taken showed a bimalleolar left ankle fracture.   He was recently seen by Dr. Nadara Eaton on 07/05/23 where he was found to have absent pedal pulses on the left and left leg colder than the right. ABI was obtained showing no change in blood supply compared to previous ABI 06/24/19.  Patient has multiple risk factors including HTN, HLD, uncontrolled DM, CHF on lasix, PAD s/p angioplasty to left SFA in 2018 on plavix, morbid obesity, inclusion body myositis.  Plan for admission to medicine service through the ED, possible ORIF left ankle tomorrow with Dr. Eulah Pont if medically optimized. If not, will plan for surgical intervention early next week once optimized.   Janine Ores, PA-C [facility]

## 2023-07-20 NOTE — Progress Notes (Signed)
   07/20/23 2044  BiPAP/CPAP/SIPAP  $ Non-Invasive Home Ventilator  Initial  $ Face Mask Medium Yes  BiPAP/CPAP/SIPAP Pt Type Adult  BiPAP/CPAP/SIPAP DREAMSTATIOND  Mask Type Full face mask  Dentures removed? Not applicable  Mask Size Medium  FiO2 (%) 21 %  Patient Home Equipment No  Auto Titrate Yes (6-20)  BiPAP/CPAP /SiPAP Vitals  Pulse Rate 77  Resp 18  SpO2 99 %  Bilateral Breath Sounds Clear;Diminished  MEWS Score/Color  MEWS Score 0  MEWS Score Color Green

## 2023-07-20 NOTE — ED Notes (Signed)
 Pt left leg elevated on pillow and repositioned for comfort

## 2023-07-20 NOTE — ED Triage Notes (Signed)
 Pt sent from ortho for L ankle surgery tomorrow. D/t medical history they felt he needed to have the surgery here. Cast already in place.

## 2023-07-20 NOTE — Consult Note (Signed)
 ORTHOPAEDIC CONSULTATION  REQUESTING PHYSICIAN: Cathren Laine, MD  Chief Complaint: Left ankle pain  HPI: ARAN MENNING is a 83 y.o. male with history of HTN, HLD, uncontrolled DM, CHF on lasix, PAD s/p angioplasty to left SFA in 2018 on plavix, morbid obesity, inclusion body myositis who complains of  left ankle pain.  He originally presented to the [facility] urgent care earlier today x-rays were taken showing a bimalleolar left ankle fracture.  Pain is moderate to severe at the left ankle worsening with movement and weightbearing and better with rest.  Patient was placed into a short leg splint and referred to the ED for further management given his multiple medical issues that may impede healing.   He was recently seen by Dr. Nadara Eaton on 07/05/23 where he was found to have absent pedal pulses on the left and left leg colder than the right. ABI was obtained showing no change in blood supply compared to previous ABI 06/24/19.   Past Medical History:  Diagnosis Date   Arthritis    CKD (chronic kidney disease)    CKD stage III (01/2018)   Daytime somnolence    Diabetes mellitus without complication (HCC)    Fracture    right fibula/wears boot cast   GERD (gastroesophageal reflux disease)    Glaucoma    Hypercholesteremia    Hyperlipidemia    Hypertension    Mold exposure    7 yrs ago   PAD (peripheral artery disease) (HCC)    Prostate cancer (HCC)    Sleep apnea    uses cpap   Uses walker    Wears glasses    Past Surgical History:  Procedure Laterality Date   GOLD SEED IMPLANT N/A 01/08/2023   Procedure: GOLD SEED IMPLANT;  Surgeon: Despina Arias, MD;  Location: [facility];  Service: Urology;  Laterality: N/A;  30 MINUTES   LOWER EXTREMITY ANGIOGRAPHY N/A 06/12/2016   Procedure: Lower Extremity Angiography;  Surgeon: Yates Decamp, MD;  Location: [facility] INVASIVE CV LAB;  Service: Cardiovascular;  Laterality: N/A;   MUSCLE BIOPSY Right 05/06/2018   Procedure: right  vastus lateralis muscle biopsy;  Surgeon: Julio Sicks, MD;  Location: [facility] OR;  Service: Neurosurgery;  Laterality: Right;   ORIF ANKLE FRACTURE Right 10/02/2012   Procedure: OPEN REDUCTION INTERNAL FIXATION (ORIF) ANKLE FRACTURE;  Surgeon: Cammy Copa, MD;  Location: [facility] ORS;  Service: Orthopedics;  Laterality: Right;   PERIPHERAL VASCULAR ATHERECTOMY Left 06/12/2016   Procedure: Peripheral Vascular Atherectomy-Left Popliteal;  Surgeon: Yates Decamp, MD;  Location: [facility] INVASIVE CV LAB;  Service: Cardiovascular;  Laterality: Left;   PERIPHERAL VASCULAR INTERVENTION Left 06/12/2016   Procedure: Peripheral Vascular Intervention- DCB Left Popliteal;  Surgeon: Yates Decamp, MD;  Location: [facility] INVASIVE CV LAB;  Service: Cardiovascular;  Laterality: Left;   PROSTATE BIOPSY     SPACE OAR INSTILLATION N/A 01/08/2023   Procedure: SPACE OAR INSTILLATION;  Surgeon: Despina Arias, MD;  Location: [facility];  Service: Urology;  Laterality: N/A;   Social History   Socioeconomic History   Marital status: Widowed    Spouse name: Not on file   Number of children: 1   Years of education: Not on file   Highest education level: Not on file  Occupational History   Not on file  Tobacco Use   Smoking status: Former    Current packs/day: 0.00    Average packs/day: 1 pack/day for 20.0 years (20.0 ttl pk-yrs)    Types: Cigarettes  Start date: 08/22/1961    Quit date: 08/22/1981    Years since quitting: 41.9   Smokeless tobacco: Never  Vaping Use   Vaping status: Never Used  Substance and Sexual Activity   Alcohol use: No    Alcohol/week: 0.0 standard drinks of alcohol   Drug use: No   Sexual activity: Never  Other Topics Concern   Not on file  Social History Narrative   ** Merged History Encounter **       Social Drivers of Health   Financial Resource Strain: Not on file  Food Insecurity: No Food Insecurity (10/31/2022)   Hunger Vital Sign    Worried About Running Out of Food in the  Last Year: Never true    Ran Out of Food in the Last Year: Never true  Transportation Needs: No Transportation Needs (10/31/2022)   PRAPARE - Administrator, Civil Service (Medical): No    Lack of Transportation (Non-Medical): No  Physical Activity: Not on file  Stress: Not on file  Social Connections: Not on file   Family History  Problem Relation Age of Onset   Heart disease Father    Prostate cancer Brother    Cancer Brother    Diabetes Brother    Colon cancer Neg Hx    Sleep apnea Neg Hx    Allergies  Allergen Reactions   Metformin And Related Other (See Comments)    Reaction?   Pollen Extract Other (See Comments)   Simbrinza [Brinzolamide-Brimonidine] Other (See Comments)    Drainage, redness to eyes     Positive ROS: All other systems have been reviewed and were otherwise negative with the exception of those mentioned in the HPI and as above.  Physical Exam: General: Alert, no acute distress Cardiovascular: No pretibial edema Respiratory: No cyanosis, no use of accessory musculature GI: No organomegaly, abdomen is soft and non-tender Skin: No lesions in the area of chief complaint Neurologic: Sensation intact distally Psychiatric: Patient is competent for consent with normal mood and affect Lymphatic: No axillary or cervical lymphadenopathy  MUSCULOSKELETAL: Left ankle in short leg splint. Toes warm. Sensation intact to toes.  Assessment: Bimalleolar ankle fracture in patient with multiple impediments to healing including uncontrolled diabetes mellitus and PAD  Plan: Plan for admission to medical service for medical optimization ahead of possible surgical intervention. If medically optimized by tomorrow, Dr Eulah Pont could possibly perform Left Ankle ORIF tomorrow morning, but will wait on further recommendations from medical team.  In the meantime, NWB LLE. Keep splint in place. I will order further imaging.  NPO after midnight.    Armida Sans,  PA-C    07/20/2023 2:58 PM

## 2023-07-20 NOTE — ED Provider Notes (Addendum)
 [place] EMERGENCY DEPARTMENT AT [facility] Provider Note   CSN: [id number] Arrival date & time: 07/20/23  1418     History  Chief Complaint  Patient presents with   Fall   Ankle Pain    Andrew Larsen is a 83 y.o. male.  Pt indicates is generally weak (at baseline) and had a fall yesterday as legs occasionally give way. He fell onto left ankle, with  acute pain and swelling to ankle. He went to ortho urgent care clinic today, had xrays, and was placed in posterior splint - he was told to go to ER for admission for planned operative repair tomorrow. Pt/family indicates skin is closed, no lacs or abrasions. No foot numbness/weakness. Denies head injury w fall. No loc. No headache since fall. No neck or back pain. Pt denies other pain or injury. Indicates overall recent health at baseline, denies acutely feeling bad of ill (other than left ankle pain/injury).   The history is provided by the patient, a relative and medical records.       Home Medications Prior to Admission medications   Medication Sig Start Date End Date Taking? Authorizing Provider  clopidogrel (PLAVIX) 75 MG tablet Take 1 tablet (75 mg total) by mouth daily. 07/05/23   Yates Decamp, MD  Continuous Blood Gluc Sensor (FREESTYLE LIBRE 2 SENSOR) MISC Inject 1 Device into the skin every 14 (fourteen) days. 08/22/21   [provider]  dorzolamide (TRUSOPT) 2 % ophthalmic solution Place 1 drop into the right eye 2 (two) times daily.    [provider]  ezetimibe (ZETIA) 10 MG tablet TAKE 1 TABLET(10 MG) BY MOUTH DAILY AFTER SUPPER 03/19/23   Yates Decamp, MD  furosemide (LASIX) 40 MG tablet Take 1 tablet (40 mg total) by mouth daily. Patient taking differently: Take 40 mg by mouth 2 (two) times daily. 12/15/18   Angiulli, Mcarthur Rossetti, PA-C  gabapentin (NEURONTIN) 300 MG capsule Take 2 capsules (600 mg total) by mouth 3 (three) times daily. Patient taking differently: Take 900 mg by mouth 3 (three)  times daily. 12/15/18   Angiulli, Mcarthur Rossetti, PA-C  latanoprost (XALATAN) 0.005 % ophthalmic solution Place 1 drop into both eyes at bedtime.    [provider]  losartan (COZAAR) 25 MG tablet TAKE 1 TABLET(25 MG) BY MOUTH DAILY Patient taking differently: Take 25 mg by mouth daily. 10/05/19   Yates Decamp, MD  metoprolol tartrate (LOPRESSOR) 25 MG tablet Take 1 tablet (25 mg total) by mouth 2 (two) times daily. 12/15/18   Angiulli, Mcarthur Rossetti, PA-C  MOUNJARO 5 MG/0.5ML Pen Inject 5 mg into the skin once a week. 06/26/23   [provider]  Multiple Vitamin (MULTIVITAMIN WITH MINERALS) TABS tablet Take 1 tablet by mouth daily.    [provider]  NOVOLIN 70/30 (70-30) 100 UNIT/ML injection Inject into the skin in the morning and at bedtime. 07/03/23   [provider]  potassium chloride SA (KLOR-CON M) 20 MEQ tablet Take 20 mEq by mouth daily.    [provider]  PRESCRIPTION MEDICATION CPAP- At bedtime    [provider]  rosuvastatin (CRESTOR) 10 MG tablet Take 10 mg by mouth once a week. 05/26/23   [provider]  solifenacin (VESICARE) 5 MG tablet Take 5 mg by mouth daily. 08/22/22   [provider]  tamsulosin (FLOMAX) 0.4 MG CAPS capsule Take 0.4 mg by mouth daily.    [provider]  timolol (TIMOPTIC) 0.5 % ophthalmic solution  Place 1 drop into the right eye 2 (two) times daily. 01/30/19   [provider]  triamterene-hydrochlorothiazide (MAXZIDE-25) 37.5-25 MG tablet Take 1 tablet by mouth daily. 11/16/14   [provider]  zolpidem (AMBIEN) 5 MG tablet Take 5 mg by mouth at bedtime as needed. 05/15/23   [provider]      Allergies    Metformin and related, Pollen extract, and Simbrinza [brinzolamide-brimonidine]    Review of Systems   Review of Systems  Constitutional:  Negative for fever.  Respiratory:  Negative for shortness of breath.   Cardiovascular:  Negative for chest pain.   Gastrointestinal:  Negative for abdominal pain, nausea and vomiting.  Musculoskeletal:        Left ankle injury.   Skin:  Negative for wound.  Neurological:  Negative for weakness, numbness and headaches.    Physical Exam Updated Vital Signs BP (!) 165/78   Pulse 74   Temp 98.6 F (37 C)   Resp 18   SpO2 97%  Physical Exam Vitals and nursing note reviewed.  Constitutional:      Appearance: Normal appearance. He is well-developed.  HENT:     Head: Atraumatic.     Nose: Nose normal.     Mouth/Throat:     Mouth: Mucous membranes are moist.  Eyes:     General: No scleral icterus.    Conjunctiva/sclera: Conjunctivae normal.     Pupils: Pupils are equal, round, and reactive to light.  Neck:     Trachea: No tracheal deviation.  Cardiovascular:     Rate and Rhythm: Normal rate and regular rhythm.     Pulses: Normal pulses.     Heart sounds: Normal heart sounds. No murmur heard.    No friction rub. No gallop.  Pulmonary:     Effort: Pulmonary effort is normal. No accessory muscle usage or respiratory distress.     Breath sounds: Normal breath sounds.  Chest:     Chest wall: No tenderness.  Abdominal:     General: There is no distension.     Palpations: Abdomen is soft.     Tenderness: There is no abdominal tenderness.  Musculoskeletal:     Cervical back: Normal range of motion and neck supple. No rigidity or tenderness.     Comments: Left foot in posterior splint. Toes w normal cap refill. Able to move all toes comfortably. Dp palp. Otherwise, good rom bil extremities without pain or other focal bony tenderness. CTLS spine, non tender, aligned, no step off.   Skin:    General: Skin is warm and dry.     Findings: No rash.  Neurological:     Mental Status: He is alert.     Comments: Alert, speech clear. GCS 15. Motor/sens grossly intact bil. Left foot/toes appear nvi.   Psychiatric:        Mood and Affect: Mood normal.     ED Results / Procedures / Treatments    Labs (all labs ordered are listed, but only abnormal results are displayed) Results for orders placed or performed during the hospital encounter of 07/20/23  CBC   Collection Time: 07/20/23  2:49 PM  Result Value Ref Range   WBC 7.2 4.0 - 10.5 K/uL   RBC 4.43 4.22 - 5.81 MIL/uL   Hemoglobin 13.7 13.0 - 17.0 g/dL   HCT 82.9 56.2 - 13.0 %   MCV 95.7 80.0 - 100.0 fL   MCH 30.9 26.0 - 34.0 pg   MCHC 32.3  30.0 - 36.0 g/dL   RDW 16.1 09.6 - 04.5 %   Platelets 159 150 - 400 K/uL   nRBC 0.0 0.0 - 0.2 %  Basic metabolic panel   Collection Time: 07/20/23  2:49 PM  Result Value Ref Range   Sodium 135 135 - 145 mmol/L   Potassium 3.7 3.5 - 5.1 mmol/L   Chloride 100 98 - 111 mmol/L   CO2 28 22 - 32 mmol/L   Glucose, Bld 188 (H) 70 - 99 mg/dL   BUN 21 8 - 23 mg/dL   Creatinine, Ser 4.09 (H) 0.61 - 1.24 mg/dL   Calcium 8.4 (L) 8.9 - 10.3 mg/dL   GFR, Estimated 56 (L) >60 mL/min   Anion gap 7 5 - 15  Type and screen   Collection Time: 07/20/23  2:49 PM  Result Value Ref Range   ABO/RH(D) PENDING    Antibody Screen PENDING    Sample Expiration      07/23/2023,2359 Performed at [facility], [address], [place], [place] [address]    VAS [place] ABI WITH/WO TBI Result Date: 07/15/2023  LOWER EXTREMITY DOPPLER STUDY Patient Name:  HILLMAN ATTIG  Date of Exam:   07/15/2023 Medical Rec #: [id number]        Accession #:    [id number] Date of Birth: Sep 14, 1940        Patient Gender: M Patient Age:   79 years Exam Location:  [facility] Procedure:      VAS [place] ABI WITH/WO TBI Referring Phys: Yates Decamp --------------------------------------------------------------------------------  Indications: Peripheral artery disease. Patient has absent pedal pulses on the              left and is colder than the right although there is no evidence of              critical limb ischemia. History of left SFA angioplasty in 2018. He              denies claudication symptoms but endorses leg weakness  and needs              frequent use of a wheelchair due to this. High Risk         Hypertension, hyperlipidemia, Diabetes, past history of Factors:          smoking.  Comparison       Previous ABIs 06/24/19 were 0.77 on the right and 0.85 on the Study:           left. Performing Technologist: Olegario Shearer RVT  Examination Guidelines: A complete evaluation includes at minimum, Doppler waveform signals and systolic blood pressure reading at the level of bilateral brachial, anterior tibial, and posterior tibial arteries, when vessel segments are accessible. Bilateral testing is considered an integral part of a complete examination. Photoelectric Plethysmograph (PPG) waveforms and toe systolic pressure readings are included as required and additional duplex testing as needed. Limited examinations for reoccurring indications may be performed as noted.  ABI Findings: +---------+------------------+-----+-----------+---------+ Right    Rt Pressure (mmHg)IndexWaveform   Comment   +---------+------------------+-----+-----------+---------+ Brachial 162                                         +---------+------------------+-----+-----------+---------+ PTA  absent     inaudible +---------+------------------+-----+-----------+---------+ PERO     138               0.85 multiphasic          +---------+------------------+-----+-----------+---------+ DP       139               0.86 monophasic           +---------+------------------+-----+-----------+---------+ Great Toe82                0.51 Abnormal             +---------+------------------+-----+-----------+---------+ +---------+------------------+-----+-------------------+---------+ Left     Lt Pressure (mmHg)IndexWaveform           Comment   +---------+------------------+-----+-------------------+---------+ Brachial 150                                                  +---------+------------------+-----+-------------------+---------+ PTA      115               0.71 dampened monophasic          +---------+------------------+-----+-------------------+---------+ PERO     81                0.50 dampened monophasic          +---------+------------------+-----+-------------------+---------+ DP                              absent             inaudible +---------+------------------+-----+-------------------+---------+ Great Toe43                0.27 Abnormal                     +---------+------------------+-----+-------------------+---------+ +-------+-----------+-----------+------------+------------+ ABI/TBIToday's ABIToday's TBIPrevious ABIPrevious TBI +-------+-----------+-----------+------------+------------+ Right  0.86       0.51       0.77                     +-------+-----------+-----------+------------+------------+ Left   0.71       0.27       0.85                     +-------+-----------+-----------+------------+------------+ Right ABIs appear increased compared to prior study on 06/24/19. Left ABIs appear decreased compared to prior study on 06/24/19.  Summary: Right: Resting right ankle-brachial index indicates mild right lower extremity arterial disease. The right toe-brachial index is abnormal. Left: Resting left ankle-brachial index indicates moderate left lower extremity arterial disease. The left toe-brachial index is abnormal. *See table(s) above for measurements and observations.  Electronically signed by Charlton Haws MD on 07/15/2023 at 12:16:08 PM.    Final     EKG EKG Interpretation Date/Time:  Saturday July 20 2023 14:39:52 EDT Ventricular Rate:  76 PR Interval:  225 QRS Duration:  79 QT Interval:  376 QTC Calculation: 423 R Axis:   30  Text Interpretation: Sinus rhythm Prolonged PR interval Confirmed by Cathren Laine ([id number]) on 07/20/2023 2:57:10 PM  Radiology No results found.  Procedures Procedures     Medications Ordered in ED Medications  morphine (PF) 4 MG/ML injection 4 mg (4 mg Intravenous Given 07/20/23 1458)  ondansetron (ZOFRAN) injection 4 mg (4 mg Intravenous Given 07/20/23 1458)  ED Course/ Medical Decision Making/ A&P                                 Medical Decision Making Problems Addressed: Accidental fall, initial encounter: acute illness or injury with systemic symptoms that poses a threat to life or bodily functions Closed fracture of left ankle, initial encounter: acute illness or injury that poses a threat to life or bodily functions Stage 3a chronic kidney disease (HCC): chronic illness or injury that poses a threat to life or bodily functions  Amount and/or Complexity of Data Reviewed Independent Historian: spouse    Details: hx External Data Reviewed: labs and notes. Labs: ordered. Decision-making details documented in ED Course. ECG/medicine tests: ordered and independent interpretation performed. Decision-making details documented in ED Course. Discussion of management or test interpretation with external provider(s): Ortho, medicine  Risk Prescription drug management. Parenteral controlled substances. Decision regarding hospitalization.   Iv ns.Labs ordered/sent.   Differential diagnosis includes unstable ankle fracture, fall, etc. Dispo decision including potential need for admission considered - will get labs and reassess.   Reviewed nursing notes and prior charts for additional history. External reports reviewed. Additional history from: family.   Morphine iv. Zofran iv. LR bolus.   Cardiac monitor: sinus rhythm, rate 74.  Labs reviewed/interpreted by me - wbc 7, hct 42.   Dr Eulah Pont (or on-call orthopedist for him) consulted. Discussed pt with on-call provider for [facility] - she indicates the imaging they have is adequate (and they will add additional imaging if they need it) - she indicates Dr Dion Saucier requests hospitalist  admission and medical clearance as hx htn, ckd, heart disease, pvd, dm, etc.   Hospitalists consulted for admission. Discussed pt, will admit.   Pain improved w meds.              Final Clinical Impression(s) / ED Diagnoses Final diagnoses:  Accidental fall, initial encounter  Closed fracture of left ankle, initial encounter  Stage 3a chronic kidney disease [facility])    Rx / DC Orders ED Discharge Orders     None           Cathren Laine, MD 07/20/23 1554

## 2023-07-21 ENCOUNTER — Other Ambulatory Visit (HOSPITAL_COMMUNITY)

## 2023-07-21 ENCOUNTER — Inpatient Hospital Stay (HOSPITAL_COMMUNITY): Admitting: Anesthesiology

## 2023-07-21 ENCOUNTER — Encounter (HOSPITAL_COMMUNITY)
Admission: EM | Disposition: A | Discharge status: Skilled Nursing Facility | Payer: Self-pay | Source: Ambulatory Visit | Attending: Internal Medicine

## 2023-07-21 ENCOUNTER — Inpatient Hospital Stay (HOSPITAL_COMMUNITY): Admit: 2023-07-21 | Admitting: Orthopedic Surgery

## 2023-07-21 DIAGNOSIS — Z87891 Personal history of nicotine dependence: Secondary | ICD-10-CM

## 2023-07-21 DIAGNOSIS — S82892A Other fracture of left lower leg, initial encounter for closed fracture: Secondary | ICD-10-CM

## 2023-07-21 DIAGNOSIS — I129 Hypertensive chronic kidney disease with stage 1 through stage 4 chronic kidney disease, or unspecified chronic kidney disease: Secondary | ICD-10-CM

## 2023-07-21 DIAGNOSIS — N1832 Chronic kidney disease, stage 3b: Secondary | ICD-10-CM | POA: Diagnosis not present

## 2023-07-21 DIAGNOSIS — S82842A Displaced bimalleolar fracture of left lower leg, initial encounter for closed fracture: Secondary | ICD-10-CM | POA: Diagnosis not present

## 2023-07-21 HISTORY — PX: ORIF ANKLE FRACTURE: SHX5408

## 2023-07-21 LAB — GLUCOSE, CAPILLARY
Glucose-Capillary: 161 mg/dL — ABNORMAL HIGH (ref 70–99)
Glucose-Capillary: 119 mg/dL — ABNORMAL HIGH (ref 70–99)
Glucose-Capillary: 188 mg/dL — ABNORMAL HIGH (ref 70–99)
Glucose-Capillary: 210 mg/dL — ABNORMAL HIGH (ref 70–99)

## 2023-07-21 LAB — HEMOGLOBIN A1C
Hgb A1c MFr Bld: 7.3 % — ABNORMAL HIGH (ref 4.8–5.6)
Mean Plasma Glucose: 162.81 mg/dL

## 2023-07-21 LAB — CBC
HCT: 39.8 % (ref 39.0–52.0)
Hemoglobin: 13 g/dL (ref 13.0–17.0)
MCH: 31.8 pg (ref 26.0–34.0)
MCHC: 32.7 g/dL (ref 30.0–36.0)
MCV: 97.3 fL (ref 80.0–100.0)
Platelets: 152 10*3/uL (ref 150–400)
RBC: 4.09 MIL/uL — ABNORMAL LOW (ref 4.22–5.81)
RDW: 14.1 % (ref 11.5–15.5)
WBC: 7.2 10*3/uL (ref 4.0–10.5)
nRBC: 0 % (ref 0.0–0.2)

## 2023-07-21 LAB — BASIC METABOLIC PANEL
Anion gap: 8 (ref 5–15)
BUN: 20 mg/dL (ref 8–23)
CO2: 27 mmol/L (ref 22–32)
Calcium: 8.3 mg/dL — ABNORMAL LOW (ref 8.9–10.3)
Chloride: 102 mmol/L (ref 98–111)
Creatinine, Ser: 1.35 mg/dL — ABNORMAL HIGH (ref 0.61–1.24)
GFR, Estimated: 53 mL/min — ABNORMAL LOW (ref >60)
Glucose, Bld: 160 mg/dL — ABNORMAL HIGH (ref 70–99)
Potassium: 3.9 mmol/L (ref 3.5–5.1)
Sodium: 137 mmol/L (ref 135–145)

## 2023-07-21 LAB — BRAIN NATRIURETIC PEPTIDE: B Natriuretic Peptide: 36.4 pg/mL (ref 0.0–100.0)

## 2023-07-21 LAB — PROTIME-INR
INR: 1 (ref 0.8–1.2)
Prothrombin Time: 13.5 s (ref 11.4–15.2)

## 2023-07-21 LAB — APTT: aPTT: 27 s (ref 24–36)

## 2023-07-21 SURGERY — OPEN REDUCTION INTERNAL FIXATION (ORIF) ANKLE FRACTURE
Anesthesia: General | Site: Ankle | Laterality: Left

## 2023-07-21 MED ORDER — METOCLOPRAMIDE HCL 5 MG/ML IJ SOLN: 5.0000 mg | Freq: Three times a day (TID) | INTRAMUSCULAR | Status: DC | PRN

## 2023-07-21 MED ORDER — METHOCARBAMOL 500 MG PO TABS
500.0000 mg | ORAL_TABLET | Freq: Four times a day (QID) | ORAL | Status: DC | PRN
  Administered 2023-07-23 – 2023-07-24 (×3): 500 mg via ORAL
  Filled 2023-07-21 (×3): qty 1

## 2023-07-21 MED ORDER — BUPIVACAINE HCL (PF) 0.5 % IJ SOLN
INTRAMUSCULAR | Status: DC | PRN
  Administered 2023-07-21: 15 mL via PERINEURAL
  Administered 2023-07-21: 25 mL via PERINEURAL

## 2023-07-21 MED ORDER — ACETAMINOPHEN 500 MG PO TABS
1000.0000 mg | ORAL_TABLET | Freq: Four times a day (QID) | ORAL | Status: AC
  Administered 2023-07-21 – 2023-07-22 (×4): 1000 mg via ORAL
  Filled 2023-07-21 (×4): qty 2

## 2023-07-21 MED ORDER — FENTANYL CITRATE (PF) 100 MCG/2ML IJ SOLN
INTRAMUSCULAR | Status: DC | PRN
  Administered 2023-07-21 (×2): 50 ug via INTRAVENOUS

## 2023-07-21 MED ORDER — SUGAMMADEX SODIUM 200 MG/2ML IV SOLN
INTRAVENOUS | Status: DC | PRN
  Administered 2023-07-21: 300 mg via INTRAVENOUS

## 2023-07-21 MED ORDER — 0.9 % SODIUM CHLORIDE (POUR BTL) OPTIME
TOPICAL | Status: DC | PRN
Start: 2023-07-21 — End: 2023-07-21
  Administered 2023-07-21: 1000 mL

## 2023-07-21 MED ORDER — LACTATED RINGERS IV SOLN: INTRAVENOUS | Status: DC | PRN

## 2023-07-21 MED ORDER — HYDROMORPHONE HCL 1 MG/ML IJ SOLN
0.5000 mg | INTRAMUSCULAR | Status: DC | PRN
  Administered 2023-07-21 – 2023-07-24 (×4): 1 mg via INTRAVENOUS
  Filled 2023-07-21 (×5): qty 1

## 2023-07-21 MED ORDER — OXYCODONE HCL 5 MG PO TABS
10.0000 mg | ORAL_TABLET | ORAL | Status: DC | PRN
  Administered 2023-07-23 – 2023-07-24 (×7): 10 mg via ORAL
  Filled 2023-07-21 (×7): qty 2

## 2023-07-21 MED ORDER — PROPOFOL 10 MG/ML IV BOLUS
INTRAVENOUS | Status: AC
  Filled 2023-07-21: qty 20

## 2023-07-21 MED ORDER — ONDANSETRON HCL 4 MG/2ML IJ SOLN
4.0000 mg | Freq: Four times a day (QID) | INTRAMUSCULAR | Status: DC | PRN
Start: 2023-07-21 — End: 2023-07-24

## 2023-07-21 MED ORDER — SUGAMMADEX SODIUM 200 MG/2ML IV SOLN
INTRAVENOUS | Status: AC
Start: 2023-07-21 — End: ?
  Filled 2023-07-21: qty 2

## 2023-07-21 MED ORDER — DEXAMETHASONE SODIUM PHOSPHATE 10 MG/ML IJ SOLN
INTRAMUSCULAR | Status: AC
  Filled 2023-07-21: qty 1

## 2023-07-21 MED ORDER — PANTOPRAZOLE SODIUM 40 MG PO TBEC
40.0000 mg | DELAYED_RELEASE_TABLET | Freq: Every day | ORAL | Status: DC
  Administered 2023-07-22 – 2023-07-24 (×3): 40 mg via ORAL
  Filled 2023-07-21 (×3): qty 1

## 2023-07-21 MED ORDER — METHOCARBAMOL 1000 MG/10ML IJ SOLN: 500.0000 mg | Freq: Four times a day (QID) | INTRAMUSCULAR | Status: DC | PRN

## 2023-07-21 MED ORDER — PHENYLEPHRINE HCL (PRESSORS) 10 MG/ML IV SOLN
INTRAVENOUS | Status: DC | PRN
  Administered 2023-07-21 (×4): 80 ug via INTRAVENOUS

## 2023-07-21 MED ORDER — DEXAMETHASONE SODIUM PHOSPHATE 4 MG/ML IJ SOLN
INTRAMUSCULAR | Status: DC | PRN
  Administered 2023-07-21: 5 mg via INTRAVENOUS

## 2023-07-21 MED ORDER — DEXAMETHASONE SODIUM PHOSPHATE 10 MG/ML IJ SOLN
INTRAMUSCULAR | Status: DC | PRN
  Administered 2023-07-21: 8 mg via INTRAVENOUS

## 2023-07-21 MED ORDER — CLOPIDOGREL BISULFATE 75 MG PO TABS
75.0000 mg | ORAL_TABLET | Freq: Every day | ORAL | Status: DC
  Administered 2023-07-22 – 2023-07-24 (×3): 75 mg via ORAL
  Filled 2023-07-21 (×4): qty 1

## 2023-07-21 MED ORDER — ONDANSETRON HCL 4 MG/2ML IJ SOLN
INTRAMUSCULAR | Status: DC | PRN
  Administered 2023-07-21: 4 mg via INTRAVENOUS

## 2023-07-21 MED ORDER — ACETAMINOPHEN 325 MG PO TABS: 325.0000 mg | ORAL_TABLET | Freq: Four times a day (QID) | ORAL | Status: DC | PRN

## 2023-07-21 MED ORDER — CEFAZOLIN SODIUM-DEXTROSE 2-4 GM/100ML-% IV SOLN
2.0000 g | Freq: Four times a day (QID) | INTRAVENOUS | Status: AC
  Administered 2023-07-21 – 2023-07-22 (×2): 2 g via INTRAVENOUS
  Filled 2023-07-21 (×2): qty 100

## 2023-07-21 MED ORDER — CEFAZOLIN SODIUM-DEXTROSE 2-4 GM/100ML-% IV SOLN
INTRAVENOUS | Status: AC
  Filled 2023-07-21: qty 100

## 2023-07-21 MED ORDER — PHENYLEPHRINE HCL-NACL 20-0.9 MG/250ML-% IV SOLN
INTRAVENOUS | Status: DC | PRN
  Administered 2023-07-21: 40 ug/min via INTRAVENOUS

## 2023-07-21 MED ORDER — ONDANSETRON HCL 4 MG PO TABS: 4.0000 mg | ORAL_TABLET | Freq: Four times a day (QID) | ORAL | Status: DC | PRN

## 2023-07-21 MED ORDER — METOCLOPRAMIDE HCL 5 MG PO TABS: 5.0000 mg | ORAL_TABLET | Freq: Three times a day (TID) | ORAL | Status: DC | PRN

## 2023-07-21 MED ORDER — CEFAZOLIN SODIUM-DEXTROSE 3-4 GM/150ML-% IV SOLN: 3.0000 g | INTRAVENOUS | Status: DC

## 2023-07-21 MED ORDER — ACETAMINOPHEN 500 MG PO TABS
ORAL_TABLET | ORAL | Status: AC
Start: 2023-07-21 — End: 2023-07-21
  Filled 2023-07-21: qty 2

## 2023-07-21 MED ORDER — CEFAZOLIN SODIUM-DEXTROSE 1-4 GM/50ML-% IV SOLN
INTRAVENOUS | Status: AC
Start: 2023-07-21 — End: 2023-07-21
  Filled 2023-07-21: qty 50

## 2023-07-21 MED ORDER — FENTANYL CITRATE (PF) 100 MCG/2ML IJ SOLN
INTRAMUSCULAR | Status: AC
  Filled 2023-07-21: qty 2

## 2023-07-21 MED ORDER — PROPOFOL 10 MG/ML IV BOLUS
INTRAVENOUS | Status: DC | PRN
  Administered 2023-07-21: 200 mg via INTRAVENOUS

## 2023-07-21 MED ORDER — OXYCODONE HCL 5 MG PO TABS
5.0000 mg | ORAL_TABLET | ORAL | Status: DC | PRN
  Administered 2023-07-22 (×2): 5 mg via ORAL
  Filled 2023-07-21 (×2): qty 1

## 2023-07-21 MED ORDER — DOCUSATE SODIUM 100 MG PO CAPS
100.0000 mg | ORAL_CAPSULE | Freq: Two times a day (BID) | ORAL | Status: DC
  Administered 2023-07-21 – 2023-07-24 (×6): 100 mg via ORAL
  Filled 2023-07-21 (×6): qty 1

## 2023-07-21 MED ORDER — DIPHENHYDRAMINE HCL 12.5 MG/5ML PO ELIX: 12.5000 mg | ORAL_SOLUTION | ORAL | Status: DC | PRN

## 2023-07-21 MED ORDER — POVIDONE-IODINE 10 % EX SWAB: 2.0000 | Freq: Once | CUTANEOUS | Status: DC

## 2023-07-21 MED ORDER — CHLORHEXIDINE GLUCONATE 4 % EX SOLN: 60.0000 mL | Freq: Once | CUTANEOUS | Status: DC

## 2023-07-21 MED ORDER — LIDOCAINE HCL (CARDIAC) PF 100 MG/5ML IV SOSY
PREFILLED_SYRINGE | INTRAVENOUS | Status: DC | PRN
  Administered 2023-07-21: 80 mg via INTRAVENOUS

## 2023-07-21 MED ORDER — ROCURONIUM BROMIDE 100 MG/10ML IV SOLN
INTRAVENOUS | Status: DC | PRN
  Administered 2023-07-21: 50 mg via INTRAVENOUS

## 2023-07-21 MED ORDER — CEFAZOLIN SODIUM-DEXTROSE 3-4 GM/150ML-% IV SOLN
3.0000 g | INTRAVENOUS | Status: AC
  Administered 2023-07-21: 3 g via INTRAVENOUS

## 2023-07-21 MED ORDER — ONDANSETRON HCL 4 MG/2ML IJ SOLN
INTRAMUSCULAR | Status: AC
  Filled 2023-07-21: qty 2

## 2023-07-21 MED ORDER — ACETAMINOPHEN 500 MG PO TABS: 1000.0000 mg | ORAL_TABLET | Freq: Four times a day (QID) | ORAL | Status: DC

## 2023-07-21 MED ORDER — ACETAMINOPHEN 500 MG PO TABS
1000.0000 mg | ORAL_TABLET | Freq: Once | ORAL | Status: AC
  Administered 2023-07-21: 1000 mg via ORAL

## 2023-07-21 SURGICAL SUPPLY — 66 items
BAG COUNTER SPONGE SURGICOUNT (BAG) ×1 IMPLANT
BAG ZIPLOCK 12X15 (MISCELLANEOUS) ×1 IMPLANT
BANDAGE ESMARK 6X9 LF (GAUZE/BANDAGES/DRESSINGS) ×1 IMPLANT
BIT DRILL 122X3.5XAO NS (BIT) IMPLANT
BIT DRILL 2.6X VARIAX 2 (BIT) IMPLANT
BIT DRILL SRG 2.7XCANN AO CPLG (BIT) IMPLANT
BIT DRL 122X3.5XAO NS (BIT) ×1 IMPLANT
BIT DRL 2.6X VARIAX 2 (BIT) ×1 IMPLANT
BIT DRL SRG 2.7XCANN AO CPLNG (BIT) ×1 IMPLANT
BLADE SURG 15 STRL LF DISP TIS (BLADE) ×1 IMPLANT
BNDG COHESIVE 6X5 TAN ST LF (GAUZE/BANDAGES/DRESSINGS) ×1 IMPLANT
BNDG ELASTIC 4INX 5YD STR LF (GAUZE/BANDAGES/DRESSINGS) ×1 IMPLANT
BNDG ELASTIC 6INX 5YD STR LF (GAUZE/BANDAGES/DRESSINGS) ×1 IMPLANT
BNDG ESMARK 6X9 LF (GAUZE/BANDAGES/DRESSINGS) ×1 IMPLANT
CHLORAPREP W/TINT 26 (MISCELLANEOUS) ×1 IMPLANT
CLSR STERI-STRIP ANTIMIC 1/2X4 (GAUZE/BANDAGES/DRESSINGS) IMPLANT
COVER MAYO STAND STRL (DRAPES) ×1 IMPLANT
COVER SURGICAL LIGHT HANDLE (MISCELLANEOUS) ×1 IMPLANT
CUFF TRNQT CYL 34X4.125X (TOURNIQUET CUFF) ×1 IMPLANT
DRAPE C-ARM 42X120 X-RAY (DRAPES) IMPLANT
DRAPE U-SHAPE 47X51 STRL (DRAPES) ×1 IMPLANT
DRSG EMULSION OIL 3X3 NADH (GAUZE/BANDAGES/DRESSINGS) IMPLANT
ELECT REM PT RETURN 15FT ADLT (MISCELLANEOUS) ×1 IMPLANT
GAUZE PAD ABD 8X10 STRL (GAUZE/BANDAGES/DRESSINGS) ×2 IMPLANT
GAUZE SPONGE 4X4 12PLY STRL (GAUZE/BANDAGES/DRESSINGS) ×1 IMPLANT
GLOVE BIO SURGEON STRL SZ7.5 (GLOVE) ×1 IMPLANT
GLOVE BIOGEL PI IND STRL 7.5 (GLOVE) ×1 IMPLANT
GLOVE BIOGEL PI IND STRL 8 (GLOVE) ×1 IMPLANT
GLOVE SURG SYN 7.0 (GLOVE) ×1 IMPLANT
GLOVE SURG SYN 7.0 PF PI (GLOVE) ×1 IMPLANT
GOWN STRL REUS W/ TWL LRG LVL3 (GOWN DISPOSABLE) ×1 IMPLANT
GOWN STRL REUS W/ TWL XL LVL3 (GOWN DISPOSABLE) ×1 IMPLANT
HEMOSTAT SURGICEL 4X8 (HEMOSTASIS) IMPLANT
K-WIRE FX150X1.25XNS LF SS (WIRE) ×1 IMPLANT
KIT BASIN OR (CUSTOM PROCEDURE TRAY) ×1 IMPLANT
KIT TURNOVER KIT A (KITS) IMPLANT
KWIRE FX150X1.25XNS LF SS (WIRE) IMPLANT
MANIFOLD NEPTUNE II (INSTRUMENTS) ×1 IMPLANT
NDL HYPO 22X1.5 SAFETY MO (MISCELLANEOUS) ×1 IMPLANT
NEEDLE HYPO 22X1.5 SAFETY MO (MISCELLANEOUS) ×1 IMPLANT
NS IRRIG 1000ML POUR BTL (IV SOLUTION) ×1 IMPLANT
PACK ORTHO EXTREMITY (CUSTOM PROCEDURE TRAY) ×1 IMPLANT
PAD CAST 4YDX4 CTTN HI CHSV (CAST SUPPLIES) ×1 IMPLANT
PADDING CAST ABS COTTON 6X4 NS (CAST SUPPLIES) IMPLANT
PADDING CAST COTTON 6X4 STRL (CAST SUPPLIES) ×1 IMPLANT
PLATE FIBULA 4H (Plate) IMPLANT
PROTECTOR NERVE ULNAR (MISCELLANEOUS) ×1 IMPLANT
SCREW BN T10 FT 14X3.5XSTRDR (Screw) IMPLANT
SCREW BONE 18X3.5 (Screw) IMPLANT
SCREW BONE 3.5X14 LOCKING (Screw) IMPLANT
SCREW BONE 3.5X16MM (Screw) IMPLANT
SCREW BONE THRD T10 3.5XL22 (Screw) IMPLANT
SCREW LOCK T10 FT 16X3.5X (Screw) IMPLANT
SPIKE FLUID TRANSFER (MISCELLANEOUS) IMPLANT
SUCTION TUBE FRAZIER 10FR DISP (SUCTIONS) ×1 IMPLANT
SUT ETHILON 3 0 PS 1 (SUTURE) IMPLANT
SUT MON AB 2-0 CT1 36 (SUTURE) IMPLANT
SUT MON AB 3-0 SH27 (SUTURE) IMPLANT
SUT VIC AB 0 CT1 36 (SUTURE) ×1 IMPLANT
SUT VIC AB 2-0 SH 27XBRD (SUTURE) ×1 IMPLANT
SUT VICRYL 0 27 CT2 27 ABS (SUTURE) IMPLANT
SYNDESMOSIS TIGHTROPE XP (Orthopedic Implant) IMPLANT
SYR CONTROL 10ML LL (SYRINGE) ×1 IMPLANT
TOWEL OR 17X26 10 PK STRL BLUE (TOWEL DISPOSABLE) ×1 IMPLANT
TUBING CONNECTING 10 (TUBING) ×1 IMPLANT
UNDERPAD 30X36 HEAVY ABSORB (UNDERPADS AND DIAPERS) ×1 IMPLANT

## 2023-07-21 NOTE — Interval H&P Note (Signed)
 History and Physical Interval Note:  07/21/2023 11:25 AM  Andrew Larsen  has presented today for surgery, with the diagnosis of LEFT ANKLE FRACTURE.  The various methods of treatment have been discussed with the patient and family. After consideration of risks, benefits and other options for treatment, the patient has consented to  Procedure(s): OPEN REDUCTION INTERNAL FIXATION (ORIF) ANKLE FRACTURE (Left) as a surgical intervention.  The patient's history has been reviewed, patient examined, no change in status, stable for surgery.  I have reviewed the patient's chart and labs.  Questions were answered to the patient's satisfaction.     Sheral Apley

## 2023-07-21 NOTE — Plan of Care (Signed)

## 2023-07-21 NOTE — Anesthesia Procedure Notes (Signed)
 Anesthesia Regional Block: Popliteal block   Pre-Anesthetic Checklist: , timeout performed,  Correct Patient, Correct Site, Correct Laterality,  Correct Procedure, Correct Position, site marked,  Risks and benefits discussed,  Surgical consent,  Pre-op evaluation,  At surgeon's request and post-op pain management  Laterality: Left  Prep: chloraprep       Needles:  Injection technique: Single-shot  Needle Type: Echogenic Stimulator Needle          Additional Needles:   Procedures:, nerve stimulator,,,,,     Nerve Stimulator or Paresthesia:  Response: plantar flexion of foot, 0.45 mA  Additional Responses:   Narrative:  Start time: 07/21/2023 11:10 AM End time: 07/21/2023 11:18 AM Injection made incrementally with aspirations every 5 mL.  Performed by: Personally  Anesthesiologist: Achille Rich, MD  Additional Notes: Functioning IV was confirmed and monitors were applied.  A 90mm 21ga Arrow echogenic stimulator needle was used. Sterile prep and drape,hand hygiene and sterile gloves were used.  Negative aspiration and negative test dose prior to incremental administration of local anesthetic. The patient tolerated the procedure well.  Ultrasound guidance: relevent anatomy identified, needle position confirmed, local anesthetic spread visualized around nerve(s), vascular puncture avoided.  Image printed for medical record.

## 2023-07-21 NOTE — Transfer of Care (Addendum)
 Immediate Anesthesia Transfer of Care Note  Patient: RAYDER SULLENGER  Procedure(s) Performed: OPEN REDUCTION INTERNAL FIXATION (ORIF) ANKLE FRACTURE (Left: Ankle)  Patient Location: PACU  Anesthesia Type:General  Level of Consciousness: awake and alert   Airway & Oxygen Therapy: Patient Spontanous Breathing and Patient connected to face mask oxygen  Post-op Assessment: Report given to RN and Post -op Vital signs reviewed and stable  Post vital signs: Reviewed and stable  Last Vitals:  Vitals Value Taken Time  BP 151/82 07/21/23 1309  Temp    Pulse 77 07/21/23 1312  Resp 19 07/21/23 1312  SpO2 100 % 07/21/23 1312  Vitals shown include unfiled device data.  Last Pain:  Vitals:   07/21/23 1014  TempSrc:   PainSc: 7          Complications: No notable events documented.

## 2023-07-21 NOTE — Anesthesia Procedure Notes (Signed)
 Anesthesia Regional Block: Adductor canal block   Pre-Anesthetic Checklist: , timeout performed,  Correct Patient, Correct Site, Correct Laterality,  Correct Procedure, Correct Position, site marked,  Risks and benefits discussed,  Surgical consent,  Pre-op evaluation,  At surgeon's request and post-op pain management  Laterality: Left  Prep: chloraprep       Needles:  Injection technique: Single-shot  Needle Type: Echogenic Needle     Needle Length: 9cm  Needle Gauge: 21     Additional Needles:   Narrative:  Start time: 07/21/2023 11:14 AM End time: 07/21/2023 11:19 AM Injection made incrementally with aspirations every 5 mL.  Performed by: Personally  Anesthesiologist: Achille Rich, MD  Additional Notes: Pt tolerated the procedure well.

## 2023-07-21 NOTE — Op Note (Signed)
 07/20/2023 - 07/21/2023  12:38 PM  PATIENT:  Andrew Larsen    PRE-OPERATIVE DIAGNOSIS:  LEFT ANKLE FRACTURE  POST-OPERATIVE DIAGNOSIS:  Same  PROCEDURE:  OPEN REDUCTION INTERNAL FIXATION (ORIF) ANKLE FRACTURE  SURGEON:  Sheral Apley, MD  ASSISTANT: Levester Fresh, PA-C, he was present and scrubbed throughout the case, critical for completion in a timely fashion, and for retraction, instrumentation, and closure.   ANESTHESIA:   gn   PREOPERATIVE INDICATIONS:  Andrew Larsen is a  83 y.o. male with a diagnosis of LEFT ANKLE FRACTURE who failed conservative measures and elected for surgical management.    The risks benefits and alternatives were discussed with the patient preoperatively including but not limited to the risks of infection, bleeding, nerve injury, cardiopulmonary complications, the need for revision surgery, among others, and the patient was willing to proceed.  OPERATIVE IMPLANTS: stryker plate, tightrope  OPERATIVE FINDINGS: Unstable ankle fracture. Stable syndesmosis post op  BLOOD LOSS: min  COMPLICATIONS: none  TOURNIQUET TIME: 45  OPERATIVE PROCEDURE:  Patient was identified in the preoperative holding area and site was marked by me He was transported to the operating theater and placed on the table in supine position taking care to pad all bony prominences. After a preincinduction time out anesthesia was induced. The left lower extremity was prepped and draped in normal sterile fashion and a pre-incision timeout was performed. Andrew Larsen received ancef for preoperative antibiotics.   I made a lateral incision of roughly 7 cm dissection was carried down sharply to the distal fibula and then spreading dissection was used proximally to protect the superficial peroneal nerve. I sharply incised the periosteum and took care to protect the peroneal tendons. I then debrided the fracture site and performed a reduction maneuver which was held in place with a clamp.    I placed a lag screw across the fracture  I then selected a locking plate and placed in a neutralization fashion care was taken distally so as not to penetrate the joint with the cancellus screws.  I then turned my attention medially where I created a 4 cm incision and dissected sharply down to the medial Mal fracture taking care to protect the saphenous vein. I debrided the fracture and reduced and held in place with a tenaculum. I then drilled and placed 1 partially threaded 50 mm cannulated screw across the fracture.   I then stressed the syndesmosis and it was unstable for syndesmotic fixation I performed a reduction maneuver with a and placed a tightrope  I assesed the posterior mal piece and it was small enough to not require fixation as it involved less than 20% of the articular surface  The wound was then thoroughly irrigated and closed using a 0 Vicryl and absorbable Monocryl sutures. He was placed in a short leg splint.   POST OPERATIVE PLAN: Non-weightbearing. DVT prophylaxis will consist of mobilization and chemical px

## 2023-07-21 NOTE — Anesthesia Procedure Notes (Signed)

## 2023-07-21 NOTE — Progress Notes (Signed)
   07/21/23 2055  BiPAP/CPAP/SIPAP  BiPAP/CPAP/SIPAP Pt Type Adult (Pt prefer self placement machine all ready to be used)  BiPAP/CPAP/SIPAP DREAMSTATIOND  Mask Type Full face mask  Dentures removed? Not applicable  Mask Size Medium  FiO2 (%) 21 %  Patient Home Equipment No  Auto Titrate Yes (6/20)  CPAP/SIPAP surface wiped down Yes

## 2023-07-21 NOTE — Discharge Instructions (Signed)

## 2023-07-21 NOTE — Anesthesia Preprocedure Evaluation (Signed)
 Anesthesia Evaluation  Patient identified by MRN, date of birth, ID band Patient awake    Reviewed: Allergy & Precautions, H&P , NPO status , Patient's Chart, lab work & pertinent test results  Airway Mallampati: II   Neck ROM: full    Dental   Pulmonary asthma , sleep apnea , former smoker   breath sounds clear to auscultation       Cardiovascular hypertension, + Peripheral Vascular Disease   Rhythm:regular Rate:Normal     Neuro/Psych    GI/Hepatic ,GERD  ,,  Endo/Other  diabetes, Type 2  Class 3 obesity  Renal/GU Renal InsufficiencyRenal disease     Musculoskeletal  (+) Arthritis ,    Abdominal   Peds  Hematology   Anesthesia Other Findings   Reproductive/Obstetrics                             Anesthesia Physical Anesthesia Plan  ASA: 3  Anesthesia Plan: General   Post-op Pain Management: Regional block*   Induction: Intravenous  PONV Risk Score and Plan: 2 and Ondansetron, Dexamethasone and Treatment may vary due to age or medical condition  Airway Management Planned: Oral ETT  Additional Equipment:   Intra-op Plan:   Post-operative Plan: Extubation in OR  Informed Consent: I have reviewed the patients History and Physical, chart, labs and discussed the procedure including the risks, benefits and alternatives for the proposed anesthesia with the patient or authorized representative who has indicated his/her understanding and acceptance.     Dental advisory given  Plan Discussed with: CRNA, Anesthesiologist and Surgeon  Anesthesia Plan Comments:        Anesthesia Quick Evaluation

## 2023-07-21 NOTE — Progress Notes (Signed)
 Unable to reassess pain due to pt taken to PACU.

## 2023-07-21 NOTE — Progress Notes (Signed)
  Progress Note   Patient: Andrew Larsen ZOX:[id number] DOB: 12-24-1940 DOA: 07/20/2023     1 DOS: the patient was seen and examined on 07/21/2023   Brief hospital course:   Assessment and Plan:  # Unstable left ankle fracture: stage IV Weber B fracture.  Seen and evaluated by orthopedics.  Patient is being planned for ORIF today 03/23.  We will continue to optimize pain management..  Patient is currently nonweightbearing status. Agree with holding Plavix till after surgery.  Orthopedics to advise when to resume. Resume PT and OT per orthopedics recommendations.  #History of inclusion body myositis: Follows up with [facility].  Patient reports that contributes to his unstable gait  # Peripheral arterial disease: Plavix on hold at this time due to pending procedure.  Continue with Crestor and Zetia.  Stable ABI on last cardiology visit.  #Chronic lower extremity edema: Likely lymphedema.  Echocardiogram  from 2021 revealed preserved left ventricular ejection fraction of 60 to 65%.  Echo ordered and pending for today  #Type 2 diabetes mellitus: A1c on this visit was 7.3.  Continue with insulin sliding scale per protocol.  Fingerstick glucose monitoring.  Unit protocol.  #Hypertension Blood pressures ranging in the 130s-160s/60s-80s.On metoprolol tartrate 25 mg twice daily and losartan 25 mg daily.   #CKD 3a- Stable Baseline creatinine around 1.2-1.4.   Avoid nephrotoxic medication   #OSA -Resume home CPAP nightly  #BPH-continue Flomax  #History of stage I prostate cancer status post radiation therapy: Follows up with urology       Subjective: Patient is in a good mood.  He is awaiting transfer to the OR later this morning for procedure.  Denies any significant pain.  Admits prior history of tib-fib fracture on the right lower extremity.  No prior history of coronary disease or recent chest pain symptoms.  Physical Exam: Vitals:   07/21/23 1315 07/21/23 1330 07/21/23 1346 07/21/23  1412  BP: (!) 140/79 128/75 125/72 135/78  Pulse: 70 70 67 72  Resp: 12 15 18 18   Temp:   (!) 97 F (36.1 C) (!) 97.4 F (36.3 C)  TempSrc:    Oral  SpO2: 99% 99% 99% 100%  Weight:      Height:       General: Obese gentleman, alert oriented x 3.  Not in any acute distress. HEENT: Mucosa moist, Neck: Supple Chest: Clinically clear anteriorly.  Diminished at the base of the lungs. Cardiovascular: S1-S2 with no murmur. Extremities single for left lower extremity with dressing in place. Skin: Negative for any new rash. Data Reviewed: Glucose is 119, sodium is 137, potassium 3.9, chloride 102, bicarb 27, glucose 160, BUN 20, creatinine 1.35, WBC 7.2, hemoglobin 13, hematocrit 40, platelet count 152, A1c 7.3  CT scan findings with radiologist IMPRESSION: 1. Stage 4 Weber B fracture dislocation of the ankle, with oblique lateral malleolar component, transverse medial malleolar component, and a small oblique posterior malleolar component. There is about 2 mm distraction at the lateral malleolar component and some minimal comminution distally at the lateral malleolus. The medial malleolar and posterior malleolar components are nondisplaced. Currently tibiotalar alignment is normal. 2. Circumferential subcutaneous edema in the ankle, tracking into the dorsum of the foot. 3. Atheromatous vascular calcifications  Family Communication:   Disposition: To be determined Status is: Inpatient Remains inpatient appropriate because: ORIF  Planned Discharge Destination: Rehab    Time spent: 30 minutes  Author: Lilia Pro, MD 07/21/2023 2:24 PM  For on call review [email or web address].

## 2023-07-22 ENCOUNTER — Inpatient Hospital Stay (HOSPITAL_COMMUNITY)

## 2023-07-22 ENCOUNTER — Encounter (HOSPITAL_COMMUNITY): Payer: Self-pay | Admitting: Orthopedic Surgery

## 2023-07-22 DIAGNOSIS — I509 Heart failure, unspecified: Secondary | ICD-10-CM | POA: Diagnosis not present

## 2023-07-22 DIAGNOSIS — R600 Localized edema: Secondary | ICD-10-CM

## 2023-07-22 DIAGNOSIS — S82842A Displaced bimalleolar fracture of left lower leg, initial encounter for closed fracture: Secondary | ICD-10-CM | POA: Diagnosis not present

## 2023-07-22 LAB — BASIC METABOLIC PANEL
Anion gap: 8 (ref 5–15)
BUN: 24 mg/dL — ABNORMAL HIGH (ref 8–23)
CO2: 25 mmol/L (ref 22–32)
Calcium: 8.4 mg/dL — ABNORMAL LOW (ref 8.9–10.3)
Chloride: 99 mmol/L (ref 98–111)
Creatinine, Ser: 0.98 mg/dL (ref 0.61–1.24)
GFR, Estimated: >60 mL/min (ref >60)
Glucose, Bld: 184 mg/dL — ABNORMAL HIGH (ref 70–99)
Potassium: 4.3 mmol/L (ref 3.5–5.1)
Sodium: 132 mmol/L — ABNORMAL LOW (ref 135–145)

## 2023-07-22 LAB — GLUCOSE, CAPILLARY
Glucose-Capillary: 157 mg/dL — ABNORMAL HIGH (ref 70–99)
Glucose-Capillary: 177 mg/dL — ABNORMAL HIGH (ref 70–99)
Glucose-Capillary: 213 mg/dL — ABNORMAL HIGH (ref 70–99)
Glucose-Capillary: 186 mg/dL — ABNORMAL HIGH (ref 70–99)

## 2023-07-22 LAB — ECHOCARDIOGRAM COMPLETE
AV Peak grad: 5.9 mmHg
Ao pk vel: 1.21 m/s
Area-P 1/2: 2.1 cm2
Height: 71 in
S' Lateral: 2.2 cm
Weight: 4377.45 [oz_av]

## 2023-07-22 LAB — CBC
HCT: 39.6 % (ref 39.0–52.0)
Hemoglobin: 13.3 g/dL (ref 13.0–17.0)
MCH: 31.8 pg (ref 26.0–34.0)
MCHC: 33.6 g/dL (ref 30.0–36.0)
MCV: 94.7 fL (ref 80.0–100.0)
Platelets: 166 10*3/uL (ref 150–400)
RBC: 4.18 MIL/uL — ABNORMAL LOW (ref 4.22–5.81)
RDW: 13.3 % (ref 11.5–15.5)
WBC: 8.9 10*3/uL (ref 4.0–10.5)
nRBC: 0 % (ref 0.0–0.2)

## 2023-07-22 MED ORDER — ASPIRIN 81 MG PO TBEC
81.0000 mg | DELAYED_RELEASE_TABLET | Freq: Two times a day (BID) | ORAL | Status: DC
  Administered 2023-07-22 – 2023-07-24 (×3): 81 mg via ORAL
  Filled 2023-07-22 (×4): qty 1

## 2023-07-22 MED ORDER — POLYETHYLENE GLYCOL 3350 17 G PO PACK
17.0000 g | PACK | Freq: Every day | ORAL | Status: DC
  Administered 2023-07-22 – 2023-07-23 (×2): 17 g via ORAL
  Filled 2023-07-22 (×3): qty 1

## 2023-07-22 MED ORDER — PERFLUTREN LIPID MICROSPHERE
1.0000 mL | INTRAVENOUS | Status: AC | PRN
  Administered 2023-07-22: 2 mL via INTRAVENOUS

## 2023-07-22 NOTE — Progress Notes (Signed)
   07/22/23 2258  BiPAP/CPAP/SIPAP  BiPAP/CPAP/SIPAP Pt Type Adult (machine plugged into red outlet)  BiPAP/CPAP/SIPAP DREAMSTATIOND  Mask Type Full face mask  Dentures removed? Not applicable  Mask Size Medium  FiO2 (%) 21 %  Patient Home Equipment No  Auto Titrate Yes (Auto Mode 6-20 cm H2O)  CPAP/SIPAP surface wiped down Yes

## 2023-07-22 NOTE — Plan of Care (Signed)

## 2023-07-22 NOTE — NC FL2 (Signed)
 [place] MEDICAID FL2 LEVEL OF CARE FORM     IDENTIFICATION  Patient Name: Andrew Larsen Birthdate: 1940/05/06 Sex: male Admission Date (Current Location): 07/20/2023  [facility] and [place] Number:  Producer, television/film/video and Address:  [facility], [address] [place], [place] [address]      Provider Number: [id number]  Attending Physician Name and Address:  Kathrynn Running, MD  Relative Name and Phone Number:       Current Level of Care: Hospital Recommended Level of Care: Skilled Nursing Facility Prior Approval Number:    Date Approved/Denied:   PASRR Number: [id number]  Discharge Plan: SNF    Current Diagnoses: Patient Active Problem List   Diagnosis Date Noted   Bimalleolar ankle fracture, left, closed, initial encounter 07/20/2023   Malignant neoplasm of prostate (HCC) 11/04/2022   Sepsis secondary to UTI (HCC) 09/06/2021   Complicated UTI (urinary tract infection) 09/06/2021   Severe sepsis (HCC) 09/01/2021   Cough 01/07/2019   Labile blood glucose    SOB (shortness of breath)    Urinary frequency    Hypoalbuminemia due to protein-calorie malnutrition (HCC)    Supplemental oxygen dependent    Controlled type 2 diabetes mellitus with hyperglycemia (HCC)    Stage 3a chronic kidney disease (CKD) (HCC)    Benign essential HTN    Physical debility 12/01/2018   Pressure injury of skin 11/27/2018   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   Chronic kidney disease, stage 3b (HCC) 11/03/2018   Type II diabetes mellitus with stage 3 chronic kidney disease (HCC) 11/03/2018   Essential hypertension 11/03/2018   Claudication in peripheral vascular disease (HCC) 06/10/2016   Morbid obesity (HCC) 09/21/2015   Upper airway cough syndrome 09/20/2015   Cough variant asthma 09/20/2015    Orientation RESPIRATION BLADDER Height & Weight     Self, Time, Situation, Place  Normal Continent Weight: 124.1 kg Height:  5\' 11"  (180.3 cm)  BEHAVIORAL  SYMPTOMS/MOOD NEUROLOGICAL BOWEL NUTRITION STATUS      Continent Diet  AMBULATORY STATUS COMMUNICATION OF NEEDS Skin   Limited Assist Verbally Normal, Surgical wounds                       Personal Care Assistance Level of Assistance  Bathing, Dressing Bathing Assistance: Limited assistance   Dressing Assistance: Limited assistance     Functional Limitations Info             SPECIAL CARE FACTORS FREQUENCY  PT (By licensed PT), OT (By licensed OT)     PT Frequency: 5X weekly OT Frequency: 5X weekly            Contractures Contractures Info: Not present    Additional Factors Info  Code Status, Allergies, Insulin Sliding Scale Code Status Info: FULL Allergies Info: Metformin And Related, Pollen Extract, Simbrinza (Brinzolamide-brimonidine)   Insulin Sliding Scale Info: insulin aspart (novoLOG) injection 0-15 Units 0-15 Units, Subcutaneous, 3 times daily with meals, First dose on Sat 07/20/23 at 1700  Correction coverage: Moderate (average weight, post-op)  CBG < 70: Implement Hypoglycemia Standing Orders and refer to Hypoglycemia Standing Orders sidebar report  CBG 70 - 120: 0 units  CBG 121 - 150: 2 units  CBG 151 - 200: 3 units  CBG 201 - 250: 5 units  CBG 251 - 300: 8 units  CBG 301 - 350: 11 units  CBG 351 - 400: 15 units       Current Medications (07/22/2023):  This is the current hospital active medication list Current Facility-Administered Medications  Medication Dose Route Frequency Provider Last Rate Last Admin   acetaminophen (TYLENOL) tablet 1,000 mg  1,000 mg Oral Q6H Acheampong, Genice Rouge, MD   1,000 mg at 07/22/23 0542   acetaminophen (TYLENOL) tablet 325-650 mg  325-650 mg Oral Q6H PRN Gawne, Meghan M, PA-C       clopidogrel (PLAVIX) tablet 75 mg  75 mg Oral Daily Norva Pavlov, RPH   75 mg at 07/22/23 0930   diphenhydrAMINE (BENADRYL) 12.5 MG/5ML elixir 12.5-25 mg  12.5-25 mg Oral Q4H PRN Gawne, Meghan M, PA-C       docusate sodium (COLACE) capsule  100 mg  100 mg Oral BID Gawne, Meghan M, PA-C   100 mg at 07/22/23 0931   dorzolamide (TRUSOPT) 2 % ophthalmic solution 1 drop  1 drop Right Eye BID Levester Fresh M, PA-C   1 drop at 07/22/23 0932   ezetimibe (ZETIA) tablet 10 mg  10 mg Oral Daily Levester Fresh M, PA-C   10 mg at 07/22/23 0930   gabapentin (NEURONTIN) capsule 600 mg  600 mg Oral TID Jenne Pane, PA-C   600 mg at 07/22/23 1610   HYDROmorphone (DILAUDID) injection 0.5-1 mg  0.5-1 mg Intravenous Q4H PRN Jenne Pane, PA-C   1 mg at 07/21/23 1829   insulin aspart (novoLOG) injection 0-15 Units  0-15 Units Subcutaneous TID WC Jenne Pane, PA-C   3 Units at 07/22/23 0900   insulin glargine (LANTUS) injection 20 Units  20 Units Subcutaneous QHS Levester Fresh M, PA-C   20 Units at 07/21/23 2226   latanoprost (XALATAN) 0.005 % ophthalmic solution 1 drop  1 drop Both Eyes QHS Levester Fresh M, PA-C   1 drop at 07/21/23 2222   losartan (COZAAR) tablet 25 mg  25 mg Oral Daily Levester Fresh M, PA-C   25 mg at 07/22/23 9604   methocarbamol (ROBAXIN) tablet 500 mg  500 mg Oral Q6H PRN Gawne, Meghan M, PA-C       Or   methocarbamol (ROBAXIN) injection 500 mg  500 mg Intravenous Q6H PRN Gawne, Meghan M, PA-C       metoCLOPramide (REGLAN) tablet 5-10 mg  5-10 mg Oral Q8H PRN Gawne, Meghan M, PA-C       Or   metoCLOPramide (REGLAN) injection 5-10 mg  5-10 mg Intravenous Q8H PRN Gawne, Meghan M, PA-C       metoprolol tartrate (LOPRESSOR) tablet 25 mg  25 mg Oral BID Levester Fresh M, PA-C   25 mg at 07/22/23 0931   ondansetron (ZOFRAN) tablet 4 mg  4 mg Oral Q6H PRN Levester Fresh M, PA-C       Or   ondansetron [facility]) injection 4 mg  4 mg Intravenous Q6H PRN Gawne, Meghan M, PA-C       oxyCODONE (Oxy IR/ROXICODONE) immediate release tablet 10 mg  10 mg Oral Q4H PRN Gawne, Meghan M, PA-C       oxyCODONE (Oxy IR/ROXICODONE) immediate release tablet 5 mg  5 mg Oral Q4H PRN Levester Fresh M, PA-C   5 mg at 07/22/23 0546   pantoprazole  (PROTONIX) EC tablet 40 mg  40 mg Oral Daily Levester Fresh M, PA-C   40 mg at 07/22/23 5409   perflutren lipid microspheres (DEFINITY) IV suspension  1-10 mL Intravenous PRN Briscoe Burns, MD   2 mL at 07/22/23 1516   rosuvastatin (CRESTOR) tablet 10 mg  10  mg Oral Daily Levester Fresh M, [place]   10 mg at 07/22/23 1610   senna-docusate (Senokot-S) tablet 1 tablet  1 tablet Oral QHS PRN Levester Fresh M, PA-C       tamsulosin [facility]) capsule 0.4 mg  0.4 mg Oral Daily Levester Fresh M, PA-C   0.4 mg at 07/22/23 0931   timolol (TIMOPTIC) 0.5 % ophthalmic solution 1 drop  1 drop Right Eye BID Levester Fresh M, PA-C   1 drop at 07/22/23 9604     Discharge Medications: Please see discharge summary for a list of discharge medications.  Relevant Imaging Results:  Relevant Lab Results:   Additional Information SS#  [id number]  Jessie Foot, RN

## 2023-07-22 NOTE — Evaluation (Signed)
 Physical Therapy Evaluation Patient Details Name: Andrew Larsen MRN: [id number] DOB: 12-03-1940 Today's Date: 07/22/2023  History of Present Illness  Andrew Larsen is an 83 yo male s/p L ankle ORIF 3/23 after a fall at home. PMH: CKD, diabetes, GERD, diabetes, glaucoma, HTN, prostate CA, R ankle ORIF 10/02/12  Clinical Impression  Pt admitted with above diagnosis. At baseline, pt reports ind with RW or rollator for in home amb and limited community, ind with self care, lives with daughter who works from home. Pt reports 4 steps to enter the home, then able to live on main level and does have DME at home. On eval, pt unable to fully stand with +2 A, reports fear of falling, appears hesitant to attempt powering to stand. Heavy education regarding NWB LLE, somewhat impulsive to laterally scoot transfer to recliner, attempting prior to therapist and tech in place, quickly scoots forward once seated EOB needing cues to prevent scooting too far forward. Attempted STS from recliner, educated pt on pushing from seated surface and NWB LLE, pt fully extends arms on recliner armrests and appears to rest NWB LLE but returns to sitting, reports fear of falling and placing weight on L leg. Patient will benefit from continued inpatient follow up therapy, <3 hours/day. Pt currently with functional limitations due to the deficits listed below (see PT Problem List). Pt will benefit from acute skilled PT to increase their independence and safety with mobility to allow discharge.           If plan is discharge home, recommend the following: Two people to help with walking and/or transfers;Two people to help with bathing/dressing/bathroom;Assistance with cooking/housework;Assist for transportation;Help with stairs or ramp for entrance   Can travel by private vehicle   No    Equipment Recommendations None recommended by PT  Recommendations for Other Services       Functional Status Assessment Patient has had a  recent decline in their functional status and demonstrates the ability to make significant improvements in function in a reasonable and predictable amount of time.     Precautions / Restrictions Precautions Precautions: Fall Restrictions Weight Bearing Restrictions Per Provider Order: Yes LLE Weight Bearing Per Provider Order: Non weight bearing      Mobility  Bed Mobility Overal bed mobility: Needs Assistance Bed Mobility: Supine to Sit     Supine to sit: Contact guard, HOB elevated, Used rails     General bed mobility comments: heavy use of BUE on bedrails with elevated HOB, increased time and effort, verbal cues for LLE NWB management with mobilizng to EOB and once seated EOB to avoid pushing down into foot    Transfers Overall transfer level: Needs assistance Equipment used: 2 person hand held assist Transfers: Bed to chair/wheelchair/BSC            Lateral/Scoot Transfers: Min assist, +2 physical assistance, +2 safety/equipment General transfer comment: pt declines to STS from EOB due to fear of falling and inability to maintain LLE NWB, able to perform lateral scoot transfer bed to recliner at bedside, strong RLE and BUE assisting to scoot, min A+2 with max verbal cues for NWB LLE and assist to perform safely; once in recliner, attempted STS but unable to power to stand with BUE assisting and +2 A, pt reports fear of falling in single limb stance with RW and +2 A    Ambulation/Gait                  Stairs  Wheelchair Mobility     Tilt Bed    Modified Rankin (Stroke Patients Only)       Balance Overall balance assessment: Needs assistance                                           Pertinent Vitals/Pain Pain Assessment Pain Assessment: 0-10 Pain Score: 3  Pain Location: L ankle Pain Descriptors / Indicators: Grimacing, Guarding Pain Intervention(s): Limited activity within patient's tolerance, Monitored during  session, Repositioned, Premedicated before session    Home Living Family/patient expects to be discharged to:: Private residence Living Arrangements: Children Available Help at Discharge: Family Type of Home: House Home Access: Stairs to enter Entrance Stairs-Rails: Doctor, general practice of Steps: 4   Home Layout: Two level;Able to live on main level with bedroom/bathroom Home Equipment: Rolling Walker (2 wheels);Rollator (4 wheels);Wheelchair - manual;Tub bench      Prior Function Prior Level of Function : Independent/Modified Independent;History of Falls (last six months)             Mobility Comments: pt reports using RW or rollator for mobility in the home and limited community distances ADLs Comments: pt reports ind wtih self care, daughter completes household chores     Extremity/Trunk Assessment   Upper Extremity Assessment Upper Extremity Assessment: Overall WFL for tasks assessed    Lower Extremity Assessment Lower Extremity Assessment: LLE deficits/detail;RLE deficits/detail RLE Deficits / Details: AROM WFL RLE Sensation: WNL;history of peripheral neuropathy RLE Coordination: WNL LLE Deficits / Details: able to wiggle toes, can perform quad set, denies numbness/tingling, reports history of peripheral neuropathy LLE Sensation: history of peripheral neuropathy       Communication   Communication Communication: No apparent difficulties    Cognition Arousal: Alert Behavior During Therapy: WFL for tasks assessed/performed, Impulsive   PT - Cognitive impairments: No apparent impairments                       PT - Cognition Comments: heavy verbal cues for NWB LLE and pt able to state them correctly, heavy verbal cues and education with questionable adherance, attempting to mobilize without proper DME in place despite heavy education and cues Following commands: Intact       Cueing Cueing Techniques: Verbal cues, Tactile cues, Visual  cues     General Comments      Exercises     Assessment/Plan    PT Assessment Patient needs continued PT services  PT Problem List Decreased strength;Decreased range of motion;Decreased activity tolerance;Decreased balance;Decreased mobility;Decreased knowledge of use of DME;Decreased safety awareness;Decreased knowledge of precautions;Impaired sensation;Pain;Obesity       PT Treatment Interventions DME instruction;Gait training;Functional mobility training;Therapeutic activities;Therapeutic exercise;Balance training;Cognitive remediation;Patient/family education;Wheelchair mobility training;Manual techniques    PT Goals (Current goals can be found in the Care Plan section)  Acute Rehab PT Goals Patient Stated Goal: regain strength and independence PT Goal Formulation: With patient Time For Goal Achievement: 08/05/23 Potential to Achieve Goals: Good    Frequency Min 2X/week     Co-evaluation               AM-PAC PT "6 Clicks" Mobility  Outcome Measure Help needed turning from your back to your side while in a flat bed without using bedrails?: A Little Help needed moving from lying on your back to sitting on the side of a flat bed without  using bedrails?: A Little Help needed moving to and from a bed to a chair (including a wheelchair)?: Total Help needed standing up from a chair using your arms (e.g., wheelchair or bedside chair)?: Total Help needed to walk in hospital room?: Total Help needed climbing 3-5 steps with a railing? : Total 6 Click Score: 10    End of Session Equipment Utilized During Treatment: Gait belt Activity Tolerance: Patient tolerated treatment well Patient left: in chair;with call bell/phone within reach Nurse Communication: Mobility status PT Visit Diagnosis: Unsteadiness on feet (R26.81);Other abnormalities of gait and mobility (R26.89);Muscle weakness (generalized) (M62.81);Pain Pain - Right/Left: Left Pain - part of body: Ankle and joints  of foot    Time: 1211-1230 PT Time Calculation (min) (ACUTE ONLY): 19 min   Charges:   PT Evaluation $PT Eval Moderate Complexity: 1 Mod   PT General Charges $$ ACUTE PT VISIT: 1 Visit         Tori Gavynn Duvall PT, DPT 07/22/23, 1:08 PM

## 2023-07-22 NOTE — Progress Notes (Signed)
 CPAP order is PRN.  Not needed at this time.

## 2023-07-22 NOTE — Progress Notes (Signed)
 Physical Therapy Treatment Patient Details Name: Andrew Larsen MRN: [id number] DOB: [birth year] Today's Date: 07/22/2023   History of Present Illness [name] is an 83 yo male s/p L ankle ORIF 3/23 after a fall at home. PMH: CKD, diabetes, GERD, diabetes, glaucoma, HTN, prostate CA, R ankle ORIF 10/02/12    PT Comments  Nursing requesting assist to transfer pt back to bed for ECHO. Heavy education regarding NWB LLE, pt continues to report fearful of falling, self directed in transfer technique needing cues for sequencing and mobility. Pt needing mod A+2 to laterally scoot from recliner back to bed, heavy cues for BUE and RLE weightbearing to assist in mobilizing. Pt needing min A to lift LLE back into bed, then able to use bedrails and headboard with bed in boost position to scoot up in bed. Pt left in supine with ECHO at bedside.   If plan is discharge home, recommend the following: Two people to help with walking and/or transfers;Two people to help with bathing/dressing/bathroom;Assistance with cooking/housework;Assist for transportation;Help with stairs or ramp for entrance   Can travel by private vehicle     No  Equipment Recommendations  None recommended by PT    Recommendations for Other Services       Precautions / Restrictions Precautions Precautions: Fall Restrictions Weight Bearing Restrictions Per Provider Order: Yes LLE Weight Bearing Per Provider Order: Non weight bearing     Mobility  Bed Mobility Overal bed mobility: Needs Assistance Bed Mobility: Sit to Supine     Supine to sit: Contact guard, HOB elevated, Used rails Sit to supine: Min assist, HOB elevated, Used rails   General bed mobility comments: min A to lift LLE back into bed, pt using bedrails to slide up in bed wtih bed in boost position    Transfers Overall transfer level: Needs assistance Equipment used: 2 person hand held assist Transfers: Bed to chair/wheelchair/BSC             Lateral/Scoot Transfers: Mod assist, +2 physical assistance, +2 safety/equipment General transfer comment: pt in low seated recliner, heavy education regarding NWB LLE and sequencing to slide from drop arm recliner to bed, increased time and effort, strong cues for BUE use to assist in sliding, mod A+2 for physical assistance and safety    Ambulation/Gait                   Stairs             Wheelchair Mobility     Tilt Bed    Modified Rankin (Stroke Patients Only)       Balance Overall balance assessment: Needs assistance, History of Falls Sitting-balance support: Feet supported Sitting balance-Leahy Scale: Fair                                      Hotel manager: No apparent difficulties  Cognition Arousal: Alert Behavior During Therapy: WFL for tasks assessed/performed, Impulsive   PT - Cognitive impairments: No apparent impairments                       PT - Cognition Comments: verbal cues for NWB LLE and sequencing to maintain precautions Following commands: Intact      Cueing Cueing Techniques: Verbal cues, Tactile cues, Visual cues  Exercises      General Comments        Pertinent Vitals/Pain  Pain Assessment Pain Assessment: Faces Pain Score: 3  Faces Pain Scale: Hurts little more Pain Location: L ankle Pain Descriptors / Indicators: Grimacing, Guarding Pain Intervention(s): Limited activity within patient's tolerance, Monitored during session, Repositioned    Home Living Family/patient expects to be discharged to:: Private residence Living Arrangements: Children Available Help at Discharge: Family Type of Home: House Home Access: Stairs to enter Entrance Stairs-Rails: Doctor, general practice of Steps: 4   Home Layout: Two level;Able to live on main level with bedroom/bathroom Home Equipment: Rolling Walker (2 wheels);Rollator (4 wheels);Wheelchair - manual;Tub bench       Prior Function            PT Goals (current goals can now be found in the care plan section) Acute Rehab PT Goals Patient Stated Goal: regain strength and independence PT Goal Formulation: With patient Time For Goal Achievement: 08/05/23 Potential to Achieve Goals: Good Progress towards PT goals: Progressing toward goals    Frequency    Min 2X/week      PT Plan      Co-evaluation              AM-PAC PT "6 Clicks" Mobility   Outcome Measure  Help needed turning from your back to your side while in a flat bed without using bedrails?: A Little Help needed moving from lying on your back to sitting on the side of a flat bed without using bedrails?: A Little Help needed moving to and from a bed to a chair (including a wheelchair)?: Total Help needed standing up from a chair using your arms (e.g., wheelchair or bedside chair)?: Total Help needed to walk in hospital room?: Total Help needed climbing 3-5 steps with a railing? : Total 6 Click Score: 10    End of Session Equipment Utilized During Treatment: Gait belt Activity Tolerance: Patient tolerated treatment well Patient left: in bed;with call bell/phone within reach;Other (comment) (ECHO in room) Nurse Communication: Mobility status PT Visit Diagnosis: Unsteadiness on feet (R26.81);Other abnormalities of gait and mobility (R26.89);Muscle weakness (generalized) (M62.81);Pain Pain - Right/Left: Left Pain - part of body: Ankle and joints of foot     Time: 1420-1439 PT Time Calculation (min) (ACUTE ONLY): 19 min  Charges:    $Therapeutic Activity: 8-22 mins PT General Charges $$ ACUTE PT VISIT: 1 Visit                     Tori Jaevin Medearis PT, DPT 07/22/23, 3:47 PM

## 2023-07-22 NOTE — TOC Initial Note (Signed)
 Transition of Care [facility]) - Initial/Assessment Note    Patient Details  Name: Andrew Larsen MRN: [id number] Date of Birth: 1940-12-08  Transition of Care [facility]) CM/SW Contact:    Andrew Foot, RN Phone Number: 07/22/2023, 2:52 PM  Clinical Narrative:                 Presented for LOWER EXTREMITY AND HUMERUS PROCEDURES from home. Lives in a single family home with Andrew Larsen (daughter) and son in-law. PTA was independent and stilling driving. DME in the home walker, rollator [place], WC. Patient denies active with HH.?He is in agreement with MD and therapy at this point in recommending SNF rehab. Will begin PASSR, & FL2 to start bed search.  Expected Discharge Plan: Skilled Nursing Facility Barriers to Discharge: Continued Medical Work up   Patient Goals and CMS Choice Patient states their goals for this hospitalization and ongoing recovery are:: Go to SNF than home          Expected Discharge Plan and Services   Discharge Planning Services: CM Consult   Living arrangements for the past 2 months: Single Family Home                                      Prior Living Arrangements/Services Living arrangements for the past 2 months: Single Family Home Lives with:: Adult Children Patient language and need for interpreter reviewed:: Yes Do you feel safe going back to the place where you live?: Yes      Need for Family Participation in Patient Care: No (Comment) Care giver support system in place?: Yes (comment)   Criminal Activity/Legal Involvement Pertinent to Current Situation/Hospitalization: No - Comment as needed  Activities of Daily Living   ADL Screening (condition at time of admission) Independently performs ADLs?: Yes (appropriate for developmental age) Is the patient deaf or have difficulty hearing?: Yes Does the patient have difficulty seeing, even when wearing glasses/contacts?: No Does the patient have difficulty concentrating, remembering, or making  decisions?: No  Permission Sought/Granted Permission sought to share information with : Case Manager, Magazine features editor Permission granted to share information with : Yes, Verbal Permission Granted  Share Information with NAME: Andrew Larsen (daughter)        Permission granted to share info w Contact Information: [phone]  Emotional Assessment Appearance:: Appears stated age Attitude/Demeanor/Rapport: Engaged Affect (typically observed): Accepting, Appropriate Orientation: : Oriented to Self, Oriented to Place, Oriented to  Time, Oriented to Situation Alcohol / Substance Use: Not Applicable Psych Involvement: No (comment)  Admission diagnosis:  Closed fracture of left ankle, initial encounter [S82.892A] Accidental fall, initial encounter [W19.XXXA] Bimalleolar ankle fracture, left, closed, initial encounter [S82.842A] Stage 3a chronic kidney disease (HCC) [N18.31] Patient Active Problem List   Diagnosis Date Noted   Bimalleolar ankle fracture, left, closed, initial encounter 07/20/2023   Malignant neoplasm of prostate (HCC) 11/04/2022   Sepsis secondary to UTI (HCC) 09/06/2021   Complicated UTI (urinary tract infection) 09/06/2021   Severe sepsis (HCC) 09/01/2021   Cough 01/07/2019   Labile blood glucose    SOB (shortness of breath)    Urinary frequency    Hypoalbuminemia due to protein-calorie malnutrition (HCC)    Supplemental oxygen dependent    Controlled type 2 diabetes mellitus with hyperglycemia (HCC)    Stage 3a chronic kidney disease (CKD) (HCC)    Benign essential HTN    Physical debility 12/01/2018   Pressure  injury of skin 11/27/2018   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   Chronic kidney disease, stage 3b (HCC) 11/03/2018   Type II diabetes mellitus with stage 3 chronic kidney disease (HCC) 11/03/2018   Essential hypertension 11/03/2018   Claudication in peripheral vascular disease (HCC) 06/10/2016   Morbid obesity (HCC) 09/21/2015   Upper airway  cough syndrome 09/20/2015   Cough variant asthma 09/20/2015   PCP:  Alysia Penna, MD Pharmacy:   [facility] [phone] [place], [place] - [address] AT [facility] [address] [facility] [address] [place] [address] Phone: [phone] Fax: [phone]     Social Drivers of Health (SDOH) Social History: SDOH Screenings   Food Insecurity: No Food Insecurity (07/20/2023)  Housing: Low Risk  (07/20/2023)  Transportation Needs: No Transportation Needs (07/20/2023)  Utilities: Not At Risk (07/20/2023)  Alcohol Screen: Low Risk  (10/31/2022)  Depression (PHQ2-9): Low Risk  (10/31/2022)  Social Connections: Moderately Integrated (07/20/2023)  Tobacco Use: Medium Risk (07/20/2023)   SDOH Interventions:     Readmission Risk Interventions     No data to display

## 2023-07-22 NOTE — Progress Notes (Signed)
  Progress Note   Patient: Andrew Larsen DOB: 04/22/1941 DOA: 07/20/2023     2 DOS: the patient was seen and examined on 07/22/2023   Brief hospital course:   Assessment and Plan:  # Unstable left ankle fracture: pod1 for orif Dvt ppx per ortho For snf  #History of inclusion body myositis: Follows up with [facility].  Patient reports that contributes to his unstable gait  # Peripheral arterial disease: Plavix on hold at this time due to pending ortho OK to resume. Continue with Crestor and Zetia.  Stable ABI on last cardiology visit. Asa/plavix resumed  #Chronic lower extremity edema: Likely lymphedema.  Echocardiogram  from 2021 revealed preserved left ventricular ejection fraction of 60 to 65%.  Repeat echo here wnl  #Type 2 diabetes mellitus: A1c on this visit was 7.3.  Continue with insulin sliding scale per protocol.  Fingerstick glucose monitoring.  Unit protocol.  #Hypertension Blood pressures wnl./ On metoprolol tartrate 25 mg twice daily and losartan 25 mg daily.   #CKD 3a- Stable Baseline creatinine around 1.2-1.4.   Avoid nephrotoxic medication   #OSA -Resume home CPAP nightly  #BPH-continue Flomax  #History of stage I prostate cancer status post radiation therapy: Follows up with urology   # Debility pt advising snf, toc consulted       Subjective: feeling fine, pain controlled, tolerating supper  Physical Exam: Vitals:   07/22/23 0349 07/22/23 0500 07/22/23 0931 07/22/23 1152  BP: 127/64  127/64 (!) 118/57  Pulse: 68  68 71  Resp: 17   16  Temp: 98.1 F (36.7 C)   98.4 F (36.9 C)  TempSrc: Oral     SpO2: 96%   98%  Weight:  124.1 kg    Height:       General: Obese gentleman, alert oriented x 3.  Not in any acute distress. HEENT: Mucosa moist, Neck: Supple Chest: normal wob Cardiovascular: S1-S2 with no murmur. Extremities single for left lower extremity with dressing in place. Skin: Negative for any new rash.    CT scan  findings with radiologist IMPRESSION: 1. Stage 4 Weber B fracture dislocation of the ankle, with oblique lateral malleolar component, transverse medial malleolar component, and a small oblique posterior malleolar component. There is about 2 mm distraction at the lateral malleolar component and some minimal comminution distally at the lateral malleolus. The medial malleolar and posterior malleolar components are nondisplaced. Currently tibiotalar alignment is normal. 2. Circumferential subcutaneous edema in the ankle, tracking into the dorsum of the foot. 3. Atheromatous vascular calcifications  Family Communication:   Disposition: snf pending placement Status is: Inpatient Remains inpatient appropriate because: ORIF  Planned Discharge Destination: Rehab, toc consulted     Author: Silvano Bilis, MD 07/22/2023 5:03 PM  For on call review [email or web address].

## 2023-07-22 NOTE — Progress Notes (Signed)
    Subjective: Patient reports pain as {DEGREE - MILD, MOD, SEV:20224}.  Tolerating diet.  Urinating.   No CP, SOB.  Struggled to mobilize OOB with PT.  Objective:   VITALS:   Vitals:   07/22/23 0349 07/22/23 0500 07/22/23 0931 07/22/23 1152  BP: 127/64  127/64 (!) 118/57  Pulse: 68  68 71  Resp: 17   16  Temp: 98.1 F (36.7 C)   98.4 F (36.9 C)  TempSrc: Oral     SpO2: 96%   98%  Weight:  124.1 kg    Height:          Latest Ref Rng & Units 07/22/2023    4:13 AM 07/21/2023    4:18 AM 07/20/2023    2:49 PM  CBC  WBC 4.0 - 10.5 K/uL 8.9  7.2  7.2   Hemoglobin 13.0 - 17.0 g/dL 91.4  78.2  95.6   Hematocrit 39.0 - 52.0 % 39.6  39.8  42.4   Platelets 150 - 400 K/uL 166  152  159       Latest Ref Rng & Units 07/22/2023    4:13 AM 07/21/2023    4:18 AM 07/20/2023    2:49 PM  BMP  Glucose 70 - 99 mg/dL 213  086  578   BUN 8 - 23 mg/dL 24  20  21    Creatinine 0.61 - 1.24 mg/dL 4.69  6.29  5.28   Sodium 135 - 145 mmol/L 132  137  135   Potassium 3.5 - 5.1 mmol/L 4.3  3.9  3.7   Chloride 98 - 111 mmol/L 99  102  100   CO2 22 - 32 mmol/L 25  27  28    Calcium 8.9 - 10.3 mg/dL 8.4  8.3  8.4    Intake/Output      03/23 0701 03/24 0700 03/24 0701 03/25 0700   P.O.  480   I.V. (mL/kg) 900 (7.3)    IV Piggyback 150    Total Intake(mL/kg) 1050 (8.5) 480 (3.9)   Urine (mL/kg/hr) 300 (0.1) 250 (0.2)   Total Output 300 250   Net +750 +230           Physical Exam: General: NAD.  *** Resp: No increased wob Cardio: regular rate and rhythm ABD soft Neurologically intact MSK Neurovascularly intact Sensation intact distally Intact pulses distally Can wiggle toes Incision: dressing C/D/I   Assessment: 1 Day Post-Op  S/P Procedure(s) (LRB): OPEN REDUCTION INTERNAL FIXATION (ORIF) ANKLE FRACTURE (Left) by Dr. Jewel Baize. Eulah Pont on 07/21/23  Principal Problem:   Bimalleolar ankle fracture, left, closed, initial encounter Active Problems:   Inclusion body  myositis   Plan:  Advance diet Up with therapy Incentive Spirometry Elevate and Apply ice  Weightbearing: NWB LLE Insicional and dressing care: Dressings left intact until follow-up and Reinforce dressings as needed Orthopedic device(s): Splint Showering: Keep dressing dry VTE prophylaxis:  ASA 81mg  bid   x 30 days , SCDs, ambulation Pain control: PRN Follow - up plan: 2 weeks Contact information:  Margarita Rana MD, Levester Fresh PA-C  Dispo:  PT recommends SNF. Patient agreeable.      Jenne Pane, PA-C Office [phone] 07/22/2023, 4:45 PM

## 2023-07-23 ENCOUNTER — Other Ambulatory Visit (HOSPITAL_COMMUNITY): Payer: Self-pay

## 2023-07-23 DIAGNOSIS — N400 Enlarged prostate without lower urinary tract symptoms: Secondary | ICD-10-CM | POA: Insufficient documentation

## 2023-07-23 DIAGNOSIS — G4733 Obstructive sleep apnea (adult) (pediatric): Secondary | ICD-10-CM | POA: Insufficient documentation

## 2023-07-23 DIAGNOSIS — S82852A Displaced trimalleolar fracture of left lower leg, initial encounter for closed fracture: Secondary | ICD-10-CM

## 2023-07-23 DIAGNOSIS — I739 Peripheral vascular disease, unspecified: Secondary | ICD-10-CM | POA: Insufficient documentation

## 2023-07-23 LAB — GLUCOSE, CAPILLARY
Glucose-Capillary: 116 mg/dL — ABNORMAL HIGH (ref 70–99)
Glucose-Capillary: 143 mg/dL — ABNORMAL HIGH (ref 70–99)
Glucose-Capillary: 160 mg/dL — ABNORMAL HIGH (ref 70–99)
Glucose-Capillary: 179 mg/dL — ABNORMAL HIGH (ref 70–99)

## 2023-07-23 LAB — CBC
HCT: 38.9 % — ABNORMAL LOW (ref 39.0–52.0)
Hemoglobin: 12.7 g/dL — ABNORMAL LOW (ref 13.0–17.0)
MCH: 31.5 pg (ref 26.0–34.0)
MCHC: 32.6 g/dL (ref 30.0–36.0)
MCV: 96.5 fL (ref 80.0–100.0)
Platelets: 173 10*3/uL (ref 150–400)
RBC: 4.03 MIL/uL — ABNORMAL LOW (ref 4.22–5.81)
RDW: 13.5 % (ref 11.5–15.5)
WBC: 8.4 10*3/uL (ref 4.0–10.5)
nRBC: 0 % (ref 0.0–0.2)

## 2023-07-23 LAB — BASIC METABOLIC PANEL
Anion gap: 6 (ref 5–15)
BUN: 28 mg/dL — ABNORMAL HIGH (ref 8–23)
CO2: 27 mmol/L (ref 22–32)
Calcium: 8.3 mg/dL — ABNORMAL LOW (ref 8.9–10.3)
Chloride: 101 mmol/L (ref 98–111)
Creatinine, Ser: 1.24 mg/dL (ref 0.61–1.24)
GFR, Estimated: 59 mL/min — ABNORMAL LOW (ref >60)
Glucose, Bld: 135 mg/dL — ABNORMAL HIGH (ref 70–99)
Potassium: 4.6 mmol/L (ref 3.5–5.1)
Sodium: 134 mmol/L — ABNORMAL LOW (ref 135–145)

## 2023-07-23 MED ORDER — PHENYLEPHRINE HCL-NACL 20-0.9 MG/250ML-% IV SOLN
INTRAVENOUS | Status: AC
  Filled 2023-07-23: qty 250

## 2023-07-23 NOTE — Progress Notes (Signed)
   07/23/23 2257  BiPAP/CPAP/SIPAP  BiPAP/CPAP/SIPAP Pt Type Adult  BiPAP/CPAP/SIPAP DREAMSTATIOND  Mask Type Full face mask  Dentures removed? Not applicable  Mask Size Medium  Respiratory Rate 16 breaths/min  FiO2 (%) 21 %  Patient Home Machine No  Auto Titrate Yes  Minimum cmH2O 6 cmH2O  Maximum cmH2O 20 cmH2O  CPAP/SIPAP surface wiped down Yes  Device Plugged into RED Power Outlet Yes

## 2023-07-23 NOTE — Hospital Course (Signed)
 Andrew Larsen is a 83 year old male with PMH CKD, arthritis, DM II, GERD, HLD, HTN, PAD s/p angioplasty left SFA 2018, prostate cancer, OSA, inclusion body myositis who presented after a mechanical fall while using walker. He underwent x-ray and CT of the left ankle on admission which showed trimalleolar fracture. He was evaluated by orthopedic surgery and underwent operative repair on 07/21/2023.

## 2023-07-23 NOTE — Assessment & Plan Note (Signed)
 -  Continue Flomax

## 2023-07-23 NOTE — Assessment & Plan Note (Signed)
-   s/p mechanical fall - underwent ORIF with orthopedic surgery on 3/23 - continue NWB to LLE -Dressing to stay dry and intact until follow-up with orthopedic surgery; okay to shower as long as covered -2-week follow-up with orthopedic surgery planned - asa/plavix - planning for [facility] at discharge

## 2023-07-23 NOTE — Assessment & Plan Note (Addendum)
-   patient has history of CKD3b. Baseline creat ~ 1.4 - 1.5, eGFR~ 40-44; although still some GFR ranges in the 50s so possible still borderline stage 3a - renal function stable regardless and better than baseline

## 2023-07-23 NOTE — Progress Notes (Signed)
 Subjective: 2 Days Post-Op s/p Procedure(s): OPEN REDUCTION INTERNAL FIXATION (ORIF) ANKLE FRACTURE   Patient is alert, oriented. States pain has been severe, improved with recent pain medication. Has chosen SNF he would like to discharge to. No other complaints.    Objective:  PE: VITALS:   Vitals:   07/22/23 2051 07/23/23 0045 07/23/23 0552 07/23/23 1150  BP: 136/69 (!) 158/71 (!) 126/58 (!) 127/53  Pulse: 67 65 (!) 59 (!) 58  Resp: 16 18 17 18   Temp: 98.1 F (36.7 C) (!) 97.4 F (36.3 C) 97.7 F (36.5 C) 97.6 F (36.4 C)  TempSrc:      SpO2: 100% 95% 94% 97%  Weight:      Height:       LLE - Splint intact, appears well fitting, not too tight. Cap refill intact to toes. Patient endorses sensation to toes.   LABS  Results for orders placed or performed during the hospital encounter of 07/20/23 (from the past 24 hours)  Glucose, capillary     Status: Abnormal   Collection Time: 07/22/23  5:29 PM  Result Value Ref Range   Glucose-Capillary 213 (H) 70 - 99 mg/dL  Glucose, capillary     Status: Abnormal   Collection Time: 07/22/23  8:45 PM  Result Value Ref Range   Glucose-Capillary 186 (H) 70 - 99 mg/dL   Comment 1 Notify RN    Comment 2 Document in Chart   Basic metabolic panel     Status: Abnormal   Collection Time: 07/23/23  4:47 AM  Result Value Ref Range   Sodium 134 (L) 135 - 145 mmol/L   Potassium 4.6 3.5 - 5.1 mmol/L   Chloride 101 98 - 111 mmol/L   CO2 27 22 - 32 mmol/L   Glucose, Bld 135 (H) 70 - 99 mg/dL   BUN 28 (H) 8 - 23 mg/dL   Creatinine, Ser 0.98 0.61 - 1.24 mg/dL   Calcium 8.3 (L) 8.9 - 10.3 mg/dL   GFR, Estimated 59 (L) >60 mL/min   Anion gap 6 5 - 15  CBC     Status: Abnormal   Collection Time: 07/23/23  4:47 AM  Result Value Ref Range   WBC 8.4 4.0 - 10.5 K/uL   RBC 4.03 (L) 4.22 - 5.81 MIL/uL   Hemoglobin 12.7 (L) 13.0 - 17.0 g/dL   HCT 11.9 (L) 14.7 - 82.9 %   MCV 96.5 80.0 - 100.0 fL   MCH 31.5 26.0 - 34.0 pg   MCHC 32.6 30.0  - 36.0 g/dL   RDW 56.2 13.0 - 86.5 %   Platelets 173 150 - 400 K/uL   nRBC 0.0 0.0 - 0.2 %  Glucose, capillary     Status: Abnormal   Collection Time: 07/23/23  5:47 AM  Result Value Ref Range   Glucose-Capillary 116 (H) 70 - 99 mg/dL  Glucose, capillary     Status: Abnormal   Collection Time: 07/23/23 11:49 AM  Result Value Ref Range   Glucose-Capillary 143 (H) 70 - 99 mg/dL    ECHOCARDIOGRAM COMPLETE Result Date: 07/22/2023    ECHOCARDIOGRAM REPORT   Patient Name:   Andrew Larsen Date of Exam: 07/22/2023 Medical Rec #:  [id number]       Height:       71.0 in Accession #:    [id number]      Weight:       273.6 lb Date of Birth:  1940-07-26  BSA:          2.409 m Patient Age:    83 years        BP:           118/57 mmHg Patient Gender: M               HR:           79 bpm. Exam Location:  Inpatient Procedure: 2D Echo, Cardiac Doppler, Color Doppler and Intracardiac            Opacification Agent (Both Spectral and Color Flow Doppler were            utilized during procedure). Indications:    CHF  History:        Patient has no prior history of Echocardiogram examinations.                 Risk Factors:Diabetes, Hypertension and Sleep Apnea.  Sonographer:    Darlys Gales Referring Phys: [id number] SAGAR H JINWALA IMPRESSIONS  1. Left ventricular ejection fraction, by estimation, is 65 to 70%. The left ventricle has normal function. The left ventricle has no regional wall motion abnormalities. There is mild concentric left ventricular hypertrophy. Left ventricular diastolic parameters are indeterminate.  2. Right ventricular systolic function is normal. The right ventricular size is normal.  3. The mitral valve is normal in structure. Trivial mitral valve regurgitation. No evidence of mitral stenosis.  4. The aortic valve is tricuspid. There is mild calcification of the aortic valve. Aortic valve regurgitation is not visualized. Aortic valve sclerosis/calcification is present, without any evidence of  aortic stenosis. Conclusion(s)/Recommendation(s): Technically very limit study due to poor sound wave transmission. FINDINGS  Left Ventricle: Left ventricular ejection fraction, by estimation, is 65 to 70%. The left ventricle has normal function. The left ventricle has no regional wall motion abnormalities. Definity contrast agent was given IV to delineate the left ventricular  endocardial borders. The left ventricular internal cavity size was normal in size. There is mild concentric left ventricular hypertrophy. Left ventricular diastolic parameters are indeterminate. Right Ventricle: The right ventricular size is normal. No increase in right ventricular wall thickness. Right ventricular systolic function is normal. Left Atrium: Left atrial size was not well visualized. Right Atrium: Right atrial size was not well visualized. Pericardium: There is no evidence of pericardial effusion. Mitral Valve: The mitral valve is normal in structure. Mild mitral annular calcification. Trivial mitral valve regurgitation. No evidence of mitral valve stenosis. Tricuspid Valve: The tricuspid valve is normal in structure. Tricuspid valve regurgitation is trivial. No evidence of tricuspid stenosis. Aortic Valve: The aortic valve is tricuspid. There is mild calcification of the aortic valve. Aortic valve regurgitation is not visualized. Aortic valve sclerosis/calcification is present, without any evidence of aortic stenosis. Aortic valve peak gradient measures 5.9 mmHg. Pulmonic Valve: The pulmonic valve was not well visualized. Pulmonic valve regurgitation is not visualized. No evidence of pulmonic stenosis. Aorta: The aortic root is normal in size and structure. IAS/Shunts: No atrial level shunt detected by color flow Doppler.  LEFT VENTRICLE PLAX 2D LVIDd:         4.00 cm LVIDs:         2.20 cm LV PW:         1.30 cm LV IVS:        1.30 cm LVOT diam:     1.90 cm LVOT Area:     2.84 cm  RIGHT VENTRICLE RV S prime:  9.79 cm/s  AORTIC VALVE AV Vmax:      121.00 cm/s AV Peak Grad: 5.9 mmHg  AORTA Ao Root diam: 3.20 cm MITRAL VALVE MV Area (PHT): 2.10 cm     SHUNTS MV E velocity: 103.00 cm/s  Systemic Diam: 1.90 cm MV A velocity: 141.00 cm/s MV E/A ratio:  0.73 Arvilla Meres MD Electronically signed by Arvilla Meres MD Signature Date/Time: 07/22/2023/3:28:21 PM    Final     Assessment/Plan: Principal Problem:   Bimalleolar ankle fracture, left, closed, initial encounter Active Problems:   Inclusion body myositis   2 Days Post-Op s/p Procedure(s): OPEN REDUCTION INTERNAL FIXATION (ORIF) ANKLE FRACTURE  Weightbearing: NWB LLE Insicional and dressing care: Dressings left intact until follow-up Orthopedic device(s): Splint Showering: OK to shower as long as splint is covered  VTE prophylaxis: plavix and asa Pain control: continue current regimen Follow - up plan: 2 weeks with Dr. Leanord Asal PA  Dispo: ok to d/c to SNF from ortho perspective when cleared by medicine   Contact information:   Jerral Ralph Weekdays 8-5  After hours and holidays please check [email or web address] for group call information for [facility]  Armida Sans 07/23/2023, 3:46 PM

## 2023-07-23 NOTE — Progress Notes (Signed)
 Physical Therapy Treatment Patient Details Name: Andrew Larsen MRN: [id number] DOB: 01/11/1941 Today's Date: 07/23/2023   History of Present Illness Andrew Larsen is an 83 yo male s/p L ankle ORIF 3/23 after a fall at home. PMH: CKD, diabetes, GERD, diabetes, glaucoma, HTN, prostate CA, R ankle ORIF 10/02/12    PT Comments  Pt AxO x 3 pleasant and motivated.  Assisted OOB to recliner.  General bed mobility comments: increased time and use of rail to transition to EOB.  Self able to use urinal. General transfer comment: from elevated bed to lateral scooting to recliner VC's on proper hand placeemnt and safety with turn completion. Pt was unable to stand upright in walker due to weakness.  Unable to attempt gait.  Positioned in recliner and elevated L LE along with ICE packs.   Pt will need ST Rehab at SNF to address mobility and functional decline prior to safely returning home.    If plan is discharge home, recommend the following: Two people to help with walking and/or transfers;Two people to help with bathing/dressing/bathroom;Assistance with cooking/housework;Assist for transportation;Help with stairs or ramp for entrance   Can travel by private vehicle     No  Equipment Recommendations  None recommended by PT    Recommendations for Other Services       Precautions / Restrictions Precautions Precautions: Fall Restrictions Weight Bearing Restrictions Per Provider Order: Yes LLE Weight Bearing Per Provider Order: Non weight bearing     Mobility  Bed Mobility Overal bed mobility: Needs Assistance Bed Mobility: Supine to Sit     Supine to sit: Contact guard, HOB elevated, Used rails     General bed mobility comments: increased time and use of rail to transition to EOB.  Self able to use urinal.    Transfers Overall transfer level: Needs assistance Equipment used: None Transfers: Sit to/from Stand            Lateral/Scoot Transfers: Mod assist, +2 physical  assistance, +2 safety/equipment General transfer comment: from elevated bed to lateral scooting to recliner VC's on proper hand placeemnt and safety with turn completion.     Ambulation/Gait               General Gait Details: transfers only this session   Stairs             Wheelchair Mobility     Tilt Bed    Modified Rankin (Stroke Patients Only)       Balance                                            Communication    Cognition Arousal: Alert Behavior During Therapy: WFL for tasks assessed/performed, Impulsive   PT - Cognitive impairments: No apparent impairments                       PT - Cognition Comments: AxO x 3 pleasant Following commands: Intact      Cueing    Exercises      General Comments        Pertinent Vitals/Pain Pain Assessment Pain Assessment: Faces Faces Pain Scale: Hurts little more Pain Location: L ankle Pain Descriptors / Indicators: Grimacing, Guarding, Operative site guarding Pain Intervention(s): Monitored during session, Premedicated before session, Repositioned, Ice applied    Home Living  Prior Function            PT Goals (current goals can now be found in the care plan section) Progress towards PT goals: Progressing toward goals    Frequency    Min 2X/week      PT Plan      Co-evaluation              AM-PAC PT "6 Clicks" Mobility   Outcome Measure  Help needed turning from your back to your side while in a flat bed without using bedrails?: A Little Help needed moving from lying on your back to sitting on the side of a flat bed without using bedrails?: A Little Help needed moving to and from a bed to a chair (including a wheelchair)?: Total Help needed standing up from a chair using your arms (e.g., wheelchair or bedside chair)?: Total Help needed to walk in hospital room?: Total Help needed climbing 3-5 steps with a railing? :  Total 6 Click Score: 10    End of Session Equipment Utilized During Treatment: Gait belt Activity Tolerance: Patient tolerated treatment well Patient left: in chair;with call bell/phone within reach;with family/visitor present Nurse Communication: Need for lift equipment PT Visit Diagnosis: Unsteadiness on feet (R26.81);Other abnormalities of gait and mobility (R26.89);Muscle weakness (generalized) (M62.81);Pain Pain - Right/Left: Left Pain - part of body: Ankle and joints of foot     Time: 1610-9604 PT Time Calculation (min) (ACUTE ONLY): 26 min  Charges:    $Gait Training: 8-22 mins $Therapeutic Exercise: 8-22 mins PT General Charges $$ ACUTE PT VISIT: 1 Visit                     {Taelynn Mcelhannon  PTA Acute  Rehabilitation Services Office M-F          [phone]

## 2023-07-23 NOTE — Plan of Care (Signed)

## 2023-07-23 NOTE — Anesthesia Postprocedure Evaluation (Signed)
 Anesthesia Post Note  Patient: Andrew Larsen  Procedure(s) Performed: OPEN REDUCTION INTERNAL FIXATION (ORIF) ANKLE FRACTURE (Left: Ankle)     Patient location during evaluation: PACU Anesthesia Type: General and Regional Level of consciousness: awake and alert Pain management: pain level controlled Vital Signs Assessment: post-procedure vital signs reviewed and stable Respiratory status: spontaneous breathing, nonlabored ventilation, respiratory function stable and patient connected to nasal cannula oxygen Cardiovascular status: blood pressure returned to baseline and stable Postop Assessment: no apparent nausea or vomiting Anesthetic complications: no   No notable events documented.  Last Vitals:  Vitals:   07/23/23 0552 07/23/23 1150  BP: (!) 126/58 (!) 127/53  Pulse: (!) 59 (!) 58  Resp: 17 18  Temp: 36.5 C 36.4 C  SpO2: 94% 97%    Last Pain:  Vitals:   07/23/23 1300  TempSrc:   PainSc: 5                  London Tarnowski S

## 2023-07-23 NOTE — Plan of Care (Signed)

## 2023-07-23 NOTE — Assessment & Plan Note (Signed)
-   S/p radiation therapy - Follows with urology

## 2023-07-23 NOTE — Assessment & Plan Note (Signed)
 -  Continue nightly CPAP

## 2023-07-23 NOTE — Progress Notes (Signed)
 Progress Note    Andrew Larsen   EAV:[id number]  DOB: [birth year]  DOA: 07/20/2023     3 PCP: Andrew Penna, MD  Initial CC: fall at home  Hospital Course: Andrew Larsen is a 83 year old male with PMH CKD, arthritis, DM II, GERD, HLD, HTN, PAD s/p angioplasty left SFA 2018, prostate cancer, OSA, inclusion body myositis who presented after a mechanical fall while using walker. He underwent x-ray and CT of the left ankle on admission which showed trimalleolar fracture. He was evaluated by orthopedic surgery and underwent operative repair on 07/21/2023.  Interval History:  No issues overnight.  Daughter present bedside this morning.  He has maintained nonweightbearing status to LLE and been working with PT as able. They have chosen [facility] for rehab at discharge.  Assessment and Plan: * Trimalleolar fracture of ankle, closed, left, initial encounter - s/p mechanical fall - underwent ORIF with orthopedic surgery on 3/23 - continue NWB to LLE -Dressing to stay dry and intact until follow-up with orthopedic surgery; okay to shower as long as covered -2-week follow-up with orthopedic surgery planned - asa/plavix - planning for SNF at discharge   BPH (benign prostatic hyperplasia) - Continue Flomax  OSA (obstructive sleep apnea) - Continue nightly CPAP  PAD (peripheral artery disease) (HCC) - Continue aspirin and Plavix -Continue Crestor  Malignant neoplasm of prostate (HCC) - S/p radiation therapy - Follows with urology  Essential hypertension - Continue losartan and metoprolol  DMII (diabetes mellitus, type 2) (HCC) - A1c 7.3% on 07/21/2023 - Continue SSI and CBG monitoring  Chronic kidney disease, stage 3b (HCC) - patient has history of CKD3b. Baseline creat ~ 1.4 - 1.5, eGFR~ 40-44; although still some GFR ranges in the 50s so possible still borderline stage 3a - renal function stable regardless and better than baseline    Old records reviewed in assessment of  this patient  Antimicrobials:   DVT prophylaxis:  SCDs Start: 07/21/23 1418   Code Status:   Code Status: Full Code  Mobility Assessment (Last 72 Hours)     Mobility Assessment     Row Name 07/23/23 1421 07/23/23 1000 07/22/23 2000 07/22/23 1800 07/22/23 1546   Does patient have an order for bedrest or is patient medically unstable -- No - Continue assessment No - Continue assessment No - Continue assessment --   What is the highest level of mobility based on the progressive mobility assessment? Level 3 (Stands with assist) - Balance while standing  and cannot march in place Level 3 (Stands with assist) - Balance while standing  and cannot march in place Level 5 (Walks with assist in room/hall) - Balance while stepping forward/back and can walk in room with assist - Complete Level 5 (Walks with assist in room/hall) - Balance while stepping forward/back and can walk in room with assist - Complete Level 2 (Chairfast) - Balance while sitting on edge of bed and cannot stand   Is the above level different from baseline mobility prior to current illness? -- Yes - Recommend PT order -- -- --    Row Name 07/22/23 1302 07/22/23 0850 07/21/23 2010 07/21/23 0845 07/20/23 2045   Does patient have an order for bedrest or is patient medically unstable -- Yes- Bedfast (Level 1) - Complete Yes- Bedfast (Level 1) - Complete Yes- Bedfast (Level 1) - Complete Yes- Bedfast (Level 1) - Complete   What is the highest level of mobility based on the progressive mobility assessment? Level 2 (Chairfast) - Balance while  sitting on edge of bed and cannot stand Level 3 (Stands with assist) - Balance while standing  and cannot march in place Level 2 (Chairfast) - Balance while sitting on edge of bed and cannot stand Level 2 (Chairfast) - Balance while sitting on edge of bed and cannot stand --   Is the above level different from baseline mobility prior to current illness? -- Yes - Recommend PT order Yes - Recommend PT  order Yes - Recommend PT order --            Barriers to discharge: None Disposition Plan: [facility] Status is: Inpatient  Objective: Blood pressure (!) 127/53, pulse (!) 58, temperature 97.6 F (36.4 C), resp. rate 18, height 5\' 11"  (1.803 m), weight 124.1 kg, SpO2 97%.  Examination:  Physical Exam Constitutional:      General: He is not in acute distress.    Appearance: Normal appearance.  HENT:     Head: Normocephalic and atraumatic.     Mouth/Throat:     Mouth: Mucous membranes are moist.  Eyes:     Extraocular Movements: Extraocular movements intact.  Cardiovascular:     Rate and Rhythm: Normal rate and regular rhythm.  Pulmonary:     Effort: Pulmonary effort is normal. No respiratory distress.     Breath sounds: Normal breath sounds. No wheezing.  Abdominal:     General: Bowel sounds are normal. There is no distension.     Palpations: Abdomen is soft.     Tenderness: There is no abdominal tenderness.  Musculoskeletal:     Cervical back: Normal range of motion and neck supple.     Comments: Left leg/ankle/foot wrapped in surgical bandage; toes exposed and warm well perfused soft upper leg  Skin:    General: Skin is warm and dry.  Neurological:     General: No focal deficit present.     Mental Status: He is alert.  Psychiatric:        Mood and Affect: Mood normal.        Behavior: Behavior normal.      Consultants:  Orthopedic surgery  Procedures:  3/23: ORIF  Data Reviewed: Results for orders placed or performed during the hospital encounter of 07/20/23 (from the past 24 hours)  Glucose, capillary     Status: Abnormal   Collection Time: 07/22/23  8:45 PM  Result Value Ref Range   Glucose-Capillary 186 (H) 70 - 99 mg/dL   Comment 1 Notify RN    Comment 2 Document in Chart   Basic metabolic panel     Status: Abnormal   Collection Time: 07/23/23  4:47 AM  Result Value Ref Range   Sodium 134 (L) 135 - 145 mmol/L   Potassium 4.6 3.5 - 5.1 mmol/L    Chloride 101 98 - 111 mmol/L   CO2 27 22 - 32 mmol/L   Glucose, Bld 135 (H) 70 - 99 mg/dL   BUN 28 (H) 8 - 23 mg/dL   Creatinine, Ser 6.96 0.61 - 1.24 mg/dL   Calcium 8.3 (L) 8.9 - 10.3 mg/dL   GFR, Estimated 59 (L) >60 mL/min   Anion gap 6 5 - 15  CBC     Status: Abnormal   Collection Time: 07/23/23  4:47 AM  Result Value Ref Range   WBC 8.4 4.0 - 10.5 K/uL   RBC 4.03 (L) 4.22 - 5.81 MIL/uL   Hemoglobin 12.7 (L) 13.0 - 17.0 g/dL   HCT 29.5 (L) 28.4 - 13.2 %  MCV 96.5 80.0 - 100.0 fL   MCH 31.5 26.0 - 34.0 pg   MCHC 32.6 30.0 - 36.0 g/dL   RDW 30.8 65.7 - 84.6 %   Platelets 173 150 - 400 K/uL   nRBC 0.0 0.0 - 0.2 %  Glucose, capillary     Status: Abnormal   Collection Time: 07/23/23  5:47 AM  Result Value Ref Range   Glucose-Capillary 116 (H) 70 - 99 mg/dL  Glucose, capillary     Status: Abnormal   Collection Time: 07/23/23 11:49 AM  Result Value Ref Range   Glucose-Capillary 143 (H) 70 - 99 mg/dL  Glucose, capillary     Status: Abnormal   Collection Time: 07/23/23  5:12 PM  Result Value Ref Range   Glucose-Capillary 160 (H) 70 - 99 mg/dL    I have reviewed pertinent nursing notes, vitals, labs, and images as necessary. I have ordered labwork to follow up on as indicated.  I have reviewed the last notes from staff over past 24 hours. I have discussed patient's care plan and test results with nursing staff, CM/SW, and other staff as appropriate.  Time spent: Greater than 50% of the 55 minute visit was spent in counseling/coordination of care for the patient as laid out in the A&P.   LOS: 3 days   Lewie Chamber, MD [facility] 07/23/2023, 6:30 PM

## 2023-07-23 NOTE — Assessment & Plan Note (Signed)
 Continue losartan and metoprolol

## 2023-07-23 NOTE — Assessment & Plan Note (Addendum)
-   Continue aspirin and Plavix -Continue Crestor

## 2023-07-23 NOTE — TOC Progression Note (Addendum)
 Transition of Care [facility]) - Progression Note    Patient Details  Name: Andrew Larsen MRN: [id number] Date of Birth: 1940-11-01  Transition of [facility]) CM/SW Contact  Jessie Foot, RN Phone Number: 07/23/2023, 9:10 AM  Clinical Narrative:    1:07 PM Authorization started with [employer] (Tammy). 12:15 Called [employer]. Left message to return call to start authorization. 11:00 Patient and daughter [name]) chose [facility], [place] [address], [place], [place]. Patient had a recent stay there. Presented patient with choice. [email or web address]  07/23/2023  [facility] [address] [place], [place] [address] [phone] Overall rating? Below average   [facility] [address] [place], [place] [address] [phone] Overall rating ??? Below average     Expected Discharge Plan: Skilled Nursing Facility Barriers to Discharge: Continued Medical Work up  Expected Discharge Plan and Services   Discharge Planning Services: CM Consult   Living arrangements for the past 2 months: Single Family Home                                       Social Determinants of Health (SDOH) Interventions SDOH Screenings   Food Insecurity: No Food Insecurity (07/20/2023)  Housing: Low Risk  (07/20/2023)  Transportation Needs: No Transportation Needs (07/20/2023)  Utilities: Not At Risk (07/20/2023)  Alcohol Screen: Low Risk  (10/31/2022)  Depression (PHQ2-9): Low Risk  (10/31/2022)  Social Connections: Moderately Integrated (07/20/2023)  Tobacco Use: Medium Risk (07/20/2023)    Readmission Risk Interventions     No data to display

## 2023-07-23 NOTE — Assessment & Plan Note (Signed)
-   A1c 7.3% on 07/21/2023 - resume home regimen

## 2023-07-23 NOTE — TOC Progression Note (Signed)
 Transition of Care [facility]) - Progression Note    Patient Details  Name: Andrew Larsen MRN: [id number] Date of Birth: Sep 25, 1940  Transition [facility]) CM/SW Contact  Jessie Foot, RN Phone Number: 07/23/2023, 3:59 PM  Clinical Narrative:   Tammy with [employer] called with Berkley Harvey approval. Auth received [facility] for 7 days. Auth # [id number] Auth received for transport with [facility]. Berkley Harvey [id number]   Expected Discharge Plan: Skilled Nursing Facility Barriers to Discharge: Continued Medical Work up  Expected Discharge Plan and Services   Discharge Planning Services: CM Consult   Living arrangements for the past 2 months: Single Family Home                                       Social Determinants of Health (SDOH) Interventions SDOH Screenings   Food Insecurity: No Food Insecurity (07/20/2023)  Housing: Low Risk  (07/20/2023)  Transportation Needs: No Transportation Needs (07/20/2023)  Utilities: Not At Risk (07/20/2023)  Alcohol Screen: Low Risk  (10/31/2022)  Depression (PHQ2-9): Low Risk  (10/31/2022)  Social Connections: Moderately Integrated (07/20/2023)  Tobacco Use: Medium Risk (07/20/2023)    Readmission Risk Interventions     No data to display

## 2023-07-24 ENCOUNTER — Other Ambulatory Visit (HOSPITAL_COMMUNITY): Payer: Self-pay

## 2023-07-24 ENCOUNTER — Other Ambulatory Visit: Payer: Self-pay | Admitting: Student

## 2023-07-24 DIAGNOSIS — N400 Enlarged prostate without lower urinary tract symptoms: Secondary | ICD-10-CM | POA: Diagnosis not present

## 2023-07-24 DIAGNOSIS — E1165 Type 2 diabetes mellitus with hyperglycemia: Secondary | ICD-10-CM | POA: Diagnosis not present

## 2023-07-24 DIAGNOSIS — T8459XA Infection and inflammatory reaction due to other internal joint prosthesis, initial encounter: Secondary | ICD-10-CM | POA: Diagnosis present

## 2023-07-24 DIAGNOSIS — R600 Localized edema: Secondary | ICD-10-CM | POA: Diagnosis not present

## 2023-07-24 DIAGNOSIS — F419 Anxiety disorder, unspecified: Secondary | ICD-10-CM | POA: Diagnosis not present

## 2023-07-24 DIAGNOSIS — J8410 Pulmonary fibrosis, unspecified: Secondary | ICD-10-CM | POA: Diagnosis present

## 2023-07-24 DIAGNOSIS — I5030 Unspecified diastolic (congestive) heart failure: Secondary | ICD-10-CM | POA: Diagnosis not present

## 2023-07-24 DIAGNOSIS — T8454XA Infection and inflammatory reaction due to internal left knee prosthesis, initial encounter: Secondary | ICD-10-CM | POA: Diagnosis not present

## 2023-07-24 DIAGNOSIS — G7241 Inclusion body myositis [IBM]: Secondary | ICD-10-CM | POA: Diagnosis not present

## 2023-07-24 DIAGNOSIS — E7800 Pure hypercholesterolemia, unspecified: Secondary | ICD-10-CM | POA: Diagnosis present

## 2023-07-24 DIAGNOSIS — G4700 Insomnia, unspecified: Secondary | ICD-10-CM | POA: Diagnosis not present

## 2023-07-24 DIAGNOSIS — I10 Essential (primary) hypertension: Secondary | ICD-10-CM | POA: Diagnosis not present

## 2023-07-24 DIAGNOSIS — I1310 Hypertensive heart and chronic kidney disease without heart failure, with stage 1 through stage 4 chronic kidney disease, or unspecified chronic kidney disease: Secondary | ICD-10-CM | POA: Diagnosis present

## 2023-07-24 DIAGNOSIS — T8140XA Infection following a procedure, unspecified, initial encounter: Secondary | ICD-10-CM | POA: Diagnosis not present

## 2023-07-24 DIAGNOSIS — I129 Hypertensive chronic kidney disease with stage 1 through stage 4 chronic kidney disease, or unspecified chronic kidney disease: Secondary | ICD-10-CM | POA: Diagnosis not present

## 2023-07-24 DIAGNOSIS — N1832 Chronic kidney disease, stage 3b: Secondary | ICD-10-CM | POA: Diagnosis not present

## 2023-07-24 DIAGNOSIS — I130 Hypertensive heart and chronic kidney disease with heart failure and stage 1 through stage 4 chronic kidney disease, or unspecified chronic kidney disease: Secondary | ICD-10-CM | POA: Diagnosis not present

## 2023-07-24 DIAGNOSIS — Z741 Need for assistance with personal care: Secondary | ICD-10-CM | POA: Diagnosis not present

## 2023-07-24 DIAGNOSIS — Z8546 Personal history of malignant neoplasm of prostate: Secondary | ICD-10-CM | POA: Diagnosis not present

## 2023-07-24 DIAGNOSIS — K5900 Constipation, unspecified: Secondary | ICD-10-CM | POA: Diagnosis not present

## 2023-07-24 DIAGNOSIS — E1140 Type 2 diabetes mellitus with diabetic neuropathy, unspecified: Secondary | ICD-10-CM | POA: Diagnosis not present

## 2023-07-24 DIAGNOSIS — T8489XA Other specified complication of internal orthopedic prosthetic devices, implants and grafts, initial encounter: Secondary | ICD-10-CM | POA: Diagnosis not present

## 2023-07-24 DIAGNOSIS — U099 Post covid-19 condition, unspecified: Secondary | ICD-10-CM | POA: Diagnosis present

## 2023-07-24 DIAGNOSIS — Y831 Surgical operation with implant of artificial internal device as the cause of abnormal reaction of the patient, or of later complication, without mention of misadventure at the time of the procedure: Secondary | ICD-10-CM | POA: Diagnosis present

## 2023-07-24 DIAGNOSIS — B9689 Other specified bacterial agents as the cause of diseases classified elsewhere: Secondary | ICD-10-CM | POA: Diagnosis present

## 2023-07-24 DIAGNOSIS — R262 Difficulty in walking, not elsewhere classified: Secondary | ICD-10-CM | POA: Diagnosis not present

## 2023-07-24 DIAGNOSIS — M86172 Other acute osteomyelitis, left ankle and foot: Secondary | ICD-10-CM | POA: Diagnosis not present

## 2023-07-24 DIAGNOSIS — Z9181 History of falling: Secondary | ICD-10-CM | POA: Diagnosis not present

## 2023-07-24 DIAGNOSIS — I739 Peripheral vascular disease, unspecified: Secondary | ICD-10-CM | POA: Diagnosis not present

## 2023-07-24 DIAGNOSIS — M96840 Postprocedural hematoma of a musculoskeletal structure following a musculoskeletal system procedure: Secondary | ICD-10-CM | POA: Diagnosis not present

## 2023-07-24 DIAGNOSIS — E66812 Obesity, class 2: Secondary | ICD-10-CM | POA: Diagnosis present

## 2023-07-24 DIAGNOSIS — E8809 Other disorders of plasma-protein metabolism, not elsewhere classified: Secondary | ICD-10-CM | POA: Diagnosis present

## 2023-07-24 DIAGNOSIS — M6281 Muscle weakness (generalized): Secondary | ICD-10-CM | POA: Diagnosis not present

## 2023-07-24 DIAGNOSIS — I509 Heart failure, unspecified: Secondary | ICD-10-CM | POA: Diagnosis not present

## 2023-07-24 DIAGNOSIS — Z472 Encounter for removal of internal fixation device: Secondary | ICD-10-CM | POA: Diagnosis not present

## 2023-07-24 DIAGNOSIS — Z794 Long term (current) use of insulin: Secondary | ICD-10-CM | POA: Diagnosis not present

## 2023-07-24 DIAGNOSIS — S82852A Displaced trimalleolar fracture of left lower leg, initial encounter for closed fracture: Secondary | ICD-10-CM | POA: Diagnosis not present

## 2023-07-24 DIAGNOSIS — M65072 Abscess of tendon sheath, left ankle and foot: Secondary | ICD-10-CM | POA: Diagnosis not present

## 2023-07-24 DIAGNOSIS — E1122 Type 2 diabetes mellitus with diabetic chronic kidney disease: Secondary | ICD-10-CM | POA: Diagnosis not present

## 2023-07-24 DIAGNOSIS — S82852D Displaced trimalleolar fracture of left lower leg, subsequent encounter for closed fracture with routine healing: Secondary | ICD-10-CM | POA: Diagnosis not present

## 2023-07-24 DIAGNOSIS — T81328A Disruption or dehiscence of closure of other specified internal operation (surgical) wound, initial encounter: Secondary | ICD-10-CM | POA: Diagnosis present

## 2023-07-24 DIAGNOSIS — E1151 Type 2 diabetes mellitus with diabetic peripheral angiopathy without gangrene: Secondary | ICD-10-CM | POA: Diagnosis not present

## 2023-07-24 DIAGNOSIS — R2681 Unsteadiness on feet: Secondary | ICD-10-CM | POA: Diagnosis not present

## 2023-07-24 DIAGNOSIS — N1831 Chronic kidney disease, stage 3a: Secondary | ICD-10-CM | POA: Diagnosis present

## 2023-07-24 DIAGNOSIS — T8469XA Infection and inflammatory reaction due to internal fixation device of other site, initial encounter: Secondary | ICD-10-CM | POA: Diagnosis present

## 2023-07-24 DIAGNOSIS — M00872 Arthritis due to other bacteria, left ankle and foot: Secondary | ICD-10-CM | POA: Diagnosis present

## 2023-07-24 DIAGNOSIS — G4733 Obstructive sleep apnea (adult) (pediatric): Secondary | ICD-10-CM | POA: Diagnosis not present

## 2023-07-24 DIAGNOSIS — E785 Hyperlipidemia, unspecified: Secondary | ICD-10-CM | POA: Diagnosis not present

## 2023-07-24 DIAGNOSIS — J849 Interstitial pulmonary disease, unspecified: Secondary | ICD-10-CM | POA: Diagnosis not present

## 2023-07-24 DIAGNOSIS — Z96662 Presence of left artificial ankle joint: Secondary | ICD-10-CM | POA: Diagnosis not present

## 2023-07-24 DIAGNOSIS — Z87891 Personal history of nicotine dependence: Secondary | ICD-10-CM | POA: Diagnosis not present

## 2023-07-24 DIAGNOSIS — S82852G Displaced trimalleolar fracture of left lower leg, subsequent encounter for closed fracture with delayed healing: Secondary | ICD-10-CM | POA: Diagnosis not present

## 2023-07-24 DIAGNOSIS — H409 Unspecified glaucoma: Secondary | ICD-10-CM | POA: Diagnosis not present

## 2023-07-24 DIAGNOSIS — R7881 Bacteremia: Secondary | ICD-10-CM | POA: Diagnosis present

## 2023-07-24 DIAGNOSIS — W19XXXD Unspecified fall, subsequent encounter: Secondary | ICD-10-CM | POA: Diagnosis present

## 2023-07-24 DIAGNOSIS — R2689 Other abnormalities of gait and mobility: Secondary | ICD-10-CM | POA: Diagnosis not present

## 2023-07-24 DIAGNOSIS — M869 Osteomyelitis, unspecified: Secondary | ICD-10-CM | POA: Diagnosis present

## 2023-07-24 DIAGNOSIS — E46 Unspecified protein-calorie malnutrition: Secondary | ICD-10-CM | POA: Diagnosis present

## 2023-07-24 DIAGNOSIS — E1169 Type 2 diabetes mellitus with other specified complication: Secondary | ICD-10-CM | POA: Diagnosis present

## 2023-07-24 LAB — GLUCOSE, CAPILLARY
Glucose-Capillary: 136 mg/dL — ABNORMAL HIGH (ref 70–99)
Glucose-Capillary: 128 mg/dL — ABNORMAL HIGH (ref 70–99)

## 2023-07-24 MED ORDER — OXYCODONE HCL 10 MG PO TABS
10.0000 mg | ORAL_TABLET | ORAL | 0 refills | Status: DC | PRN
Start: 2023-07-24 — End: 2023-07-24

## 2023-07-24 MED ORDER — DOCUSATE SODIUM 100 MG PO CAPS: 100.0000 mg | ORAL_CAPSULE | Freq: Two times a day (BID) | ORAL | Status: DC | PRN

## 2023-07-24 MED ORDER — ASPIRIN 81 MG PO TBEC: 81.0000 mg | DELAYED_RELEASE_TABLET | Freq: Two times a day (BID) | ORAL | Status: DC

## 2023-07-24 MED ORDER — ZOLPIDEM TARTRATE 5 MG PO TABS: 5.0000 mg | ORAL_TABLET | Freq: Every evening | ORAL | 0 refills | Status: DC | PRN

## 2023-07-24 MED ORDER — OXYCODONE HCL 10 MG PO TABS: 10.0000 mg | ORAL_TABLET | ORAL | 0 refills | Status: DC | PRN

## 2023-07-24 MED ORDER — POLYETHYLENE GLYCOL 3350 17 G PO PACK
17.0000 g | PACK | Freq: Every day | ORAL | Status: AC | PRN
Start: 2023-07-24 — End: ?

## 2023-07-24 NOTE — Progress Notes (Signed)
 Patient to admit to [facility].

## 2023-07-24 NOTE — Plan of Care (Signed)

## 2023-07-24 NOTE — Assessment & Plan Note (Signed)
-   contributes some to his LE weakness at times

## 2023-07-24 NOTE — Discharge Summary (Signed)
 Physician Discharge Summary   Andrew Larsen:[id number] DOB: [birth year] DOA: 07/20/2023  PCP: Andrew Penna, MD  Admit date: 07/20/2023 Discharge date: 07/24/2023  Admitted From: Home Disposition:  [place] Discharging physician: Andrew Chamber, MD Barriers to discharge: none  Recommendations at discharge: Follow up with orthopedic surgery Keep LLE bandage in place and clean/dry/intact until outpt followup with ortho; okay to wrap it while showering  Discharge Condition: stable CODE STATUS: Full  Diet recommendation:  Diet Orders (From admission, onward)     Start     Ordered   07/24/23 0000  Diet Carb Modified        07/24/23 1017   07/21/23 1522  Diet Carb Modified Fluid consistency: Thin; Room service appropriate? Yes  Diet effective now       Question Answer Comment  Diet-HS Snack? Nothing   Calorie Level Medium 1600-2000   Fluid consistency: Thin   Room service appropriate? Yes      07/21/23 1521            Hospital Course: [name] is a 83 year old male with PMH CKD, arthritis, DM II, GERD, HLD, HTN, PAD s/p angioplasty left SFA 2018, prostate cancer, OSA, inclusion body myositis who presented after a mechanical fall while using walker. He underwent x-ray and CT of the left ankle on admission which showed trimalleolar fracture. He was evaluated by orthopedic surgery and underwent operative repair on 07/21/2023.  Assessment and Plan: * Trimalleolar fracture of ankle, closed, left, initial encounter - s/p mechanical fall - underwent ORIF with orthopedic surgery on 3/23 - continue NWB to LLE -Dressing to stay dry and intact until follow-up with orthopedic surgery; okay to shower as long as covered -2-week follow-up with orthopedic surgery planned - asa/plavix - planning for [facility] at discharge   BPH (benign prostatic hyperplasia) - Continue Flomax  OSA (obstructive sleep apnea) - Continue nightly CPAP  PAD (peripheral artery disease) (HCC) -  Continue aspirin and Plavix -Continue Crestor  Malignant neoplasm of prostate (HCC) - S/p radiation therapy - Follows with urology  Inclusion body myositis - contributes some to his LE weakness at times  Essential hypertension - Continue losartan and metoprolol  DMII (diabetes mellitus, type 2) (HCC) - A1c 7.3% on 07/21/2023 - resume home regimen  Chronic kidney disease, stage 3b (HCC) - patient has history of CKD3b. Baseline creat ~ 1.4 - 1.5, eGFR~ 40-44; although still some GFR ranges in the 50s so possible still borderline stage 3a - renal function stable regardless and better than baseline    Principal Diagnosis: Trimalleolar fracture of ankle, closed, left, initial encounter  Discharge Diagnoses: Active Hospital Problems   Diagnosis Date Noted   Trimalleolar fracture of ankle, closed, left, initial encounter 07/20/2023    Priority: 1.   PAD (peripheral artery disease) (HCC) 07/23/2023   OSA (obstructive sleep apnea) 07/23/2023   BPH (benign prostatic hyperplasia) 07/23/2023   Malignant neoplasm of prostate (HCC) 11/04/2022   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   Chronic kidney disease, stage 3b (HCC) 11/03/2018   Essential hypertension 11/03/2018   DMII (diabetes mellitus, type 2) (HCC) 11/03/2018   Morbid obesity (HCC) 09/21/2015    Resolved Hospital Problems  No resolved problems to display.     Discharge Instructions     Diet Carb Modified   Complete by: As directed    Increase activity slowly   Complete by: As directed       Allergies as of 07/24/2023       Reactions  Metformin And Related Shortness Of Breath   Pollen Extract Itching, Other (See Comments)   Some wheezing, itchy eyes, runny nose   Simbrinza [brinzolamide-brimonidine] Other (See Comments)   Drainage, redness to eyes        Medication List     STOP taking these medications    POTASSIUM PO       TAKE these medications    aspirin EC 81 MG tablet Take 1 tablet (81 mg  total) by mouth 2 (two) times daily. Swallow whole.   clopidogrel 75 MG tablet Commonly known as: PLAVIX Take 1 tablet (75 mg total) by mouth daily.   docusate sodium 100 MG capsule Commonly known as: COLACE Take 1 capsule (100 mg total) by mouth 2 (two) times daily as needed for mild constipation.   dorzolamide 2 % ophthalmic solution Commonly known as: TRUSOPT Place 1 drop into the right eye 2 (two) times daily.   ezetimibe 10 MG tablet Commonly known as: ZETIA TAKE 1 TABLET(10 MG) BY MOUTH DAILY AFTER SUPPER   FreeStyle Libre 2 Sensor Misc Inject 1 Device into the skin every 14 (fourteen) days.   furosemide 40 MG tablet Commonly known as: LASIX Take 1 tablet (40 mg total) by mouth daily. What changed: when to take this   gabapentin 300 MG capsule Commonly known as: NEURONTIN Take 2 capsules (600 mg total) by mouth 3 (three) times daily. What changed: how much to take   latanoprost 0.005 % ophthalmic solution Commonly known as: XALATAN Place 1 drop into both eyes at bedtime.   losartan 25 MG tablet Commonly known as: COZAAR TAKE 1 TABLET(25 MG) BY MOUTH DAILY   metoprolol tartrate 25 MG tablet Commonly known as: LOPRESSOR Take 1 tablet (25 mg total) by mouth 2 (two) times daily.   Mounjaro 5 MG/0.5ML Pen Generic drug: tirzepatide Inject 5 mg into the skin every Tuesday.   multivitamin with minerals Tabs tablet Take 1 tablet by mouth daily with breakfast.   NovoLIN 70/30 (70-30) 100 UNIT/ML injection Generic drug: insulin NPH-regular Human Inject 50 Units into the skin in the morning and at bedtime.   Oxycodone HCl 10 MG Tabs Take 1 tablet (10 mg total) by mouth every 4 (four) hours as needed for severe pain (pain score 7-10) or moderate pain (pain score 4-6).   polyethylene glycol 17 g packet Commonly known as: MIRALAX / GLYCOLAX Take 17 g by mouth daily as needed.   PRESCRIPTION MEDICATION CPAP- At bedtime   rosuvastatin 10 MG tablet Commonly known  as: CRESTOR Take 10 mg by mouth every Sunday.   tamsulosin 0.4 MG Caps capsule Commonly known as: FLOMAX Take 0.4 mg by mouth daily.   timolol 0.5 % ophthalmic solution Commonly known as: TIMOPTIC Place 1 drop into the right eye 2 (two) times daily.   zolpidem 5 MG tablet Commonly known as: AMBIEN Take 5 mg by mouth at bedtime as needed for sleep.        Contact information for follow-up providers     Sheral Apley, MD. Schedule an appointment as soon as possible for a visit in 2 week(s).   Specialty: Orthopedic Surgery Contact information: [address] [place] [address] [phone]              Contact information for after-discharge care     Destination     [facility] .   Service: Assisted Living Contact information: [address] [place] [address] [phone]  Allergies  Allergen Reactions   Metformin And Related Shortness Of Breath   Pollen Extract Itching and Other (See Comments)    Some wheezing, itchy eyes, runny nose   Simbrinza [Brinzolamide-Brimonidine] Other (See Comments)    Drainage, redness to eyes    Consultations: Orthopedic surgery  Procedures: 07/21/23: LLE ORIF  Discharge Exam: BP 118/70 (BP Location: Right Arm)   Pulse 69   Temp 98.2 F (36.8 C) (Oral)   Resp 15   Ht 5\' 11"  (1.803 m)   Wt 124.1 kg   SpO2 98%   BMI 38.16 kg/m  Physical Exam Constitutional:      General: He is not in acute distress.    Appearance: Normal appearance.  HENT:     Head: Normocephalic and atraumatic.     Mouth/Throat:     Mouth: Mucous membranes are moist.  Eyes:     Extraocular Movements: Extraocular movements intact.  Cardiovascular:     Rate and Rhythm: Normal rate and regular rhythm.  Pulmonary:     Effort: Pulmonary effort is normal. No respiratory distress.     Breath sounds: Normal breath sounds. No wheezing.  Abdominal:      General: Bowel sounds are normal. There is no distension.     Palpations: Abdomen is soft.     Tenderness: There is no abdominal tenderness.  Musculoskeletal:     Cervical back: Normal range of motion and neck supple.     Comments: Left leg/ankle/foot wrapped in surgical bandage; toes exposed and warm well perfused soft upper leg  Skin:    General: Skin is warm and dry.  Neurological:     General: No focal deficit present.     Mental Status: He is alert.  Psychiatric:        Mood and Affect: Mood normal.        Behavior: Behavior normal.      The results of significant diagnostics from this hospitalization (including imaging, microbiology, ancillary and laboratory) are listed below for reference.   Microbiology: No results found for this or any previous visit (from the past 240 hours).   Labs: BNP (last 3 results) Recent Labs    07/21/23 0418  BNP 36.4   Basic Metabolic Panel: Recent Labs  Lab 07/20/23 1449 07/21/23 0418 07/22/23 0413 07/23/23 0447  NA 135 137 132* 134*  K 3.7 3.9 4.3 4.6  CL 100 102 99 101  CO2 28 27 25 27   GLUCOSE 188* 160* 184* 135*  BUN 21 20 24* 28*  CREATININE 1.30* 1.35* 0.98 1.24  CALCIUM 8.4* 8.3* 8.4* 8.3*   Liver Function Tests: No results for input(s): "AST", "ALT", "ALKPHOS", "BILITOT", "PROT", "ALBUMIN" in the last 168 hours. No results for input(s): "LIPASE", "AMYLASE" in the last 168 hours. No results for input(s): "AMMONIA" in the last 168 hours. CBC: Recent Labs  Lab 07/20/23 1449 07/21/23 0418 07/22/23 0413 07/23/23 0447  WBC 7.2 7.2 8.9 8.4  HGB 13.7 13.0 13.3 12.7*  HCT 42.4 39.8 39.6 38.9*  MCV 95.7 97.3 94.7 96.5  PLT 159 152 166 173   Cardiac Enzymes: No results for input(s): "CKTOTAL", "CKMB", "CKMBINDEX", "TROPONINI" in the last 168 hours. BNP: Invalid input(s): "POCBNP" CBG: Recent Labs  Lab 07/23/23 0547 07/23/23 1149 07/23/23 1712 07/23/23 2127 07/24/23 0751  GLUCAP 116* 143* 160* 179* 136*    D-Dimer No results for input(s): "DDIMER" in the last 72 hours. Hgb A1c No results for input(s): "HGBA1C" in the last 72 hours. Lipid Profile No  results for input(s): "CHOL", "HDL", "LDLCALC", "TRIG", "CHOLHDL", "LDLDIRECT" in the last 72 hours. Thyroid function studies No results for input(s): "TSH", "T4TOTAL", "T3FREE", "THYROIDAB" in the last 72 hours.  Invalid input(s): "FREET3" Anemia work up No results for input(s): "VITAMINB12", "FOLATE", "FERRITIN", "TIBC", "IRON", "RETICCTPCT" in the last 72 hours. Urinalysis    Component Value Date/Time   COLORURINE YELLOW 09/01/2021 1758   APPEARANCEUR CLEAR 09/01/2021 1758   LABSPEC 1.011 09/01/2021 1758   PHURINE 6.0 09/01/2021 1758   GLUCOSEU NEGATIVE 09/01/2021 1758   HGBUR SMALL (A) 09/01/2021 1758   BILIRUBINUR NEGATIVE 09/01/2021 1758   KETONESUR NEGATIVE 09/01/2021 1758   PROTEINUR NEGATIVE 09/01/2021 1758   NITRITE NEGATIVE 09/01/2021 1758   LEUKOCYTESUR SMALL (A) 09/01/2021 1758   Sepsis Labs Recent Labs  Lab 07/20/23 1449 07/21/23 0418 07/22/23 0413 07/23/23 0447  WBC 7.2 7.2 8.9 8.4   Microbiology No results found for this or any previous visit (from the past 240 hours).  Procedures/Studies: ECHOCARDIOGRAM COMPLETE Result Date: 07/22/2023    ECHOCARDIOGRAM REPORT   Patient Name:   WINFERD WEASE Date of Exam: 07/22/2023 Medical Rec #:  [id number]       Height:       71.0 in Accession #:    [id number]      Weight:       273.6 lb Date of Birth:  1941-04-28       BSA:          2.409 m Patient Age:    82 years        BP:           118/57 mmHg Patient Gender: M               HR:           79 bpm. Exam Location:  Inpatient Procedure: 2D Echo, Cardiac Doppler, Color Doppler and Intracardiac            Opacification Agent (Both Spectral and Color Flow Doppler were            utilized during procedure). Indications:    CHF  History:        Patient has no prior history of Echocardiogram examinations.                 Risk  Factors:Diabetes, Hypertension and Sleep Apnea.  Sonographer:    Darlys Gales Referring Phys: [id number] SAGAR H JINWALA IMPRESSIONS  1. Left ventricular ejection fraction, by estimation, is 65 to 70%. The left ventricle has normal function. The left ventricle has no regional wall motion abnormalities. There is mild concentric left ventricular hypertrophy. Left ventricular diastolic parameters are indeterminate.  2. Right ventricular systolic function is normal. The right ventricular size is normal.  3. The mitral valve is normal in structure. Trivial mitral valve regurgitation. No evidence of mitral stenosis.  4. The aortic valve is tricuspid. There is mild calcification of the aortic valve. Aortic valve regurgitation is not visualized. Aortic valve sclerosis/calcification is present, without any evidence of aortic stenosis. Conclusion(s)/Recommendation(s): Technically very limit study due to poor sound wave transmission. FINDINGS  Left Ventricle: Left ventricular ejection fraction, by estimation, is 65 to 70%. The left ventricle has normal function. The left ventricle has no regional wall motion abnormalities. Definity contrast agent was given IV to delineate the left ventricular  endocardial borders. The left ventricular internal cavity size was normal in size. There is mild concentric left ventricular hypertrophy. Left ventricular diastolic parameters are indeterminate. Right Ventricle:  The right ventricular size is normal. No increase in right ventricular wall thickness. Right ventricular systolic function is normal. Left Atrium: Left atrial size was not well visualized. Right Atrium: Right atrial size was not well visualized. Pericardium: There is no evidence of pericardial effusion. Mitral Valve: The mitral valve is normal in structure. Mild mitral annular calcification. Trivial mitral valve regurgitation. No evidence of mitral valve stenosis. Tricuspid Valve: The tricuspid valve is normal in structure.  Tricuspid valve regurgitation is trivial. No evidence of tricuspid stenosis. Aortic Valve: The aortic valve is tricuspid. There is mild calcification of the aortic valve. Aortic valve regurgitation is not visualized. Aortic valve sclerosis/calcification is present, without any evidence of aortic stenosis. Aortic valve peak gradient measures 5.9 mmHg. Pulmonic Valve: The pulmonic valve was not well visualized. Pulmonic valve regurgitation is not visualized. No evidence of pulmonic stenosis. Aorta: The aortic root is normal in size and structure. IAS/Shunts: No atrial level shunt detected by color flow Doppler.  LEFT VENTRICLE PLAX 2D LVIDd:         4.00 cm LVIDs:         2.20 cm LV PW:         1.30 cm LV IVS:        1.30 cm LVOT diam:     1.90 cm LVOT Area:     2.84 cm  RIGHT VENTRICLE RV S prime:     9.79 cm/s AORTIC VALVE AV Vmax:      121.00 cm/s AV Peak Grad: 5.9 mmHg  AORTA Ao Root diam: 3.20 cm MITRAL VALVE MV Area (PHT): 2.10 cm     SHUNTS MV E velocity: 103.00 cm/s  Systemic Diam: 1.90 cm MV A velocity: 141.00 cm/s MV E/A ratio:  0.73 Arvilla Meres MD Electronically signed by Arvilla Meres MD Signature Date/Time: 07/22/2023/3:28:21 PM    Final    CT ANKLE LEFT WO CONTRAST Result Date: 07/20/2023 CLINICAL DATA:  Ankle fracture EXAM: CT OF THE LEFT ANKLE WITHOUT CONTRAST TECHNIQUE: Multidetector CT imaging of the left ankle was performed according to the standard protocol. Multiplanar CT image reconstructions were also generated. RADIATION DOSE REDUCTION: This exam was performed according to the departmental dose-optimization program which includes automated exposure control, adjustment of the mA and/or kV according to patient size and/or use of iterative reconstruction technique. COMPARISON:  Radiographs from 07/20/2023 FINDINGS: Bones/Joint/Cartilage Plaster splint in place. Stage 4 Weber B fracture dislocation of the ankle noted with oblique lateral malleolar component, transverse medial malleolar  component, and a small oblique posterior malleolar component. There is about 2 mm distraction at the lateral malleolar component and some minimal comminution distally at the lateral malleolus. The medial malleolar and posterior malleolar components are nondisplaced. Currently tibiotalar alignment is normal. No other regional fractures. Dorsal spurring of the talar head with some chronically fragmented spurring at the dorsal talonavicular articulation. Ligaments Suboptimally assessed by CT. Muscles and Tendons No flexor tendon entrapment.  No significant abnormality identified. Soft tissues Circumferential subcutaneous edema in the ankle, tracking into the dorsum of the foot. Atheromatous vascular calcifications noted. IMPRESSION: 1. Stage 4 Weber B fracture dislocation of the ankle, with oblique lateral malleolar component, transverse medial malleolar component, and a small oblique posterior malleolar component. There is about 2 mm distraction at the lateral malleolar component and some minimal comminution distally at the lateral malleolus. The medial malleolar and posterior malleolar components are nondisplaced. Currently tibiotalar alignment is normal. 2. Circumferential subcutaneous edema in the ankle, tracking into the dorsum of the foot.  3. Atheromatous vascular calcifications. Electronically Signed   By: Gaylyn Rong M.D.   On: 07/20/2023 16:17   DG Ankle Complete Left Result Date: 07/20/2023 CLINICAL DATA:  Ankle fracture EXAM: LEFT ANKLE COMPLETE - 3+ VIEW COMPARISON:  Radiographs from [facility] dated 07/20/2023 at 12:42 p.m. FINDINGS: Interval placement of plaster cast or splint. Weber B trimalleolar fracture dislocation observed with bony detail obscured by the cast; near anatomic positioning of the fracture fragments. IMPRESSION: 1. Interval placement of plaster cast or splint. 2. Weber B trimalleolar fracture dislocation, fragments are in near anatomic alignment. Electronically  Signed   By: Gaylyn Rong M.D.   On: 07/20/2023 16:11   VAS [place] ABI WITH/WO TBI Result Date: 07/15/2023  LOWER EXTREMITY DOPPLER STUDY Patient Name:  ASPEN DETERDING  Date of Exam:   07/15/2023 Medical Rec #: [id number]        Accession #:    [id number] Date of Birth: 08/01/1940        Patient Gender: M Patient Age:   18 years Exam Location:  [facility] Procedure:      VAS [place] ABI WITH/WO TBI Referring Phys: Yates Decamp --------------------------------------------------------------------------------  Indications: Peripheral artery disease. Patient has absent pedal pulses on the              left and is colder than the right although there is no evidence of              critical limb ischemia. History of left SFA angioplasty in 2018. He              denies claudication symptoms but endorses leg weakness and needs              frequent use of a wheelchair due to this. High Risk         Hypertension, hyperlipidemia, Diabetes, past history of Factors:          smoking.  Comparison       Previous ABIs 06/24/19 were 0.77 on the right and 0.85 on the Study:           left. Performing Technologist: Olegario Shearer RVT  Examination Guidelines: A complete evaluation includes at minimum, Doppler waveform signals and systolic blood pressure reading at the level of bilateral brachial, anterior tibial, and posterior tibial arteries, when vessel segments are accessible. Bilateral testing is considered an integral part of a complete examination. Photoelectric Plethysmograph (PPG) waveforms and toe systolic pressure readings are included as required and additional duplex testing as needed. Limited examinations for reoccurring indications may be performed as noted.  ABI Findings: +---------+------------------+-----+-----------+---------+ Right    Rt Pressure (mmHg)IndexWaveform   Comment   +---------+------------------+-----+-----------+---------+ Brachial 162                                          +---------+------------------+-----+-----------+---------+ PTA                             absent     inaudible +---------+------------------+-----+-----------+---------+ PERO     138               0.85 multiphasic          +---------+------------------+-----+-----------+---------+ DP       139               0.86 monophasic           +---------+------------------+-----+-----------+---------+  Great Toe82                0.51 Abnormal             +---------+------------------+-----+-----------+---------+ +---------+------------------+-----+-------------------+---------+ Left     Lt Pressure (mmHg)IndexWaveform           Comment   +---------+------------------+-----+-------------------+---------+ Brachial 150                                                 +---------+------------------+-----+-------------------+---------+ PTA      115               0.71 dampened monophasic          +---------+------------------+-----+-------------------+---------+ PERO     81                0.50 dampened monophasic          +---------+------------------+-----+-------------------+---------+ DP                              absent             inaudible +---------+------------------+-----+-------------------+---------+ Great Toe43                0.27 Abnormal                     +---------+------------------+-----+-------------------+---------+ +-------+-----------+-----------+------------+------------+ ABI/TBIToday's ABIToday's TBIPrevious ABIPrevious TBI +-------+-----------+-----------+------------+------------+ Right  0.86       0.51       0.77                     +-------+-----------+-----------+------------+------------+ Left   0.71       0.27       0.85                     +-------+-----------+-----------+------------+------------+ Right ABIs appear increased compared to prior study on 06/24/19. Left ABIs appear decreased compared to prior study on 06/24/19.   Summary: Right: Resting right ankle-brachial index indicates mild right lower extremity arterial disease. The right toe-brachial index is abnormal. Left: Resting left ankle-brachial index indicates moderate left lower extremity arterial disease. The left toe-brachial index is abnormal. *See table(s) above for measurements and observations.  Electronically signed by Charlton Haws MD on 07/15/2023 at 12:16:08 PM.    Final      Time coordinating discharge: Over 30 minutes    Andrew Chamber, MD  [facility] 07/24/2023, 10:18 AM

## 2023-07-24 NOTE — Plan of Care (Signed)

## 2023-07-24 NOTE — TOC Transition Note (Signed)
 Transition of Care [facility]) - Discharge Note   Patient Details  Name: Andrew Larsen MRN: [id number] Date of Birth: 04/18/1941  [facility] CM/SW Contact:  Jessie Foot, RN Phone Number: 07/24/2023, 2:05 PM   Clinical Narrative:    Patient will DC to: [facility]  Anticipated DC date: 07/24/2023 Family notified: [facility] [id number]   Per MD patient ready for DC to [facility], [place] . RN to call report prior to discharge [phone] Rm 109). RN, patient, patient's family, and facility notified of DC. Discharge Summary and FL2 sent to facility. DC packet on chart includes sign proscription. Ambulance transport requested for patient at 2:05 PM  Nurse Case Manager will sign off for now. Please consult [place] again if new needs arise.     Final next level of care: Skilled Nursing Facility Barriers to Discharge: Barriers Resolved   Patient Goals and CMS Choice Patient states their goals for this hospitalization and ongoing recovery are:: Go to SNF than home CMS Medicare.gov Compare Post Acute Care list provided to:: Patient Represenative (must comment) (Patient and Lorelle Formosa (daughter)) Choice offered to / list presented to : Patient, Adult Children [place] ownership interest in [facility].provided to:: Parent NA    Discharge Placement   Existing PASRR number confirmed : 07/24/23          Patient chooses bed at: [facility] Patient to be transferred to facility by: [facility] Name of family member notified: [facility] Patient and family notified of of transfer: 07/24/23  Discharge Plan and Services Additional resources added to the After Visit Summary for     Discharge Planning Services: CM Consult            DME Arranged: N/A DME Agency: NA       HH Arranged: NA HH Agency: NA        Social Drivers of Health (SDOH) Interventions SDOH Screenings   Food Insecurity: No Food Insecurity (07/20/2023)  Housing: Low Risk   (07/20/2023)  Transportation Needs: No Transportation Needs (07/20/2023)  Utilities: Not At Risk (07/20/2023)  Alcohol Screen: Low Risk  (10/31/2022)  Depression (PHQ2-9): Low Risk  (10/31/2022)  Social Connections: Moderately Integrated (07/20/2023)  Tobacco Use: Medium Risk (07/20/2023)     Readmission Risk Interventions     No data to display

## 2023-07-24 NOTE — Progress Notes (Signed)
 Called report to [facility] and spoke to [place], [place].

## 2023-07-24 NOTE — [place]2 (Signed)
 [place] MEDICAID FL2 LEVEL OF CARE FORM     IDENTIFICATION  Patient Name: Andrew Larsen Birthdate: 1941/03/03 Sex: male Admission Date (Current Location): 07/20/2023  [facility] and [place] Number:  Producer, television/film/video and Address:  [facility], [address], [place], [place] [address]      Provider Number: [id number]  Attending Physician Name and Address:  Lewie Chamber, MD  Relative Name and Phone Number:       Current Level of Care: Hospital Recommended Level of Care: Skilled Nursing Facility Prior Approval Number:    Date Approved/Denied:   PASRR Number: [id number]  Discharge Plan: SNF    Current Diagnoses: Patient Active Problem List   Diagnosis Date Noted   PAD (peripheral artery disease) (HCC) 07/23/2023   OSA (obstructive sleep apnea) 07/23/2023   BPH (benign prostatic hyperplasia) 07/23/2023   Trimalleolar fracture of ankle, closed, left, initial encounter 07/20/2023   Malignant neoplasm of prostate (HCC) 11/04/2022   Sepsis secondary to UTI (HCC) 09/06/2021   Complicated UTI (urinary tract infection) 09/06/2021   Severe sepsis (HCC) 09/01/2021   Cough 01/07/2019   Labile blood glucose    SOB (shortness of breath)    Urinary frequency    Hypoalbuminemia due to protein-calorie malnutrition (HCC)    Supplemental oxygen dependent    Controlled type 2 diabetes mellitus with hyperglycemia (HCC)    Stage 3a chronic kidney disease (CKD) (HCC)    Benign essential HTN    Physical debility 12/01/2018   Pressure injury of skin 11/27/2018   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   Chronic kidney disease, stage 3b (HCC) 11/03/2018   DMII (diabetes mellitus, type 2) (HCC) 11/03/2018   Essential hypertension 11/03/2018   Claudication in peripheral vascular disease (HCC) 06/10/2016   Morbid obesity (HCC) 09/21/2015   Upper airway cough syndrome 09/20/2015   Cough variant asthma 09/20/2015    Orientation RESPIRATION BLADDER Height &  Weight     Self, Time, Situation, Place  Normal Continent Weight: 124.1 kg Height:  5\' 11"  (180.3 cm)  BEHAVIORAL SYMPTOMS/MOOD NEUROLOGICAL BOWEL NUTRITION STATUS      Continent Diet  AMBULATORY STATUS COMMUNICATION OF NEEDS Skin   Limited Assist Verbally Normal, Surgical wounds                       Personal Care Assistance Level of Assistance  Bathing, Dressing Bathing Assistance: Limited assistance   Dressing Assistance: Limited assistance     Functional Limitations Info             SPECIAL CARE FACTORS FREQUENCY  PT (By licensed PT), OT (By licensed OT)     PT Frequency: 5X weekly OT Frequency: 5X weekly            Contractures Contractures Info: Not present    Additional Factors Info  Code Status, Allergies, Insulin Sliding Scale Code Status Info: FULL Allergies Info: Metformin And Related, Pollen Extract, Simbrinza (Brinzolamide-brimonidine)   Insulin Sliding Scale Info: insulin aspart (novoLOG) injection 0-15 Units 0-15 Units, Subcutaneous, 3 times daily with meals, First dose on Sat 07/20/23 at 1700  Correction coverage: Moderate (average weight, post-op)  CBG < 70: Implement Hypoglycemia Standing Orders and refer to Hypoglycemia Standing Orders sidebar report  CBG 70 - 120: 0 units  CBG 121 - 150: 2 units  CBG 151 - 200: 3 units  CBG 201 - 250: 5 units  CBG 251 - 300: 8 units  CBG 301 - 350: 11  units  CBG 351 - 400: 15 units       Current Medications (07/24/2023):  This is the current hospital active medication list Current Facility-Administered Medications  Medication Dose Route Frequency Provider Last Rate Last Admin   acetaminophen (TYLENOL) tablet 325-650 mg  325-650 mg Oral Q6H PRN Gawne, Meghan M, PA-C       aspirin EC tablet 81 mg  81 mg Oral BID Levester Fresh M, PA-C   81 mg at 07/24/23 8657   clopidogrel (PLAVIX) tablet 75 mg  75 mg Oral Daily Norva Pavlov, RPH   75 mg at 07/24/23 0944   diphenhydrAMINE (BENADRYL) 12.5 MG/5ML elixir  12.5-25 mg  12.5-25 mg Oral Q4H PRN Levester Fresh M, PA-C       docusate sodium (COLACE) capsule 100 mg  100 mg Oral BID Levester Fresh M, PA-C   100 mg at 07/24/23 0944   dorzolamide (TRUSOPT) 2 % ophthalmic solution 1 drop  1 drop Right Eye BID Levester Fresh M, PA-C   1 drop at 07/24/23 8469   ezetimibe (ZETIA) tablet 10 mg  10 mg Oral Daily Levester Fresh M, PA-C   10 mg at 07/24/23 6295   gabapentin (NEURONTIN) capsule 600 mg  600 mg Oral TID Jenne Pane, PA-C   600 mg at 07/24/23 2841   HYDROmorphone (DILAUDID) injection 0.5-1 mg  0.5-1 mg Intravenous Q4H PRN Jenne Pane, PA-C   1 mg at 07/24/23 3244   insulin aspart (novoLOG) injection 0-15 Units  0-15 Units Subcutaneous TID WC Jenne Pane, PA-C   2 Units at 07/24/23 0102   insulin glargine (LANTUS) injection 20 Units  20 Units Subcutaneous QHS Jenne Pane, PA-C   20 Units at 07/23/23 2113   latanoprost (XALATAN) 0.005 % ophthalmic solution 1 drop  1 drop Both Eyes QHS Levester Fresh M, PA-C   1 drop at 07/23/23 2113   losartan (COZAAR) tablet 25 mg  25 mg Oral Daily Levester Fresh M, PA-C   25 mg at 07/24/23 7253   methocarbamol (ROBAXIN) tablet 500 mg  500 mg Oral Q6H PRN Jenne Pane, PA-C   500 mg at 07/24/23 6644   Or   methocarbamol (ROBAXIN) injection 500 mg  500 mg Intravenous Q6H PRN Gawne, Lindie Spruce M, PA-C       metoCLOPramide (REGLAN) tablet 5-10 mg  5-10 mg Oral Q8H PRN Gawne, Meghan M, PA-C       Or   metoCLOPramide (REGLAN) injection 5-10 mg  5-10 mg Intravenous Q8H PRN Gawne, Meghan M, PA-C       metoprolol tartrate (LOPRESSOR) tablet 25 mg  25 mg Oral BID Levester Fresh M, PA-C   25 mg at 07/24/23 0944   ondansetron (ZOFRAN) tablet 4 mg  4 mg Oral Q6H PRN Levester Fresh M, PA-C       Or   ondansetron [facility]) injection 4 mg  4 mg Intravenous Q6H PRN Gawne, Meghan M, PA-C       oxyCODONE (Oxy IR/ROXICODONE) immediate release tablet 10 mg  10 mg Oral Q4H PRN Levester Fresh M, PA-C   10 mg at 07/24/23 0820    oxyCODONE (Oxy IR/ROXICODONE) immediate release tablet 5 mg  5 mg Oral Q4H PRN Levester Fresh M, PA-C   5 mg at 07/22/23 1825   pantoprazole (PROTONIX) EC tablet 40 mg  40 mg Oral Daily Levester Fresh M, PA-C   40 mg at 07/24/23 0944   polyethylene glycol (MIRALAX / GLYCOLAX) packet 17  g  17 g Oral Daily Kathrynn Running, MD   17 g at 07/23/23 0944   rosuvastatin (CRESTOR) tablet 10 mg  10 mg Oral Daily Levester Fresh M, PA-C   10 mg at 07/24/23 0981   senna-docusate (Senokot-S) tablet 1 tablet  1 tablet Oral QHS PRN Levester Fresh M, PA-C       tamsulosin [facility]) capsule 0.4 mg  0.4 mg Oral Daily Levester Fresh M, PA-C   0.4 mg at 07/24/23 0944   timolol (TIMOPTIC) 0.5 % ophthalmic solution 1 drop  1 drop Right Eye BID Levester Fresh M, PA-C   1 drop at 07/24/23 1914     Discharge Medications: Please see discharge summary for a list of discharge medications. aspirin EC 81 MG tablet Take 1 tablet (81 mg total) by mouth 2 (two) times daily. Swallow whole.    clopidogrel 75 MG tablet Commonly known as: PLAVIX Take 1 tablet (75 mg total) by mouth daily.    docusate sodium 100 MG capsule Commonly known as: COLACE Take 1 capsule (100 mg total) by mouth 2 (two) times daily as needed for mild constipation.    dorzolamide 2 % ophthalmic solution Commonly known as: TRUSOPT Place 1 drop into the right eye 2 (two) times daily.    ezetimibe 10 MG tablet Commonly known as: ZETIA TAKE 1 TABLET(10 MG) BY MOUTH DAILY AFTER SUPPER    FreeStyle Libre 2 Sensor Misc Inject 1 Device into the skin every 14 (fourteen) days.    furosemide 40 MG tablet Commonly known as: LASIX Take 1 tablet (40 mg total) by mouth daily. What changed: when to take this    gabapentin 300 MG capsule Commonly known as: NEURONTIN Take 2 capsules (600 mg total) by mouth 3 (three) times daily. What changed: how much to take    latanoprost 0.005 % ophthalmic solution Commonly known as: XALATAN Place 1 drop into both eyes  at bedtime.    losartan 25 MG tablet Commonly known as: COZAAR TAKE 1 TABLET(25 MG) BY MOUTH DAILY    metoprolol tartrate 25 MG tablet Commonly known as: LOPRESSOR Take 1 tablet (25 mg total) by mouth 2 (two) times daily.    Mounjaro 5 MG/0.5ML Pen Generic drug: tirzepatide Inject 5 mg into the skin every Tuesday.    multivitamin with minerals Tabs tablet Take 1 tablet by mouth daily with breakfast.    NovoLIN 70/30 (70-30) 100 UNIT/ML injection Generic drug: insulin NPH-regular Human Inject 50 Units into the skin in the morning and at bedtime.    Oxycodone HCl 10 MG Tabs Take 1 tablet (10 mg total) by mouth every 4 (four) hours as needed for severe pain (pain score 7-10) or moderate pain (pain score 4-6).    polyethylene glycol 17 g packet Commonly known as: MIRALAX / GLYCOLAX Take 17 g by mouth daily as needed.    PRESCRIPTION MEDICATION CPAP- At bedtime    rosuvastatin 10 MG tablet Commonly known as: CRESTOR Take 10 mg by mouth every Sunday.    tamsulosin 0.4 MG Caps capsule Commonly known as: FLOMAX Take 0.4 mg by mouth daily.    timolol 0.5 % ophthalmic solution Commonly known as: TIMOPTIC Place 1 drop into the right eye 2 (two) times daily.    zolpidem 5 MG tablet Commonly known as: AMBIEN Take 5 mg by mouth at bedtime as needed for sleep.   Relevant Imaging Results:  Relevant Lab Results:   Additional Information SS#  [id number]  Jessie Foot, RN

## 2023-07-26 ENCOUNTER — Non-Acute Institutional Stay (SKILLED_NURSING_FACILITY): Payer: Self-pay | Admitting: Student

## 2023-07-26 ENCOUNTER — Encounter: Payer: Self-pay | Admitting: Student

## 2023-07-26 DIAGNOSIS — I739 Peripheral vascular disease, unspecified: Secondary | ICD-10-CM | POA: Diagnosis not present

## 2023-07-26 DIAGNOSIS — E1165 Type 2 diabetes mellitus with hyperglycemia: Secondary | ICD-10-CM | POA: Diagnosis not present

## 2023-07-26 DIAGNOSIS — G7241 Inclusion body myositis [IBM]: Secondary | ICD-10-CM

## 2023-07-26 DIAGNOSIS — N1832 Chronic kidney disease, stage 3b: Secondary | ICD-10-CM | POA: Diagnosis not present

## 2023-07-26 DIAGNOSIS — S82852A Displaced trimalleolar fracture of left lower leg, initial encounter for closed fracture: Secondary | ICD-10-CM

## 2023-07-26 DIAGNOSIS — I10 Essential (primary) hypertension: Secondary | ICD-10-CM | POA: Diagnosis not present

## 2023-07-26 DIAGNOSIS — N400 Enlarged prostate without lower urinary tract symptoms: Secondary | ICD-10-CM

## 2023-07-26 DIAGNOSIS — J849 Interstitial pulmonary disease, unspecified: Secondary | ICD-10-CM | POA: Diagnosis not present

## 2023-07-26 DIAGNOSIS — F419 Anxiety disorder, unspecified: Secondary | ICD-10-CM | POA: Diagnosis not present

## 2023-07-26 DIAGNOSIS — K5900 Constipation, unspecified: Secondary | ICD-10-CM

## 2023-07-26 NOTE — Progress Notes (Addendum)
 Provider:  Coralyn Helling, M.D. Location:  Other [facility]) Nursing Home Room Number: 109 A Place of Service:  SNF (31)  PCP: Alysia Penna, MD Patient Care Team: Alysia Penna, MD as PCP - General (Internal Medicine) Alysia Penna, MD (Internal Medicine) Cherlyn Cushing, RN as Oncology Nurse Navigator  Extended Emergency Contact Information Primary Emergency Contact: [facility] Address: [address]          [place], [place] [address] D[place] of [place] Home Phone: [phone] Mobile Phone: [phone] Relation: Daughter  Code Status: DNR Goals of Care: Advanced Directive information    07/20/2023    5:00 PM  Advanced Directives  Does Patient Have a Medical Advance Directive? Yes  Type of Advance Directive Living will  Does patient want to make changes to medical advance directive? No - Patient declined     Chief Complaint  Patient presents with   New Admit To SNF    New admission to [facility]     HPI: Patient is a 83 y.o. male seen today for admission to [facility]. 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 He is accompanied by his daughter and son-in-law.  He experienced a mechanical fall on July 20, 2023, while walking into the kitchen, resulting in a trimalleolar fracture of the ankle. Surgery was performed on July 21, 2023. He has significant pain in the left foot and is currently non-weight bearing. He has a history of multiple falls since being diagnosed with inclusion body myositis in 2014, which has contributed to his fall risk. He has participated in physical therapy in the past for fall prevention.  He reports constipation, having not had a bowel movement for four days, which he attributes to the use of strong pain medications post-surgery. He was given prune juice without effect and has used Miralax in  the past with some success. He is currently using Colace as needed for constipation.  His medication regimen includes aspirin, Plavix, Colace, Zetia, Lasix, gabapentin, losartan, metoprolol, Novolin 70/30, oxycodone, Crestor, Flomax, and eye drops for glaucoma. He takes gabapentin 600 mg three times a day for neuropathy. He also takes melatonin for sleep and has previously used Ambien for anxiety-related insomnia but has not refilled it.  He lives with his daughter and son-in-law, who assist with meal preparation, though he manages his personal care independently. He is retired from [employer] and drives occasionally. He has a history of COVID-19 in 2020, which led to complications including congestive heart failure and kidney issues. Past Medical History:  Diagnosis Date   Arthritis    CKD (chronic kidney disease)    CKD stage III (01/2018)   Daytime somnolence    Diabetes mellitus without complication (HCC)    Fracture    right fibula/wears boot cast   GERD (gastroesophageal reflux disease)    Glaucoma    Hypercholesteremia    Hyperlipidemia    Hypertension    Mold exposure    7 yrs ago   PAD (peripheral artery disease) (HCC)    Prostate cancer (HCC)    Sleep apnea    uses cpap   Uses walker    Wears glasses    Past Surgical History:  Procedure Laterality Date   GOLD SEED IMPLANT N/A 01/08/2023   Procedure: GOLD SEED IMPLANT;  Surgeon: Despina Arias, MD;  Location: [facility];  Service:  Urology;  Laterality: N/A;  30 MINUTES   LOWER EXTREMITY ANGIOGRAPHY N/A 06/12/2016   Procedure: Lower Extremity Angiography;  Surgeon: Yates Decamp, MD;  Location: [facility] INVASIVE CV LAB;  Service: Cardiovascular;  Laterality: N/A;   MUSCLE BIOPSY Right 05/06/2018   Procedure: right vastus lateralis muscle biopsy;  Surgeon: Julio Sicks, MD;  Location: [facility] OR;  Service: Neurosurgery;  Laterality: Right;   ORIF ANKLE FRACTURE Right 10/02/2012   Procedure: OPEN REDUCTION INTERNAL FIXATION  (ORIF) ANKLE FRACTURE;  Surgeon: Cammy Copa, MD;  Location: [facility] ORS;  Service: Orthopedics;  Laterality: Right;   ORIF ANKLE FRACTURE Left 07/21/2023   Procedure: OPEN REDUCTION INTERNAL FIXATION (ORIF) ANKLE FRACTURE;  Surgeon: Sheral Apley, MD;  Location: [facility] ORS;  Service: Orthopedics;  Laterality: Left;   PERIPHERAL VASCULAR ATHERECTOMY Left 06/12/2016   Procedure: Peripheral Vascular Atherectomy-Left Popliteal;  Surgeon: Yates Decamp, MD;  Location: [facility] INVASIVE CV LAB;  Service: Cardiovascular;  Laterality: Left;   PERIPHERAL VASCULAR INTERVENTION Left 06/12/2016   Procedure: Peripheral Vascular Intervention- DCB Left Popliteal;  Surgeon: Yates Decamp, MD;  Location: [facility] INVASIVE CV LAB;  Service: Cardiovascular;  Laterality: Left;   PROSTATE BIOPSY     SPACE OAR INSTILLATION N/A 01/08/2023   Procedure: SPACE OAR INSTILLATION;  Surgeon: Despina Arias, MD;  Location: [facility];  Service: Urology;  Laterality: N/A;    reports that he quit smoking about 41 years ago. His smoking use included cigarettes. He started smoking about 61 years ago. He has a 20 pack-year smoking history. He has never used smokeless tobacco. He reports that he does not drink alcohol and does not use drugs. Social History   Socioeconomic History   Marital status: Widowed    Spouse name: Not on file   Number of children: 1   Years of education: Not on file   Highest education level: Not on file  Occupational History   Not on file  Tobacco Use   Smoking status: Former    Current packs/day: 0.00    Average packs/day: 1 pack/day for 20.0 years (20.0 ttl pk-yrs)    Types: Cigarettes    Start date: 08/22/1961    Quit date: 08/22/1981    Years since quitting: 41.9   Smokeless tobacco: Never  Vaping Use   Vaping status: Never Used  Substance and Sexual Activity   Alcohol use: No    Alcohol/week: 0.0 standard drinks of alcohol   Drug use: No   Sexual activity: Never  Other Topics Concern    Not on file  Social History Narrative   ** Merged History Encounter **       Social Drivers of Health   Financial Resource Strain: Not on file  Food Insecurity: No Food Insecurity (07/20/2023)   Hunger Vital Sign    Worried About Running Out of Food in the Last Year: Never true    Ran Out of Food in the Last Year: Never true  Transportation Needs: No Transportation Needs (07/20/2023)   PRAPARE - Administrator, Civil Service (Medical): No    Lack of Transportation (Non-Medical): No  Physical Activity: Not on file  Stress: Not on file  Social Connections: Moderately Integrated (07/20/2023)   Social Connection and Isolation Panel [NHANES]    Frequency of Communication with Friends and Family: More than three times a week    Frequency of Social Gatherings with Friends and Family: More than three times a week    Attends Religious Services: More than  4 times per year    Active Member of Clubs or Organizations: Yes    Attends Banker Meetings: 1 to 4 times per year    Marital Status: Widowed  Intimate Partner Violence: Not At Risk (07/20/2023)   Humiliation, Afraid, Rape, and Kick questionnaire    Fear of Current or Ex-Partner: No    Emotionally Abused: No    Physically Abused: No    Sexually Abused: No    Functional Status Survey:    Family History  Problem Relation Age of Onset   Heart disease Father    Prostate cancer Brother    Cancer Brother    Diabetes Brother    Colon cancer Neg Hx    Sleep apnea Neg Hx     Health Maintenance  Topic Date Due   FOOT EXAM  Never done   OPHTHALMOLOGY EXAM  Never done   Diabetic kidney evaluation - Urine ACR  Never done   COVID-19 Vaccine (8 - 2024-25 season) 03/30/2024 (Originally 06/13/2023)   HEMOGLOBIN A1C  01/21/2024   Medicare Annual Wellness (AWV)  03/18/2024   Diabetic kidney evaluation - eGFR measurement  07/22/2024   DTaP/Tdap/Td (3 - Td or Tdap) 08/09/2032   Pneumonia Vaccine 25+ Years old  Completed    INFLUENZA VACCINE  Completed   Zoster Vaccines- Shingrix  Completed   HPV VACCINES  Aged Out    Allergies  Allergen Reactions   Metformin And Related Shortness Of Breath   Pollen Extract Itching and Other (See Comments)    Some wheezing, itchy eyes, runny nose   Simbrinza [Brinzolamide-Brimonidine] Other (See Comments)    Drainage, redness to eyes    Outpatient Encounter Medications as of 07/26/2023  Medication Sig   aspirin EC 81 MG tablet Take 1 tablet (81 mg total) by mouth 2 (two) times daily. Swallow whole.   clopidogrel (PLAVIX) 75 MG tablet Take 1 tablet (75 mg total) by mouth daily.   docusate sodium (COLACE) 100 MG capsule Take 1 capsule (100 mg total) by mouth 2 (two) times daily as needed for mild constipation.   dorzolamide (TRUSOPT) 2 % ophthalmic solution Place 1 drop into the right eye 2 (two) times daily.   ezetimibe (ZETIA) 10 MG tablet TAKE 1 TABLET(10 MG) BY MOUTH DAILY AFTER SUPPER   furosemide (LASIX) 40 MG tablet Take 1 tablet (40 mg total) by mouth daily.   gabapentin (NEURONTIN) 300 MG capsule Take 2 capsules (600 mg total) by mouth 3 (three) times daily.   latanoprost (XALATAN) 0.005 % ophthalmic solution Place 1 drop into both eyes at bedtime.   losartan (COZAAR) 25 MG tablet TAKE 1 TABLET(25 MG) BY MOUTH DAILY   metoprolol tartrate (LOPRESSOR) 25 MG tablet Take 1 tablet (25 mg total) by mouth 2 (two) times daily.   Multiple Vitamin (MULTIVITAMIN WITH MINERALS) TABS tablet Take 1 tablet by mouth daily with breakfast.   NOVOLIN 70/30 (70-30) 100 UNIT/ML injection Inject 50 Units into the skin in the morning and at bedtime.   Oxycodone HCl 10 MG TABS Take 1 tablet (10 mg total) by mouth every 4 (four) hours as needed.   polyethylene glycol (MIRALAX / GLYCOLAX) 17 g packet Take 17 g by mouth daily as needed.   rosuvastatin (CRESTOR) 10 MG tablet Take 10 mg by mouth every Sunday.   tamsulosin (FLOMAX) 0.4 MG CAPS capsule Take 0.4 mg by mouth daily.   timolol  (TIMOPTIC) 0.5 % ophthalmic solution Place 1 drop into the right eye 2 (  two) times daily.   zolpidem (AMBIEN) 5 MG tablet Take 5 mg by mouth at bedtime as needed for sleep.   Continuous Blood Gluc Sensor (FREESTYLE LIBRE 2 SENSOR) MISC Inject 1 Device into the skin every 14 (fourteen) days.   MOUNJARO 5 MG/0.5ML Pen Inject 5 mg into the skin every Tuesday. (Patient not taking: Reported on 07/26/2023)   PRESCRIPTION MEDICATION CPAP- At bedtime   No facility-administered encounter medications on file as of 07/26/2023.    Review of Systems  Vitals:   07/26/23 0834  BP: (!) 165/86  Pulse: 89  Resp: 20  Temp: 97.6 F (36.4 C)  SpO2: 95%  Weight: 262 lb (118.8 kg)  Height: 5\' 11"  (1.803 m)   Body mass index is 36.54 kg/m. Physical Exam Constitutional:      Appearance: Normal appearance.  Cardiovascular:     Rate and Rhythm: Normal rate and regular rhythm.     Pulses: Normal pulses.     Heart sounds: Normal heart sounds.  Pulmonary:     Effort: Pulmonary effort is normal.  Abdominal:     General: Abdomen is flat. Bowel sounds are normal.     Palpations: Abdomen is soft.  Musculoskeletal:        General: No swelling or tenderness.     Comments: Left foot in soft cast  Skin:    General: Skin is warm and dry.     Capillary Refill: Capillary refill takes 2 to 3 seconds.  Neurological:     Mental Status: He is alert and oriented to person, place, and time.     Gait: Gait normal.  Psychiatric:        Mood and Affect: Mood normal.     Labs reviewed: Basic Metabolic Panel: Recent Labs    07/21/23 0418 07/22/23 0413 07/23/23 0447  NA 137 132* 134*  K 3.9 4.3 4.6  CL 102 99 101  CO2 27 25 27   GLUCOSE 160* 184* 135*  BUN 20 24* 28*  CREATININE 1.35* 0.98 1.24  CALCIUM 8.3* 8.4* 8.3*   Liver Function Tests: No results for input(s): "AST", "ALT", "ALKPHOS", "BILITOT", "PROT", "ALBUMIN" in the last 8760 hours. No results for input(s): "LIPASE", "AMYLASE" in the last 8760  hours. No results for input(s): "AMMONIA" in the last 8760 hours. CBC: Recent Labs    07/21/23 0418 07/22/23 0413 07/23/23 0447  WBC 7.2 8.9 8.4  HGB 13.0 13.3 12.7*  HCT 39.8 39.6 38.9*  MCV 97.3 94.7 96.5  PLT 152 166 173   Cardiac Enzymes: No results for input(s): "CKTOTAL", "CKMB", "CKMBINDEX", "TROPONINI" in the last 8760 hours. BNP: Invalid input(s): "POCBNP" Lab Results  Component Value Date   HGBA1C 7.3 (H) 07/21/2023   Lab Results  Component Value Date   TSH 3.910 07/13/2019   No results found for: "VITAMINB12" No results found for: "FOLATE" Lab Results  Component Value Date   FERRITIN 199 11/27/2018    Imaging and Procedures obtained prior to SNF admission: CT ANKLE LEFT WO CONTRAST Result Date: 07/20/2023 CLINICAL DATA:  Ankle fracture EXAM: CT OF THE LEFT ANKLE WITHOUT CONTRAST TECHNIQUE: Multidetector CT imaging of the left ankle was performed according to the standard protocol. Multiplanar CT image reconstructions were also generated. RADIATION DOSE REDUCTION: This exam was performed according to the departmental dose-optimization program which includes automated exposure control, adjustment of the mA and/or kV according to patient size and/or use of iterative reconstruction technique. COMPARISON:  Radiographs from 07/20/2023 FINDINGS: Bones/Joint/Cartilage Plaster splint in place. Stage  4 Weber B fracture dislocation of the ankle noted with oblique lateral malleolar component, transverse medial malleolar component, and a small oblique posterior malleolar component. There is about 2 mm distraction at the lateral malleolar component and some minimal comminution distally at the lateral malleolus. The medial malleolar and posterior malleolar components are nondisplaced. Currently tibiotalar alignment is normal. No other regional fractures. Dorsal spurring of the talar head with some chronically fragmented spurring at the dorsal talonavicular articulation. Ligaments  Suboptimally assessed by CT. Muscles and Tendons No flexor tendon entrapment.  No significant abnormality identified. Soft tissues Circumferential subcutaneous edema in the ankle, tracking into the dorsum of the foot. Atheromatous vascular calcifications noted. IMPRESSION: 1. Stage 4 Weber B fracture dislocation of the ankle, with oblique lateral malleolar component, transverse medial malleolar component, and a small oblique posterior malleolar component. There is about 2 mm distraction at the lateral malleolar component and some minimal comminution distally at the lateral malleolus. The medial malleolar and posterior malleolar components are nondisplaced. Currently tibiotalar alignment is normal. 2. Circumferential subcutaneous edema in the ankle, tracking into the dorsum of the foot. 3. Atheromatous vascular calcifications. Electronically Signed   By: Gaylyn Rong M.D.   On: 07/20/2023 16:17   DG Ankle Complete Left Result Date: 07/20/2023 CLINICAL DATA:  Ankle fracture EXAM: LEFT ANKLE COMPLETE - 3+ VIEW COMPARISON:  Radiographs from [facility] dated 07/20/2023 at 12:42 p.m. FINDINGS: Interval placement of plaster cast or splint. Weber B trimalleolar fracture dislocation observed with bony detail obscured by the cast; near anatomic positioning of the fracture fragments. IMPRESSION: 1. Interval placement of plaster cast or splint. 2. Weber B trimalleolar fracture dislocation, fragments are in near anatomic alignment. Electronically Signed   By: Gaylyn Rong M.D.   On: 07/20/2023 16:11    Assessment/Plan Trimalleolar fracture of the ankle Sustained a trimalleolar fracture of the ankle following a mechanical fall on July 20, 2023. Surgery was performed on July 21, 2023. Currently non-weight bearing on the left foot and experiencing significant pain, managed with oxycodone. Reports improvement in pain and swelling, indicating some recovery progress. Pain management is crucial to  facilitate participation in physical therapy. - Continue non-weight bearing status on the left foot. - Administer oxycodone 10 mg every 4 hours as needed for pain, with a plan to reassess and potentially decrease the dose on Monday to minimize sedating side effects and constipation. - Encourage participation in physical therapy to aid recovery.  Constipation Has not had a bowel movement in four days, likely exacerbated by the use of strong pain medications postoperatively. Prune juice was ineffective. Concern for potential ileus if not addressed. Miralax and Senna are typical postoperative interventions to prevent ileus and promote bowel movements. - Administer Miralax daily. - Administer Senna nightly. - Discontinue Colace. - Monitor bowel movements and adjust treatment if stools become too loose.  Inclusion body myositis Inclusion body myositis diagnosed in 2014, contributing to frequent falls. The condition is chronic and progressive with no cure, impacting mobility and increasing fall risk. Supportive care and physical therapy are essential to manage symptoms and improve quality of life. - Continue monitoring and supportive care for inclusion body myositis. - Encourage physical therapy to improve mobility and reduce fall risk.  Insomnia  High risk medication Discussed the risk and benefit of this medication. Continue melatonin at this time. Consider trazodone prn if issues arise.  Anxiety Never treated, however, mind races at night. Consider sertraline for anxiety if patient continues to have symptoms.  Hypertension Blood pressure was elevated upon arrival but later stabilized to 125/74 mmHg. Currently on losartan 25 mg daily and metoprolol 25 mg twice daily. Regular monitoring is necessary to ensure stability. - Continue losartan 25 mg daily. - Continue metoprolol 25 mg twice daily. - Monitor blood pressure regularly.  Chronic kidney disease (CKD) CKD requires careful management  of medications, particularly those processed through the kidneys, such as gabapentin. Gabapentin dose reduction may be considered if kidney function declines to protect renal health. - Continue current dose of gabapentin 600 mg three times a day, with consideration for dose reduction if kidney function declines. - Monitor kidney function regularly.  Diabetes Well-controlled at this time. Insulin dependent. Continue 70/30 50 units BID.  BPH NO symptoms at this time, continue Flomax.   ILD  Stable on room air at this time.   Goals of Care Expressed preference for no resuscitation in the event of cardiac arrest but is open to hospitalization if necessary for other medical issues. Values quality of life and prefers to avoid aggressive interventions that do not align with this goal. - Document preference for no resuscitation (DNR status). - Ensure daughter is informed and involved in care decisions.   Family/ staff Communication:   Labs/tests ordered:

## 2023-07-27 ENCOUNTER — Encounter: Payer: Self-pay | Admitting: Student

## 2023-07-28 DIAGNOSIS — G4733 Obstructive sleep apnea (adult) (pediatric): Secondary | ICD-10-CM | POA: Diagnosis not present

## 2023-07-29 LAB — BASIC METABOLIC PANEL WITH GFR
BUN: 27 — AB (ref 4–21)
CO2: 29 — AB (ref 13–22)
Chloride: 102 (ref 99–108)
Creatinine: 1.3 (ref 0.6–1.3)
Glucose: 124
Potassium: 4.5 meq/L (ref 3.5–5.1)
Sodium: 136 — AB (ref 137–147)

## 2023-07-29 LAB — CBC AND DIFFERENTIAL
HCT: 37 — AB (ref 41–53)
Hemoglobin: 12.3 — AB (ref 13.5–17.5)
Neutrophils Absolute: 4530
Platelets: 230 10*3/uL (ref 150–400)
WBC: 7.5

## 2023-07-29 LAB — CBC: RBC: 3.9 (ref 3.87–5.11)

## 2023-07-29 LAB — COMPREHENSIVE METABOLIC PANEL WITH GFR
Calcium: 8.1 — AB (ref 8.7–10.7)
eGFR: 56

## 2023-08-06 ENCOUNTER — Other Ambulatory Visit: Payer: Self-pay | Admitting: Nurse Practitioner

## 2023-08-06 MED ORDER — OXYCODONE HCL 10 MG PO TABS
10.0000 mg | ORAL_TABLET | Freq: Four times a day (QID) | ORAL | 0 refills | Status: DC
Start: 2023-08-06 — End: 2023-08-19

## 2023-08-07 DIAGNOSIS — S82852D Displaced trimalleolar fracture of left lower leg, subsequent encounter for closed fracture with routine healing: Secondary | ICD-10-CM | POA: Diagnosis not present

## 2023-08-14 DIAGNOSIS — S82852D Displaced trimalleolar fracture of left lower leg, subsequent encounter for closed fracture with routine healing: Secondary | ICD-10-CM | POA: Diagnosis not present

## 2023-08-19 ENCOUNTER — Other Ambulatory Visit: Payer: Self-pay | Admitting: Student

## 2023-08-19 DIAGNOSIS — S82852A Displaced trimalleolar fracture of left lower leg, initial encounter for closed fracture: Secondary | ICD-10-CM

## 2023-08-19 MED ORDER — OXYCODONE HCL 10 MG PO TABS: 10.0000 mg | ORAL_TABLET | Freq: Four times a day (QID) | ORAL | 0 refills | Status: DC

## 2023-08-19 NOTE — Progress Notes (Signed)
 Refill pain medication s/p fx

## 2023-08-22 NOTE — H&P (Signed)
 PREOPERATIVE H&P  Chief Complaint: ABSCESS LEFT ANKLE, STATUS POST ORIF  HPI: Andrew Larsen is a 83 y.o. male who presents with a diagnosis of ABSCESS LEFT ANKLE, STATUS POST ORIF. Symptoms are rated as moderate to severe, and have been worsening.  This is significantly impairing activities of daily living.  He has elected for surgical management.   Past Medical History:  Diagnosis Date   Arthritis    CKD (chronic kidney disease)    CKD stage III (01/2018)   Daytime somnolence    Diabetes mellitus without complication (HCC)    Fracture    right fibula/wears boot cast   GERD (gastroesophageal reflux disease)    Glaucoma    Hypercholesteremia    Hyperlipidemia    Hypertension    Mold exposure    7 yrs ago   PAD (peripheral artery disease) (HCC)    Prostate cancer (HCC)    Sleep apnea    uses cpap   Uses walker    Wears glasses    Past Surgical History:  Procedure Laterality Date   GOLD SEED IMPLANT N/A 01/08/2023   Procedure: GOLD SEED IMPLANT;  Surgeon: Mallie Seal, MD;  Location: [facility];  Service: Urology;  Laterality: N/A;  30 MINUTES   LOWER EXTREMITY ANGIOGRAPHY N/A 06/12/2016   Procedure: Lower Extremity Angiography;  Surgeon: Knox Perl, MD;  Location: [facility] INVASIVE CV LAB;  Service: Cardiovascular;  Laterality: N/A;   MUSCLE BIOPSY Right 05/06/2018   Procedure: right vastus lateralis muscle biopsy;  Surgeon: Agustina Aldrich, MD;  Location: [facility] OR;  Service: Neurosurgery;  Laterality: Right;   ORIF ANKLE FRACTURE Right 10/02/2012   Procedure: OPEN REDUCTION INTERNAL FIXATION (ORIF) ANKLE FRACTURE;  Surgeon: Jasmine Mesi, MD;  Location: [facility] ORS;  Service: Orthopedics;  Laterality: Right;   ORIF ANKLE FRACTURE Left 07/21/2023   Procedure: OPEN REDUCTION INTERNAL FIXATION (ORIF) ANKLE FRACTURE;  Surgeon: Saundra Curl, MD;  Location: [facility] ORS;  Service: Orthopedics;  Laterality: Left;   PERIPHERAL VASCULAR ATHERECTOMY Left 06/12/2016   Procedure:  Peripheral Vascular Atherectomy-Left Popliteal;  Surgeon: Knox Perl, MD;  Location: [facility] INVASIVE CV LAB;  Service: Cardiovascular;  Laterality: Left;   PERIPHERAL VASCULAR INTERVENTION Left 06/12/2016   Procedure: Peripheral Vascular Intervention- DCB Left Popliteal;  Surgeon: Knox Perl, MD;  Location: [facility] INVASIVE CV LAB;  Service: Cardiovascular;  Laterality: Left;   PROSTATE BIOPSY     SPACE OAR INSTILLATION N/A 01/08/2023   Procedure: SPACE OAR INSTILLATION;  Surgeon: Mallie Seal, MD;  Location: [facility];  Service: Urology;  Laterality: N/A;   Social History   Socioeconomic History   Marital status: Widowed    Spouse name: Not on file   Number of children: 1   Years of education: Not on file   Highest education level: Not on file  Occupational History   Not on file  Tobacco Use   Smoking status: Former    Current packs/day: 0.00    Average packs/day: 1 pack/day for 20.0 years (20.0 ttl pk-yrs)    Types: Cigarettes    Start date: 08/22/1961    Quit date: 08/22/1981    Years since quitting: 42.0   Smokeless tobacco: Never  Vaping Use   Vaping status: Never Used  Substance and Sexual Activity   Alcohol  use: No    Alcohol /week: 0.0 standard drinks of alcohol    Drug use: No   Sexual activity: Never  Other Topics Concern   Not on file  Social  History Narrative   ** Merged History Encounter **       Social Drivers of Corporate investment banker Strain: Not on file  Food Insecurity: No Food Insecurity (07/20/2023)   Hunger Vital Sign    Worried About Running Out of Food in the Last Year: Never true    Ran Out of Food in the Last Year: Never true  Transportation Needs: No Transportation Needs (07/20/2023)   PRAPARE - Administrator, Civil Service (Medical): No    Lack of Transportation (Non-Medical): No  Physical Activity: Not on file  Stress: Not on file  Social Connections: Moderately Integrated (07/20/2023)   Social Connection and Isolation  Panel [NHANES]    Frequency of Communication with Friends and Family: More than three times a week    Frequency of Social Gatherings with Friends and Family: More than three times a week    Attends Religious Services: More than 4 times per year    Active Member of [employer] or Organizations: Yes    Attends Banker Meetings: 1 to 4 times per year    Marital Status: Widowed   Family History  Problem Relation Age of Onset   Heart disease Father    Prostate cancer Brother    Cancer Brother    Diabetes Brother    Colon cancer Neg Hx    Sleep apnea Neg Hx    Allergies  Allergen Reactions   Metformin And Related Shortness Of Breath   Pollen Extract Itching and Other (See Comments)    Some wheezing, itchy eyes, runny nose   Simbrinza [Brinzolamide-Brimonidine ] Other (See Comments)    Drainage, redness to eyes   Prior to Admission medications   Medication Sig Start Date End Date Taking? Authorizing Provider  aspirin  EC 81 MG tablet Take 1 tablet (81 mg total) by mouth 2 (two) times daily. Swallow whole. 07/24/23   Faith Homes, MD  clopidogrel  (PLAVIX ) 75 MG tablet Take 1 tablet (75 mg total) by mouth daily. 07/05/23   Knox Perl, MD  Continuous Blood Gluc Sensor (FREESTYLE LIBRE 2 SENSOR) MISC Inject 1 Device into the skin every 14 (fourteen) days. 08/22/21   [provider]  docusate sodium  (COLACE) 100 MG capsule Take 1 capsule (100 mg total) by mouth 2 (two) times daily as needed for mild constipation. 07/24/23   Faith Homes, MD  dorzolamide  (TRUSOPT ) 2 % ophthalmic solution Place 1 drop into the right eye 2 (two) times daily.    [provider]  ezetimibe  (ZETIA ) 10 MG tablet TAKE 1 TABLET(10 MG) BY MOUTH DAILY AFTER SUPPER 03/19/23   Knox Perl, MD  furosemide  (LASIX ) 40 MG tablet Take 1 tablet (40 mg total) by mouth daily. 12/15/18   Angiulli, Everlyn Hockey, PA-C  gabapentin  (NEURONTIN ) 300 MG capsule Take 2 capsules (600 mg total) by mouth 3 (three) times daily.  12/15/18   Angiulli, Everlyn Hockey, PA-C  latanoprost  (XALATAN ) 0.005 % ophthalmic solution Place 1 drop into both eyes at bedtime.    [provider]  losartan  (COZAAR ) 25 MG tablet TAKE 1 TABLET(25 MG) BY MOUTH DAILY 10/05/19   Knox Perl, MD  metoprolol  tartrate (LOPRESSOR ) 25 MG tablet Take 1 tablet (25 mg total) by mouth 2 (two) times daily. 12/15/18   Angiulli, Daniel J, PA-C  Multiple Vitamin (MULTIVITAMIN WITH MINERALS) TABS tablet Take 1 tablet by mouth daily with breakfast.    [provider]  NOVOLIN 70/30 (70-30) 100 UNIT/ML injection Inject 50 Units into  the skin in the morning and at bedtime. 07/03/23   [provider]  Oxycodone  HCl 10 MG TABS Take 1 tablet (10 mg total) by mouth every 6 (six) hours. 08/19/23   Valrie Gehrig, MD  polyethylene glycol (MIRALAX  / GLYCOLAX ) 17 g packet Take 17 g by mouth daily as needed. 07/24/23   Faith Homes, MD  PRESCRIPTION MEDICATION CPAP- At bedtime    [provider]  rosuvastatin  (CRESTOR ) 10 MG tablet Take 10 mg by mouth every Sunday. 05/26/23   [provider]  tamsulosin  (FLOMAX ) 0.4 MG CAPS capsule Take 0.4 mg by mouth daily.    [provider]  timolol  (TIMOPTIC ) 0.5 % ophthalmic solution Place 1 drop into the right eye 2 (two) times daily. 01/30/19   [provider]  tirzepatide (MOUNJARO) 2.5 MG/0.5ML Pen 2.5mg  Subcutaneous once aweek for 28 days 03/13/23   [provider]     Positive ROS: All other systems have been reviewed and were otherwise negative with the exception of those mentioned in the HPI and as above.  Physical Exam: General: Alert, no acute distress Cardiovascular: pedal edema Respiratory: No cyanosis, no use of accessory musculature GI: No organomegaly, abdomen is soft and non-tender Skin: No lesions in the area of chief complaint Neurologic: Sensation intact distally Psychiatric: Patient is competent for consent with normal mood and affect Lymphatic:  No axillary or cervical lymphadenopathy  MUSCULOSKELETAL: TTP left ankle, maceration to lateral ankle, drainage from incision, severe edema present to entire lower leg through foot, erythema present, limited ROM, NVI to baseline level   Imaging: xrays show stable left ankle ORIF with no hardware failure   Assessment: ABSCESS LEFT ANKLE, STATUS POST ORIF  Plan: Plan for Procedure(s): INCISION AND DRAINAGE OF DEEP ABSCESS, ANKLE CREATION, FLAP, ROTATION REMOVAL, HARDWARE APPLICATION, WOUND VAC APPLICATION, ALLOGRAFT, SKIN  The risks benefits and alternatives were discussed with the patient including but not limited to the risks of nonoperative treatment, versus surgical intervention including infection, bleeding, nerve injury,  blood clots, cardiopulmonary complications, morbidity, mortality, among others, and they were willing to proceed.   Weightbearing: NWB LLE Orthopedic devices: splint Showering: POD 3 Dressing: reinforce PRN Medicines: ASA, Oxy, Tylenol , Zofran   Discharge: admit Follow up: 09/02/23 at 3:30pm    Ander Bame Office [phone] 08/22/2023 12:31 PM

## 2023-08-26 ENCOUNTER — Encounter (HOSPITAL_COMMUNITY): Payer: Self-pay | Admitting: Orthopedic Surgery

## 2023-08-26 ENCOUNTER — Other Ambulatory Visit: Payer: Self-pay

## 2023-08-26 ENCOUNTER — Non-Acute Institutional Stay (SKILLED_NURSING_FACILITY): Payer: Self-pay | Admitting: Student

## 2023-08-26 DIAGNOSIS — E1165 Type 2 diabetes mellitus with hyperglycemia: Secondary | ICD-10-CM | POA: Diagnosis not present

## 2023-08-26 DIAGNOSIS — I10 Essential (primary) hypertension: Secondary | ICD-10-CM

## 2023-08-26 DIAGNOSIS — I739 Peripheral vascular disease, unspecified: Secondary | ICD-10-CM

## 2023-08-26 DIAGNOSIS — N1832 Chronic kidney disease, stage 3b: Secondary | ICD-10-CM

## 2023-08-26 DIAGNOSIS — K5900 Constipation, unspecified: Secondary | ICD-10-CM

## 2023-08-26 DIAGNOSIS — S82852A Displaced trimalleolar fracture of left lower leg, initial encounter for closed fracture: Secondary | ICD-10-CM

## 2023-08-26 NOTE — Progress Notes (Signed)
 Anesthesia Chart Review: SAME DAY WORK-UP  Case: [id number] Date/Time: 08/27/23 0945   Procedures:      INCISION AND DRAINAGE OF DEEP ABSCESS, ANKLE (Left)     CREATION, FLAP, ROTATION (Left)     REMOVAL, HARDWARE (Left)     APPLICATION, WOUND VAC (Left)     APPLICATION, ALLOGRAFT, SKIN (Left)   Anesthesia type: Choice   Pre-op diagnosis: ABSCESS LEFT ANKLE, STATUS POST ORIF   [facility]: [facility] ROOM 12 / [facility]   Surgeons: Saundra Curl, MD       DISCUSSION: Patient is an 82 year old male scheduled for the above procedure.   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  Last neurology follow-up 01/29/23 with Clem Currier, NP primarily for OSA follow-up, but also noted previous neurology evaluation at [facility] by Suzanna Erp, MD, last visit there 03/11/19 for inclusion body myositis (IBM). It sounds like he was unclear about follow-up and did not think there were many treatment options, so has not been back.  He prefers to set up neurology follow-up for this locally. He does have know muscle weakness with some falls, so requested a referral for PT. In the meantime, Dr. Omar Bibber plans to discuss with one of their neuromuscular specialists.  Last cardiology follow-up was on 07/05/23 with Dr. Berry Bristol for PAD.  Not on statin due to history of inclusive body myositis.  Tolerating Zetia .  Denied claudication symptoms although difficult to assess given mobility restrictions.  He has known marked weakness in his lower extremities needing frequent use of wheelchair.  ABI ordered and done on 07/15/23 and were stable since 2021.    He was discharged to [facility] on 07/24/23 after hospitalization for mechanical fall with left trimalleolar fracture, s/p ORIF 07/21/23.  He had orthopedic follow-up on 08/21/23 and surgery recommended for left ankle abscess. Kelly at [facility] office did reach out to cardiologist Dr. Berry Bristol about surgery plans. She sent me copies of communication from Dr. Berry Bristol stating, "Stop [Plavix ] for 5 days and restart when appropriate. He is on it for PAD only no recent stents." He was instructed to hold Plavix  at that appointment.   He has not been on Mounjao since SNF/Rehab in March. Last ASA and Plavix  08/21/23.   Anesthesia team to evaluate on the day of surgery.   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 Plan was for baseline CTD serologies and PFTs. Not felt to be a candidate for rehab due to issues with balance and weakness from myositis.   LABS: For day of surgery as indicated. Last results in [facility] include: Lab Results  Component Value Date   WBC 8.4 07/23/2023   HGB 12.7 (L) 07/23/2023   HCT  38.9 (L) 07/23/2023   PLT 173 07/23/2023   GLUCOSE 135 (H) 07/23/2023   NA 134 (L) 07/23/2023   K 4.6 07/23/2023   CL 101 07/23/2023   CREATININE 1.24 07/23/2023   BUN 28 (H) 07/23/2023   CO2 27 07/23/2023   INR 1.0 07/21/2023   HGBA1C 7.3 (H) 07/21/2023      EKG: 07/20/23: Sinus rhythm Prolonged PR interval, PR 225 ms   CV: Echo 07/22/23: IMPRESSIONS   1. Left ventricular ejection fraction, by  estimation, is 65 to 70%. The  left ventricle has normal function. The left ventricle has no regional  wall motion abnormalities. There is mild concentric left ventricular  hypertrophy. Left ventricular diastolic  parameters are indeterminate.   2. Right ventricular systolic function is normal. The right ventricular  size is normal.   3. The mitral valve is normal in structure. Trivial mitral valve  regurgitation. No evidence of mitral stenosis.   4. The aortic valve is tricuspid. There is mild calcification of the  aortic valve. Aortic valve regurgitation is not visualized. Aortic valve  sclerosis/calcification is present, without any evidence of aortic  stenosis.  - Conclusion(s)/Recommendation(s): Technically very limit study due to poor  sound wave transmission.    ABIs 07/15/23: Summary:  - Right: Resting right ankle-brachial index indicates mild right lower  extremity arterial disease. The right toe-brachial index is abnormal.  - Left: Resting left ankle-brachial index indicates moderate left lower  extremity arterial disease. The left toe-brachial index is abnormal.    Nuclear stress test 06/15/19: Conclusions: Nondiagnostic ECG stress. Myocardial perfusion is normal. Stress LVEF 59%. No change from 05/15/2023.  Low restudy.   Past Medical History:  Diagnosis Date   Arthritis    CKD (chronic kidney disease)    CKD stage III (01/2018)   Daytime somnolence    Diabetes mellitus without complication (HCC)    Fracture    right fibula/wears boot cast   GERD (gastroesophageal reflux disease)    Glaucoma    Hypercholesteremia    Hyperlipidemia    Hypertension    Mold exposure    7 yrs ago   PAD (peripheral artery disease) (HCC)    Prostate cancer (HCC)    Sleep apnea    uses cpap   Uses walker    Wears glasses     Past Surgical History:  Procedure Laterality Date   GOLD SEED IMPLANT N/A 01/08/2023   Procedure: GOLD SEED IMPLANT;  Surgeon: Mallie Seal, MD;   Location: [facility];  Service: Urology;  Laterality: N/A;  30 MINUTES   LOWER EXTREMITY ANGIOGRAPHY N/A 06/12/2016   Procedure: Lower Extremity Angiography;  Surgeon: Knox Perl, MD;  Location: [facility] INVASIVE CV LAB;  Service: Cardiovascular;  Laterality: N/A;   MUSCLE BIOPSY Right 05/06/2018   Procedure: right vastus lateralis muscle biopsy;  Surgeon: Agustina Aldrich, MD;  Location: [facility] OR;  Service: Neurosurgery;  Laterality: Right;   ORIF ANKLE FRACTURE Right 10/02/2012   Procedure: OPEN REDUCTION INTERNAL FIXATION (ORIF) ANKLE FRACTURE;  Surgeon: Jasmine Mesi, MD;  Location: [facility];  Service: Orthopedics;  Laterality: Right;   ORIF ANKLE FRACTURE Left 07/21/2023   Procedure: OPEN REDUCTION INTERNAL FIXATION (ORIF) ANKLE FRACTURE;  Surgeon: Saundra Curl, MD;  Location: [facility];  Service: Orthopedics;  Laterality: Left;   PERIPHERAL VASCULAR ATHERECTOMY Left 06/12/2016   Procedure: Peripheral Vascular Atherectomy-Left Popliteal;  Surgeon: Knox Perl, MD;  Location: [facility] INVASIVE CV LAB;  Service: Cardiovascular;  Laterality: Left;  PERIPHERAL VASCULAR INTERVENTION Left 06/12/2016   Procedure: Peripheral Vascular Intervention- DCB Left Popliteal;  Surgeon: Knox Perl, MD;  [facility];  Service: Cardiovascular;  Laterality: Left;   PROSTATE BIOPSY     SPACE OAR INSTILLATION N/A 01/08/2023   Procedure: SPACE OAR INSTILLATION;  Surgeon: Mallie Seal, MD;  Location: [facility];  Service: Urology;  Laterality: N/A;    MEDICATIONS: No current facility-administered medications for this encounter.    clopidogrel  (PLAVIX ) 75 MG tablet   aspirin  EC 81 MG tablet   Continuous Blood Gluc Sensor (FREESTYLE LIBRE 2 SENSOR) MISC   docusate sodium  (COLACE) 100 MG capsule   dorzolamide  (TRUSOPT ) 2 % ophthalmic solution   ezetimibe  (ZETIA ) 10 MG tablet   furosemide  (LASIX ) 40 MG tablet   gabapentin  (NEURONTIN ) 300 MG capsule   latanoprost  (XALATAN ) 0.005  % ophthalmic solution   losartan  (COZAAR ) 25 MG tablet   metoprolol  tartrate (LOPRESSOR ) 25 MG tablet   Multiple Vitamin (MULTIVITAMIN WITH MINERALS) TABS tablet   NOVOLIN 70/30 (70-30) 100 UNIT/ML injection   Oxycodone  HCl 10 MG TABS   polyethylene glycol (MIRALAX  / GLYCOLAX ) 17 g packet   PRESCRIPTION MEDICATION   rosuvastatin  (CRESTOR ) 10 MG tablet   tamsulosin  (FLOMAX ) 0.4 MG CAPS capsule   timolol  (TIMOPTIC ) 0.5 % ophthalmic solution   tirzepatide Florence Hunt) 2.5 MG/0.5ML Pen    Ella Gun, PA-C Surgical Short Stay/Anesthesiology [facility] Phone [phone] [facility] Phone [phone] 08/26/2023 4:46 PM

## 2023-08-26 NOTE — Progress Notes (Addendum)
 PCP - Dr. Barnetta Liberty Cardiologist - Dr. Knox Perl  PPM/ICD - denies   Chest x-ray - 09/03/21 EKG - 07/20/23 Stress Test - 06/15/19 ECHO - 07/22/23 Cardiac Cath - denies  CPAP - OSA+, CPAP nightly, unsure of pressure settings  Fasting Blood Sugar - 100-110 Checks Blood Sugar 3-4 times/day Pt has continuous sensor on left arm   Pt has not taken Mounjaro since he has been at the facility (arrived in March)  Blood Thinner Instructions: Plavix  last dose 4/23 (hold 5 days) Aspirin  Instructions: ASA- held with Plavix - last dose 4/23.  ERAS Protcol - clears until 0700  COVID TEST- n/a  Anesthesia review: yes, cardiac hx  Patient verbally denies any shortness of breath, fever, cough and chest pain during phone call     Questions were answered for pt's nurse Todd. Todd verbalized understanding of instructions.   Instructions reviewed with Ena Harries, RN and daughter Adriana Hopping. Instructions faxed to [facility] at [phone]

## 2023-08-26 NOTE — Anesthesia Preprocedure Evaluation (Signed)
 Anesthesia Evaluation  Patient identified by MRN, date of birth, ID band Patient awake    Reviewed: Allergy  & Precautions, H&P , NPO status , Patient's Chart, lab work & pertinent test results  Airway Mallampati: II   Neck ROM: full    Dental   Pulmonary asthma , sleep apnea , former smoker   breath sounds clear to auscultation       Cardiovascular hypertension, + Peripheral Vascular Disease   Rhythm:regular Rate:Normal     Neuro/Psych negative neurological ROS  negative psych ROS   GI/Hepatic Neg liver ROS,GERD  ,,  Endo/Other  diabetes, Type 2    Renal/GU CRFRenal disease  negative genitourinary   Musculoskeletal  (+) Arthritis ,  Left ankle fx s/p ORIF   Abdominal   Peds negative pediatric ROS (+)  Hematology negative hematology ROS (+)   Anesthesia Other Findings   Reproductive/Obstetrics negative OB ROS                              Anesthesia Physical Anesthesia Plan  ASA: 3  Anesthesia Plan: General   Post-op Pain Management:    Induction: Intravenous  PONV Risk Score and Plan: 2 and Ondansetron , Dexamethasone  and Treatment may vary due to age or medical condition  Airway Management Planned: Oral ETT  Additional Equipment:   Intra-op Plan:   Post-operative Plan: Extubation in OR  Informed Consent: I have reviewed the patients History and Physical, chart, labs and discussed the procedure including the risks, benefits and alternatives for the proposed anesthesia with the patient or authorized representative who has indicated his/her understanding and acceptance.     Dental advisory given  Plan Discussed with: CRNA, Anesthesiologist and Surgeon  Anesthesia Plan Comments: (PAT note written 08/26/2023 by Kaisha Wachob, PA-C.  )        Anesthesia Quick Evaluation

## 2023-08-26 NOTE — Pre-Procedure Instructions (Signed)
 Surgery Instructions: ATTN: TODD  Your procedure is scheduled on 4/29.  Report to [facility] Main Entrance "A" at 07:30 A.M., and check in at the Admitting office.  Any questions or running late day of surgery: call [phone]    Remember:  Do not eat after midnight the night before your surgery  You may drink clear liquids until 07:00 AM the morning of your surgery.   Clear liquids allowed are: Water, Non-Citrus Juices (without pulp), Carbonated Beverages, Clear Tea, Black Coffee Only, and Gatorade    Take these medicines the morning of surgery with A SIP OF WATER  Gabapentin  Metoprolol  Flomax  Timolol    May take these medicines IF NEEDED: Oxycodone  HCl    Hold Plavix  5 days prior to surgery. Last dose 4/23. Follow your surgeon's instructions on when to stop Aspirin .  If no instructions were given by your surgeon then you will need to call the office to get those instructions.     As of today, STOP taking any Aleve, Naproxen, Ibuprofen, Motrin, Advil, Goody's, BC's, all herbal medications, fish oil, and all vitamins.  WHAT DO I DO ABOUT MY DIABETES MEDICATION?  THE NIGHT BEFORE SURGERY, take 35 units (70%) of NOVOLIN 70/30      THE MORNING OF SURGERY, do NOT take NOVOLIN 70/30.   HOW TO MANAGE YOUR DIABETES BEFORE AND AFTER SURGERY  Why is it important to control my blood sugar before and after surgery? Improving blood sugar levels before and after surgery helps healing and can limit problems. A way of improving blood sugar control is eating a healthy diet by:  Eating less sugar and carbohydrates  Increasing activity/exercise  Talking with your doctor about reaching your blood sugar goals High blood sugars (greater than 180 mg/dL) can raise your risk of infections and slow your recovery, so you will need to focus on controlling your diabetes during the weeks before surgery. Make sure that the doctor who takes care of your diabetes knows about your planned surgery  including the date and location.  How do I manage my blood sugar before surgery? Check your blood sugar at least 4 times a day, starting 2 days before surgery, to make sure that the level is not too high or low.  Check your blood sugar the morning of your surgery when you wake up and every 2 hours until you get to the Short Stay unit.  If your blood sugar is less than 70 mg/dL, you will need to treat for low blood sugar: Do not take insulin . Treat a low blood sugar (less than 70 mg/dL) with  cup of clear juice (cranberry or apple), 4 glucose tablets, OR glucose gel. Recheck blood sugar in 15 minutes after treatment (to make sure it is greater than 70 mg/dL). If your blood sugar is not greater than 70 mg/dL on recheck, call [phone] for further instructions. Report your blood sugar to the short stay nurse when you get to Short Stay.  If you are admitted to the hospital after surgery: Your blood sugar will be checked by the staff and you will probably be given insulin  after surgery (instead of oral diabetes medicines) to make sure you have good blood sugar levels. The goal for blood sugar control after surgery is 80-180 mg/dL.   Do NOT Smoke (Tobacco/Vaping) 24 hours prior to your procedure  If you use a CPAP at night, you may bring all equipment for your overnight stay.     You will be asked to remove any  contacts, glasses, piercing's, hearing aid's, dentures/partials prior to surgery. Please bring cases for these items if needed.     Patients discharged the day of surgery will not be allowed to drive home, and someone needs to stay with them for 24 hours.  SURGICAL WAITING ROOM VISITATION Patients may have no more than 2 support people in the waiting area - these visitors may rotate.   Pre-op nurse will coordinate an appropriate time for 1 ADULT support person, who may not rotate, to accompany patient in pre-op.  Children under the age of 68 must have an adult with them who is not  the patient and must remain in the main waiting area with an adult.  If the patient needs to stay at the hospital during part of their recovery, the visitor guidelines for inpatient rooms apply.  Please refer to the [facility] website for the visitor guidelines for any additional information.   Special instructions:   [place]- Preparing For Surgery   Please follow these instructions carefully.   Shower the NIGHT BEFORE SURGERY and the MORNING OF SURGERY with DIAL Soap.   Pat yourself dry with a CLEAN TOWEL.  Wear CLEAN PAJAMAS to bed the night before surgery  Place CLEAN SHEETS on your bed the night of your first shower and DO NOT SLEEP WITH PETS.   Additional instructions for the day of surgery: DO NOT APPLY any lotions, deodorants, cologne, or perfumes.   Do not wear jewelry or makeup Do not wear nail polish, gel polish, artificial nails, or any other type of covering on natural nails (fingers and toes) Do not bring valuables to the hospital. [facility] is not responsible for valuables/personal belongings. Put on clean/comfortable clothes.  Please brush your teeth.  Ask your nurse before applying any prescription medications to the skin.   PLEASE MAKE SURE YOU SEND A COPY OF ALL MEDICATIONS WITH PATIENT, INCLUDING THE LAST DOSE OF EACH MEDICATION

## 2023-08-27 ENCOUNTER — Encounter: Payer: Self-pay | Admitting: Student

## 2023-08-27 ENCOUNTER — Inpatient Hospital Stay (HOSPITAL_COMMUNITY): Payer: Self-pay | Admitting: Vascular Surgery

## 2023-08-27 ENCOUNTER — Inpatient Hospital Stay (HOSPITAL_COMMUNITY)
Admission: RE | Admit: 2023-08-27 | Discharge: 2023-08-30 | DRG: 464 | Disposition: A | Discharge status: Skilled Nursing Facility | Attending: Orthopedic Surgery | Admitting: Orthopedic Surgery

## 2023-08-27 ENCOUNTER — Encounter (HOSPITAL_COMMUNITY)
Admission: RE | Disposition: A | Discharge status: Skilled Nursing Facility | Payer: Self-pay | Source: Home / Self Care | Attending: Orthopedic Surgery

## 2023-08-27 ENCOUNTER — Other Ambulatory Visit: Payer: Self-pay

## 2023-08-27 ENCOUNTER — Encounter (HOSPITAL_COMMUNITY): Payer: Self-pay | Admitting: Orthopedic Surgery

## 2023-08-27 ENCOUNTER — Inpatient Hospital Stay (HOSPITAL_COMMUNITY)

## 2023-08-27 DIAGNOSIS — I129 Hypertensive chronic kidney disease with stage 1 through stage 4 chronic kidney disease, or unspecified chronic kidney disease: Secondary | ICD-10-CM | POA: Diagnosis not present

## 2023-08-27 DIAGNOSIS — E66812 Obesity, class 2: Secondary | ICD-10-CM | POA: Diagnosis not present

## 2023-08-27 DIAGNOSIS — Z6837 Body mass index (BMI) 37.0-37.9, adult: Secondary | ICD-10-CM

## 2023-08-27 DIAGNOSIS — M00872 Arthritis due to other bacteria, left ankle and foot: Secondary | ICD-10-CM | POA: Diagnosis not present

## 2023-08-27 DIAGNOSIS — E7800 Pure hypercholesterolemia, unspecified: Secondary | ICD-10-CM | POA: Diagnosis not present

## 2023-08-27 DIAGNOSIS — Z87891 Personal history of nicotine dependence: Secondary | ICD-10-CM

## 2023-08-27 DIAGNOSIS — E1151 Type 2 diabetes mellitus with diabetic peripheral angiopathy without gangrene: Secondary | ICD-10-CM | POA: Diagnosis not present

## 2023-08-27 DIAGNOSIS — W19XXXD Unspecified fall, subsequent encounter: Secondary | ICD-10-CM | POA: Diagnosis present

## 2023-08-27 DIAGNOSIS — J849 Interstitial pulmonary disease, unspecified: Secondary | ICD-10-CM | POA: Diagnosis present

## 2023-08-27 DIAGNOSIS — E8809 Other disorders of plasma-protein metabolism, not elsewhere classified: Secondary | ICD-10-CM | POA: Diagnosis present

## 2023-08-27 DIAGNOSIS — S82852G Displaced trimalleolar fracture of left lower leg, subsequent encounter for closed fracture with delayed healing: Secondary | ICD-10-CM | POA: Diagnosis not present

## 2023-08-27 DIAGNOSIS — I739 Peripheral vascular disease, unspecified: Secondary | ICD-10-CM | POA: Diagnosis not present

## 2023-08-27 DIAGNOSIS — I1310 Hypertensive heart and chronic kidney disease without heart failure, with stage 1 through stage 4 chronic kidney disease, or unspecified chronic kidney disease: Secondary | ICD-10-CM | POA: Diagnosis present

## 2023-08-27 DIAGNOSIS — I10 Essential (primary) hypertension: Secondary | ICD-10-CM | POA: Diagnosis not present

## 2023-08-27 DIAGNOSIS — K5900 Constipation, unspecified: Secondary | ICD-10-CM | POA: Diagnosis present

## 2023-08-27 DIAGNOSIS — M65072 Abscess of tendon sheath, left ankle and foot: Secondary | ICD-10-CM | POA: Diagnosis not present

## 2023-08-27 DIAGNOSIS — T8469XA Infection and inflammatory reaction due to internal fixation device of other site, initial encounter: Principal | ICD-10-CM | POA: Diagnosis present

## 2023-08-27 DIAGNOSIS — T8454XA Infection and inflammatory reaction due to internal left knee prosthesis, initial encounter: Secondary | ICD-10-CM | POA: Diagnosis not present

## 2023-08-27 DIAGNOSIS — E1169 Type 2 diabetes mellitus with other specified complication: Secondary | ICD-10-CM | POA: Diagnosis present

## 2023-08-27 DIAGNOSIS — J8410 Pulmonary fibrosis, unspecified: Secondary | ICD-10-CM | POA: Diagnosis not present

## 2023-08-27 DIAGNOSIS — Z923 Personal history of irradiation: Secondary | ICD-10-CM

## 2023-08-27 DIAGNOSIS — Z9862 Peripheral vascular angioplasty status: Secondary | ICD-10-CM

## 2023-08-27 DIAGNOSIS — E1122 Type 2 diabetes mellitus with diabetic chronic kidney disease: Secondary | ICD-10-CM | POA: Diagnosis not present

## 2023-08-27 DIAGNOSIS — Z96662 Presence of left artificial ankle joint: Secondary | ICD-10-CM | POA: Diagnosis not present

## 2023-08-27 DIAGNOSIS — I130 Hypertensive heart and chronic kidney disease with heart failure and stage 1 through stage 4 chronic kidney disease, or unspecified chronic kidney disease: Secondary | ICD-10-CM | POA: Diagnosis not present

## 2023-08-27 DIAGNOSIS — T81328A Disruption or dehiscence of closure of other specified internal operation (surgical) wound, initial encounter: Secondary | ICD-10-CM | POA: Diagnosis not present

## 2023-08-27 DIAGNOSIS — M869 Osteomyelitis, unspecified: Secondary | ICD-10-CM | POA: Diagnosis present

## 2023-08-27 DIAGNOSIS — R7881 Bacteremia: Secondary | ICD-10-CM | POA: Diagnosis not present

## 2023-08-27 DIAGNOSIS — N400 Enlarged prostate without lower urinary tract symptoms: Secondary | ICD-10-CM | POA: Diagnosis present

## 2023-08-27 DIAGNOSIS — T8459XA Infection and inflammatory reaction due to other internal joint prosthesis, initial encounter: Secondary | ICD-10-CM | POA: Diagnosis present

## 2023-08-27 DIAGNOSIS — Y831 Surgical operation with implant of artificial internal device as the cause of abnormal reaction of the patient, or of later complication, without mention of misadventure at the time of the procedure: Secondary | ICD-10-CM | POA: Diagnosis present

## 2023-08-27 DIAGNOSIS — E46 Unspecified protein-calorie malnutrition: Secondary | ICD-10-CM | POA: Diagnosis not present

## 2023-08-27 DIAGNOSIS — N1831 Chronic kidney disease, stage 3a: Secondary | ICD-10-CM | POA: Diagnosis present

## 2023-08-27 DIAGNOSIS — E1165 Type 2 diabetes mellitus with hyperglycemia: Secondary | ICD-10-CM | POA: Diagnosis not present

## 2023-08-27 DIAGNOSIS — N1832 Chronic kidney disease, stage 3b: Secondary | ICD-10-CM

## 2023-08-27 DIAGNOSIS — Z5181 Encounter for therapeutic drug level monitoring: Secondary | ICD-10-CM

## 2023-08-27 DIAGNOSIS — Z472 Encounter for removal of internal fixation device: Secondary | ICD-10-CM

## 2023-08-27 DIAGNOSIS — Z794 Long term (current) use of insulin: Secondary | ICD-10-CM

## 2023-08-27 DIAGNOSIS — I509 Heart failure, unspecified: Secondary | ICD-10-CM | POA: Diagnosis not present

## 2023-08-27 DIAGNOSIS — E119 Type 2 diabetes mellitus without complications: Secondary | ICD-10-CM

## 2023-08-27 DIAGNOSIS — M86172 Other acute osteomyelitis, left ankle and foot: Secondary | ICD-10-CM | POA: Diagnosis not present

## 2023-08-27 DIAGNOSIS — R600 Localized edema: Secondary | ICD-10-CM | POA: Diagnosis present

## 2023-08-27 DIAGNOSIS — M96840 Postprocedural hematoma of a musculoskeletal structure following a musculoskeletal system procedure: Secondary | ICD-10-CM | POA: Diagnosis not present

## 2023-08-27 DIAGNOSIS — Z79899 Other long term (current) drug therapy: Secondary | ICD-10-CM

## 2023-08-27 DIAGNOSIS — T8489XA Other specified complication of internal orthopedic prosthetic devices, implants and grafts, initial encounter: Secondary | ICD-10-CM | POA: Diagnosis not present

## 2023-08-27 DIAGNOSIS — Z7985 Long-term (current) use of injectable non-insulin antidiabetic drugs: Secondary | ICD-10-CM

## 2023-08-27 DIAGNOSIS — E1140 Type 2 diabetes mellitus with diabetic neuropathy, unspecified: Secondary | ICD-10-CM | POA: Diagnosis present

## 2023-08-27 DIAGNOSIS — C61 Malignant neoplasm of prostate: Secondary | ICD-10-CM | POA: Diagnosis present

## 2023-08-27 DIAGNOSIS — Z7982 Long term (current) use of aspirin: Secondary | ICD-10-CM

## 2023-08-27 DIAGNOSIS — B9689 Other specified bacterial agents as the cause of diseases classified elsewhere: Secondary | ICD-10-CM | POA: Diagnosis present

## 2023-08-27 DIAGNOSIS — U099 Post covid-19 condition, unspecified: Secondary | ICD-10-CM | POA: Diagnosis present

## 2023-08-27 DIAGNOSIS — Z8546 Personal history of malignant neoplasm of prostate: Secondary | ICD-10-CM

## 2023-08-27 DIAGNOSIS — G4733 Obstructive sleep apnea (adult) (pediatric): Secondary | ICD-10-CM | POA: Diagnosis present

## 2023-08-27 DIAGNOSIS — G7241 Inclusion body myositis [IBM]: Secondary | ICD-10-CM | POA: Diagnosis not present

## 2023-08-27 DIAGNOSIS — Z7902 Long term (current) use of antithrombotics/antiplatelets: Secondary | ICD-10-CM

## 2023-08-27 DIAGNOSIS — T8140XA Infection following a procedure, unspecified, initial encounter: Secondary | ICD-10-CM | POA: Diagnosis not present

## 2023-08-27 HISTORY — PX: APPLICATION OF WOUND VAC: SHX5189

## 2023-08-27 HISTORY — DX: Inclusion body myositis (IBM): G72.41

## 2023-08-27 HISTORY — PX: HARDWARE REMOVAL: SHX979

## 2023-08-27 HISTORY — PX: ALLOGRAFT APPLICATION: SHX6404

## 2023-08-27 HISTORY — PX: ROTATION FLAP: SHX6211

## 2023-08-27 HISTORY — PX: INCISION AND DRAINAGE OF DEEP ABSCESS, ANKLE: SHX7360

## 2023-08-27 HISTORY — DX: Heart failure, unspecified: I50.9

## 2023-08-27 LAB — CBC
HCT: 40.3 % (ref 39.0–52.0)
Hemoglobin: 13.5 g/dL (ref 13.0–17.0)
MCH: 31 pg (ref 26.0–34.0)
MCHC: 33.5 g/dL (ref 30.0–36.0)
MCV: 92.6 fL (ref 80.0–100.0)
Platelets: 184 10*3/uL (ref 150–400)
RBC: 4.35 MIL/uL (ref 4.22–5.81)
RDW: 13.6 % (ref 11.5–15.5)
WBC: 5.3 10*3/uL (ref 4.0–10.5)
nRBC: 0 % (ref 0.0–0.2)

## 2023-08-27 LAB — CREATININE, SERUM
Creatinine, Ser: 1.21 mg/dL (ref 0.61–1.24)
GFR, Estimated: >60 mL/min (ref >60)

## 2023-08-27 LAB — GLUCOSE, CAPILLARY
Glucose-Capillary: 86 mg/dL (ref 70–99)
Glucose-Capillary: 87 mg/dL (ref 70–99)
Glucose-Capillary: 95 mg/dL (ref 70–99)
Glucose-Capillary: 234 mg/dL — ABNORMAL HIGH (ref 70–99)
Glucose-Capillary: 289 mg/dL — ABNORMAL HIGH (ref 70–99)

## 2023-08-27 SURGERY — INCISION AND DRAINAGE OF DEEP ABSCESS, ANKLE
Anesthesia: General | Laterality: Left

## 2023-08-27 MED ORDER — INSULIN ASPART PROT & ASPART (70-30 MIX) 100 UNIT/ML ~~LOC~~ SUSP
25.0000 [IU] | Freq: Two times a day (BID) | SUBCUTANEOUS | Status: DC
  Administered 2023-08-27 – 2023-08-30 (×7): 25 [IU] via SUBCUTANEOUS
  Filled 2023-08-27: qty 10

## 2023-08-27 MED ORDER — ACETAMINOPHEN 10 MG/ML IV SOLN: 1000.0000 mg | Freq: Once | INTRAVENOUS | Status: DC | PRN

## 2023-08-27 MED ORDER — OXYCODONE HCL 5 MG/5ML PO SOLN: 5.0000 mg | Freq: Once | ORAL | Status: DC | PRN

## 2023-08-27 MED ORDER — FENTANYL CITRATE (PF) 250 MCG/5ML IJ SOLN
INTRAMUSCULAR | Status: DC | PRN
  Administered 2023-08-27: 50 ug via INTRAVENOUS
  Administered 2023-08-27: 100 ug via INTRAVENOUS

## 2023-08-27 MED ORDER — EZETIMIBE 10 MG PO TABS
10.0000 mg | ORAL_TABLET | Freq: Every day | ORAL | Status: DC
  Administered 2023-08-27 – 2023-08-30 (×4): 10 mg via ORAL
  Filled 2023-08-27 (×4): qty 1

## 2023-08-27 MED ORDER — SODIUM CHLORIDE 0.9 % IV SOLN
2.0000 g | INTRAVENOUS | Status: DC
  Administered 2023-08-27: 2 g via INTRAVENOUS
  Filled 2023-08-27: qty 20

## 2023-08-27 MED ORDER — DEXTROSE 5 % IV SOLN
INTRAVENOUS | Status: DC | PRN
  Administered 2023-08-27: 3 g via INTRAVENOUS

## 2023-08-27 MED ORDER — HYDROMORPHONE HCL 1 MG/ML IJ SOLN
INTRAMUSCULAR | Status: AC
  Filled 2023-08-27: qty 1

## 2023-08-27 MED ORDER — ONDANSETRON HCL 4 MG PO TABS: 4.0000 mg | ORAL_TABLET | Freq: Four times a day (QID) | ORAL | Status: DC | PRN

## 2023-08-27 MED ORDER — GLYCOPYRROLATE 0.2 MG/ML IJ SOLN
INTRAMUSCULAR | Status: DC | PRN
Start: 2023-08-27 — End: 2023-08-27
  Administered 2023-08-27: .2 mg via INTRAVENOUS

## 2023-08-27 MED ORDER — ORAL CARE MOUTH RINSE: 15.0000 mL | Freq: Once | OROMUCOSAL | Status: AC

## 2023-08-27 MED ORDER — INSULIN ASPART PROT & ASPART (70-30 MIX) 100 UNIT/ML ~~LOC~~ SUSP
30.0000 [IU] | Freq: Two times a day (BID) | SUBCUTANEOUS | Status: DC
  Filled 2023-08-27: qty 10

## 2023-08-27 MED ORDER — TAMSULOSIN HCL 0.4 MG PO CAPS
0.4000 mg | ORAL_CAPSULE | Freq: Every day | ORAL | Status: DC
  Administered 2023-08-28 – 2023-08-30 (×3): 0.4 mg via ORAL
  Filled 2023-08-27 (×3): qty 1

## 2023-08-27 MED ORDER — PHENYLEPHRINE HCL-NACL 20-0.9 MG/250ML-% IV SOLN
INTRAVENOUS | Status: DC | PRN
  Administered 2023-08-27: 40 ug/min via INTRAVENOUS

## 2023-08-27 MED ORDER — ADULT MULTIVITAMIN W/MINERALS CH
1.0000 | ORAL_TABLET | Freq: Every day | ORAL | Status: DC
  Administered 2023-08-28 – 2023-08-30 (×3): 1 via ORAL
  Filled 2023-08-27 (×3): qty 1

## 2023-08-27 MED ORDER — FENTANYL CITRATE (PF) 250 MCG/5ML IJ SOLN
INTRAMUSCULAR | Status: AC
  Filled 2023-08-27: qty 5

## 2023-08-27 MED ORDER — FENTANYL CITRATE (PF) 100 MCG/2ML IJ SOLN
25.0000 ug | INTRAMUSCULAR | Status: DC | PRN
  Administered 2023-08-27 (×3): 50 ug via INTRAVENOUS

## 2023-08-27 MED ORDER — ROSUVASTATIN CALCIUM 5 MG PO TABS
10.0000 mg | ORAL_TABLET | ORAL | Status: DC
Start: 2023-09-01 — End: 2023-08-30

## 2023-08-27 MED ORDER — ROCURONIUM BROMIDE 10 MG/ML (PF) SYRINGE
PREFILLED_SYRINGE | INTRAVENOUS | Status: DC | PRN
  Administered 2023-08-27: 80 mg via INTRAVENOUS

## 2023-08-27 MED ORDER — BISACODYL 10 MG RE SUPP: 10.0000 mg | Freq: Every day | RECTAL | Status: DC | PRN

## 2023-08-27 MED ORDER — POLYETHYLENE GLYCOL 3350 17 G PO PACK: 17.0000 g | PACK | Freq: Every day | ORAL | Status: DC | PRN

## 2023-08-27 MED ORDER — HYDROCODONE-ACETAMINOPHEN 5-325 MG PO TABS: 1.0000 | ORAL_TABLET | ORAL | Status: DC | PRN

## 2023-08-27 MED ORDER — MAGNESIUM CITRATE PO SOLN: 1.0000 | Freq: Once | ORAL | Status: DC | PRN

## 2023-08-27 MED ORDER — PHENYLEPHRINE 80 MCG/ML (10ML) SYRINGE FOR IV PUSH (FOR BLOOD PRESSURE SUPPORT)
PREFILLED_SYRINGE | INTRAVENOUS | Status: DC | PRN
  Administered 2023-08-27 (×3): 160 ug via INTRAVENOUS

## 2023-08-27 MED ORDER — ACETAMINOPHEN 500 MG PO TABS
1000.0000 mg | ORAL_TABLET | Freq: Once | ORAL | Status: AC
  Administered 2023-08-27: 1000 mg via ORAL
  Filled 2023-08-27: qty 2

## 2023-08-27 MED ORDER — ACETAMINOPHEN 325 MG PO TABS: 325.0000 mg | ORAL_TABLET | Freq: Four times a day (QID) | ORAL | Status: DC | PRN

## 2023-08-27 MED ORDER — LATANOPROST 0.005 % OP SOLN
1.0000 [drp] | Freq: Every day | OPHTHALMIC | Status: DC
  Administered 2023-08-27 – 2023-08-29 (×3): 1 [drp] via OPHTHALMIC
  Filled 2023-08-27: qty 2.5

## 2023-08-27 MED ORDER — INSULIN ASPART 100 UNIT/ML IJ SOLN: 0.0000 [IU] | INTRAMUSCULAR | Status: DC | PRN

## 2023-08-27 MED ORDER — METOPROLOL TARTRATE 25 MG PO TABS
25.0000 mg | ORAL_TABLET | Freq: Two times a day (BID) | ORAL | Status: DC
  Administered 2023-08-27 – 2023-08-30 (×6): 25 mg via ORAL
  Filled 2023-08-27 (×6): qty 1

## 2023-08-27 MED ORDER — MORPHINE SULFATE (PF) 2 MG/ML IV SOLN
0.5000 mg | INTRAVENOUS | Status: DC | PRN
  Administered 2023-08-27 – 2023-08-28 (×3): 1 mg via INTRAVENOUS
  Filled 2023-08-27 (×3): qty 1

## 2023-08-27 MED ORDER — HYDROMORPHONE HCL 1 MG/ML IJ SOLN
0.2500 mg | INTRAMUSCULAR | Status: DC | PRN
  Administered 2023-08-27: 0.5 mg via INTRAVENOUS

## 2023-08-27 MED ORDER — POVIDONE-IODINE 10 % EX SWAB
2.0000 | Freq: Once | CUTANEOUS | Status: AC
  Administered 2023-08-27: 2 via TOPICAL

## 2023-08-27 MED ORDER — DORZOLAMIDE HCL 2 % OP SOLN
1.0000 [drp] | Freq: Two times a day (BID) | OPHTHALMIC | Status: DC
  Administered 2023-08-27 – 2023-08-30 (×7): 1 [drp] via OPHTHALMIC
  Filled 2023-08-27: qty 10

## 2023-08-27 MED ORDER — PROPOFOL 10 MG/ML IV BOLUS
INTRAVENOUS | Status: DC | PRN
  Administered 2023-08-27: 120 mg via INTRAVENOUS

## 2023-08-27 MED ORDER — METOCLOPRAMIDE HCL 5 MG PO TABS: 5.0000 mg | ORAL_TABLET | Freq: Three times a day (TID) | ORAL | Status: DC | PRN

## 2023-08-27 MED ORDER — FENTANYL CITRATE (PF) 100 MCG/2ML IJ SOLN
INTRAMUSCULAR | Status: AC
  Filled 2023-08-27: qty 4

## 2023-08-27 MED ORDER — ONDANSETRON HCL 4 MG/2ML IJ SOLN
INTRAMUSCULAR | Status: DC | PRN
  Administered 2023-08-27: 4 mg via INTRAVENOUS

## 2023-08-27 MED ORDER — DIPHENHYDRAMINE HCL 12.5 MG/5ML PO ELIX: 12.5000 mg | ORAL_SOLUTION | ORAL | Status: DC | PRN

## 2023-08-27 MED ORDER — SENNA 8.6 MG PO TABS
1.0000 | ORAL_TABLET | Freq: Two times a day (BID) | ORAL | Status: DC
  Administered 2023-08-27 – 2023-08-28 (×3): 8.6 mg via ORAL
  Filled 2023-08-27 (×3): qty 1

## 2023-08-27 MED ORDER — LIDOCAINE 2% (20 MG/ML) 5 ML SYRINGE
INTRAMUSCULAR | Status: AC
  Filled 2023-08-27: qty 5

## 2023-08-27 MED ORDER — LOSARTAN POTASSIUM 50 MG PO TABS
25.0000 mg | ORAL_TABLET | Freq: Every day | ORAL | Status: DC
  Administered 2023-08-27 – 2023-08-30 (×4): 25 mg via ORAL
  Filled 2023-08-27 (×4): qty 1

## 2023-08-27 MED ORDER — EPHEDRINE SULFATE-NACL 50-0.9 MG/10ML-% IV SOSY
PREFILLED_SYRINGE | INTRAVENOUS | Status: DC | PRN
  Administered 2023-08-27: 10 mg via INTRAVENOUS

## 2023-08-27 MED ORDER — DEXAMETHASONE SODIUM PHOSPHATE 10 MG/ML IJ SOLN
INTRAMUSCULAR | Status: DC | PRN
  Administered 2023-08-27: 5 mg via INTRAVENOUS

## 2023-08-27 MED ORDER — CHLORHEXIDINE GLUCONATE 0.12 % MT SOLN
15.0000 mL | Freq: Once | OROMUCOSAL | Status: AC
  Administered 2023-08-27: 15 mL via OROMUCOSAL

## 2023-08-27 MED ORDER — METOCLOPRAMIDE HCL 5 MG/ML IJ SOLN: 5.0000 mg | Freq: Three times a day (TID) | INTRAMUSCULAR | Status: DC | PRN

## 2023-08-27 MED ORDER — TIMOLOL MALEATE 0.5 % OP SOLN
1.0000 [drp] | Freq: Two times a day (BID) | OPHTHALMIC | Status: DC
  Administered 2023-08-27 – 2023-08-30 (×7): 1 [drp] via OPHTHALMIC
  Filled 2023-08-27: qty 5

## 2023-08-27 MED ORDER — ACETAMINOPHEN 500 MG PO TABS
500.0000 mg | ORAL_TABLET | Freq: Four times a day (QID) | ORAL | Status: AC
  Administered 2023-08-27 – 2023-08-28 (×4): 500 mg via ORAL
  Filled 2023-08-27 (×4): qty 1

## 2023-08-27 MED ORDER — HYDROCODONE-ACETAMINOPHEN 7.5-325 MG PO TABS
2.0000 | ORAL_TABLET | ORAL | Status: DC | PRN
  Administered 2023-08-27 – 2023-08-28 (×4): 2 via ORAL
  Filled 2023-08-27 (×4): qty 2

## 2023-08-27 MED ORDER — DOCUSATE SODIUM 100 MG PO CAPS
100.0000 mg | ORAL_CAPSULE | Freq: Two times a day (BID) | ORAL | Status: DC
  Administered 2023-08-27 – 2023-08-28 (×3): 100 mg via ORAL
  Filled 2023-08-27 (×3): qty 1

## 2023-08-27 MED ORDER — VANCOMYCIN HCL 1000 MG IV SOLR
INTRAVENOUS | Status: AC
  Filled 2023-08-27: qty 20

## 2023-08-27 MED ORDER — VANCOMYCIN HCL 1000 MG IV SOLR
INTRAVENOUS | Status: DC | PRN
  Administered 2023-08-27: 1000 mg

## 2023-08-27 MED ORDER — GABAPENTIN 300 MG PO CAPS
600.0000 mg | ORAL_CAPSULE | Freq: Three times a day (TID) | ORAL | Status: DC
  Administered 2023-08-27 – 2023-08-30 (×10): 600 mg via ORAL
  Filled 2023-08-27 (×6): qty 2
  Filled 2023-08-27: qty 6
  Filled 2023-08-27 (×3): qty 2

## 2023-08-27 MED ORDER — INSULIN ASPART PROT & ASPART (70-30 MIX) 100 UNIT/ML ~~LOC~~ SUSP
50.0000 [IU] | Freq: Two times a day (BID) | SUBCUTANEOUS | Status: DC
  Filled 2023-08-27: qty 10

## 2023-08-27 MED ORDER — ONDANSETRON HCL 4 MG/2ML IJ SOLN
4.0000 mg | Freq: Four times a day (QID) | INTRAMUSCULAR | Status: DC | PRN
Start: 2023-08-27 — End: 2023-08-30

## 2023-08-27 MED ORDER — OXYCODONE HCL 5 MG PO TABS: 5.0000 mg | ORAL_TABLET | Freq: Once | ORAL | Status: DC | PRN

## 2023-08-27 MED ORDER — ENOXAPARIN SODIUM 30 MG/0.3ML IJ SOSY
30.0000 mg | PREFILLED_SYRINGE | INTRAMUSCULAR | Status: DC
  Administered 2023-08-28: 30 mg via SUBCUTANEOUS
  Filled 2023-08-27: qty 0.3

## 2023-08-27 MED ORDER — LACTATED RINGERS IV SOLN: INTRAVENOUS | Status: DC

## 2023-08-27 MED ORDER — INSULIN ASPART 100 UNIT/ML IJ SOLN
0.0000 [IU] | Freq: Three times a day (TID) | INTRAMUSCULAR | Status: DC
  Administered 2023-08-27: 5 [IU] via SUBCUTANEOUS
  Administered 2023-08-28 – 2023-08-30 (×6): 3 [IU] via SUBCUTANEOUS
  Administered 2023-08-30: 2 [IU] via SUBCUTANEOUS

## 2023-08-27 MED ORDER — DAPTOMYCIN-SODIUM CHLORIDE 700-0.9 MG/100ML-% IV SOLN
8.0000 mg/kg | Freq: Every day | INTRAVENOUS | Status: DC
  Administered 2023-08-27: 700 mg via INTRAVENOUS
  Filled 2023-08-27 (×2): qty 100

## 2023-08-27 MED ORDER — BUPIVACAINE HCL (PF) 0.25 % IJ SOLN
INTRAMUSCULAR | Status: AC
  Filled 2023-08-27: qty 30

## 2023-08-27 MED ORDER — PROPOFOL 10 MG/ML IV BOLUS
INTRAVENOUS | Status: AC
  Filled 2023-08-27: qty 20

## 2023-08-27 MED ORDER — LIDOCAINE 2% (20 MG/ML) 5 ML SYRINGE
INTRAMUSCULAR | Status: DC | PRN
  Administered 2023-08-27: 100 mg via INTRAVENOUS

## 2023-08-27 MED ORDER — DROPERIDOL 2.5 MG/ML IJ SOLN: 0.6250 mg | Freq: Once | INTRAMUSCULAR | Status: DC | PRN

## 2023-08-27 MED ORDER — CLOPIDOGREL BISULFATE 75 MG PO TABS
75.0000 mg | ORAL_TABLET | Freq: Every day | ORAL | Status: DC
  Administered 2023-08-28 – 2023-08-30 (×3): 75 mg via ORAL
  Filled 2023-08-27 (×3): qty 1

## 2023-08-27 MED ORDER — FUROSEMIDE 40 MG PO TABS
40.0000 mg | ORAL_TABLET | Freq: Every day | ORAL | Status: DC
  Administered 2023-08-27 – 2023-08-30 (×4): 40 mg via ORAL
  Filled 2023-08-27 (×4): qty 1

## 2023-08-27 MED ORDER — SUGAMMADEX SODIUM 200 MG/2ML IV SOLN
INTRAVENOUS | Status: DC | PRN
  Administered 2023-08-27: 600 mg via INTRAVENOUS

## 2023-08-27 SURGICAL SUPPLY — 51 items
BAG COUNTER SPONGE SURGICOUNT (BAG) ×1 IMPLANT
BANDAGE ESMARK 6X9 LF (GAUZE/BANDAGES/DRESSINGS) ×1 IMPLANT
BNDG ELASTIC 4INX 5YD STR LF (GAUZE/BANDAGES/DRESSINGS) IMPLANT
BNDG ELASTIC 4X5.8 VLCR STR LF (GAUZE/BANDAGES/DRESSINGS) ×2 IMPLANT
BNDG ELASTIC 6INX 5YD STR LF (GAUZE/BANDAGES/DRESSINGS) IMPLANT
CANISTER SUCT 3000ML PPV (MISCELLANEOUS) ×1 IMPLANT
CANISTER WOUND CARE 500ML ATS (WOUND CARE) IMPLANT
COVER SURGICAL LIGHT HANDLE (MISCELLANEOUS) ×1 IMPLANT
CUFF TRNQT CYL 34X4.125X (TOURNIQUET CUFF) IMPLANT
DRAPE C-ARM 42X72 X-RAY (DRAPES) IMPLANT
DRAPE C-ARMOR (DRAPES) IMPLANT
DRAPE DERMATAC (DRAPES) IMPLANT
DRAPE OEC MINIVIEW 54X84 (DRAPES) ×1 IMPLANT
DRAPE SURG ORHT 6 SPLT 77X108 (DRAPES) IMPLANT
DRAPE U-SHAPE 47X51 STRL (DRAPES) IMPLANT
DRESSING PEEL AND PLC PRVNA 13 (GAUZE/BANDAGES/DRESSINGS) IMPLANT
DURAPREP 26ML APPLICATOR (WOUND CARE) ×1 IMPLANT
ELECTRODE REM PT RTRN 9FT ADLT (ELECTROSURGICAL) ×1 IMPLANT
GAUZE PAD ABD 8X10 STRL (GAUZE/BANDAGES/DRESSINGS) ×2 IMPLANT
GAUZE SPONGE 4X4 12PLY STRL (GAUZE/BANDAGES/DRESSINGS) ×1 IMPLANT
GLOVE BIO SURGEON STRL SZ7.5 (GLOVE) ×2 IMPLANT
GLOVE BIOGEL PI IND STRL 7.5 (GLOVE) ×1 IMPLANT
GLOVE BIOGEL PI IND STRL 8 (GLOVE) ×1 IMPLANT
GLOVE SURG SYN 7.5 E (GLOVE) ×1 IMPLANT
GLOVE SURG SYN 7.5 PF PI (GLOVE) ×1 IMPLANT
GOWN STRL REUS W/ TWL LRG LVL3 (GOWN DISPOSABLE) ×2 IMPLANT
GOWN STRL REUS W/ TWL XL LVL3 (GOWN DISPOSABLE) ×2 IMPLANT
GRAFT SKIN WND MICRO 38 (Tissue) IMPLANT
KIT BASIN OR (CUSTOM PROCEDURE TRAY) ×1 IMPLANT
KIT TURNOVER KIT B (KITS) ×1 IMPLANT
MANIFOLD NEPTUNE II (INSTRUMENTS) ×1 IMPLANT
NDL 22X1.5 STRL (OR ONLY) (MISCELLANEOUS) ×1 IMPLANT
NEEDLE 22X1.5 STRL (OR ONLY) (MISCELLANEOUS) IMPLANT
NS IRRIG 1000ML POUR BTL (IV SOLUTION) ×1 IMPLANT
PACK ORTHO EXTREMITY (CUSTOM PROCEDURE TRAY) ×1 IMPLANT
PAD ARMBOARD POSITIONER FOAM (MISCELLANEOUS) ×2 IMPLANT
PAD CAST 4YDX4 CTTN HI CHSV (CAST SUPPLIES) ×2 IMPLANT
SPONGE T-LAP 18X18 ~~LOC~~+RFID (SPONGE) ×1 IMPLANT
STRIP CLOSURE SKIN 1/2X4 (GAUZE/BANDAGES/DRESSINGS) ×1 IMPLANT
SUCTION TUBE FRAZIER 10FR DISP (SUCTIONS) ×1 IMPLANT
SUT MNCRL AB 4-0 PS2 18 (SUTURE) ×1 IMPLANT
SUT MON AB 2-0 CT1 27 (SUTURE) IMPLANT
SUT VIC AB 0 CT1 27XBRD ANBCTR (SUTURE) IMPLANT
SYR BULB IRRIG 60ML STRL (SYRINGE) ×1 IMPLANT
SYR CONTROL 10ML LL (SYRINGE) IMPLANT
TOWEL GREEN STERILE (TOWEL DISPOSABLE) ×1 IMPLANT
TOWEL GREEN STERILE FF (TOWEL DISPOSABLE) ×1 IMPLANT
TUBE CONNECTING 12X1/4 (SUCTIONS) ×1 IMPLANT
UNDERPAD 30X36 HEAVY ABSORB (UNDERPADS AND DIAPERS) ×2 IMPLANT
WATER STERILE IRR 1000ML POUR (IV SOLUTION) ×1 IMPLANT
YANKAUER SUCT BULB TIP NO VENT (SUCTIONS) IMPLANT

## 2023-08-27 NOTE — Progress Notes (Signed)
 PHARMACY ANTIBIOTIC CONSULT NOTE   Andrew Larsen a 83 y.o. 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  Pharmacy has been consulted for daptomycin dosing. Of note pt on crestor  10 mg Qsun.   Estimated Creatinine Clearance: 63.8 mL/min (by C-G formula based on SCr of 1.21 mg/dL).  Plan: START Daptomycin 700 mg IV Q24H (CrCl>30, Adj BW ~8 mg/KG for BMI>30)  Cont CRO 2g IV Q24H per MD  F/U ID recs, CK weekly (first 4/30 AM), C/S   Allergies:  Allergies  Allergen Reactions   Metformin And Related Shortness Of Breath   Pollen Extract Itching and Other (See Comments)    Some wheezing, itchy eyes, runny nose   Simbrinza [Brinzolamide-Brimonidine ] Other (See Comments)    Drainage, redness to eyes    [facility] Weights   08/26/23 1335 08/27/23 0805  Weight: 118.8 kg (261 lb 14.5 oz) 118.8 kg (262 lb)       Latest Ref Rng & Units 08/27/2023    2:04 PM 07/23/2023    4:47 AM 07/22/2023    4:13 AM  CBC  WBC 4.0 - 10.5 K/uL 5.3  8.4  8.9   Hemoglobin 13.0 - 17.0 g/dL 09.8  11.9  14.7   Hematocrit 39.0 - 52.0 % 40.3  38.9  39.6   Platelets 150 - 400 K/uL 184  173  166     Antibiotics Given (last 72 hours)     Date/Time Action Medication Dose   08/27/23 1115 Given  [exp 09/2024]   vancomycin  (VANCOCIN ) powder 1,000 mg       Chrystie Crass, PharmD Clinical Pharmacist  08/27/2023 4:42 PM

## 2023-08-27 NOTE — Anesthesia Procedure Notes (Signed)
 Procedure Name: Intubation Date/Time: 08/27/2023 10:43 AM  Performed by: Lethaniel Rave, MDPre-anesthesia Checklist: Patient identified, Emergency Drugs available, Suction available and Patient being monitored Patient Re-evaluated:Patient Re-evaluated prior to induction Oxygen  Delivery Method: Circle System Utilized Preoxygenation: Pre-oxygenation with 100% oxygen  Induction Type: IV induction and Cricoid Pressure applied Ventilation: Mask ventilation without difficulty Laryngoscope Size: Miller and 2 Grade View: Grade II Tube type: Oral Tube size: 7.5 mm Number of attempts: 1 Airway Equipment and Method: Stylet, Oral airway and Bite block Placement Confirmation: ETT inserted through vocal cords under direct vision, positive ETCO2 and breath sounds checked- equal and bilateral Secured at: 24 cm Tube secured with: Tape Dental Injury: Teeth and Oropharynx as per pre-operative assessment

## 2023-08-27 NOTE — Anesthesia Postprocedure Evaluation (Signed)
 Anesthesia Post Note  Patient: [name]  Procedure(s) Performed: INCISION AND DRAINAGE OF DEEP ABSCESS, ANKLE (Left) CREATION, FLAP, ROTATION (Left) REMOVAL, HARDWARE (Left) APPLICATION, WOUND VAC (Left) APPLICATION, ALLOGRAFT, SKIN (Left)     Patient location during evaluation: PACU Anesthesia Type: General Level of consciousness: awake and alert Pain management: pain level controlled Vital Signs Assessment: post-procedure vital signs reviewed and stable Respiratory status: spontaneous breathing, nonlabored ventilation, respiratory function stable and patient connected to nasal cannula oxygen  Cardiovascular status: blood pressure returned to baseline and stable Postop Assessment: no apparent nausea or vomiting Anesthetic complications: no   No notable events documented.  Last Vitals:  Vitals:   08/27/23 1245 08/27/23 1300  BP: (!) 143/67 129/66  Pulse: 70 83  Resp: 19 14  Temp: 36.4 C (!) 36.4 C  SpO2: 100% 100%    Last Pain:  Vitals:   08/27/23 1758  TempSrc:   PainSc: 8                  Andrew Larsen

## 2023-08-27 NOTE — Transfer of Care (Signed)
 Immediate Anesthesia Transfer of Care Note  Patient: HANZEL MAGRINI  Procedure(s) Performed: INCISION AND DRAINAGE OF DEEP ABSCESS, ANKLE (Left) CREATION, FLAP, ROTATION (Left) REMOVAL, HARDWARE (Left) APPLICATION, WOUND VAC (Left) APPLICATION, ALLOGRAFT, SKIN (Left)  Patient Location: PACU  Anesthesia Type:General  Level of Consciousness: drowsy  Airway & Oxygen  Therapy: Patient Spontanous Breathing and Patient connected to face mask oxygen   Post-op Assessment: Report given to RN and Post -op Vital signs reviewed and stable  Post vital signs: Reviewed and stable  Last Vitals:  Vitals Value Taken Time  BP 132/83 08/27/23 1149  Temp 36.8 C 08/27/23 1148  Pulse 81 08/27/23 1151  Resp 14 08/27/23 1151  SpO2 98 % 08/27/23 1151  Vitals shown include unfiled device data.  Last Pain:  Vitals:   08/27/23 0824  TempSrc:   PainSc: 2          Complications: No notable events documented.

## 2023-08-27 NOTE — Consult Note (Signed)
 Initial Consultation Note   Patient: Andrew Larsen JXB:[id number] DOB: Oct 30, 1940 PCP: Andrew Liberty, MD DOA: 08/27/2023 DOS: the patient was seen and examined on 08/27/2023 Primary service: Andrew Curl, MD  Referring physician: [name], MD Reason for consult: T2DM, HTN management  Assessment/Plan: Assessment and Plan: No notes have been filed under this hospital service. Service: Hospitalist  Left ankle abscess status post I&D (08/27/2023, Dr. Abigail Larsen) Recent history of left ankle fracture s/p repair (07/21/2023, Dr. Abigail Larsen) Patient has already been taken to the OR by orthopedics today for I&D of abscess, removal of hardware, allograft placement, wound VAC with Dr. Abigail Larsen.  Per Dr. Abigail Larsen, infectious disease has been consulted for antibiotic management given presence of infection.  Pain control has been ordered by orthopedics.  Patient is nonweightbearing to left lower extremity and left lower extremity with wound vac applied.  Patient has remained afebrile since arrival.  CBC is pending.  Patient is currently on 2 L [place] which appears to be for comfort as opposed to true respiratory compromise.  Surgical cultures have been drawn and are currently in process.  Have ordered blood cultures. - Orthopedics managing as primary - ID has been consulted - Antibiotics per ID - Pain control per primary team - Antiemetics per primary team - Follow-up surgical cultures and blood cultures - Admitted to inpatient with primary team - lovenox  and SCDs for dvt ppx per primary team - f/u CBC and trend fever curve  T2DM A1c last month at 7.3%.  Patient is on Novolin 70/30 at 50 units twice daily along with Mounjaro 2.5 mg weekly.  CBGs today are 86 > 87 > 95. Patient reports that he is not currently hungry but will try to eat dinner.  Will start reduced dose of home Novolin at 25 units twice daily and add moderate sliding scale insulin . -Resume Novolin 70/30 at 25 units twice daily - Moderate  sliding scale insulin  - Trend CBGs, goal 140-180 - holding home mounjaro - carb modified diet  HTN Patient on metoprolol  tartrate 25mg  BID and losartan  25mg  daily at home.  Blood pressures ranging in the 120s-140s/70s.  Will resume home antihypertensives. - Resume home metoprolol  tartrate and losartan  - Trend BP curve  PAD s/p angioplasty of left SFA 2018 HLD - Primary team has resumed patient's home Plavix  75 mg daily - Holding home aspirin  per primary team - Resume home Crestor  10 mg every Sunday and zetia  10mg  daily  Chronic LE edema No history of CHF per chart review. ECHO on 07/22/2023 with LVEF 65-70%, normal LV function, no regional wall motion abnormalities, mild concentric LVH, indeterminate diastolic parameters.  Patient is on Lasix  40 mg daily at home. He does have some pitting edema present, not more than usual per patient. -resume home lasix  40mg  daily -strict I/O's, daily weights  CKD 3A Baseline creatinine around 1.2-1.4.  Awaiting CMP on admission to assess kidney function. 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  HPI: Andrew Larsen is a 83 y.o. male with past medical history of HTN, T2DM, CKD 3A, ILD, inclusion body myositis, obesity, OSA, PAD s/p angioplasty of left SFA 2018, prostate cancer  s/p radiation therapy, hx of left ankle fracture s/p repair (07/21/2023) presenting with left ankle prosthesis infection.  Patient reports that he was admitted last month for a left ankle fracture sustained from a mechanical fall.  He did undergo surgical repair at the time.  States that he was initially doing fine but  began noticing that his left lower extremity wound was not healing well.  States that a few weeks ago, he did notice drainage from the wound but this has since improved and he is not currently having any drainage.  States that drainage was yellow/clear in appearance.  He did not notice any foul-smelling odor.  Given the symptoms, he was worried that he could possibly have an infection around the hardware.  He was seen by his family medicine doctor yesterday and was advised to come to the ED for further evaluation.  He does note that his mobility has been less than prior to the surgery.  States that he has been having some difficulty moving around at home and has been trying to get around with 1 foot.  States that he attempts to get out of bed and make his way to the bathroom whenever he feels he is able to.  Patient seen and admitted by orthopedics today.  He was taken to the OR for incision and drainage of deep abscess noted in the left ankle.  He also underwent flap creation, removal of hardware, allograft application, and wound VAC application.  [facility] hospitalist was asked to consult on patient by orthopedics for management of patient's comorbid conditions including his type 2 diabetes and hypertension.  ID has been consulted by orthopedics for initiation/management of antibiotics.  CBC and CMP have been ordered but have not yet been drawn.  Review of Systems: As mentioned in the history of present illness. All other systems reviewed and are negative. Past Medical History:  Diagnosis Date   Arthritis    CHF (congestive heart failure) (HCC)    per pt's daughter   CKD (chronic kidney disease)    CKD stage III (01/2018)   Daytime somnolence    Diabetes mellitus without complication (HCC)    Fracture    right fibula/wears boot cast   GERD (gastroesophageal reflux disease)    Glaucoma    Hypercholesteremia    Hyperlipidemia    Hypertension    Inclusion body myositis    diagnosed 2020   Mold  exposure    7 yrs ago   PAD (peripheral artery disease) (HCC)    Prostate cancer (HCC)    Sleep apnea    uses cpap   Uses walker    Wears glasses    Past Surgical History:  Procedure Laterality Date   GOLD SEED IMPLANT N/A 01/08/2023   Procedure: GOLD SEED IMPLANT;  Surgeon: Mallie Seal, MD;  [facility];  Service: Urology;  Laterality: N/A;  30 MINUTES   LOWER EXTREMITY ANGIOGRAPHY N/A 06/12/2016   Procedure: Lower Extremity Angiography;  Surgeon: Knox Perl, MD;  Location: [facility] INVASIVE CV LAB;  Service: Cardiovascular;  Laterality: N/A;   MUSCLE BIOPSY Right 05/06/2018   Procedure: right vastus lateralis muscle biopsy;  Surgeon: Agustina Aldrich, MD;  Location: [facility] OR;  Service: Neurosurgery;  Laterality: Right;   ORIF ANKLE FRACTURE Right 10/02/2012   Procedure: OPEN REDUCTION INTERNAL FIXATION (ORIF) ANKLE FRACTURE;  Surgeon: Jasmine Mesi, MD;  Location: [facility] ORS;  Service: Orthopedics;  Laterality: Right;   ORIF ANKLE FRACTURE Left 07/21/2023   Procedure:  OPEN REDUCTION INTERNAL FIXATION (ORIF) ANKLE FRACTURE;  Surgeon: Andrew Curl, MD;  Location: [facility] ORS;  Service: Orthopedics;  Laterality: Left;   PERIPHERAL VASCULAR ATHERECTOMY Left 06/12/2016   Procedure: Peripheral Vascular Atherectomy-Left Popliteal;  Surgeon: Knox Perl, MD;  Location: [facility] INVASIVE CV LAB;  Service: Cardiovascular;  Laterality: Left;   PERIPHERAL VASCULAR INTERVENTION Left 06/12/2016   Procedure: Peripheral Vascular Intervention- DCB Left Popliteal;  Surgeon: Knox Perl, MD;  Location: [facility] INVASIVE CV LAB;  Service: Cardiovascular;  Laterality: Left;   PROSTATE BIOPSY     SPACE OAR INSTILLATION N/A 01/08/2023   Procedure: SPACE OAR INSTILLATION;  Surgeon: Mallie Seal, MD;  Location: [facility];  Service: Urology;  Laterality: N/A;   Social History:  reports that he quit smoking about 42 years ago. His smoking use included cigarettes. He started smoking about 62  years ago. He has a 20 pack-year smoking history. He has never used smokeless tobacco. He reports that he does not drink alcohol  and does not use drugs.  Allergies  Allergen Reactions   Metformin And Related Shortness Of Breath   Pollen Extract Itching and Other (See Comments)    Some wheezing, itchy eyes, runny nose   Simbrinza [Brinzolamide-Brimonidine ] Other (See Comments)    Drainage, redness to eyes    Family History  Problem Relation Age of Onset   Heart disease Father    Prostate cancer Brother    Cancer Brother    Diabetes Brother    Colon cancer Neg Hx    Sleep apnea Neg Hx     Prior to Admission medications   Medication Sig Start Date End Date Taking? Authorizing Provider  clopidogrel  (PLAVIX ) 75 MG tablet Take 1 tablet (75 mg total) by mouth daily. 07/05/23  Yes Knox Perl, MD  dorzolamide  (TRUSOPT ) 2 % ophthalmic solution Place 1 drop into the right eye 2 (two) times daily.   Yes [provider]  ezetimibe  (ZETIA ) 10 MG tablet TAKE 1 TABLET(10 MG) BY MOUTH DAILY AFTER SUPPER 03/19/23  Yes Knox Perl, MD  furosemide  (LASIX ) 40 MG tablet Take 1 tablet (40 mg total) by mouth daily. 12/15/18  Yes Angiulli, Everlyn Hockey, PA-C  gabapentin  (NEURONTIN ) 300 MG capsule Take 2 capsules (600 mg total) by mouth 3 (three) times daily. 12/15/18  Yes Angiulli, Everlyn Hockey, PA-C  latanoprost  (XALATAN ) 0.005 % ophthalmic solution Place 1 drop into both eyes at bedtime.   Yes [provider]  losartan  (COZAAR ) 25 MG tablet TAKE 1 TABLET(25 MG) BY MOUTH DAILY 10/05/19  Yes Knox Perl, MD  melatonin 5 MG TABS Take 5 mg by mouth at bedtime.   Yes [provider]  metoprolol  tartrate (LOPRESSOR ) 25 MG tablet Take 1 tablet (25 mg total) by mouth 2 (two) times daily. 12/15/18  Yes Angiulli, Everlyn Hockey, PA-C  NOVOLIN 70/30 (70-30) 100 UNIT/ML injection Inject 50 Units into the skin in the morning and at bedtime. 07/03/23  Yes [provider]  Oxycodone  HCl 10 MG TABS Take 1  tablet (10 mg total) by mouth every 6 (six) hours. Patient taking differently: Take 10 mg by mouth every 6 (six) hours as needed (Moderate pain). 08/19/23  Yes Beamer, Lisa Rideau, MD  polyethylene glycol (MIRALAX  / GLYCOLAX ) 17 g packet Take 17 g by mouth daily as needed. 07/24/23  Yes Faith Homes, MD  PRESCRIPTION MEDICATION CPAP- At bedtime   Yes [provider]  rosuvastatin  (CRESTOR ) 10 MG tablet Take 10 mg by mouth every Sunday. 05/26/23  Yes [provider]  senna (SENOKOT) 8.6 MG TABS tablet Take 2 tablets by mouth at bedtime.   Yes [provider]  tamsulosin  (FLOMAX ) 0.4 MG CAPS capsule Take 0.4 mg by mouth daily.   Yes [provider]  timolol  (TIMOPTIC ) 0.5 % ophthalmic solution Place 1 drop into the right eye 2 (two) times daily. 01/30/19  Yes [provider]  aspirin  EC 81 MG tablet Take 1 tablet (81 mg total) by mouth 2 (two) times daily. Swallow whole. 07/24/23   Faith Homes, MD  cephALEXin (KEFLEX) 500 MG capsule Take 500 mg by mouth 4 (four) times daily. 08/21/23   [provider]  Continuous Blood Gluc Sensor (FREESTYLE LIBRE 2 SENSOR) MISC Inject 1 Device into the skin every 14 (fourteen) days. 08/22/21   [provider]  docusate sodium  (COLACE) 100 MG capsule Take 1 capsule (100 mg total) by mouth 2 (two) times daily as needed for mild constipation. 07/24/23   Faith Homes, MD  Multiple Vitamin (MULTIVITAMIN WITH MINERALS) TABS tablet Take 1 tablet by mouth daily with breakfast.    [provider]  tirzepatide Florence Hunt) 2.5 MG/0.5ML Pen 2.5mg  Subcutaneous once aweek for 28 days 03/13/23   [provider]    Physical Exam: Vitals:   08/27/23 1215 08/27/23 1230 08/27/23 1245 08/27/23 1300  BP: 129/78 130/71 (!) 143/67 129/66  Pulse: 82 80 70 83  Resp: 11 13 19 14   Temp:   97.6 F (36.4 C) (!) 97.5 F (36.4 C)  TempSrc:      SpO2: 100% 100% 100% 100%  Weight:      Height:       Physical  Exam Constitutional:      Appearance: He is obese. He is not ill-appearing.  HENT:     Head: Normocephalic and atraumatic.     Mouth/Throat:     Mouth: Mucous membranes are moist.     Pharynx: Oropharynx is clear. No oropharyngeal exudate.  Eyes:     General: No scleral icterus.    Extraocular Movements: Extraocular movements intact.     Conjunctiva/sclera: Conjunctivae normal.     Pupils: Pupils are equal, round, and reactive to light.  Cardiovascular:     Rate and Rhythm: Normal rate and regular rhythm.     Heart sounds: Normal heart sounds. No murmur heard.    No friction rub. No gallop.  Pulmonary:     Effort: Pulmonary effort is normal. No respiratory distress.     Breath sounds: Normal breath sounds. No wheezing, rhonchi or rales.     Comments: Saturating well on 2L [place], remained saturating well when weaned off oxygen  while I was present in room. Again placed on 2L for comfort. Abdominal:     General: Bowel sounds are normal. There is no distension.     Palpations: Abdomen is soft.     Tenderness: There is no abdominal tenderness. There is no guarding or rebound.  Musculoskeletal:     Cervical back: Normal range of motion.     Comments: LLE with wound vac attached. Pain with any manipulation. 1+ pitting edema noted on RLE up to mid-shin.  Skin:    General: Skin is warm and dry.  Neurological:     General: No focal deficit present.     Mental Status: He is alert and oriented to person, place, and time.  Psychiatric:        Mood and Affect: Mood normal.        Behavior: Behavior  normal.     Data Reviewed:   Results are pending, will review when available.    Family Communication: no family at bedside Primary team communication: Discussed with primary team. Thank you very much for involving us  in the care of your patient.  Author: Mandy Second, MD 08/27/2023 1:03 PM  For on call review [email or web address].

## 2023-08-27 NOTE — Interval H&P Note (Signed)
 History and Physical Interval Note:  08/27/2023 8:13 AM  Andrew Larsen  has presented today for surgery, with the diagnosis of ABSCESS LEFT ANKLE, STATUS POST ORIF.  The various methods of treatment have been discussed with the patient and family. After consideration of risks, benefits and other options for treatment, the patient has consented to  Procedure(s): INCISION AND DRAINAGE OF DEEP ABSCESS, ANKLE (Left) CREATION, FLAP, ROTATION (Left) REMOVAL, HARDWARE (Left) APPLICATION, WOUND VAC (Left) APPLICATION, ALLOGRAFT, SKIN (Left) as a surgical intervention.  The patient's history has been reviewed, patient examined, no change in status, stable for surgery.  I have reviewed the patient's chart and labs.  Questions were answered to the patient's satisfaction.     Saundra Curl

## 2023-08-27 NOTE — Discharge Instructions (Signed)

## 2023-08-27 NOTE — Plan of Care (Signed)
  Problem: Education: Goal: Knowledge of General Education information will improve Description: Including pain rating scale, medication(s)/side effects and non-pharmacologic comfort measures Outcome: Progressing   Problem: Nutrition: Goal: Adequate nutrition will be maintained Outcome: Progressing   Problem: Elimination: Goal: Will not experience complications related to urinary retention Outcome: Progressing   Problem: Pain Managment: Goal: General experience of comfort will improve and/or be controlled Outcome: Progressing

## 2023-08-27 NOTE — Plan of Care (Signed)

## 2023-08-27 NOTE — Progress Notes (Signed)
 Location:  Other Nursing Home Room Number: [place] [facility]A Place of Service:  SNF (31) Provider:  Harvie Liner, MD  Patient Care Team: Barnetta Liberty, MD as PCP - General (Internal Medicine) Barnetta Liberty, MD (Internal Medicine) Katheleen Palmer, RN as Oncology Nurse Navigator  Extended Emergency Contact Information Primary Emergency Contact: [facility] Address: [address]          [place], [place] [address] [place] Home Phone: [phone] Mobile Phone: [phone] Relation: Daughter  Code Status:  DNR Goals of care: Advanced Directive information    08/26/2023    1:33 PM  Advanced Directives  Does Patient Have a Medical Advance Directive? Yes  Type of Estate agent of [place];Living will  Does patient want to make changes to medical advance directive? No - Patient declined     Chief Complaint  Patient presents with   Medical Management of Chronic Issues    HPI:  Pt is a 83 y.o. male seen today for an routine visit for  Discussed the use of AI scribe software for clinical note transcription with the patient, who gave verbal consent to proceed.  History of Present Illness   History of Present Illness The patient presents with a non-healing wound and neuropathy. Patient initially admitted after ankle fracture on 07/20/2023.  He has a non-healing wound that initially required surgical intervention. The wound was previously draining significantly but has since improved, with no current drainage observed. There is concern about a possible infection attached to the hardware, though this has not been confirmed. He is worried about the potential need for another surgery to remove the hardware.  He suffers from neuropathy, which developed concurrently with the wound. He has been taking gabapentin  for neuropathy, but it is not very effective, even after increasing the dosage to the maximum level he can tolerate. The neuropathy  persists despite this treatment.  He experiences constipation, which he attributes to pain medication. His bowel movements improve with the use of medications like Miralax  and Senna.  In terms of mobility, he has been working on getting around on one foot at home, including getting out of bed and to the bathroom. He attempts these activities when he feels capable.  He is retired from [employer] and previously studied as a Financial planner. He is married, and his spouse was a Engineer, civil (consulting). He has a daughter who is involved in his care. No shortness of breath or chest pain.    Past Medical History:  Diagnosis Date   Arthritis    CHF (congestive heart failure) (HCC)    per pt's daughter   CKD (chronic kidney disease)    CKD stage III (01/2018)   Daytime somnolence    Diabetes mellitus without complication (HCC)    Fracture    right fibula/wears boot cast   GERD (gastroesophageal reflux disease)    Glaucoma    Hypercholesteremia    Hyperlipidemia    Hypertension    Inclusion body myositis    diagnosed 2020   Mold exposure    7 yrs ago   PAD (peripheral artery disease) (HCC)    Prostate cancer (HCC)    Sleep apnea    uses cpap   Uses walker    Wears glasses    Past Surgical History:  Procedure Laterality Date   GOLD SEED IMPLANT N/A 01/08/2023   Procedure: GOLD SEED IMPLANT;  Surgeon: Mallie Seal, MD;  Location: [facility];  Service: Urology;  Laterality: N/A;  30 MINUTES   LOWER EXTREMITY ANGIOGRAPHY N/A 06/12/2016   Procedure: Lower Extremity Angiography;  Surgeon: Knox Perl, MD;  Location: [facility] INVASIVE CV LAB;  Service: Cardiovascular;  Laterality: N/A;   MUSCLE BIOPSY Right 05/06/2018   Procedure: right vastus lateralis muscle biopsy;  Surgeon: Agustina Aldrich, MD;  Location: [facility] OR;  Service: Neurosurgery;  Laterality: Right;   ORIF ANKLE FRACTURE Right 10/02/2012   Procedure: OPEN REDUCTION INTERNAL FIXATION (ORIF) ANKLE FRACTURE;  Surgeon: Jasmine Mesi, MD;   Location: [facility] ORS;  Service: Orthopedics;  Laterality: Right;   ORIF ANKLE FRACTURE Left 07/21/2023   Procedure: OPEN REDUCTION INTERNAL FIXATION (ORIF) ANKLE FRACTURE;  Surgeon: Saundra Curl, MD;  Location: [facility] ORS;  Service: Orthopedics;  Laterality: Left;   PERIPHERAL VASCULAR ATHERECTOMY Left 06/12/2016   Procedure: Peripheral Vascular Atherectomy-Left Popliteal;  Surgeon: Knox Perl, MD;  Location: [facility] INVASIVE CV LAB;  Service: Cardiovascular;  Laterality: Left;   PERIPHERAL VASCULAR INTERVENTION Left 06/12/2016   Procedure: Peripheral Vascular Intervention- DCB Left Popliteal;  Surgeon: Knox Perl, MD;  Location: [facility] INVASIVE CV LAB;  Service: Cardiovascular;  Laterality: Left;   PROSTATE BIOPSY     SPACE OAR INSTILLATION N/A 01/08/2023   Procedure: SPACE OAR INSTILLATION;  Surgeon: Mallie Seal, MD;  Location: [facility];  Service: Urology;  Laterality: N/A;    Allergies  Allergen Reactions   Metformin And Related Shortness Of Breath   Pollen Extract Itching and Other (See Comments)    Some wheezing, itchy eyes, runny nose   Simbrinza [Brinzolamide-Brimonidine ] Other (See Comments)    Drainage, redness to eyes    Outpatient Encounter Medications as of 08/26/2023  Medication Sig   aspirin  EC 81 MG tablet Take 1 tablet (81 mg total) by mouth 2 (two) times daily. Swallow whole.   clopidogrel  (PLAVIX ) 75 MG tablet Take 1 tablet (75 mg total) by mouth daily.   Continuous Blood Gluc Sensor (FREESTYLE LIBRE 2 SENSOR) MISC Inject 1 Device into the skin every 14 (fourteen) days.   docusate sodium  (COLACE) 100 MG capsule Take 1 capsule (100 mg total) by mouth 2 (two) times daily as needed for mild constipation.   ezetimibe  (ZETIA ) 10 MG tablet TAKE 1 TABLET(10 MG) BY MOUTH DAILY AFTER SUPPER   furosemide  (LASIX ) 40 MG tablet Take 1 tablet (40 mg total) by mouth daily.   gabapentin  (NEURONTIN ) 300 MG capsule Take 2 capsules (600 mg total) by mouth 3 (three) times daily.    latanoprost  (XALATAN ) 0.005 % ophthalmic solution Place 1 drop into both eyes at bedtime.   losartan  (COZAAR ) 25 MG tablet TAKE 1 TABLET(25 MG) BY MOUTH DAILY   metoprolol  tartrate (LOPRESSOR ) 25 MG tablet Take 1 tablet (25 mg total) by mouth 2 (two) times daily.   Multiple Vitamin (MULTIVITAMIN WITH MINERALS) TABS tablet Take 1 tablet by mouth daily with breakfast.   NOVOLIN 70/30 (70-30) 100 UNIT/ML injection Inject 50 Units into the skin in the morning and at bedtime.   Oxycodone  HCl 10 MG TABS Take 1 tablet (10 mg total) by mouth every 6 (six) hours. (Patient taking differently: Take 10 mg by mouth every 6 (six) hours as needed (Moderate pain).)   polyethylene glycol (MIRALAX  / GLYCOLAX ) 17 g packet Take 17 g by mouth daily as needed.   PRESCRIPTION MEDICATION CPAP- At bedtime   rosuvastatin  (CRESTOR ) 10 MG tablet Take 10 mg by mouth every Sunday.   tamsulosin  (FLOMAX ) 0.4 MG CAPS capsule Take 0.4 mg by mouth daily.  timolol  (TIMOPTIC ) 0.5 % ophthalmic solution Place 1 drop into the right eye 2 (two) times daily.   tirzepatide (MOUNJARO) 2.5 MG/0.5ML Pen 2.5mg  Subcutaneous once aweek for 28 days   [DISCONTINUED] dorzolamide  (TRUSOPT ) 2 % ophthalmic solution Place 1 drop into the right eye 2 (two) times daily.   No facility-administered encounter medications on file as of 08/26/2023.    Review of Systems  Immunization History  Administered Date(s) Administered   Influenza Split 04/30/2012, 03/19/2013, 03/10/2014, 02/07/2015   Influenza, High Dose Seasonal PF 01/29/2017, 02/16/2023   Influenza, Quadrivalent, Recombinant, Inj, Pf 02/03/2018, 03/05/2019, 02/24/2020, 02/28/2021   Influenza,inj,Quad PF,6+ Mos 03/10/2014, 01/11/2015   Influenza,trivalent, recombinat, inj, PF 03/19/2013   Influenza-Unspecified 02/03/2018   Novavax(Covid-19) Vaccine 04/18/2023   PFIZER Comirnaty(Gray Top)Covid-19 Tri-Sucrose Vaccine 03/18/2020   PFIZER(Purple Top)SARS-COV-2 Vaccination 05/28/2019, 06/19/2019,  11/19/2020   PNEUMOCOCCAL CONJUGATE-20 03/19/2023   Pfizer Covid-19 Vaccine Bivalent Booster 54yrs & up 03/13/2021   Pfizer(Comirnaty)Fall Seasonal Vaccine 12 years and older 01/16/2022   Pneumococcal Conjugate-13 12/10/2013   Pneumococcal Polysaccharide-23 05/01/2011   Tdap 04/30/2008, 08/10/2022   Zoster Recombinant(Shingrix) 01/16/2022, 03/19/2022   Zoster, Live 12/10/2013   Pertinent  Health Maintenance Due  Topic Date Due   FOOT EXAM  Never done   OPHTHALMOLOGY EXAM  Never done   INFLUENZA VACCINE  11/29/2023   HEMOGLOBIN A1C  01/21/2024      09/04/2021   10:00 PM 09/05/2021    7:56 AM 09/06/2021    8:37 AM 09/06/2021    8:00 PM 09/07/2021    9:03 AM  Fall Risk  (RETIRED) Patient Fall Risk Level High fall risk High fall risk High fall risk High fall risk High fall risk   Functional Status Survey:    There were no vitals filed for this visit. There is no height or weight on file to calculate BMI. Physical Exam Constitutional:      Appearance: Normal appearance.  Cardiovascular:     Rate and Rhythm: Normal rate and regular rhythm.     Pulses: Normal pulses.     Heart sounds: Normal heart sounds.  Pulmonary:     Effort: Pulmonary effort is normal.  Abdominal:     General: Abdomen is flat. Bowel sounds are normal.     Palpations: Abdomen is soft.  Musculoskeletal:        General: No swelling or tenderness.     Comments: LLE in boot  Skin:    General: Skin is warm and dry.  Neurological:     Mental Status: He is alert and oriented to person, place, and time.     Gait: Gait normal.  Psychiatric:        Mood and Affect: Mood normal.     Labs reviewed: Recent Labs    07/21/23 0418 07/22/23 0413 07/23/23 0447  NA 137 132* 134*  K 3.9 4.3 4.6  CL 102 99 101  CO2 27 25 27   GLUCOSE 160* 184* 135*  BUN 20 24* 28*  CREATININE 1.35* 0.98 1.24  CALCIUM  8.3* 8.4* 8.3*   No results for input(s): "AST", "ALT", "ALKPHOS", "BILITOT", "PROT", "ALBUMIN" in the last 8760  hours. Recent Labs    07/21/23 0418 07/22/23 0413 07/23/23 0447  WBC 7.2 8.9 8.4  HGB 13.0 13.3 12.7*  HCT 39.8 39.6 38.9*  MCV 97.3 94.7 96.5  PLT 152 166 173   Lab Results  Component Value Date   TSH 3.910 07/13/2019   Lab Results  Component Value Date   HGBA1C 7.3 (H)  07/21/2023   Lab Results  Component Value Date   CHOL 157 02/15/2020   HDL 49 02/15/2020   LDLCALC 95 02/15/2020   TRIG 67 02/15/2020   CHOLHDL 2.7 11/08/2018    Significant Diagnostic Results in last 30 days:  No results found.  Assessment/Plan Non-healing wound The wound is dry and not draining, but not healing as expected. There is a potential infection related to hardware, possibly necessitating removal. He is concerned about surgery with general anesthesia and prefers alternatives like a spinal or nerve block. The surgical team is considering hardware removal and infectious disease consultation for culture if needed. - Discuss with surgical team the use of spinal or nerve block instead of general anesthesia for hardware removal. - Consult infectious disease team if hardware infection is suspected.  Neuropathy Chronic neuropathy managed with gabapentin  at the maximum tolerated dose, providing limited relief. Persistent symptoms despite medication adjustments.  Constipation Intermittent constipation likely due to pain medication use, improving with Miralax  and Senna. - Continue Miralax  and Senna as needed for constipation relief.  Family/ staff Communication: nursing  Labs/tests ordered:  none

## 2023-08-27 NOTE — Consult Note (Signed)
 [facility]    Date of Admission:  08/27/2023     Total days of antibiotics 1               Reason for Consult: Ankle infection   Referring Provider: Dr. Broadus Canes Primary Care Provider: Barnetta Liberty, MD   ASSESSMENT:  Mr. Hernandezgarci is an 83 year old African-American male with recent fall resulting in a trimalleolar fracture status post ORIF with course complicated by nonhealing wound and found to have abscess of the left ankle now status post debridement and hardware removal.  Surgical specimens pending.  Change antibiotics to daptomycin and ceftriaxone  with narrowing of antibiotics as able per culture results.  Therapeutic drug monitoring of CK levels while on daptomycin.  Discussed importance of diabetes control to reduce risk of complicated healing and further infection.  Continue postoperative wound care per orthopedics.  Remaining medical and supportive care per primary team.   PLAN:  Change antibiotics to daptomycin and ceftriaxone . Postoperative wound care per orthopedics. Monitor surgical cultures for organisms with narrowing of antibiotics as able. Control of diabetes to reduce risk of complicated healing and further infection. Therapeutic monitoring of CK levels while on daptomycin. Universal/standard precautions. Remaining medical and supportive care per internal medicine.   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)    acetaminophen   500 mg Oral Q6H   [START ON 08/28/2023] clopidogrel   75 mg Oral Daily   docusate sodium   100 mg Oral BID   dorzolamide   1 drop Right Eye BID   [START ON 08/28/2023] enoxaparin  (LOVENOX ) injection  30 mg Subcutaneous Q24H   ezetimibe   10 mg Oral QPC supper   fentaNYL        furosemide   40 mg Oral Daily   gabapentin   600 mg Oral TID   HYDROmorphone        insulin  aspart  0-15 Units Subcutaneous TID WC   insulin  aspart protamine- aspart  25 Units Subcutaneous BID  WC   latanoprost   1 drop Both Eyes QHS   losartan   25 mg Oral Daily   metoprolol  tartrate  25 mg Oral BID   [START ON 08/28/2023] multivitamin with minerals  1 tablet Oral Q breakfast   [START ON 09/01/2023] rosuvastatin   10 mg Oral Q Sun   senna  1 tablet Oral BID   [START ON 08/28/2023] tamsulosin   0.4 mg Oral Daily   timolol   1 drop Right Eye BID     HPI: JURON PETITTE is a 83 y.o. male with previous medical history of type 2 diabetes, hyperlipidemia, hypertension, peripheral artery disease status post angioplasty, prostate cancer, sleep apnea, and chronic kidney disease presenting with an abscess of his left ankle status post ORIF.  Mr. Nie was recently hospitalized on 07/20/2023 following a fall while using his walker and underwent x-ray and CT of the left ankle which showed trimalleolar fracture.  Underwent ORIF with orthopedic surgery on 07/21/2023.  Was seen in follow-up on 08/26/2023 at [facility] with nonhealing wound that initially required surgical intervention.  There was previous drainage which had been improved.  Completed 2 courses of cephalexin dispensed on 08/07/2023 and 08/21/2023.  Mr. Mabe is now returning to the hospital with an abscess of the left ankle status post ORIF.  Afebrile on admission.  Brought to the OR for debridement of deep abscess of the left ankle and removal of hardware.  Surgical specimens obtained and pending.  Currently on cefazolin .  Mr. Aber is out of surgery earlier today and has postoperative pain and requesting additional pain medicine.  Wound VAC in place with no drainage.   Review of Systems: Review of Systems  Constitutional:  Negative for chills, fever and weight loss.  Respiratory:  Negative for cough, shortness of breath and wheezing.   Cardiovascular:  Negative for chest pain and leg swelling.  Gastrointestinal:  Negative for abdominal pain, constipation, diarrhea, nausea and vomiting.  Musculoskeletal:        Positive for  left ankle pain  Skin:  Negative for rash.     Past Medical History:  Diagnosis Date   Arthritis    CHF (congestive heart failure) (HCC)    per pt's daughter   CKD (chronic kidney disease)    CKD stage III (01/2018)   Daytime somnolence    Diabetes mellitus without complication (HCC)    Fracture    right fibula/wears boot cast   GERD (gastroesophageal reflux disease)    Glaucoma    Hypercholesteremia    Hyperlipidemia    Hypertension    Inclusion body myositis    diagnosed 2020   Mold exposure    7 yrs ago   PAD (peripheral artery disease) (HCC)    Prostate cancer (HCC)    Sleep apnea    uses cpap   Uses walker    Wears glasses     Social History   Tobacco Use   Smoking status: Former    Current packs/day: 0.00    Average packs/day: 1 pack/day for 20.0 years (20.0 ttl pk-yrs)    Types: Cigarettes    Start date: 08/22/1961    Quit date: 08/22/1981    Years since quitting: 42.0   Smokeless tobacco: Never  Vaping Use   Vaping status: Never Used  Substance Use Topics   Alcohol  use: No    Alcohol /week: 0.0 standard drinks of alcohol    Drug use: No    Family History  Problem Relation Age of Onset   Heart disease Father    Prostate cancer Brother    Cancer Brother    Diabetes Brother    Colon cancer Neg Hx    Sleep apnea Neg Hx     Allergies  Allergen Reactions   Metformin And Related Shortness Of Breath   Pollen Extract Itching and Other (See Comments)    Some wheezing, itchy eyes, runny nose   Simbrinza [Brinzolamide-Brimonidine ] Other (See Comments)    Drainage, redness to eyes    OBJECTIVE: Blood pressure 129/66, pulse 83, temperature (!) 97.5 F (36.4 C), resp. rate 14, height 5\' 11"  (1.803 m), weight 118.8 kg, SpO2 100%.  Physical Exam Constitutional:      General: He is not in acute distress.    Appearance: He is well-developed.     Comments: Lying in bed with head of bed elevated; pleasant.  Cardiovascular:     Rate and Rhythm: Normal  rate and regular rhythm.     Heart sounds: Normal heart sounds.  Pulmonary:     Effort: Pulmonary effort is normal.     Breath sounds: Normal breath sounds.  Musculoskeletal:     Comments: Postsurgical dressing with wound VAC in place.  No drainage in canister.  Skin:    General: Skin is warm and dry.  Neurological:     Mental Status: He is alert and oriented to person, place, and time.     Lab Results Lab Results  Component Value Date   WBC 8.4 07/23/2023  HGB 12.7 (L) 07/23/2023   HCT 38.9 (L) 07/23/2023   MCV 96.5 07/23/2023   PLT 173 07/23/2023    Lab Results  Component Value Date   CREATININE 1.24 07/23/2023   BUN 28 (H) 07/23/2023   NA 134 (L) 07/23/2023   K 4.6 07/23/2023   CL 101 07/23/2023   CO2 27 07/23/2023    Lab Results  Component Value Date   ALT 28 09/01/2021   AST 36 09/01/2021   ALKPHOS 109 09/01/2021   BILITOT 1.2 09/01/2021     Microbiology: No results found for this or any previous visit (from the past 240 hours).   Greg Trevion Hoben, NP [facility]  08/27/2023  2:10 PM

## 2023-08-28 ENCOUNTER — Ambulatory Visit: Admitting: Internal Medicine

## 2023-08-28 ENCOUNTER — Encounter (HOSPITAL_COMMUNITY): Payer: Self-pay | Admitting: Orthopedic Surgery

## 2023-08-28 DIAGNOSIS — R7881 Bacteremia: Secondary | ICD-10-CM | POA: Insufficient documentation

## 2023-08-28 DIAGNOSIS — T8454XA Infection and inflammatory reaction due to internal left knee prosthesis, initial encounter: Secondary | ICD-10-CM | POA: Diagnosis not present

## 2023-08-28 DIAGNOSIS — Z96662 Presence of left artificial ankle joint: Secondary | ICD-10-CM | POA: Diagnosis not present

## 2023-08-28 DIAGNOSIS — B9689 Other specified bacterial agents as the cause of diseases classified elsewhere: Secondary | ICD-10-CM | POA: Diagnosis not present

## 2023-08-28 DIAGNOSIS — T8459XA Infection and inflammatory reaction due to other internal joint prosthesis, initial encounter: Secondary | ICD-10-CM | POA: Diagnosis not present

## 2023-08-28 LAB — BLOOD CULTURE ID PANEL (REFLEXED) - BCID2

## 2023-08-28 LAB — CBC
HCT: 38.1 % — ABNORMAL LOW (ref 39.0–52.0)
Hemoglobin: 13 g/dL (ref 13.0–17.0)
MCH: 31.2 pg (ref 26.0–34.0)
MCHC: 34.1 g/dL (ref 30.0–36.0)
MCV: 91.4 fL (ref 80.0–100.0)
Platelets: 217 10*3/uL (ref 150–400)
RBC: 4.17 MIL/uL — ABNORMAL LOW (ref 4.22–5.81)
RDW: 13.5 % (ref 11.5–15.5)
WBC: 9.2 10*3/uL (ref 4.0–10.5)
nRBC: 0 % (ref 0.0–0.2)

## 2023-08-28 LAB — C-REACTIVE PROTEIN: CRP: 4.8 mg/dL — ABNORMAL HIGH (ref <1.0)

## 2023-08-28 LAB — GLUCOSE, CAPILLARY
Glucose-Capillary: 151 mg/dL — ABNORMAL HIGH (ref 70–99)
Glucose-Capillary: 82 mg/dL (ref 70–99)
Glucose-Capillary: 168 mg/dL — ABNORMAL HIGH (ref 70–99)
Glucose-Capillary: 175 mg/dL — ABNORMAL HIGH (ref 70–99)

## 2023-08-28 LAB — SEDIMENTATION RATE: Sed Rate: 42 mm/h — ABNORMAL HIGH (ref 0–16)

## 2023-08-28 LAB — COMPREHENSIVE METABOLIC PANEL WITH GFR
ALT: 29 U/L (ref 0–44)
AST: 28 U/L (ref 15–41)
Albumin: 3.1 g/dL — ABNORMAL LOW (ref 3.5–5.0)
Alkaline Phosphatase: 101 U/L (ref 38–126)
Anion gap: 10 (ref 5–15)
BUN: 23 mg/dL (ref 8–23)
CO2: 24 mmol/L (ref 22–32)
Calcium: 9 mg/dL (ref 8.9–10.3)
Chloride: 99 mmol/L (ref 98–111)
Creatinine, Ser: 1.24 mg/dL (ref 0.61–1.24)
GFR, Estimated: 59 mL/min — ABNORMAL LOW (ref >60)
Glucose, Bld: 151 mg/dL — ABNORMAL HIGH (ref 70–99)
Potassium: 4.5 mmol/L (ref 3.5–5.1)
Sodium: 133 mmol/L — ABNORMAL LOW (ref 135–145)
Total Bilirubin: 0.7 mg/dL (ref 0.0–1.2)
Total Protein: 6.9 g/dL (ref 6.5–8.1)

## 2023-08-28 LAB — CK: Total CK: 288 U/L (ref 49–397)

## 2023-08-28 MED ORDER — SENNOSIDES-DOCUSATE SODIUM 8.6-50 MG PO TABS: 2.0000 | ORAL_TABLET | Freq: Two times a day (BID) | ORAL | Status: DC

## 2023-08-28 MED ORDER — SODIUM CHLORIDE 0.9 % IV SOLN
2.0000 g | Freq: Three times a day (TID) | INTRAVENOUS | Status: DC
  Administered 2023-08-28 – 2023-08-29 (×4): 2 g via INTRAVENOUS
  Filled 2023-08-28 (×4): qty 12.5

## 2023-08-28 MED ORDER — ACETAMINOPHEN 500 MG PO TABS
1000.0000 mg | ORAL_TABLET | Freq: Four times a day (QID) | ORAL | Status: DC | PRN
  Administered 2023-08-29 – 2023-08-30 (×2): 1000 mg via ORAL
  Filled 2023-08-28 (×2): qty 2

## 2023-08-28 MED ORDER — OXYCODONE HCL 5 MG PO TABS
5.0000 mg | ORAL_TABLET | ORAL | Status: DC | PRN
  Administered 2023-08-28 (×2): 5 mg via ORAL
  Filled 2023-08-28 (×2): qty 1

## 2023-08-28 MED ORDER — SENNOSIDES-DOCUSATE SODIUM 8.6-50 MG PO TABS
2.0000 | ORAL_TABLET | Freq: Two times a day (BID) | ORAL | Status: AC
  Administered 2023-08-28 (×2): 2 via ORAL
  Filled 2023-08-28 (×2): qty 2

## 2023-08-28 MED ORDER — ENOXAPARIN SODIUM 60 MG/0.6ML IJ SOSY
60.0000 mg | PREFILLED_SYRINGE | INTRAMUSCULAR | Status: DC
  Administered 2023-08-29 – 2023-08-30 (×2): 60 mg via SUBCUTANEOUS
  Filled 2023-08-28 (×2): qty 0.6

## 2023-08-28 MED ORDER — SENNOSIDES-DOCUSATE SODIUM 8.6-50 MG PO TABS
1.0000 | ORAL_TABLET | Freq: Two times a day (BID) | ORAL | Status: DC
  Administered 2023-08-29 – 2023-08-30 (×2): 1 via ORAL
  Filled 2023-08-28 (×2): qty 1

## 2023-08-28 MED ORDER — POLYETHYLENE GLYCOL 3350 17 G PO PACK: 17.0000 g | PACK | Freq: Two times a day (BID) | ORAL | Status: DC | PRN

## 2023-08-28 NOTE — Progress Notes (Signed)
 Physical Therapy Note  PT eval complete with full note to follow;  Recommend returning to SNF for rehab for transition out of hospital;  Will follow,    Darcus Eastern, PT  Acute Rehabilitation Services Pager [phone] Office [phone]

## 2023-08-28 NOTE — Evaluation (Signed)
 Physical Therapy Evaluation Patient Details Name: Andrew Larsen MRN: [id number] DOB: Jun 30, 1940 Today's Date: 08/28/2023  History of Present Illness  Andrew Larsen is a 83 y.o. male who presents with a diagnosis of ABSCESS LEFT ANKLE, STATUS POST ORIF; surgical I&D, hardware exchange, WBAT in boot;  has a past medical history of Arthritis, CHF (congestive heart failure) (HCC), CKD (chronic kidney disease), Daytime somnolence, Diabetes mellitus without complication (HCC), Fracture, GERD (gastroesophageal reflux disease), Glaucoma, Hypercholesteremia, Hyperlipidemia, Hypertension, Inclusion body myositis, Mold exposure, PAD (peripheral artery disease) (HCC), Prostate cancer (HCC), Sleep apnea, Uses walker, and Wears glasses.  Clinical Impression   Pt admitted with above diagnosis. Lives at home with his dtr, in a 2-level home with 4 steps to enter; Prior to the injury that eventually lead to this admission, pt was able to walk household and community distances with RW and rollator; was undergoing rehab when this problem came up; Presents to PT with decr activity tolerance, decr strength, unsteadiness on feet; Has been managing NWB for a while, and now is allowed to [facility] on RLE in boot; needed light mod assist to stand, and min assist to take a few steps; accepted some weight onto RLE, though unsteady;  recommend return to SNF to complete rehab course and maximize independence and safety with mobility and ADLs; Pt currently with functional limitations due to the deficits listed below (see PT Problem List). Pt will benefit from skilled PT to increase their independence and safety with mobility to allow discharge to the venue listed below.           If plan is discharge home, recommend the following: A lot of help with walking and/or transfers;A lot of help with bathing/dressing/bathroom;Assistance with cooking/housework   Can travel by private vehicle    (perhaps soon)    Equipment Recommendations  None recommended by PT (pretty well-equipped)  Recommendations for Other Services       Functional Status Assessment Patient has had a recent decline in their functional status and demonstrates the ability to make significant improvements in function in a reasonable and predictable amount of time.     Precautions / Restrictions Precautions Precautions: Fall Recall of Precautions/Restrictions: Intact Required Braces or Orthoses: Other Brace Other Brace: Boot Restrictions LLE Weight Bearing Per Provider Order: Weight bearing as tolerated Other Position/Activity Restrictions: WBAT in boot      Mobility  Bed Mobility               General bed mobility comments: EOB upon arrival    Transfers Overall transfer level: Needs assistance Equipment used: Rolling walker (2 wheels) Transfers: Sit to/from Stand, Bed to chair/wheelchair/BSC Sit to Stand: Mod assist   Step pivot transfers: Min assist, +2 safety/equipment       General transfer comment: Reports feeling off balance with differing height between boot and shoe; Dependence on UE suport    Ambulation/Gait                  Stairs            Wheelchair Mobility     Tilt Bed    Modified Rankin (Stroke Patients Only)       Balance                                             Pertinent Vitals/Pain Pain Assessment Pain Assessment: No/denies pain  Home Living Family/patient expects to be discharged to:: Skilled nursing facility Living Arrangements: Other (Comment) [facility]) Available Help at Discharge: Family Type of Home: House Home Access: Stairs to enter Entrance Stairs-Rails: Doctor, general practice of Steps: 4   Home Layout: Two level;Able to live on main level with bedroom/bathroom Home Equipment: Rolling Walker (2 wheels);Rollator (4 wheels);Wheelchair - manual;Tub bench      Prior Function Prior Level of Function : Independent/Modified  Independent;History of Falls (last six months)             Mobility Comments: pt reports using RW or rollator for mobility in the home and limited community distances ADLs Comments: pt reports ind wtih self care, daughter completes household chores     Extremity/Trunk Assessment   Upper Extremity Assessment Upper Extremity Assessment: Overall WFL for tasks assessed    Lower Extremity Assessment Lower Extremity Assessment: Generalized weakness;RLE deficits/detail RLE Deficits / Details: RLower leg in boot; motor intact, able move toes; Boot is heavy, but can extend knee against gravity       Communication   Communication Communication: No apparent difficulties    Cognition Arousal: Alert Behavior During Therapy: WFL for tasks assessed/performed   PT - Cognitive impairments: No apparent impairments                       PT - Cognition Comments: at times tangential Following commands: Intact       Cueing Cueing Techniques: Verbal cues     General Comments General comments (skin integrity, edema, etc.): NAD on room air; very concerned about VAC tubing    Exercises     Assessment/Plan    PT Assessment Patient needs continued PT services  PT Problem List Decreased strength;Decreased range of motion;Decreased activity tolerance;Decreased balance;Decreased mobility;Decreased coordination;Decreased knowledge of use of DME;Decreased safety awareness;Decreased knowledge of precautions       PT Treatment Interventions DME instruction;Gait training;Stair training;Functional mobility training;Therapeutic activities;Therapeutic exercise;Balance training;Neuromuscular re-education;Cognitive remediation;Patient/family education    PT Goals (Current goals can be found in the Care Plan section)  Acute Rehab PT Goals Patient Stated Goal: Wants to get back to rehab so that he will not need as much help once home PT Goal Formulation: With patient Time For Goal  Achievement: 09/11/23 Potential to Achieve Goals: Good    Frequency Min 2X/week     Co-evaluation               AM-PAC PT "6 Clicks" Mobility  Outcome Measure Help needed turning from your back to your side while in a flat bed without using bedrails?: None Help needed moving from lying on your back to sitting on the side of a flat bed without using bedrails?: None Help needed moving to and from a bed to a chair (including a wheelchair)?: A Little Help needed standing up from a chair using your arms (e.g., wheelchair or bedside chair)?: A Little Help needed to walk in hospital room?: A Lot Help needed climbing 3-5 steps with a railing? : Total 6 Click Score: 17    End of Session Equipment Utilized During Treatment: Gait belt;Other (comment) (boot) Activity Tolerance: Patient tolerated treatment well Patient left: in chair;with call bell/phone within reach;with chair alarm set Nurse Communication: Mobility status PT Visit Diagnosis: Unsteadiness on feet (R26.81);Other abnormalities of gait and mobility (R26.89);Muscle weakness (generalized) (M62.81)    Time: 5621-3086 PT Time Calculation (min) (ACUTE ONLY): 22 min   Charges:   PT Evaluation $PT Eval  Moderate Complexity: 1 Mod   PT General Charges $$ ACUTE PT VISIT: 1 Visit         Darcus Eastern, PT  Acute Rehabilitation Services Office [phone] Secure Chat welcomed   Marcial Setting 08/28/2023, 7:41 PM

## 2023-08-28 NOTE — Progress Notes (Signed)
 Subjective: Patient reports pain as moderate. Medicines help. Tolerating diet.  Urinating.   No CP, SOB.  Has not mobilized OOB with PT yet.  Objective:   VITALS:   Vitals:   08/27/23 1951 08/28/23 0456 08/28/23 0730 08/28/23 1429  BP: 133/78 (!) 152/69 110/72 (!) 107/56  Pulse: (!) 101 69 68 75  Resp: 16 16 19 18   Temp: 97.8 F (36.6 C) (!) 97.3 F (36.3 C) 98.4 F (36.9 C) 98.1 F (36.7 C)  TempSrc: Oral  Oral   SpO2: 91% 100% 99% 100%  Weight:      Height:          Latest Ref Rng & Units 08/28/2023    6:14 AM 08/27/2023    2:04 PM 07/23/2023    4:47 AM  CBC  WBC 4.0 - 10.5 K/uL 9.2  5.3  8.4   Hemoglobin 13.0 - 17.0 g/dL 13.0  86.5  78.4   Hematocrit 39.0 - 52.0 % 38.1  40.3  38.9   Platelets 150 - 400 K/uL 217  184  173       Latest Ref Rng & Units 08/28/2023    6:14 AM 08/27/2023    2:04 PM 07/23/2023    4:47 AM  BMP  Glucose 70 - 99 mg/dL 696   295   BUN 8 - 23 mg/dL 23   28   Creatinine 2.84 - 1.24 mg/dL 1.32  4.40  1.02   Sodium 135 - 145 mmol/L 133   134   Potassium 3.5 - 5.1 mmol/L 4.5   4.6   Chloride 98 - 111 mmol/L 99   101   CO2 22 - 32 mmol/L 24   27   Calcium  8.9 - 10.3 mg/dL 9.0   8.3    Intake/Output      04/30 0701 05/01 0700   P.O.    I.V. (mL/kg)    IV Piggyback    Total Intake(mL/kg)    Urine (mL/kg/hr)    Drains 5   Blood    Total Output 5   Net -5          Assessment: 1 Day Post-Op  S/P Procedure(s) (LRB): INCISION AND DRAINAGE OF DEEP ABSCESS, ANKLE (Left) CREATION, FLAP, ROTATION (Left) REMOVAL, HARDWARE (Left) APPLICATION, WOUND VAC (Left) APPLICATION, ALLOGRAFT, SKIN (Left) by [name]. 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) Active Problems:   DMII (diabetes mellitus, type 2) (HCC)   Essential hypertension   Inclusion body myositis   Hypoalbuminemia due to protein-calorie malnutrition (HCC)   Stage 3a chronic kidney disease (CKD) (HCC)    Malignant neoplasm of prostate (HCC)   OSA (obstructive sleep apnea)   BPH (benign prostatic hyperplasia)   ILD (interstitial lung disease) (HCC)   Bacteremia due to Enterobacter species    Plan: Wound culture growing Enterobacter   Blood culture growing same ABX per ID  Advance diet Up with therapy Incentive Spirometry Elevate and Apply ice  Weightbearing: TDWB LLE Insicional and dressing care: Dressings left intact until follow-up and Reinforce dressings as needed Orthopedic device(s): Wound VOZ:DGUY lateral ankle and CAM boot Showering: Keep dressing dry VTE prophylaxis:  Lovenox  30mg  daily , SCDs, ambulation Pain control: PRN Follow - up plan: 1 week Contact information:  [name] MD, [name] PA-C  Dispo: Skilled Nursing Facility/Rehab once cleared by ID and medically ready. Will need PICC line in a few days once blood infection  clears     [name], PA-C Office [phone] 08/28/2023, 7:07 PM

## 2023-08-28 NOTE — Op Note (Signed)
 08/27/2023  7:59 AM  PATIENT:  [name]    PRE-OPERATIVE DIAGNOSIS:  ABSCESS LEFT ANKLE, STATUS POST OPEN REDUCTION INTERNAL FIXATION  POST-OPERATIVE DIAGNOSIS:  Same  PROCEDURE:  INCISION AND DRAINAGE OF DEEP ABSCESS, ANKLE, CREATION, FLAP, ROTATION, REMOVAL, HARDWARE, APPLICATION, WOUND VAC, APPLICATION, ALLOGRAFT, SKIN  SURGEON:  Saundra Curl, MD  ASSISTANT: [name], PA-C, he was present and scrubbed throughout the case, critical for completion in a timely fashion, and for retraction, instrumentation, and closure.   ANESTHESIA:   General  PREOPERATIVE INDICATIONS:  Andrew Larsen is a  83 y.o. male with a diagnosis of ABSCESS LEFT ANKLE, STATUS POST OPEN REDUCTION INTERNAL FIXATION who failed conservative measures and elected for surgical management.    The risks benefits and alternatives were discussed with the patient preoperatively including but not limited to the risks of infection, bleeding, nerve injury, cardiopulmonary complications, the need for revision surgery, among others, and the patient was willing to proceed.  OPERATIVE IMPLANTS: Kerecis grafting  OPERATIVE FINDINGS: Wound dehiscence  BLOOD LOSS: Minimal  COMPLICATIONS: None  TOURNIQUET TIME: 45  OPERATIVE PROCEDURE:  Patient was identified in the preoperative holding area and site was marked by me He was transported to the operating theater and placed on the table in supine position taking care to pad all bony prominences. After a preincinduction time out anesthesia was induced. The left lower extremity was prepped and draped in normal sterile fashion and a pre-incision timeout was performed. He received Ancef  after culture for preoperative antibiotics.   I opened up his lateral incision he had some wound dehiscence with some necrotic tissue and prior hematoma around his lateral fibula.  I evacuated the abscess and hematoma from the lateral ankle  I first performed a hardware removal I removed  the plate and screws.  I left the lag screw in the medial mall screw to provide some stability for the remainder of healing I also remove the tight rope from the lateral aspect with all stitches came out complete   Next I debrided any devitalized tissue using a rondure.  I next mix Kerecis 38 with vancomycin  powder and applied this to the lateral fibula wound the tight rope all around the anterior and posterior aspect of the fibula completing a grafting of skin substitute to the leg.  Next I performed a complex soft tissue rearrangement and closure of the ankle wound using multiple nylon stitches.  I placed an incisional negative pressure dressing.  He was then placed in a boot   POST OPERATIVE PLAN: Touchdown weightbearing elevate ID consult

## 2023-08-28 NOTE — Progress Notes (Signed)
 PROGRESS NOTE  Andrew Larsen ZOX:[id number] DOB: 08-30-1940   PCP: Barnetta Liberty, MD  Patient is from: Home.  Lives with family.  DOA: 08/27/2023 LOS: 1  Chief complaints Infection of left ankle prosthesis    Brief Narrative / Interim history: -year-old M with PMH of left ankle fracture s/p repair on 3/23, HTN, T2DM, CKD 3A, ILD, inclusion body myositis, obesity, OSA on CPAP, PAD s/p angioplasty of left SFA 2018, prostate cancer s/p radiation, presenting with left ankle prosthesis infection.  Patient went to see his PCP due to concern for delayed surgical wound healing and some yellow/clear drainage.  He was sent to ED due to concern for infection.  In ED, vital stable without fever.  No leukocytosis.  He was taken to the OR for I&D, hardware removal and VAC placement.  Cultures obtained.  Started on broad-spectrum antibiotics.  ID consulted.  Hospitalist service consulted for help with management of chronic comorbidities.  The next day, blood culture with Enterobacter cloacae in 1 out of 2 bottles.    Subjective: Seen and examined earlier this morning.  No major events overnight of this morning.  Reports pain in his left foot from surgery and neuropathy.  Has not had a bowel movement in 3 to 4 days.  Denies chest pain, shortness of breath, GI or UTI symptoms.  Did not use CPAP last night because "they did not have one".  Patient's daughter at bedside.  Objective: Vitals:   08/27/23 1300 08/27/23 1951 08/28/23 0456 08/28/23 0730  BP: 129/66 133/78 (!) 152/69 110/72  Pulse: 83 (!) 101 69 68  Resp: 14 16 16 19   Temp: (!) 97.5 F (36.4 C) 97.8 F (36.6 C) (!) 97.3 F (36.3 C) 98.4 F (36.9 C)  TempSrc:  Oral  Oral  SpO2: 100% 91% 100% 99%  Weight:      Height:        Examination:  GENERAL: No apparent distress.  Nontoxic. HEENT: MMM.  Vision and hearing grossly intact.  NECK: Supple.  No apparent JVD.  RESP:  No IWOB.  Fair aeration bilaterally. CVS:  RRR. Heart sounds  normal.  ABD/GI/GU: BS+. Abd soft, NTND.  MSK/EXT:  Moves extremities.  Postop boot and wound VAC in place. SKIN: no apparent skin lesion or wound NEURO: Awake, alert and oriented appropriately.  No apparent focal neuro deficit. PSYCH: Calm. Normal affect.   Consultants:  Orthopedic surgery Infectious disease  Procedures: 4/29-I&D of left ankle abscess with removal of hardware and application of wound VAC  Microbiology summarized: 4/29-blood culture with Enterobacter cloacae in 1 out of 2 bottles 4/29-surgical tissue culture with moderate GNR.  Assessment and plan: Left ankle abscess with hardware infection: had recent ankle surgery on 3/23 for ankle fracture after mechanical fall.  Presented with slow healing surgical wound and drainage.  No fever or leukocytosis.  CRP 4.8.  ESR 42. -S/p I&D with removal of hardware and application of wound VAC by Dr. [name] on 4/29 -Surgical culture with moderate GNR.  Blood culture with Enterobacter cloacae in 1 out of 2 bottles. -On daptomycin and cefepime per ID -Follow-up culture sensitivity -Pain control and VTE prophylaxis per surgery-I have taken the liberty to schedule Tylenol  -Wound care and therapy per primary -Bowel regimen  Enterobacter cloacae bacteremia: Blood culture as above -Antibiotics as above  IDDM-2 with hyperglycemia: A1c 7.3 on 3/23.  On Novolin 70/30 at 50 units BID and Mounjaro at home Recent Labs  Lab 08/27/23 1201 08/27/23 1719 08/27/23 2119 08/28/23  1610 08/28/23 1106  GLUCAP 95 234* 289* 151* 82  - Continue Novolin 70/30 at 25 units twice daily - Continue moderate sliding scale - Further adjustment as appropriate  Essential hypertension: Normotensive -Continue home metoprolol  and losartan    PAD s/p angioplasty of left SFA 2018 -Continue home Plavix , Crestor , Zetia  and aspirin    Chronic LE edema: TTE on 3/24 without significant finding. -Continue home p.o. Lasix  -Monitor fluid and respiratory status    CKD 3A: Baseline Cr ~1.2-1.4.  Stable -Monitor   OSA - Ensure he wears CPAP at night   Inclusion body myositis: Noted on workup at [facility], has positive NT5C1A antibody -PT/OT eval   BPH - Continue home Flomax    Hx of stage 1 prostate cancer s/p radiation therapy -follows urology for PSA monitoring posttreatment  Constipation: LBM 3 to 4 days ago. -Adjusted bowel regimen     TRH will continue to follow the patient.  Class II obesity Body mass index is 36.54 kg/m. - Already on [facility] outpatient. -Encourage lifestyle change to lose weight   DVT prophylaxis:  enoxaparin  (LOVENOX ) injection 30 mg Start: 08/28/23 0800 SCDs Start: 08/27/23 1258  Code Status: Full code Family Communication: Updated patient's daughter at bedside Level of care: Med-Surg Status is: Inpatient   Final disposition: Per primary   55 minutes with more than 50% spent in reviewing records, counseling patient/family and coordinating care.   Sch Meds:  Scheduled Meds:  clopidogrel   75 mg Oral Daily   dorzolamide   1 drop Right Eye BID   enoxaparin  (LOVENOX ) injection  30 mg Subcutaneous Q24H   ezetimibe   10 mg Oral QPC supper   furosemide   40 mg Oral Daily   gabapentin   600 mg Oral TID   insulin  aspart  0-15 Units Subcutaneous TID WC   insulin  aspart protamine- aspart  25 Units Subcutaneous BID WC   latanoprost   1 drop Both Eyes QHS   losartan   25 mg Oral Daily   metoprolol  tartrate  25 mg Oral BID   multivitamin with minerals  1 tablet Oral Q breakfast   [START ON 09/01/2023] rosuvastatin   10 mg Oral Q Sun   senna-docusate  2 tablet Oral BID   Followed by   Cecily Cohen ON 08/29/2023] senna-docusate  1 tablet Oral BID   tamsulosin   0.4 mg Oral Daily   timolol   1 drop Right Eye BID   Continuous Infusions:  ceFEPime (MAXIPIME) IV 2 g (08/28/23 0847)   PRN Meds:.acetaminophen , bisacodyl , diphenhydrAMINE , HYDROcodone -acetaminophen , HYDROcodone -acetaminophen , magnesium  citrate, metoCLOPramide   **OR** metoCLOPramide  (REGLAN ) injection, morphine  injection, ondansetron  **OR** ondansetron  (ZOFRAN ) IV, polyethylene glycol  Antimicrobials: Anti-infectives (From admission, onward)    Start     Dose/Rate Route Frequency Ordered Stop   08/28/23 1000  ceFEPIme (MAXIPIME) 2 g in sodium chloride  0.9 % 100 mL IVPB        2 g 200 mL/hr over 30 Minutes Intravenous Every 8 hours 08/28/23 0826     08/27/23 1730  DAPTOmycin (CUBICIN) IVPB 700 mg/100mL premix  Status:  Discontinued        8 mg/kg  92.7 kg (Adjusted) 200 mL/hr over 30 Minutes Intravenous Daily 08/27/23 1642 08/28/23 1019   08/27/23 1715  cefTRIAXone  (ROCEPHIN ) 2 g in sodium chloride  0.9 % 100 mL IVPB  Status:  Discontinued        2 g 200 mL/hr over 30 Minutes Intravenous Every 24 hours 08/27/23 1622 08/28/23 0826   08/27/23 1115  vancomycin  (VANCOCIN ) powder  Status:  Discontinued  As needed 08/27/23 1121 08/27/23 1147        I have personally reviewed the following labs and images: CBC: Recent Labs  Lab 08/27/23 1404 08/28/23 0614  WBC 5.3 9.2  HGB 13.5 13.0  HCT 40.3 38.1*  MCV 92.6 91.4  PLT 184 217   BMP &GFR Recent Labs  Lab 08/27/23 1404 08/28/23 0614  NA  --  133*  K  --  4.5  CL  --  99  CO2  --  24  GLUCOSE  --  151*  BUN  --  23  CREATININE 1.21 1.24  CALCIUM   --  9.0   Estimated Creatinine Clearance: 62.3 mL/min (by C-G formula based on SCr of 1.24 mg/dL). Liver & Pancreas: Recent Labs  Lab 08/28/23 0614  AST 28  ALT 29  ALKPHOS 101  BILITOT 0.7  PROT 6.9  ALBUMIN 3.1*   No results for input(s): "LIPASE", "AMYLASE" in the last 168 hours. No results for input(s): "AMMONIA" in the last 168 hours. Diabetic: No results for input(s): "HGBA1C" in the last 72 hours. Recent Labs  Lab 08/27/23 0949 08/27/23 1201 08/27/23 1719 08/27/23 2119 08/28/23 0629  GLUCAP 87 95 234* 289* 151*   Cardiac Enzymes: Recent Labs  Lab 08/28/23 0614  CKTOTAL 288   No results for input(s):  "PROBNP" in the last 8760 hours. Coagulation Profile: No results for input(s): "INR", "PROTIME" in the last 168 hours. Thyroid  Function Tests: No results for input(s): "TSH", "T4TOTAL", "FREET4", "T3FREE", "THYROIDAB" in the last 72 hours. Lipid Profile: No results for input(s): "CHOL", "HDL", "LDLCALC", "TRIG", "CHOLHDL", "LDLDIRECT" in the last 72 hours. Anemia Panel: No results for input(s): "VITAMINB12", "FOLATE", "FERRITIN", "TIBC", "IRON", "RETICCTPCT" in the last 72 hours. Urine analysis:    Component Value Date/Time   COLORURINE YELLOW 09/01/2021 1758   APPEARANCEUR CLEAR 09/01/2021 1758   LABSPEC 1.011 09/01/2021 1758   PHURINE 6.0 09/01/2021 1758   GLUCOSEU NEGATIVE 09/01/2021 1758   HGBUR SMALL (A) 09/01/2021 1758   BILIRUBINUR NEGATIVE 09/01/2021 1758   KETONESUR NEGATIVE 09/01/2021 1758   PROTEINUR NEGATIVE 09/01/2021 1758   NITRITE NEGATIVE 09/01/2021 1758   LEUKOCYTESUR SMALL (A) 09/01/2021 1758   Sepsis Labs: Invalid input(s): "PROCALCITONIN", "LACTICIDVEN"  Microbiology: Recent Results (from the past 240 hours)  Aerobic/Anaerobic Culture w Gram Stain (surgical/deep wound)     Status: None (Preliminary result)   Collection Time: 08/27/23 11:04 AM   Specimen: Wound  Result Value Ref Range Status   Specimen Description WOUND  Final   Special Requests NONE  Final   Gram Stain   Final    NO WBC SEEN NO ORGANISMS SEEN Performed at [facility], [address] [place], [place] [address]    Culture MODERATE GRAM NEGATIVE RODS  Final   Report Status PENDING  Incomplete  Culture, blood (Routine X 2) w Reflex to ID Panel     Status: None (Preliminary result)   Collection Time: 08/27/23  2:04 PM   Specimen: BLOOD  Result Value Ref Range Status   Specimen Description BLOOD SITE NOT SPECIFIED  Final   Special Requests   Final    BOTTLES DRAWN AEROBIC AND ANAEROBIC Blood Culture adequate volume   Culture   Final    NO GROWTH < 24 HOURS Performed at [facility], [address] [place], [place] [address]    Report Status PENDING  Incomplete  Culture, blood (Routine X 2) w Reflex to ID Panel  Status: None (Preliminary result)   Collection Time: 08/27/23  2:11 PM   Specimen: BLOOD  Result Value Ref Range Status   Specimen Description BLOOD SITE NOT SPECIFIED  Final   Special Requests   Final    BOTTLES DRAWN AEROBIC AND ANAEROBIC Blood Culture adequate volume   Culture  Setup Time   Final    GRAM NEGATIVE RODS AEROBIC BOTTLE ONLY Organism ID to follow CRITICAL RESULT CALLED TO, READ BACK BY AND VERIFIED WITH: J LEDFORD,[email or web address]  08/28/23 MK Performed at [facility], [address], [place], [place] [address]    Culture GRAM NEGATIVE RODS  Final   Report Status PENDING  Incomplete  Blood Culture ID Panel (Reflexed)     Status: Abnormal   Collection Time: 08/27/23  2:11 PM  Result Value Ref Range Status   Enterococcus faecalis NOT DETECTED NOT DETECTED Final   Enterococcus Faecium NOT DETECTED NOT DETECTED Final   Listeria monocytogenes NOT DETECTED NOT DETECTED Final   Staphylococcus species NOT DETECTED NOT DETECTED Final   Staphylococcus aureus (BCID) NOT DETECTED NOT DETECTED Final   Staphylococcus epidermidis NOT DETECTED NOT DETECTED Final   Staphylococcus lugdunensis NOT DETECTED NOT DETECTED Final   Streptococcus species NOT DETECTED NOT DETECTED Final   Streptococcus agalactiae NOT DETECTED NOT DETECTED Final   Streptococcus pneumoniae NOT DETECTED NOT DETECTED Final   Streptococcus pyogenes NOT DETECTED NOT DETECTED Final   A.calcoaceticus-baumannii NOT DETECTED NOT DETECTED Final   Bacteroides fragilis NOT DETECTED NOT DETECTED Final   Enterobacterales DETECTED (A) NOT DETECTED Final    Comment: Enterobacterales represent a large order of gram negative bacteria, not a single organism. CRITICAL RESULT CALLED TO, READ BACK BY AND VERIFIED WITH: J LEDFORD,[email or web address]  08/28/23 MK    Enterobacter cloacae  complex DETECTED (A) NOT DETECTED Final    Comment: CRITICAL RESULT CALLED TO, READ BACK BY AND VERIFIED WITH: J LEDFORD,[email or web address]  08/28/23 MK    Escherichia coli NOT DETECTED NOT DETECTED Final   Klebsiella aerogenes NOT DETECTED NOT DETECTED Final   Klebsiella oxytoca NOT DETECTED NOT DETECTED Final   Klebsiella pneumoniae NOT DETECTED NOT DETECTED Final   Proteus species NOT DETECTED NOT DETECTED Final   Salmonella species NOT DETECTED NOT DETECTED Final   Serratia marcescens NOT DETECTED NOT DETECTED Final   Haemophilus influenzae NOT DETECTED NOT DETECTED Final   Neisseria meningitidis NOT DETECTED NOT DETECTED Final   Pseudomonas aeruginosa NOT DETECTED NOT DETECTED Final   Stenotrophomonas maltophilia NOT DETECTED NOT DETECTED Final   Candida albicans NOT DETECTED NOT DETECTED Final   Candida auris NOT DETECTED NOT DETECTED Final   Candida glabrata NOT DETECTED NOT DETECTED Final   Candida krusei NOT DETECTED NOT DETECTED Final   Candida parapsilosis NOT DETECTED NOT DETECTED Final   Candida tropicalis NOT DETECTED NOT DETECTED Final   Cryptococcus neoformans/gattii NOT DETECTED NOT DETECTED Final   CTX-M ESBL NOT DETECTED NOT DETECTED Final   Carbapenem resistance IMP NOT DETECTED NOT DETECTED Final   Carbapenem resistance KPC NOT DETECTED NOT DETECTED Final   Carbapenem resistance NDM NOT DETECTED NOT DETECTED Final   Carbapenem resist OXA 48 LIKE NOT DETECTED NOT DETECTED Final   Carbapenem resistance VIM NOT DETECTED NOT DETECTED Final    Comment: Performed at [facility], [address] [place], [place] [address]    Radiology Studies: No results found.    Joven Mom T. Hayze Gazda [facility]  If 7PM-7AM, please contact night-coverage [email or web address] 08/28/2023, 11:07 AM

## 2023-08-28 NOTE — Plan of Care (Signed)

## 2023-08-28 NOTE — Progress Notes (Signed)
 [facility]    Date of Admission:  08/27/2023   Total days of antibiotics 2   ID: Andrew Larsen is a 83 y.o. 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 He states that his pain to left ankle is tolerable  He is POD#1 from I x D of deep abscess,   Medications:   clopidogrel   75 mg Oral Daily   dorzolamide   1 drop Right Eye BID   [START ON 08/29/2023] enoxaparin  (LOVENOX ) injection  60 mg Subcutaneous Q24H   ezetimibe   10 mg Oral QPC supper   furosemide   40 mg Oral Daily   gabapentin   600 mg Oral TID   insulin  aspart  0-15 Units Subcutaneous TID WC   insulin  aspart protamine- aspart  25 Units Subcutaneous BID WC   latanoprost   1 drop Both Eyes QHS   losartan   25 mg Oral Daily   metoprolol  tartrate  25 mg Oral BID   multivitamin with minerals  1 tablet Oral Q breakfast   [START ON 09/01/2023] rosuvastatin   10 mg Oral Q Sun   senna-docusate  2 tablet Oral BID   Followed by   Cecily Cohen ON 08/29/2023] senna-docusate  1 tablet Oral BID   tamsulosin   0.4 mg Oral Daily   timolol   1 drop Right Eye BID    Objective: Vital signs in last 24 hours: Temp:  [97.3 F (36.3 C)-98.4 F (36.9 C)] 98.1 F (36.7 C) (04/30 1429) Pulse Rate:  [68-101] 75 (04/30 1429) Resp:  [16-19] 18 (04/30 1429) BP: (107-152)/(56-78) 107/56 (04/30 1429) SpO2:  [91 %-100  %] 100 % (04/30 1429)  Physical Exam  Constitutional: He is oriented to person, place, and time. He appears well-developed and well-nourished. No distress.  HENT:  Mouth/Throat: Oropharynx is clear and moist. No oropharyngeal exudate.  Cardiovascular: Normal rate, regular rhythm and normal heart sounds. Exam reveals no gallop and no friction rub.  No murmur heard.  Pulmonary/Chest: Effort normal and breath sounds normal. No respiratory distress. He has no wheezes.  Ext: left foot is wrapped with wound vac in place Psychiatric: He has a normal mood and affect. His behavior is normal.    Lab Results Recent Labs    08/27/23 1404 08/28/23 0614  WBC 5.3 9.2  HGB 13.5 13.0  HCT 40.3 38.1*  NA  --  133*  K  --  4.5  CL  --  99  CO2  --  24  BUN  --  23  CREATININE 1.21 1.24   Liver Panel Recent Labs    08/28/23 0614  PROT 6.9  ALBUMIN 3.1*  AST 28  ALT 29  ALKPHOS 101  BILITOT 0.7   Sedimentation Rate Recent Labs    08/28/23 0614  ESRSEDRATE 42*   C-Reactive Protein Recent Labs    08/28/23 [phone]  CRP 4.8*    Microbiology: Blood cx 4/29 with 1 of 4 bottles + GNR--enterobacter Wound cx 4/29: enterobacter Studies/Results: DG MINI C-ARM IMAGE ONLY Result Date: 08/27/2023 There is no interpretation for this exam.  This order is for images obtained during a surgical procedure.  Please See "Surgeries" Tab for more information regarding the procedure.     Assessment/Plan:  83yo M with left ankle deep tissue abscess +/- OM - found to have enterobacter infection with secondary bacteremia  - we will narrow abtx to cefepime - will discontinue gram positive coverage - await to see sensitivities of enterobacter isolate to decide on abtx and treatment course  Wound vac per dr [name] for management  T2DM = encourage good control to help with wound healing  I have personally spent 50 minutes involved in face-to-face and non-face-to-face activities for this patient on  the day of the visit. Professional time spent includes the following activities: Preparing to see the patient (review of tests), Obtaining and/or reviewing separately obtained history (admission/discharge record), Performing a medically appropriate examination and/or evaluation , Ordering medications/tests/procedures, referring and communicating with other health care professionals, Documenting clinical information in the EMR, Independently interpreting results (not separately reported), Communicating results to the patient/family/caregiver, Counseling and educating the patient/family/caregiver and Care coordination (not separately reported).      [facility] for Infectious Diseases Pager: [phone]  08/28/2023, 3:21 PM

## 2023-08-28 NOTE — Progress Notes (Signed)
 PHARMACY ANTIBIOTIC CONSULT NOTE   Andrew Larsen a 83 y.o. male with infection of prosthesis in L ankle w/ abscess s/p debridement and hardware removal 4/29.  Pharmacy has been consulted for daptomycin dosing.  Of note patient is on Crestor  qSunday.  Now with Enterobacter bacteremia.  Renal function remains stable, CK WNL.    Plan: Continue Cubicin 700 mg IV Q24H (CrCl>30, Adj BW ~8 mg/KG for BMI>30)  Cefepime 2gm IV Q8H Monitor renal function, weekly CK  Allergies:  Allergies  Allergen Reactions   Metformin And Related Shortness Of Breath   Pollen Extract Itching and Other (See Comments)    Some wheezing, itchy eyes, runny nose   Simbrinza [Brinzolamide-Brimonidine ] Other (See Comments)    Drainage, redness to eyes    [facility] Weights   08/26/23 1335 08/27/23 0805  Weight: 118.8 kg (261 lb 14.5 oz) 118.8 kg (262 lb)       Latest Ref Rng & Units 08/28/2023    6:14 AM 08/27/2023    2:04 PM 07/23/2023    4:47 AM  CBC  WBC 4.0 - 10.5 K/uL 9.2  5.3  8.4   Hemoglobin 13.0 - 17.0 g/dL 16.1  09.6  04.5   Hematocrit 39.0 - 52.0 % 38.1  40.3  38.9   Platelets 150 - 400 K/uL 217  184  173     Antibiotics Given (last 72 hours)     Date/Time Action Medication Dose Rate   08/27/23 1115 Given  [exp 09/2024]   vancomycin  (VANCOCIN ) powder 1,000 mg    08/27/23 1807 New Bag/Given   cefTRIAXone  (ROCEPHIN ) 2 g in sodium chloride  0.9 % 100 mL IVPB 2 g 200 mL/hr   08/27/23 1859 New Bag/Given   DAPTOmycin (CUBICIN) IVPB 700 mg/160mL premix 700 mg 200 mL/hr   08/28/23 0847 New Bag/Given   ceFEPIme (MAXIPIME) 2 g in sodium chloride  0.9 % 100 mL IVPB 2 g 200 mL/hr       Cubicin 4/29 >> Rocephin  4/29 >> 4/30 Cefepime 4/30 >>    4/30 CK = 288   4/29 BCx - Enterobacter cloacae  4/19 wound -   Elchonon Maxson D. Marikay Show, PharmD, BCPS, BCCCP 08/28/2023, 9:34 AM

## 2023-08-28 NOTE — Progress Notes (Signed)
 PHARMACY - PHYSICIAN COMMUNICATION CRITICAL VALUE ALERT - BLOOD CULTURE IDENTIFICATION (BCID)  Andrew Larsen is an 83 y.o. male who presented to [facility] on 08/27/2023 with a chief complaint of left ankle hardware infection   Name of physician (or Provider) Contacted: Dr. Mae Schlossman  Current antibiotics: Daptomycin, Ceftriaxone    Changes to prescribed antibiotics recommended:  Continue current anti-biotics  ID following  ?wait on wound cultures to finalize before de-escalation   Results for orders placed or performed during the hospital encounter of 08/27/23  Blood Culture ID Panel (Reflexed) (Collected: 08/27/2023  2:11 PM)  Result Value Ref Range   Enterococcus faecalis NOT DETECTED NOT DETECTED   Enterococcus Faecium NOT DETECTED NOT DETECTED   Listeria monocytogenes NOT DETECTED NOT DETECTED   Staphylococcus species NOT DETECTED NOT DETECTED   Staphylococcus aureus (BCID) NOT DETECTED NOT DETECTED   Staphylococcus epidermidis NOT DETECTED NOT DETECTED   Staphylococcus lugdunensis NOT DETECTED NOT DETECTED   Streptococcus species NOT DETECTED NOT DETECTED   Streptococcus agalactiae NOT DETECTED NOT DETECTED   Streptococcus pneumoniae NOT DETECTED NOT DETECTED   Streptococcus pyogenes NOT DETECTED NOT DETECTED   A.calcoaceticus-baumannii NOT DETECTED NOT DETECTED   Bacteroides fragilis NOT DETECTED NOT DETECTED   Enterobacterales DETECTED (A) NOT DETECTED   Enterobacter cloacae complex DETECTED (A) NOT DETECTED   Escherichia coli NOT DETECTED NOT DETECTED   Klebsiella aerogenes NOT DETECTED NOT DETECTED   Klebsiella oxytoca NOT DETECTED NOT DETECTED   Klebsiella pneumoniae NOT DETECTED NOT DETECTED   Proteus species NOT DETECTED NOT DETECTED   Salmonella species NOT DETECTED NOT DETECTED   Serratia marcescens NOT DETECTED NOT DETECTED   Haemophilus influenzae NOT DETECTED NOT DETECTED   Neisseria meningitidis NOT DETECTED NOT DETECTED   Pseudomonas aeruginosa NOT DETECTED  NOT DETECTED   Stenotrophomonas maltophilia NOT DETECTED NOT DETECTED   Candida albicans NOT DETECTED NOT DETECTED   Candida auris NOT DETECTED NOT DETECTED   Candida glabrata NOT DETECTED NOT DETECTED   Candida krusei NOT DETECTED NOT DETECTED   Candida parapsilosis NOT DETECTED NOT DETECTED   Candida tropicalis NOT DETECTED NOT DETECTED   Cryptococcus neoformans/gattii NOT DETECTED NOT DETECTED   CTX-M ESBL NOT DETECTED NOT DETECTED   Carbapenem resistance IMP NOT DETECTED NOT DETECTED   Carbapenem resistance KPC NOT DETECTED NOT DETECTED   Carbapenem resistance NDM NOT DETECTED NOT DETECTED   Carbapenem resist OXA 48 LIKE NOT DETECTED NOT DETECTED   Carbapenem resistance VIM NOT DETECTED NOT DETECTED    [name] 08/28/2023  7:05 AM

## 2023-08-29 ENCOUNTER — Other Ambulatory Visit: Payer: Self-pay

## 2023-08-29 DIAGNOSIS — B9689 Other specified bacterial agents as the cause of diseases classified elsewhere: Secondary | ICD-10-CM | POA: Diagnosis not present

## 2023-08-29 DIAGNOSIS — T8459XA Infection and inflammatory reaction due to other internal joint prosthesis, initial encounter: Secondary | ICD-10-CM | POA: Diagnosis not present

## 2023-08-29 DIAGNOSIS — R7881 Bacteremia: Secondary | ICD-10-CM | POA: Diagnosis not present

## 2023-08-29 DIAGNOSIS — T8454XA Infection and inflammatory reaction due to internal left knee prosthesis, initial encounter: Secondary | ICD-10-CM | POA: Diagnosis not present

## 2023-08-29 DIAGNOSIS — Z96662 Presence of left artificial ankle joint: Secondary | ICD-10-CM | POA: Diagnosis not present

## 2023-08-29 LAB — CBC
HCT: 40.2 % (ref 39.0–52.0)
Hemoglobin: 13.2 g/dL (ref 13.0–17.0)
MCH: 30.5 pg (ref 26.0–34.0)
MCHC: 32.8 g/dL (ref 30.0–36.0)
MCV: 92.8 fL (ref 80.0–100.0)
Platelets: 203 10*3/uL (ref 150–400)
RBC: 4.33 MIL/uL (ref 4.22–5.81)
RDW: 13.8 % (ref 11.5–15.5)
WBC: 6.1 10*3/uL (ref 4.0–10.5)
nRBC: 0 % (ref 0.0–0.2)

## 2023-08-29 LAB — COMPREHENSIVE METABOLIC PANEL WITH GFR
ALT: 24 U/L (ref 0–44)
AST: 27 U/L (ref 15–41)
Albumin: 3.3 g/dL — ABNORMAL LOW (ref 3.5–5.0)
Alkaline Phosphatase: 94 U/L (ref 38–126)
Anion gap: 12 (ref 5–15)
BUN: 26 mg/dL — ABNORMAL HIGH (ref 8–23)
CO2: 23 mmol/L (ref 22–32)
Calcium: 9 mg/dL (ref 8.9–10.3)
Chloride: 100 mmol/L (ref 98–111)
Creatinine, Ser: 1.3 mg/dL — ABNORMAL HIGH (ref 0.61–1.24)
GFR, Estimated: 56 mL/min — ABNORMAL LOW (ref >60)
Glucose, Bld: 111 mg/dL — ABNORMAL HIGH (ref 70–99)
Potassium: 4.4 mmol/L (ref 3.5–5.1)
Sodium: 135 mmol/L (ref 135–145)
Total Bilirubin: 0.7 mg/dL (ref 0.0–1.2)
Total Protein: 7 g/dL (ref 6.5–8.1)

## 2023-08-29 LAB — GLUCOSE, CAPILLARY
Glucose-Capillary: 103 mg/dL — ABNORMAL HIGH (ref 70–99)
Glucose-Capillary: 164 mg/dL — ABNORMAL HIGH (ref 70–99)
Glucose-Capillary: 180 mg/dL — ABNORMAL HIGH (ref 70–99)
Glucose-Capillary: 134 mg/dL — ABNORMAL HIGH (ref 70–99)

## 2023-08-29 LAB — MAGNESIUM: Magnesium: 2.2 mg/dL (ref 1.7–2.4)

## 2023-08-29 MED ORDER — OXYCODONE HCL 5 MG PO TABS: 5.0000 mg | ORAL_TABLET | Freq: Three times a day (TID) | ORAL | 0 refills | Status: DC | PRN

## 2023-08-29 MED ORDER — CEFEPIME IV (FOR PTA / DISCHARGE USE ONLY): 2.0000 g | Freq: Two times a day (BID) | INTRAVENOUS | 0 refills | Status: DC

## 2023-08-29 MED ORDER — SODIUM CHLORIDE 0.9 % IV SOLN
2.0000 g | Freq: Two times a day (BID) | INTRAVENOUS | Status: DC
  Administered 2023-08-29 – 2023-08-30 (×2): 2 g via INTRAVENOUS
  Filled 2023-08-29 (×2): qty 12.5

## 2023-08-29 NOTE — Plan of Care (Signed)
  Problem: Education: Goal: Knowledge of General Education information will improve Description: Including pain rating scale, medication(s)/side effects and non-pharmacologic comfort measures Outcome: Progressing   Problem: Health Behavior/Discharge Planning: Goal: Ability to manage health-related needs will improve Outcome: Progressing   Problem: Activity: Goal: Risk for activity intolerance will decrease Outcome: Progressing   Problem: Nutrition: Goal: Adequate nutrition will be maintained Outcome: Progressing   Problem: Elimination: Goal: Will not experience complications related to bowel motility Outcome: Progressing   Problem: Pain Managment: Goal: General experience of comfort will improve and/or be controlled Outcome: Progressing   Problem: Elimination: Goal: Will not experience complications related to urinary retention Outcome: Progressing

## 2023-08-29 NOTE — Plan of Care (Signed)

## 2023-08-29 NOTE — Progress Notes (Signed)
 PHARMACY CONSULT NOTE FOR:  OUTPATIENT  PARENTERAL ANTIBIOTIC THERAPY (OPAT)  Indication: Enterobacter cloacae bacteremia and left ankle osteomyelitis  Regimen: Cefepime  2 gm IV Q 12 hours End date: 10/09/23  IV antibiotic discharge orders are pended. To discharging provider:  please sign these orders via discharge navigator,  Select New Orders & click on the button choice - Manage This Unsigned Work.     Thank you for allowing pharmacy to be a part of this patient's care.  Denson Flake, PharmD, BCPS, BCIDP Infectious Diseases Clinical Pharmacist Phone: [phone] 08/29/2023, 1:38 PM

## 2023-08-29 NOTE — Progress Notes (Addendum)
 Subjective: Patient reports pain as moderate. Medicines help. Tolerating diet.  Urinating.   No CP, SOB.  Has mobilized OOB with PT some but seems there was miscommunication on his WB status based on what the orders said and what Dr. Erma Larsen operative note stated he wanted.   Objective:   VITALS:   Vitals:   08/28/23 2227 08/29/23 0435 08/29/23 0759 08/29/23 1525  BP: 136/70 119/68 (!) 150/60 138/62  Pulse: 77 75 78 87  Resp:  18 18 18   Temp:  98.2 F (36.8 C) 98 F (36.7 C) 97.8 F (36.6 C)  TempSrc:  Oral Oral Oral  SpO2:  98% 100% 100%  Weight:      Height:          Latest Ref Rng & Units 08/29/2023    5:42 AM 08/28/2023    6:14 AM 08/27/2023    2:04 PM  CBC  WBC 4.0 - 10.5 K/uL 6.1  9.2  5.3   Hemoglobin 13.0 - 17.0 g/dL 40.9  81.1  91.4   Hematocrit 39.0 - 52.0 % 40.2  38.1  40.3   Platelets 150 - 400 K/uL 203  217  184       Latest Ref Rng & Units 08/29/2023    5:42 AM 08/28/2023    6:14 AM 08/27/2023    2:04 PM  BMP  Glucose 70 - 99 mg/dL 782  956    BUN 8 - 23 mg/dL 26  23    Creatinine 2.13 - 1.24 mg/dL 0.86  5.78  4.69   Sodium 135 - 145 mmol/L 135  133    Potassium 3.5 - 5.1 mmol/L 4.4  4.5    Chloride 98 - 111 mmol/L 100  99    CO2 22 - 32 mmol/L 23  24    Calcium  8.9 - 10.3 mg/dL 9.0  9.0     Intake/Output      04/30 0701 05/01 0700 05/01 0701 05/02 0700   P.O.  360   I.V. (mL/kg)     IV Piggyback     Total Intake(mL/kg)  360 (3)   Urine (mL/kg/hr) 800 (0.3) 1300 (1)   Drains 25    Stool  2   Blood     Total Output 825 1302   Net -825 -942        Stool Occurrence  2 x      Physical Exam: General: NAD.  Sitting up on edge of bed, calm, comfortable Resp: No increased wob Cardio: regular rate and rhythm ABD soft Neurologically intact MSK Neurovascularly intact Sensation intact distally Intact pulses distally Dorsiflexion/Plantar flexion intact  Can wiggle toes Incision: dressing C/D/I Wound vac on and functioning with <50cc bloody  fluid in the canister CAM boot L ankle     Assessment: 2 Days Post-Op  S/P Procedure(s) (LRB): INCISION AND DRAINAGE OF DEEP ABSCESS, ANKLE (Left) CREATION, FLAP, ROTATION (Left) REMOVAL, HARDWARE (Left) APPLICATION, WOUND VAC (Left) APPLICATION, ALLOGRAFT, SKIN (Left) by Dr. Deloras Larsen. 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) Active Problems:   DMII (diabetes mellitus, type 2) (HCC)   Essential hypertension   Inclusion body myositis   Hypoalbuminemia due to protein-calorie malnutrition (HCC)   Stage 3a chronic kidney disease (CKD) (HCC)   Malignant neoplasm of prostate (HCC)   OSA (obstructive sleep apnea)   BPH (benign prostatic hyperplasia)   ILD (interstitial lung disease) (HCC)   Bacteremia due to  Enterobacter species    Plan: Wound culture growing Enterobacter   Blood culture growing same ABX per ID  Advance diet Up with therapy Incentive Spirometry Elevate and Apply ice  Weightbearing: TDWB LLE, have adjusted the orders to reflect this Insicional and dressing care: Dressings left intact until follow-up and Reinforce dressings as needed Orthopedic device(s): Wound ZOX:WRUE lateral ankle and CAM boot Showering: Keep dressing dry VTE prophylaxis:  Lovenox  30mg  daily , SCDs, ambulation Pain control: PRN Follow - up plan: 09/02/23 at 3:30pm Contact information:  Andrew Bury MD, Andrew Solan PA-C  Dispo: Skilled Nursing Facility/Rehab once cleared by ID and medically ready. Seems like plans in place for PICC line to be placed tomorrow. Ok to d/c to SNF from ortho standpoint.     Andrew Rode, PA-C Office [phone] 08/29/2023, 5:34 PM

## 2023-08-29 NOTE — Progress Notes (Signed)
 PHARMACY NOTE:  ANTIMICROBIAL RENAL DOSAGE ADJUSTMENT  Current antimicrobial regimen includes a mismatch between antimicrobial dosage and estimated renal function.  As per policy approved by the Pharmacy & Therapeutics and Medical Executive Committees, the antimicrobial dosage will be adjusted accordingly.  Current antimicrobial dosage:  Cefepime  2 gm IV Q 8 hours  Indication: E Cloacae bacteremia and osteomyelitis   Renal Function:  Estimated Creatinine Clearance: 59.4 mL/min (A) (by C-G formula based on SCr of 1.3 mg/dL (H)). []      On intermittent HD, scheduled: []      On CRRT    Antimicrobial dosage has been changed to:  Cefepime  2 gm IV Q 12 hours   Additional comments:   Thank you for allowing pharmacy to be a part of this patient's care.  Denson Flake, PharmD, BCPS, BCIDP Infectious Diseases Clinical Pharmacist Phone: [phone] 08/29/2023 1:42 PM

## 2023-08-29 NOTE — Progress Notes (Addendum)
 PICC placement order noted. ID clearance noted. VAST will attempt to place PICC tonight.

## 2023-08-29 NOTE — Progress Notes (Signed)
  Inpatient Rehab Admissions Coordinator :  Per Family request per [facility] SW, patient was screened for CIR candidacy by Jeannetta Millman RN MSN. Patient is not at a level to tolerate the intensity required to pursue a CIR admit for short term rehab. Recommend other rehab venues to be pursued at this time. Please contact me with any questions.  Jeannetta Millman RN MSN Admissions Coordinator [phone]

## 2023-08-29 NOTE — Progress Notes (Signed)
   08/29/23 2240  BiPAP/CPAP/SIPAP  Reason BIPAP/CPAP not in use Other(comment) (Pt unable to tolerate CPAP pressure of 4. Pt CPAP not in use at this time.)

## 2023-08-29 NOTE — Discharge Summary (Signed)
 Physician Discharge Summary  Patient ID: Andrew Larsen MRN: [id number] DOB/AGE: 07/24/1940 83 y.o.  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) Active Problems:   DMII (diabetes mellitus, type 2) (HCC)   Essential hypertension   Inclusion body myositis   Hypoalbuminemia due to protein-calorie malnutrition (HCC)   Stage 3a chronic kidney disease (CKD) (HCC)   Malignant neoplasm of prostate (HCC)   OSA (obstructive sleep apnea)   BPH (benign prostatic hyperplasia)   ILD (interstitial lung disease) (HCC)   Bacteremia due to Enterobacter species   Past Medical History:  Diagnosis Date   Arthritis    CHF (congestive heart failure) (HCC)    per pt's daughter   CKD (chronic kidney disease)    CKD stage III (01/2018)   Daytime somnolence    Diabetes mellitus without complication (HCC)    Fracture    right fibula/wears boot cast   GERD (gastroesophageal reflux disease)    Glaucoma    Hypercholesteremia    Hyperlipidemia    Hypertension    Inclusion body myositis    diagnosed 2020   Mold exposure    7 yrs ago   PAD (peripheral artery disease) (HCC)    Prostate cancer (HCC)    Sleep apnea    uses cpap   Uses walker    Wears glasses     Surgeries: Procedure(s): INCISION AND DRAINAGE OF DEEP ABSCESS, ANKLE CREATION, FLAP, ROTATION REMOVAL, HARDWARE APPLICATION, WOUND VAC APPLICATION, ALLOGRAFT, SKIN on 08/27/2023   Consultants (if any): Treatment Team:  Gonfa, Taye T, MD  Discharged Condition: Improved  Hospital Course: ANIS KENDALL is an 83 y.o. 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    He was given  perioperative antibiotics:  Anti-infectives (From admission, onward)    Start     Dose/Rate Route Frequency Ordered Stop   08/29/23 2200  ceFEPIme  (MAXIPIME ) 2 g in sodium chloride  0.9 % 100 mL IVPB        2 g 200 mL/hr over 30 Minutes Intravenous Every 12 hours 08/29/23 1339     08/29/23 0000  ceFEPime  (MAXIPIME ) IVPB        2 g Intravenous Every 12 hours 08/29/23 1341 10/09/23 2359   08/28/23 1000  ceFEPIme  (MAXIPIME ) 2 g in sodium chloride  0.9 % 100 mL IVPB  Status:  Discontinued        2 g 200 mL/hr over 30 Minutes Intravenous Every 8 hours 08/28/23 0826 08/29/23 1339   08/27/23 1730  DAPTOmycin  (CUBICIN ) IVPB 700 mg/100mL premix  Status:  Discontinued        8 mg/kg  92.7 kg (Adjusted) 200 mL/hr over 30 Minutes Intravenous Daily 08/27/23 1642 08/28/23 1019   08/27/23 1715  cefTRIAXone  (ROCEPHIN ) 2 g in sodium chloride  0.9 % 100 mL IVPB  Status:  Discontinued        2 g 200 mL/hr over 30 Minutes Intravenous Every 24 hours 08/27/23 1622 08/28/23 0826   08/27/23 1115  vancomycin  (VANCOCIN ) powder  Status:  Discontinued          As needed 08/27/23 1121 08/27/23 1147     .  He was given sequential compression devices, early ambulation, and Lovenox   for DVT prophylaxis.  He benefited maximally from the hospital stay and there were no complications.    Recent vital signs:  Vitals:   08/30/23 0345 08/30/23 0734  BP: (!) 153/68 111/66  Pulse: 73 94  Resp: 17 17  Temp: 98 F (36.7 C) 98.4 F (36.9 C)  SpO2: 100% 100%    Recent laboratory studies:  Lab Results  Component Value Date   HGB 13.2 08/29/2023   HGB 13.0 08/28/2023   HGB 13.5 08/27/2023   Lab Results  Component Value Date   WBC 6.1 08/29/2023   PLT 203 08/29/2023   Lab Results  Component Value Date   INR 1.0 07/21/2023   Lab Results  Component Value Date   NA 135 08/29/2023   K 4.4 08/29/2023   CL 100 08/29/2023   CO2 23 08/29/2023   BUN 26 (H) 08/29/2023   CREATININE 1.30 (H) 08/29/2023   GLUCOSE  111 (H) 08/29/2023    Discharge Medications:   Allergies as of 08/30/2023       Reactions   Metformin And Related Shortness Of Breath   Pollen Extract Itching, Other (See Comments)   Some wheezing, itchy eyes, runny nose   Simbrinza [brinzolamide-brimonidine ] Other (See Comments)   Drainage, redness to eyes        Medication List     STOP taking these medications    cephALEXin 500 MG capsule Commonly known as: KEFLEX       TAKE these medications    aspirin  EC 81 MG tablet Take 1 tablet (81 mg total) by mouth daily. Swallow whole. What changed: when to take this   ceFEPime  IVPB Commonly known as: MAXIPIME  Inject 2 g into the vein every 12 (twelve) hours. Indication:  E cloacae osteomyelitis and bacteremia First Dose: Yes Last Day of Therapy:  10/09/23 Labs - Once weekly:  CBC/D and BMP, Labs - Once weekly: ESR and CRP   clopidogrel  75 MG tablet Commonly known as: PLAVIX  Take 1 tablet (75 mg total) by mouth daily.   docusate sodium  100 MG capsule Commonly known as: COLACE Take 1 capsule (100 mg total) by mouth 2 (two) times daily as needed for mild constipation.   dorzolamide  2 % ophthalmic solution Commonly known as: TRUSOPT  Place 1 drop into the right eye 2 (two) times daily.   ezetimibe  10 MG tablet Commonly known as: ZETIA  TAKE 1 TABLET(10 MG) BY MOUTH DAILY AFTER SUPPER   FreeStyle Libre 2 Sensor Misc Inject 1 Device into the skin every 14 (fourteen) days.   furosemide  40 MG tablet Commonly known as: LASIX  Take 1 tablet (40 mg total) by mouth daily.   gabapentin  300 MG capsule Commonly known as: NEURONTIN  Take 2 capsules (600 mg total) by mouth 3 (three) times daily.   latanoprost  0.005 % ophthalmic solution Commonly known as: XALATAN  Place 1 drop into both eyes at bedtime.   losartan  25 MG tablet Commonly known as: COZAAR  TAKE 1 TABLET(25 MG) BY MOUTH DAILY   melatonin 5 MG Tabs Take 5 mg by mouth at bedtime.   metoprolol  tartrate 25 MG  tablet Commonly known as: LOPRESSOR  Take 1 tablet (25 mg total) by mouth 2 (two) times daily.   Mounjaro 2.5 MG/0.5ML Pen Generic drug: tirzepatide 2.5mg  Subcutaneous once aweek for 28 days   multivitamin with minerals Tabs tablet Take 1 tablet by mouth daily with breakfast.   NovoLIN 70/30 (70-30) 100 UNIT/ML injection Generic drug: insulin  NPH-regular Human Inject 50 Units into the skin in the  morning and at bedtime.   Oxycodone  HCl 10 MG Tabs Take 1 tablet (10 mg total) by mouth every 6 (six) hours. What changed:  when to take this reasons to take this   oxyCODONE  5 MG immediate release tablet Commonly known as: Roxicodone  Take 1 tablet (5 mg total) by mouth every 8 (eight) hours as needed for severe pain (pain score 7-10). after repeat ankle surgery What changed: You were already taking a medication with the same name, and this prescription was added. Make sure you understand how and when to take each.   polyethylene glycol 17 g packet Commonly known as: MIRALAX  / GLYCOLAX  Take 17 g by mouth daily as needed.   PRESCRIPTION MEDICATION CPAP- At bedtime   rosuvastatin  10 MG tablet Commonly known as: CRESTOR  Take 10 mg by mouth every Sunday.   senna 8.6 MG Tabs tablet Commonly known as: SENOKOT Take 2 tablets by mouth at bedtime.   tamsulosin 0.4 MG Caps capsule Commonly known as: FLOMAX Take 0.4 mg by mouth daily.   timolol 0.5 % ophthalmic solution Commonly known as: TIMOPTIC Place 1 drop into the right eye 2 (two) times daily.               Home Infusion Instuctions  (From admission, onward)           Start     Ordered   08/29/23 0000  Home infusion instructions       Question:  Instructions  Answer:  Flushing of vascular access device: 0.9% NaCl pre/post medication administration and prn patency; Heparin 100 u/ml, 5ml for implanted ports and Heparin 10u/ml, 5ml for all other central venous catheters.   08/29/23 1341               Discharge Care Instructions  (From admission, onward)           Start     Ordered   08/30/23 0000  Touch down weight bearing       Question Answer Comment  Laterality left   Extremity Lower      05 /02/25 1351            Diagnostic Studies: US  EKG SITE RITE Result Date: 08/29/2023 If Site Rite image not attached, placement could not be confirmed due to current cardiac rhythm.  DG MINI C-ARM IMAGE ONLY Result Date: 08/27/2023 There is no interpretation for this exam.  This order is for images obtained during a surgical procedure.  Please See "Surgeries" Tab for more information regarding the procedure.    Disposition: Discharge disposition: 03-Skilled Nursing Facility       Discharge Instructions     Call MD / Call 911   Complete by: As directed    If you experience chest pain or shortness of breath, CALL 911 and be transported to the hospital emergency room.  If you develope a fever above 101 F, pus (white drainage) or increased drainage or redness at the wound, or calf pain, call your surgeon's office.   Diet - low sodium heart healthy   Complete by: As directed    Discharge instructions   Complete by: As directed    It is very important for you to Elevate your leg on pillows - "Toes above the nose" as much as possible to reduce pain / swelling. It is not uncommon to have pain, swelling, and bruising to the entire leg from the thigh to the toes. Sometimes this can also cause temporary numbness that will get better as  swelling decreases.     Weight Bearing:  Touch down weight bearing affected leg while in CAM boot. You can use a walker or wheelchair to help with mobilization.   Diet: As you were doing prior to hospitalization. Be sure to remain well hydrated.   Shower:  You have a wound vac on, leave the vac in place and keep the vac dry with a plastic bag. Sponge baths may be easier.  Dressing:  Leave the dressings in place and we will change your bandages during  your first follow-up appointment.  You may loosen and re-apply ace wrap if it feels too tight.    Activity:  Increase activity slowly as tolerated, but follow the weight bearing instructions above.  The rules on driving is that you can not be taking narcotics while you drive, and you must feel in control of the vehicle.    Medicines:  - Tylenol  is for mild to moderate pain relief. - Oxycodone  is a narcotic for severe pain relief. Take this as little as possible and stop it as soon as possible. - Zofran  is for nausea and vomiting. - Aspirin  is for prevent blood clots after surgery. - The antibiotic given through the PICC line is to fight the infection in your blood and surgical wound.    To prevent constipation:  Narcotic medicines cause constipation.  Wean these as soon as is appropriate.   You may use a stool softener such as -  Colace (over the counter) 100 mg by mouth twice a day  Drink plenty of fluids (prune juice may be helpful) and high fiber foods Miralax  (over the counter) for constipation as needed.    Itching:  If you experience itching with your medications, try taking only a single pain pill, or even half a pain pill at a time.  You can also use benadryl  over the counter for itching or also to help with sleep.   Precautions:  If you experience chest pain or shortness of breath - call 911 immediately for transfer to the hospital emergency department!!  If you develop a fever greater that 101 F, purulent drainage from wound, increased redness or drainage from wound, or calf pain -- Call the office at [phone]                                                 Follow- Up Appointment:  Please call for an appointment to be seen in 1-2 weeks - [phone]   Home infusion instructions   Complete by: As directed    Instructions: Flushing of vascular access device: 0.9% NaCl pre/post medication administration and prn patency; Heparin  100 u/ml, 5ml for implanted ports and Heparin   10u/ml, 5ml for all other central venous catheters.   Post-operative opioid taper instructions:   Complete by: As directed    POST-OPERATIVE OPIOID TAPER INSTRUCTIONS: It is important to wean off of your opioid medication as soon as possible. If you do not need pain medication after your surgery it is ok to stop day one. Opioids include: Codeine, Hydrocodone (Norco, Vicodin), Oxycodone (Percocet, oxycontin ) and hydromorphone  amongst others.  Long term and even short term use of opiods can cause: Increased pain response Dependence Constipation Depression Respiratory depression And more.  Withdrawal symptoms can include Flu like symptoms Nausea, vomiting And more Techniques to manage these symptoms Hydrate well Eat regular healthy  meals Stay active Use relaxation techniques(deep breathing, meditating, yoga) Do Not substitute Alcohol  to help with tapering If you have been on opioids for less than two weeks and do not have pain than it is ok to stop all together.  Plan to wean off of opioids This plan should start within one week post op of your joint replacement. Maintain the same interval or time between taking each dose and first decrease the dose.  Cut the total daily intake of opioids by one tablet each day Next start to increase the time between doses. The last dose that should be eliminated is the evening dose.      Touch down weight bearing   Complete by: As directed    Laterality: left   Extremity: Lower        Follow-up Information     Saundra Curl, MD. Go on 09/02/2023.   Specialty: Orthopedic Surgery Why: Your appointment is at 3:30pm Contact information: [address] [place] [address] [phone]                  Signed: Ander Bame Office [phone] 08/30/2023, 1:51 PM

## 2023-08-29 NOTE — Progress Notes (Signed)
 Consent for bedside PICC placement obtained after discussing risks, benefits, and alternatives. Requested 5-2 am placement. Primary nurse Opoku in room and notified of 5-2 AM placement.

## 2023-08-29 NOTE — TOC Initial Note (Addendum)
 Transition of Care [facility]) - Initial/Assessment Note    Patient Details  Name: Andrew Larsen MRN: [id number] Date of Birth: 1940/07/08  Transition of Care [facility]) CM/SW Contact:    Elspeth Hals, LCSW Phone Number: 08/29/2023, 11:52 AM  Clinical Narrative:    CSW met with pt regarding PT recommendation for more SNF.  Pt confirms he was admitted from [facility].  He was not aware he would be going back.  Pt lives with daughter Adriana Hopping, permission given to speak with her.  Pt asked CSW to discuss DC plan with daughter.  CSW spoke with daughter Adriana Hopping by phone.  She confirmed pt has been STR at [facility].  She reports pt has also been at [facility] in the past and she would like to see if CIR is an option now.    CSW reached out to CIR to review.      1300:[facility] not able to offer bed.  CSW updated pt and daughter, who is now here.  They do want to move forward with plan for DC back to [facility].  CSW requested SNF auth from [facility].              Expected Discharge Plan: Skilled Nursing Facility Barriers to Discharge: Continued Medical Work up   Patient Goals and CMS Choice Patient states their goals for this hospitalization and ongoing recovery are:: be independent   Choice offered to / list presented to : Adult Children (daughter Adriana Hopping)      Expected Discharge Plan and Services In-house Referral: Clinical Social Work   Post Acute Care Choice:  (TBD) Living arrangements for the past 2 months: Single Family Home                                      Prior Living Arrangements/Services Living arrangements for the past 2 months: Single Family Home Lives with:: Adult Children (lives with daughter Adriana Hopping) Patient language and need for interpreter reviewed:: Yes Do you feel safe going back to the place where you live?: Yes      Need for Family Participation in Patient Care: Yes (Comment) Care giver support system in place?: Yes (comment) Current home services: Other (comment)  (none) Criminal Activity/Legal Involvement Pertinent to Current Situation/Hospitalization: No - Comment as needed  Activities of Daily Living   ADL Screening (condition at time of admission) Independently performs ADLs?: No Does the patient have a NEW difficulty with bathing/dressing/toileting/self-feeding that is expected to last >3 days?: No Does the patient have a NEW difficulty with getting in/out of bed, walking, or climbing stairs that is expected to last >3 days?: No Does the patient have a NEW difficulty with communication that is expected to last >3 days?: No Is the patient deaf or have difficulty hearing?: No Does the patient have difficulty seeing, even when wearing glasses/contacts?: No Does the patient have difficulty concentrating, remembering, or making decisions?: No  Permission Sought/Granted Permission sought to share information with : Family Supports Permission granted to share information with : Yes, Verbal Permission Granted  Share Information with NAME: daughter Adriana Hopping  Permission granted to share info w AGENCY: SNF        Emotional Assessment Appearance:: Appears stated age Attitude/Demeanor/Rapport: Engaged Affect (typically observed): Appropriate, Pleasant Orientation: : Oriented to Self, Oriented to Place, Oriented to  Time, Oriented to Situation      Admission diagnosis:  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) [T84.59XA, Z96.662] 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) 08/27/2023   ILD (interstitial lung disease) (HCC) 07/26/2023   PAD (peripheral artery disease) (HCC) 07/23/2023   OSA (obstructive sleep apnea) 07/23/2023   BPH (benign prostatic hyperplasia) 07/23/2023   Trimalleolar fracture of ankle, closed, left, initial encounter 07/20/2023   Malignant neoplasm of prostate (HCC) 11/04/2022    Complicated UTI (urinary tract infection) 09/06/2021   Cough 01/07/2019   Labile blood glucose    SOB (shortness of breath)    Urinary frequency    Hypoalbuminemia due to protein-calorie malnutrition (HCC)    Stage 3a chronic kidney disease (CKD) (HCC)    Physical debility 12/01/2018   Pressure injury of skin 11/27/2018   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 - [place], [place] - [address] [place] [address] [place] [address] Phone: [phone] Fax: [phone]     Social Drivers of Health (SDOH) Social History: SDOH Screenings   Food Insecurity: No Food Insecurity (08/27/2023)  Housing: Low Risk  (08/27/2023)  Transportation Needs: No Transportation Needs (08/27/2023)  Utilities: Not At Risk (08/27/2023)  Alcohol  Screen: Low Risk  (10/31/2022)  Depression (PHQ2-9): Low Risk  (10/31/2022)  Social Connections: Moderately Integrated (08/27/2023)  Tobacco Use: Medium Risk (08/27/2023)   SDOH Interventions:     Readmission Risk Interventions     No data to display

## 2023-08-29 NOTE — NC FL2 (Cosign Needed Addendum)
 [place]  MEDICAID FL2 LEVEL OF CARE FORM     IDENTIFICATION  Patient Name: Andrew Larsen Birthdate: Mar 23, 1941 Sex: male Admission Date (Current Location): 08/27/2023  [facility] and [place] Number:  Producer, television/film/video and Address:  [facility] [place], 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) 08/27/2023   ILD (interstitial lung disease) (HCC) 07/26/2023   PAD (peripheral artery disease) (HCC) 07/23/2023   OSA (obstructive sleep apnea) 07/23/2023   BPH (benign prostatic hyperplasia) 07/23/2023   Trimalleolar fracture of ankle, closed, left, initial encounter 07/20/2023   Malignant neoplasm of prostate (HCC) 11/04/2022   Complicated UTI (urinary tract infection) 09/06/2021   Cough 01/07/2019   Labile blood glucose    SOB (shortness of breath)    Urinary frequency    Hypoalbuminemia due to protein-calorie malnutrition (HCC)    Stage 3a chronic kidney disease (CKD) (HCC)    Physical debility 12/01/2018   Pressure injury of skin 11/27/2018   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   DMII (diabetes mellitus, type 2) (HCC) 11/03/2018   Essential hypertension 11/03/2018   Claudication in peripheral vascular disease (HCC) 06/10/2016   Morbid obesity (HCC) 09/21/2015   Upper airway cough syndrome 09/20/2015   Cough variant asthma 09/20/2015    Orientation RESPIRATION BLADDER Height & Weight     Self, Time, Situation, Place   Normal Continent Weight: 262 lb (118.8 kg) Height:  5\' 11"  (180.3 cm)  BEHAVIORAL SYMPTOMS/MOOD NEUROLOGICAL BOWEL NUTRITION STATUS      Continent Diet (see discharge summary)  AMBULATORY STATUS COMMUNICATION OF NEEDS Skin   Limited Assist Verbally Surgical wounds                       Personal Care Assistance Level of Assistance  Bathing, Feeding, Dressing Bathing Assistance: Limited assistance Feeding assistance: Independent Dressing Assistance: Limited assistance     Functional Limitations Info  Sight, Hearing, Speech Sight Info: Adequate Hearing Info: Adequate Speech Info: Adequate    SPECIAL CARE FACTORS FREQUENCY  PT (By licensed PT), OT (By licensed OT)     PT Frequency: 5x week OT Frequency: 5x week            Contractures Contractures Info: Not present    Additional Factors Info  Code Status, Allergies, Insulin  Sliding Scale Code Status Info: full Allergies Info: Metformin And Related, Pollen Extract, Simbrinza (Brinzolamide-brimonidine )   Insulin  Sliding Scale Info: Novolog : see DC summary       Current Medications (08/29/2023):  This is the current hospital active medication list Current Facility-Administered Medications  Medication Dose Route Frequency Provider Last Rate Last Admin   acetaminophen  (TYLENOL ) tablet 1,000 mg  1,000 mg Oral Q6H PRN Gonfa, Taye T, MD       bisacodyl  (DULCOLAX) suppository 10 mg  10 mg Rectal Daily PRN Gawne, Meghan M, PA-C       ceFEPIme  (MAXIPIME ) 2 g in sodium chloride  0.9 % 100 mL IVPB  2 g Intravenous Q8H Calone, Almarie Kurdziel D, FNP 200 mL/hr at 08/29/23 1054 2 g at 08/29/23 1054   clopidogrel  (PLAVIX ) tablet 75 mg  75 mg Oral Daily Gawne, Meghan M, PA-C   75 mg at 08/29/23 1046   diphenhydrAMINE  (BENADRYL ) 12.5 MG/5ML elixir 12.5-25 mg  12.5-25 mg Oral Q4H PRN Gawne, Meghan M, PA-C       dorzolamide  (TRUSOPT ) 2 % ophthalmic solution 1 drop  1 drop Right Eye BID Gawne, Meghan M, PA-C   1 drop at 08/29/23 4098   enoxaparin   (LOVENOX ) injection 60 mg  60 mg Subcutaneous Q24H Gonfa, Taye T, MD   60 mg at 08/29/23 1191   ezetimibe  (ZETIA ) tablet 10 mg  10 mg Oral QPC supper Gawne, Meghan M, PA-C   10 mg at 08/28/23 1737   furosemide  (LASIX ) tablet 40 mg  40 mg Oral Daily Gawne, Meghan M, PA-C   40 mg at 08/29/23 1046   gabapentin  (NEURONTIN ) capsule 600 mg  600 mg Oral TID Gawne, Meghan M, PA-C   600 mg at 08/29/23 1046   HYDROcodone -acetaminophen  (NORCO/VICODIN) 5-325 MG per tablet 1-2 tablet  1-2 tablet Oral Q4H PRN Gawne, Meghan M, PA-C       insulin  aspart (novoLOG ) injection 0-15 Units  0-15 Units Subcutaneous TID WC Jinwala, Sagar H, MD   3 Units at 08/28/23 1737   insulin  aspart protamine- aspart (NOVOLOG  MIX 70/30) injection 25 Units  25 Units Subcutaneous BID WC Jinwala, Sagar H, MD   25 Units at 08/29/23 4782   latanoprost  (XALATAN ) 0.005 % ophthalmic solution 1 drop  1 drop Both Eyes QHS Gawne, Meghan M, PA-C   1 drop at 08/28/23 2109   losartan  (COZAAR ) tablet 25 mg  25 mg Oral Daily Gawne, Meghan M, PA-C   25 mg at 08/29/23 1046   magnesium  citrate solution 1 Bottle  1 Bottle Oral Once PRN Gawne, Meghan M, PA-C       metoCLOPramide  (REGLAN ) tablet 5-10 mg  5-10 mg Oral Q8H PRN Gawne, Meghan M, PA-C       Or   metoCLOPramide  (REGLAN ) injection 5-10 mg  5-10 mg Intravenous Q8H PRN Gawne, Meghan M, PA-C       metoprolol  tartrate (LOPRESSOR ) tablet 25 mg  25 mg Oral BID Gawne, Meghan M, PA-C   25 mg at 08/29/23 1046   morphine  (PF) 2 MG/ML injection 0.5-1 mg  0.5-1 mg Intravenous Q2H PRN Gawne, Meghan M, PA-C   1 mg at 08/28/23 1738   multivitamin with minerals tablet 1 tablet  1 tablet Oral Q breakfast Gawne, Meghan M, PA-C   1 tablet at 08/29/23 9562   ondansetron  (ZOFRAN ) tablet 4 mg  4 mg Oral Q6H PRN Gawne, Meghan M, PA-C       Or   ondansetron  (ZOFRAN ) injection 4 mg  4 mg Intravenous Q6H PRN Gawne, Meghan M, PA-C       oxyCODONE  (Oxy IR/ROXICODONE ) immediate release tablet 5 mg  5 mg Oral Q4H PRN Gawne,  Meghan M, PA-C   5 mg at 08/28/23 2226   polyethylene glycol (MIRALAX  / GLYCOLAX ) packet 17 g  17 g Oral BID PRN Gonfa, Taye T, MD       [START ON 09/01/2023] rosuvastatin  (CRESTOR ) tablet 10 mg  10 mg Oral Q Sun Gawne, Meghan M, PA-C       senna-docusate (Senokot-S) tablet 1 tablet  1 tablet Oral BID Gonfa, Taye T, MD       tamsulosin  (FLOMAX ) capsule  0.4 mg  0.4 mg Oral Daily Gawne, Meghan M, PA-C   0.4 mg at 08/29/23 1046   timolol  (TIMOPTIC ) 0.5 % ophthalmic solution 1 drop  1 drop Right Eye BID Gawne, Meghan M, PA-C   1 drop at 08/29/23 9562     Discharge Medications: Please see discharge summary for a list of discharge medications.  Relevant Imaging Results:  Relevant Lab Results:   Additional Information SS#  [id number]. Will need IV abx at discharge.  Elspeth Hals, LCSW

## 2023-08-29 NOTE — Progress Notes (Signed)
 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  Reviewed plan of care for 6 weeks of cefepime  through PICC line which can be placed starting tomorrow, 5/2 to ensure adequate antibiotic coverage.  Home health/OPAT orders placed.  Tolerating antibiotics with no adverse side effects.  Continue universal/standard precautions.  Will arrange follow-up in the ID clinic.  Okay for discharge from ID standpoint once PICC line placed and home health organized.  Continue postoperative wound care and remaining medical and supportive care per orthopedics.  PLAN:  Continue current dose of cefepime  for 6 weeks with end of treatment 10/09/2023. Postoperative wound care per orthopedics. Home health/OPAT orders. PICC line tomorrow, 5/2. Universal/standard precautions Follow-up ID clinic. Okay for discharge once PICC line placed and home health arranged. Remaining medical and supportive care per primary team.  Diagnosis:  Enterobacter cloacae left ankle abscess +/- osteomyelitis status post debridement with bacteremia.   Allergies  Allergen Reactions   Metformin And Related Shortness Of Breath   Pollen Extract Itching and Other (See Comments)    Some wheezing, itchy eyes, runny nose   Simbrinza [Brinzolamide-Brimonidine ] Other (See Comments)    Drainage, redness to eyes    OPAT Orders Discharge antibiotics to be given via PICC line Discharge antibiotics: Per pharmacy protocol   Duration: 6 weeks End Date: 10/09/2023  [facility] Care Per Protocol:  Home health RN for IV administration and teaching;  PICC line care and labs.    Labs weekly while on IV antibiotics: _X_ CBC with differential _X_ BMP __ CMP _X_ CRP _X_ ESR __ Vancomycin  trough __ CK  __ Please pull PIC at completion of IV antibiotics _X_ Please leave PIC in place until doctor has seen patient or been notified  Fax weekly labs to [phone]  Clinic Follow Up Appt:  09/26/2023 at 3 PM with Dr. 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) Active Problems:   DMII (diabetes mellitus, type 2) (HCC)   Essential hypertension   Inclusion body myositis   Hypoalbuminemia due to protein-calorie malnutrition (HCC)   Stage 3a chronic kidney disease (CKD) (HCC)   Malignant neoplasm of prostate (HCC)   OSA (obstructive sleep apnea)   BPH (benign prostatic hyperplasia)   ILD (interstitial lung disease) (HCC)   Bacteremia due to Enterobacter species    clopidogrel   75 mg Oral Daily   dorzolamide   1 drop Right Eye BID   enoxaparin  (LOVENOX ) injection  60 mg Subcutaneous Q24H   ezetimibe   10 mg Oral QPC supper   furosemide   40 mg Oral Daily   gabapentin   600 mg Oral TID   insulin  aspart  0-15 Units Subcutaneous TID WC   insulin  aspart protamine- aspart  25 Units Subcutaneous BID WC   latanoprost   1 drop Both Eyes QHS   losartan   25 mg Oral Daily   metoprolol  tartrate  25 mg Oral BID   multivitamin with minerals  1 tablet Oral Q breakfast   [START ON 09/01/2023] rosuvastatin   10 mg Oral Q Sun  senna-docusate  1 tablet Oral BID   tamsulosin   0.4 mg Oral Daily   timolol   1 drop Right Eye BID    SUBJECTIVE:  Afebrile overnight with no acute events.  Daughter visiting at bedside.  Tolerating antibiotics with no adverse side effects.  No fevers, chills, or sweats.  Allergies  Allergen Reactions   Metformin And Related Shortness Of Breath   Pollen Extract Itching and Other (See Comments)    Some wheezing, itchy eyes, runny nose   Simbrinza  [Brinzolamide-Brimonidine ] Other (See Comments)    Drainage, redness to eyes     Review of Systems: Review of Systems  Constitutional:  Negative for chills, fever and weight loss.  Respiratory:  Negative for cough, shortness of breath and wheezing.   Cardiovascular:  Negative for chest pain and leg swelling.  Gastrointestinal:  Negative for abdominal pain, constipation, diarrhea, nausea and vomiting.  Skin:  Negative for rash.      OBJECTIVE: Vitals:   08/28/23 1941 08/28/23 2227 08/29/23 0435 08/29/23 0759  BP: (!) 125/52 136/70 119/68 (!) 150/60  Pulse: 75 77 75 78  Resp: 17  18 18   Temp: 98.5 F (36.9 C)  98.2 F (36.8 C) 98 F (36.7 C)  TempSrc:   Oral Oral  SpO2: 100%  98% 100%  Weight:      Height:       Body mass index is 36.54 kg/m.  Physical Exam Constitutional:      General: He is not in acute distress.    Appearance: He is well-developed.     Comments: Seated at the side of the bed; pleasant  Cardiovascular:     Rate and Rhythm: Normal rate and regular rhythm.     Heart sounds: Normal heart sounds.  Pulmonary:     Effort: Pulmonary effort is normal.     Breath sounds: Normal breath sounds.  Musculoskeletal:     Comments: Wound VAC in place.  Skin:    General: Skin is warm and dry.  Neurological:     Mental Status: He is alert and oriented to person, place, and time.     Lab Results Lab Results  Component Value Date   WBC 6.1 08/29/2023   HGB 13.2 08/29/2023   HCT 40.2 08/29/2023   MCV 92.8 08/29/2023   PLT 203 08/29/2023    Lab Results  Component Value Date   CREATININE 1.30 (H) 08/29/2023   BUN 26 (H) 08/29/2023   NA 135 08/29/2023   K 4.4 08/29/2023   CL 100 08/29/2023   CO2 23 08/29/2023    Lab Results  Component Value Date   ALT 24 08/29/2023   AST 27 08/29/2023   ALKPHOS 94 08/29/2023   BILITOT 0.7 08/29/2023     Microbiology: Recent Results (from the past 240 hours)  Aerobic/Anaerobic Culture w Gram Stain  (surgical/deep wound)     Status: None (Preliminary result)   Collection Time: 08/27/23 11:04 AM   Specimen: Wound  Result Value Ref Range Status   Specimen Description WOUND  Final   Special Requests NONE  Final   Gram Stain   Final    NO WBC SEEN NO ORGANISMS SEEN Performed at [facility], [address], [place], [place] [address]    Culture MODERATE ENTEROBACTER CLOACAE  Final   Report Status PENDING  Incomplete   Organism ID, Bacteria ENTEROBACTER CLOACAE  Final      Susceptibility   Enterobacter cloacae - MIC*    CEFEPIME  <=0.12  SENSITIVE Sensitive     CEFTAZIDIME <=1 SENSITIVE Sensitive     CIPROFLOXACIN <=0.25 SENSITIVE Sensitive     GENTAMICIN <=1 SENSITIVE Sensitive     IMIPENEM <=0.25 SENSITIVE Sensitive     TRIMETH/SULFA <=20 SENSITIVE Sensitive     PIP/TAZO <=4 SENSITIVE Sensitive ug/mL    * MODERATE ENTEROBACTER CLOACAE  Culture, blood (Routine X 2) w Reflex to ID Panel     Status: None (Preliminary result)   Collection Time: 08/27/23  2:04 PM   Specimen: BLOOD  Result Value Ref Range Status   Specimen Description BLOOD SITE NOT SPECIFIED  Final   Special Requests   Final    BOTTLES DRAWN AEROBIC AND ANAEROBIC Blood Culture adequate volume   Culture   Final    NO GROWTH 2 DAYS Performed at [facility], [address], [place], [place] [address]    Report Status PENDING  Incomplete  Culture, blood (Routine X 2) w Reflex to ID Panel     Status: Abnormal (Preliminary result)   Collection Time: 08/27/23  2:11 PM   Specimen: BLOOD  Result Value Ref Range Status   Specimen Description BLOOD SITE NOT SPECIFIED  Final   Special Requests   Final    BOTTLES DRAWN AEROBIC AND ANAEROBIC Blood Culture adequate volume   Culture  Setup Time   Final    GRAM NEGATIVE RODS AEROBIC BOTTLE ONLY CRITICAL RESULT CALLED TO, READ BACK BY AND VERIFIED WITH: J LEDFORD,PHARMD@[email or web address]  08/28/23 MK    Culture (A)  Final    ENTEROBACTER CLOACAE SUSCEPTIBILITIES TO  FOLLOW Performed at [facility], [address], [place], [place] [address]    Report Status PENDING  Incomplete  Blood Culture ID Panel (Reflexed)     Status: Abnormal   Collection Time: 08/27/23  2:11 PM  Result Value Ref Range Status   Enterococcus faecalis NOT DETECTED NOT DETECTED Final   Enterococcus Faecium NOT DETECTED NOT DETECTED Final   Listeria monocytogenes NOT DETECTED NOT DETECTED Final   Staphylococcus species NOT DETECTED NOT DETECTED Final   Staphylococcus aureus (BCID) NOT DETECTED NOT DETECTED Final   Staphylococcus epidermidis NOT DETECTED NOT DETECTED Final   Staphylococcus lugdunensis NOT DETECTED NOT DETECTED Final   Streptococcus species NOT DETECTED NOT DETECTED Final   Streptococcus agalactiae NOT DETECTED NOT DETECTED Final   Streptococcus pneumoniae NOT DETECTED NOT DETECTED Final   Streptococcus pyogenes NOT DETECTED NOT DETECTED Final   A.calcoaceticus-baumannii NOT DETECTED NOT DETECTED Final   Bacteroides fragilis NOT DETECTED NOT DETECTED Final   Enterobacterales DETECTED (A) NOT DETECTED Final    Comment: Enterobacterales represent a large order of gram negative bacteria, not a single organism. CRITICAL RESULT CALLED TO, READ BACK BY AND VERIFIED WITH: J LEDFORD,PHARMD@[email or web address]  08/28/23 MK    Enterobacter cloacae complex DETECTED (A) NOT DETECTED Final    Comment: CRITICAL RESULT CALLED TO, READ BACK BY AND VERIFIED WITH: J LEDFORD,PHARMD@[email or web address]  08/28/23 MK    Escherichia coli NOT DETECTED NOT DETECTED Final   Klebsiella aerogenes NOT DETECTED NOT DETECTED Final   Klebsiella oxytoca NOT DETECTED NOT DETECTED Final   Klebsiella pneumoniae NOT DETECTED NOT DETECTED Final   Proteus species NOT DETECTED NOT DETECTED Final   Salmonella species NOT DETECTED NOT DETECTED Final   Serratia marcescens NOT DETECTED NOT DETECTED Final   Haemophilus influenzae NOT DETECTED NOT DETECTED Final   Neisseria meningitidis NOT DETECTED NOT DETECTED Final    Pseudomonas aeruginosa NOT DETECTED NOT DETECTED Final   Stenotrophomonas maltophilia  NOT DETECTED NOT DETECTED Final   Candida albicans NOT DETECTED NOT DETECTED Final   Candida auris NOT DETECTED NOT DETECTED Final   Candida glabrata NOT DETECTED NOT DETECTED Final   Candida krusei NOT DETECTED NOT DETECTED Final   Candida parapsilosis NOT DETECTED NOT DETECTED Final   Candida tropicalis NOT DETECTED NOT DETECTED Final   Cryptococcus neoformans/gattii NOT DETECTED NOT DETECTED Final   CTX-M ESBL NOT DETECTED NOT DETECTED Final   Carbapenem resistance IMP NOT DETECTED NOT DETECTED Final   Carbapenem resistance KPC NOT DETECTED NOT DETECTED Final   Carbapenem resistance NDM NOT DETECTED NOT DETECTED Final   Carbapenem resist OXA 48 LIKE NOT DETECTED NOT DETECTED Final   Carbapenem resistance VIM NOT DETECTED NOT DETECTED Final    Comment: Performed at [facility], [address] [place], [place] [address]    I have personally spent 25 minutes involved in face-to-face and non-face-to-face activities for this patient on the day of the visit. Professional time spent includes the following activities: Preparing to see the patient (review of tests), Obtaining and/or reviewing separately obtained history (admission/discharge record), Performing a medically appropriate examination and/or evaluation , Ordering medications/tests/procedures, referring and communicating with other health care professionals, Documenting clinical information in the EMR, Independently interpreting results (not separately reported), Communicating results to the patient/family/caregiver, Counseling and educating the patient/family/caregiver and Care coordination (not separately reported).    Greg Lynnsie Linders, NP [facility]  08/29/2023  1:08 PM

## 2023-08-29 NOTE — Progress Notes (Signed)
 PROGRESS NOTE  Andrew Larsen:[id number] DOB: 1940-07-31   PCP: Barnetta Liberty, MD  Patient is from: Home.  Lives with family.  DOA: 08/27/2023 LOS: 2  Chief complaints Infection of left ankle prosthesis    Brief Narrative / Interim history: -year-old M with PMH of left ankle fracture s/p repair on 3/23, HTN, T2DM, CKD 3A, ILD, inclusion body myositis, obesity, OSA on CPAP, PAD s/p angioplasty of left SFA 2018, prostate cancer s/p radiation, presenting with left ankle prosthesis infection.  Patient went to see his PCP due to concern for delayed surgical wound healing and some yellow/clear drainage.  He was sent to ED due to concern for infection.  In ED, vital stable without fever.  No leukocytosis.  He was taken to the OR for I&D, hardware removal and VAC placement.  Cultures obtained.  Started on broad-spectrum antibiotics.  ID consulted.  Hospitalist service consulted for help with management of chronic comorbidities.  The next day, blood culture with Enterobacter cloacae in 1 out of 2 bottles.  Antibiotics de-escalated to IV cefepime .  ID recommends PICC line on 5/2 for outpatient IV antibiotics.    Subjective: Seen and examined earlier this morning.  No major events overnight of this morning.  No complaints other than some pain at surgical site.  Having bowel movements.  Objective: Vitals:   08/28/23 1941 08/28/23 2227 08/29/23 0435 08/29/23 0759  BP: (!) 125/52 136/70 119/68 (!) 150/60  Pulse: 75 77 75 78  Resp: 17  18 18   Temp: 98.5 F (36.9 C)  98.2 F (36.8 C) 98 F (36.7 C)  TempSrc:   Oral Oral  SpO2: 100%  98% 100%  Weight:      Height:        Examination:  GENERAL: No apparent distress.  Nontoxic. HEENT: MMM.  Vision and hearing grossly intact.  NECK: Supple.  No apparent JVD.  RESP:  No IWOB.  Fair aeration bilaterally. CVS:  RRR. Heart sounds normal.  ABD/GI/GU: BS+. Abd soft, NTND.  MSK/EXT:  Moves extremities.  Postop boot and wound VAC in  place. SKIN: no apparent skin lesion or wound NEURO: Awake, alert and oriented appropriately.  No apparent focal neuro deficit. PSYCH: Calm. Normal affect.   Consultants:  Orthopedic surgery Infectious disease  Procedures: 4/29-I&D of left ankle abscess with removal of hardware and application of wound VAC  Microbiology summarized: 4/29-blood culture with Enterobacter cloacae in 1 out of 2 bottles 4/29-surgical tissue culture with moderate GNR.  Assessment and plan: Left ankle abscess with hardware infection: had recent ankle surgery on 3/23 for ankle fracture after mechanical fall.  Presented with slow healing surgical wound and drainage.  No fever or leukocytosis.  CRP 4.8.  ESR 42. -S/p I&D with removal of hardware and application of wound VAC by Dr. [name] on 4/29 -Surgical culture with moderate GNR.  Blood culture with Enterobacter cloacae in 1 out of 2 bottles. -Antibiotic escalated to IV cefepime .  Plan for PICC line on 5/2 per ID. -Follow-up culture sensitivity -Pain control and VTE prophylaxis per surgery-I have taken the liberty to schedule Tylenol  -Wound care and therapy per primary -Bowel regimen  Enterobacter cloacae bacteremia: Blood culture as above -Antibiotics as above  IDDM-2 with hyperglycemia: A1c 7.3 on 3/23.  On Novolin 70/30 at 50u BID and Mounjaro at home Recent Labs  Lab 08/28/23 0629 08/28/23 1106 08/28/23 1641 08/28/23 2137 08/29/23 0605  GLUCAP 151* 82 168* 175* 103*  - Continue Novolin 70/30 at 25 units twice  daily - Continue moderate sliding scale - Further adjustment as appropriate  Essential hypertension: Normotensive for most part. -Continue home metoprolol  and losartan  -Pain control   PAD s/p angioplasty of left SFA 2018 -Continue home Plavix , Crestor , Zetia  and aspirin    Chronic LE edema: TTE on 3/24 without significant finding. -Continue home p.o. Lasix  -Monitor fluid and respiratory status   CKD 3A: Baseline Cr ~1.2-1.4.   Stable -Monitor   OSA - Ensure he wears CPAP at night   Inclusion body myositis: Noted on workup at [facility], has positive NT5C1A antibody -PT/OT eval   BPH - Continue home Flomax    Hx of stage 1 prostate cancer s/p radiation therapy -follows urology for PSA monitoring posttreatment  Constipation: Resolved. -Continue bowel regimen.     TRH will continue to follow the patient.  Class II obesity Body mass index is 36.54 kg/m. -Already on [facility] outpatient. -Encourage lifestyle change to lose weight   DVT prophylaxis:  SCDs Start: 08/27/23 1258  Code Status: Full code Family Communication: None at bedside today. Level of care: Med-Surg Status is: Inpatient   Final disposition: Per primary   55 minutes with more than 50% spent in reviewing records, counseling patient/family and coordinating care.   Sch Meds:  Scheduled Meds:  clopidogrel   75 mg Oral Daily   dorzolamide   1 drop Right Eye BID   enoxaparin  (LOVENOX ) injection  60 mg Subcutaneous Q24H   ezetimibe   10 mg Oral QPC supper   furosemide   40 mg Oral Daily   gabapentin   600 mg Oral TID   insulin  aspart  0-15 Units Subcutaneous TID WC   insulin  aspart protamine- aspart  25 Units Subcutaneous BID WC   latanoprost   1 drop Both Eyes QHS   losartan   25 mg Oral Daily   metoprolol  tartrate  25 mg Oral BID   multivitamin with minerals  1 tablet Oral Q breakfast   [START ON 09/01/2023] rosuvastatin   10 mg Oral Q Sun   senna-docusate  1 tablet Oral BID   tamsulosin   0.4 mg Oral Daily   timolol   1 drop Right Eye BID   Continuous Infusions:  ceFEPime  (MAXIPIME ) IV 2 g (08/29/23 1054)   PRN Meds:.acetaminophen , bisacodyl , diphenhydrAMINE , HYDROcodone -acetaminophen , magnesium  citrate, metoCLOPramide  **OR** metoCLOPramide  (REGLAN ) injection, morphine  injection, ondansetron  **OR** ondansetron  (ZOFRAN ) IV, oxyCODONE , polyethylene glycol  Antimicrobials: Anti-infectives (From admission, onward)    Start     Dose/Rate  Route Frequency Ordered Stop   08/28/23 1000  ceFEPIme  (MAXIPIME ) 2 g in sodium chloride  0.9 % 100 mL IVPB        2 g 200 mL/hr over 30 Minutes Intravenous Every 8 hours 08/28/23 0826     08/27/23 1730  DAPTOmycin  (CUBICIN ) IVPB 700 mg/100mL premix  Status:  Discontinued        8 mg/kg  92.7 kg (Adjusted) 200 mL/hr over 30 Minutes Intravenous Daily 08/27/23 1642 08/28/23 1019   08/27/23 1715  cefTRIAXone  (ROCEPHIN ) 2 g in sodium chloride  0.9 % 100 mL IVPB  Status:  Discontinued        2 g 200 mL/hr over 30 Minutes Intravenous Every 24 hours 08/27/23 1622 08/28/23 0826   08/27/23 1115  vancomycin  (VANCOCIN ) powder  Status:  Discontinued          As needed 08/27/23 1121 08/27/23 1147        I have personally reviewed the following labs and images: CBC: Recent Labs  Lab 08/27/23 1404 08/28/23 0614 08/29/23 0542  WBC 5.3 9.2 6.1  HGB 13.5 13.0 13.2  HCT 40.3 38.1* 40.2  MCV 92.6 91.4 92.8  PLT 184 217 203   BMP &GFR Recent Labs  Lab 08/27/23 1404 08/28/23 0614 08/29/23 0542  NA  --  133* 135  K  --  4.5 4.4  CL  --  99 100  CO2  --  24 23  GLUCOSE  --  151* 111*  BUN  --  23 26*  CREATININE 1.21 1.24 1.30*  CALCIUM   --  9.0 9.0  MG  --   --  2.2   Estimated Creatinine Clearance: 59.4 mL/min (A) (by C-G formula based on SCr of 1.3 mg/dL (H)). Liver & Pancreas: Recent Labs  Lab 08/28/23 0614 08/29/23 0542  AST 28 27  ALT 29 24  ALKPHOS 101 94  BILITOT 0.7 0.7  PROT 6.9 7.0  ALBUMIN 3.1* 3.3*   No results for input(s): "LIPASE", "AMYLASE" in the last 168 hours. No results for input(s): "AMMONIA" in the last 168 hours. Diabetic: No results for input(s): "HGBA1C" in the last 72 hours. Recent Labs  Lab 08/28/23 0629 08/28/23 1106 08/28/23 1641 08/28/23 2137 08/29/23 0605  GLUCAP 151* 82 168* 175* 103*   Cardiac Enzymes: Recent Labs  Lab 08/28/23 0614  CKTOTAL 288   No results for input(s): "PROBNP" in the last 8760 hours. Coagulation Profile: No  results for input(s): "INR", "PROTIME" in the last 168 hours. Thyroid  Function Tests: No results for input(s): "TSH", "T4TOTAL", "FREET4", "T3FREE", "THYROIDAB" in the last 72 hours. Lipid Profile: No results for input(s): "CHOL", "HDL", "LDLCALC", "TRIG", "CHOLHDL", "LDLDIRECT" in the last 72 hours. Anemia Panel: No results for input(s): "VITAMINB12", "FOLATE", "FERRITIN", "TIBC", "IRON", "RETICCTPCT" in the last 72 hours. Urine analysis:    Component Value Date/Time   COLORURINE YELLOW 09/01/2021 1758   APPEARANCEUR CLEAR 09/01/2021 1758   LABSPEC 1.011 09/01/2021 1758   PHURINE 6.0 09/01/2021 1758   GLUCOSEU NEGATIVE 09/01/2021 1758   HGBUR SMALL (A) 09/01/2021 1758   BILIRUBINUR NEGATIVE 09/01/2021 1758   KETONESUR NEGATIVE 09/01/2021 1758   PROTEINUR NEGATIVE 09/01/2021 1758   NITRITE NEGATIVE 09/01/2021 1758   LEUKOCYTESUR SMALL (A) 09/01/2021 1758   Sepsis Labs: Invalid input(s): "PROCALCITONIN", "LACTICIDVEN"  Microbiology: Recent Results (from the past 240 hours)  Aerobic/Anaerobic Culture w Gram Stain (surgical/deep wound)     Status: None (Preliminary result)   Collection Time: 08/27/23 11:04 AM   Specimen: Wound  Result Value Ref Range Status   Specimen Description WOUND  Final   Special Requests NONE  Final   Gram Stain   Final    NO WBC SEEN NO ORGANISMS SEEN Performed at [facility], [address] [place], [place] [address]    Culture MODERATE ENTEROBACTER CLOACAE  Final   Report Status PENDING  Incomplete   Organism ID, Bacteria ENTEROBACTER CLOACAE  Final      Susceptibility   Enterobacter cloacae - MIC*    CEFEPIME  <=0.12 SENSITIVE Sensitive     CEFTAZIDIME <=1 SENSITIVE Sensitive     CIPROFLOXACIN <=0.25 SENSITIVE Sensitive     GENTAMICIN <=1 SENSITIVE Sensitive     IMIPENEM <=0.25 SENSITIVE Sensitive     TRIMETH/SULFA <=20 SENSITIVE Sensitive     PIP/TAZO <=4 SENSITIVE Sensitive ug/mL    * MODERATE ENTEROBACTER CLOACAE  Culture, blood  (Routine X 2) w Reflex to ID Panel     Status: None (Preliminary result)   Collection Time: 08/27/23  2:04 PM   Specimen: BLOOD  Result Value Ref Range  Status   Specimen Description BLOOD SITE NOT SPECIFIED  Final   Special Requests   Final    BOTTLES DRAWN AEROBIC AND ANAEROBIC Blood Culture adequate volume   Culture   Final    NO GROWTH 2 DAYS Performed at [facility], [address], [place], [place] [address]    Report Status PENDING  Incomplete  Culture, blood (Routine X 2) w Reflex to ID Panel     Status: Abnormal (Preliminary result)   Collection Time: 08/27/23  2:11 PM   Specimen: BLOOD  Result Value Ref Range Status   Specimen Description BLOOD SITE NOT SPECIFIED  Final   Special Requests   Final    BOTTLES DRAWN AEROBIC AND ANAEROBIC Blood Culture adequate volume   Culture  Setup Time   Final    GRAM NEGATIVE RODS AEROBIC BOTTLE ONLY CRITICAL RESULT CALLED TO, READ BACK BY AND VERIFIED WITH: J LEDFORD,PHARMD@[id number]  08/28/23 MK    Culture (A)  Final    ENTEROBACTER CLOACAE SUSCEPTIBILITIES TO FOLLOW Performed at [facility], [address], [place], [place] [address]    Report Status PENDING  Incomplete  Blood Culture ID Panel (Reflexed)     Status: Abnormal   Collection Time: 08/27/23  2:11 PM  Result Value Ref Range Status   Enterococcus faecalis NOT DETECTED NOT DETECTED Final   Enterococcus Faecium NOT DETECTED NOT DETECTED Final   Listeria monocytogenes NOT DETECTED NOT DETECTED Final   Staphylococcus species NOT DETECTED NOT DETECTED Final   Staphylococcus aureus (BCID) NOT DETECTED NOT DETECTED Final   Staphylococcus epidermidis NOT DETECTED NOT DETECTED Final   Staphylococcus lugdunensis NOT DETECTED NOT DETECTED Final   Streptococcus species NOT DETECTED NOT DETECTED Final   Streptococcus agalactiae NOT DETECTED NOT DETECTED Final   Streptococcus pneumoniae NOT DETECTED NOT DETECTED Final   Streptococcus pyogenes NOT DETECTED NOT DETECTED Final    A.calcoaceticus-baumannii NOT DETECTED NOT DETECTED Final   Bacteroides fragilis NOT DETECTED NOT DETECTED Final   Enterobacterales DETECTED (A) NOT DETECTED Final    Comment: Enterobacterales represent a large order of gram negative bacteria, not a single organism. CRITICAL RESULT CALLED TO, READ BACK BY AND VERIFIED WITH: J LEDFORD,PHARMD@[email or web address]  08/28/23 MK    Enterobacter cloacae complex DETECTED (A) NOT DETECTED Final    Comment: CRITICAL RESULT CALLED TO, READ BACK BY AND VERIFIED WITH: J LEDFORD,PHARMD@[email or web address]  08/28/23 MK    Escherichia coli NOT DETECTED NOT DETECTED Final   Klebsiella aerogenes NOT DETECTED NOT DETECTED Final   Klebsiella oxytoca NOT DETECTED NOT DETECTED Final   Klebsiella pneumoniae NOT DETECTED NOT DETECTED Final   Proteus species NOT DETECTED NOT DETECTED Final   Salmonella species NOT DETECTED NOT DETECTED Final   Serratia marcescens NOT DETECTED NOT DETECTED Final   Haemophilus influenzae NOT DETECTED NOT DETECTED Final   Neisseria meningitidis NOT DETECTED NOT DETECTED Final   Pseudomonas aeruginosa NOT DETECTED NOT DETECTED Final   Stenotrophomonas maltophilia NOT DETECTED NOT DETECTED Final   Candida albicans NOT DETECTED NOT DETECTED Final   Candida auris NOT DETECTED NOT DETECTED Final   Candida glabrata NOT DETECTED NOT DETECTED Final   Candida krusei NOT DETECTED NOT DETECTED Final   Candida parapsilosis NOT DETECTED NOT DETECTED Final   Candida tropicalis NOT DETECTED NOT DETECTED Final   Cryptococcus neoformans/gattii NOT DETECTED NOT DETECTED Final   CTX-M ESBL NOT DETECTED NOT DETECTED Final   Carbapenem resistance IMP NOT DETECTED NOT DETECTED Final   Carbapenem resistance KPC NOT DETECTED NOT  DETECTED Final   Carbapenem resistance NDM NOT DETECTED NOT DETECTED Final   Carbapenem resist OXA 48 LIKE NOT DETECTED NOT DETECTED Final   Carbapenem resistance VIM NOT DETECTED NOT DETECTED Final    Comment: Performed at [facility], [address] [place], [place] [address]    Radiology Studies: No results found.    Jaisen Wiltrout T. Kailah Pennel [facility]  If 7PM-7AM, please contact night-coverage [email or web address] 08/29/2023, 11:25 AM

## 2023-08-30 DIAGNOSIS — Z96662 Presence of left artificial ankle joint: Secondary | ICD-10-CM | POA: Diagnosis not present

## 2023-08-30 DIAGNOSIS — R7881 Bacteremia: Secondary | ICD-10-CM | POA: Diagnosis not present

## 2023-08-30 DIAGNOSIS — T8459XA Infection and inflammatory reaction due to other internal joint prosthesis, initial encounter: Secondary | ICD-10-CM | POA: Diagnosis not present

## 2023-08-30 LAB — GLUCOSE, CAPILLARY
Glucose-Capillary: 151 mg/dL — ABNORMAL HIGH (ref 70–99)
Glucose-Capillary: 132 mg/dL — ABNORMAL HIGH (ref 70–99)
Glucose-Capillary: 182 mg/dL — ABNORMAL HIGH (ref 70–99)

## 2023-08-30 LAB — CULTURE, BLOOD (ROUTINE X 2): Special Requests: ADEQUATE

## 2023-08-30 MED ORDER — CEFEPIME IV (FOR PTA / DISCHARGE USE ONLY): 2.0000 g | Freq: Two times a day (BID) | INTRAVENOUS | 0 refills | Status: AC

## 2023-08-30 MED ORDER — SODIUM CHLORIDE 0.9% FLUSH: 10.0000 mL | INTRAVENOUS | Status: DC | PRN

## 2023-08-30 MED ORDER — SODIUM CHLORIDE 0.9% FLUSH
10.0000 mL | Freq: Two times a day (BID) | INTRAVENOUS | Status: DC
  Administered 2023-08-30: 10 mL

## 2023-08-30 MED ORDER — ASPIRIN 81 MG PO TBEC: 81.0000 mg | DELAYED_RELEASE_TABLET | Freq: Every day | ORAL | Status: AC

## 2023-08-30 MED ORDER — OXYCODONE HCL 5 MG PO TABS: 5.0000 mg | ORAL_TABLET | Freq: Three times a day (TID) | ORAL | 0 refills | Status: DC | PRN

## 2023-08-30 MED ORDER — MAGNESIUM CITRATE PO SOLN: 1.0000 | Freq: Once | ORAL | Status: DC | PRN

## 2023-08-30 MED ORDER — CHLORHEXIDINE GLUCONATE CLOTH 2 % EX PADS
6.0000 | MEDICATED_PAD | Freq: Every day | CUTANEOUS | Status: DC
  Administered 2023-08-30: 6 via TOPICAL

## 2023-08-30 NOTE — Progress Notes (Signed)
 This RN called the Ortho Office and confirmed that this patient should be switched to a prevena wound vac.  Patient has been switched over

## 2023-08-30 NOTE — Progress Notes (Signed)
 Peripherally Inserted Central Catheter Placement  The IV Nurse has discussed with the patient and/or persons authorized to consent for the patient, the purpose of this procedure and the potential benefits and risks involved with this procedure.  The benefits include less needle sticks, lab draws from the catheter, and the patient may be discharged home with the catheter. Risks include, but not limited to, infection, bleeding, blood clot (thrombus formation), and puncture of an artery; nerve damage and irregular heartbeat and possibility to perform a PICC exchange if needed/ordered by physician.  Alternatives to this procedure were also discussed.  Bard Power PICC patient education guide, fact sheet on infection prevention and patient information card has been provided to patient /or left at bedside.    PICC Placement Documentation  PICC Single Lumen 08/30/23 Right Cephalic 41 cm 1 cm (Active)  Indication for Insertion or Continuance of Line Prolonged intravenous therapies 08/30/23 0834  Exposed Catheter (cm) 1 cm 08/30/23 0834  Site Assessment Clean, Dry, Intact 08/30/23 0834  Line Status Flushed;Blood return noted;Saline locked 08/30/23 0834  Dressing Type Transparent 08/30/23 0834  Dressing Status Antimicrobial disc/dressing in place 08/30/23 0834  Line Care Connections checked and tightened 08/30/23 0834  Line Adjustment (NICU/IV Team Only) No 08/30/23 0834  Dressing Intervention New dressing 08/30/23 0834  Dressing Change Due 09/06/23 08/30/23 0834       Louvella Royalty 08/30/2023, 8:36 AM

## 2023-08-30 NOTE — Progress Notes (Signed)
 AVS reviewed.  Patient Excited for discharge.  Report Called to [facility].

## 2023-08-30 NOTE — Progress Notes (Signed)
 Mobility Specialist Progress Note:    08/30/23 0900  Mobility  Activity Stood at bedside  Level of Assistance Minimal assist, patient does 75% or more  Assistive Device Front wheel walker  LLE Weight Bearing Per Provider Order WBAT  Activity Response Tolerated well  Mobility Referral Yes  Mobility visit 1 Mobility  Mobility Specialist Start Time (ACUTE ONLY) 0850  Mobility Specialist Stop Time (ACUTE ONLY) 0859  Mobility Specialist Time Calculation (min) (ACUTE ONLY) 9 min   Pt received in bed and agreeable. Requesting to don clothes for d/c. Able to come EOB w/ increased time. MS assisted w/ donning clothing and stood w/ minA to pull pants up. No complaints throughout. Pt left on EOB with call bell and all needs met.  D'Vante Nolon Baxter Mobility Specialist Please contact via Special educational needs teacher or Rehab office at [phone]

## 2023-08-30 NOTE — Progress Notes (Signed)
 PROGRESS NOTE  Andrew Larsen:[id number] DOB: [birth year]   PCP: Barnetta Liberty, MD  Patient is from: Home.  Lives with family.  DOA: 08/27/2023 LOS: 3  Chief complaints Infection of left ankle prosthesis    Brief Narrative / Interim history: -year-old M with PMH of left ankle fracture s/p repair on 3/23, HTN, T2DM, CKD 3A, ILD, inclusion body myositis, obesity, OSA on CPAP, PAD s/p angioplasty of left SFA 2018, prostate cancer s/p radiation, presenting with left ankle prosthesis infection.  Patient went to see his PCP due to concern for delayed surgical wound healing and some yellow/clear drainage.  He was sent to ED due to concern for infection.  In ED, vital stable without fever.  No leukocytosis.  He was taken to the OR for I&D, hardware removal and VAC placement.  Cultures obtained.  Started on broad-spectrum antibiotics.  ID consulted.  Hospitalist service consulted for help with management of chronic comorbidities.  The next day, blood culture with Enterobacter cloacae in 1 out of 2 bottles.  Antibiotics de-escalated to IV cefepime .  ID recommends PICC line on 5/2 for outpatient IV antibiotics.    Subjective: Seen and examined earlier this morning.  No major events overnight of this morning.  No complaints. Pain fairly controlled. Having bowel movements. Daughter at bedside.  Objective: Vitals:   08/29/23 2112 08/30/23 0345 08/30/23 0500 08/30/23 0734  BP: (!) 144/71 (!) 153/68  111/66  Pulse: 83 73  94  Resp:  17  17  Temp:  98 F (36.7 C)  98.4 F (36.9 C)  TempSrc:    Oral  SpO2:  100%  100%  Weight:   120.4 kg   Height:        Examination:  GENERAL: No apparent distress.  Nontoxic. Sitting on the edge of the bed HEENT: MMM.  Vision and hearing grossly intact.  NECK: Supple.  No apparent JVD.  RESP:  No IWOB.  Fair aeration bilaterally. CVS:  RRR. Heart sounds normal.  ABD/GI/GU: BS+. Abd soft, NTND.  MSK/EXT:  Moves extremities.  Postop boot and wound  VAC in place. SKIN: no apparent skin lesion or wound NEURO: Awake, alert and oriented appropriately.  No apparent focal neuro deficit. PSYCH: Calm. Normal affect.   Consultants:  Orthopedic surgery Infectious disease  Procedures: 4/29-I&D of left ankle abscess with removal of hardware and application of wound VAC  Microbiology summarized: 4/29-blood culture with Enterobacter cloacae in 1 out of 2 bottles 4/29-surgical tissue culture with moderate GNR.  Assessment and plan: Left ankle abscess with hardware infection: had recent ankle surgery on 3/23 for ankle fracture after mechanical fall.  Presented with slow healing surgical wound and drainage.  No fever or leukocytosis.  CRP 4.8.  ESR 42. -S/p I&D with removal of hardware and application of wound VAC by Dr. [name] on 4/29 -Surgical culture with moderate GNR.  Blood culture with Enterobacter cloacae in 1 out of 2 bottles. -PICC line placed. IV Cefepime  unitl 10/09/2023 per ID -Pain control and VTE prophylaxis per surgery -Wound care and therapy per primary -Bowel regimen  Enterobacter cloacae bacteremia: Blood culture as above -Antibiotics as above  IDDM-2 with hyperglycemia: A1c 7.3 on 3/23.  On Novolin 70/30 at 50u BID and Mounjaro at home Recent Labs  Lab 08/29/23 0605 08/29/23 1149 08/29/23 1524 08/29/23 2202 08/30/23 0602  GLUCAP 103* 164* 180* 134* 151*  - Continue home Novolin 70/30 and Mounjaro   Essential hypertension: Normotensive for most part. -Continue home metoprolol  and losartan   PAD s/p angioplasty of left SFA 2018 -Continue home Plavix , Crestor , Zetia  and aspirin    Chronic LE edema: TTE on 3/24 without significant finding. -Continue home p.o. Lasix    CKD 3A: Baseline Cr ~1.2-1.4.  Stable -Monitor   OSA - Ensure he wears CPAP at night   Inclusion body myositis: Noted on workup at [facility], has positive NT5C1A antibody -PT/OT eval   BPH - Continue home Flomax    Hx of stage 1 prostate cancer s/p  radiation therapy -follows urology for PSA monitoring posttreatment  Constipation: Resolved. -Continue bowel regimen.     Medically stable for discharge from hospitalist standpoint.  Med rec completed.  Defer pain management to primary and antibiotics to ID  Class II obesity Body mass index is 37.02 kg/m. -Already on [facility] outpatient. -Encourage lifestyle change to lose weight   DVT prophylaxis:  SCDs Start: 08/27/23 1258  Code Status: Full code Family Communication: None at bedside today. Level of care: Med-Surg Status is: Inpatient   Final disposition: Per primary   55 minutes with more than 50% spent in reviewing records, counseling patient/family and coordinating care.   Sch Meds:  Scheduled Meds:  clopidogrel   75 mg Oral Daily   dorzolamide   1 drop Right Eye BID   enoxaparin  (LOVENOX ) injection  60 mg Subcutaneous Q24H   ezetimibe   10 mg Oral QPC supper   furosemide   40 mg Oral Daily   gabapentin   600 mg Oral TID   insulin  aspart  0-15 Units Subcutaneous TID WC   insulin  aspart protamine- aspart  25 Units Subcutaneous BID WC   latanoprost   1 drop Both Eyes QHS   losartan   25 mg Oral Daily   metoprolol  tartrate  25 mg Oral BID   multivitamin with minerals  1 tablet Oral Q breakfast   [START ON 09/01/2023] rosuvastatin   10 mg Oral Q Sun   senna-docusate  1 tablet Oral BID   tamsulosin   0.4 mg Oral Daily   timolol   1 drop Right Eye BID   Continuous Infusions:  ceFEPime  (MAXIPIME ) IV 2 g (08/29/23 2104)   PRN Meds:.acetaminophen , bisacodyl , diphenhydrAMINE , HYDROcodone -acetaminophen , magnesium  citrate, metoCLOPramide  **OR** metoCLOPramide  (REGLAN ) injection, morphine  injection, ondansetron  **OR** ondansetron  (ZOFRAN ) IV, oxyCODONE , polyethylene glycol  Antimicrobials: Anti-infectives (From admission, onward)    Start     Dose/Rate Route Frequency Ordered Stop   08/29/23 2200  ceFEPIme  (MAXIPIME ) 2 g in sodium chloride  0.9 % 100 mL IVPB        2 g 200  mL/hr over 30 Minutes Intravenous Every 12 hours 08/29/23 1339     08/29/23 0000  ceFEPime  (MAXIPIME ) IVPB        2 g Intravenous Every 12 hours 08/29/23 1341 10/09/23 2359   08/28/23 1000  ceFEPIme  (MAXIPIME ) 2 g in sodium chloride  0.9 % 100 mL IVPB  Status:  Discontinued        2 g 200 mL/hr over 30 Minutes Intravenous Every 8 hours 08/28/23 0826 08/29/23 1339   08/27/23 1730  DAPTOmycin  (CUBICIN ) IVPB 700 mg/100mL premix  Status:  Discontinued        8 mg/kg  92.7 kg (Adjusted) 200 mL/hr over 30 Minutes Intravenous Daily 08/27/23 1642 08/28/23 1019   08/27/23 1715  cefTRIAXone  (ROCEPHIN ) 2 g in sodium chloride  0.9 % 100 mL IVPB  Status:  Discontinued        2 g 200 mL/hr over 30 Minutes Intravenous Every 24 hours 08/27/23 1622 08/28/23 0826   08/27/23 1115  vancomycin  (VANCOCIN ) powder  Status:  Discontinued          As needed 08/27/23 1121 08/27/23 1147        I have personally reviewed the following labs and images: CBC: Recent Labs  Lab 08/27/23 1404 08/28/23 0614 08/29/23 0542  WBC 5.3 9.2 6.1  HGB 13.5 13.0 13.2  HCT 40.3 38.1* 40.2  MCV 92.6 91.4 92.8  PLT 184 217 203   BMP &GFR Recent Labs  Lab 08/27/23 1404 08/28/23 0614 08/29/23 0542  NA  --  133* 135  K  --  4.5 4.4  CL  --  99 100  CO2  --  24 23  GLUCOSE  --  151* 111*  BUN  --  23 26*  CREATININE 1.21 1.24 1.30*  CALCIUM   --  9.0 9.0  MG  --   --  2.2   Estimated Creatinine Clearance: 59.8 mL/min (A) (by C-G formula based on SCr of 1.3 mg/dL (H)). Liver & Pancreas: Recent Labs  Lab 08/28/23 0614 08/29/23 0542  AST 28 27  ALT 29 24  ALKPHOS 101 94  BILITOT 0.7 0.7  PROT 6.9 7.0  ALBUMIN 3.1* 3.3*   No results for input(s): "LIPASE", "AMYLASE" in the last 168 hours. No results for input(s): "AMMONIA" in the last 168 hours. Diabetic: No results for input(s): "HGBA1C" in the last 72 hours. Recent Labs  Lab 08/29/23 0605 08/29/23 1149 08/29/23 1524 08/29/23 2202 08/30/23 0602  GLUCAP  103* 164* 180* 134* 151*   Cardiac Enzymes: Recent Labs  Lab 08/28/23 0614  CKTOTAL 288   No results for input(s): "PROBNP" in the last 8760 hours. Coagulation Profile: No results for input(s): "INR", "PROTIME" in the last 168 hours. Thyroid  Function Tests: No results for input(s): "TSH", "T4TOTAL", "FREET4", "T3FREE", "THYROIDAB" in the last 72 hours. Lipid Profile: No results for input(s): "CHOL", "HDL", "LDLCALC", "TRIG", "CHOLHDL", "LDLDIRECT" in the last 72 hours. Anemia Panel: No results for input(s): "VITAMINB12", "FOLATE", "FERRITIN", "TIBC", "IRON", "RETICCTPCT" in the last 72 hours. Urine analysis:    Component Value Date/Time   COLORURINE YELLOW 09/01/2021 1758   APPEARANCEUR CLEAR 09/01/2021 1758   LABSPEC 1.011 09/01/2021 1758   PHURINE 6.0 09/01/2021 1758   GLUCOSEU NEGATIVE 09/01/2021 1758   HGBUR SMALL (A) 09/01/2021 1758   BILIRUBINUR NEGATIVE 09/01/2021 1758   KETONESUR NEGATIVE 09/01/2021 1758   PROTEINUR NEGATIVE 09/01/2021 1758   NITRITE NEGATIVE 09/01/2021 1758   LEUKOCYTESUR SMALL (A) 09/01/2021 1758   Sepsis Labs: Invalid input(s): "PROCALCITONIN", "LACTICIDVEN"  Microbiology: Recent Results (from the past 240 hours)  Aerobic/Anaerobic Culture w Gram Stain (surgical/deep wound)     Status: None (Preliminary result)   Collection Time: 08/27/23 11:04 AM   Specimen: Wound  Result Value Ref Range Status   Specimen Description WOUND  Final   Special Requests NONE  Final   Gram Stain   Final    NO WBC SEEN NO ORGANISMS SEEN Performed at [facility], [address] [place], [place] [address]    Culture   Final    MODERATE ENTEROBACTER CLOACAE NO ANAEROBES ISOLATED; CULTURE IN PROGRESS FOR 5 DAYS    Report Status PENDING  Incomplete   Organism ID, Bacteria ENTEROBACTER CLOACAE  Final      Susceptibility   Enterobacter cloacae - MIC*    CEFEPIME  <=0.12 SENSITIVE Sensitive     CEFTAZIDIME <=1 SENSITIVE Sensitive     CIPROFLOXACIN <=0.25  SENSITIVE Sensitive     GENTAMICIN <=1 SENSITIVE Sensitive     IMIPENEM <=  0.25 SENSITIVE Sensitive     TRIMETH/SULFA <=20 SENSITIVE Sensitive     PIP/TAZO <=4 SENSITIVE Sensitive ug/mL    * MODERATE ENTEROBACTER CLOACAE  Culture, blood (Routine X 2) w Reflex to ID Panel     Status: None (Preliminary result)   Collection Time: 08/27/23  2:04 PM   Specimen: BLOOD  Result Value Ref Range Status   Specimen Description BLOOD SITE NOT SPECIFIED  Final   Special Requests   Final    BOTTLES DRAWN AEROBIC AND ANAEROBIC Blood Culture adequate volume   Culture   Final    NO GROWTH 3 DAYS Performed at [facility], [address], [place], [place] [address]    Report Status PENDING  Incomplete  Culture, blood (Routine X 2) w Reflex to ID Panel     Status: Abnormal   Collection Time: 08/27/23  2:11 PM   Specimen: BLOOD  Result Value Ref Range Status   Specimen Description BLOOD SITE NOT SPECIFIED  Final   Special Requests   Final    BOTTLES DRAWN AEROBIC AND ANAEROBIC Blood Culture adequate volume   Culture  Setup Time   Final    GRAM NEGATIVE RODS AEROBIC BOTTLE ONLY CRITICAL RESULT CALLED TO, READ BACK BY AND VERIFIED WITH: J LEDFORD,PHARMD@[email or web address]  08/28/23 MK Performed at [facility], [address], [place], [place] [address]    Culture ENTEROBACTER CLOACAE (A)  Final   Report Status 08/30/2023 FINAL  Final   Organism ID, Bacteria ENTEROBACTER CLOACAE  Final      Susceptibility   Enterobacter cloacae - MIC*    CEFEPIME  <=0.12 SENSITIVE Sensitive     CEFTAZIDIME <=1 SENSITIVE Sensitive     CIPROFLOXACIN <=0.25 SENSITIVE Sensitive     GENTAMICIN <=1 SENSITIVE Sensitive     IMIPENEM 1 SENSITIVE Sensitive     TRIMETH/SULFA <=20 SENSITIVE Sensitive     PIP/TAZO <=4 SENSITIVE Sensitive ug/mL    * ENTEROBACTER CLOACAE  Blood Culture ID Panel (Reflexed)     Status: Abnormal   Collection Time: 08/27/23  2:11 PM  Result Value Ref Range Status   Enterococcus faecalis NOT DETECTED  NOT DETECTED Final   Enterococcus Faecium NOT DETECTED NOT DETECTED Final   Listeria monocytogenes NOT DETECTED NOT DETECTED Final   Staphylococcus species NOT DETECTED NOT DETECTED Final   Staphylococcus aureus (BCID) NOT DETECTED NOT DETECTED Final   Staphylococcus epidermidis NOT DETECTED NOT DETECTED Final   Staphylococcus lugdunensis NOT DETECTED NOT DETECTED Final   Streptococcus species NOT DETECTED NOT DETECTED Final   Streptococcus agalactiae NOT DETECTED NOT DETECTED Final   Streptococcus pneumoniae NOT DETECTED NOT DETECTED Final   Streptococcus pyogenes NOT DETECTED NOT DETECTED Final   A.calcoaceticus-baumannii NOT DETECTED NOT DETECTED Final   Bacteroides fragilis NOT DETECTED NOT DETECTED Final   Enterobacterales DETECTED (A) NOT DETECTED Final    Comment: Enterobacterales represent a large order of gram negative bacteria, not a single organism. CRITICAL RESULT CALLED TO, READ BACK BY AND VERIFIED WITH: J LEDFORD,PHARMD@[email or web address]  08/28/23 MK    Enterobacter cloacae complex DETECTED (A) NOT DETECTED Final    Comment: CRITICAL RESULT CALLED TO, READ BACK BY AND VERIFIED WITH: J LEDFORD,PHARMD@[email or web address]  08/28/23 MK    Escherichia coli NOT DETECTED NOT DETECTED Final   Klebsiella aerogenes NOT DETECTED NOT DETECTED Final   Klebsiella oxytoca NOT DETECTED NOT DETECTED Final   Klebsiella pneumoniae NOT DETECTED NOT DETECTED Final   Proteus species NOT DETECTED NOT DETECTED Final   Salmonella species NOT DETECTED  NOT DETECTED Final   Serratia marcescens NOT DETECTED NOT DETECTED Final   Haemophilus influenzae NOT DETECTED NOT DETECTED Final   Neisseria meningitidis NOT DETECTED NOT DETECTED Final   Pseudomonas aeruginosa NOT DETECTED NOT DETECTED Final   Stenotrophomonas maltophilia NOT DETECTED NOT DETECTED Final   Candida albicans NOT DETECTED NOT DETECTED Final   Candida auris NOT DETECTED NOT DETECTED Final   Candida glabrata NOT DETECTED NOT DETECTED Final   Candida krusei NOT  DETECTED NOT DETECTED Final   Candida parapsilosis NOT DETECTED NOT DETECTED Final   Candida tropicalis NOT DETECTED NOT DETECTED Final   Cryptococcus neoformans/gattii NOT DETECTED NOT DETECTED Final   CTX-M ESBL NOT DETECTED NOT DETECTED Final   Carbapenem resistance IMP NOT DETECTED NOT DETECTED Final   Carbapenem resistance KPC NOT DETECTED NOT DETECTED Final   Carbapenem resistance NDM NOT DETECTED NOT DETECTED Final   Carbapenem resist OXA 48 LIKE NOT DETECTED NOT DETECTED Final   Carbapenem resistance VIM NOT DETECTED NOT DETECTED Final    Comment: Performed at [facility], [address] [place], [place] [address]    Radiology Studies: US  EKG SITE RITE Result Date: 08/29/2023 If Site Rite image not attached, placement could not be confirmed due to current cardiac rhythm.     Iracema Lanagan T. Rhylin Venters [facility]  If 7PM-7AM, please contact night-coverage [email or web address] 08/30/2023, 8:25 AM

## 2023-08-30 NOTE — Progress Notes (Signed)
 Patient ready to discharge and could not find printed prescription for Cefepime .  Both ortho, and hospitalist paged.  No new orders.  PTAR was not able to wait any longer

## 2023-08-30 NOTE — TOC Progression Note (Signed)
 Transition of Care [facility]) - Progression Note    Patient Details  Name: Andrew Larsen MRN: [id number] Date of Birth: Nov 17, 1940  Transition of Care [facility]) CM/SW Contact  Elspeth Hals, LCSW Phone Number: 08/30/2023, 1:22 PM  Clinical Narrative:   TC Tammy/HTA: SNF auth approved, 7 days: [id number].  Pt is in copay days, day#35.  PTAR approved: [id number].  CSW confirmed with Andrea/[facility] that they can receive pt today.    TC Meghan Gawne PA-she will complete DC summary.    Expected Discharge Plan: Skilled Nursing Facility Barriers to Discharge: Continued Medical Work up  Expected Discharge Plan and Services In-house Referral: Clinical Social Work   Post Acute Care Choice:  (TBD) Living arrangements for the past 2 months: Single Family Home                                       Social Determinants of Health (SDOH) Interventions SDOH Screenings   Food Insecurity: No Food Insecurity (08/27/2023)  Housing: Low Risk  (08/27/2023)  Transportation Needs: No Transportation Needs (08/27/2023)  Utilities: Not At Risk (08/27/2023)  Alcohol  Screen: Low Risk  (10/31/2022)  Depression (PHQ2-9): Low Risk  (10/31/2022)  Social Connections: Moderately Integrated (08/27/2023)  Tobacco Use: Medium Risk (08/27/2023)    Readmission Risk Interventions     No data to display

## 2023-08-30 NOTE — TOC Transition Note (Signed)
 Transition of Care [facility]) - Discharge Note   Patient Details  Name: Andrew Larsen MRN: [id number] Date of Birth: 03/19/1941  Transition of Care [facility]) CM/SW Contact:  Elspeth Hals, LCSW Phone Number: 08/30/2023, 2:02 PM   Clinical Narrative:  Pt discharging to [facility].  RN call report to [phone].  PTAR called 1400.      Final next level of care: Skilled Nursing Facility Barriers to Discharge: Barriers Resolved   Patient Goals and CMS Choice Patient states their goals for this hospitalization and ongoing recovery are:: be independent   Choice offered to / list presented to : Adult Children (daughter Adriana Hopping)      Discharge Placement              Patient chooses bed at: [facility] Patient to be transferred to facility by: ptar Name of family member notified: daughter Adriana Hopping in room Patient and family notified of of transfer: 08/30/23  Discharge Plan and Services Additional resources added to the After Visit Summary for   In-house Referral: Clinical Social Work   Post Acute Care Choice:  (TBD)                               Social Drivers of Health (SDOH) Interventions SDOH Screenings   Food Insecurity: No Food Insecurity (08/27/2023)  Housing: Low Risk  (08/27/2023)  Transportation Needs: No Transportation Needs (08/27/2023)  Utilities: Not At Risk (08/27/2023)  Alcohol  Screen: Low Risk  (10/31/2022)  Depression (PHQ2-9): Low Risk  (10/31/2022)  Social Connections: Moderately Integrated (08/27/2023)  Tobacco Use: Medium Risk (08/27/2023)     Readmission Risk Interventions     No data to display

## 2023-09-01 LAB — CULTURE, BLOOD (ROUTINE X 2)
Culture: NO GROWTH
Special Requests: ADEQUATE

## 2023-09-01 LAB — AEROBIC/ANAEROBIC CULTURE W GRAM STAIN (SURGICAL/DEEP WOUND): Gram Stain: NONE SEEN

## 2023-09-02 DIAGNOSIS — M65072 Abscess of tendon sheath, left ankle and foot: Secondary | ICD-10-CM | POA: Diagnosis not present

## 2023-09-04 ENCOUNTER — Other Ambulatory Visit: Payer: Self-pay | Admitting: Student

## 2023-09-04 ENCOUNTER — Encounter: Payer: Self-pay | Admitting: Student

## 2023-09-04 ENCOUNTER — Non-Acute Institutional Stay (SKILLED_NURSING_FACILITY): Payer: Self-pay | Admitting: Student

## 2023-09-04 DIAGNOSIS — T8459XA Infection and inflammatory reaction due to other internal joint prosthesis, initial encounter: Secondary | ICD-10-CM | POA: Diagnosis not present

## 2023-09-04 DIAGNOSIS — R7881 Bacteremia: Secondary | ICD-10-CM | POA: Diagnosis not present

## 2023-09-04 DIAGNOSIS — B9689 Other specified bacterial agents as the cause of diseases classified elsewhere: Secondary | ICD-10-CM | POA: Diagnosis not present

## 2023-09-04 DIAGNOSIS — Z96662 Presence of left artificial ankle joint: Secondary | ICD-10-CM | POA: Diagnosis not present

## 2023-09-04 DIAGNOSIS — Z7189 Other specified counseling: Secondary | ICD-10-CM

## 2023-09-04 DIAGNOSIS — S82852A Displaced trimalleolar fracture of left lower leg, initial encounter for closed fracture: Secondary | ICD-10-CM | POA: Diagnosis not present

## 2023-09-04 DIAGNOSIS — R52 Pain, unspecified: Secondary | ICD-10-CM | POA: Diagnosis not present

## 2023-09-04 DIAGNOSIS — K5900 Constipation, unspecified: Secondary | ICD-10-CM

## 2023-09-04 DIAGNOSIS — E1165 Type 2 diabetes mellitus with hyperglycemia: Secondary | ICD-10-CM | POA: Diagnosis not present

## 2023-09-04 DIAGNOSIS — I739 Peripheral vascular disease, unspecified: Secondary | ICD-10-CM

## 2023-09-04 MED ORDER — OXYCODONE HCL 10 MG PO TABA: 1.0000 | ORAL_TABLET | Freq: Three times a day (TID) | ORAL | 0 refills | Status: DC

## 2023-09-04 MED ORDER — OXYCODONE HCL 5 MG PO TABS: 5.0000 mg | ORAL_TABLET | Freq: Three times a day (TID) | ORAL | 0 refills | Status: DC | PRN

## 2023-09-04 NOTE — Progress Notes (Unsigned)
 Provider:  Dr. Valrie Gehrig Location:  Other [place].  Nursing Home Room Number: [facility] 109A Place of Service:  SNF (31)  PCP: Barnetta Liberty, MD Patient Care Team: Barnetta Liberty, MD as PCP - General (Internal Medicine) Barnetta Liberty, MD (Internal Medicine) Katheleen Palmer, RN as Oncology Nurse Navigator  Extended Emergency Contact Information Primary Emergency Contact: [facility] Address: [address]          [place], [place] [address] [place] Home Phone: [phone] Mobile Phone: [phone] Relation: Daughter  Code Status: DNR Goals of Care: Advanced Directive information    08/26/2023    1:33 PM  Advanced Directives  Does Patient Have a Medical Advance Directive? Yes  Type of Estate agent of [place];Living will  Does patient want to make changes to medical advance directive? No - Patient declined      Chief Complaint  Patient presents with   New Admit To SNF    Admission.     HPI: Patient is a 83 y.o. male seen today for admission to [facility]   Past Medical History:  Diagnosis Date   Arthritis    CHF (congestive heart failure) (HCC)    per pt's daughter   CKD (chronic kidney disease)    CKD stage III (01/2018)   Daytime somnolence    Diabetes mellitus without complication (HCC)    Fracture    right fibula/wears boot cast   GERD (gastroesophageal reflux disease)    Glaucoma    Hypercholesteremia    Hyperlipidemia    Hypertension    Inclusion body myositis    diagnosed 2020   Mold exposure    7 yrs ago   PAD (peripheral artery disease) (HCC)    Prostate cancer (HCC)    Sleep apnea    uses cpap   Uses walker    Wears glasses    Past Surgical History:  Procedure Laterality Date   ALLOGRAFT APPLICATION Left 08/27/2023   Procedure: APPLICATION, ALLOGRAFT, SKIN;  Surgeon: Saundra Curl, MD;  Location: [facility] OR;  Service: Orthopedics;  Laterality: Left;   APPLICATION OF WOUND VAC Left  08/27/2023   Procedure: APPLICATION, WOUND VAC;  Surgeon: Saundra Curl, MD;  Location: [facility] OR;  Service: Orthopedics;  Laterality: Left;   GOLD SEED IMPLANT N/A 01/08/2023   Procedure: GOLD SEED IMPLANT;  Surgeon: Mallie Seal, MD;  Location: [facility];  Service: Urology;  Laterality: N/A;  30 MINUTES   HARDWARE REMOVAL Left 08/27/2023   Procedure: REMOVAL, HARDWARE;  Surgeon: Saundra Curl, MD;  Location: [facility] OR;  Service: Orthopedics;  Laterality: Left;   INCISION AND DRAINAGE OF DEEP ABSCESS, ANKLE Left 08/27/2023   Procedure: INCISION AND DRAINAGE OF DEEP ABSCESS, ANKLE;  Surgeon: Saundra Curl, MD;  [facility]: [facility] OR;  Service: Orthopedics;  Laterality: Left;   LOWER EXTREMITY ANGIOGRAPHY N/A 06/12/2016   Procedure: Lower Extremity Angiography;  Surgeon: Knox Perl, MD;  Location: [facility] INVASIVE CV LAB;  Service: Cardiovascular;  Laterality: N/A;   MUSCLE BIOPSY Right 05/06/2018   Procedure: right vastus lateralis muscle biopsy;  Surgeon: Agustina Aldrich, MD;  Location: [facility] OR;  Service: Neurosurgery;  Laterality: Right;   ORIF ANKLE FRACTURE Right 10/02/2012   Procedure: OPEN REDUCTION INTERNAL FIXATION (ORIF) ANKLE FRACTURE;  Surgeon: Jasmine Mesi, MD;  Location: [facility] ORS;  Service: Orthopedics;  Laterality: Right;   ORIF ANKLE FRACTURE Left 07/21/2023   Procedure: OPEN REDUCTION INTERNAL FIXATION (ORIF) ANKLE FRACTURE;  Surgeon:  Saundra Curl, MD;  Location: [facility] ORS;  Service: Orthopedics;  Laterality: Left;   PERIPHERAL VASCULAR ATHERECTOMY Left 06/12/2016   Procedure: Peripheral Vascular Atherectomy-Left Popliteal;  Surgeon: Knox Perl, MD;  Location: [facility] INVASIVE CV LAB;  Service: Cardiovascular;  Laterality: Left;   PERIPHERAL VASCULAR INTERVENTION Left 06/12/2016   Procedure: Peripheral Vascular Intervention- DCB Left Popliteal;  Surgeon: Knox Perl, MD;  Location: [facility] INVASIVE CV LAB;  Service: Cardiovascular;  Laterality: Left;   PROSTATE BIOPSY     ROTATION  FLAP Left 08/27/2023   Procedure: CREATION, FLAP, ROTATION;  Surgeon: Saundra Curl, MD;  [facility]: [facility];  Service: Orthopedics;  Laterality: Left;   SPACE OAR INSTILLATION N/A 01/08/2023   Procedure: SPACE OAR INSTILLATION;  Surgeon: Mallie Seal, MD;  Location: [facility];  Service: Urology;  Laterality: N/A;    reports that he quit smoking about 42 years ago. His smoking use included cigarettes. He started smoking about 62 years ago. He has a 20 pack-year smoking history. He has never used smokeless tobacco. He reports that he does not drink alcohol  and does not use drugs. Social History   Socioeconomic History   Marital status: Widowed    Spouse name: Not on file   Number of children: 1   Years of education: Not on file   Highest education level: Not on file  Occupational History   Not on file  Tobacco Use   Smoking status: Former    Current packs/day: 0.00    Average packs/day: 1 pack/day for 20.0 years (20.0 ttl pk-yrs)    Types: Cigarettes    Start date: 08/22/1961    Quit date: 08/22/1981    Years since quitting: 42.0   Smokeless tobacco: Never  Vaping Use   Vaping status: Never Used  Substance and Sexual Activity   Alcohol  use: No    Alcohol /week: 0.0 standard drinks of alcohol    Drug use: No   Sexual activity: Never  Other Topics Concern   Not on file  Social History Narrative   ** Merged History Encounter **       Social Drivers of Health   Financial Resource Strain: Not on file  Food Insecurity: No Food Insecurity (08/27/2023)   Hunger Vital Sign    Worried About Running Out of Food in the Last Year: Never true    Ran Out of Food in the Last Year: Never true  Transportation Needs: No Transportation Needs (08/27/2023)   PRAPARE - Administrator, Civil Service (Medical): No    Lack of Transportation (Non-Medical): No  Physical Activity: Not on file  Stress: Not on file  Social Connections: Moderately Integrated (08/27/2023)    Social Connection and Isolation Panel [NHANES]    Frequency of Communication with Friends and Family: More than three times a week    Frequency of Social Gatherings with Friends and Family: More than three times a week    Attends Religious Services: More than 4 times per year    Active Member of [employer] or Organizations: Yes    Attends Banker Meetings: More than 4 times per year    Marital Status: Widowed  Intimate Partner Violence: Not At Risk (08/27/2023)   Humiliation, Afraid, Rape, and Kick questionnaire    Fear of Current or Ex-Partner: No    Emotionally Abused: No    Physically Abused: No    Sexually Abused: No    Functional Status Survey:    Family History  Problem  Relation Age of Onset   Heart disease Father    Prostate cancer Brother    Cancer Brother    Diabetes Brother    Colon cancer Neg Hx    Sleep apnea Neg Hx     Health Maintenance  Topic Date Due   FOOT EXAM  Never done   OPHTHALMOLOGY EXAM  Never done   Diabetic kidney evaluation - Urine ACR  Never done   COVID-19 Vaccine (8 - 2024-25 season) 03/30/2024 (Originally 06/13/2023)   INFLUENZA VACCINE  11/29/2023   HEMOGLOBIN A1C  01/21/2024   Medicare Annual Wellness (AWV)  03/18/2024   Diabetic kidney evaluation - eGFR measurement  08/28/2024   DTaP/Tdap/Td (3 - Td or Tdap) 08/09/2032   Pneumonia Vaccine 67+ Years old  Completed   Zoster Vaccines- Shingrix  Completed   HPV VACCINES  Aged Out   Meningococcal B Vaccine  Aged Out    Allergies  Allergen Reactions   Metformin And Related Shortness Of Breath   Pollen Extract Itching and Other (See Comments)    Some wheezing, itchy eyes, runny nose   Simbrinza [Brinzolamide-Brimonidine ] Other (See Comments)    Drainage, redness to eyes    Outpatient Encounter Medications as of 09/04/2023  Medication Sig   aspirin  EC 81 MG tablet Take 1 tablet (81 mg total) by mouth daily. Swallow whole.   ceFEPime  (MAXIPIME ) IVPB Inject 2 g into the vein every  12 (twelve) hours. Indication:  E cloacae osteomyelitis and bacteremia First Dose: Yes Last Day of Therapy:  10/09/23 Labs - Once weekly:  CBC/D and BMP, Labs - Once weekly: ESR and CRP   clopidogrel  (PLAVIX ) 75 MG tablet Take 1 tablet (75 mg total) by mouth daily.   Continuous Blood Gluc Sensor (FREESTYLE LIBRE 2 SENSOR) MISC Inject 1 Device into the skin every 14 (fourteen) days.   docusate sodium  (COLACE) 100 MG capsule Take 1 capsule (100 mg total) by mouth 2 (two) times daily as needed for mild constipation.   dorzolamide  (TRUSOPT ) 2 % ophthalmic solution Place 1 drop into the right eye 2 (two) times daily.   ezetimibe  (ZETIA ) 10 MG tablet TAKE 1 TABLET(10 MG) BY MOUTH DAILY AFTER SUPPER   furosemide  (LASIX ) 40 MG tablet Take 1 tablet (40 mg total) by mouth daily.   gabapentin  (NEURONTIN ) 300 MG capsule Take 2 capsules (600 mg total) by mouth 3 (three) times daily.   heparin  5000 UNIT/ML injection Use 3cc intravenously every 12 hours for PICC line   latanoprost  (XALATAN ) 0.005 % ophthalmic solution Place 1 drop into both eyes at bedtime.   losartan  (COZAAR ) 25 MG tablet TAKE 1 TABLET(25 MG) BY MOUTH DAILY   melatonin 5 MG TABS Take 5 mg by mouth at bedtime.   metoprolol  tartrate (LOPRESSOR ) 25 MG tablet Take 1 tablet (25 mg total) by mouth 2 (two) times daily.   Multiple Vitamin (MULTIVITAMIN WITH MINERALS) TABS tablet Take 1 tablet by mouth daily with breakfast.   NOVOLIN 70/30 (70-30) 100 UNIT/ML injection Inject 50 Units into the skin in the morning and at bedtime.   oxyCODONE  (ROXICODONE ) 5 MG immediate release tablet Take 1 tablet (5 mg total) by mouth every 8 (eight) hours as needed for severe pain (pain score 7-10). after repeat ankle surgery   oxyCODONE  HCl 10 MG TABA Take 1 tablet by mouth every 8 (eight) hours.   polyethylene glycol (MIRALAX  / GLYCOLAX ) 17 g packet Take 17 g by mouth daily as needed.   PRESCRIPTION MEDICATION CPAP- At bedtime  rosuvastatin  (CRESTOR ) 10 MG tablet  Take 10 mg by mouth daily.   senna (SENOKOT) 8.6 MG TABS tablet Take 2 tablets by mouth at bedtime.   tamsulosin  (FLOMAX ) 0.4 MG CAPS capsule Take 0.4 mg by mouth daily.   timolol  (TIMOPTIC ) 0.5 % ophthalmic solution Place 1 drop into the right eye 2 (two) times daily.   tirzepatide (MOUNJARO) 2.5 MG/0.5ML Pen 2.5mg  Subcutaneous once aweek for 28 days (Patient not taking: Reported on 09/04/2023)   No facility-administered encounter medications on file as of 09/04/2023.    Review of Systems  Vitals:   09/04/23 0910  BP: 111/62  Pulse: 74  Resp: 18  Temp: 97.6 F (36.4 C)  SpO2: 98%  Weight: 260 lb 8 oz (118.2 kg)  Height: 5\' 11"  (1.803 m)   Body mass index is 36.33 kg/m. Physical Exam  Labs reviewed: Basic Metabolic Panel: Recent Labs    07/23/23 0447 07/29/23 0000 08/27/23 1404 08/28/23 0614 08/29/23 0542  NA 134* 136*  --  133* 135  K 4.6 4.5  --  4.5 4.4  CL 101 102  --  99 100  CO2 27 29*  --  24 23  GLUCOSE 135*  --   --  151* 111*  BUN 28* 27*  --  23 26*  CREATININE 1.24 1.3 1.21 1.24 1.30*  CALCIUM  8.3* 8.1*  --  9.0 9.0  MG  --   --   --   --  2.2   Liver Function Tests: Recent Labs    08/28/23 0614 08/29/23 0542  AST 28 27  ALT 29 24  ALKPHOS 101 94  BILITOT 0.7 0.7  PROT 6.9 7.0  ALBUMIN 3.1* 3.3*   No results for input(s): "LIPASE", "AMYLASE" in the last 8760 hours. No results for input(s): "AMMONIA" in the last 8760 hours. CBC: Recent Labs    07/29/23 0000 08/27/23 1404 08/28/23 0614 08/29/23 0542  WBC 7.5 5.3 9.2 6.1  NEUTROABS 4,530.00  --   --   --   HGB 12.3* 13.5 13.0 13.2  HCT 37* 40.3 38.1* 40.2  MCV  --  92.6 91.4 92.8  PLT 230 184 217 203   Cardiac Enzymes: Recent Labs    08/28/23 0614  CKTOTAL 288   BNP: Invalid input(s): "POCBNP" Lab Results  Component Value Date   HGBA1C 7.3 (H) 07/21/2023   Lab Results  Component Value Date   TSH 3.910 07/13/2019   No results found for: "VITAMINB12" No results found for:  "FOLATE" Lab Results  Component Value Date   FERRITIN 199 11/27/2018    Imaging and Procedures obtained prior to SNF admission: DG MINI C-ARM IMAGE ONLY Result Date: 08/27/2023 There is no interpretation for this exam.  This order is for images obtained during a surgical procedure.  Please See "Surgeries" Tab for more information regarding the procedure.    Assessment/Plan There are no diagnoses linked to this encounter.   Family/ staff Communication:   Labs/tests ordered:

## 2023-09-05 ENCOUNTER — Encounter: Payer: Self-pay | Admitting: Student

## 2023-09-09 LAB — BASIC METABOLIC PANEL WITH GFR
BUN: 25 — AB (ref 4–21)
CO2: 27 — AB (ref 13–22)
Chloride: 104 (ref 99–108)
Creatinine: 1.2 (ref 0.6–1.3)
Glucose: 111
Potassium: 4.6 meq/L (ref 3.5–5.1)
Sodium: 139 (ref 137–147)

## 2023-09-09 LAB — CBC: RBC: 4.03 (ref 3.87–5.11)

## 2023-09-09 LAB — POCT ERYTHROCYTE SEDIMENTATION RATE, NON-AUTOMATED: Sed Rate: 31

## 2023-09-09 LAB — CBC AND DIFFERENTIAL
HCT: 38 — AB (ref 41–53)
Hemoglobin: 12.4 — AB (ref 13.5–17.5)
Neutrophils Absolute: 3919
Platelets: 226 10*3/uL (ref 150–400)
WBC: 7.1

## 2023-09-09 LAB — COMPREHENSIVE METABOLIC PANEL WITH GFR
Calcium: 9.2 (ref 8.7–10.7)
eGFR: 60

## 2023-09-11 ENCOUNTER — Other Ambulatory Visit: Payer: Self-pay | Admitting: Student

## 2023-09-11 DIAGNOSIS — G4700 Insomnia, unspecified: Secondary | ICD-10-CM

## 2023-09-11 MED ORDER — ZOLPIDEM TARTRATE 5 MG PO TABS: 5.0000 mg | ORAL_TABLET | Freq: Every evening | ORAL | 0 refills | Status: DC | PRN

## 2023-09-11 NOTE — Progress Notes (Signed)
 Insomnia management med

## 2023-09-12 ENCOUNTER — Other Ambulatory Visit: Payer: Self-pay | Admitting: Nurse Practitioner

## 2023-09-12 DIAGNOSIS — G4700 Insomnia, unspecified: Secondary | ICD-10-CM

## 2023-09-12 MED ORDER — ZOLPIDEM TARTRATE 5 MG PO TABS: 5.0000 mg | ORAL_TABLET | Freq: Every evening | ORAL | 0 refills | Status: DC | PRN

## 2023-09-13 ENCOUNTER — Other Ambulatory Visit: Payer: Self-pay | Admitting: Student

## 2023-09-13 DIAGNOSIS — S82852A Displaced trimalleolar fracture of left lower leg, initial encounter for closed fracture: Secondary | ICD-10-CM

## 2023-09-13 MED ORDER — OXYCODONE HCL 5 MG PO TABS: 5.0000 mg | ORAL_TABLET | Freq: Two times a day (BID) | ORAL | 0 refills | Status: AC | PRN

## 2023-09-15 ENCOUNTER — Other Ambulatory Visit: Payer: Self-pay | Admitting: Student

## 2023-09-15 DIAGNOSIS — S82852A Displaced trimalleolar fracture of left lower leg, initial encounter for closed fracture: Secondary | ICD-10-CM

## 2023-09-16 ENCOUNTER — Other Ambulatory Visit: Payer: Self-pay | Admitting: Cardiology

## 2023-09-16 DIAGNOSIS — E7800 Pure hypercholesterolemia, unspecified: Secondary | ICD-10-CM

## 2023-09-16 LAB — BASIC METABOLIC PANEL WITH GFR
BUN: 22 — AB (ref 4–21)
CO2: 29 — AB (ref 13–22)
Chloride: 102 (ref 99–108)
Creatinine: 1.2 (ref 0.6–1.3)
Glucose: 88
Potassium: 4.4 meq/L (ref 3.5–5.1)
Sodium: 140 (ref 137–147)

## 2023-09-16 LAB — COMPREHENSIVE METABOLIC PANEL WITH GFR
Calcium: 8.9 (ref 8.7–10.7)
eGFR: 60

## 2023-09-16 LAB — CBC AND DIFFERENTIAL
HCT: 39 — AB (ref 41–53)
Hemoglobin: 12.4 — AB (ref 13.5–17.5)
Neutrophils Absolute: 3066
Platelets: 196 10*3/uL (ref 150–400)
WBC: 6

## 2023-09-16 LAB — POCT ERYTHROCYTE SEDIMENTATION RATE, NON-AUTOMATED: Sed Rate: 34

## 2023-09-16 LAB — CBC: RBC: 4.03 (ref 3.87–5.11)

## 2023-09-16 NOTE — Telephone Encounter (Signed)
 Unable to deny/refuse refill request, will send to Dr.Beamer to refuse

## 2023-09-19 ENCOUNTER — Ambulatory Visit: Admitting: Orthopedic Surgery

## 2023-09-24 ENCOUNTER — Ambulatory Visit (INDEPENDENT_AMBULATORY_CARE_PROVIDER_SITE_OTHER): Admitting: Orthopedic Surgery

## 2023-09-24 ENCOUNTER — Encounter: Payer: Self-pay | Admitting: Orthopedic Surgery

## 2023-09-24 DIAGNOSIS — M86462 Chronic osteomyelitis with draining sinus, left tibia and fibula: Secondary | ICD-10-CM | POA: Diagnosis not present

## 2023-09-24 LAB — BASIC METABOLIC PANEL WITH GFR
BUN: 20 (ref 4–21)
CO2: 26 — AB (ref 13–22)
Chloride: 104 (ref 99–108)
Creatinine: 1.2 (ref 0.6–1.3)
Glucose: 130
Potassium: 4.6 meq/L (ref 3.5–5.1)
Sodium: 138 (ref 137–147)

## 2023-09-24 LAB — CBC: RBC: 4.12 (ref 3.87–5.11)

## 2023-09-24 LAB — COMPREHENSIVE METABOLIC PANEL WITH GFR
Calcium: 8.8 (ref 8.7–10.7)
eGFR: 64

## 2023-09-24 LAB — CBC AND DIFFERENTIAL
HCT: 39 — AB (ref 41–53)
Hemoglobin: 12.7 — AB (ref 13.5–17.5)
Neutrophils Absolute: 3096
Platelets: 164 10*3/uL (ref 150–400)
WBC: 6

## 2023-09-24 LAB — POCT ERYTHROCYTE SEDIMENTATION RATE, NON-AUTOMATED: Sed Rate: 19

## 2023-09-24 NOTE — Progress Notes (Signed)
 Office Visit Note   Patient: Andrew Larsen           Date of Birth: [birth year]           MRN: [id number] Visit Date: 09/24/2023              Requested by: Barnetta Liberty, MD [address] [place],  [place] [address] PCP: Barnetta Liberty, MD  Chief Complaint  Patient presents with   Left Ankle - Pain    Surgery with Dr. [name] 08/27/2023      HPI: Patient is a 83 year old gentleman who is seen for initial evaluation for osteomyelitis left distal fibula status post open reduction internal fixation and status post hardware removal patient is completing a course of IV antibiotics through a PICC line.  Patient is currently at skilled nursing.  Patient states he has not been elevating his foot.  Patient is on Plavix .  No history of tobacco use he is diabetic.  Assessment & Plan: Visit Diagnoses:  1. Chronic osteomyelitis of left fibula with draining sinus (HCC)     Plan: Patient was given a bottle of Vashe to start Dial soap cleansing of the wound close with Vashe dressing changes daily with compression from the metatarsal heads to the tibial tubercle.  Discussed the importance of elevation for wound healing.  Follow-Up Instructions: Return in about 1 week (around 10/01/2023).   Ortho Exam  Patient is alert, oriented, no adenopathy, well-dressed, normal affect, normal respiratory effort. Examination there is slight dehiscence of the wound.  There is significant swelling of the left lower extremity with pitting edema in the foot.  There is no purulent drainage the wound is slightly ischemic.  Ankle-brachial indices on the left shows monophasic flow at the ankle with an ABI of 0.71 on the left with a great toe pressure of 43.  Most recent A1c is 7.3.  Imaging: No results found. No images are attached to the encounter.  Labs: Lab Results  Component Value Date   HGBA1C 7.3 (H) 07/21/2023   HGBA1C 8.4 (H) 09/02/2021   HGBA1C 7.3 (H) 11/08/2018   ESRSEDRATE 42 (H) 08/28/2023    CRP 4.8 (H) 08/28/2023   CRP <0.8 11/27/2018   CRP <0.8 11/25/2018   REPTSTATUS 08/30/2023 FINAL 08/27/2023   GRAMSTAIN NO WBC SEEN NO ORGANISMS SEEN  08/27/2023   CULT ENTEROBACTER CLOACAE (A) 08/27/2023   LABORGA ENTEROBACTER CLOACAE 08/27/2023     Lab Results  Component Value Date   ALBUMIN 3.3 (L) 08/29/2023   ALBUMIN 3.1 (L) 08/28/2023   ALBUMIN 3.5 09/01/2021    Lab Results  Component Value Date   MG 2.2 08/29/2023   MG 1.9 09/05/2021   MG 1.7 09/03/2021   No results found for: "VD25OH"  No results found for: "PREALBUMIN"    Latest Ref Rng & Units 08/29/2023    5:42 AM 08/28/2023    6:14 AM 08/27/2023    2:04 PM  CBC EXTENDED  WBC 4.0 - 10.5 K/uL 6.1  9.2  5.3   RBC 4.22 - 5.81 MIL/uL 4.33  4.17  4.35   Hemoglobin 13.0 - 17.0 g/dL 19.1  47.8  29.5   HCT 39.0 - 52.0 % 40.2  38.1  40.3   Platelets 150 - 400 K/uL 203  217  184      There is no height or weight on file to calculate BMI.  Orders:  No orders of the defined types were placed in this encounter.  No orders of the  defined types were placed in this encounter.    Procedures: No procedures performed  Clinical Data: No additional findings.  ROS:  All other systems negative, except as noted in the HPI. Review of Systems  Objective: Vital Signs: There were no vitals taken for this visit.  Specialty Comments:  No specialty comments available.  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) 08/27/2023   ILD (interstitial lung disease) (HCC) 07/26/2023   PAD (peripheral artery disease) (HCC) 07/23/2023   OSA (obstructive sleep apnea) 07/23/2023   BPH (benign prostatic hyperplasia) 07/23/2023   Trimalleolar fracture of ankle, closed, left, initial encounter 07/20/2023   Malignant neoplasm of prostate (HCC) 11/04/2022   Complicated UTI (urinary tract  infection) 09/06/2021   Cough 01/07/2019   Labile blood glucose    SOB (shortness of breath)    Urinary frequency    Hypoalbuminemia due to protein-calorie malnutrition (HCC)    Stage 3a chronic kidney disease (CKD) (HCC)    Physical debility 12/01/2018   Pressure injury of skin 11/27/2018   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   DMII (diabetes mellitus, type 2) (HCC) 11/03/2018   Essential hypertension 11/03/2018   Claudication in peripheral vascular disease (HCC) 06/10/2016   Morbid obesity (HCC) 09/21/2015   Upper airway cough syndrome 09/20/2015   Cough variant asthma 09/20/2015   Past Medical History:  Diagnosis Date   Arthritis    CHF (congestive heart failure) (HCC)    per pt's daughter   CKD (chronic kidney disease)    CKD stage III (01/2018)   Daytime somnolence    Diabetes mellitus without complication (HCC)    Fracture    right fibula/wears boot cast   GERD (gastroesophageal reflux disease)    Glaucoma    Hypercholesteremia    Hyperlipidemia    Hypertension    Inclusion body myositis    diagnosed 2020   Mold exposure    7 yrs ago   PAD (peripheral artery disease) (HCC)    Prostate cancer (HCC)    Sleep apnea    uses cpap   Uses walker    Wears glasses     Family History  Problem Relation Age of Onset   Heart disease Father    Prostate cancer Brother    Cancer Brother    Diabetes Brother    Colon cancer Neg Hx    Sleep apnea Neg Hx     Past Surgical History:  Procedure Laterality Date   ALLOGRAFT APPLICATION Left 08/27/2023   Procedure: APPLICATION, ALLOGRAFT, SKIN;  Surgeon: Saundra Curl, MD;  [facility]: [facility] OR;  Service: Orthopedics;  Laterality: Left;   APPLICATION OF WOUND VAC Left 08/27/2023   Procedure: APPLICATION, WOUND VAC;  Surgeon: Saundra Curl, MD;  Location: [facility] OR;  Service: Orthopedics;  Laterality: Left;   GOLD SEED IMPLANT N/A 01/08/2023   Procedure: GOLD SEED IMPLANT;  Surgeon: Mallie Seal, MD;  Location[facility];  Service: Urology;  Laterality: N/A;  30 MINUTES   HARDWARE REMOVAL Left 08/27/2023   Procedure: REMOVAL, HARDWARE;  Surgeon: Saundra Curl, MD;  Location: [facility] OR;  Service: Orthopedics;  Laterality: Left;   INCISION AND DRAINAGE OF DEEP ABSCESS, ANKLE Left 08/27/2023   Procedure: INCISION AND DRAINAGE OF DEEP ABSCESS, ANKLE;  Surgeon: Saundra Curl, MD;  Location: [facility] OR;  Service: Orthopedics;  Laterality: Left;  LOWER EXTREMITY ANGIOGRAPHY N/A 06/12/2016   Procedure: Lower Extremity Angiography;  Surgeon: Knox Perl, MD;  Location: [facility] INVASIVE CV LAB;  Service: Cardiovascular;  Laterality: N/A;   MUSCLE BIOPSY Right 05/06/2018   Procedure: right vastus lateralis muscle biopsy;  Surgeon: Agustina Aldrich, MD;  Location: [facility] OR;  Service: Neurosurgery;  Laterality: Right;   ORIF ANKLE FRACTURE Right 10/02/2012   Procedure: OPEN REDUCTION INTERNAL FIXATION (ORIF) ANKLE FRACTURE;  Surgeon: Jasmine Mesi, MD;  Location: [facility]S;  Service: Orthopedics;  Laterality: Right;   ORIF ANKLE FRACTURE Left 07/21/2023   Procedure: OPEN REDUCTION INTERNAL FIXATION (ORIF) ANKLE FRACTURE;  Surgeon: Saundra Curl, MD;  Location: [facility] ORS;  Service: Orthopedics;  Laterality: Left;   PERIPHERAL VASCULAR ATHERECTOMY Left 06/12/2016   Procedure: Peripheral Vascular Atherectomy-Left Popliteal;  Surgeon: Knox Perl, MD;  Location: [facility] INVASIVE CV LAB;  Service: Cardiovascular;  Laterality: Left;   PERIPHERAL VASCULAR INTERVENTION Left 06/12/2016   Procedure: Peripheral Vascular Intervention- DCB Left Popliteal;  Surgeon: Knox Perl, MD;  Location: [facility] INVASIVE CV LAB;  Service: Cardiovascular;  Laterality: Left;   PROSTATE BIOPSY     ROTATION FLAP Left 08/27/2023   Procedure: CREATION, FLAP, ROTATION;  Surgeon: Saundra Curl, MD;  Location: [facility];  Service: Orthopedics;  Laterality: Left;   SPACE OAR INSTILLATION N/A 01/08/2023   Procedure: SPACE OAR INSTILLATION;  Surgeon: Mallie Seal, MD;   Location: [facility];  Service: Urology;  Laterality: N/A;   Social History   Occupational History   Not on file  Tobacco Use   Smoking status: Former    Current packs/day: 0.00    Average packs/day: 1 pack/day for 20.0 years (20.0 ttl pk-yrs)    Types: Cigarettes    Start date: 08/22/1961    Quit date: 08/22/1981    Years since quitting: 42.1   Smokeless tobacco: Never  Vaping Use   Vaping status: Never Used  Substance and Sexual Activity   Alcohol  use: No    Alcohol /week: 0.0 standard drinks of alcohol    Drug use: No   Sexual activity: Never

## 2023-09-25 ENCOUNTER — Encounter: Payer: Self-pay | Admitting: Student

## 2023-09-25 NOTE — Progress Notes (Signed)
 Error

## 2023-09-26 ENCOUNTER — Telehealth: Payer: Self-pay

## 2023-09-26 ENCOUNTER — Ambulatory Visit (INDEPENDENT_AMBULATORY_CARE_PROVIDER_SITE_OTHER): Admitting: Internal Medicine

## 2023-09-26 ENCOUNTER — Encounter: Payer: Self-pay | Admitting: Internal Medicine

## 2023-09-26 ENCOUNTER — Other Ambulatory Visit: Payer: Self-pay

## 2023-09-26 VITALS — BP 152/75 | HR 68 | Resp 16 | Ht 71.0 in | Wt 266.6 lb

## 2023-09-26 DIAGNOSIS — M86172 Other acute osteomyelitis, left ankle and foot: Secondary | ICD-10-CM | POA: Diagnosis not present

## 2023-09-26 DIAGNOSIS — B9689 Other specified bacterial agents as the cause of diseases classified elsewhere: Secondary | ICD-10-CM | POA: Diagnosis not present

## 2023-09-26 DIAGNOSIS — M86272 Subacute osteomyelitis, left ankle and foot: Secondary | ICD-10-CM

## 2023-09-26 DIAGNOSIS — R7881 Bacteremia: Secondary | ICD-10-CM | POA: Diagnosis not present

## 2023-09-26 NOTE — Telephone Encounter (Signed)
 Advised Andrew Larsen via voicemail at [facility] per Dr Levern Reader: Continue Cefepime  2 gm IV Q12 H renally adjust with CR function changes until 10/09/23.  Check weekly CBC,CMP,CRP,ESR. STOP IV ABX 10/09/23 PULL PICC START oral Levofloxacin 750 mg daily begin 10/10/23. Follow up 10/30/23 230 pm. Updated [facility] for Opat Tracking.

## 2023-09-26 NOTE — Progress Notes (Signed)
 Patient ID: Andrew Larsen, male   DOB: 1940/06/04, 83 y.o.   MRN: [id number]  HPI Mr fonder is a 83yo M who has hx of left ankle fracture s/p HW placement, had continued drainage from incision. He was hospitalized for enterobacter bacteremia and also underwent I x D  and hardware removal on 4/30 by dr [name] -- of left ankle, with presumption of early osteomyelitis and he was discharged on iv cefepime  through 10/09/23. He has been following up with dr. Julio Ohm but it was noted last week that he had slight dehiscence of the wound.  There is significant swelling of the left lower extremity with pitting edema in the foot.  There is no purulent drainage the wound is slightly ischemic.   Ankle-brachial indices on the left shows monophasic flow at the ankle with an ABI of 0.71 on the left with a great toe pressure of 43.  Most recent A1c is 7.3.  He denies drainage from his left ankle, no pain, erythema, just lower pedal edema  Outpatient Encounter Medications as of 09/26/2023  Medication Sig   aspirin  EC 81 MG tablet Take 1 tablet (81 mg total) by mouth daily. Swallow whole.   ceFEPime  (MAXIPIME ) IVPB Inject 2 g into the vein every 12 (twelve) hours. Indication:  E cloacae osteomyelitis and bacteremia First Dose: Yes Last Day of Therapy:  10/09/23 Labs - Once weekly:  CBC/D and BMP, Labs - Once weekly: ESR and CRP   clopidogrel  (PLAVIX ) 75 MG tablet Take 1 tablet (75 mg total) by mouth daily.   Continuous Blood Gluc Sensor (FREESTYLE LIBRE 2 SENSOR) MISC Inject 1 Device into the skin every 14 (fourteen) days.   docusate sodium  (COLACE) 100 MG capsule Take 1 capsule (100 mg total) by mouth 2 (two) times daily as needed for mild constipation.   dorzolamide  (TRUSOPT ) 2 % ophthalmic solution Place 1 drop into the right eye 2 (two) times daily.   ezetimibe  (ZETIA ) 10 MG tablet TAKE 1 TABLET(10 MG) BY MOUTH DAILY AFTER SUPPER   furosemide  (LASIX ) 40 MG tablet Take 1 tablet (40 mg total) by mouth daily.    gabapentin  (NEURONTIN ) 300 MG capsule Take 2 capsules (600 mg total) by mouth 3 (three) times daily.   heparin  5000 UNIT/ML injection Use 3cc intravenously every 12 hours for PICC line   latanoprost  (XALATAN ) 0.005 % ophthalmic solution Place 1 drop into both eyes at bedtime.   losartan  (COZAAR ) 25 MG tablet TAKE 1 TABLET(25 MG) BY MOUTH DAILY   melatonin 5 MG TABS Take 5 mg by mouth at bedtime.   metoprolol  tartrate (LOPRESSOR ) 25 MG tablet Take 1 tablet (25 mg total) by mouth 2 (two) times daily.   Multiple Vitamin (MULTIVITAMIN WITH MINERALS) TABS tablet Take 1 tablet by mouth daily with breakfast.   NOVOLIN 70/30 (70-30) 100 UNIT/ML injection Inject 50 Units into the skin in the morning and at bedtime.   Oxycodone  HCl 10 MG TABS Take 1 tablet (10 mg total) by mouth 2 (two) times daily for 14 days. (Patient taking differently: Take 10 mg by mouth 2 (two) times daily. Take 5mg  by mouth every 24 hours as needed.)   polyethylene glycol (MIRALAX  / GLYCOLAX ) 17 g packet Take 17 g by mouth daily as needed.   PRESCRIPTION MEDICATION CPAP- At bedtime   rosuvastatin  (CRESTOR ) 10 MG tablet Take 10 mg by mouth daily.   senna (SENOKOT) 8.6 MG TABS tablet Take 2 tablets by mouth at bedtime.   tamsulosin  (FLOMAX )  0.4 MG CAPS capsule Take 0.4 mg by mouth daily.   timolol  (TIMOPTIC ) 0.5 % ophthalmic solution Place 1 drop into the right eye 2 (two) times daily.   tirzepatide (MOUNJARO) 2.5 MG/0.5ML Pen    zolpidem  (AMBIEN ) 5 MG tablet Take 1 tablet (5 mg total) by mouth at bedtime as needed for sleep.   No facility-administered encounter medications on file as of 09/26/2023.     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) 08/27/2023   ILD (interstitial lung disease) (HCC) 07/26/2023   PAD (peripheral artery disease) (HCC) 07/23/2023   OSA (obstructive sleep apnea) 07/23/2023    BPH (benign prostatic hyperplasia) 07/23/2023   Trimalleolar fracture of ankle, closed, left, initial encounter 07/20/2023   Malignant neoplasm of prostate (HCC) 11/04/2022   Complicated UTI (urinary tract infection) 09/06/2021   Cough 01/07/2019   Labile blood glucose    SOB (shortness of breath)    Urinary frequency    Hypoalbuminemia due to protein-calorie malnutrition (HCC)    Stage 3a chronic kidney disease (CKD) (HCC)    Physical debility 12/01/2018   Pressure injury of skin 11/27/2018   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   DMII (diabetes mellitus, type 2) (HCC) 11/03/2018   Essential hypertension 11/03/2018   Claudication in peripheral vascular disease (HCC) 06/10/2016   Morbid obesity (HCC) 09/21/2015   Upper airway cough syndrome 09/20/2015   Cough variant asthma 09/20/2015     Health Maintenance Due  Topic Date Due   FOOT EXAM  Never done   OPHTHALMOLOGY EXAM  Never done   Diabetic kidney evaluation - Urine ACR  Never done     Review of Systems 12 point ros is negative Physical Exam   BP (!) 152/75   Pulse 68   Resp 16   Ht 5\' 11"  (1.803 m)   Wt 266 lb 9.6 oz (120.9 kg)   SpO2 98%   BMI 37.18 kg/m    Physical Exam  Constitutional: He is oriented to person, place, and time. He appears well-developed and well-nourished. No distress.  HENT:  Mouth/Throat: Oropharynx is clear and moist. No oropharyngeal exudate.  Cardiovascular: Normal rate, regular rhythm and normal heart sounds. Exam reveals no gallop and no friction rub.  No murmur heard.  Pulmonary/Chest: Effort normal and breath sounds normal. No respiratory distress. He has no wheezes.  Abdominal: Soft. Bowel sounds are normal. He exhibits no distension. There is no tenderness.  Ext: left ankle small eschar in wound bed, distal incision. See picture in chart Skin: Skin is warm and dry. No rash noted. No erythema.  Psychiatric: He has a normal mood and affect. His behavior is normal.    CBC Lab  Results  Component Value Date   WBC 6.0 09/24/2023   RBC 4.12 09/24/2023   HGB 12.7 (A) 09/24/2023   HCT 39 (A) 09/24/2023   PLT 164 09/24/2023   MCV 92.8 08/29/2023   MCH 30.5 08/29/2023   MCHC 32.8 08/29/2023   RDW 13.8 08/29/2023   LYMPHSABS 1.6 09/06/2021   MONOABS 1.1 (H) 09/06/2021   EOSABS 0.2 09/06/2021    BMET Lab Results  Component Value Date   NA 138 09/24/2023   K 4.6 09/24/2023   CL 104 09/24/2023   CO2 26 (A) 09/24/2023   GLUCOSE 111 (H) 08/29/2023   BUN 20 09/24/2023   CREATININE 1.2 09/24/2023   CALCIUM  8.8 09/24/2023  GFRNONAA 56 (L) 08/29/2023   GFRAA 49 (L) 02/15/2020    Enterobacter cloacae      MIC    CEFEPIME  <=0.12 SENS... Sensitive    CEFTAZIDIME <=1 SENSITIVE Sensitive    CIPROFLOXACIN <=0.25 SENS... Sensitive    GENTAMICIN <=1 SENSITIVE Sensitive    IMIPENEM <=0.25 SENS... Sensitive    PIP/TAZO <=4 SENSITI... Sensitive    TRIMETH/SULFA <=20 SENSIT... Sensitive    Lab Results  Component Value Date   ESRSEDRATE 19 09/24/2023   Lab Results  Component Value Date   CRP 4.8 (H) 08/28/2023   Lab Results  Component Value Date   CREATININE 1.2 09/24/2023      Assessment and Plan Enterobacter ankle osteomyelitis = Continue on cefepime  2gm q 12hr through 6/11, weekly cbc, cmp, sed rate and crp. Small dehiscence continue with vashe daily wash and daily wrapping We will then will pull picc line and convert to oral levofloxacin 750mg  daily through 10/16/23 to complete course  Follow up with greg or myself at end of June  I have personally spent 35 minutes involved in face-to-face and non-face-to-face activities for this patient on the day of the visit. Professional time spent includes the following activities: Preparing to see the patient (review of tests), Obtaining and/or reviewing separately obtained history (admission/discharge record), Performing a medically appropriate examination and/or evaluation , Ordering  medications/tests/procedures, referring and communicating with other health care professionals, Documenting clinical information in the EMR, Independently interpreting results (not separately reported), Communicating results to the patient and daughter, Counseling and educating the patient and daughter.

## 2023-09-28 MED ORDER — LEVOFLOXACIN 750 MG PO TABS: 750.0000 mg | ORAL_TABLET | Freq: Every day | ORAL | 0 refills | Status: DC

## 2023-09-30 ENCOUNTER — Non-Acute Institutional Stay (SKILLED_NURSING_FACILITY): Payer: Self-pay | Admitting: Student

## 2023-09-30 ENCOUNTER — Encounter: Payer: Self-pay | Admitting: Student

## 2023-09-30 DIAGNOSIS — I10 Essential (primary) hypertension: Secondary | ICD-10-CM

## 2023-09-30 DIAGNOSIS — R7881 Bacteremia: Secondary | ICD-10-CM

## 2023-09-30 DIAGNOSIS — N1831 Chronic kidney disease, stage 3a: Secondary | ICD-10-CM | POA: Diagnosis not present

## 2023-09-30 DIAGNOSIS — B9689 Other specified bacterial agents as the cause of diseases classified elsewhere: Secondary | ICD-10-CM

## 2023-09-30 DIAGNOSIS — Z96662 Presence of left artificial ankle joint: Secondary | ICD-10-CM

## 2023-09-30 DIAGNOSIS — T8459XA Infection and inflammatory reaction due to other internal joint prosthesis, initial encounter: Secondary | ICD-10-CM

## 2023-09-30 DIAGNOSIS — I739 Peripheral vascular disease, unspecified: Secondary | ICD-10-CM | POA: Diagnosis not present

## 2023-09-30 DIAGNOSIS — E1122 Type 2 diabetes mellitus with diabetic chronic kidney disease: Secondary | ICD-10-CM | POA: Diagnosis not present

## 2023-09-30 NOTE — Progress Notes (Signed)
 Location:  Other ([facility] Room Number: [facility] - SNF - [address] Place of Service:  SNF [phone]) Provider:  Harvie Liner, MD  Patient Care Team: Barnetta Liberty, MD as PCP - General (Internal Medicine) Barnetta Liberty, MD (Internal Medicine) Katheleen Palmer, RN as Oncology Nurse Navigator  Extended Emergency Contact Information Primary Emergency Contact: [facility] Address: [address]          [place], [place] [address] [place] Home Phone: [phone] Mobile Phone: [phone] Relation: Daughter  Code Status:  DNR Goals of care: Advanced Directive information    09/25/2023   10:11 AM  Advanced Directives  Does Patient Have a Medical Advance Directive? Yes  Type of Estate agent of [place];Living will;Out of facility DNR (pink MOST or yellow form)  Does patient want to make changes to medical advance directive? No - Patient declined  Copy of Healthcare Power of Attorney in Chart? Yes - validated most recent copy scanned in chart (See row information)     Chief Complaint  Patient presents with   Medical Management of Chronic Issues    HPI:  Pt is a 83 y.o. male seen today for an routine visit.  Discussed the use of AI scribe software for clinical note transcription with the patient, who gave verbal consent to proceed.  History of Present Illness   History of Present Illness The patient is an 83 year old who presents for follow-up after leg surgery.  He feels anxious due to a mix-up regarding the cleaning schedule of his leg wound. He recalls being instructed to clean the wound daily with a special solution, but it was not done the previous night. He is unsure about the exact schedule, attributing this to the effects of multiple medications on his memory.  The leg dressing applied at the office was too tight, causing discomfort, but he is relieved that the swelling has decreased. He  notes some drainage from the wound. He experiences occasional pain in the leg but tries to avoid taking pain medication due to concerns about confusion. He is pleased with the way his leg looks now. No significant pain currently, although it occurs occasionally.    Past Medical History:  Diagnosis Date   Arthritis    CHF (congestive heart failure) (HCC)    per pt's daughter   CKD (chronic kidney disease)    CKD stage III (01/2018)   Daytime somnolence    Diabetes mellitus without complication (HCC)    Fracture    right fibula/wears boot cast   GERD (gastroesophageal reflux disease)    Glaucoma    Hypercholesteremia    Hyperlipidemia    Hypertension    Inclusion body myositis    diagnosed 2020   Mold exposure    7 yrs ago   PAD (peripheral artery disease) (HCC)    Prostate cancer (HCC)    Sleep apnea    uses cpap   Uses walker    Wears glasses    Past Surgical History:  Procedure Laterality Date   ALLOGRAFT APPLICATION Left 08/27/2023   Procedure: APPLICATION, ALLOGRAFT, SKIN;  Surgeon: Saundra Curl, MD;  Location: [facility] OR;  Service: Orthopedics;  Laterality: Left;   APPLICATION OF WOUND VAC Left 08/27/2023   Procedure: APPLICATION, WOUND VAC;  Surgeon: Saundra Curl, MD;  Location: [facility] OR;  Service: Orthopedics;  Laterality: Left;   GOLD SEED IMPLANT N/A 01/08/2023   Procedure: GOLD SEED IMPLANT;  Surgeon:  Mallie Seal, MD;  Location: [facility];  Service: Urology;  Laterality: N/A;  30 MINUTES   HARDWARE REMOVAL Left 08/27/2023   Procedure: REMOVAL, HARDWARE;  Surgeon: Saundra Curl, MD;  Location: [facility] OR;  Service: Orthopedics;  Laterality: Left;   INCISION AND DRAINAGE OF DEEP ABSCESS, ANKLE Left 08/27/2023   Procedure: INCISION AND DRAINAGE OF DEEP ABSCESS, ANKLE;  Surgeon: Saundra Curl, MD;  Location: [facility] OR;  Service: Orthopedics;  Laterality: Left;   LOWER EXTREMITY ANGIOGRAPHY N/A 06/12/2016   Procedure: Lower Extremity Angiography;   Surgeon: Knox Perl, MD;  Location: [facility] INVASIVE CV LAB;  Service: Cardiovascular;  Laterality: N/A;   MUSCLE BIOPSY Right 05/06/2018   Procedure: right vastus lateralis muscle biopsy;  Surgeon: Agustina Aldrich, MD;  Location: [facility] OR;  Service: Neurosurgery;  Laterality: Right;   ORIF ANKLE FRACTURE Right 10/02/2012   Procedure: OPEN REDUCTION INTERNAL FIXATION (ORIF) ANKLE FRACTURE;  Surgeon: Jasmine Mesi, MD;  Location: [facility] ORS;  Service: Orthopedics;  Laterality: Right;   ORIF ANKLE FRACTURE Left 07/21/2023   Procedure: OPEN REDUCTION INTERNAL FIXATION (ORIF) ANKLE FRACTURE;  Surgeon: Saundra Curl, MD;  Location: [facility] ORS;  Service: Orthopedics;  Laterality: Left;   PERIPHERAL VASCULAR ATHERECTOMY Left 06/12/2016   Procedure: Peripheral Vascular Atherectomy-Left Popliteal;  Surgeon: Knox Perl, MD;  Location: [facility] INVASIVE CV LAB;  Service: Cardiovascular;  Laterality: Left;   PERIPHERAL VASCULAR INTERVENTION Left 06/12/2016   Procedure: Peripheral Vascular Intervention- DCB Left Popliteal;  Surgeon: Knox Perl, MD;  Location: [facility] INVASIVE CV LAB;  Service: Cardiovascular;  Laterality: Left;   PROSTATE BIOPSY     ROTATION FLAP Left 08/27/2023   Procedure: CREATION, FLAP, ROTATION;  Surgeon: Saundra Curl, MD;  Location: [facility] OR;  Service: Orthopedics;  Laterality: Left;   SPACE OAR INSTILLATION N/A 01/08/2023   Procedure: SPACE OAR INSTILLATION;  Surgeon: Mallie Seal, MD;  Location: [facility];  Service: Urology;  Laterality: N/A;    Allergies  Allergen Reactions   Metformin And Related Shortness Of Breath   Pollen Extract Itching and Other (See Comments)    Some wheezing, itchy eyes, runny nose   Simbrinza [Brinzolamide-Brimonidine ] Other (See Comments)    Drainage, redness to eyes    Outpatient Encounter Medications as of 09/30/2023  Medication Sig   aspirin  EC 81 MG tablet Take 1 tablet (81 mg total) by mouth daily. Swallow whole.   ceFEPime  (MAXIPIME ) IVPB Inject 2  g into the vein every 12 (twelve) hours. Indication:  E cloacae osteomyelitis and bacteremia First Dose: Yes Last Day of Therapy:  10/09/23 Labs - Once weekly:  CBC/D and BMP, Labs - Once weekly: ESR and CRP   clopidogrel  (PLAVIX ) 75 MG tablet Take 1 tablet (75 mg total) by mouth daily.   Continuous Blood Gluc Sensor (FREESTYLE LIBRE 2 SENSOR) MISC Inject 1 Device into the skin every 14 (fourteen) days.   docusate sodium  (COLACE) 100 MG capsule Take 1 capsule (100 mg total) by mouth 2 (two) times daily as needed for mild constipation.   dorzolamide  (TRUSOPT ) 2 % ophthalmic solution Place 1 drop into the right eye 2 (two) times daily.   ezetimibe  (ZETIA ) 10 MG tablet TAKE 1 TABLET(10 MG) BY MOUTH DAILY AFTER SUPPER   furosemide  (LASIX ) 40 MG tablet Take 1 tablet (40 mg total) by mouth daily.   gabapentin  (NEURONTIN ) 300 MG capsule Take 2 capsules (600 mg total) by mouth 3 (three) times daily.   heparin  5000 UNIT/ML injection  Use 3cc intravenously every 12 hours for PICC line   latanoprost  (XALATAN ) 0.005 % ophthalmic solution Place 1 drop into both eyes at bedtime.   levofloxacin (LEVAQUIN) 750 MG tablet Take 1 tablet (750 mg total) by mouth daily. To start on 6/12   losartan  (COZAAR ) 25 MG tablet TAKE 1 TABLET(25 MG) BY MOUTH DAILY   melatonin 5 MG TABS Take 5 mg by mouth at bedtime.   metoprolol  tartrate (LOPRESSOR ) 25 MG tablet Take 1 tablet (25 mg total) by mouth 2 (two) times daily.   Multiple Vitamin (MULTIVITAMIN WITH MINERALS) TABS tablet Take 1 tablet by mouth daily with breakfast.   NOVOLIN 70/30 (70-30) 100 UNIT/ML injection Inject 50 Units into the skin in the morning and at bedtime.   Oxycodone  HCl 10 MG TABS Take 1 tablet (10 mg total) by mouth 2 (two) times daily for 14 days. (Patient taking differently: Take 10 mg by mouth 2 (two) times daily. Take 5mg  by mouth every 24 hours as needed.)   polyethylene glycol (MIRALAX  / GLYCOLAX ) 17 g packet Take 17 g by mouth daily as needed.    PRESCRIPTION MEDICATION CPAP- At bedtime   rosuvastatin  (CRESTOR ) 10 MG tablet Take 10 mg by mouth daily.   senna (SENOKOT) 8.6 MG TABS tablet Take 2 tablets by mouth at bedtime.   tamsulosin  (FLOMAX ) 0.4 MG CAPS capsule Take 0.4 mg by mouth daily.   timolol  (TIMOPTIC ) 0.5 % ophthalmic solution Place 1 drop into the right eye 2 (two) times daily.   tirzepatide (MOUNJARO) 2.5 MG/0.5ML Pen    zolpidem  (AMBIEN ) 5 MG tablet Take 1 tablet (5 mg total) by mouth at bedtime as needed for sleep.   No facility-administered encounter medications on file as of 09/30/2023.    Review of Systems  Immunization History  Administered Date(s) Administered   Influenza Split 04/30/2012, 03/19/2013, 03/10/2014, 02/07/2015   Influenza, High Dose Seasonal PF 01/29/2017, 02/16/2023   Influenza, Quadrivalent, Recombinant, Inj, Pf 02/03/2018, 03/05/2019, 02/24/2020, 02/28/2021   Influenza,inj,Quad PF,6+ Mos 03/10/2014, 01/11/2015   Influenza,trivalent, recombinat, inj, PF 03/19/2013   Influenza-Unspecified 02/03/2018   Novavax(Covid-19) Vaccine 04/18/2023   PFIZER Comirnaty(Gray Top)Covid-19 Tri-Sucrose Vaccine 03/18/2020   PFIZER(Purple Top)SARS-COV-2 Vaccination 05/28/2019, 06/19/2019, 11/19/2020   PNEUMOCOCCAL CONJUGATE-20 03/19/2023   Pfizer Covid-19 Vaccine Bivalent Booster 61yrs & up 03/13/2021   Pfizer(Comirnaty)Fall Seasonal Vaccine 12 years and older 01/16/2022   Pneumococcal Conjugate-13 12/10/2013   Pneumococcal Polysaccharide-23 05/01/2011   Tdap 04/30/2008, 08/10/2022   Zoster Recombinant(Shingrix) 01/16/2022, 03/19/2022   Zoster, Live 12/10/2013   Pertinent  Health Maintenance Due  Topic Date Due   FOOT EXAM  Never done   OPHTHALMOLOGY EXAM  Never done   INFLUENZA VACCINE  11/29/2023   HEMOGLOBIN A1C  01/21/2024      09/06/2021    8:37 AM 09/06/2021    8:00 PM 09/07/2021    9:03 AM 09/26/2023    2:21 PM 09/26/2023    2:40 PM  Fall Risk  Falls in the past year?    1 1  Was there an injury  with Fall?    1 1  Fall Risk Category Calculator    2 3  (RETIRED) Patient Fall Risk Level High fall risk High fall risk High fall risk     Functional Status Survey:    There were no vitals filed for this visit. There is no height or weight on file to calculate BMI. Physical Exam Cardiovascular:     Pulses: Normal pulses.  Skin:    Comments:  Left lateral malleolus wound with small amount of maceration at the distal apex of the wound. No drainage. Granulation tissue at the base of the wound.   Neurological:     General: No focal deficit present.     Mental Status: He is alert and oriented to person, place, and time. Mental status is at baseline.     Labs reviewed: Recent Labs    07/23/23 0447 07/29/23 0000 08/28/23 0614 08/29/23 0542 09/09/23 0000 09/16/23 0000 09/24/23 0000  NA 134*   < > 133* 135 139 140 138  K 4.6   < > 4.5 4.4 4.6 4.4 4.6  CL 101   < > 99 100 104 102 104  CO2 27   < > 24 23 27* 29* 26*  GLUCOSE 135*  --  151* 111*  --   --   --   BUN 28*   < > 23 26* 25* 22* 20  CREATININE 1.24   < > 1.24 1.30* 1.2 1.2 1.2  CALCIUM  8.3*   < > 9.0 9.0 9.2 8.9 8.8  MG  --   --   --  2.2  --   --   --    < > = values in this interval not displayed.   Recent Labs    08/28/23 0614 08/29/23 0542  AST 28 27  ALT 29 24  ALKPHOS 101 94  BILITOT 0.7 0.7  PROT 6.9 7.0  ALBUMIN 3.1* 3.3*   Recent Labs    08/27/23 1404 08/28/23 0614 08/29/23 0542 09/09/23 0000 09/16/23 0000 09/24/23 0000  WBC 5.3 9.2 6.1 7.1 6.0 6.0  NEUTROABS  --   --   --  3,919.00 3,066.00 3,096.00  HGB 13.5 13.0 13.2 12.4* 12.4* 12.7*  HCT 40.3 38.1* 40.2 38* 39* 39*  MCV 92.6 91.4 92.8  --   --   --   PLT 184 217 203 226 196 164   Lab Results  Component Value Date   TSH 3.910 07/13/2019   Lab Results  Component Value Date   HGBA1C 7.3 (H) 07/21/2023   Lab Results  Component Value Date   CHOL 157 02/15/2020   HDL 49 02/15/2020   LDLCALC 95 02/15/2020   TRIG 67 02/15/2020    CHOLHDL 2.7 11/08/2018    Significant Diagnostic Results in last 30 days:  Results Procedure: Wound dressing change Description: The wound dressing was removed. The wound was moistened to facilitate easier removal. The wound was observed to have reduced edema and minimal exudate. The wound was rewrapped with a new dressing, and the name and date were written on the dressing.  Assessment/Plan Post-surgical wound care  Bacteremia  PAD The post-surgical wound on the leg is healing slowly with decreased swelling and some drainage. A missed dressing change occurred due to a scheduling mix-up. The wound is being cleaned with a special solution, and leg elevation is practiced to aid healing. He is pleased with the progress and actively participates in the care plan. - Continue daily wound cleaning and dressing changes. - Ensure proper documentation of dressing changes to avoid future mix-ups. - Encourage leg elevation to reduce swelling. - Monitor for signs of infection or complications.  Diabetes Well controlled on current regimen. Appears euglycemic at this time.  BP Well controlled at this time.   Anxiety Experiencing anxiety, possibly related to the healing process and the mix-up in wound care. He expresses concern about memory and confusion, potentially exacerbated by medication use, and is trying  to minimize medication use due to these concerns. - Discuss concerns about medication-related confusion with the healthcare team. - Provide reassurance and support regarding the healing process. - Consider non-pharmacological interventions for anxiety management.  Family/ staff Communication: nursing  Labs/tests ordered:  none

## 2023-09-30 NOTE — Progress Notes (Signed)
 This encounter was created in error - please disregard.

## 2023-10-01 ENCOUNTER — Ambulatory Visit: Admitting: Orthopedic Surgery

## 2023-10-07 ENCOUNTER — Ambulatory Visit: Admitting: Family

## 2023-10-07 ENCOUNTER — Encounter: Payer: Self-pay | Admitting: Family

## 2023-10-07 DIAGNOSIS — T8131XS Disruption of external operation (surgical) wound, not elsewhere classified, sequela: Secondary | ICD-10-CM | POA: Diagnosis not present

## 2023-10-07 DIAGNOSIS — M86462 Chronic osteomyelitis with draining sinus, left tibia and fibula: Secondary | ICD-10-CM | POA: Diagnosis not present

## 2023-10-07 LAB — BASIC METABOLIC PANEL WITH GFR
BUN: 24 — AB (ref 4–21)
CO2: 24 — AB (ref 13–22)
Chloride: 103 (ref 99–108)
Creatinine: 1.2 (ref 0.6–1.3)
Glucose: 68
Potassium: 4.6 meq/L (ref 3.5–5.1)
Sodium: 138 (ref 137–147)

## 2023-10-07 LAB — COMPREHENSIVE METABOLIC PANEL WITH GFR
Calcium: 8.8 (ref 8.7–10.7)
eGFR: 60

## 2023-10-07 LAB — CBC AND DIFFERENTIAL
HCT: 41 (ref 41–53)
Hemoglobin: 13.1 — AB (ref 13.5–17.5)
Neutrophils Absolute: 3204
Platelets: 166 10*3/uL (ref 150–400)
WBC: 6

## 2023-10-07 LAB — CBC: RBC: 4.27 (ref 3.87–5.11)

## 2023-10-07 LAB — POCT ERYTHROCYTE SEDIMENTATION RATE, NON-AUTOMATED: Sed Rate: 22

## 2023-10-07 NOTE — Progress Notes (Signed)
 Office Visit Note   Patient: Andrew Larsen           Date of Birth: Apr 18, 1941           MRN: [id number] Visit Date: 10/07/2023              Requested by: Barnetta Liberty, MD [address] [place],  [place] [address] PCP: Barnetta Liberty, MD  Chief Complaint  Patient presents with   Left Ankle - Follow-up    Surgery with Dr. [name] 08/27/2023      HPI: Patient is a 83 year old gentleman who is seen in follow-up for osteomyelitis left distal fibula status post open reduction internal fixation and status post hardware removal patient is completing a course of IV antibiotics through a PICC line.    Patient is currently at skilled nursing.  Patient is on Plavix .  No history of tobacco use he is diabetic.  Has been cleansing with Vashe.  Having Vashe to dry dressing changes placed.  Assessment & Plan: Visit Diagnoses:  No diagnosis found.   Plan: Patient was given a bottle of Vashe to start Dial soap cleansing of the wound. Vashe dressing changes daily with compression from the metatarsal heads to the tibial tubercle.  Discussed the importance of elevation for wound healing.  Follow-Up Instructions: No follow-ups on file.   Ortho Exam  Patient is alert, oriented, no adenopathy, well-dressed, normal affect, normal respiratory effort. Examination there is slight dehiscence of the incision.  There is improved swelling of the left lower extremity with wrinkling of the skin from compression.  There is scant necrotic tissue in the wound bed this is was debrided with 4 x 4's after harvesting all with 3 sutures.  Packed open with a Vashe soaked dressing  Area of dehiscence is 2 and half centimeters by 5 mm x 5 mm depth  Ankle-brachial indices on the left shows monophasic flow at the ankle with an ABI of 0.71 on the left with a great toe pressure of 43.  Most recent A1c is 7.3.  Imaging: No results found. No images are attached to the encounter.  Labs: Lab Results  Component  Value Date   HGBA1C 7.3 (H) 07/21/2023   HGBA1C 8.4 (H) 09/02/2021   HGBA1C 7.3 (H) 11/08/2018   ESRSEDRATE 19 09/24/2023   ESRSEDRATE 34 09/16/2023   ESRSEDRATE 31 09/09/2023   CRP 4.8 (H) 08/28/2023   CRP <0.8 11/27/2018   CRP <0.8 11/25/2018   REPTSTATUS 08/30/2023 FINAL 08/27/2023   GRAMSTAIN NO WBC SEEN NO ORGANISMS SEEN  08/27/2023   CULT ENTEROBACTER CLOACAE (A) 08/27/2023   LABORGA ENTEROBACTER CLOACAE 08/27/2023     Lab Results  Component Value Date   ALBUMIN 3.3 (L) 08/29/2023   ALBUMIN 3.1 (L) 08/28/2023   ALBUMIN 3.5 09/01/2021    Lab Results  Component Value Date   MG 2.2 08/29/2023   MG 1.9 09/05/2021   MG 1.7 09/03/2021   No results found for: "VD25OH"  No results found for: "PREALBUMIN"    Latest Ref Rng & Units 09/24/2023   12:00 AM 09/16/2023   12:00 AM 09/09/2023   12:00 AM  CBC EXTENDED  WBC  6.0     6.0     7.1      RBC 3.87 - 5.11 4.12     4.03     4.03      Hemoglobin 13.5 - 17.5 12.7     12.4     12.4  HCT 41 - 53 39     39     38      Platelets 150 - 400 K/uL 164     196     226      NEUT#  3,096.00     3,066.00     3,919.00         This result is from an external source.     There is no height or weight on file to calculate BMI.  Orders:  No orders of the defined types were placed in this encounter.  No orders of the defined types were placed in this encounter.    Procedures: No procedures performed  Clinical Data: No additional findings.  ROS:  All other systems negative, except as noted in the HPI. Review of Systems  Objective: Vital Signs: There were no vitals taken for this visit.  Specialty Comments:  No specialty comments available.  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) 08/27/2023   ILD (interstitial lung disease) (HCC) 07/26/2023   PAD (peripheral artery  disease) (HCC) 07/23/2023   OSA (obstructive sleep apnea) 07/23/2023   BPH (benign prostatic hyperplasia) 07/23/2023   Trimalleolar fracture of ankle, closed, left, initial encounter 07/20/2023   Malignant neoplasm of prostate (HCC) 11/04/2022   Complicated UTI (urinary tract infection) 09/06/2021   Cough 01/07/2019   Labile blood glucose    SOB (shortness of breath)    Urinary frequency    Hypoalbuminemia due to protein-calorie malnutrition (HCC)    Stage 3a chronic kidney disease (CKD) (HCC)    Physical debility 12/01/2018   Pressure injury of skin 11/27/2018   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   DMII (diabetes mellitus, type 2) (HCC) 11/03/2018   Essential hypertension 11/03/2018   Claudication in peripheral vascular disease (HCC) 06/10/2016   Morbid obesity (HCC) 09/21/2015   Upper airway cough syndrome 09/20/2015   Cough variant asthma 09/20/2015   Past Medical History:  Diagnosis Date   Arthritis    CHF (congestive heart failure) (HCC)    per pt's daughter   CKD (chronic kidney disease)    CKD stage III (01/2018)   Daytime somnolence    Diabetes mellitus without complication (HCC)    Fracture    right fibula/wears boot cast   GERD (gastroesophageal reflux disease)    Glaucoma    Hypercholesteremia    Hyperlipidemia    Hypertension    Inclusion body myositis    diagnosed 2020   Mold exposure    7 yrs ago   PAD (peripheral artery disease) (HCC)    Prostate cancer (HCC)    Sleep apnea    uses cpap   Uses walker    Wears glasses     Family History  Problem Relation Age of Onset   Heart disease Father    Prostate cancer Brother    Cancer Brother    Diabetes Brother    Colon cancer Neg Hx    Sleep apnea Neg Hx     Past Surgical History:  Procedure Laterality Date   ALLOGRAFT APPLICATION Left 08/27/2023   Procedure: APPLICATION, ALLOGRAFT, SKIN;  Surgeon: Saundra Curl, MD;  Location: [facility] OR;  Service: Orthopedics;  Laterality: Left;   APPLICATION OF  WOUND VAC Left 08/27/2023   Procedure: APPLICATION, WOUND VAC;  Surgeon: Saundra Curl, MD;  Location: [facility] OR;  Service: Orthopedics;  Laterality: Left;   GOLD SEED IMPLANT N/A 01/08/2023   Procedure: GOLD SEED IMPLANT;  Surgeon: Mallie Seal, MD;  Location: [facility];  Service: Urology;  Laterality: N/A;  30 MINUTES   HARDWARE REMOVAL Left 08/27/2023   Procedure: REMOVAL, HARDWARE;  Surgeon: Saundra Curl, MD;  Location: [facility] OR;  Service: Orthopedics;  Laterality: Left;   INCISION AND DRAINAGE OF DEEP ABSCESS, ANKLE Left 08/27/2023   Procedure: INCISION AND DRAINAGE OF DEEP ABSCESS, ANKLE;  Surgeon: Saundra Curl, MD;  Location: [facility] OR;  Service: Orthopedics;  Laterality: Left;   LOWER EXTREMITY ANGIOGRAPHY N/A 06/12/2016   Procedure: Lower Extremity Angiography;  Surgeon: Knox Perl, MD;  Location: [facility] INVASIVE CV LAB;  Service: Cardiovascular;  Laterality: N/A;   MUSCLE BIOPSY Right 05/06/2018   Procedure: right vastus lateralis muscle biopsy;  Surgeon: Agustina Aldrich, MD;  Location: [facility] OR;  Service: Neurosurgery;  Laterality: Right;   ORIF ANKLE FRACTURE Right 10/02/2012   Procedure: OPEN REDUCTION INTERNAL FIXATION (ORIF) ANKLE FRACTURE;  Surgeon: Jasmine Mesi, MD;  Location: [facility] ORS;  Service: Orthopedics;  Laterality: Right;   ORIF ANKLE FRACTURE Left 07/21/2023   Procedure: OPEN REDUCTION INTERNAL FIXATION (ORIF) ANKLE FRACTURE;  Surgeon: Saundra Curl, MD;  Location: [facility] ORS;  Service: Orthopedics;  Laterality: Left;   PERIPHERAL VASCULAR ATHERECTOMY Left 06/12/2016   Procedure: Peripheral Vascular Atherectomy-Left Popliteal;  Surgeon: Knox Perl, MD;  Location: [facility] INVASIVE CV LAB;  Service: Cardiovascular;  Laterality: Left;   PERIPHERAL VASCULAR INTERVENTION Left 06/12/2016   Procedure: Peripheral Vascular Intervention- DCB Left Popliteal;  Surgeon: Knox Perl, MD;  Location: [facility] INVASIVE CV LAB;  Service: Cardiovascular;  Laterality: Left;   PROSTATE BIOPSY      ROTATION FLAP Left 08/27/2023   Procedure: CREATION, FLAP, ROTATION;  Surgeon: Saundra Curl, MD;  Location: [facility] OR;  Service: Orthopedics;  Laterality: Left;   SPACE OAR INSTILLATION N/A 01/08/2023   Procedure: SPACE OAR INSTILLATION;  Surgeon: Mallie Seal, MD;  Location: [facility];  Service: Urology;  Laterality: N/A;   Social History   Occupational History   Not on file  Tobacco Use   Smoking status: Former    Current packs/day: 0.00    Average packs/day: 1 pack/day for 20.0 years (20.0 ttl pk-yrs)    Types: Cigarettes    Start date: 08/22/1961    Quit date: 08/22/1981    Years since quitting: 42.1   Smokeless tobacco: Never  Vaping Use   Vaping status: Never Used  Substance and Sexual Activity   Alcohol  use: No    Alcohol /week: 0.0 standard drinks of alcohol    Drug use: No   Sexual activity: Never

## 2023-10-09 ENCOUNTER — Ambulatory Visit: Admitting: Family

## 2023-10-10 ENCOUNTER — Other Ambulatory Visit: Payer: Self-pay | Admitting: Nurse Practitioner

## 2023-10-10 MED ORDER — OXYCODONE HCL 5 MG PO TABS: 10.0000 mg | ORAL_TABLET | Freq: Two times a day (BID) | ORAL | 0 refills | Status: DC

## 2023-10-16 ENCOUNTER — Encounter: Payer: Self-pay | Admitting: Student

## 2023-10-16 ENCOUNTER — Non-Acute Institutional Stay (SKILLED_NURSING_FACILITY): Payer: Self-pay | Admitting: Student

## 2023-10-16 DIAGNOSIS — T8459XA Infection and inflammatory reaction due to other internal joint prosthesis, initial encounter: Secondary | ICD-10-CM | POA: Diagnosis not present

## 2023-10-16 DIAGNOSIS — Z96662 Presence of left artificial ankle joint: Secondary | ICD-10-CM

## 2023-10-16 NOTE — Progress Notes (Unsigned)
 Location:  Other [facility].  Nursing Home Room Number: [facility] [address] Place of Service:  SNF [id number]) Provider:  Dr. Valrie Gehrig  PCP: Barnetta Liberty, MD  Patient Care Team: Barnetta Liberty, MD as PCP - General (Internal Medicine) Barnetta Liberty, MD (Internal Medicine) Katheleen Palmer, RN as Oncology Nurse Navigator  Extended Emergency Contact Information Primary Emergency Contact: [facility] Address: [address]          [place], [place] [address] [place] Home Phone: [phone] Mobile Phone: [phone] Relation: Daughter  Code Status:  DNR Goals of care: Advanced Directive information    09/25/2023   10:11 AM  Advanced Directives  Does Patient Have a Medical Advance Directive? Yes  Type of Estate agent of [employer];Living will;Out of facility DNR (pink MOST or yellow form)  Does patient want to make changes to medical advance directive? No - Patient declined  Copy of Healthcare Power of Attorney in Chart? Yes - validated most recent copy scanned in chart (See row information)     Chief Complaint  Patient presents with   Wound Check    Wound Check.     HPI:  Pt is a 83 y.o. male seen today for Wound Check.  History of Present Illness The patient presents with a postoperative wound on the left lateral malleolus.  He is experiencing wound dehiscence at the site, which was very wet yesterday but is drier today with no drainage and some maceration. The tissue is closed below, and there is a concern about maintaining the right level of moisture to prevent crusting or excessive dryness.  He reports experiencing 'just a little' pain associated with the wound. The wound has been managed with dressings, and he has been using VoshCip for wound care. No significant pain is associated with the wound.   Past Medical History:  Diagnosis Date   Arthritis    CHF (congestive heart failure) (HCC)    per pt's daughter   CKD (chronic  kidney disease)    CKD stage III (01/2018)   Daytime somnolence    Diabetes mellitus without complication (HCC)    Fracture    right fibula/wears boot cast   GERD (gastroesophageal reflux disease)    Glaucoma    Hypercholesteremia    Hyperlipidemia    Hypertension    Inclusion body myositis    diagnosed 2020   Mold exposure    7 yrs ago   PAD (peripheral artery disease) (HCC)    Prostate cancer (HCC)    Sleep apnea    uses cpap   Uses walker    Wears glasses    Past Surgical History:  Procedure Laterality Date   ALLOGRAFT APPLICATION Left 08/27/2023   Procedure: APPLICATION, ALLOGRAFT, SKIN;  Surgeon: Saundra Curl, MD;  Location: [facility];  Service: Orthopedics;  Laterality: Left;   APPLICATION OF WOUND VAC Left 08/27/2023   Procedure: APPLICATION, WOUND VAC;  Surgeon: Saundra Curl, MD;  Location: [facility] OR;  Service: Orthopedics;  Laterality: Left;   GOLD SEED IMPLANT N/A 01/08/2023   Procedure: GOLD SEED IMPLANT;  Surgeon: Mallie Seal, MD;  Location: [facility];  Service: Urology;  Laterality: N/A;  30 MINUTES   HARDWARE REMOVAL Left 08/27/2023   Procedure: REMOVAL, HARDWARE;  Surgeon: Saundra Curl, MD;  Location: [facility] OR;  Service: Orthopedics;  Laterality: Left;   INCISION AND DRAINAGE OF DEEP ABSCESS, ANKLE Left 08/27/2023   Procedure: INCISION AND DRAINAGE OF DEEP ABSCESS, ANKLE;  Surgeon: Saundra Curl, MD;  Location: [facility] OR;  Service: Orthopedics;  Laterality: Left;   LOWER EXTREMITY ANGIOGRAPHY N/A 06/12/2016   Procedure: Lower Extremity Angiography;  Surgeon: Knox Perl, MD;  Location: [facility] INVASIVE CV LAB;  Service: Cardiovascular;  Laterality: N/A;   MUSCLE BIOPSY Right 05/06/2018   Procedure: right vastus lateralis muscle biopsy;  Surgeon: Agustina Aldrich, MD;  Location: [facility] OR;  Service: Neurosurgery;  Laterality: Right;   ORIF ANKLE FRACTURE Right 10/02/2012   Procedure: OPEN REDUCTION INTERNAL FIXATION (ORIF) ANKLE FRACTURE;  Surgeon: Jasmine Mesi, MD;  Location: [facility];  Service: Orthopedics;  Laterality: Right;   ORIF ANKLE FRACTURE Left 07/21/2023   Procedure: OPEN REDUCTION INTERNAL FIXATION (ORIF) ANKLE FRACTURE;  Surgeon: Saundra Curl, MD;  Location: [facility];  Service: Orthopedics;  Laterality: Left;   PERIPHERAL VASCULAR ATHERECTOMY Left 06/12/2016   Procedure: Peripheral Vascular Atherectomy-Left Popliteal;  Surgeon: Knox Perl, MD;  Location: [facility] INVASIVE CV LAB;  Service: Cardiovascular;  Laterality: Left;   PERIPHERAL VASCULAR INTERVENTION Left 06/12/2016   Procedure: Peripheral Vascular Intervention- DCB Left Popliteal;  Surgeon: Knox Perl, MD;  Location: [facility] INVASIVE CV LAB;  Service: Cardiovascular;  Laterality: Left;   PROSTATE BIOPSY     ROTATION FLAP Left 08/27/2023   Procedure: CREATION, FLAP, ROTATION;  Surgeon: Saundra Curl, MD;  Location: [facility];  Service: Orthopedics;  Laterality: Left;   SPACE OAR INSTILLATION N/A 01/08/2023   Procedure: SPACE OAR INSTILLATION;  Surgeon: Mallie Seal, MD;  Location: [facility];  Service: Urology;  Laterality: N/A;    Allergies  Allergen Reactions   Metformin And Related Shortness Of Breath   Pollen Extract Itching and Other (See Comments)    Some wheezing, itchy eyes, runny nose   Simbrinza [Brinzolamide-Brimonidine ] Other (See Comments)    Drainage, redness to eyes    Outpatient Encounter Medications as of 10/16/2023  Medication Sig   aspirin  EC 81 MG tablet Take 1 tablet (81 mg total) by mouth daily. Swallow whole.   clopidogrel  (PLAVIX ) 75 MG tablet Take 1 tablet (75 mg total) by mouth daily.   Continuous Blood Gluc Sensor (FREESTYLE LIBRE 2 SENSOR) MISC Inject 1 Device into the skin every 14 (fourteen) days.   docusate sodium  (COLACE) 100 MG capsule Take 1 capsule (100 mg total) by mouth 2 (two) times daily as needed for mild constipation.   dorzolamide  (TRUSOPT ) 2 % ophthalmic solution Place 1 drop into the right eye 2 (two) times daily.    ezetimibe  (ZETIA ) 10 MG tablet TAKE 1 TABLET(10 MG) BY MOUTH DAILY AFTER SUPPER   furosemide  (LASIX ) 40 MG tablet Take 1 tablet (40 mg total) by mouth daily.   gabapentin  (NEURONTIN ) 300 MG capsule Take 2 capsules (600 mg total) by mouth 3 (three) times daily.   latanoprost  (XALATAN ) 0.005 % ophthalmic solution Place 1 drop into both eyes at bedtime.   levofloxacin  (LEVAQUIN ) 750 MG tablet Take 1 tablet (750 mg total) by mouth daily. To start on 6/12   losartan  (COZAAR ) 25 MG tablet TAKE 1 TABLET(25 MG) BY MOUTH DAILY   melatonin 5 MG TABS Take 5 mg by mouth at bedtime.   metoprolol  tartrate (LOPRESSOR ) 25 MG tablet Take 1 tablet (25 mg total) by mouth 2 (two) times daily.   Multiple Vitamin (MULTIVITAMIN WITH MINERALS) TABS tablet Take 1 tablet by mouth daily with breakfast.   NOVOLIN 70/30 (70-30) 100 UNIT/ML injection Inject 50 Units into the skin in the morning and at bedtime.  oxyCODONE  (OXY IR/ROXICODONE ) 5 MG immediate release tablet Take 2 tablets (10 mg total) by mouth 2 (two) times daily. (Patient taking differently: Take 5 mg by mouth daily. As needed.)   polyethylene glycol (MIRALAX  / GLYCOLAX ) 17 g packet Take 17 g by mouth daily as needed.   PRESCRIPTION MEDICATION CPAP- At bedtime   rosuvastatin  (CRESTOR ) 10 MG tablet Take 10 mg by mouth daily.   senna (SENOKOT) 8.6 MG TABS tablet Take 2 tablets by mouth at bedtime.   tamsulosin  (FLOMAX ) 0.4 MG CAPS capsule Take 0.4 mg by mouth daily.   timolol  (TIMOPTIC ) 0.5 % ophthalmic solution Place 1 drop into the right eye 2 (two) times daily.   traZODone  (DESYREL ) 50 MG tablet Take 50 mg by mouth at bedtime.   heparin  5000 UNIT/ML injection Use 3cc intravenously every 12 hours for PICC line (Patient not taking: Reported on 10/16/2023)   tirzepatide (MOUNJARO) 2.5 MG/0.5ML Pen  (Patient not taking: Reported on 10/16/2023)   zolpidem  (AMBIEN ) 5 MG tablet Take 1 tablet (5 mg total) by mouth at bedtime as needed for sleep. (Patient not taking:  Reported on 10/16/2023)   No facility-administered encounter medications on file as of 10/16/2023.    Review of Systems  Immunization History  Administered Date(s) Administered   Influenza Split 04/30/2012, 03/19/2013, 03/10/2014, 02/07/2015   Influenza, High Dose Seasonal PF 01/29/2017, 02/16/2023   Influenza, Quadrivalent, Recombinant, Inj, Pf 02/03/2018, 03/05/2019, 02/24/2020, 02/28/2021   Influenza,inj,Quad PF,6+ Mos 03/10/2014, 01/11/2015   Influenza,trivalent, recombinat, inj, PF 03/19/2013   Influenza-Unspecified 02/03/2018   Novavax(Covid-19) Vaccine 04/18/2023   PFIZER Comirnaty(Gray Top)Covid-19 Tri-Sucrose Vaccine 03/18/2020   PFIZER(Purple Top)SARS-COV-2 Vaccination 05/28/2019, 06/19/2019, 11/19/2020   PNEUMOCOCCAL CONJUGATE-20 03/19/2023   Pfizer Covid-19 Vaccine Bivalent Booster 3yrs & up 03/13/2021   Pfizer(Comirnaty)Fall Seasonal Vaccine 12 years and older 01/16/2022   Pneumococcal Conjugate-13 12/10/2013   Pneumococcal Polysaccharide-23 05/01/2011   Tdap 04/30/2008, 08/10/2022   Zoster Recombinant(Shingrix) 01/16/2022, 03/19/2022   Zoster, Live 12/10/2013   Pertinent  Health Maintenance Due  Topic Date Due   FOOT EXAM  Never done   OPHTHALMOLOGY EXAM  Never done   INFLUENZA VACCINE  11/29/2023   HEMOGLOBIN A1C  01/21/2024      09/06/2021    8:37 AM 09/06/2021    8:00 PM 09/07/2021    9:03 AM 09/26/2023    2:21 PM 09/26/2023    2:40 PM  Fall Risk  Falls in the past year?    1 1  Was there an injury with Fall?    1 1  Fall Risk Category Calculator    2 3  (RETIRED) Patient Fall Risk Level High fall risk  High fall risk  High fall risk        Data saved with a previous flowsheet row definition   Functional Status Survey:    Vitals:   10/16/23 1009 10/16/23 1020  BP: (!) 150/74 (!) 140/60  Pulse: 91   Resp: 20   Temp: 98 F (36.7 C)   SpO2: 99%   Weight: 250 lb 3.2 oz (113.5 kg)   Height: 5' 11 (1.803 m)    Body mass index is 34.9  kg/m. Physical Exam  Skin:    Comments: Postoperative scar of the left lateral malleolus. Wound is closed with no drainage or masceration at this time and appears improved.      Labs reviewed: Recent Labs    07/23/23 0447 07/29/23 0000 08/28/23 0614 08/29/23 0542 09/09/23 0000 09/16/23 0000 09/24/23 0000 10/07/23 0000  NA 134*   < > 133* 135   < > 140 138 138  K 4.6   < > 4.5 4.4   < > 4.4 4.6 4.6  CL 101   < > 99 100   < > 102 104 103  CO2 27   < > 24 23   < > 29* 26* 24*  GLUCOSE 135*  --  151* 111*  --   --   --   --   BUN 28*   < > 23 26*   < > 22* 20 24*  CREATININE 1.24   < > 1.24 1.30*   < > 1.2 1.2 1.2  CALCIUM  8.3*   < > 9.0 9.0   < > 8.9 8.8 8.8  MG  --   --   --  2.2  --   --   --   --    < > = values in this interval not displayed.   Recent Labs    08/28/23 0614 08/29/23 0542  AST 28 27  ALT 29 24  ALKPHOS 101 94  BILITOT 0.7 0.7  PROT 6.9 7.0  ALBUMIN 3.1* 3.3*   Recent Labs    08/27/23 1404 08/28/23 0614 08/29/23 0542 09/09/23 0000 09/16/23 0000 09/24/23 0000 10/07/23 0000  WBC 5.3 9.2 6.1   < > 6.0 6.0 6.0  NEUTROABS  --   --   --    < > 3,066.00 3,096.00 3,204.00  HGB 13.5 13.0 13.2   < > 12.4* 12.7* 13.1*  HCT 40.3 38.1* 40.2   < > 39* 39* 41  MCV 92.6 91.4 92.8  --   --   --   --   PLT 184 217 203   < > 196 164 166   < > = values in this interval not displayed.   Lab Results  Component Value Date   TSH 3.910 07/13/2019   Lab Results  Component Value Date   HGBA1C 7.3 (H) 07/21/2023   Lab Results  Component Value Date   CHOL 157 02/15/2020   HDL 49 02/15/2020   LDLCALC 95 02/15/2020   TRIG 67 02/15/2020   CHOLHDL 2.7 11/08/2018    Significant Diagnostic Results in last 30 days:  No results found.  Assessment/Plan Postoperative wound dehiscence Postoperative wound dehiscence of the left lateral malleolus with  improvement since last evaluation 12 days ago. The wound is less exudative, with no drainage and some  maceration. Healing is ongoing, but lower extremity wounds typically require extended time to heal. There is concern about excessive dryness leading to crusting. Calcium  alginate was considered for slough debridement, but there is a risk of disturbing the stitches. Zero form dressing was discussed as an alternative to maintain appropriate moisture without excessive drying. - Continue VASHE therapy. - Consider zero form dressing to maintain moisture. - Rewrap the wound, ensuring it does not become too dry. Family/ staff Communication: nursing  Labs/tests ordered:  none

## 2023-10-23 ENCOUNTER — Encounter: Payer: Self-pay | Admitting: Family

## 2023-10-23 ENCOUNTER — Ambulatory Visit: Admitting: Family

## 2023-10-23 DIAGNOSIS — T8131XS Disruption of external operation (surgical) wound, not elsewhere classified, sequela: Secondary | ICD-10-CM

## 2023-10-23 DIAGNOSIS — I739 Peripheral vascular disease, unspecified: Secondary | ICD-10-CM | POA: Diagnosis not present

## 2023-10-23 DIAGNOSIS — R262 Difficulty in walking, not elsewhere classified: Secondary | ICD-10-CM | POA: Diagnosis not present

## 2023-10-23 DIAGNOSIS — M6281 Muscle weakness (generalized): Secondary | ICD-10-CM | POA: Diagnosis not present

## 2023-10-23 DIAGNOSIS — R278 Other lack of coordination: Secondary | ICD-10-CM | POA: Diagnosis not present

## 2023-10-23 DIAGNOSIS — T8469XD Infection and inflammatory reaction due to internal fixation device of other site, subsequent encounter: Secondary | ICD-10-CM | POA: Diagnosis not present

## 2023-10-23 DIAGNOSIS — M86462 Chronic osteomyelitis with draining sinus, left tibia and fibula: Secondary | ICD-10-CM

## 2023-10-23 DIAGNOSIS — R2681 Unsteadiness on feet: Secondary | ICD-10-CM | POA: Diagnosis not present

## 2023-10-23 NOTE — Progress Notes (Signed)
 Office Visit Note   Patient: Andrew Larsen           Date of Birth: Jul 19, 1940           MRN: [id number] Visit Date: 10/23/2023              Requested by: Larnell Hamilton, MD [address] [place],  [place] [address] PCP: Larnell Hamilton, MD  Chief Complaint  Patient presents with   Left Leg - Follow-up      HPI: The patient is an 83 year old gentleman seen in follow-up for osteomyelitis left distal fibula after ORIF and subsequent hardware removal.  He has completed his IV antibiotics and is now on Levaquin  p.o.  Has been having Vashe to dry dressing changes at rehab  Continues in a cam boot.  Concern today for increased swelling and increased wound size.  He discharges home tomorrow from rehab.  ABIs from March to 17th of this year: Summary:  Right: Resting right ankle-brachial index indicates mild right lower  extremity arterial disease. The right toe-brachial index is abnormal.   Left: Resting left ankle-brachial index indicates moderate left lower  extremity arterial disease. The left toe-brachial index is abnormal.   In 2018 had SFA angioplasty with Dr. Ladona.  Assessment & Plan: Visit Diagnoses:  1. Chronic osteomyelitis of left fibula with draining sinus (HCC)   2. Dehiscence of operative wound, sequela   3. PAD (peripheral artery disease) (HCC)     Plan: Referred to vascular surgery.  Will follow-up with Dr. Harden in 1 week consideration of repeat I&D and graft placement.  Hope he has revascularization options that can be done prior to surgery with us   Follow-Up Instructions: No follow-ups on file.   Ortho Exam  Patient is alert, oriented, no adenopathy, well-dressed, normal affect, normal respiratory effort. On examination in the left lower extremity there is pitting edema present without erythema or weeping laterally the incision has dehisced this is 3 cm x 1 cm with 5 mm of depth there is 50% necrotic tissue in the wound bed this does not probe to bone  today    Imaging: No results found. No images are attached to the encounter.  Labs: Lab Results  Component Value Date   HGBA1C 7.3 (H) 07/21/2023   HGBA1C 8.4 (H) 09/02/2021   HGBA1C 7.3 (H) 11/08/2018   ESRSEDRATE 22 10/07/2023   ESRSEDRATE 19 09/24/2023   ESRSEDRATE 34 09/16/2023   CRP 4.8 (H) 08/28/2023   CRP <0.8 11/27/2018   CRP <0.8 11/25/2018   REPTSTATUS 08/30/2023 FINAL 08/27/2023   GRAMSTAIN NO WBC SEEN NO ORGANISMS SEEN  08/27/2023   CULT ENTEROBACTER CLOACAE (A) 08/27/2023   LABORGA ENTEROBACTER CLOACAE 08/27/2023     Lab Results  Component Value Date   ALBUMIN 3.3 (L) 08/29/2023   ALBUMIN 3.1 (L) 08/28/2023   ALBUMIN 3.5 09/01/2021    Lab Results  Component Value Date   MG 2.2 08/29/2023   MG 1.9 09/05/2021   MG 1.7 09/03/2021   No results found for: VD25OH  No results found for: PREALBUMIN    Latest Ref Rng & Units 10/07/2023   12:00 AM 09/24/2023   12:00 AM 09/16/2023   12:00 AM  CBC EXTENDED  WBC  6.0     6.0     6.0      RBC 3.87 - 5.11 4.27     4.12     4.03      Hemoglobin 13.5 - 17.5 13.1  12.7     12.4      HCT 41 - 53 41     39     39      Platelets 150 - 400 K/uL 166     164     196      NEUT#  3,204.00     3,096.00     3,066.00         This result is from an external source.     There is no height or weight on file to calculate BMI.  Orders:  Orders Placed This Encounter  Procedures   Ambulatory referral to Vascular Surgery   No orders of the defined types were placed in this encounter.    Procedures: No procedures performed  Clinical Data: No additional findings.  ROS:  All other systems negative, except as noted in the HPI. Review of Systems  Objective: Vital Signs: There were no vitals taken for this visit.  Specialty Comments:  No specialty comments available.  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) 08/27/2023   ILD (interstitial lung disease) (HCC) 07/26/2023   PAD (peripheral artery disease) (HCC) 07/23/2023   OSA (obstructive sleep apnea) 07/23/2023   BPH (benign prostatic hyperplasia) 07/23/2023   Trimalleolar fracture of ankle, closed, left, initial encounter 07/20/2023   Malignant neoplasm of prostate (HCC) 11/04/2022   Complicated UTI (urinary tract infection) 09/06/2021   Cough 01/07/2019   Labile blood glucose    SOB (shortness of breath)    Urinary frequency    Hypoalbuminemia due to protein-calorie malnutrition (HCC)    Stage 3a chronic kidney disease (CKD) (HCC)    Physical debility 12/01/2018   Pressure injury of skin 11/27/2018   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   DMII (diabetes mellitus, type 2) (HCC) 11/03/2018   Essential hypertension 11/03/2018   Claudication in peripheral vascular disease (HCC) 06/10/2016   Morbid obesity (HCC) 09/21/2015   Upper airway cough syndrome 09/20/2015   Cough variant asthma 09/20/2015   Past Medical History:  Diagnosis Date   Arthritis    CHF (congestive heart failure) (HCC)    per pt's daughter   CKD (chronic kidney disease)    CKD stage III (01/2018)   Daytime somnolence    Diabetes mellitus without complication (HCC)    Fracture    right fibula/wears boot cast   GERD (gastroesophageal reflux disease)    Glaucoma    Hypercholesteremia    Hyperlipidemia    Hypertension    Inclusion body myositis    diagnosed 2020   Mold exposure    7 yrs ago   PAD (peripheral artery disease) (HCC)    Prostate cancer (HCC)    Sleep apnea    uses cpap   Uses walker    Wears glasses     Family History  Problem Relation Age of Onset   Heart disease Father    Prostate cancer Brother    Cancer Brother    Diabetes Brother    Colon cancer Neg Hx    Sleep apnea Neg Hx     Past Surgical History:  Procedure Laterality Date   ALLOGRAFT APPLICATION Left 08/27/2023   Procedure:  APPLICATION, ALLOGRAFT, SKIN;  Surgeon: Beverley Evalene BIRCH, MD;  Location: [facility] OR;  Service: Orthopedics;  Laterality: Left;   APPLICATION OF WOUND VAC Left 08/27/2023   Procedure: APPLICATION, WOUND  VAC;  Surgeon: Beverley Evalene BIRCH, MD;  Location: [facility] OR;  Service: Orthopedics;  Laterality: Left;   GOLD SEED IMPLANT N/A 01/08/2023   Procedure: GOLD SEED IMPLANT;  Surgeon: Lovie Arlyss CROME, MD;  Location: [facility];  Service: Urology;  Laterality: N/A;  30 MINUTES   HARDWARE REMOVAL Left 08/27/2023   Procedure: REMOVAL, HARDWARE;  Surgeon: Beverley Evalene BIRCH, MD;  Location: [facility] OR;  Service: Orthopedics;  Laterality: Left;   INCISION AND DRAINAGE OF DEEP ABSCESS, ANKLE Left 08/27/2023   Procedure: INCISION AND DRAINAGE OF DEEP ABSCESS, ANKLE;  Surgeon: Beverley Evalene BIRCH, MD;  Location: [facility] OR;  Service: Orthopedics;  Laterality: Left;   LOWER EXTREMITY ANGIOGRAPHY N/A 06/12/2016   Procedure: Lower Extremity Angiography;  Surgeon: Gordy Bergamo, MD;  Location: [facility] INVASIVE CV LAB;  Service: Cardiovascular;  Laterality: N/A;   MUSCLE BIOPSY Right 05/06/2018   Procedure: right vastus lateralis muscle biopsy;  Surgeon: Louis Shove, MD;  Location: [facility] OR;  Service: Neurosurgery;  Laterality: Right;   ORIF ANKLE FRACTURE Right 10/02/2012   Procedure: OPEN REDUCTION INTERNAL FIXATION (ORIF) ANKLE FRACTURE;  Surgeon: Cordella Glendia Hutchinson, MD;  Location: [facility] ORS;  Service: Orthopedics;  Laterality: Right;   ORIF ANKLE FRACTURE Left 07/21/2023   Procedure: OPEN REDUCTION INTERNAL FIXATION (ORIF) ANKLE FRACTURE;  Surgeon: Beverley Evalene BIRCH, MD;  Location: [facility] ORS;  Service: Orthopedics;  Laterality: Left;   PERIPHERAL VASCULAR ATHERECTOMY Left 06/12/2016   Procedure: Peripheral Vascular Atherectomy-Left Popliteal;  Surgeon: Gordy Bergamo, MD;  Location: [facility] INVASIVE CV LAB;  Service: Cardiovascular;  Laterality: Left;   PERIPHERAL VASCULAR INTERVENTION Left 06/12/2016   Procedure: Peripheral Vascular Intervention-  DCB Left Popliteal;  Surgeon: Gordy Bergamo, MD;  Location: [facility] INVASIVE CV LAB;  Service: Cardiovascular;  Laterality: Left;   PROSTATE BIOPSY     ROTATION FLAP Left 08/27/2023   Procedure: CREATION, FLAP, ROTATION;  Surgeon: Beverley Evalene BIRCH, MD;  Location: [facility] OR;  Service: Orthopedics;  Laterality: Left;   SPACE OAR INSTILLATION N/A 01/08/2023   Procedure: SPACE OAR INSTILLATION;  Surgeon: Lovie Arlyss CROME, MD;  Location: [facility];  Service: Urology;  Laterality: N/A;   Social History   Occupational History   Not on file  Tobacco Use   Smoking status: Former    Current packs/day: 0.00    Average packs/day: 1 pack/day for 20.0 years (20.0 ttl pk-yrs)    Types: Cigarettes    Start date: 08/22/1961    Quit date: 08/22/1981    Years since quitting: 42.1   Smokeless tobacco: Never  Vaping Use   Vaping status: Never Used  Substance and Sexual Activity   Alcohol  use: No    Alcohol /week: 0.0 standard drinks of alcohol    Drug use: No   Sexual activity: Never

## 2023-10-24 ENCOUNTER — Encounter: Payer: Self-pay | Admitting: Nurse Practitioner

## 2023-10-24 ENCOUNTER — Non-Acute Institutional Stay (SKILLED_NURSING_FACILITY): Payer: Self-pay | Admitting: Nurse Practitioner

## 2023-10-24 DIAGNOSIS — E1122 Type 2 diabetes mellitus with diabetic chronic kidney disease: Secondary | ICD-10-CM | POA: Diagnosis not present

## 2023-10-24 DIAGNOSIS — I10 Essential (primary) hypertension: Secondary | ICD-10-CM | POA: Diagnosis not present

## 2023-10-24 DIAGNOSIS — B9689 Other specified bacterial agents as the cause of diseases classified elsewhere: Secondary | ICD-10-CM

## 2023-10-24 DIAGNOSIS — T8459XA Infection and inflammatory reaction due to other internal joint prosthesis, initial encounter: Secondary | ICD-10-CM | POA: Diagnosis not present

## 2023-10-24 DIAGNOSIS — G4700 Insomnia, unspecified: Secondary | ICD-10-CM

## 2023-10-24 DIAGNOSIS — R7881 Bacteremia: Secondary | ICD-10-CM

## 2023-10-24 DIAGNOSIS — Z96662 Presence of left artificial ankle joint: Secondary | ICD-10-CM

## 2023-10-24 DIAGNOSIS — N1831 Chronic kidney disease, stage 3a: Secondary | ICD-10-CM | POA: Diagnosis not present

## 2023-10-24 DIAGNOSIS — I739 Peripheral vascular disease, unspecified: Secondary | ICD-10-CM

## 2023-10-24 NOTE — Progress Notes (Signed)
 Location:  Other [facility].  Nursing Home Room Number: [facility]. [address] [id number] Place of Service:  SNF (31) Harlene An ,NP  ERE:Ynotzmij, Glendia, MD  Patient Care Team: Larnell Glendia, MD as PCP - General (Internal Medicine) Larnell Glendia, MD (Internal Medicine) Vertell Pont, RN as Oncology Nurse Navigator  Extended Emergency Contact Information Primary Emergency Contact: [facility] Address: [address]          [place], [place] [address] [place] Home Phone: [phone] Mobile Phone: [phone] Relation: Daughter  Goals of care: Advanced Directive information    09/25/2023   10:11 AM  Advanced Directives  Does Patient Have a Medical Advance Directive? Yes  Type of Estate agent of [place];Living will;Out of facility DNR (pink MOST or yellow form)  Does patient want to make changes to medical advance directive? No - Patient declined  Copy of Healthcare Power of Attorney in Chart? Yes - validated most recent copy scanned in chart (See row information)     Chief Complaint  Patient presents with   Discharge Note    Discharge.     HPI:  Pt is a 83 y.o. male seen today for Discharge home  The patient, with osteomyelitis and peripheral artery disease, who has with a non-healing wound on the left distal fibula.  He has a history of osteomyelitis in the left distal fibula following an open reduction and internal fixation (ORIF) procedure, with subsequent hardware removal. Despite completing a course of intravenous antibiotics followed by oral antibiotics, the wound has worsened, with increased swelling and size noted. He uses a cam boot for support. Home health care is also involved in his care to be there for his transition home.   He has mild right lower extremity arterial disease and moderate peripheral artery disease in the left leg, which is contributing to inadequate blood flow and impaired healing of the  wound.  He has a history of diabetes, which he manages independently. His blood sugars have been slightly elevated, and he is due for a follow-up A1c test which can be obtained by PCP on follow up. No issues with his diabetic medications.  He is on Flomax  for benign prostatic hyperplasia (BPH) and reports occasional urinary urgency.   He is also on Crestor  for cholesterol  Senokot for constipation which is controlled.   Takes melatonin and trazodone  for sleep. He reports difficulty sleeping and issues with his CPAP machine.  No uncontrolled pain, breathing problems, chest pain, or palpitations. He reports a single episode of diarrhea at the beginning of his antibiotic treatment but no ongoing issues.   Past Medical History:  Diagnosis Date   Arthritis    CHF (congestive heart failure) (HCC)    per pt's daughter   CKD (chronic kidney disease)    CKD stage III (01/2018)   Daytime somnolence    Diabetes mellitus without complication (HCC)    Fracture    right fibula/wears boot cast   GERD (gastroesophageal reflux disease)    Glaucoma    Hypercholesteremia    Hyperlipidemia    Hypertension    Inclusion body myositis    diagnosed 2020   Mold exposure    7 yrs ago   PAD (peripheral artery disease) (HCC)    Prostate cancer (HCC)    Sleep apnea    uses cpap   Uses walker    Wears glasses    Past Surgical History:  Procedure Laterality Date   ALLOGRAFT APPLICATION Left 08/27/2023  Procedure: APPLICATION, ALLOGRAFT, SKIN;  Surgeon: Beverley Evalene BIRCH, MD;  Location: [facility] OR;  Service: Orthopedics;  Laterality: Left;   APPLICATION OF WOUND VAC Left 08/27/2023   Procedure: APPLICATION, WOUND VAC;  Surgeon: Beverley Evalene BIRCH, MD;  [facility]: [facility];  Service: Orthopedics;  Laterality: Left;   GOLD SEED IMPLANT N/A 01/08/2023   Procedure: GOLD SEED IMPLANT;  Surgeon: Lovie Arlyss CROME, MD;  Location: [facility];  Service: Urology;  Laterality: N/A;  30 MINUTES   HARDWARE  REMOVAL Left 08/27/2023   Procedure: REMOVAL, HARDWARE;  Surgeon: Beverley Evalene BIRCH, MD;  Location: [facility] OR;  Service: Orthopedics;  Laterality: Left;   INCISION AND DRAINAGE OF DEEP ABSCESS, ANKLE Left 08/27/2023   Procedure: INCISION AND DRAINAGE OF DEEP ABSCESS, ANKLE;  Surgeon: Beverley Evalene BIRCH, MD;  Location: [facility] OR;  Service: Orthopedics;  Laterality: Left;   LOWER EXTREMITY ANGIOGRAPHY N/A 06/12/2016   Procedure: Lower Extremity Angiography;  Surgeon: Gordy Bergamo, MD;  Location: [facility] INVASIVE CV LAB;  Service: Cardiovascular;  Laterality: N/A;   MUSCLE BIOPSY Right 05/06/2018   Procedure: right vastus lateralis muscle biopsy;  Surgeon: Louis Shove, MD;  Location: [facility] OR;  Service: Neurosurgery;  Laterality: Right;   ORIF ANKLE FRACTURE Right 10/02/2012   Procedure: OPEN REDUCTION INTERNAL FIXATION (ORIF) ANKLE FRACTURE;  Surgeon: Cordella Glendia Hutchinson, MD;  Location: [facility] ORS;  Service: Orthopedics;  Laterality: Right;   ORIF ANKLE FRACTURE Left 07/21/2023   Procedure: OPEN REDUCTION INTERNAL FIXATION (ORIF) ANKLE FRACTURE;  Surgeon: Beverley Evalene BIRCH, MD;  Location: [facility] ORS;  Service: Orthopedics;  Laterality: Left;   PERIPHERAL VASCULAR ATHERECTOMY Left 06/12/2016   Procedure: Peripheral Vascular Atherectomy-Left Popliteal;  Surgeon: Gordy Bergamo, MD;  Location: [facility] INVASIVE CV LAB;  Service: Cardiovascular;  Laterality: Left;   PERIPHERAL VASCULAR INTERVENTION Left 06/12/2016   Procedure: Peripheral Vascular Intervention- DCB Left Popliteal;  Surgeon: Gordy Bergamo, MD;  Location: [facility] INVASIVE CV LAB;  Service: Cardiovascular;  Laterality: Left;   PROSTATE BIOPSY     ROTATION FLAP Left 08/27/2023   Procedure: CREATION, FLAP, ROTATION;  Surgeon: Beverley Evalene BIRCH, MD;  Location: [facility] OR;  Service: Orthopedics;  Laterality: Left;   SPACE OAR INSTILLATION N/A 01/08/2023   Procedure: SPACE OAR INSTILLATION;  Surgeon: Lovie Arlyss CROME, MD;  Location: [facility] [place];  Service: Urology;  Laterality: N/A;     Allergies  Allergen Reactions   Metformin And Related Shortness Of Breath   Pollen Extract Itching and Other (See Comments)    Some wheezing, itchy eyes, runny nose   Simbrinza [Brinzolamide-Brimonidine ] Other (See Comments)    Drainage, redness to eyes    Outpatient Encounter Medications as of 10/24/2023  Medication Sig   aspirin  EC 81 MG tablet Take 1 tablet (81 mg total) by mouth daily. Swallow whole.   clopidogrel  (PLAVIX ) 75 MG tablet Take 1 tablet (75 mg total) by mouth daily.   Continuous Blood Gluc Sensor (FREESTYLE LIBRE 2 SENSOR) MISC Inject 1 Device into the skin every 14 (fourteen) days.   docusate sodium  (COLACE) 100 MG capsule Take 1 capsule (100 mg total) by mouth 2 (two) times daily as needed for mild constipation.   dorzolamide  (TRUSOPT ) 2 % ophthalmic solution Place 1 drop into the right eye 2 (two) times daily.   ezetimibe  (ZETIA ) 10 MG tablet TAKE 1 TABLET(10 MG) BY MOUTH DAILY AFTER SUPPER   furosemide  (LASIX ) 40 MG tablet Take 1 tablet (40 mg total) by mouth daily.   gabapentin  (NEURONTIN ) 300 MG  capsule Take 2 capsules (600 mg total) by mouth 3 (three) times daily.   heparin  5000 UNIT/ML injection Use 3cc intravenously every 12 hours for PICC line   latanoprost  (XALATAN ) 0.005 % ophthalmic solution Place 1 drop into both eyes at bedtime.   levofloxacin  (LEVAQUIN ) 750 MG tablet Take 1 tablet (750 mg total) by mouth daily. To start on 6/12   losartan  (COZAAR ) 25 MG tablet TAKE 1 TABLET(25 MG) BY MOUTH DAILY   metoprolol  tartrate (LOPRESSOR ) 25 MG tablet Take 1 tablet (25 mg total) by mouth 2 (two) times daily.   Multiple Vitamin (MULTIVITAMIN WITH MINERALS) TABS tablet Take 1 tablet by mouth daily with breakfast.   NOVOLIN 70/30 (70-30) 100 UNIT/ML injection Inject 50 Units into the skin in the morning and at bedtime.   oxyCODONE  (OXY IR/ROXICODONE ) 5 MG immediate release tablet Take 2 tablets (10 mg total) by mouth 2 (two) times daily. (Patient taking differently:  Take 5 mg by mouth daily. As needed.)   polyethylene glycol (MIRALAX  / GLYCOLAX ) 17 g packet Take 17 g by mouth daily as needed.   PRESCRIPTION MEDICATION CPAP- At bedtime   rosuvastatin  (CRESTOR ) 10 MG tablet Take 10 mg by mouth daily.   senna (SENOKOT) 8.6 MG TABS tablet Take 2 tablets by mouth at bedtime.   tamsulosin  (FLOMAX ) 0.4 MG CAPS capsule Take 0.4 mg by mouth daily.   timolol  (TIMOPTIC ) 0.5 % ophthalmic solution Place 1 drop into the right eye 2 (two) times daily.   traZODone  (DESYREL ) 50 MG tablet Take 50 mg by mouth at bedtime.   melatonin 5 MG TABS Take 5 mg by mouth at bedtime. (Patient not taking: Reported on 10/24/2023)   tirzepatide (MOUNJARO) 2.5 MG/0.5ML Pen  (Patient not taking: Reported on 10/24/2023)   zolpidem  (AMBIEN ) 5 MG tablet Take 1 tablet (5 mg total) by mouth at bedtime as needed for sleep. (Patient not taking: Reported on 10/24/2023)   No facility-administered encounter medications on file as of 10/24/2023.    Review of Systems  Constitutional:  Negative for activity change, appetite change, fatigue and unexpected weight change.  HENT:  Negative for congestion and hearing loss.   Eyes: Negative.   Respiratory:  Negative for cough and shortness of breath.   Cardiovascular:  Positive for leg swelling. Negative for chest pain and palpitations.  Gastrointestinal:  Negative for abdominal pain, constipation and diarrhea.  Genitourinary:  Negative for difficulty urinating and dysuria.  Musculoskeletal:  Negative for arthralgias and myalgias.  Skin:  Positive for wound. Negative for color change.  Neurological:  Negative for dizziness and weakness.  Psychiatric/Behavioral:  Negative for agitation, behavioral problems and confusion.     Immunization History  Administered Date(s) Administered   Influenza Split 04/30/2012, 03/19/2013, 03/10/2014, 02/07/2015   Influenza, High Dose Seasonal PF 01/29/2017, 02/16/2023   Influenza, Quadrivalent, Recombinant, Inj, Pf  02/03/2018, 03/05/2019, 02/24/2020, 02/28/2021   Influenza,inj,Quad PF,6+ Mos 03/10/2014, 01/11/2015   Influenza,trivalent, recombinat, inj, PF 03/19/2013   Influenza-Unspecified 02/03/2018   Novavax(Covid-19) Vaccine 04/18/2023   PFIZER Comirnaty(Gray Top)Covid-19 Tri-Sucrose Vaccine 03/18/2020   PFIZER(Purple Top)SARS-COV-2 Vaccination 05/28/2019, 06/19/2019, 11/19/2020   PNEUMOCOCCAL CONJUGATE-20 03/19/2023   Pfizer Covid-19 Vaccine Bivalent Booster 12yrs & up 03/13/2021   Pfizer(Comirnaty)Fall Seasonal Vaccine 12 years and older 01/16/2022   Pneumococcal Conjugate-13 12/10/2013   Pneumococcal Polysaccharide-23 05/01/2011   Tdap 04/30/2008, 08/10/2022   Zoster Recombinant(Shingrix) 01/16/2022, 03/19/2022   Zoster, Live 12/10/2013   Pertinent  Health Maintenance Due  Topic Date Due   FOOT EXAM  Never  done   OPHTHALMOLOGY EXAM  Never done   INFLUENZA VACCINE  11/29/2023   HEMOGLOBIN A1C  01/21/2024      09/06/2021    8:37 AM 09/06/2021    8:00 PM 09/07/2021    9:03 AM 09/26/2023    2:21 PM 09/26/2023    2:40 PM  Fall Risk  Falls in the past year?    1 1  Was there an injury with Fall?    1 1  Fall Risk Category Calculator    2 3  (RETIRED) Patient Fall Risk Level High fall risk  High fall risk  High fall risk        Data saved with a previous flowsheet row definition   Functional Status Survey:    Vitals:   10/24/23 1215  BP: 137/72  Pulse: 68  Resp: 18  Temp: 97.6 F (36.4 C)  SpO2: 99%  Weight: 250 lb 3.2 oz (113.5 kg)  Height: 5' 11 (1.803 m)   Body mass index is 34.9 kg/m. Physical Exam Constitutional:      General: He is not in acute distress.    Appearance: He is well-developed. He is not diaphoretic.  HENT:     Head: Normocephalic and atraumatic.     Right Ear: External ear normal.     Left Ear: External ear normal.     Mouth/Throat:     Pharynx: No oropharyngeal exudate.   Eyes:     Conjunctiva/sclera: Conjunctivae normal.     Pupils: Pupils are  equal, round, and reactive to light.    Cardiovascular:     Rate and Rhythm: Normal rate and regular rhythm.     Heart sounds: Normal heart sounds.  Pulmonary:     Effort: Pulmonary effort is normal.     Breath sounds: Normal breath sounds.  Abdominal:     General: Bowel sounds are normal.     Palpations: Abdomen is soft.   Musculoskeletal:        General: No tenderness.     Cervical back: Normal range of motion and neck supple.     Right lower leg: Edema present.     Left lower leg: Edema present.     Comments: Left leg with dressing and ace wrap   Skin:    General: Skin is warm and dry.   Neurological:     Mental Status: He is alert and oriented to person, place, and time.     Labs reviewed: Recent Labs    07/23/23 0447 07/29/23 0000 08/28/23 0614 08/29/23 0542 09/09/23 0000 09/16/23 0000 09/24/23 0000 10/07/23 0000  NA 134*   < > 133* 135   < > 140 138 138  K 4.6   < > 4.5 4.4   < > 4.4 4.6 4.6  CL 101   < > 99 100   < > 102 104 103  CO2 27   < > 24 23   < > 29* 26* 24*  GLUCOSE 135*  --  151* 111*  --   --   --   --   BUN 28*   < > 23 26*   < > 22* 20 24*  CREATININE 1.24   < > 1.24 1.30*   < > 1.2 1.2 1.2  CALCIUM  8.3*   < > 9.0 9.0   < > 8.9 8.8 8.8  MG  --   --   --  2.2  --   --   --   --    < > =  values in this interval not displayed.   Recent Labs    08/28/23 0614 08/29/23 0542  AST 28 27  ALT 29 24  ALKPHOS 101 94  BILITOT 0.7 0.7  PROT 6.9 7.0  ALBUMIN 3.1* 3.3*   Recent Labs    08/27/23 1404 08/28/23 0614 08/29/23 0542 09/09/23 0000 09/16/23 0000 09/24/23 0000 10/07/23 0000  WBC 5.3 9.2 6.1   < > 6.0 6.0 6.0  NEUTROABS  --   --   --    < > 3,066.00 3,096.00 3,204.00  HGB 13.5 13.0 13.2   < > 12.4* 12.7* 13.1*  HCT 40.3 38.1* 40.2   < > 39* 39* 41  MCV 92.6 91.4 92.8  --   --   --   --   PLT 184 217 203   < > 196 164 166   < > = values in this interval not displayed.   Lab Results  Component Value Date   TSH 3.910 07/13/2019    Lab Results  Component Value Date   HGBA1C 7.3 (H) 07/21/2023   Lab Results  Component Value Date   CHOL 157 02/15/2020   HDL 49 02/15/2020   LDLCALC 95 02/15/2020   TRIG 67 02/15/2020   CHOLHDL 2.7 11/08/2018    Significant Diagnostic Results in last 30 days:  No results found.  Assessment/Plan 1. 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  Continue dressing per orthopedics  2. Type 2 diabetes mellitus with stage 3a chronic kidney disease, without long-term current use of insulin  (HCC) Due for A1c, will need PCP to follow up  Will continue current regimen at this time.   3. Bacteremia due to Enterobacter species Has completed IV and oral antibiotics at this time, ongoing close follow up with orthopedic  4. PAD (peripheral artery disease) [facility]) Vascular referral has been placed and plans to follow up.   5. Essential hypertension Blood pressure well controlled, goal bp <140/90 Continue current medications and dietary modifications follow metabolic panel  6. Insomnia, unspecified type Has not slept as well since in facility, continue current regimen and follow up with PCP at this time   pt is stable for discharge-will need PT/OT/nursing per home health. No DME needed. Will need to follow up with PCP within a week.  Pt is ready for discharge despite ongoing wound needs. Has nursing, ortho follow up and strong family support.  Earnie Rockhold K. Caro BODILY [facility] [phone]

## 2023-10-25 DIAGNOSIS — G4700 Insomnia, unspecified: Secondary | ICD-10-CM | POA: Diagnosis not present

## 2023-10-25 DIAGNOSIS — Z794 Long term (current) use of insulin: Secondary | ICD-10-CM | POA: Diagnosis not present

## 2023-10-25 DIAGNOSIS — Z7902 Long term (current) use of antithrombotics/antiplatelets: Secondary | ICD-10-CM | POA: Diagnosis not present

## 2023-10-25 DIAGNOSIS — N400 Enlarged prostate without lower urinary tract symptoms: Secondary | ICD-10-CM | POA: Diagnosis not present

## 2023-10-25 DIAGNOSIS — I130 Hypertensive heart and chronic kidney disease with heart failure and stage 1 through stage 4 chronic kidney disease, or unspecified chronic kidney disease: Secondary | ICD-10-CM | POA: Diagnosis not present

## 2023-10-25 DIAGNOSIS — N1831 Chronic kidney disease, stage 3a: Secondary | ICD-10-CM | POA: Diagnosis not present

## 2023-10-25 DIAGNOSIS — Z8546 Personal history of malignant neoplasm of prostate: Secondary | ICD-10-CM | POA: Diagnosis not present

## 2023-10-25 DIAGNOSIS — E1122 Type 2 diabetes mellitus with diabetic chronic kidney disease: Secondary | ICD-10-CM | POA: Diagnosis not present

## 2023-10-25 DIAGNOSIS — Z96662 Presence of left artificial ankle joint: Secondary | ICD-10-CM | POA: Diagnosis not present

## 2023-10-25 DIAGNOSIS — T8459XD Infection and inflammatory reaction due to other internal joint prosthesis, subsequent encounter: Secondary | ICD-10-CM | POA: Diagnosis not present

## 2023-10-25 DIAGNOSIS — E1151 Type 2 diabetes mellitus with diabetic peripheral angiopathy without gangrene: Secondary | ICD-10-CM | POA: Diagnosis not present

## 2023-10-25 DIAGNOSIS — I509 Heart failure, unspecified: Secondary | ICD-10-CM | POA: Diagnosis not present

## 2023-10-25 DIAGNOSIS — K5900 Constipation, unspecified: Secondary | ICD-10-CM | POA: Diagnosis not present

## 2023-10-25 DIAGNOSIS — G4730 Sleep apnea, unspecified: Secondary | ICD-10-CM | POA: Diagnosis not present

## 2023-10-25 DIAGNOSIS — E7800 Pure hypercholesterolemia, unspecified: Secondary | ICD-10-CM | POA: Diagnosis not present

## 2023-10-25 DIAGNOSIS — H409 Unspecified glaucoma: Secondary | ICD-10-CM | POA: Diagnosis not present

## 2023-10-25 DIAGNOSIS — M1990 Unspecified osteoarthritis, unspecified site: Secondary | ICD-10-CM | POA: Diagnosis not present

## 2023-10-25 DIAGNOSIS — K219 Gastro-esophageal reflux disease without esophagitis: Secondary | ICD-10-CM | POA: Diagnosis not present

## 2023-10-28 ENCOUNTER — Encounter: Payer: Self-pay | Admitting: Orthopedic Surgery

## 2023-10-28 ENCOUNTER — Ambulatory Visit (INDEPENDENT_AMBULATORY_CARE_PROVIDER_SITE_OTHER): Admitting: Orthopedic Surgery

## 2023-10-28 ENCOUNTER — Other Ambulatory Visit: Payer: Self-pay

## 2023-10-28 DIAGNOSIS — M86462 Chronic osteomyelitis with draining sinus, left tibia and fibula: Secondary | ICD-10-CM | POA: Diagnosis not present

## 2023-10-28 DIAGNOSIS — T8131XS Disruption of external operation (surgical) wound, not elsewhere classified, sequela: Secondary | ICD-10-CM

## 2023-10-28 DIAGNOSIS — I739 Peripheral vascular disease, unspecified: Secondary | ICD-10-CM

## 2023-10-28 DIAGNOSIS — G4733 Obstructive sleep apnea (adult) (pediatric): Secondary | ICD-10-CM | POA: Diagnosis not present

## 2023-10-28 NOTE — Progress Notes (Signed)
 Office Visit Note   Patient: Andrew Larsen           Date of Birth: Sep 22, 1940           MRN: [id number] Visit Date: 10/28/2023              Requested by: Larnell Hamilton, MD [id number] [place],  [place] [address] PCP: Larnell Hamilton, MD  Chief Complaint  Patient presents with   Left Ankle - Follow-up      HPI: Patient is an 83 year old gentleman who is status post open reduction internal fixation for a left ankle fracture and most recently status post debridement and tissue grafting.  Patient is seen in follow-up  Assessment & Plan: Visit Diagnoses:  1. Chronic osteomyelitis of left fibula with draining sinus (HCC)   2. Dehiscence of operative wound, sequela   3. PAD (peripheral artery disease) (HCC)     Plan: Will remove the remaining sutures today.  Continue with Vashe dressing changes, continue elevation, prescription for Percocet.  Three-view radiographs of the left ankle at follow-up.  Follow-Up Instructions: No follow-ups on file.   Ortho Exam  Patient is alert, oriented, no adenopathy, well-dressed, normal affect, normal respiratory effort. Examination the wound is well-approximated.  Currently measures 0.2 x 2 cm.  The wound was debrided.  Patient is currently finishing up his Levaquin .  Remaining sutures removed with a Vashe dressing change applied.    Imaging: No results found. No images are attached to the encounter.  Labs: Lab Results  Component Value Date   HGBA1C 7.3 (H) 07/21/2023   HGBA1C 8.4 (H) 09/02/2021   HGBA1C 7.3 (H) 11/08/2018   ESRSEDRATE 22 10/07/2023   ESRSEDRATE 19 09/24/2023   ESRSEDRATE 34 09/16/2023   CRP 4.8 (H) 08/28/2023   CRP <0.8 11/27/2018   CRP <0.8 11/25/2018   REPTSTATUS 08/30/2023 FINAL 08/27/2023   GRAMSTAIN NO WBC SEEN NO ORGANISMS SEEN  08/27/2023   CULT ENTEROBACTER CLOACAE (A) 08/27/2023   LABORGA ENTEROBACTER CLOACAE 08/27/2023     Lab Results  Component Value Date   ALBUMIN 3.3 (L) 08/29/2023    ALBUMIN 3.1 (L) 08/28/2023   ALBUMIN 3.5 09/01/2021    Lab Results  Component Value Date   MG 2.2 08/29/2023   MG 1.9 09/05/2021   MG 1.7 09/03/2021   No results found for: VD25OH  No results found for: PREALBUMIN    Latest Ref Rng & Units 10/07/2023   12:00 AM 09/24/2023   12:00 AM 09/16/2023   12:00 AM  CBC EXTENDED  WBC  6.0     6.0     6.0      RBC 3.87 - 5.11 4.27     4.12     4.03      Hemoglobin 13.5 - 17.5 13.1     12.7     12.4      HCT 41 - 53 41     39     39      Platelets 150 - 400 K/uL 166     164     196      NEUT#  3,204.00     3,096.00     3,066.00         This result is from an external source.     There is no height or weight on file to calculate BMI.  Orders:  No orders of the defined types were placed in this encounter.  No orders of the defined types were  placed in this encounter.    Procedures: No procedures performed  Clinical Data: No additional findings.  ROS:  All other systems negative, except as noted in the HPI. Review of Systems  Objective: Vital Signs: There were no vitals taken for this visit.  Specialty Comments:  No specialty comments available.  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) 08/27/2023   ILD (interstitial lung disease) (HCC) 07/26/2023   PAD (peripheral artery disease) (HCC) 07/23/2023   OSA (obstructive sleep apnea) 07/23/2023   BPH (benign prostatic hyperplasia) 07/23/2023   Trimalleolar fracture of ankle, closed, left, initial encounter 07/20/2023   Malignant neoplasm of prostate (HCC) 11/04/2022   Complicated UTI (urinary tract infection) 09/06/2021   Cough 01/07/2019   Labile blood glucose    SOB (shortness of breath)    Urinary frequency    Hypoalbuminemia due to protein-calorie malnutrition (HCC)    Stage 3a chronic kidney disease (CKD) (HCC)     Physical debility 12/01/2018   Pressure injury of skin 11/27/2018   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   DMII (diabetes mellitus, type 2) (HCC) 11/03/2018   Essential hypertension 11/03/2018   Claudication in peripheral vascular disease (HCC) 06/10/2016   Morbid obesity (HCC) 09/21/2015   Upper airway cough syndrome 09/20/2015   Cough variant asthma 09/20/2015   Past Medical History:  Diagnosis Date   Arthritis    CHF (congestive heart failure) (HCC)    per pt's daughter   CKD (chronic kidney disease)    CKD stage III (01/2018)   Daytime somnolence    Diabetes mellitus without complication (HCC)    Fracture    right fibula/wears boot cast   GERD (gastroesophageal reflux disease)    Glaucoma    Hypercholesteremia    Hyperlipidemia    Hypertension    Inclusion body myositis    diagnosed 2020   Mold exposure    7 yrs ago   PAD (peripheral artery disease) (HCC)    Prostate cancer (HCC)    Sleep apnea    uses cpap   Uses walker    Wears glasses     Family History  Problem Relation Age of Onset   Heart disease Father    Prostate cancer Brother    Cancer Brother    Diabetes Brother    Colon cancer Neg Hx    Sleep apnea Neg Hx     Past Surgical History:  Procedure Laterality Date   ALLOGRAFT APPLICATION Left 08/27/2023   Procedure: APPLICATION, ALLOGRAFT, SKIN;  Surgeon: Beverley Evalene BIRCH, MD;  Location: [facility] OR;  Service: Orthopedics;  Laterality: Left;   APPLICATION OF WOUND VAC Left 08/27/2023   Procedure: APPLICATION, WOUND VAC;  Surgeon: Beverley Evalene BIRCH, MD;  Location: [facility] OR;  Service: Orthopedics;  Laterality: Left;   GOLD SEED IMPLANT N/A 01/08/2023   Procedure: GOLD SEED IMPLANT;  Surgeon: Lovie Arlyss CROME, MD;  Location: [facility];  Service: Urology;  Laterality: N/A;  30 MINUTES   HARDWARE REMOVAL Left 08/27/2023   Procedure: REMOVAL, HARDWARE;  Surgeon: Beverley Evalene BIRCH, MD;  Location: [facility] OR;  Service: Orthopedics;  Laterality: Left;   INCISION  AND DRAINAGE OF DEEP ABSCESS, ANKLE Left 08/27/2023   Procedure: INCISION AND DRAINAGE OF DEEP ABSCESS, ANKLE;  Surgeon: Beverley Evalene BIRCH, MD;  Location: [facility] OR;  Service: Orthopedics;  Laterality: Left;   LOWER EXTREMITY ANGIOGRAPHY  N/A 06/12/2016   Procedure: Lower Extremity Angiography;  Surgeon: Gordy Bergamo, MD;  Location: [facility] INVASIVE CV LAB;  Service: Cardiovascular;  Laterality: N/A;   MUSCLE BIOPSY Right 05/06/2018   Procedure: right vastus lateralis muscle biopsy;  Surgeon: Louis Shove, MD;  Location: [facility] OR;  Service: Neurosurgery;  Laterality: Right;   ORIF ANKLE FRACTURE Right 10/02/2012   Procedure: OPEN REDUCTION INTERNAL FIXATION (ORIF) ANKLE FRACTURE;  Surgeon: Cordella Glendia Hutchinson, MD;  Location: [facility];  Service: Orthopedics;  Laterality: Right;   ORIF ANKLE FRACTURE Left 07/21/2023   Procedure: OPEN REDUCTION INTERNAL FIXATION (ORIF) ANKLE FRACTURE;  Surgeon: Beverley Evalene BIRCH, MD;  Location: [facility];  Service: Orthopedics;  Laterality: Left;   PERIPHERAL VASCULAR ATHERECTOMY Left 06/12/2016   Procedure: Peripheral Vascular Atherectomy-Left Popliteal;  Surgeon: Gordy Bergamo, MD;  Location: [facility] INVASIVE CV LAB;  Service: Cardiovascular;  Laterality: Left;   PERIPHERAL VASCULAR INTERVENTION Left 06/12/2016   Procedure: Peripheral Vascular Intervention- DCB Left Popliteal;  Surgeon: Gordy Bergamo, MD;  Location: [facility] INVASIVE CV LAB;  Service: Cardiovascular;  Laterality: Left;   PROSTATE BIOPSY     ROTATION FLAP Left 08/27/2023   Procedure: CREATION, FLAP, ROTATION;  Surgeon: Beverley Evalene BIRCH, MD;  Location: [facility];  Service: Orthopedics;  Laterality: Left;   SPACE OAR INSTILLATION N/A 01/08/2023   Procedure: SPACE OAR INSTILLATION;  Surgeon: Lovie Arlyss CROME, MD;  Location: [facility];  Service: Urology;  Laterality: N/A;   Social History   Occupational History   Not on file  Tobacco Use   Smoking status: Former    Current packs/day: 0.00    Average packs/day: 1 pack/day for  20.0 years (20.0 ttl pk-yrs)    Types: Cigarettes    Start date: 08/22/1961    Quit date: 08/22/1981    Years since quitting: 42.2   Smokeless tobacco: Never  Vaping Use   Vaping status: Never Used  Substance and Sexual Activity   Alcohol  use: No    Alcohol /week: 0.0 standard drinks of alcohol    Drug use: No   Sexual activity: Never

## 2023-10-29 NOTE — Progress Notes (Unsigned)
 Subjective:   Patient ID: Andrew Larsen, male    DOB: 03/24/1941, 83 y.o.   MRN: [id number]  No chief complaint on file.   HPI:  Andrew Larsen is a 83 y.o. male with Enterobacter ankle osteomyelitis and Enterobacter bacteremia with plan for 6 weeks of Cefepime  through 10/09/23 and last seen by Dr. Luiz on 09/26/23 with slight dehiscence of the wound with significant swelling of the left lower extremity with pitting edema of the foot. Continued on cefepime  through 10/09/23 and continued wound care per Dr. Harden. Plan to transition to oral levofloxacin  750 mg daily through 10/16/23 and follow up at end of June.  Labwork from 10/07/23 with normal renal function and WBC count of 6,000. Sedimentation rate was 22. Seen by Dr. Harden with wound being well approximated and remaining sutures removed with a Vashe was dressing applied.  Here today for follow up.    Allergies  Allergen Reactions   Metformin And Related Shortness Of Breath   Pollen Extract Itching and Other (See Comments)    Some wheezing, itchy eyes, runny nose   Simbrinza [Brinzolamide-Brimonidine ] Other (See Comments)    Drainage, redness to eyes      Outpatient Medications Prior to Visit  Medication Sig Dispense Refill   aspirin  EC 81 MG tablet Take 1 tablet (81 mg total) by mouth daily. Swallow whole.     clopidogrel  (PLAVIX ) 75 MG tablet Take 1 tablet (75 mg total) by mouth daily. 90 tablet 3   Continuous Blood Gluc Sensor (FREESTYLE LIBRE 2 SENSOR) MISC Inject 1 Device into the skin every 14 (fourteen) days.     docusate sodium  (COLACE) 100 MG capsule Take 1 capsule (100 mg total) by mouth 2 (two) times daily as needed for mild constipation.     dorzolamide  (TRUSOPT ) 2 % ophthalmic solution Place 1 drop into the right eye 2 (two) times daily.     ezetimibe  (ZETIA ) 10 MG tablet TAKE 1 TABLET(10 MG) BY MOUTH DAILY AFTER SUPPER 90 tablet 1   furosemide  (LASIX ) 40 MG tablet Take 1 tablet (40 mg total) by mouth daily. 30 tablet 0    gabapentin  (NEURONTIN ) 300 MG capsule Take 2 capsules (600 mg total) by mouth 3 (three) times daily. 90 capsule 0   heparin  5000 UNIT/ML injection Use 3cc intravenously every 12 hours for PICC line     latanoprost  (XALATAN ) 0.005 % ophthalmic solution Place 1 drop into both eyes at bedtime.     levofloxacin  (LEVAQUIN ) 750 MG tablet Take 1 tablet (750 mg total) by mouth daily. To start on 6/12 7 tablet 0   losartan  (COZAAR ) 25 MG tablet TAKE 1 TABLET(25 MG) BY MOUTH DAILY 30 tablet 2   melatonin 5 MG TABS Take 5 mg by mouth at bedtime. (Patient not taking: Reported on 10/24/2023)     metoprolol  tartrate (LOPRESSOR ) 25 MG tablet Take 1 tablet (25 mg total) by mouth 2 (two) times daily. 60 tablet 0   Multiple Vitamin (MULTIVITAMIN WITH MINERALS) TABS tablet Take 1 tablet by mouth daily with breakfast.     NOVOLIN 70/30 (70-30) 100 UNIT/ML injection Inject 50 Units into the skin in the morning and at bedtime.     oxyCODONE  (OXY IR/ROXICODONE ) 5 MG immediate release tablet Take 2 tablets (10 mg total) by mouth 2 (two) times daily. (Patient taking differently: Take 5 mg by mouth daily. As needed.) 30 tablet 0   polyethylene glycol (MIRALAX  / GLYCOLAX ) 17 g packet Take 17 g by mouth daily  as needed.     PRESCRIPTION MEDICATION CPAP- At bedtime     rosuvastatin  (CRESTOR ) 10 MG tablet Take 10 mg by mouth daily.     senna (SENOKOT) 8.6 MG TABS tablet Take 2 tablets by mouth at bedtime.     tamsulosin  (FLOMAX ) 0.4 MG CAPS capsule Take 0.4 mg by mouth daily.     timolol  (TIMOPTIC ) 0.5 % ophthalmic solution Place 1 drop into the right eye 2 (two) times daily.     tirzepatide (MOUNJARO) 2.5 MG/0.5ML Pen  (Patient not taking: Reported on 10/24/2023)     traZODone  (DESYREL ) 50 MG tablet Take 50 mg by mouth at bedtime.     zolpidem  (AMBIEN ) 5 MG tablet Take 1 tablet (5 mg total) by mouth at bedtime as needed for sleep. (Patient not taking: Reported on 10/24/2023) 30 tablet 0   No facility-administered medications  prior to visit.     Past Medical History:  Diagnosis Date   Arthritis    CHF (congestive heart failure) (HCC)    per pt's daughter   CKD (chronic kidney disease)    CKD stage III (01/2018)   Daytime somnolence    Diabetes mellitus without complication (HCC)    Fracture    right fibula/wears boot cast   GERD (gastroesophageal reflux disease)    Glaucoma    Hypercholesteremia    Hyperlipidemia    Hypertension    Inclusion body myositis    diagnosed 2020   Mold exposure    7 yrs ago   PAD (peripheral artery disease) (HCC)    Prostate cancer (HCC)    Sleep apnea    uses cpap   Uses walker    Wears glasses      Past Surgical History:  Procedure Laterality Date   ALLOGRAFT APPLICATION Left 08/27/2023   Procedure: APPLICATION, ALLOGRAFT, SKIN;  Surgeon: Beverley Evalene BIRCH, MD;  Location: [facility] OR;  Service: Orthopedics;  Laterality: Left;   APPLICATION OF WOUND VAC Left 08/27/2023   Procedure: APPLICATION, WOUND VAC;  Surgeon: Beverley Evalene BIRCH, MD;  Location: [facility] OR;  Service: Orthopedics;  Laterality: Left;   GOLD SEED IMPLANT N/A 01/08/2023   Procedure: GOLD SEED IMPLANT;  Surgeon: Lovie Arlyss CROME, MD;  Location[facility];  Service: Urology;  Laterality: N/A;  30 MINUTES   HARDWARE REMOVAL Left 08/27/2023   Procedure: REMOVAL, HARDWARE;  Surgeon: Beverley Evalene BIRCH, MD;  Location: [facility] OR;  Service: Orthopedics;  Laterality: Left;   INCISION AND DRAINAGE OF DEEP ABSCESS, ANKLE Left 08/27/2023   Procedure: INCISION AND DRAINAGE OF DEEP ABSCESS, ANKLE;  Surgeon: Beverley Evalene BIRCH, MD;  Location: [facility] OR;  Service: Orthopedics;  Laterality: Left;   LOWER EXTREMITY ANGIOGRAPHY N/A 06/12/2016   Procedure: Lower Extremity Angiography;  Surgeon: Gordy Bergamo, MD;  Location: [facility] INVASIVE CV LAB;  Service: Cardiovascular;  Laterality: N/A;   MUSCLE BIOPSY Right 05/06/2018   Procedure: right vastus lateralis muscle biopsy;  Surgeon: Louis Shove, MD;  Location: [facility];  Service:  Neurosurgery;  Laterality: Right;   ORIF ANKLE FRACTURE Right 10/02/2012   Procedure: OPEN REDUCTION INTERNAL FIXATION (ORIF) ANKLE FRACTURE;  Surgeon: Cordella Glendia Hutchinson, MD;  Location: [facility]S;  Service: Orthopedics;  Laterality: Right;   ORIF ANKLE FRACTURE Left 07/21/2023   Procedure: OPEN REDUCTION INTERNAL FIXATION (ORIF) ANKLE FRACTURE;  Surgeon: Beverley Evalene BIRCH, MD;  Location: [facility];  Service: Orthopedics;  Laterality: Left;   PERIPHERAL VASCULAR ATHERECTOMY Left 06/12/2016   Procedure: Peripheral Vascular Atherectomy-Left Popliteal;  Surgeon: Gordy Bergamo, MD;  Location: [facility] INVASIVE CV LAB;  Service: Cardiovascular;  Laterality: Left;   PERIPHERAL VASCULAR INTERVENTION Left 06/12/2016   Procedure: Peripheral Vascular Intervention- DCB Left Popliteal;  Surgeon: Gordy Bergamo, MD;  Location: [facility] INVASIVE CV LAB;  Service: Cardiovascular;  Laterality: Left;   PROSTATE BIOPSY     ROTATION FLAP Left 08/27/2023   Procedure: CREATION, FLAP, ROTATION;  Surgeon: Beverley Evalene BIRCH, MD;  Location: [facility] OR;  Service: Orthopedics;  Laterality: Left;   SPACE OAR INSTILLATION N/A 01/08/2023   Procedure: SPACE OAR INSTILLATION;  Surgeon: Lovie Arlyss CROME, MD;  Location: [name];  Service: Urology;  Laterality: N/A;       Review of Systems  Objective:   There were no vitals taken for this visit. Nursing note and vital signs reviewed.  Physical Exam      09/26/2023    2:40 PM 09/26/2023    2:21 PM 10/31/2022    3:12 PM  Depression screen PHQ 2/9  Decreased Interest  1 1  Down, Depressed, Hopeless 0 1 1  PHQ - 2 Score 0 2 2  Altered sleeping 0 0   Tired, decreased energy 0 0   Change in appetite 0 0   Feeling bad or failure about yourself  0 0   Trouble concentrating 0 0   Moving slowly or fidgety/restless 0 0   Suicidal thoughts 0 0   PHQ-9 Score 0 2   Difficult doing work/chores Not difficult at all Not difficult at all      Assessment & Plan:    Patient Active Problem  List   Diagnosis Date Noted   Bacteremia due to Enterobacter species 08/28/2023   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) 08/27/2023   ILD (interstitial lung disease) (HCC) 07/26/2023   PAD (peripheral artery disease) (HCC) 07/23/2023   OSA (obstructive sleep apnea) 07/23/2023   BPH (benign prostatic hyperplasia) 07/23/2023   Trimalleolar fracture of ankle, closed, left, initial encounter 07/20/2023   Malignant neoplasm of prostate (HCC) 11/04/2022   Complicated UTI (urinary tract infection) 09/06/2021   Cough 01/07/2019   Labile blood glucose    SOB (shortness of breath)    Urinary frequency    Hypoalbuminemia due to protein-calorie malnutrition (HCC)    Stage 3a chronic kidney disease (CKD) (HCC)    Physical debility 12/01/2018   Pressure injury of skin 11/27/2018   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   DMII (diabetes mellitus, type 2) (HCC) 11/03/2018   Essential hypertension 11/03/2018   Claudication in peripheral vascular disease (HCC) 06/10/2016   Morbid obesity (HCC) 09/21/2015   Upper airway cough syndrome 09/20/2015   Cough variant asthma 09/20/2015     Problem List Items Addressed This Visit   None    I am having Andrew Larsen maintain his tamsulosin , furosemide , gabapentin , metoprolol  tartrate, timolol , losartan , PRESCRIPTION MEDICATION, FreeStyle Libre 2 Sensor, latanoprost , multivitamin with minerals, NovoLIN 70/30, rosuvastatin , clopidogrel , docusate sodium , polyethylene glycol, Mounjaro, senna, melatonin, dorzolamide , aspirin  EC, heparin , zolpidem , ezetimibe , levofloxacin , oxyCODONE , and traZODone .   No orders of the defined types were placed in this encounter.    Follow-up: No follow-ups on file. or sooner if needed.   Cathlyn July, MSN, FNP-C Nurse Practitioner [facility] Main number: [phone]

## 2023-10-30 ENCOUNTER — Telehealth: Payer: Self-pay | Admitting: Orthopedic Surgery

## 2023-10-30 ENCOUNTER — Other Ambulatory Visit: Payer: Self-pay | Admitting: Orthopedic Surgery

## 2023-10-30 ENCOUNTER — Other Ambulatory Visit: Payer: Self-pay

## 2023-10-30 ENCOUNTER — Encounter: Payer: Self-pay | Admitting: Family

## 2023-10-30 ENCOUNTER — Ambulatory Visit (INDEPENDENT_AMBULATORY_CARE_PROVIDER_SITE_OTHER): Admitting: Family

## 2023-10-30 VITALS — BP 123/74 | HR 71 | Temp 97.9°F

## 2023-10-30 DIAGNOSIS — M86462 Chronic osteomyelitis with draining sinus, left tibia and fibula: Secondary | ICD-10-CM | POA: Diagnosis not present

## 2023-10-30 DIAGNOSIS — B9689 Other specified bacterial agents as the cause of diseases classified elsewhere: Secondary | ICD-10-CM

## 2023-10-30 DIAGNOSIS — R7881 Bacteremia: Secondary | ICD-10-CM

## 2023-10-30 MED ORDER — OXYCODONE HCL 5 MG PO TABS: 5.0000 mg | ORAL_TABLET | Freq: Four times a day (QID) | ORAL | 0 refills | Status: DC | PRN

## 2023-10-30 NOTE — Assessment & Plan Note (Signed)
 Andrew Larsen has completed 6 weeks of IV Cefepime  and now at least 3 weeks of levofloxacin . Still has one area of dehisense as noted by Orthopedics but no evidence of infection at present. Discussed recommended plan of care to stop antibiotics with the completion of the levofloxacin  and continue with wound care per Orthopedics. Optimize protein intake and blood sugar control to reduce risk of complicated healing and further infection. Can follow up with ID as needed.

## 2023-10-30 NOTE — Telephone Encounter (Signed)
 Pt was in the office 10/28/2023 for  follow up of a left ankle I&D s/p ORIF fx and states that you advised you would refill Oxycodone  5 mg the last refill was 10/10/2023 #30 please advise.

## 2023-10-30 NOTE — Assessment & Plan Note (Signed)
 Completed 6 weeks of cefepime  and 3 weeks of levofloxacin . No additional treatment needed at this time.

## 2023-10-30 NOTE — Telephone Encounter (Signed)
 Patient called and said that Harden was going to call in some Oxycodone  for him and never did. [phone]

## 2023-10-30 NOTE — Patient Instructions (Signed)
 Nice to see you.  No further antibiotics.   Continue wound care.   Optimize protein intake.   Follow up with ID as needed.   Have a great day and stay safe!

## 2023-10-31 DIAGNOSIS — G7241 Inclusion body myositis [IBM]: Secondary | ICD-10-CM | POA: Diagnosis not present

## 2023-10-31 DIAGNOSIS — T8579XA Infection and inflammatory reaction due to other internal prosthetic devices, implants and grafts, initial encounter: Secondary | ICD-10-CM | POA: Diagnosis not present

## 2023-10-31 DIAGNOSIS — M8660 Other chronic osteomyelitis, unspecified site: Secondary | ICD-10-CM | POA: Diagnosis not present

## 2023-10-31 DIAGNOSIS — I130 Hypertensive heart and chronic kidney disease with heart failure and stage 1 through stage 4 chronic kidney disease, or unspecified chronic kidney disease: Secondary | ICD-10-CM | POA: Diagnosis not present

## 2023-10-31 DIAGNOSIS — E785 Hyperlipidemia, unspecified: Secondary | ICD-10-CM | POA: Diagnosis not present

## 2023-10-31 DIAGNOSIS — G4733 Obstructive sleep apnea (adult) (pediatric): Secondary | ICD-10-CM | POA: Diagnosis not present

## 2023-10-31 DIAGNOSIS — N1832 Chronic kidney disease, stage 3b: Secondary | ICD-10-CM | POA: Diagnosis not present

## 2023-10-31 DIAGNOSIS — I739 Peripheral vascular disease, unspecified: Secondary | ICD-10-CM | POA: Diagnosis not present

## 2023-10-31 DIAGNOSIS — E1169 Type 2 diabetes mellitus with other specified complication: Secondary | ICD-10-CM | POA: Diagnosis not present

## 2023-10-31 DIAGNOSIS — I5032 Chronic diastolic (congestive) heart failure: Secondary | ICD-10-CM | POA: Diagnosis not present

## 2023-10-31 DIAGNOSIS — L02416 Cutaneous abscess of left lower limb: Secondary | ICD-10-CM | POA: Diagnosis not present

## 2023-11-04 ENCOUNTER — Other Ambulatory Visit: Payer: Self-pay

## 2023-11-04 ENCOUNTER — Ambulatory Visit (HOSPITAL_COMMUNITY)
Admission: RE | Admit: 2023-11-04 | Discharge: 2023-11-04 | Disposition: A | Discharge status: Home / Self Care | Source: Ambulatory Visit | Attending: Vascular Surgery | Admitting: Vascular Surgery

## 2023-11-04 DIAGNOSIS — I739 Peripheral vascular disease, unspecified: Secondary | ICD-10-CM | POA: Insufficient documentation

## 2023-11-04 LAB — VAS US ABI WITH/WO TBI
Left ABI: 0.73
Right ABI: 0.72

## 2023-11-05 ENCOUNTER — Ambulatory Visit (INDEPENDENT_AMBULATORY_CARE_PROVIDER_SITE_OTHER): Admitting: Orthopedic Surgery

## 2023-11-05 ENCOUNTER — Other Ambulatory Visit (INDEPENDENT_AMBULATORY_CARE_PROVIDER_SITE_OTHER): Payer: Self-pay

## 2023-11-05 DIAGNOSIS — M86462 Chronic osteomyelitis with draining sinus, left tibia and fibula: Secondary | ICD-10-CM

## 2023-11-05 DIAGNOSIS — T8131XS Disruption of external operation (surgical) wound, not elsewhere classified, sequela: Secondary | ICD-10-CM | POA: Diagnosis not present

## 2023-11-05 DIAGNOSIS — I739 Peripheral vascular disease, unspecified: Secondary | ICD-10-CM

## 2023-11-06 ENCOUNTER — Encounter: Payer: Self-pay | Admitting: Orthopedic Surgery

## 2023-11-06 NOTE — Progress Notes (Unsigned)
 Office Note     CC: Osteomyelitis of the fibula Requesting Provider:  Valdemar Rocky SAUNDERS, NP  HPI: Andrew Larsen is a 83 y.o. (Dec 25, 1940) male presenting at the request of .Andrew Hamilton, MD for evaluation of osteomyelitis of the fibula with concern for peripheral arterial disease.  On exam, Andrew Larsen was doing well, accompanied by his daughter.  A native of [facility] [place]  veins were years ago.  He is retired from [employer], and collects pension.  Unfortunately, Denvil suffered a fracture of the left ankle months ago, which was treated with ORIF.  He had poor perfusion, therefore poor wound healing.  He has been taken back by Dr. Harden for limb salvage.  Dr. Harden recognize that wound healing was poor due to peripheral arterial disease, therefore he presents to our office for evaluation.  Patient is ambulatory using a walker, trying to keep the weight off his left foot at this time.  Vascular surgery history includes left-sided SFA stent by Dr. Ladona in 2018. Imaging also demonstrated severe atherosclerotic disease of the popliteal artery   Past Medical History:  Diagnosis Date   Arthritis    CHF (congestive heart failure) (HCC)    per pt's daughter   CKD (chronic kidney disease)    CKD stage III (01/2018)   Daytime somnolence    Diabetes mellitus without complication (HCC)    Fracture    right fibula/wears boot cast   GERD (gastroesophageal reflux disease)    Glaucoma    Hypercholesteremia    Hyperlipidemia    Hypertension    Inclusion body myositis    diagnosed 2020   Mold exposure    7 yrs ago   PAD (peripheral artery disease) (HCC)    Prostate cancer (HCC)    Sleep apnea    uses cpap   Uses walker    Wears glasses     Past Surgical History:  Procedure Laterality Date   ALLOGRAFT APPLICATION Left 08/27/2023   Procedure: APPLICATION, ALLOGRAFT, SKIN;  Surgeon: Beverley Evalene BIRCH, MD;  Location: [facility];  Service: Orthopedics;  Laterality: Left;   APPLICATION OF WOUND  VAC Left 08/27/2023   Procedure: APPLICATION, WOUND VAC;  Surgeon: Beverley Evalene BIRCH, MD;  Location: [facility];  Service: Orthopedics;  Laterality: Left;   GOLD SEED IMPLANT N/A 01/08/2023   Procedure: GOLD SEED IMPLANT;  Surgeon: Lovie Molly LITTIE, MD;  Location: [facility];  Service: Urology;  Laterality: N/A;  30 MINUTES   HARDWARE REMOVAL Left 08/27/2023   Procedure: REMOVAL, HARDWARE;  Surgeon: Beverley Evalene BIRCH, MD;  Location: [facility] OR;  Service: Orthopedics;  Laterality: Left;   INCISION AND DRAINAGE OF DEEP ABSCESS, ANKLE Left 08/27/2023   Procedure: INCISION AND DRAINAGE OF DEEP ABSCESS, ANKLE;  Surgeon: Beverley Evalene BIRCH, MD;  Location: [facility];  Service: Orthopedics;  Laterality: Left;   LOWER EXTREMITY ANGIOGRAPHY N/A 06/12/2016   Procedure: Lower Extremity Angiography;  Surgeon: Gordy Ladona, MD;  Location: [facility] INVASIVE CV LAB;  Service: Cardiovascular;  Laterality: N/A;   MUSCLE BIOPSY Right 05/06/2018   Procedure: right vastus lateralis muscle biopsy;  Surgeon: Louis Shove, MD;  Location: [facility] OR;  Service: Neurosurgery;  Laterality: Right;   ORIF ANKLE FRACTURE Right 10/02/2012   Procedure: OPEN REDUCTION INTERNAL FIXATION (ORIF) ANKLE FRACTURE;  Surgeon: Cordella Larsen Hutchinson, MD;  Location: [facility]S;  Service: Orthopedics;  Laterality: Right;   ORIF ANKLE FRACTURE Left 07/21/2023   Procedure: OPEN REDUCTION INTERNAL FIXATION (ORIF) ANKLE FRACTURE;  Surgeon: Beverley,  Evalene BIRCH, MD;  Location: [facility];  Service: Orthopedics;  Laterality: Left;   PERIPHERAL VASCULAR ATHERECTOMY Left 06/12/2016   Procedure: Peripheral Vascular Atherectomy-Left Popliteal;  Surgeon: Gordy Bergamo, MD;  Location: [facility] INVASIVE CV LAB;  Service: Cardiovascular;  Laterality: Left;   PERIPHERAL VASCULAR INTERVENTION Left 06/12/2016   Procedure: Peripheral Vascular Intervention- DCB Left Popliteal;  Surgeon: Gordy Bergamo, MD;  Location: [facility] INVASIVE CV LAB;  Service: Cardiovascular;  Laterality: Left;   PROSTATE BIOPSY      ROTATION FLAP Left 08/27/2023   Procedure: CREATION, FLAP, ROTATION;  Surgeon: Beverley Evalene BIRCH, MD;  Location: [facility];  Service: Orthopedics;  Laterality: Left;   SPACE OAR INSTILLATION N/A 01/08/2023   Procedure: SPACE OAR INSTILLATION;  Surgeon: Lovie Arlyss CROME, MD;  Location: [facility];  Service: Urology;  Laterality: N/A;    Social History   Socioeconomic History   Marital status: Widowed    Spouse name: Not on file   Number of children: 1   Years of education: Not on file   Highest education level: Not on file  Occupational History   Not on file  Tobacco Use   Smoking status: Former    Current packs/day: 0.00    Average packs/day: 1 pack/day for 20.0 years (20.0 ttl pk-yrs)    Types: Cigarettes    Start date: 08/22/1961    Quit date: 08/22/1981    Years since quitting: 42.2   Smokeless tobacco: Never  Vaping Use   Vaping status: Never Used  Substance and Sexual Activity   Alcohol  use: No    Alcohol /week: 0.0 standard drinks of alcohol    Drug use: No   Sexual activity: Never  Other Topics Concern   Not on file  Social History Narrative   ** Merged History Encounter **       Social Drivers of Health   Financial Resource Strain: Not on file  Food Insecurity: No Food Insecurity (08/27/2023)   Hunger Vital Sign    Worried About Running Out of Food in the Last Year: Never true    Ran Out of Food in the Last Year: Never true  Transportation Needs: No Transportation Needs (08/27/2023)   PRAPARE - Administrator, Civil Service (Medical): No    Lack of Transportation (Non-Medical): No  Physical Activity: Not on file  Stress: Not on file  Social Connections: Moderately Integrated (08/27/2023)   Social Connection and Isolation Panel    Frequency of Communication with Friends and Family: More than three times a week    Frequency of Social Gatherings with Friends and Family: More than three times a week    Attends Religious Services: More than 4  times per year    Active Member of [employer] or Organizations: Yes    Attends Banker Meetings: More than 4 times per year    Marital Status: Widowed  Intimate Partner Violence: Not At Risk (08/27/2023)   Humiliation, Afraid, Rape, and Kick questionnaire    Fear of Current or Ex-Partner: No    Emotionally Abused: No    Physically Abused: No    Sexually Abused: No   Family History  Problem Relation Age of Onset   Heart disease Father    Prostate cancer Brother    Cancer Brother    Diabetes Brother    Colon cancer Neg Hx    Sleep apnea Neg Hx     Current Outpatient Medications  Medication Sig Dispense Refill   aspirin  EC 81  MG tablet Take 1 tablet (81 mg total) by mouth daily. Swallow whole.     clopidogrel  (PLAVIX ) 75 MG tablet Take 1 tablet (75 mg total) by mouth daily. 90 tablet 3   Continuous Blood Gluc Sensor (FREESTYLE LIBRE 2 SENSOR) MISC Inject 1 Device into the skin every 14 (fourteen) days.     Continuous Glucose Receiver (FREESTYLE LIBRE 2 READER) DEVI USE UNDER THE SKIN ONCE DAILY AS DIRECTED; Duration: 90     docusate sodium  (COLACE) 100 MG capsule Take 1 capsule (100 mg total) by mouth 2 (two) times daily as needed for mild constipation.     dorzolamide  (TRUSOPT ) 2 % ophthalmic solution Place 1 drop into the right eye 2 (two) times daily.     ezetimibe  (ZETIA ) 10 MG tablet TAKE 1 TABLET(10 MG) BY MOUTH DAILY AFTER SUPPER 90 tablet 1   furosemide  (LASIX ) 40 MG tablet Take 1 tablet (40 mg total) by mouth daily. 30 tablet 0   gabapentin  (NEURONTIN ) 300 MG capsule Take 2 capsules (600 mg total) by mouth 3 (three) times daily. 90 capsule 0   heparin  5000 UNIT/ML injection Use 3cc intravenously every 12 hours for PICC line     latanoprost  (XALATAN ) 0.005 % ophthalmic solution Place 1 drop into both eyes at bedtime.     levofloxacin  (LEVAQUIN ) 750 MG tablet Take 1 tablet (750 mg total) by mouth daily. To start on 6/12 (Patient not taking: Reported on 10/30/2023) 7 tablet  0   losartan  (COZAAR ) 25 MG tablet TAKE 1 TABLET(25 MG) BY MOUTH DAILY 30 tablet 2   melatonin 5 MG TABS Take 5 mg by mouth at bedtime. (Patient not taking: Reported on 10/30/2023)     metoprolol  tartrate (LOPRESSOR ) 25 MG tablet Take 1 tablet (25 mg total) by mouth 2 (two) times daily. 60 tablet 0   Multiple Vitamin (MULTIVITAMIN WITH MINERALS) TABS tablet Take 1 tablet by mouth daily with breakfast.     NOVOLIN 70/30 (70-30) 100 UNIT/ML injection Inject 50 Units into the skin in the morning and at bedtime.     oxyCODONE  (OXY IR/ROXICODONE ) 5 MG immediate release tablet Take 1 tablet (5 mg total) by mouth every 6 (six) hours as needed for severe pain (pain score 7-10). 30 tablet 0   polyethylene glycol (MIRALAX  / GLYCOLAX ) 17 g packet Take 17 g by mouth daily as needed.     PRESCRIPTION MEDICATION CPAP- At bedtime     rosuvastatin  (CRESTOR ) 10 MG tablet Take 10 mg by mouth daily.     senna (SENOKOT) 8.6 MG TABS tablet Take 2 tablets by mouth at bedtime.     tamsulosin  (FLOMAX ) 0.4 MG CAPS capsule Take 0.4 mg by mouth daily.     timolol  (TIMOPTIC ) 0.5 % ophthalmic solution Place 1 drop into the right eye 2 (two) times daily.     tirzepatide (MOUNJARO) 2.5 MG/0.5ML Pen  (Patient not taking: Reported on 10/30/2023)     traZODone  (DESYREL ) 50 MG tablet Take 50 mg by mouth at bedtime.     zolpidem  (AMBIEN ) 5 MG tablet Take 1 tablet (5 mg total) by mouth at bedtime as needed for sleep. (Patient not taking: Reported on 10/30/2023) 30 tablet 0   No current facility-administered medications for this visit.    Allergies  Allergen Reactions   Metformin And Related Shortness Of Breath   Pollen Extract Itching and Other (See Comments)    Some wheezing, itchy eyes, runny nose   Simbrinza [Brinzolamide-Brimonidine ] Other (See Comments)    Drainage,  redness to eyes     REVIEW OF SYSTEMS:  [X]  denotes positive finding, [ ]  denotes negative finding Cardiac  Comments:  Chest pain or chest pressure:     Shortness of breath upon exertion:    Short of breath when lying flat:    Irregular heart rhythm:        Vascular    Pain in calf, thigh, or hip brought on by ambulation:    Pain in feet at night that wakes you up from your sleep:     Blood clot in your veins:    Leg swelling:         Pulmonary    Oxygen  at home:    Productive cough:     Wheezing:         Neurologic    Sudden weakness in arms or legs:     Sudden numbness in arms or legs:     Sudden onset of difficulty speaking or slurred speech:    Temporary loss of vision in one eye:     Problems with dizziness:         Gastrointestinal    Blood in stool:     Vomited blood:         Genitourinary    Burning when urinating:     Blood in urine:        Psychiatric    Major depression:         Hematologic    Bleeding problems:    Problems with blood clotting too easily:        Skin    Rashes or ulcers:        Constitutional    Fever or chills:      PHYSICAL EXAMINATION:  There were no vitals filed for this visit.  General:  WDWN in NAD; vital signs documented above Gait: Not observed HENT: WNL, normocephalic Pulmonary: normal non-labored breathing , without wheezing Cardiac: regular HR Abdomen: soft, NT, no masses Skin: without rashes Vascular Exam/Pulses:  Right Left  Radial 2+ (normal) 2+ (normal)  Ulnar    Femoral 2+ (normal) 2+ (normal)  Popliteal    DP absent absent  PT     Extremities: without ischemic changes, without Gangrene , without cellulitis; with open wounds;  Musculoskeletal: no muscle wasting or atrophy  Neurologic: A&O X 3;  No focal weakness or paresthesias are detected Psychiatric:  The pt has Normal affect.   Non-Invasive Vascular Imaging:    ABI Findings:  +---------+------------------+-----+-------------------+--------+  Right   Rt Pressure (mmHg)IndexWaveform           Comment   +---------+------------------+-----+-------------------+--------+  Brachial 156                                                  +---------+------------------+-----+-------------------+--------+  PTA     112               0.72 dampened monophasic          +---------+------------------+-----+-------------------+--------+  DP      101               0.65 monophasic                   +---------+------------------+-----+-------------------+--------+  Great Toe  Abnormal                     +---------+------------------+-----+-------------------+--------+   +---------+------------------+-----+-------------------+-------+  Left    Lt Pressure (mmHg)IndexWaveform           Comment  +---------+------------------+-----+-------------------+-------+  Brachial 147                                                +---------+------------------+-----+-------------------+-------+  PTA     114               0.73 dampened monophasic         +---------+------------------+-----+-------------------+-------+  DP      0                 0.00 absent                      +---------+------------------+-----+-------------------+-------+  Great Toe                       Absent                      +---------+------------------+-----+-------------------+-------+   +-------+-----------+-----------+------------+------------+  ABI/TBIToday's ABIToday's TBIPrevious ABIPrevious TBI  +-------+-----------+-----------+------------+------------+  Right 0.72       N/A        0.86        0.51          +-------+-----------+-----------+------------+------------+  Left  0.73       N/A        0.71        0.27          +-------+-----------+-----------+------------+------------+      ASSESSMENT/PLAN: THORVALD ORSINO is a 83 y.o. male presenting with chronic osteomyelitis of the left fibula with draining sinus.  He has had previous history of open reduction internal fixation for left ankle fracture with subsequent debridement and  tissue grafting.  This has struggled to heal.  On physical exam, he had nonpalpable pulses in the foot. ABI was reviewed demonstrating dampened, monophasic waveforms bilaterally. Prior angiogram was also reviewed demonstrating severe popliteal disease.  He also had interval SFA stenting.  I had a long conversation with Nancyann regarding the above.  He would benefit from left lower extremity angiogram with possible intervention in an effort to define and improve distal perfusion for wound healing.  I think this will likely require intervention to the superficial femoral artery, popliteal artery, tibial vessels.  After discussing risks and benefits, he elected to proceed.  I discussed the above with Dr. Harden.  He feels like he can manage the chronic osteomyelitis and promote wound healing should perfusion improved.   Fonda FORBES Rim, MD [facility] [phone] Total time of patient care including pre-visit research, consultation, and documentation greater than 60 minutes

## 2023-11-06 NOTE — Progress Notes (Signed)
 Office Visit Note   Patient: Andrew Larsen           Date of Birth: May 25, 1940           MRN: [id number] Visit Date: 11/05/2023              Requested by: Larnell Hamilton, MD [address] [place],  [place] [address] PCP: Larnell Hamilton, MD  Chief Complaint  Patient presents with   Left Ankle - Wound Check    S/p ORIF w/ Dr. Beverley      HPI: Patient is an 83 year old gentleman who is seen in follow-up for peripheral artery disease wound dehiscence of the left fibular wound with deep infection.  Patient is status post ankle-brachial indices.  Assessment & Plan: Visit Diagnoses:  1. Chronic osteomyelitis of left fibula with draining sinus (HCC)   2. Dehiscence of operative wound, sequela   3. PAD (peripheral artery disease) (HCC)     Plan: Patient is to follow-up with vascular vein surgery on Thursday.  Anticipate with vascular intervention wound salvage would be available with debridement of the fibula and placement of tissue graft.  Follow-Up Instructions: No follow-ups on file.   Ortho Exam  Patient is alert, oriented, no adenopathy, well-dressed, normal affect, normal respiratory effort. Examination patient has progressive dehiscence of the fibular wound.  There is a black eschar within the wound bed the ulcer now measures 2 x 5 cm and 1 cm deep.  Patient's ankle-brachial indices shows no dorsalis pedis flow with an ankle-brachial indices of 0.27 on the left.    Imaging: No results found. No images are attached to the encounter.  Labs: Lab Results  Component Value Date   HGBA1C 7.3 (H) 07/21/2023   HGBA1C 8.4 (H) 09/02/2021   HGBA1C 7.3 (H) 11/08/2018   ESRSEDRATE 22 10/07/2023   ESRSEDRATE 19 09/24/2023   ESRSEDRATE 34 09/16/2023   CRP 4.8 (H) 08/28/2023   CRP <0.8 11/27/2018   CRP <0.8 11/25/2018   REPTSTATUS 08/30/2023 FINAL 08/27/2023   GRAMSTAIN NO WBC SEEN NO ORGANISMS SEEN  08/27/2023   CULT ENTEROBACTER CLOACAE (A) 08/27/2023   LABORGA  ENTEROBACTER CLOACAE 08/27/2023     Lab Results  Component Value Date   ALBUMIN 3.3 (L) 08/29/2023   ALBUMIN 3.1 (L) 08/28/2023   ALBUMIN 3.5 09/01/2021    Lab Results  Component Value Date   MG 2.2 08/29/2023   MG 1.9 09/05/2021   MG 1.7 09/03/2021   No results found for: VD25OH  No results found for: PREALBUMIN    Latest Ref Rng & Units 10/07/2023   12:00 AM 09/24/2023   12:00 AM 09/16/2023   12:00 AM  CBC EXTENDED  WBC  6.0     6.0     6.0      RBC 3.87 - 5.11 4.27     4.12     4.03      Hemoglobin 13.5 - 17.5 13.1     12.7     12.4      HCT 41 - 53 41     39     39      Platelets 150 - 400 K/uL 166     164     196      NEUT#  3,204.00     3,096.00     3,066.00         This result is from an external source.     There is no height or weight on file  to calculate BMI.  Orders:  Orders Placed This Encounter  Procedures   XR Ankle Complete Left   No orders of the defined types were placed in this encounter.    Procedures: No procedures performed  Clinical Data: No additional findings.  ROS:  All other systems negative, except as noted in the HPI. Review of Systems  Objective: Vital Signs: There were no vitals taken for this visit.  Specialty Comments:  No specialty comments available.  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) 08/27/2023   ILD (interstitial lung disease) (HCC) 07/26/2023   PAD (peripheral artery disease) (HCC) 07/23/2023   OSA (obstructive sleep apnea) 07/23/2023   BPH (benign prostatic hyperplasia) 07/23/2023   Trimalleolar fracture of ankle, closed, left, initial encounter 07/20/2023   Malignant neoplasm of prostate (HCC) 11/04/2022   Complicated UTI (urinary tract infection) 09/06/2021   Cough 01/07/2019    Labile blood glucose    SOB (shortness of breath)    Urinary frequency    Hypoalbuminemia due to protein-calorie malnutrition (HCC)    Stage 3a chronic kidney disease (CKD) (HCC)    Physical debility 12/01/2018   Pressure injury of skin 11/27/2018   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   DMII (diabetes mellitus, type 2) (HCC) 11/03/2018   Essential hypertension 11/03/2018   Claudication in peripheral vascular disease (HCC) 06/10/2016   Morbid obesity (HCC) 09/21/2015   Upper airway cough syndrome 09/20/2015   Cough variant asthma 09/20/2015   Past Medical History:  Diagnosis Date   Arthritis    CHF (congestive heart failure) (HCC)    per pt's daughter   CKD (chronic kidney disease)    CKD stage III (01/2018)   Daytime somnolence    Diabetes mellitus without complication (HCC)    Fracture    right fibula/wears boot cast   GERD (gastroesophageal reflux disease)    Glaucoma    Hypercholesteremia    Hyperlipidemia    Hypertension    Inclusion body myositis    diagnosed 2020   Mold exposure    7 yrs ago   PAD (peripheral artery disease) (HCC)    Prostate cancer (HCC)    Sleep apnea    uses cpap   Uses walker    Wears glasses     Family History  Problem Relation Age of Onset   Heart disease Father    Prostate cancer Brother    Cancer Brother    Diabetes Brother    Colon cancer Neg Hx    Sleep apnea Neg Hx     Past Surgical History:  Procedure Laterality Date   ALLOGRAFT APPLICATION Left 08/27/2023   Procedure: APPLICATION, ALLOGRAFT, SKIN;  Surgeon: Beverley Evalene BIRCH, MD;  Location: [facility];  Service: Orthopedics;  Laterality: Left;   APPLICATION OF WOUND VAC Left 08/27/2023   Procedure: APPLICATION, WOUND VAC;  Surgeon: Beverley Evalene BIRCH, MD;  Location: [facility];  Service: Orthopedics;  Laterality: Left;   GOLD SEED IMPLANT N/A 01/08/2023   Procedure: GOLD SEED IMPLANT;  Surgeon: Lovie Arlyss CROME, MD;  Location: [facility];  Service: Urology;  Laterality:  N/A;  30 MINUTES   HARDWARE REMOVAL Left 08/27/2023   Procedure: REMOVAL, HARDWARE;  Surgeon: Beverley Evalene BIRCH, MD;  Location[facility] OR;  Service: Orthopedics;  Laterality: Left;   INCISION  AND DRAINAGE OF DEEP ABSCESS, ANKLE Left 08/27/2023   Procedure: INCISION AND DRAINAGE OF DEEP ABSCESS, ANKLE;  Surgeon: Beverley Evalene BIRCH, MD;  Location: [facility];  Service: Orthopedics;  Laterality: Left;   LOWER EXTREMITY ANGIOGRAPHY N/A 06/12/2016   Procedure: Lower Extremity Angiography;  Surgeon: Gordy Bergamo, MD;  Location: [facility] INVASIVE CV LAB;  Service: Cardiovascular;  Laterality: N/A;   MUSCLE BIOPSY Right 05/06/2018   Procedure: right vastus lateralis muscle biopsy;  Surgeon: Louis Shove, MD;  Location: [facility] OR;  Service: Neurosurgery;  Laterality: Right;   ORIF ANKLE FRACTURE Right 10/02/2012   Procedure: OPEN REDUCTION INTERNAL FIXATION (ORIF) ANKLE FRACTURE;  Surgeon: Cordella Glendia Hutchinson, MD;  Location: [facility];  Service: Orthopedics;  Laterality: Right;   ORIF ANKLE FRACTURE Left 07/21/2023   Procedure: OPEN REDUCTION INTERNAL FIXATION (ORIF) ANKLE FRACTURE;  Surgeon: Beverley Evalene BIRCH, MD;  Location: [facility];  Service: Orthopedics;  Laterality: Left;   PERIPHERAL VASCULAR ATHERECTOMY Left 06/12/2016   Procedure: Peripheral Vascular Atherectomy-Left Popliteal;  Surgeon: Gordy Bergamo, MD;  Location: [facility] INVASIVE CV LAB;  Service: Cardiovascular;  Laterality: Left;   PERIPHERAL VASCULAR INTERVENTION Left 06/12/2016   Procedure: Peripheral Vascular Intervention- DCB Left Popliteal;  Surgeon: Gordy Bergamo, MD;  Location: [facility] INVASIVE CV LAB;  Service: Cardiovascular;  Laterality: Left;   PROSTATE BIOPSY     ROTATION FLAP Left 08/27/2023   Procedure: CREATION, FLAP, ROTATION;  Surgeon: Beverley Evalene BIRCH, MD;  Location: [facility];  Service: Orthopedics;  Laterality: Left;   SPACE OAR INSTILLATION N/A 01/08/2023   Procedure: SPACE OAR INSTILLATION;  Surgeon: Lovie Arlyss CROME, MD;  Location: [facility];  Service:  Urology;  Laterality: N/A;   Social History   Occupational History   Not on file  Tobacco Use   Smoking status: Former    Current packs/day: 0.00    Average packs/day: 1 pack/day for 20.0 years (20.0 ttl pk-yrs)    Types: Cigarettes    Start date: 08/22/1961    Quit date: 08/22/1981    Years since quitting: 42.2   Smokeless tobacco: Never  Vaping Use   Vaping status: Never Used  Substance and Sexual Activity   Alcohol  use: No    Alcohol /week: 0.0 standard drinks of alcohol    Drug use: No   Sexual activity: Never

## 2023-11-07 ENCOUNTER — Encounter: Payer: Self-pay | Admitting: Vascular Surgery

## 2023-11-07 ENCOUNTER — Other Ambulatory Visit: Payer: Self-pay

## 2023-11-07 ENCOUNTER — Ambulatory Visit: Attending: Vascular Surgery | Admitting: Vascular Surgery

## 2023-11-07 VITALS — BP 144/76 | HR 79 | Temp 98.3°F | Resp 20 | Ht 71.0 in | Wt 250.0 lb

## 2023-11-07 DIAGNOSIS — T8189XA Other complications of procedures, not elsewhere classified, initial encounter: Secondary | ICD-10-CM | POA: Diagnosis not present

## 2023-11-07 DIAGNOSIS — I70245 Atherosclerosis of native arteries of left leg with ulceration of other part of foot: Secondary | ICD-10-CM | POA: Diagnosis not present

## 2023-11-09 DIAGNOSIS — T8579XA Infection and inflammatory reaction due to other internal prosthetic devices, implants and grafts, initial encounter: Secondary | ICD-10-CM | POA: Diagnosis not present

## 2023-11-11 DIAGNOSIS — T8579XA Infection and inflammatory reaction due to other internal prosthetic devices, implants and grafts, initial encounter: Secondary | ICD-10-CM | POA: Diagnosis not present

## 2023-11-13 ENCOUNTER — Ambulatory Visit (HOSPITAL_COMMUNITY)
Admission: RE | Admit: 2023-11-13 | Discharge: 2023-11-13 | Disposition: A | Discharge status: Home / Self Care | Attending: Vascular Surgery | Admitting: Vascular Surgery

## 2023-11-13 ENCOUNTER — Other Ambulatory Visit: Payer: Self-pay

## 2023-11-13 ENCOUNTER — Encounter (HOSPITAL_COMMUNITY)
Admission: RE | Disposition: A | Discharge status: Home / Self Care | Payer: Self-pay | Source: Home / Self Care | Attending: Vascular Surgery

## 2023-11-13 DIAGNOSIS — Z79899 Other long term (current) drug therapy: Secondary | ICD-10-CM | POA: Insufficient documentation

## 2023-11-13 DIAGNOSIS — I70243 Atherosclerosis of native arteries of left leg with ulceration of ankle: Secondary | ICD-10-CM | POA: Insufficient documentation

## 2023-11-13 DIAGNOSIS — M86462 Chronic osteomyelitis with draining sinus, left tibia and fibula: Secondary | ICD-10-CM | POA: Diagnosis not present

## 2023-11-13 DIAGNOSIS — E11622 Type 2 diabetes mellitus with other skin ulcer: Secondary | ICD-10-CM | POA: Insufficient documentation

## 2023-11-13 DIAGNOSIS — Z794 Long term (current) use of insulin: Secondary | ICD-10-CM | POA: Diagnosis not present

## 2023-11-13 DIAGNOSIS — M86662 Other chronic osteomyelitis, left tibia and fibula: Secondary | ICD-10-CM

## 2023-11-13 DIAGNOSIS — E1151 Type 2 diabetes mellitus with diabetic peripheral angiopathy without gangrene: Secondary | ICD-10-CM | POA: Insufficient documentation

## 2023-11-13 DIAGNOSIS — I70248 Atherosclerosis of native arteries of left leg with ulceration of other part of lower left leg: Secondary | ICD-10-CM | POA: Diagnosis not present

## 2023-11-13 DIAGNOSIS — I130 Hypertensive heart and chronic kidney disease with heart failure and stage 1 through stage 4 chronic kidney disease, or unspecified chronic kidney disease: Secondary | ICD-10-CM | POA: Diagnosis not present

## 2023-11-13 DIAGNOSIS — E1122 Type 2 diabetes mellitus with diabetic chronic kidney disease: Secondary | ICD-10-CM | POA: Insufficient documentation

## 2023-11-13 DIAGNOSIS — Z87891 Personal history of nicotine dependence: Secondary | ICD-10-CM | POA: Insufficient documentation

## 2023-11-13 DIAGNOSIS — N183 Chronic kidney disease, stage 3 unspecified: Secondary | ICD-10-CM | POA: Insufficient documentation

## 2023-11-13 DIAGNOSIS — I509 Heart failure, unspecified: Secondary | ICD-10-CM | POA: Diagnosis not present

## 2023-11-13 DIAGNOSIS — L97829 Non-pressure chronic ulcer of other part of left lower leg with unspecified severity: Secondary | ICD-10-CM | POA: Diagnosis not present

## 2023-11-13 DIAGNOSIS — L97329 Non-pressure chronic ulcer of left ankle with unspecified severity: Secondary | ICD-10-CM | POA: Insufficient documentation

## 2023-11-13 DIAGNOSIS — I70245 Atherosclerosis of native arteries of left leg with ulceration of other part of foot: Secondary | ICD-10-CM

## 2023-11-13 HISTORY — PX: ABDOMINAL AORTOGRAM: CATH118222

## 2023-11-13 HISTORY — PX: LOWER EXTREMITY INTERVENTION: CATH118252

## 2023-11-13 HISTORY — PX: LOWER EXTREMITY ANGIOGRAPHY: CATH118251

## 2023-11-13 LAB — POCT I-STAT, CHEM 8
BUN: 23 mg/dL (ref 8–23)
Calcium, Ion: 1.19 mmol/L (ref 1.15–1.40)
Chloride: 103 mmol/L (ref 98–111)
Creatinine, Ser: 1.3 mg/dL — ABNORMAL HIGH (ref 0.61–1.24)
Glucose, Bld: 98 mg/dL (ref 70–99)
HCT: 38 % — ABNORMAL LOW (ref 39.0–52.0)
Hemoglobin: 12.9 g/dL — ABNORMAL LOW (ref 13.0–17.0)
Potassium: 3.8 mmol/L (ref 3.5–5.1)
Sodium: 141 mmol/L (ref 135–145)
TCO2: 25 mmol/L (ref 22–32)

## 2023-11-13 LAB — GLUCOSE, CAPILLARY
Glucose-Capillary: 46 mg/dL — ABNORMAL LOW (ref 70–99)
Glucose-Capillary: 54 mg/dL — ABNORMAL LOW (ref 70–99)
Glucose-Capillary: 56 mg/dL — ABNORMAL LOW (ref 70–99)
Glucose-Capillary: 75 mg/dL (ref 70–99)

## 2023-11-13 SURGERY — ABDOMINAL AORTOGRAM
Anesthesia: LOCAL

## 2023-11-13 MED ORDER — LABETALOL HCL 5 MG/ML IV SOLN: 10.0000 mg | INTRAVENOUS | Status: DC | PRN

## 2023-11-13 MED ORDER — MIDAZOLAM HCL 2 MG/2ML IJ SOLN
INTRAMUSCULAR | Status: DC | PRN
Start: 2023-11-13 — End: 2023-11-13
  Administered 2023-11-13: 1 mg via INTRAVENOUS

## 2023-11-13 MED ORDER — MIDAZOLAM HCL 2 MG/2ML IJ SOLN
INTRAMUSCULAR | Status: AC
  Filled 2023-11-13: qty 2

## 2023-11-13 MED ORDER — IODIXANOL 320 MG/ML IV SOLN
INTRAVENOUS | Status: DC | PRN
  Administered 2023-11-13: 130 mL

## 2023-11-13 MED ORDER — HYDRALAZINE HCL 20 MG/ML IJ SOLN: 5.0000 mg | INTRAMUSCULAR | Status: DC | PRN

## 2023-11-13 MED ORDER — FENTANYL CITRATE (PF) 100 MCG/2ML IJ SOLN
INTRAMUSCULAR | Status: AC
  Filled 2023-11-13: qty 2

## 2023-11-13 MED ORDER — PROTAMINE SULFATE 10 MG/ML IV SOLN
INTRAVENOUS | Status: DC | PRN
  Administered 2023-11-13: 15 mg via INTRAVENOUS
  Administered 2023-11-13: 5 mg via INTRAVENOUS

## 2023-11-13 MED ORDER — HEPARIN SODIUM (PORCINE) 1000 UNIT/ML IJ SOLN
INTRAMUSCULAR | Status: AC
Start: 2023-11-13 — End: 2023-11-13
  Filled 2023-11-13: qty 10

## 2023-11-13 MED ORDER — ACETAMINOPHEN 325 MG PO TABS: 650.0000 mg | ORAL_TABLET | ORAL | Status: DC | PRN

## 2023-11-13 MED ORDER — SODIUM CHLORIDE 0.9 % IV SOLN: 250.0000 mL | INTRAVENOUS | Status: DC | PRN

## 2023-11-13 MED ORDER — ONDANSETRON HCL 4 MG/2ML IJ SOLN: 4.0000 mg | Freq: Four times a day (QID) | INTRAMUSCULAR | Status: DC | PRN

## 2023-11-13 MED ORDER — FENTANYL CITRATE (PF) 100 MCG/2ML IJ SOLN
INTRAMUSCULAR | Status: DC | PRN
  Administered 2023-11-13: 50 ug via INTRAVENOUS

## 2023-11-13 MED ORDER — ROSUVASTATIN CALCIUM 20 MG PO TABS: 20.0000 mg | ORAL_TABLET | Freq: Every day | ORAL | 11 refills | Status: AC

## 2023-11-13 MED ORDER — HEPARIN SODIUM (PORCINE) 1000 UNIT/ML IJ SOLN
INTRAMUSCULAR | Status: DC | PRN
Start: 2023-11-13 — End: 2023-11-13
  Administered 2023-11-13: 10000 [IU] via INTRAVENOUS
  Administered 2023-11-13: 3000 [IU] via INTRAVENOUS

## 2023-11-13 MED ORDER — LIDOCAINE HCL (PF) 1 % IJ SOLN
INTRAMUSCULAR | Status: DC | PRN
  Administered 2023-11-13: 10 mL

## 2023-11-13 MED ORDER — HEPARIN SODIUM (PORCINE) 1000 UNIT/ML IJ SOLN
INTRAMUSCULAR | Status: AC
  Filled 2023-11-13: qty 10

## 2023-11-13 MED ORDER — SODIUM CHLORIDE 0.9 % WEIGHT BASED INFUSION: 1.0000 mL/kg/h | INTRAVENOUS | Status: DC

## 2023-11-13 MED ORDER — LIDOCAINE HCL (PF) 1 % IJ SOLN
INTRAMUSCULAR | Status: AC
  Filled 2023-11-13: qty 30

## 2023-11-13 MED ORDER — SODIUM CHLORIDE 0.9% FLUSH: 3.0000 mL | INTRAVENOUS | Status: DC | PRN

## 2023-11-13 MED ORDER — PROTAMINE SULFATE 10 MG/ML IV SOLN
INTRAVENOUS | Status: AC
Start: 2023-11-13 — End: 2023-11-13
  Filled 2023-11-13: qty 5

## 2023-11-13 MED ORDER — HEPARIN (PORCINE) IN NACL 1000-0.9 UT/500ML-% IV SOLN
INTRAVENOUS | Status: DC | PRN
  Administered 2023-11-13 (×2): 500 mL

## 2023-11-13 MED ORDER — SODIUM CHLORIDE 0.9% FLUSH: 3.0000 mL | Freq: Two times a day (BID) | INTRAVENOUS | Status: DC

## 2023-11-13 MED ORDER — SODIUM CHLORIDE 0.9 % IV SOLN: INTRAVENOUS | Status: DC

## 2023-11-13 SURGICAL SUPPLY — 22 items
BALLOON STERLING OTW 3X150X150 (BALLOONS) IMPLANT
BALLOON STERLING OTW 5X150X150 (BALLOONS) IMPLANT
BALLOON STRLNG OTW 2.5X80X150 (BALLOONS) IMPLANT
CATH NAVICROSS ANGLED 135CM (MICROCATHETER) IMPLANT
CATH OMNI FLUSH 5F 65CM (CATHETERS) IMPLANT
CATH QUICKCROSS .035X135CM (MICROCATHETER) IMPLANT
COVER DOME SNAP 22 D (MISCELLANEOUS) IMPLANT
DCB RANGER 5.0X40 135 (BALLOONS) IMPLANT
DEVICE CLOSURE MYNXGRIP 6/7F (Vascular Products) IMPLANT
GLIDEWIRE ADV .035X260CM (WIRE) IMPLANT
KIT ENCORE 26 ADVANTAGE (KITS) IMPLANT
KIT MICROPUNCTURE NIT STIFF (SHEATH) IMPLANT
KIT SINGLE USE MANIFOLD (KITS) IMPLANT
SET ATX-X65L (MISCELLANEOUS) IMPLANT
SHEATH CATAPULT 6F 45 MP (SHEATH) IMPLANT
SHEATH PINNACLE 5F 10CM (SHEATH) IMPLANT
SHEATH PINNACLE 6F 10CM (SHEATH) IMPLANT
SHEATH PROBE COVER 6X72 (BAG) IMPLANT
STENT VIABAHN 6X150X120 (Permanent Stent) IMPLANT
TRAY PV CATH (CUSTOM PROCEDURE TRAY) ×2 IMPLANT
WIRE BENTSON .035X145CM (WIRE) IMPLANT
WIRE G V18X300CM (WIRE) IMPLANT

## 2023-11-13 NOTE — H&P (Signed)
 Office Note     CC: Osteomyelitis of the fibula Requesting Provider:  Lanis Andrew BRAVO, MD  HPI: Andrew Larsen is a 83 y.o. (11-Aug-1940) male presenting at the request of .Larnell Hamilton, MD for evaluation of osteomyelitis of the fibula with concern for peripheral arterial disease.  On exam, Andrew Larsen was doing well, accompanied by his daughter.  A native of [facility] [place]  veins were years ago.  He is retired from [employer], and collects pension.  Unfortunately, Andrew Larsen suffered a fracture of the left ankle months ago, which was treated with ORIF.  He had poor perfusion, therefore poor wound healing.  He has been taken back by Dr. Harden for limb salvage.  Dr. Harden recognize that wound healing was poor due to peripheral arterial disease, therefore he presents to our office for evaluation.  Patient is ambulatory using a walker, trying to keep the weight off his left foot at this time.  Vascular surgery history includes left-sided SFA stent by Dr. Ladona in 2018. Imaging also demonstrated severe atherosclerotic disease of the popliteal artery   Past Medical History:  Diagnosis Date   Arthritis    CHF (congestive heart failure) (HCC)    per pt's daughter   CKD (chronic kidney disease)    CKD stage III (01/2018)   Daytime somnolence    Diabetes mellitus without complication (HCC)    Fracture    right fibula/wears boot cast   GERD (gastroesophageal reflux disease)    Glaucoma    Hypercholesteremia    Hyperlipidemia    Hypertension    Inclusion body myositis    diagnosed 2020   Mold exposure    7 yrs ago   PAD (peripheral artery disease) (HCC)    Prostate cancer (HCC)    Sleep apnea    uses cpap   Uses walker    Wears glasses     Past Surgical History:  Procedure Laterality Date   ALLOGRAFT APPLICATION Left 08/27/2023   Procedure: APPLICATION, ALLOGRAFT, SKIN;  Surgeon: Beverley Evalene BIRCH, MD;  Location: [facility] OR;  Service: Orthopedics;  Laterality: Left;   APPLICATION OF  WOUND VAC Left 08/27/2023   Procedure: APPLICATION, WOUND VAC;  Surgeon: Beverley Evalene BIRCH, MD;  Location: [facility] OR;  Service: Orthopedics;  Laterality: Left;   GOLD SEED IMPLANT N/A 01/08/2023   Procedure: GOLD SEED IMPLANT;  Surgeon: Lovie Molly LITTIE, MD;  Location: [facility];  Service: Urology;  Laterality: N/A;  30 MINUTES   HARDWARE REMOVAL Left 08/27/2023   Procedure: REMOVAL, HARDWARE;  Surgeon: Beverley Evalene BIRCH, MD;  Location: [facility] OR;  Service: Orthopedics;  Laterality: Left;   INCISION AND DRAINAGE OF DEEP ABSCESS, ANKLE Left 08/27/2023   Procedure: INCISION AND DRAINAGE OF DEEP ABSCESS, ANKLE;  Surgeon: Beverley Evalene BIRCH, MD;  Location: [facility] OR;  Service: Orthopedics;  Laterality: Left;   LOWER EXTREMITY ANGIOGRAPHY N/A 06/12/2016   Procedure: Lower Extremity Angiography;  Surgeon: Gordy Ladona, MD;  Location: [facility] INVASIVE CV LAB;  Service: Cardiovascular;  Laterality: N/A;   MUSCLE BIOPSY Right 05/06/2018   Procedure: right vastus lateralis muscle biopsy;  Surgeon: Louis Shove, MD;  Location: [facility] OR;  Service: Neurosurgery;  Laterality: Right;   ORIF ANKLE FRACTURE Right 10/02/2012   Procedure: OPEN REDUCTION INTERNAL FIXATION (ORIF) ANKLE FRACTURE;  Surgeon: Cordella Hamilton Hutchinson, MD;  Location: [facility] ORS;  Service: Orthopedics;  Laterality: Right;   ORIF ANKLE FRACTURE Left 07/21/2023   Procedure: OPEN REDUCTION INTERNAL FIXATION (ORIF) ANKLE FRACTURE;  Surgeon: Beverley,  Evalene BIRCH, MD;  Location: [facility] ORS;  Service: Orthopedics;  Laterality: Left;   PERIPHERAL VASCULAR ATHERECTOMY Left 06/12/2016   Procedure: Peripheral Vascular Atherectomy-Left Popliteal;  Surgeon: Gordy Bergamo, MD;  Location: [facility] INVASIVE CV LAB;  Service: Cardiovascular;  Laterality: Left;   PERIPHERAL VASCULAR INTERVENTION Left 06/12/2016   Procedure: Peripheral Vascular Intervention- DCB Left Popliteal;  Surgeon: Gordy Bergamo, MD;  Location: [facility] INVASIVE CV LAB;  Service: Cardiovascular;  Laterality: Left;   PROSTATE BIOPSY      ROTATION FLAP Left 08/27/2023   Procedure: CREATION, FLAP, ROTATION;  Surgeon: Beverley Evalene BIRCH, MD;  Location: [facility] OR;  Service: Orthopedics;  Laterality: Left;   SPACE OAR INSTILLATION N/A 01/08/2023   Procedure: SPACE OAR INSTILLATION;  Surgeon: Lovie Arlyss CROME, MD;  Location: [facility];  Service: Urology;  Laterality: N/A;    Social History   Socioeconomic History   Marital status: Widowed    Spouse name: Not on file   Number of children: 1   Years of education: Not on file   Highest education level: Not on file  Occupational History   Not on file  Tobacco Use   Smoking status: Former    Current packs/day: 0.00    Average packs/day: 1 pack/day for 20.0 years (20.0 ttl pk-yrs)    Types: Cigarettes    Start date: 08/22/1961    Quit date: 08/22/1981    Years since quitting: 42.2   Smokeless tobacco: Never  Vaping Use   Vaping status: Never Used  Substance and Sexual Activity   Alcohol  use: No    Alcohol /week: 0.0 standard drinks of alcohol    Drug use: No   Sexual activity: Never  Other Topics Concern   Not on file  Social History Narrative   ** Merged History Encounter **       Social Drivers of Health   Financial Resource Strain: Not on file  Food Insecurity: No Food Insecurity (08/27/2023)   Hunger Vital Sign    Worried About Running Out of Food in the Last Year: Never true    Ran Out of Food in the Last Year: Never true  Transportation Needs: No Transportation Needs (08/27/2023)   PRAPARE - Administrator, Civil Service (Medical): No    Lack of Transportation (Non-Medical): No  Physical Activity: Not on file  Stress: Not on file  Social Connections: Moderately Integrated (08/27/2023)   Social Connection and Isolation Panel    Frequency of Communication with Friends and Family: More than three times a week    Frequency of Social Gatherings with Friends and Family: More than three times a week    Attends Religious Services: More than 4  times per year    Active Member of [employer] or Organizations: Yes    Attends Banker Meetings: More than 4 times per year    Marital Status: Widowed  Intimate Partner Violence: Not At Risk (08/27/2023)   Humiliation, Afraid, Rape, and Kick questionnaire    Fear of Current or Ex-Partner: No    Emotionally Abused: No    Physically Abused: No    Sexually Abused: No   Family History  Problem Relation Age of Onset   Heart disease Father    Prostate cancer Brother    Cancer Brother    Diabetes Brother    Colon cancer Neg Hx    Sleep apnea Neg Hx     Current Facility-Administered Medications  Medication Dose Route Frequency Provider Last Rate Last Admin  0.9 %  sodium chloride  infusion   Intravenous Continuous Quantarius Genrich E, MD 100 mL/hr at 11/13/23 0750 New Bag at 11/13/23 0750   0.9 %  sodium chloride  infusion  250 mL Intravenous PRN Karl Erway E, MD       0.9% sodium chloride  infusion  1 mL/kg/hr Intravenous Continuous Brainard Highfill E, MD       acetaminophen  (TYLENOL ) tablet 650 mg  650 mg Oral Q4H PRN Nikolus Marczak E, MD       fentaNYL  (SUBLIMAZE ) injection    PRN Sindi Beckworth E, MD   50 mcg at 11/13/23 0946   Heparin  (Porcine) in NaCl 1000-0.9 UT/500ML-% SOLN    PRN Markell Sciascia E, MD   500 mL at 11/13/23 0933   heparin  sodium (porcine) injection    PRN Edina Winningham E, MD   3,000 Units at 11/13/23 1036   hydrALAZINE  (APRESOLINE ) injection 5 mg  5 mg Intravenous Q20 Min PRN Raelan Burgoon E, MD       iodixanol  (VISIPAQUE ) 320 MG/ML injection    PRN Raychell Holcomb E, MD   130 mL at 11/13/23 1057   labetalol  (NORMODYNE ) injection 10 mg  10 mg Intravenous Q10 min PRN Solita Macadam E, MD       lidocaine  (PF) (XYLOCAINE ) 1 % injection    PRN Shubham Thackston E, MD   10 mL at 11/13/23 0948   midazolam  (VERSED ) injection    PRN Wyatte Dames E, MD   1 mg at 11/13/23 0945   ondansetron  (ZOFRAN ) injection 4 mg  4 mg Intravenous Q6H PRN Sreeja Spies E, MD        protamine  injection    PRN Karmyn Lowman E, MD   15 mg at 11/13/23 1100   sodium chloride  flush (NS) 0.9 % injection 3 mL  3 mL Intravenous Q12H Mehlani Blankenburg E, MD       sodium chloride  flush (NS) 0.9 % injection 3 mL  3 mL Intravenous PRN Shane Badeaux E, MD       Current Outpatient Medications  Medication Sig Dispense Refill   aspirin  EC 81 MG tablet Take 1 tablet (81 mg total) by mouth daily. Swallow whole.     clopidogrel  (PLAVIX ) 75 MG tablet Take 1 tablet (75 mg total) by mouth daily. 90 tablet 3   Continuous Blood Gluc Sensor (FREESTYLE LIBRE 2 SENSOR) MISC Inject 1 Device into the skin every 14 (fourteen) days.     Continuous Glucose Receiver (FREESTYLE LIBRE 2 READER) DEVI USE UNDER THE SKIN ONCE DAILY AS DIRECTED; Duration: 90     docusate sodium  (COLACE) 100 MG capsule Take 1 capsule (100 mg total) by mouth 2 (two) times daily as needed for mild constipation.     dorzolamide  (TRUSOPT ) 2 % ophthalmic solution Place 1 drop into the right eye 2 (two) times daily.     ezetimibe  (ZETIA ) 10 MG tablet TAKE 1 TABLET(10 MG) BY MOUTH DAILY AFTER SUPPER 90 tablet 1   furosemide  (LASIX ) 40 MG tablet Take 1 tablet (40 mg total) by mouth daily. 30 tablet 0   gabapentin  (NEURONTIN ) 300 MG capsule Take 2 capsules (600 mg total) by mouth 3 (three) times daily. 90 capsule 0   latanoprost  (XALATAN ) 0.005 % ophthalmic solution Place 1 drop into both eyes at bedtime.     losartan  (COZAAR ) 25 MG tablet TAKE 1 TABLET(25 MG) BY MOUTH DAILY 30 tablet 2   metoprolol  tartrate (LOPRESSOR ) 25 MG tablet Take 1 tablet (25 mg total) by  mouth 2 (two) times daily. 60 tablet 0   Multiple Vitamin (MULTIVITAMIN WITH MINERALS) TABS tablet Take 1 tablet by mouth daily with breakfast.     NOVOLIN 70/30 (70-30) 100 UNIT/ML injection Inject 50 Units into the skin in the morning and at bedtime.     oxyCODONE  (OXY IR/ROXICODONE ) 5 MG immediate release tablet Take 1 tablet (5 mg total) by mouth every 6 (six) hours as needed  for severe pain (pain score 7-10). 30 tablet 0   polyethylene glycol (MIRALAX  / GLYCOLAX ) 17 g packet Take 17 g by mouth daily as needed.     PRESCRIPTION MEDICATION CPAP- At bedtime     rosuvastatin  (CRESTOR ) 20 MG tablet Take 1 tablet (20 mg total) by mouth daily. 30 tablet 11   senna (SENOKOT) 8.6 MG TABS tablet Take 2 tablets by mouth at bedtime.     tamsulosin  (FLOMAX ) 0.4 MG CAPS capsule Take 0.4 mg by mouth daily.     timolol  (TIMOPTIC ) 0.5 % ophthalmic solution Place 1 drop into the right eye 2 (two) times daily.     zolpidem  (AMBIEN ) 5 MG tablet Take 5 mg by mouth at bedtime as needed for sleep.      Allergies  Allergen Reactions   Metformin And Related Shortness Of Breath   Pollen Extract Itching and Other (See Comments)    Some wheezing, itchy eyes, runny nose   Simbrinza [Brinzolamide-Brimonidine ] Other (See Comments)    Drainage, redness to eyes     REVIEW OF SYSTEMS:  [X]  denotes positive finding, [ ]  denotes negative finding Cardiac  Comments:  Chest pain or chest pressure:    Shortness of breath upon exertion:    Short of breath when lying flat:    Irregular heart rhythm:        Vascular    Pain in calf, thigh, or hip brought on by ambulation:    Pain in feet at night that wakes you up from your sleep:     Blood clot in your veins:    Leg swelling:         Pulmonary    Oxygen  at home:    Productive cough:     Wheezing:         Neurologic    Sudden weakness in arms or legs:     Sudden numbness in arms or legs:     Sudden onset of difficulty speaking or slurred speech:    Temporary loss of vision in one eye:     Problems with dizziness:         Gastrointestinal    Blood in stool:     Vomited blood:         Genitourinary    Burning when urinating:     Blood in urine:        Psychiatric    Major depression:         Hematologic    Bleeding problems:    Problems with blood clotting too easily:        Skin    Rashes or ulcers:         Constitutional    Fever or chills:      PHYSICAL EXAMINATION:  Vitals:   11/13/23 1200 11/13/23 1205 11/13/23 1215 11/13/23 1233  BP:  (!) 142/81  112/79  Pulse: 81 74 77   Resp:      Temp:      TempSrc:      SpO2: 98% 96% 97%   Weight:  Height:        General:  WDWN in NAD; vital signs documented above Gait: Not observed HENT: WNL, normocephalic Pulmonary: normal non-labored breathing , without wheezing Cardiac: regular HR Abdomen: soft, NT, no masses Skin: without rashes Vascular Exam/Pulses:  Right Left  Radial 2+ (normal) 2+ (normal)  Ulnar    Femoral 2+ (normal) 2+ (normal)  Popliteal    DP absent absent  PT     Extremities: without ischemic changes, without Gangrene , without cellulitis; with open wounds;  Musculoskeletal: no muscle wasting or atrophy  Neurologic: A&O X 3;  No focal weakness or paresthesias are detected Psychiatric:  The pt has Normal affect.   Non-Invasive Vascular Imaging:    ABI Findings:  +---------+------------------+-----+-------------------+--------+  Right   Rt Pressure (mmHg)IndexWaveform           Comment   +---------+------------------+-----+-------------------+--------+  Brachial 156                                                 +---------+------------------+-----+-------------------+--------+  PTA     112               0.72 dampened monophasic          +---------+------------------+-----+-------------------+--------+  DP      101               0.65 monophasic                   +---------+------------------+-----+-------------------+--------+  Great Toe                       Abnormal                     +---------+------------------+-----+-------------------+--------+   +---------+------------------+-----+-------------------+-------+  Left    Lt Pressure (mmHg)IndexWaveform           Comment  +---------+------------------+-----+-------------------+-------+  Brachial 147                                                 +---------+------------------+-----+-------------------+-------+  PTA     114               0.73 dampened monophasic         +---------+------------------+-----+-------------------+-------+  DP      0                 0.00 absent                      +---------+------------------+-----+-------------------+-------+  Great Toe                       Absent                      +---------+------------------+-----+-------------------+-------+   +-------+-----------+-----------+------------+------------+  ABI/TBIToday's ABIToday's TBIPrevious ABIPrevious TBI  +-------+-----------+-----------+------------+------------+  Right 0.72       N/A        0.86        0.51          +-------+-----------+-----------+------------+------------+  Left  0.73       N/A        0.71  0.27          +-------+-----------+-----------+------------+------------+      ASSESSMENT/PLAN: Andrew Larsen is a 83 y.o. male presenting with chronic osteomyelitis of the left fibula with draining sinus.  He has had previous history of open reduction internal fixation for left ankle fracture with subsequent debridement and tissue grafting.  This has struggled to heal.  On physical exam, he had nonpalpable pulses in the foot. ABI was reviewed demonstrating dampened, monophasic waveforms bilaterally. Prior angiogram was also reviewed demonstrating severe popliteal disease.  He also had interval SFA stenting.  I had a long conversation with Nancyann regarding the above.  He would benefit from left lower extremity angiogram with possible intervention in an effort to define and improve distal perfusion for wound healing.  I think this will likely require intervention to the superficial femoral artery, popliteal artery, tibial vessels.  After discussing risks and benefits, he elected to proceed.  I discussed the above with Dr. Harden.  He feels like he can  manage the chronic osteomyelitis and promote wound healing should perfusion improved.   Andrew FORBES Rim, MD [facility] [phone] Total time of patient care including pre-visit research, consultation, and documentation greater than 60 minutes

## 2023-11-13 NOTE — Op Note (Signed)
 Patient name: Andrew Larsen MRN: [id number] DOB: 1941-02-07 Sex: male  11/13/2023 Pre-operative Diagnosis: Left lower extremity critical ischemia with tissue loss at the ankle Post-operative diagnosis:  Same Surgeon:  Fonda FORBES Rim, MD Procedure Performed: 1.  Ultrasound-guided micropuncture access of the right common femoral artery in retrograde fashion 2.  Aortogram 3.  Second-order cannulation, left lower extremity angiogram 4.  Selective angiography of the popliteal artery 5.  6 x 15cm Gore Viabahn stent in the popliteal artery 6.  5 x 40 cm drug-coated balloon popliteal artery 7.  2.5 x 80 mm balloon angioplasty distal posterior tibial artery, lateral plantar artery 8. Right lower extremity angiogram 9.  Moderate sedation time 72 minutes, contrast volume 130 cc   Indications: Andrew Larsen is a 83 y.o. male presenting with chronic osteomyelitis of the left fibula with draining sinus.  He has had previous history of open reduction internal fixation for left ankle fracture with subsequent debridement and tissue grafting.  This has struggled to heal. On physical exam, he had nonpalpable pulses in the foot. ABI was reviewed demonstrating dampened, monophasic waveforms bilaterally. Prior angiogram was also reviewed demonstrating severe popliteal disease.  He also had interval SFA stenting. I had a long conversation with Andrew Larsen regarding the above.  He would benefit from left lower extremity angiogram with possible intervention in an effort to define and improve distal perfusion for wound healing.  I think this will likely require intervention to the superficial femoral artery, popliteal artery, tibial vessels.  After discussing risks and benefits, he elected to proceed. I discussed the above with Dr. Harden.  He feels like he can manage the chronic osteomyelitis and promote wound healing should perfusion improved.  Findings:  Aorta: Bilateral renal arteries patent, no flow-limiting  stenosis in the aortoiliac segments bilaterally  On the left: Widely patent common femoral artery, profunda.  The superficial femoral artery is patent, however the distal 2 cm is occluded.  The previously placed popliteal artery spanning from the P1 segment of the popliteal artery, into the P2 segment of the popliteal artery is occluded.  This was chronic.  The P2 segment of the popliteal artery reconstitutes through geniculate collaterals.  Distally, there is two-vessel runoff, posterior tibial artery, peroneal artery.    On the right: Widely patent common femoral artery, proximal superficial femoral artery, proximal profunda artery   Procedure:  The patient was identified in the holding area and taken to room 8.  The patient was then placed supine on the table and prepped and draped in the usual sterile fashion.  A time out was called.  Ultrasound was used to evaluate the right common femoral artery.  It was patent .  A digital ultrasound image was acquired.  A micropuncture needle was used to access the right common femoral artery under ultrasound guidance.  An 018 wire was advanced without resistance and a micropuncture sheath was placed.  The 018 wire was removed and a benson wire was placed.  The micropuncture sheath was exchanged for a 5 french sheath.  An omniflush catheter was advanced over the wire to the level of L-1.  An abdominal angiogram was obtained.  Next, using the omniflush catheter and a benson wire, the aortic bifurcation was crossed and the catheter was placed into theleft external iliac artery and left runoff was obtained.  Prior to Mynx device placement, I performed angiography of the right leg, specifically the common femoral artery, proximal profunda, proximal superficial femoral artery using retrograde sheath  injection.  Due to the patient's age, comorbidities, and poor ambulatory status, I elected to move forward with attempted endovascular intervention as open surgery would  require SFA to below-knee popliteal artery bypass.  The patient was heparinized and a 6 x 45 cm sheath was parked in the left common femoral artery.  I was able to then use a series of wires and catheters to traverse the distal SFA occlusion as well as the popliteal stent occlusion.  A catheter was placed in the P3 segment of the popliteal artery with selective angiography of the popliteal artery demonstrating true lumen access.  At this point, I brought up a 3 x 150 mm balloon and ballooned the entirety of the occluded segment to make space for stent placement.  I did not want to have an embolic event, and I thought I could cover the entirety of the previous stent as well as the more proximal occlusion without covering branches using a 6 x 150 Gore Viabahn.  This was brought onto the field, laid in place and deployed.  Follow-up angiography demonstrated excellent result.  This was postdilated with a 5 x 150 mm balloon.  There was still some residual stenosis distal to the stent in the P2 segment of the popliteal artery, therefore I brought a 5 x 40 mm drug-coated balloon onto the field.  This was inflated to profile and held for 2 minutes.  Follow-up angiography demonstrated an excellent result.  There was an intimal defect, however this was nonflow limiting.  I reinflated the balloon in an effort to tack.  Follow-up angiography again demonstrated the defect, however was nonflow-limiting, therefore I elected not to extend stents through the P2 segment of the popliteal artery as I wanted to leave the below-knee popliteal artery available for bypass target should be necessary.   At this point, I performed distal angiography of the runoff and noted a lesion in the distal posterior tibial artery below the level of the ankle.  I used a 2.5 x 80 mm balloon inflated from the distal posterior tibial artery into the lateral plantar artery.  Follow-up angiography demonstrated excellent result with brisk filling of the foot  from both the posterior tibial and peroneal arteries.  There was a small lesion in a distal branch of the peroneal artery which I chose not to intervene upon as it did not appear to significantly affect perfusion to the foot.   Patient was closed with a minx device without issue.  Impression: Successful recanalization of the chronically occluded left popliteal artery stent with extension proximally and distally using a 6 x 150 mm Gore Viabahn stent postdilated to 5 mm balloon.  The distal portion of the popliteal artery was also balloon angioplastied as well as the distal posterior tibial artery into the lateral plantar artery.    Fonda FORBES Rim MD [facility] [place] Office: [phone]

## 2023-11-13 NOTE — Discharge Instructions (Addendum)
 Femoral Site Care This sheet gives you information about how to care for yourself after your procedure. Your health care provider may also give you more specific instructions. If you have problems or questions, contact your health care provider. What can I expect after the procedure?  After the procedure, it is common to have: Bruising that usually fades within 1-2 weeks. Tenderness at the site. Follow these instructions at home: Wound care Follow instructions from your health care provider about how to take care of your insertion site. Make sure you: Wash your hands with soap and water before you change your bandage (dressing). If soap and water are not available, use hand sanitizer. Remove your dressing as told by your health care provider. 24 hours Do not take baths, swim, or use a hot tub for 5 days. You may shower 24 hours after the procedure or as told by your health care provider. Gently wash the site with plain soap and water. Pat the area dry with a clean towel. Do not rub the site. This may cause bleeding. Do not apply powder or lotion to the site. Keep the site clean and dry. Check your femoral site every day for signs of infection. Check for: Redness, swelling, or pain. Fluid or blood. Warmth. Pus or a bad smell. Activity For the first 2-3 days after your procedure, or as long as directed: Avoid climbing stairs as much as possible. Do not squat. Do not lift anything that is heavier than 10 lb (4.5 kg), or the limit that you are told, until your health care provider says that it is safe. For 5 days Rest as directed. Avoid sitting for a long time without moving. Get up to take short walks every 1-2 hours. Do not drive for 24 hours if you were given a medicine to help you relax (sedative). General instructions Take over-the-counter and prescription medicines only as told by your health care provider. Keep all follow-up visits as told by your health care provider. This is  important. Contact a health care provider if you have: A fever or chills. You have redness, swelling, or pain around your insertion site. Get help right away if: The catheter insertion area swells very fast. You pass out. You suddenly start to sweat or your skin gets clammy. The catheter insertion area is bleeding, and the bleeding does not stop when you hold steady pressure on the area. The area near or just beyond the catheter insertion site becomes pale, cool, tingly, or numb. These symptoms may represent a serious problem that is an emergency. Do not wait to see if the symptoms will go away. Get medical help right away. Call your local emergency services (911 in the [place] Do not drive yourself to the hospital. Summary After the procedure, it is common to have bruising that usually fades within 1-2 weeks. Check your femoral site every day for signs of infection. Do not lift anything that is heavier than 10 lb (4.5 kg), or the limit that you are told, until your health care provider says that it is safe. This information is not intended to replace advice given to you by your health care provider. Make sure you discuss any questions you have with your health care provider. Document Revised: 04/29/2017 Document Reviewed: 04/29/2017 Elsevier Patient Education  2020 [employer].

## 2023-11-14 ENCOUNTER — Encounter (HOSPITAL_COMMUNITY): Payer: Self-pay | Admitting: Vascular Surgery

## 2023-11-14 DIAGNOSIS — T8579XA Infection and inflammatory reaction due to other internal prosthetic devices, implants and grafts, initial encounter: Secondary | ICD-10-CM | POA: Diagnosis not present

## 2023-11-21 ENCOUNTER — Encounter: Admitting: Orthopedic Surgery

## 2023-11-21 ENCOUNTER — Ambulatory Visit (INDEPENDENT_AMBULATORY_CARE_PROVIDER_SITE_OTHER): Admitting: Physician Assistant

## 2023-11-21 ENCOUNTER — Encounter: Payer: Self-pay | Admitting: Physician Assistant

## 2023-11-21 DIAGNOSIS — T8131XS Disruption of external operation (surgical) wound, not elsewhere classified, sequela: Secondary | ICD-10-CM | POA: Diagnosis not present

## 2023-11-21 NOTE — Progress Notes (Addendum)
 Office Visit Note   Patient: Andrew Larsen           Date of Birth: [birth year]           MRN: [id number] Visit Date: 11/21/2023              Requested by: Larnell Hamilton, MD [address] [place],  [place] [address] PCP: Larnell Hamilton, MD  Chief Complaint  Patient presents with   Left Ankle - Wound Check      HPI: 83 y/o male with history of left ankle fracture.  He developed an abscess over the harwre.  Dr. Velinda Chancy took him to th OR 08/28/23 for irrigtion and debrideent of the left lateral ankle wound.  He removed the plate and screws, leaving the medial malleolus lag screw for stability.  Applied Kerecis graft.  He was seen by Dr. Harden on 11/05/23 and recommended exam by vascular for PAD.  He is s/p post angiogram  with popliteal stent, angioplasty of distal posterior tibial artery, lateral plantar artery.  He is here today for wound exam.    Assessment & Plan: Visit Diagnoses: No diagnosis found.  Plan: Wash with soap and water, Vashe guaze ace wrap for compression and elevation multiple times a day.    Follow-Up Instructions: Return in about 4 weeks (around 12/19/2023).   Ortho Exam  Patient is alert, oriented, no adenopathy, well-dressed, normal affect, normal respiratory effort. Moderate edema in the left foot.  Lateral malleolus wound cleaned with Vashe and 4 x 4 to produce good bleeding and beefy red wound base. The wound measures 1 x 3 cm with healthy granulation tissue.      Imaging: No results found.   Labs: Lab Results  Component Value Date   HGBA1C 7.3 (H) 07/21/2023   HGBA1C 8.4 (H) 09/02/2021   HGBA1C 7.3 (H) 11/08/2018   ESRSEDRATE 22 10/07/2023   ESRSEDRATE 19 09/24/2023   ESRSEDRATE 34 09/16/2023   CRP 4.8 (H) 08/28/2023   CRP <0.8 11/27/2018   CRP <0.8 11/25/2018   REPTSTATUS 08/30/2023 FINAL 08/27/2023   GRAMSTAIN NO WBC SEEN NO ORGANISMS SEEN  08/27/2023   CULT ENTEROBACTER CLOACAE (A) 08/27/2023   LABORGA ENTEROBACTER CLOACAE  08/27/2023     Lab Results  Component Value Date   ALBUMIN 3.3 (L) 08/29/2023   ALBUMIN 3.1 (L) 08/28/2023   ALBUMIN 3.5 09/01/2021    Lab Results  Component Value Date   MG 2.2 08/29/2023   MG 1.9 09/05/2021   MG 1.7 09/03/2021   No results found for: VD25OH  No results found for: PREALBUMIN    Latest Ref Rng & Units 11/13/2023    6:28 AM 10/07/2023   12:00 AM 09/24/2023   12:00 AM  CBC EXTENDED  WBC   6.0     6.0      RBC 3.87 - 5.11  4.27     4.12      Hemoglobin 13.0 - 17.0 g/dL 87.0  86.8     87.2      HCT 39.0 - 52.0 % 38.0  41     39      Platelets 150 - 400 K/uL  166     164      NEUT#   3,204.00     3,096.00         This result is from an external source.     There is no height or weight on file to calculate BMI.  Orders:  No orders  of the defined types were placed in this encounter.  No orders of the defined types were placed in this encounter.    Procedures: No procedures performed  Clinical Data: No additional findings.  ROS:  All other systems negative, except as noted in the HPI. Review of Systems  Objective: Vital Signs: There were no vitals taken for this visit.  Specialty Comments:  No specialty comments available.  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) 08/27/2023   ILD (interstitial lung disease) (HCC) 07/26/2023   PAD (peripheral artery disease) (HCC) 07/23/2023   OSA (obstructive sleep apnea) 07/23/2023   BPH (benign prostatic hyperplasia) 07/23/2023   Trimalleolar fracture of ankle, closed, left, initial encounter 07/20/2023   Malignant neoplasm of prostate (HCC) 11/04/2022   Complicated UTI (urinary tract infection) 09/06/2021   Cough 01/07/2019   Labile blood glucose    SOB (shortness of breath)     Urinary frequency    Hypoalbuminemia due to protein-calorie malnutrition (HCC)    Stage 3a chronic kidney disease (CKD) (HCC)    Physical debility 12/01/2018   Pressure injury of skin 11/27/2018   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   DMII (diabetes mellitus, type 2) (HCC) 11/03/2018   Essential hypertension 11/03/2018   Claudication in peripheral vascular disease (HCC) 06/10/2016   Morbid obesity (HCC) 09/21/2015   Upper airway cough syndrome 09/20/2015   Cough variant asthma 09/20/2015   Past Medical History:  Diagnosis Date   Arthritis    CHF (congestive heart failure) (HCC)    per pt's daughter   CKD (chronic kidney disease)    CKD stage III (01/2018)   Daytime somnolence    Diabetes mellitus without complication (HCC)    Fracture    right fibula/wears boot cast   GERD (gastroesophageal reflux disease)    Glaucoma    Hypercholesteremia    Hyperlipidemia    Hypertension    Inclusion body myositis    diagnosed 2020   Mold exposure    7 yrs ago   PAD (peripheral artery disease) (HCC)    Prostate cancer (HCC)    Sleep apnea    uses cpap   Uses walker    Wears glasses     Family History  Problem Relation Age of Onset   Heart disease Father    Prostate cancer Brother    Cancer Brother    Diabetes Brother    Colon cancer Neg Hx    Sleep apnea Neg Hx     Past Surgical History:  Procedure Laterality Date   ABDOMINAL AORTOGRAM N/A 11/13/2023   Procedure: ABDOMINAL AORTOGRAM;  Surgeon: Lanis Fonda BRAVO, MD;  Location: [facility] INVASIVE CV LAB;  Service: Cardiovascular;  Laterality: N/A;   ALLOGRAFT APPLICATION Left 08/27/2023   Procedure: APPLICATION, ALLOGRAFT, SKIN;  Surgeon: Beverley Evalene BIRCH, MD;  Location: [facility] OR;  Service: Orthopedics;  Laterality: Left;   APPLICATION OF WOUND VAC Left 08/27/2023   Procedure: APPLICATION, WOUND VAC;  Surgeon: Beverley Evalene BIRCH, MD;  Location: [facility] OR;  Service: Orthopedics;  Laterality: Left;   GOLD SEED IMPLANT N/A 01/08/2023   Procedure:  GOLD SEED IMPLANT;  Surgeon: Lovie Arlyss CROME, MD;  Location: [facility];  Service: Urology;  Laterality: N/A;  30 MINUTES   HARDWARE REMOVAL Left 08/27/2023   Procedure: REMOVAL, HARDWARE;  Surgeon: Beverley Evalene BIRCH, MD;  Location: [facility] OR;  Service: Orthopedics;  Laterality: Left;   INCISION AND DRAINAGE OF DEEP ABSCESS, ANKLE Left 08/27/2023   Procedure: INCISION AND DRAINAGE OF DEEP ABSCESS, ANKLE;  Surgeon: Beverley Evalene BIRCH, MD;  Location: [facility] OR;  Service: Orthopedics;  Laterality: Left;   LOWER EXTREMITY ANGIOGRAPHY N/A 06/12/2016   Procedure: Lower Extremity Angiography;  Surgeon: Gordy Bergamo, MD;  Location: [facility] INVASIVE CV LAB;  Service: Cardiovascular;  Laterality: N/A;   LOWER EXTREMITY ANGIOGRAPHY Left 11/13/2023   Procedure: Lower Extremity Angiography;  Surgeon: Lanis Fonda BRAVO, MD;  Location: [facility] INVASIVE CV LAB;  Service: Cardiovascular;  Laterality: Left;   LOWER EXTREMITY INTERVENTION Left 11/13/2023   Procedure: LOWER EXTREMITY INTERVENTION;  Surgeon: Lanis Fonda BRAVO, MD;  Location: [facility] INVASIVE CV LAB;  Service: Cardiovascular;  Laterality: Left;   MUSCLE BIOPSY Right 05/06/2018   Procedure: right vastus lateralis muscle biopsy;  Surgeon: Louis Shove, MD;  Location: [facility] OR;  Service: Neurosurgery;  Laterality: Right;   ORIF ANKLE FRACTURE Right 10/02/2012   Procedure: OPEN REDUCTION INTERNAL FIXATION (ORIF) ANKLE FRACTURE;  Surgeon: Cordella Glendia Hutchinson, MD;  Location: [facility] ORS;  Service: Orthopedics;  Laterality: Right;   ORIF ANKLE FRACTURE Left 07/21/2023   Procedure: OPEN REDUCTION INTERNAL FIXATION (ORIF) ANKLE FRACTURE;  Surgeon: Beverley Evalene BIRCH, MD;  Location: [facility] ORS;  Service: Orthopedics;  Laterality: Left;   PERIPHERAL VASCULAR ATHERECTOMY Left 06/12/2016   Procedure: Peripheral Vascular Atherectomy-Left Popliteal;  Surgeon: Gordy Bergamo, MD;  Location: [facility] INVASIVE CV LAB;  Service: Cardiovascular;  Laterality: Left;   PERIPHERAL VASCULAR INTERVENTION Left 06/12/2016    Procedure: Peripheral Vascular Intervention- DCB Left Popliteal;  Surgeon: Gordy Bergamo, MD;  Location: [facility] INVASIVE CV LAB;  Service: Cardiovascular;  Laterality: Left;   PROSTATE BIOPSY     ROTATION FLAP Left 08/27/2023   Procedure: CREATION, FLAP, ROTATION;  Surgeon: Beverley Evalene BIRCH, MD;  Location: [facility] OR;  Service: Orthopedics;  Laterality: Left;   SPACE OAR INSTILLATION N/A 01/08/2023   Procedure: SPACE OAR INSTILLATION;  Surgeon: Lovie Arlyss CROME, MD;  Location[facility];  Service: Urology;  Laterality: N/A;   Social History   Occupational History   Not on file  Tobacco Use   Smoking status: Former    Current packs/day: 0.00    Average packs/day: 1 pack/day for 20.0 years (20.0 ttl pk-yrs)    Types: Cigarettes    Start date: 08/22/1961    Quit date: 08/22/1981    Years since quitting: 42.2   Smokeless tobacco: Never  Vaping Use   Vaping status: Never Used  Substance and Sexual Activity   Alcohol  use: No    Alcohol /week: 0.0 standard drinks of alcohol    Drug use: No   Sexual activity: Never

## 2023-11-27 ENCOUNTER — Other Ambulatory Visit: Payer: Self-pay

## 2023-11-27 DIAGNOSIS — I739 Peripheral vascular disease, unspecified: Secondary | ICD-10-CM

## 2023-12-02 DIAGNOSIS — T8459XD Infection and inflammatory reaction due to other internal joint prosthesis, subsequent encounter: Secondary | ICD-10-CM | POA: Diagnosis not present

## 2023-12-02 DIAGNOSIS — I509 Heart failure, unspecified: Secondary | ICD-10-CM | POA: Diagnosis not present

## 2023-12-02 DIAGNOSIS — I130 Hypertensive heart and chronic kidney disease with heart failure and stage 1 through stage 4 chronic kidney disease, or unspecified chronic kidney disease: Secondary | ICD-10-CM | POA: Diagnosis not present

## 2023-12-02 DIAGNOSIS — E1122 Type 2 diabetes mellitus with diabetic chronic kidney disease: Secondary | ICD-10-CM | POA: Diagnosis not present

## 2023-12-02 DIAGNOSIS — N1831 Chronic kidney disease, stage 3a: Secondary | ICD-10-CM | POA: Diagnosis not present

## 2023-12-02 DIAGNOSIS — G4730 Sleep apnea, unspecified: Secondary | ICD-10-CM | POA: Diagnosis not present

## 2023-12-02 DIAGNOSIS — H409 Unspecified glaucoma: Secondary | ICD-10-CM | POA: Diagnosis not present

## 2023-12-02 DIAGNOSIS — N400 Enlarged prostate without lower urinary tract symptoms: Secondary | ICD-10-CM | POA: Diagnosis not present

## 2023-12-02 DIAGNOSIS — M1990 Unspecified osteoarthritis, unspecified site: Secondary | ICD-10-CM | POA: Diagnosis not present

## 2023-12-02 DIAGNOSIS — G4700 Insomnia, unspecified: Secondary | ICD-10-CM | POA: Diagnosis not present

## 2023-12-02 DIAGNOSIS — Z794 Long term (current) use of insulin: Secondary | ICD-10-CM | POA: Diagnosis not present

## 2023-12-02 DIAGNOSIS — K219 Gastro-esophageal reflux disease without esophagitis: Secondary | ICD-10-CM | POA: Diagnosis not present

## 2023-12-11 ENCOUNTER — Other Ambulatory Visit: Payer: Self-pay | Admitting: Student

## 2023-12-18 NOTE — Progress Notes (Unsigned)
 Office Note     HPI: Andrew Larsen is a 83 y.o. (April 13, 1941) male presenting with history of left fibula osteomyelitis.    Prior inventions include left-sided popliteal artery stenting by Dr. Ladona in 2018.  He is now status post 11/13/2023 left popliteal artery restenting due to occlusion with Gore Viabahn, PTA angioplasty into the lateral plantar artery for critical limb ischemia with tissue loss.    On exam, Andrew Larsen was doing well, accompanied by his daughter.  A native of [facility] [place]  veins were years ago.  He is retired from [employer], and collects pension.  Unfortunately, Andrew Larsen suffered a fracture of the left ankle months ago, which was treated with ORIF.  He had poor perfusion, therefore poor wound healing.  He has been taken back by Dr. Harden for limb salvage.  Dr. Harden recognize that wound healing was poor due to peripheral arterial disease, therefore he presents to our office for evaluation.  Patient is ambulatory using a walker, trying to keep the weight off his left foot at this time.     Past Medical History:  Diagnosis Date   Arthritis    CHF (congestive heart failure) (HCC)    per pt's daughter   CKD (chronic kidney disease)    CKD stage III (01/2018)   Daytime somnolence    Diabetes mellitus without complication (HCC)    Fracture    right fibula/wears boot cast   GERD (gastroesophageal reflux disease)    Glaucoma    Hypercholesteremia    Hyperlipidemia    Hypertension    Inclusion body myositis    diagnosed 2020   Mold exposure    7 yrs ago   PAD (peripheral artery disease) (HCC)    Prostate cancer (HCC)    Sleep apnea    uses cpap   Uses walker    Wears glasses     Past Surgical History:  Procedure Laterality Date   ABDOMINAL AORTOGRAM N/A 11/13/2023   Procedure: ABDOMINAL AORTOGRAM;  Surgeon: Lanis Fonda BRAVO, MD;  Location: [facility] INVASIVE CV LAB;  Service: Cardiovascular;  Laterality: N/A;   ALLOGRAFT APPLICATION Left 08/27/2023   Procedure:  APPLICATION, ALLOGRAFT, SKIN;  Surgeon: Beverley Evalene BIRCH, MD;  Location: [facility] OR;  Service: Orthopedics;  Laterality: Left;   APPLICATION OF WOUND VAC Left 08/27/2023   Procedure: APPLICATION, WOUND VAC;  Surgeon: Beverley Evalene BIRCH, MD;  Location: [facility] OR;  Service: Orthopedics;  Laterality: Left;   GOLD SEED IMPLANT N/A 01/08/2023   Procedure: GOLD SEED IMPLANT;  Surgeon: Lovie Molly LITTIE, MD;  Location: [facility];  Service: Urology;  Laterality: N/A;  30 MINUTES   HARDWARE REMOVAL Left 08/27/2023   Procedure: REMOVAL, HARDWARE;  Surgeon: Beverley Evalene BIRCH, MD;  Location: [facility] OR;  Service: Orthopedics;  Laterality: Left;   INCISION AND DRAINAGE OF DEEP ABSCESS, ANKLE Left 08/27/2023   Procedure: INCISION AND DRAINAGE OF DEEP ABSCESS, ANKLE;  Surgeon: Beverley Evalene BIRCH, MD;  Location: [facility] OR;  Service: Orthopedics;  Laterality: Left;   LOWER EXTREMITY ANGIOGRAPHY N/A 06/12/2016   Procedure: Lower Extremity Angiography;  Surgeon: Gordy Ladona, MD;  Location: [facility] INVASIVE CV LAB;  Service: Cardiovascular;  Laterality: N/A;   LOWER EXTREMITY ANGIOGRAPHY Left 11/13/2023   Procedure: Lower Extremity Angiography;  Surgeon: Lanis Fonda BRAVO, MD;  Location: [facility] INVASIVE CV LAB;  Service: Cardiovascular;  Laterality: Left;   LOWER EXTREMITY INTERVENTION Left 11/13/2023   Procedure: LOWER EXTREMITY INTERVENTION;  Surgeon: Lanis Fonda BRAVO, MD;  Location: [facility]  INVASIVE CV LAB;  Service: Cardiovascular;  Laterality: Left;   MUSCLE BIOPSY Right 05/06/2018   Procedure: right vastus lateralis muscle biopsy;  Surgeon: Louis Shove, MD;  Location: [facility] OR;  Service: Neurosurgery;  Laterality: Right;   ORIF ANKLE FRACTURE Right 10/02/2012   Procedure: OPEN REDUCTION INTERNAL FIXATION (ORIF) ANKLE FRACTURE;  Surgeon: Cordella Glendia Hutchinson, MD;  Location: [facility] ORS;  Service: Orthopedics;  Laterality: Right;   ORIF ANKLE FRACTURE Left 07/21/2023   Procedure: OPEN REDUCTION INTERNAL FIXATION (ORIF) ANKLE FRACTURE;  Surgeon:  Beverley Evalene BIRCH, MD;  Location: [facility] ORS;  Service: Orthopedics;  Laterality: Left;   PERIPHERAL VASCULAR ATHERECTOMY Left 06/12/2016   Procedure: Peripheral Vascular Atherectomy-Left Popliteal;  Surgeon: Gordy Bergamo, MD;  Location: [facility] INVASIVE CV LAB;  Service: Cardiovascular;  Laterality: Left;   PERIPHERAL VASCULAR INTERVENTION Left 06/12/2016   Procedure: Peripheral Vascular Intervention- DCB Left Popliteal;  Surgeon: Gordy Bergamo, MD;  Location: [facility] INVASIVE CV LAB;  Service: Cardiovascular;  Laterality: Left;   PROSTATE BIOPSY     ROTATION FLAP Left 08/27/2023   Procedure: CREATION, FLAP, ROTATION;  Surgeon: Beverley Evalene BIRCH, MD;  Location: [facility] OR;  Service: Orthopedics;  Laterality: Left;   SPACE OAR INSTILLATION N/A 01/08/2023   Procedure: SPACE OAR INSTILLATION;  Surgeon: Lovie Arlyss CROME, MD;  Location: [facility];  Service: Urology;  Laterality: N/A;    Social History   Socioeconomic History   Marital status: Widowed    Spouse name: Not on file   Number of children: 1   Years of education: Not on file   Highest education level: Not on file  Occupational History   Not on file  Tobacco Use   Smoking status: Former    Current packs/day: 0.00    Average packs/day: 1 pack/day for 20.0 years (20.0 ttl pk-yrs)    Types: Cigarettes    Start date: 08/22/1961    Quit date: 08/22/1981    Years since quitting: 42.3   Smokeless tobacco: Never  Vaping Use   Vaping status: Never Used  Substance and Sexual Activity   Alcohol  use: No    Alcohol /week: 0.0 standard drinks of alcohol    Drug use: No   Sexual activity: Never  Other Topics Concern   Not on file  Social History Narrative   ** Merged History Encounter **       Social Drivers of Health   Financial Resource Strain: Not on file  Food Insecurity: No Food Insecurity (08/27/2023)   Hunger Vital Sign    Worried About Running Out of Food in the Last Year: Never true    Ran Out of Food in the Last Year: Never true   Transportation Needs: No Transportation Needs (08/27/2023)   PRAPARE - Administrator, Civil Service (Medical): No    Lack of Transportation (Non-Medical): No  Physical Activity: Not on file  Stress: Not on file  Social Connections: Moderately Integrated (08/27/2023)   Social Connection and Isolation Panel    Frequency of Communication with Friends and Family: More than three times a week    Frequency of Social Gatherings with Friends and Family: More than three times a week    Attends Religious Services: More than 4 times per year    Active Member of [employer] or Organizations: Yes    Attends Banker Meetings: More than 4 times per year    Marital Status: Widowed  Intimate Partner Violence: Not At Risk (08/27/2023)   Humiliation, Afraid, Rape, and  Kick questionnaire    Fear of Current or Ex-Partner: No    Emotionally Abused: No    Physically Abused: No    Sexually Abused: No   Family History  Problem Relation Age of Onset   Heart disease Father    Prostate cancer Brother    Cancer Brother    Diabetes Brother    Colon cancer Neg Hx    Sleep apnea Neg Hx     Current Outpatient Medications  Medication Sig Dispense Refill   aspirin  EC 81 MG tablet Take 1 tablet (81 mg total) by mouth daily. Swallow whole.     clopidogrel  (PLAVIX ) 75 MG tablet Take 1 tablet (75 mg total) by mouth daily. 90 tablet 3   Continuous Blood Gluc Sensor (FREESTYLE LIBRE 2 SENSOR) MISC Inject 1 Device into the skin every 14 (fourteen) days.     Continuous Glucose Receiver (FREESTYLE LIBRE 2 READER) DEVI USE UNDER THE SKIN ONCE DAILY AS DIRECTED; Duration: 90     docusate sodium  (COLACE) 100 MG capsule Take 1 capsule (100 mg total) by mouth 2 (two) times daily as needed for mild constipation.     dorzolamide  (TRUSOPT ) 2 % ophthalmic solution Place 1 drop into the right eye 2 (two) times daily.     ezetimibe  (ZETIA ) 10 MG tablet TAKE 1 TABLET(10 MG) BY MOUTH DAILY AFTER SUPPER 90 tablet 1    furosemide  (LASIX ) 40 MG tablet Take 1 tablet (40 mg total) by mouth daily. 30 tablet 0   gabapentin  (NEURONTIN ) 300 MG capsule Take 2 capsules (600 mg total) by mouth 3 (three) times daily. 90 capsule 0   latanoprost  (XALATAN ) 0.005 % ophthalmic solution Place 1 drop into both eyes at bedtime.     losartan  (COZAAR ) 25 MG tablet TAKE 1 TABLET(25 MG) BY MOUTH DAILY 30 tablet 2   metoprolol  tartrate (LOPRESSOR ) 25 MG tablet Take 1 tablet (25 mg total) by mouth 2 (two) times daily. 60 tablet 0   Multiple Vitamin (MULTIVITAMIN WITH MINERALS) TABS tablet Take 1 tablet by mouth daily with breakfast.     NOVOLIN 70/30 (70-30) 100 UNIT/ML injection Inject 50 Units into the skin in the morning and at bedtime.     oxyCODONE  (OXY IR/ROXICODONE ) 5 MG immediate release tablet Take 1 tablet (5 mg total) by mouth every 6 (six) hours as needed for severe pain (pain score 7-10). 30 tablet 0   polyethylene glycol (MIRALAX  / GLYCOLAX ) 17 g packet Take 17 g by mouth daily as needed.     PRESCRIPTION MEDICATION CPAP- At bedtime     rosuvastatin  (CRESTOR ) 20 MG tablet Take 1 tablet (20 mg total) by mouth daily. 30 tablet 11   senna (SENOKOT) 8.6 MG TABS tablet Take 2 tablets by mouth at bedtime.     tamsulosin  (FLOMAX ) 0.4 MG CAPS capsule Take 0.4 mg by mouth daily.     timolol  (TIMOPTIC ) 0.5 % ophthalmic solution Place 1 drop into the right eye 2 (two) times daily.     zolpidem  (AMBIEN ) 5 MG tablet Take 5 mg by mouth at bedtime as needed for sleep.     No current facility-administered medications for this visit.    Allergies  Allergen Reactions   Metformin And Related Shortness Of Breath   Pollen Extract Itching and Other (See Comments)    Some wheezing, itchy eyes, runny nose   Simbrinza [Brinzolamide-Brimonidine ] Other (See Comments)    Drainage, redness to eyes     REVIEW OF SYSTEMS:  [X]  denotes positive  finding, [ ]  denotes negative finding Cardiac  Comments:  Chest pain or chest pressure:     Shortness of breath upon exertion:    Short of breath when lying flat:    Irregular heart rhythm:        Vascular    Pain in calf, thigh, or hip brought on by ambulation:    Pain in feet at night that wakes you up from your sleep:     Blood clot in your veins:    Leg swelling:         Pulmonary    Oxygen  at home:    Productive cough:     Wheezing:         Neurologic    Sudden weakness in arms or legs:     Sudden numbness in arms or legs:     Sudden onset of difficulty speaking or slurred speech:    Temporary loss of vision in one eye:     Problems with dizziness:         Gastrointestinal    Blood in stool:     Vomited blood:         Genitourinary    Burning when urinating:     Blood in urine:        Psychiatric    Major depression:         Hematologic    Bleeding problems:    Problems with blood clotting too easily:        Skin    Rashes or ulcers:        Constitutional    Fever or chills:      PHYSICAL EXAMINATION:  There were no vitals filed for this visit.  General:  WDWN in NAD; vital signs documented above Gait: Not observed HENT: WNL, normocephalic Pulmonary: normal non-labored breathing , without wheezing Cardiac: regular HR Abdomen: soft, NT, no masses Skin: without rashes Vascular Exam/Pulses:  Right Left  Radial 2+ (normal) 2+ (normal)  Ulnar    Femoral 2+ (normal) 2+ (normal)  Popliteal    DP absent absent  PT     Extremities: without ischemic changes, without Gangrene , without cellulitis; with open wounds;  Musculoskeletal: no muscle wasting or atrophy  Neurologic: A&O X 3;  No focal weakness or paresthesias are detected Psychiatric:  The pt has Normal affect.   Non-Invasive Vascular Imaging:    ABI Findings:  +---------+------------------+-----+-------------------+--------+  Right   Rt Pressure (mmHg)IndexWaveform           Comment   +---------+------------------+-----+-------------------+--------+  Brachial 156                                                  +---------+------------------+-----+-------------------+--------+  PTA     112               0.72 dampened monophasic          +---------+------------------+-----+-------------------+--------+  DP      101               0.65 monophasic                   +---------+------------------+-----+-------------------+--------+  Great Toe                       Abnormal                     +---------+------------------+-----+-------------------+--------+   +---------+------------------+-----+-------------------+-------+  Left    Lt Pressure (mmHg)IndexWaveform           Comment  +---------+------------------+-----+-------------------+-------+  Brachial 147                                                +---------+------------------+-----+-------------------+-------+  PTA     114               0.73 dampened monophasic         +---------+------------------+-----+-------------------+-------+  DP      0                 0.00 absent                      +---------+------------------+-----+-------------------+-------+  Great Toe                       Absent                      +---------+------------------+-----+-------------------+-------+   +-------+-----------+-----------+------------+------------+  ABI/TBIToday's ABIToday's TBIPrevious ABIPrevious TBI  +-------+-----------+-----------+------------+------------+  Right 0.72       N/A        0.86        0.51          +-------+-----------+-----------+------------+------------+  Left  0.73       N/A        0.71        0.27          +-------+-----------+-----------+------------+------------+      ASSESSMENT/PLAN: Andrew Larsen is a 83 y.o. male presenting with chronic osteomyelitis of the left fibula with draining sinus.  He has had previous history of open reduction internal fixation for left ankle fracture with subsequent debridement and  tissue grafting.  This has struggled to heal.  On physical exam, he had nonpalpable pulses in the foot. ABI was reviewed demonstrating dampened, monophasic waveforms bilaterally. Prior angiogram was also reviewed demonstrating severe popliteal disease.  He also had interval SFA stenting.  I had a long conversation with Nancyann regarding the above.  He would benefit from left lower extremity angiogram with possible intervention in an effort to define and improve distal perfusion for wound healing.  I think this will likely require intervention to the superficial femoral artery, popliteal artery, tibial vessels.  After discussing risks and benefits, he elected to proceed.  I discussed the above with Dr. Harden.  He feels like he can manage the chronic osteomyelitis and promote wound healing should perfusion improved.   Fonda FORBES Rim, MD [facility] [phone] Total time of patient care including pre-visit research, consultation, and documentation greater than 60 minutes

## 2023-12-19 ENCOUNTER — Encounter: Payer: Self-pay | Admitting: Vascular Surgery

## 2023-12-19 ENCOUNTER — Ambulatory Visit: Attending: Vascular Surgery | Admitting: Vascular Surgery

## 2023-12-19 ENCOUNTER — Ambulatory Visit (HOSPITAL_COMMUNITY)
Admission: RE | Admit: 2023-12-19 | Discharge: 2023-12-19 | Disposition: A | Discharge status: Home / Self Care | Source: Ambulatory Visit | Attending: Vascular Surgery | Admitting: Vascular Surgery

## 2023-12-19 ENCOUNTER — Ambulatory Visit (HOSPITAL_BASED_OUTPATIENT_CLINIC_OR_DEPARTMENT_OTHER)
Admit: 2023-12-19 | Discharge: 2023-12-19 | Disposition: A | Discharge status: Home / Self Care | Attending: Vascular Surgery | Admitting: Vascular Surgery

## 2023-12-19 VITALS — BP 137/74 | HR 58 | Temp 97.6°F | Resp 20 | Ht 71.0 in | Wt 250.0 lb

## 2023-12-19 DIAGNOSIS — I739 Peripheral vascular disease, unspecified: Secondary | ICD-10-CM | POA: Insufficient documentation

## 2023-12-19 DIAGNOSIS — I70245 Atherosclerosis of native arteries of left leg with ulceration of other part of foot: Secondary | ICD-10-CM

## 2023-12-19 DIAGNOSIS — Z959 Presence of cardiac and vascular implant and graft, unspecified: Secondary | ICD-10-CM

## 2023-12-19 DIAGNOSIS — T8189XD Other complications of procedures, not elsewhere classified, subsequent encounter: Secondary | ICD-10-CM

## 2023-12-19 LAB — VAS US ABI WITH/WO TBI: Right ABI: 0.96

## 2023-12-20 ENCOUNTER — Other Ambulatory Visit: Payer: Self-pay | Admitting: Vascular Surgery

## 2023-12-20 DIAGNOSIS — I739 Peripheral vascular disease, unspecified: Secondary | ICD-10-CM

## 2023-12-20 DIAGNOSIS — I70245 Atherosclerosis of native arteries of left leg with ulceration of other part of foot: Secondary | ICD-10-CM

## 2023-12-23 ENCOUNTER — Encounter: Payer: Self-pay | Admitting: Orthopedic Surgery

## 2023-12-23 ENCOUNTER — Ambulatory Visit (INDEPENDENT_AMBULATORY_CARE_PROVIDER_SITE_OTHER): Admitting: Orthopedic Surgery

## 2023-12-23 DIAGNOSIS — I739 Peripheral vascular disease, unspecified: Secondary | ICD-10-CM | POA: Diagnosis not present

## 2023-12-23 DIAGNOSIS — T8131XS Disruption of external operation (surgical) wound, not elsewhere classified, sequela: Secondary | ICD-10-CM | POA: Diagnosis not present

## 2023-12-23 DIAGNOSIS — M86462 Chronic osteomyelitis with draining sinus, left tibia and fibula: Secondary | ICD-10-CM | POA: Diagnosis not present

## 2023-12-23 NOTE — Progress Notes (Signed)
 Office Visit Note   Patient: Andrew Larsen           Date of Birth: 1940-08-22           MRN: [id number] Visit Date: 12/23/2023              Requested by: Larnell Hamilton, MD [address] [place],  [place] [address] PCP: Larnell Hamilton, MD  Chief Complaint  Patient presents with   Left Ankle - Follow-up      HPI: Patient is an 83 year old gentleman who is seen in follow-up for ulceration left ankle.  Patient is status post hardware removal and status post vascular surgery intervention with stenting for his peripheral vascular disease.  Assessment & Plan: Visit Diagnoses:  1. Dehiscence of operative wound, sequela     Plan: Patient will continue with the Vashe dressing changes.  Recommended patient obtain a size extra-large compression sock to be worn around-the-clock.  Follow-Up Instructions: Return in about 4 weeks (around 01/20/2024).   Ortho Exam  Patient is alert, oriented, no adenopathy, well-dressed, normal affect, normal respiratory effort. Examination the wound has flat healthy granulation tissue and currently measures 1 x 3 cm.  There is no undermining or tunneling.  Patient's calf is 43 cm in circumference.    Imaging: No results found. No images are attached to the encounter.  Labs: Lab Results  Component Value Date   HGBA1C 7.3 (H) 07/21/2023   HGBA1C 8.4 (H) 09/02/2021   HGBA1C 7.3 (H) 11/08/2018   ESRSEDRATE 22 10/07/2023   ESRSEDRATE 19 09/24/2023   ESRSEDRATE 34 09/16/2023   CRP 4.8 (H) 08/28/2023   CRP <0.8 11/27/2018   CRP <0.8 11/25/2018   REPTSTATUS 08/30/2023 FINAL 08/27/2023   GRAMSTAIN NO WBC SEEN NO ORGANISMS SEEN  08/27/2023   CULT ENTEROBACTER CLOACAE (A) 08/27/2023   LABORGA ENTEROBACTER CLOACAE 08/27/2023     Lab Results  Component Value Date   ALBUMIN 3.3 (L) 08/29/2023   ALBUMIN 3.1 (L) 08/28/2023   ALBUMIN 3.5 09/01/2021    Lab Results  Component Value Date   MG 2.2 08/29/2023   MG 1.9 09/05/2021   MG 1.7  09/03/2021   No results found for: VD25OH  No results found for: PREALBUMIN    Latest Ref Rng & Units 11/13/2023    6:28 AM 10/07/2023   12:00 AM 09/24/2023   12:00 AM  CBC EXTENDED  WBC   6.0     6.0      RBC 3.87 - 5.11  4.27     4.12      Hemoglobin 13.0 - 17.0 g/dL 87.0  86.8     87.2      HCT 39.0 - 52.0 % 38.0  41     39      Platelets 150 - 400 K/uL  166     164      NEUT#   3,204.00     3,096.00         This result is from an external source.     There is no height or weight on file to calculate BMI.  Orders:  No orders of the defined types were placed in this encounter.  No orders of the defined types were placed in this encounter.    Procedures: No procedures performed  Clinical Data: No additional findings.  ROS:  All other systems negative, except as noted in the HPI. Review of Systems  Objective: Vital Signs: There were no vitals taken for this visit.  Specialty Comments:  No specialty comments available.  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) 08/27/2023   ILD (interstitial lung disease) (HCC) 07/26/2023   PAD (peripheral artery disease) (HCC) 07/23/2023   OSA (obstructive sleep apnea) 07/23/2023   BPH (benign prostatic hyperplasia) 07/23/2023   Trimalleolar fracture of ankle, closed, left, initial encounter 07/20/2023   Malignant neoplasm of prostate (HCC) 11/04/2022   Complicated UTI (urinary tract infection) 09/06/2021   Cough 01/07/2019   Labile blood glucose    SOB (shortness of breath)    Urinary frequency    Hypoalbuminemia due to protein-calorie malnutrition (HCC)    Stage 3a chronic kidney disease (CKD) (HCC)    Physical debility 12/01/2018   Pressure injury of skin 11/27/2018   Inclusion body myositis  11/07/2018   DMII (diabetes mellitus, type 2) (HCC) 11/03/2018   Essential hypertension 11/03/2018   Claudication in peripheral vascular disease (HCC) 06/10/2016   Morbid obesity (HCC) 09/21/2015   Upper airway cough syndrome 09/20/2015   Cough variant asthma 09/20/2015   Past Medical History:  Diagnosis Date   Arthritis    CHF (congestive heart failure) (HCC)    per pt's daughter   CKD (chronic kidney disease)    CKD stage III (01/2018)   Daytime somnolence    Diabetes mellitus without complication (HCC)    Fracture    right fibula/wears boot cast   GERD (gastroesophageal reflux disease)    Glaucoma    Hypercholesteremia    Hyperlipidemia    Hypertension    Inclusion body myositis    diagnosed 2020   Mold exposure    7 yrs ago   PAD (peripheral artery disease) (HCC)    Prostate cancer (HCC)    Sleep apnea    uses cpap   Uses walker    Wears glasses     Family History  Problem Relation Age of Onset   Heart disease Father    Prostate cancer Brother    Cancer Brother    Diabetes Brother    Colon cancer Neg Hx    Sleep apnea Neg Hx     Past Surgical History:  Procedure Laterality Date   ABDOMINAL AORTOGRAM N/A 11/13/2023   Procedure: ABDOMINAL AORTOGRAM;  Surgeon: Lanis Fonda BRAVO, MD;  Location: [facility] INVASIVE CV LAB;  Service: Cardiovascular;  Laterality: N/A;   ALLOGRAFT APPLICATION Left 08/27/2023   Procedure: APPLICATION, ALLOGRAFT, SKIN;  Surgeon: Beverley Evalene BIRCH, MD;  Location: [facility] OR;  Service: Orthopedics;  Laterality: Left;   APPLICATION OF WOUND VAC Left 08/27/2023   Procedure: APPLICATION, WOUND VAC;  Surgeon: Beverley Evalene BIRCH, MD;  Location: [facility] OR;  Service: Orthopedics;  Laterality: Left;   GOLD SEED IMPLANT N/A 01/08/2023   Procedure: GOLD SEED IMPLANT;  Surgeon: Lovie Arlyss CROME, MD;  Location: [facility];  Service: Urology;  Laterality: N/A;  30 MINUTES   HARDWARE REMOVAL Left 08/27/2023   Procedure: REMOVAL, HARDWARE;  Surgeon: Beverley Evalene BIRCH, MD;  Location: [facility] OR;  Service: Orthopedics;  Laterality: Left;   INCISION AND DRAINAGE OF DEEP ABSCESS, ANKLE Left 08/27/2023   Procedure: INCISION AND DRAINAGE OF DEEP ABSCESS, ANKLE;  Surgeon: Beverley Evalene BIRCH, MD;  Location: [facility] OR;  Service: Orthopedics;  Laterality: Left;   LOWER EXTREMITY ANGIOGRAPHY N/A 06/12/2016   Procedure:  Lower Extremity Angiography;  Surgeon: Gordy Bergamo, MD;  Location: [facility] INVASIVE CV LAB;  Service: Cardiovascular;  Laterality: N/A;   LOWER EXTREMITY ANGIOGRAPHY Left 11/13/2023   Procedure: Lower Extremity Angiography;  Surgeon: Lanis Fonda BRAVO, MD;  Location: [facility] INVASIVE CV LAB;  Service: Cardiovascular;  Laterality: Left;   LOWER EXTREMITY INTERVENTION Left 11/13/2023   Procedure: LOWER EXTREMITY INTERVENTION;  Surgeon: Lanis Fonda BRAVO, MD;  Location: [facility] INVASIVE CV LAB;  Service: Cardiovascular;  Laterality: Left;   MUSCLE BIOPSY Right 05/06/2018   Procedure: right vastus lateralis muscle biopsy;  Surgeon: Louis Shove, MD;  Location: [facility] OR;  Service: Neurosurgery;  Laterality: Right;   ORIF ANKLE FRACTURE Right 10/02/2012   Procedure: OPEN REDUCTION INTERNAL FIXATION (ORIF) ANKLE FRACTURE;  Surgeon: Cordella Glendia Hutchinson, MD;  Location: [facility] ORS;  Service: Orthopedics;  Laterality: Right;   ORIF ANKLE FRACTURE Left 07/21/2023   Procedure: OPEN REDUCTION INTERNAL FIXATION (ORIF) ANKLE FRACTURE;  Surgeon: Beverley Evalene BIRCH, MD;  Location: [facility] ORS;  Service: Orthopedics;  Laterality: Left;   PERIPHERAL VASCULAR ATHERECTOMY Left 06/12/2016   Procedure: Peripheral Vascular Atherectomy-Left Popliteal;  Surgeon: Gordy Bergamo, MD;  Location: [facility] INVASIVE CV LAB;  Service: Cardiovascular;  Laterality: Left;   PERIPHERAL VASCULAR INTERVENTION Left 06/12/2016   Procedure: Peripheral Vascular Intervention- DCB Left Popliteal;  Surgeon: Gordy Bergamo, MD;  Location: [facility] INVASIVE CV LAB;  Service: Cardiovascular;  Laterality: Left;   PROSTATE BIOPSY     ROTATION FLAP Left 08/27/2023    Procedure: CREATION, FLAP, ROTATION;  Surgeon: Beverley Evalene BIRCH, MD;  Location: [facility] OR;  Service: Orthopedics;  Laterality: Left;   SPACE OAR INSTILLATION N/A 01/08/2023   Procedure: SPACE OAR INSTILLATION;  Surgeon: Lovie Arlyss CROME, MD;  Location: [facility];  Service: Urology;  Laterality: N/A;   Social History   Occupational History   Not on file  Tobacco Use   Smoking status: Former    Current packs/day: 0.00    Average packs/day: 1 pack/day for 20.0 years (20.0 ttl pk-yrs)    Types: Cigarettes    Start date: 08/22/1961    Quit date: 08/22/1981    Years since quitting: 42.3   Smokeless tobacco: Never  Vaping Use   Vaping status: Never Used  Substance and Sexual Activity   Alcohol  use: No    Alcohol /week: 0.0 standard drinks of alcohol    Drug use: No   Sexual activity: Never

## 2024-01-07 DIAGNOSIS — I130 Hypertensive heart and chronic kidney disease with heart failure and stage 1 through stage 4 chronic kidney disease, or unspecified chronic kidney disease: Secondary | ICD-10-CM | POA: Diagnosis not present

## 2024-01-07 DIAGNOSIS — G4700 Insomnia, unspecified: Secondary | ICD-10-CM | POA: Diagnosis not present

## 2024-01-07 DIAGNOSIS — N400 Enlarged prostate without lower urinary tract symptoms: Secondary | ICD-10-CM | POA: Diagnosis not present

## 2024-01-07 DIAGNOSIS — G4730 Sleep apnea, unspecified: Secondary | ICD-10-CM | POA: Diagnosis not present

## 2024-01-07 DIAGNOSIS — Z794 Long term (current) use of insulin: Secondary | ICD-10-CM | POA: Diagnosis not present

## 2024-01-07 DIAGNOSIS — M1990 Unspecified osteoarthritis, unspecified site: Secondary | ICD-10-CM | POA: Diagnosis not present

## 2024-01-07 DIAGNOSIS — E1151 Type 2 diabetes mellitus with diabetic peripheral angiopathy without gangrene: Secondary | ICD-10-CM | POA: Diagnosis not present

## 2024-01-07 DIAGNOSIS — N1831 Chronic kidney disease, stage 3a: Secondary | ICD-10-CM | POA: Diagnosis not present

## 2024-01-07 DIAGNOSIS — H409 Unspecified glaucoma: Secondary | ICD-10-CM | POA: Diagnosis not present

## 2024-01-07 DIAGNOSIS — T8459XD Infection and inflammatory reaction due to other internal joint prosthesis, subsequent encounter: Secondary | ICD-10-CM | POA: Diagnosis not present

## 2024-01-07 DIAGNOSIS — E1122 Type 2 diabetes mellitus with diabetic chronic kidney disease: Secondary | ICD-10-CM | POA: Diagnosis not present

## 2024-01-07 DIAGNOSIS — I509 Heart failure, unspecified: Secondary | ICD-10-CM | POA: Diagnosis not present

## 2024-01-16 DIAGNOSIS — G4733 Obstructive sleep apnea (adult) (pediatric): Secondary | ICD-10-CM | POA: Diagnosis not present

## 2024-01-20 ENCOUNTER — Encounter: Payer: Self-pay | Admitting: Orthopedic Surgery

## 2024-01-20 ENCOUNTER — Ambulatory Visit (INDEPENDENT_AMBULATORY_CARE_PROVIDER_SITE_OTHER): Admitting: Orthopedic Surgery

## 2024-01-20 DIAGNOSIS — T8131XS Disruption of external operation (surgical) wound, not elsewhere classified, sequela: Secondary | ICD-10-CM | POA: Diagnosis not present

## 2024-01-20 DIAGNOSIS — I739 Peripheral vascular disease, unspecified: Secondary | ICD-10-CM

## 2024-01-20 NOTE — Progress Notes (Signed)
 Office Visit Note   Patient: Andrew Larsen           Date of Birth: 1941-03-10           MRN: [id number] Visit Date: 01/20/2024              Requested by: Larnell Hamilton, MD [address] [place],  [place] [address] PCP: Larnell Hamilton, MD  Chief Complaint  Patient presents with   Left Ankle - Follow-up      HPI: Discussed the use of AI scribe software for clinical note transcription with the patient, who gave verbal consent to proceed.  History of Present Illness Andrew Larsen is an 83 year old male who presents for follow-up of a lateral ankle wound.  He notes a decrease in ankle swelling and has been wearing knee-high compression socks daily, except for today. He currently only has one pair and needs more.  He is using a medicated sock prescribed and obtained from a medical supply store, which can be placed directly against the wound. He is unsure if the current compression socks are too loose now that the swelling has reduced.     Assessment & Plan: Visit Diagnoses:  1. Dehiscence of operative wound, sequela   2. PAD (peripheral artery disease)     Plan: Assessment and Plan Assessment & Plan Lateral ankle wound Wound nearly healed, 0.5x1 cm, flat granulation tissue. Compression socks effective, size appropriate for 41 cm calf. - Apply 4x4 gauze and ACE wrap today. - Continue washing wound with soap and water, keep dry. - Wear extra large Vive compression sock directly against wound. - Re-evaluate in four weeks.      Follow-Up Instructions: Return in about 4 weeks (around 02/17/2024).   Ortho Exam  Patient is alert, oriented, no adenopathy, well-dressed, normal affect, normal respiratory effort. Physical Exam EXTREMITIES: Lateral ankle wound almost completely healed, measuring 0.5 by 1 cm with flat granulation tissue.      Imaging: No results found. No images are attached to the encounter.  Labs: Lab Results  Component Value Date   HGBA1C  7.3 (H) 07/21/2023   HGBA1C 8.4 (H) 09/02/2021   HGBA1C 7.3 (H) 11/08/2018   ESRSEDRATE 22 10/07/2023   ESRSEDRATE 19 09/24/2023   ESRSEDRATE 34 09/16/2023   CRP 4.8 (H) 08/28/2023   CRP <0.8 11/27/2018   CRP <0.8 11/25/2018   REPTSTATUS 08/30/2023 FINAL 08/27/2023   GRAMSTAIN NO WBC SEEN NO ORGANISMS SEEN  08/27/2023   CULT ENTEROBACTER CLOACAE (A) 08/27/2023   LABORGA ENTEROBACTER CLOACAE 08/27/2023     Lab Results  Component Value Date   ALBUMIN 3.3 (L) 08/29/2023   ALBUMIN 3.1 (L) 08/28/2023   ALBUMIN 3.5 09/01/2021    Lab Results  Component Value Date   MG 2.2 08/29/2023   MG 1.9 09/05/2021   MG 1.7 09/03/2021   No results found for: VD25OH  No results found for: PREALBUMIN    Latest Ref Rng & Units 11/13/2023    6:28 AM 10/07/2023   12:00 AM 09/24/2023   12:00 AM  CBC EXTENDED  WBC   6.0     6.0      RBC 3.87 - 5.11  4.27     4.12      Hemoglobin 13.0 - 17.0 g/dL 87.0  86.8     87.2      HCT 39.0 - 52.0 % 38.0  41     39      Platelets 150 - 400  K/uL  166     164      NEUT#   3,204.00     3,096.00         This result is from an external source.     There is no height or weight on file to calculate BMI.  Orders:  No orders of the defined types were placed in this encounter.  No orders of the defined types were placed in this encounter.    Procedures: No procedures performed  Clinical Data: No additional findings.  ROS:  All other systems negative, except as noted in the HPI. Review of Systems  Objective: Vital Signs: There were no vitals taken for this visit.  Specialty Comments:  No specialty comments available.  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) 08/27/2023   ILD (interstitial lung disease) (HCC) 07/26/2023    PAD (peripheral artery disease) 07/23/2023   OSA (obstructive sleep apnea) 07/23/2023   BPH (benign prostatic hyperplasia) 07/23/2023   Trimalleolar fracture of ankle, closed, left, initial encounter 07/20/2023   Malignant neoplasm of prostate (HCC) 11/04/2022   Complicated UTI (urinary tract infection) 09/06/2021   Cough 01/07/2019   Labile blood glucose    SOB (shortness of breath)    Urinary frequency    Hypoalbuminemia due to protein-calorie malnutrition (HCC)    Stage 3a chronic kidney disease (CKD) (HCC)    Physical debility 12/01/2018   Pressure injury of skin 11/27/2018   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   DMII (diabetes mellitus, type 2) (HCC) 11/03/2018   Essential hypertension 11/03/2018   Claudication in peripheral vascular disease 06/10/2016   Morbid obesity (HCC) 09/21/2015   Upper airway cough syndrome 09/20/2015   Cough variant asthma 09/20/2015   Past Medical History:  Diagnosis Date   Arthritis    CHF (congestive heart failure) (HCC)    per pt's daughter   CKD (chronic kidney disease)    CKD stage III (01/2018)   Daytime somnolence    Diabetes mellitus without complication (HCC)    Fracture    right fibula/wears boot cast   GERD (gastroesophageal reflux disease)    Glaucoma    Hypercholesteremia    Hyperlipidemia    Hypertension    Inclusion body myositis    diagnosed 2020   Mold exposure    7 yrs ago   PAD (peripheral artery disease)    Prostate cancer (HCC)    Sleep apnea    uses cpap   Uses walker    Wears glasses     Family History  Problem Relation Age of Onset   Heart disease Father    Prostate cancer Brother    Cancer Brother    Diabetes Brother    Colon cancer Neg Hx    Sleep apnea Neg Hx     Past Surgical History:  Procedure Laterality Date   ABDOMINAL AORTOGRAM N/A 11/13/2023   Procedure: ABDOMINAL AORTOGRAM;  Surgeon: Lanis Fonda BRAVO, MD;  Location: [facility] INVASIVE CV LAB;  Service: Cardiovascular;  Laterality: N/A;   ALLOGRAFT  APPLICATION Left 08/27/2023   Procedure: APPLICATION, ALLOGRAFT, SKIN;  Surgeon: Beverley Evalene BIRCH, MD;  Location: [facility] OR;  Service: Orthopedics;  Laterality: Left;   APPLICATION OF WOUND VAC Left 08/27/2023   Procedure: APPLICATION, WOUND VAC;  Surgeon: Beverley Evalene BIRCH, MD;  Location: [facility] OR;  Service: Orthopedics;  Laterality: Left;   GOLD SEED IMPLANT N/A 01/08/2023   Procedure: GOLD SEED IMPLANT;  Surgeon: Lovie Arlyss CROME, MD;  Location: [facility];  Service: Urology;  Laterality: N/A;  30 MINUTES   HARDWARE REMOVAL Left 08/27/2023   Procedure: REMOVAL, HARDWARE;  Surgeon: Beverley Evalene BIRCH, MD;  Location: [facility] OR;  Service: Orthopedics;  Laterality: Left;   INCISION AND DRAINAGE OF DEEP ABSCESS, ANKLE Left 08/27/2023   Procedure: INCISION AND DRAINAGE OF DEEP ABSCESS, ANKLE;  Surgeon: Beverley Evalene BIRCH, MD;  Location: [facility] OR;  Service: Orthopedics;  Laterality: Left;   LOWER EXTREMITY ANGIOGRAPHY N/A 06/12/2016   Procedure: Lower Extremity Angiography;  Surgeon: Gordy Bergamo, MD;  Location: [facility] INVASIVE CV LAB;  Service: Cardiovascular;  Laterality: N/A;   LOWER EXTREMITY ANGIOGRAPHY Left 11/13/2023   Procedure: Lower Extremity Angiography;  Surgeon: Lanis Fonda BRAVO, MD;  Location: [facility] INVASIVE CV LAB;  Service: Cardiovascular;  Laterality: Left;   LOWER EXTREMITY INTERVENTION Left 11/13/2023   Procedure: LOWER EXTREMITY INTERVENTION;  Surgeon: Lanis Fonda BRAVO, MD;  Location: [facility] INVASIVE CV LAB;  Service: Cardiovascular;  Laterality: Left;   MUSCLE BIOPSY Right 05/06/2018   Procedure: right vastus lateralis muscle biopsy;  Surgeon: Louis Shove, MD;  Location: [facility] OR;  Service: Neurosurgery;  Laterality: Right;   ORIF ANKLE FRACTURE Right 10/02/2012   Procedure: OPEN REDUCTION INTERNAL FIXATION (ORIF) ANKLE FRACTURE;  Surgeon: Cordella Glendia Hutchinson, MD;  Location: [facility] ORS;  Service: Orthopedics;  Laterality: Right;   ORIF ANKLE FRACTURE Left 07/21/2023   Procedure: OPEN REDUCTION INTERNAL  FIXATION (ORIF) ANKLE FRACTURE;  Surgeon: Beverley Evalene BIRCH, MD;  Location: [facility] ORS;  Service: Orthopedics;  Laterality: Left;   PERIPHERAL VASCULAR ATHERECTOMY Left 06/12/2016   Procedure: Peripheral Vascular Atherectomy-Left Popliteal;  Surgeon: Gordy Bergamo, MD;  Location: [facility] INVASIVE CV LAB;  Service: Cardiovascular;  Laterality: Left;   PERIPHERAL VASCULAR INTERVENTION Left 06/12/2016   Procedure: Peripheral Vascular Intervention- DCB Left Popliteal;  Surgeon: Gordy Bergamo, MD;  Location: [facility] INVASIVE CV LAB;  Service: Cardiovascular;  Laterality: Left;   PROSTATE BIOPSY     ROTATION FLAP Left 08/27/2023   Procedure: CREATION, FLAP, ROTATION;  Surgeon: Beverley Evalene BIRCH, MD;  Location: [facility] OR;  Service: Orthopedics;  Laterality: Left;   SPACE OAR INSTILLATION N/A 01/08/2023   Procedure: SPACE OAR INSTILLATION;  Surgeon: Lovie Arlyss CROME, MD;  Location: [facility];  Service: Urology;  Laterality: N/A;   Social History   Occupational History   Not on file  Tobacco Use   Smoking status: Former    Current packs/day: 0.00    Average packs/day: 1 pack/day for 20.0 years (20.0 ttl pk-yrs)    Types: Cigarettes    Start date: 08/22/1961    Quit date: 08/22/1981    Years since quitting: 42.4   Smokeless tobacco: Never  Vaping Use   Vaping status: Never Used  Substance and Sexual Activity   Alcohol  use: No    Alcohol /week: 0.0 standard drinks of alcohol    Drug use: No   Sexual activity: Never

## 2024-01-28 ENCOUNTER — Encounter: Payer: Self-pay | Admitting: *Deleted

## 2024-01-29 ENCOUNTER — Ambulatory Visit: Payer: PPO | Admitting: Adult Health

## 2024-01-29 VITALS — BP 122/42 | HR 72 | Ht 71.0 in | Wt 250.0 lb

## 2024-01-29 DIAGNOSIS — G7241 Inclusion body myositis [IBM]: Secondary | ICD-10-CM

## 2024-01-29 DIAGNOSIS — G4733 Obstructive sleep apnea (adult) (pediatric): Secondary | ICD-10-CM | POA: Diagnosis not present

## 2024-01-29 NOTE — Progress Notes (Deleted)
 PATIENT: Andrew Larsen Molly DOB: [birth year]  REASON FOR VISIT: follow up HISTORY FROM: patient  Chief Complaint  Patient presents with   Follow-up    Rm 18,  wife.       HISTORY OF PRESENT ILLNESS: Today 01/29/24:  Andrew Larsen is a 83 y.o. male with a history of obstructive apnea on CPAP. Returns today for follow-up.  He reports that the CPAP works well although he still feels that he is not getting enough it pressure.  At the last visit I asked for his pressure to be changed to 12 however this was never done by his DME company.  We will again send an order to adjust the pressure  When the patient initially saw Dr. Buck a muscle biopsy was performed that showed significant peripheral neuropathy.  He was referred to [facility] for evaluation.  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  His last follow-up was in 2020.  He is wife states that they were not set up for any additional appointments.  However, she is not sure that the patient needs to follow-up there as there is not a lot of treatment options.  She would like to keep his neurology care locally.  She states that he does have muscle weakness and has falls.  His wife would like him to do another round of physical therapy.       01/28/22: Mr. Andrew Larsen is a 83 year old male with a history of obstructive sleep apnea on CPAP.  He returns today for follow-up.  His download indicates that he uses machine 28 out of 30 days for compliance of 93.3%.  He used it greater than 4 hours for compliance of 90%.  Average usage is 5 hours and 38 minutes.  His residual AHI is 5.1 on 11 cm of water with a ramp pressure of 4 cm of water.  He states that he does not feel that he is getting enough air.  Previous machine was set at 12 cm of water.  02/15/21: Mr. Andrew Larsen is a 83 year old male with a history of obstructive sleep apnea on CPAP.  He returns today for follow-up.  He reports that the CPAP is working well.  He denies any new issues.  He  returns today for an evaluation.    08/15/20: Mr. Andrew Larsen is a 83 year old male with a history of obstructive sleep apnea on CPAP.  His download indicates that he uses machine 24 out of 30 days for compliance of 80%.  He uses machine greater than 4 hours for compliance of 60%.  On average he uses his machine 4 hours and 41 minutes.  His residual AHI is 6.9 on 11 cm of water.  Reports that some night he has to take the machine off because he feels as if he is not getting enough pressure.  He returns today for an evaluation.  HISTORY 02/15/20: He reports more weakness in his legs.  He fell a few weeks ago as he lost his balance but he also was not using his walker.  He has been using a 2 wheeled walker consistently.  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  Unfortunately, he contracted COVID-19 and was hospitalized from November 03 2018 through August third 2020 and subsequent inpatient rehab from August third 2020 through August 24th 2020.  He is followed by cardiology and pulmonology.  He has not seen his [facility] neurologist in several months.  He would like to get some  outpatient physical therapy.  He has not used his CPAP machine after he found out that his machine was on recall.  He has registered his machine on the website but has not talked to his DME company about a replacement yet.  He has used an ozone based cleaning machine on his CPAP machine and is advised to no longer use it for now until we have a better sense for when he gets a replacement machine.  He does admit that he does not sleep as well without his machine.     REVIEW OF SYSTEMS: Out of a complete 14 system review of symptoms, the patient complains only of the following symptoms, and all other reviewed systems are negative.   ESS 13  ALLERGIES: Allergies  Allergen Reactions   Metformin And Related Shortness Of Breath   Pollen Extract Itching and Other (See Comments)    Some wheezing, itchy eyes, runny  nose   Simbrinza [Brinzolamide-Brimonidine ] Other (See Comments)    Drainage, redness to eyes    HOME MEDICATIONS: Outpatient Medications Prior to Visit  Medication Sig Dispense Refill   aspirin  EC 81 MG tablet Take 1 tablet (81 mg total) by mouth daily. Swallow whole.     clopidogrel  (PLAVIX ) 75 MG tablet Take 1 tablet (75 mg total) by mouth daily. 90 tablet 3   Continuous Blood Gluc Sensor (FREESTYLE LIBRE 2 SENSOR) MISC Inject 1 Device into the skin every 14 (fourteen) days.     Continuous Glucose Receiver (FREESTYLE LIBRE 2 READER) DEVI USE UNDER THE SKIN ONCE DAILY AS DIRECTED; Duration: 90     docusate sodium  (COLACE) 100 MG capsule Take 1 capsule (100 mg total) by mouth 2 (two) times daily as needed for mild constipation.     dorzolamide  (TRUSOPT ) 2 % ophthalmic solution Place 1 drop into the right eye 2 (two) times daily.     ezetimibe  (ZETIA ) 10 MG tablet TAKE 1 TABLET(10 MG) BY MOUTH DAILY AFTER SUPPER 90 tablet 1   furosemide  (LASIX ) 40 MG tablet Take 1 tablet (40 mg total) by mouth daily. 30 tablet 0   gabapentin  (NEURONTIN ) 300 MG capsule Take 2 capsules (600 mg total) by mouth 3 (three) times daily. 90 capsule 0   latanoprost  (XALATAN ) 0.005 % ophthalmic solution Place 1 drop into both eyes at bedtime.     losartan  (COZAAR ) 25 MG tablet TAKE 1 TABLET(25 MG) BY MOUTH DAILY 30 tablet 2   metoprolol  tartrate (LOPRESSOR ) 25 MG tablet Take 1 tablet (25 mg total) by mouth 2 (two) times daily. 60 tablet 0   Multiple Vitamin (MULTIVITAMIN WITH MINERALS) TABS tablet Take 1 tablet by mouth daily with breakfast.     NOVOLIN 70/30 (70-30) 100 UNIT/ML injection Inject 50 Units into the skin in the morning and at bedtime.     polyethylene glycol (MIRALAX  / GLYCOLAX ) 17 g packet Take 17 g by mouth daily as needed.     PRESCRIPTION MEDICATION CPAP- At bedtime     rosuvastatin  (CRESTOR ) 20 MG tablet Take 1 tablet (20 mg total) by mouth daily. 30 tablet 11   tamsulosin  (FLOMAX ) 0.4 MG CAPS  capsule Take 0.4 mg by mouth daily.     timolol  (TIMOPTIC ) 0.5 % ophthalmic solution Place 1 drop into the right eye 2 (two) times daily.     oxyCODONE  (OXY IR/ROXICODONE ) 5 MG immediate release tablet Take 1 tablet (5 mg total) by mouth every 6 (six) hours as needed for severe pain (pain score 7-10). (Patient not taking: Reported  on 01/29/2024) 30 tablet 0   senna (SENOKOT) 8.6 MG TABS tablet Take 2 tablets by mouth at bedtime.     zolpidem  (AMBIEN ) 5 MG tablet Take 5 mg by mouth at bedtime as needed for sleep.     No facility-administered medications prior to visit.    PAST MEDICAL HISTORY: Past Medical History:  Diagnosis Date   Arthritis    CHF (congestive heart failure) (HCC)    per pt's daughter   CKD (chronic kidney disease)    CKD stage III (01/2018)   Daytime somnolence    Diabetes mellitus without complication (HCC)    Fracture    right fibula/wears boot cast   GERD (gastroesophageal reflux disease)    Glaucoma    Hypercholesteremia    Hyperlipidemia    Hypertension    Inclusion body myositis    diagnosed 2020   Mold exposure    7 yrs ago   PAD (peripheral artery disease)    Prostate cancer (HCC)    Sleep apnea    uses cpap   Uses walker    Wears glasses     PAST SURGICAL HISTORY: Past Surgical History:  Procedure Laterality Date   ABDOMINAL AORTOGRAM N/A 11/13/2023   Procedure: ABDOMINAL AORTOGRAM;  Surgeon: Lanis Fonda BRAVO, MD;  [facility] INVASIVE CV LAB;  Service: Cardiovascular;  Laterality: N/A;   ALLOGRAFT APPLICATION Left 08/27/2023   Procedure: APPLICATION, ALLOGRAFT, SKIN;  Surgeon: Beverley Evalene BIRCH, MD;  Location: [facility] OR;  Service: Orthopedics;  Laterality: Left;   APPLICATION OF WOUND VAC Left 08/27/2023   Procedure: APPLICATION, WOUND VAC;  Surgeon: Beverley Evalene BIRCH, MD;  Location: [facility] OR;  Service: Orthopedics;  Laterality: Left;   GOLD SEED IMPLANT N/A 01/08/2023   Procedure: GOLD SEED IMPLANT;  Surgeon: Lovie Arlyss CROME, MD;  Location: [facility];  Service: Urology;  Laterality: N/A;  30 MINUTES   HARDWARE REMOVAL Left 08/27/2023   Procedure: REMOVAL, HARDWARE;  Surgeon: Beverley Evalene BIRCH, MD;  Location: [facility] OR;  Service: Orthopedics;  Laterality: Left;   INCISION AND DRAINAGE OF DEEP ABSCESS, ANKLE Left 08/27/2023   Procedure: INCISION AND DRAINAGE OF DEEP ABSCESS, ANKLE;  Surgeon: Beverley Evalene BIRCH, MD;  Location: [facility] OR;  Service: Orthopedics;  Laterality: Left;   LOWER EXTREMITY ANGIOGRAPHY N/A 06/12/2016   Procedure: Lower Extremity Angiography;  Surgeon: Gordy Bergamo, MD;  Location: [facility] INVASIVE CV LAB;  Service: Cardiovascular;  Laterality: N/A;   LOWER EXTREMITY ANGIOGRAPHY Left 11/13/2023   Procedure: Lower Extremity Angiography;  Surgeon: Lanis Fonda BRAVO, MD;  Location: [facility] INVASIVE CV LAB;  Service: Cardiovascular;  Laterality: Left;   LOWER EXTREMITY INTERVENTION Left 11/13/2023   Procedure: LOWER EXTREMITY INTERVENTION;  Surgeon: Lanis Fonda BRAVO, MD;  Location: [facility] INVASIVE CV LAB;  Service: Cardiovascular;  Laterality: Left;   MUSCLE BIOPSY Right 05/06/2018   Procedure: right vastus lateralis muscle biopsy;  Surgeon: Louis Shove, MD;  Location: [facility] OR;  Service: Neurosurgery;  Laterality: Right;   ORIF ANKLE FRACTURE Right 10/02/2012   Procedure: OPEN REDUCTION INTERNAL FIXATION (ORIF) ANKLE FRACTURE;  Surgeon: Cordella Glendia Hutchinson, MD;  Location: [facility] ORS;  Service: Orthopedics;  Laterality: Right;   ORIF ANKLE FRACTURE Left 07/21/2023   Procedure: OPEN REDUCTION INTERNAL FIXATION (ORIF) ANKLE FRACTURE;  Surgeon: Beverley Evalene BIRCH, MD;  Location: [facility] ORS;  Service: Orthopedics;  Laterality: Left;   PERIPHERAL VASCULAR ATHERECTOMY Left 06/12/2016   Procedure: Peripheral Vascular Atherectomy-Left Popliteal;  Surgeon: Gordy Bergamo, MD;  Location: [facility] INVASIVE CV  LAB;  Service: Cardiovascular;  Laterality: Left;   PERIPHERAL VASCULAR INTERVENTION Left 06/12/2016   Procedure: Peripheral Vascular Intervention- DCB Left Popliteal;   Surgeon: Gordy Bergamo, MD;  Location: [facility] INVASIVE CV LAB;  Service: Cardiovascular;  Laterality: Left;   PROSTATE BIOPSY     ROTATION FLAP Left 08/27/2023   Procedure: CREATION, FLAP, ROTATION;  Surgeon: Beverley Evalene BIRCH, MD;  Location: [facility] OR;  Service: Orthopedics;  Laterality: Left;   SPACE OAR INSTILLATION N/A 01/08/2023   Procedure: SPACE OAR INSTILLATION;  Surgeon: Lovie Arlyss CROME, MD;  Location: [facility];  Service: Urology;  Laterality: N/A;    FAMILY HISTORY: Family History  Problem Relation Age of Onset   Heart disease Father    Prostate cancer Brother    Cancer Brother    Diabetes Brother    Colon cancer Neg Hx    Sleep apnea Neg Hx     SOCIAL HISTORY: Social History   Socioeconomic History   Marital status: Widowed    Spouse name: Not on file   Number of children: 1   Years of education: Not on file   Highest education level: Not on file  Occupational History   Not on file  Tobacco Use   Smoking status: Former    Current packs/day: 0.00    Average packs/day: 1 pack/day for 20.0 years (20.0 ttl pk-yrs)    Types: Cigarettes    Start date: 08/22/1961    Quit date: 08/22/1981    Years since quitting: 42.4   Smokeless tobacco: Never  Vaping Use   Vaping status: Never Used  Substance and Sexual Activity   Alcohol  use: No    Alcohol /week: 0.0 standard drinks of alcohol    Drug use: No   Sexual activity: Never  Other Topics Concern   Not on file  Social History Narrative   ** Merged History Encounter **       Social Drivers of Health   Financial Resource Strain: Not on file  Food Insecurity: No Food Insecurity (08/27/2023)   Hunger Vital Sign    Worried About Running Out of Food in the Last Year: Never true    Ran Out of Food in the Last Year: Never true  Transportation Needs: No Transportation Needs (08/27/2023)   PRAPARE - Administrator, Civil Service (Medical): No    Lack of Transportation (Non-Medical): No  Physical Activity:  Not on file  Stress: Not on file  Social Connections: Moderately Integrated (08/27/2023)   Social Connection and Isolation Panel    Frequency of Communication with Friends and Family: More than three times a week    Frequency of Social Gatherings with Friends and Family: More than three times a week    Attends Religious Services: More than 4 times per year    Active Member of [employer] or Organizations: Yes    Attends Banker Meetings: More than 4 times per year    Marital Status: Widowed  Intimate Partner Violence: Not At Risk (08/27/2023)   Humiliation, Afraid, Rape, and Kick questionnaire    Fear of Current or Ex-Partner: No    Emotionally Abused: No    Physically Abused: No    Sexually Abused: No      PHYSICAL EXAM  Vitals:   01/29/24 1001  BP: (!) 122/42  Pulse: 72  Weight: 250 lb (113.4 kg)  Height: 5' 11 (1.803 m)    Body mass index is 34.87 kg/m.  Generalized: Well developed, in no acute  distress  Chest: Lungs clear to auscultation bilaterally  Neurological examination  Mentation: Alert oriented to time, place, history taking. Follows all commands speech and language fluent Cranial nerve II-XII: Extraocular movements were full, visual field were full on confrontational test Head turning and shoulder shrug  were normal and symmetric. Gait and station: uses a rollator when ambulating    DIAGNOSTIC DATA (LABS, IMAGING, TESTING) - I reviewed patient records, labs, notes, testing and imaging myself where available.  Lab Results  Component Value Date   WBC 6.0 10/07/2023   HGB 12.9 (L) 11/13/2023   HCT 38.0 (L) 11/13/2023   MCV 92.8 08/29/2023   PLT 166 10/07/2023      Component Value Date/Time   NA 141 11/13/2023 0628   NA 138 10/07/2023 0000   K 3.8 11/13/2023 0628   CL 103 11/13/2023 0628   CO2 24 (A) 10/07/2023 0000   GLUCOSE 98 11/13/2023 0628   BUN 23 11/13/2023 0628   BUN 24 (A) 10/07/2023 0000   CREATININE 1.30 (H) 11/13/2023 0628    CALCIUM  8.8 10/07/2023 0000   PROT 7.0 08/29/2023 0542   PROT 6.1 07/13/2019 0833   ALBUMIN 3.3 (L) 08/29/2023 0542   ALBUMIN 3.9 07/13/2019 0833   AST 27 08/29/2023 0542   ALT 24 08/29/2023 0542   ALKPHOS 94 08/29/2023 0542   BILITOT 0.7 08/29/2023 0542   BILITOT 0.3 07/13/2019 0833   GFRNONAA 56 (L) 08/29/2023 0542   GFRAA 49 (L) 02/15/2020 0921   Lab Results  Component Value Date   CHOL 157 02/15/2020   HDL 49 02/15/2020   LDLCALC 95 02/15/2020   TRIG 67 02/15/2020   CHOLHDL 2.7 11/08/2018   Lab Results  Component Value Date   HGBA1C 7.3 (H) 07/21/2023   No results found for: VITAMINB12 Lab Results  Component Value Date   TSH 3.910 07/13/2019      ASSESSMENT AND PLAN 83 y.o. year old male  has a past medical history of Arthritis, CHF (congestive heart failure) (HCC), CKD (chronic kidney disease), Daytime somnolence, Diabetes mellitus without complication (HCC), Fracture, GERD (gastroesophageal reflux disease), Glaucoma, Hypercholesteremia, Hyperlipidemia, Hypertension, Inclusion body myositis, Mold exposure, PAD (peripheral artery disease), Prostate cancer (HCC), Sleep apnea, Uses walker, and Wears glasses. here with:  OSA on CPAP   - CPAP compliance excellent - Residual AHI in normal range - We will increase pressure to 12 cm of H20 and change ramp to 5 - Encourage patient to use CPAP nightly and > 4 hours each night  2. Inclusion body myositis  -Today's visit was a follow-up for CPAP however he mentions that he is no longer going to [facility] for inclusion body myositis.  He does state that he would like to keep a local neurologist.  Wife is asking that I placed a referral for physical therapy. -I did discuss with Dr. Buck.  Dr. Athar would like to ask for a second opinion from one of our neuromuscular specialist. -Referral placed for physical therapy  Follow-up in 1 year for CPAP.    Duwaine Russell, MSN, NP-C 01/29/2024, 10:19 AM [facility] [address] [place], [place] [address] [phone]

## 2024-01-29 NOTE — Progress Notes (Signed)
 PATIENT: Andrew Larsen DOB: [birth year]  REASON FOR VISIT: follow up HISTORY FROM: patient  Chief Complaint  Patient presents with   Follow-up    Rm 18,  wife.       HISTORY OF PRESENT ILLNESS: Today 01/29/24:  Andrew Larsen is a 83 y.o. male with a history of obstructive sleep apnea on CPAP. Returns today for follow-up.  He reports that the CPAP is working well for him.  He does have a fullface mask.  Changed out his supplies regularly.  Denies any new issues.  At the last visit he stated that he wanted to see a local neurologist for inclusion body myositis.  We had advised that he could call back and schedule an appointment with Andrew Larsen and she had agreed to see him.  Patient did not call to make an appointment.  We discussed this again today.  He is still interested in seeing Andrew Larsen.  Patient CPAP download indicated he was using the machine nightly.  Using a greater than 4 hours each night.  His residual AHI was down to approximately 2 events an hour.  Does not have a significant leak.     01/29/23: Andrew Larsen is a 83 y.o. male with a history of obstructive apnea on CPAP. Returns today for follow-up.  He reports that the CPAP works well although he still feels that he is not getting enough it pressure.  At the last visit I asked for his pressure to be changed to 12 however this was never done by his DME company.  We will again send an order to adjust the pressure  When the patient initially saw Andrew Larsen a muscle biopsy was performed that showed significant peripheral neuropathy.  He was referred to [facility] for evaluation.  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  His last follow-up was in 2020.  He is wife states that they were not set up for any additional appointments.  However, she is not sure that the patient needs to follow-up there as there is not a lot of treatment options.  She would like to keep his neurology care locally.  She states that he does have  muscle weakness and has falls.  His wife would like him to do another round of physical therapy.       01/28/22: Andrew Larsen is a 83 year old male with a history of obstructive sleep apnea on CPAP.  He returns today for follow-up.  His download indicates that he uses machine 28 out of 30 days for compliance of 93.3%.  He used it greater than 4 hours for compliance of 90%.  Average usage is 5 hours and 38 minutes.  His residual AHI is 5.1 on 11 cm of water with a ramp pressure of 4 cm of water.  He states that he does not feel that he is getting enough air.  Previous machine was set at 12 cm of water.  02/15/21: Andrew Larsen is a 83 year old male with a history of obstructive sleep apnea on CPAP.  He returns today for follow-up.  He reports that the CPAP is working well.  He denies any new issues.  He returns today for an evaluation.    08/15/20: Andrew Larsen is a 83 year old male with a history of obstructive sleep apnea on CPAP.  His download indicates that he uses machine 24 out of 30 days for compliance of 80%.  He uses machine greater than 4 hours for compliance of 60%.  On average he uses his machine 4 hours and 41 minutes.  His residual AHI is 6.9 on 11 cm of water.  Reports that some night he has to take the machine off because he feels as if he is not getting enough pressure.  He returns today for an evaluation.  HISTORY 02/15/20: He reports more weakness in his legs.  He fell a few weeks ago as he lost his balance but he also was not using his walker.  He has been using a 2 wheeled walker consistently.  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  Unfortunately, he contracted COVID-19 and was hospitalized from November 03 2018 through August third 2020 and subsequent inpatient rehab from August third 2020 through August 24th 2020.  He is followed by cardiology and pulmonology.  He has not seen his [facility] neurologist in several months.  He would like to get some outpatient  physical therapy.  He has not used his CPAP machine after he found out that his machine was on recall.  He has registered his machine on the website but has not talked to his DME company about a replacement yet.  He has used an ozone based cleaning machine on his CPAP machine and is advised to no longer use it for now until we have a better sense for when he gets a replacement machine.  He does admit that he does not sleep as well without his machine.     REVIEW OF SYSTEMS: Out of a complete 14 system review of symptoms, the patient complains only of the following symptoms, and all other reviewed systems are negative.   ESS 13  ALLERGIES: Allergies  Allergen Reactions   Metformin And Related Shortness Of Breath   Pollen Extract Itching and Other (See Comments)    Some wheezing, itchy eyes, runny nose   Simbrinza [Brinzolamide-Brimonidine ] Other (See Comments)    Drainage, redness to eyes    HOME MEDICATIONS: Outpatient Medications Prior to Visit  Medication Sig Dispense Refill   aspirin  EC 81 MG tablet Take 1 tablet (81 mg total) by mouth daily. Swallow whole.     clopidogrel  (PLAVIX ) 75 MG tablet Take 1 tablet (75 mg total) by mouth daily. 90 tablet 3   Continuous Blood Gluc Sensor (FREESTYLE LIBRE 2 SENSOR) MISC Inject 1 Device into the skin every 14 (fourteen) days.     Continuous Glucose Receiver (FREESTYLE LIBRE 2 READER) DEVI USE UNDER THE SKIN ONCE DAILY AS DIRECTED; Duration: 90     docusate sodium  (COLACE) 100 MG capsule Take 1 capsule (100 mg total) by mouth 2 (two) times daily as needed for mild constipation.     dorzolamide  (TRUSOPT ) 2 % ophthalmic solution Place 1 drop into the right eye 2 (two) times daily.     ezetimibe  (ZETIA ) 10 MG tablet TAKE 1 TABLET(10 MG) BY MOUTH DAILY AFTER SUPPER 90 tablet 1   furosemide  (LASIX ) 40 MG tablet Take 1 tablet (40 mg total) by mouth daily. 30 tablet 0   gabapentin  (NEURONTIN ) 300 MG capsule Take 2 capsules (600 mg total) by mouth 3  (three) times daily. 90 capsule 0   latanoprost  (XALATAN ) 0.005 % ophthalmic solution Place 1 drop into both eyes at bedtime.     losartan  (COZAAR ) 25 MG tablet TAKE 1 TABLET(25 MG) BY MOUTH DAILY 30 tablet 2   metoprolol  tartrate (LOPRESSOR ) 25 MG tablet Take 1 tablet (25 mg total) by mouth 2 (two) times daily. 60 tablet 0  Multiple Vitamin (MULTIVITAMIN WITH MINERALS) TABS tablet Take 1 tablet by mouth daily with breakfast.     NOVOLIN 70/30 (70-30) 100 UNIT/ML injection Inject 50 Units into the skin in the morning and at bedtime.     polyethylene glycol (MIRALAX  / GLYCOLAX ) 17 g packet Take 17 g by mouth daily as needed.     PRESCRIPTION MEDICATION CPAP- At bedtime     rosuvastatin  (CRESTOR ) 20 MG tablet Take 1 tablet (20 mg total) by mouth daily. 30 tablet 11   tamsulosin  (FLOMAX ) 0.4 MG CAPS capsule Take 0.4 mg by mouth daily.     timolol  (TIMOPTIC ) 0.5 % ophthalmic solution Place 1 drop into the right eye 2 (two) times daily.     oxyCODONE  (OXY IR/ROXICODONE ) 5 MG immediate release tablet Take 1 tablet (5 mg total) by mouth every 6 (six) hours as needed for severe pain (pain score 7-10). (Patient not taking: Reported on 01/29/2024) 30 tablet 0   senna (SENOKOT) 8.6 MG TABS tablet Take 2 tablets by mouth at bedtime.     zolpidem  (AMBIEN ) 5 MG tablet Take 5 mg by mouth at bedtime as needed for sleep.     No facility-administered medications prior to visit.    PAST MEDICAL HISTORY: Past Medical History:  Diagnosis Date   Arthritis    CHF (congestive heart failure) (HCC)    per pt's daughter   CKD (chronic kidney disease)    CKD stage III (01/2018)   Daytime somnolence    Diabetes mellitus without complication (HCC)    Fracture    right fibula/wears boot cast   GERD (gastroesophageal reflux disease)    Glaucoma    Hypercholesteremia    Hyperlipidemia    Hypertension    Inclusion body myositis    diagnosed 2020   Mold exposure    7 yrs ago   PAD (peripheral artery disease)     Prostate cancer (HCC)    Sleep apnea    uses cpap   Uses walker    Wears glasses     PAST SURGICAL HISTORY: Past Surgical History:  Procedure Laterality Date   ABDOMINAL AORTOGRAM N/A 11/13/2023   Procedure: ABDOMINAL AORTOGRAM;  Surgeon: Lanis Fonda BRAVO, MD;  Location[facility] INVASIVE CV LAB;  Service: Cardiovascular;  Laterality: N/A;   ALLOGRAFT APPLICATION Left 08/27/2023   Procedure: APPLICATION, ALLOGRAFT, SKIN;  Surgeon: Beverley Evalene BIRCH, MD;  Location: [facility] OR;  Service: Orthopedics;  Laterality: Left;   APPLICATION OF WOUND VAC Left 08/27/2023   Procedure: APPLICATION, WOUND VAC;  Surgeon: Beverley Evalene BIRCH, MD;  Location: [facility] OR;  Service: Orthopedics;  Laterality: Left;   GOLD SEED IMPLANT N/A 01/08/2023   Procedure: GOLD SEED IMPLANT;  Surgeon: Lovie Arlyss CROME, MD;  Location: [facility];  Service: Urology;  Laterality: N/A;  30 MINUTES   HARDWARE REMOVAL Left 08/27/2023   Procedure: REMOVAL, HARDWARE;  Surgeon: Beverley Evalene BIRCH, MD;  Location: [facility] OR;  Service: Orthopedics;  Laterality: Left;   INCISION AND DRAINAGE OF DEEP ABSCESS, ANKLE Left 08/27/2023   Procedure: INCISION AND DRAINAGE OF DEEP ABSCESS, ANKLE;  Surgeon: Beverley Evalene BIRCH, MD;  Location: [facility] OR;  Service: Orthopedics;  Laterality: Left;   LOWER EXTREMITY ANGIOGRAPHY N/A 06/12/2016   Procedure: Lower Extremity Angiography;  Surgeon: Gordy Bergamo, MD;  Location: [facility] INVASIVE CV LAB;  Service: Cardiovascular;  Laterality: N/A;   LOWER EXTREMITY ANGIOGRAPHY Left 11/13/2023   Procedure: Lower Extremity Angiography;  Surgeon: Lanis Fonda BRAVO, MD;  Location: [facility] INVASIVE  CV LAB;  Service: Cardiovascular;  Laterality: Left;   LOWER EXTREMITY INTERVENTION Left 11/13/2023   Procedure: LOWER EXTREMITY INTERVENTION;  Surgeon: Lanis Fonda BRAVO, MD;  Location: [facility] INVASIVE CV LAB;  Service: Cardiovascular;  Laterality: Left;   MUSCLE BIOPSY Right 05/06/2018   Procedure: right vastus lateralis muscle biopsy;  Surgeon: Louis Shove, MD;  Location: [facility] OR;  Service: Neurosurgery;  Laterality: Right;   ORIF ANKLE FRACTURE Right 10/02/2012   Procedure: OPEN REDUCTION INTERNAL FIXATION (ORIF) ANKLE FRACTURE;  Surgeon: Cordella Glendia Hutchinson, MD;  Location: [facility] ORS;  Service: Orthopedics;  Laterality: Right;   ORIF ANKLE FRACTURE Left 07/21/2023   Procedure: OPEN REDUCTION INTERNAL FIXATION (ORIF) ANKLE FRACTURE;  Surgeon: Beverley Evalene BIRCH, MD;  Location: [facility] ORS;  Service: Orthopedics;  Laterality: Left;   PERIPHERAL VASCULAR ATHERECTOMY Left 06/12/2016   Procedure: Peripheral Vascular Atherectomy-Left Popliteal;  Surgeon: Gordy Bergamo, MD;  Location: [facility] INVASIVE CV LAB;  Service: Cardiovascular;  Laterality: Left;   PERIPHERAL VASCULAR INTERVENTION Left 06/12/2016   Procedure: Peripheral Vascular Intervention- DCB Left Popliteal;  Surgeon: Gordy Bergamo, MD;  Location: [facility] INVASIVE CV LAB;  Service: Cardiovascular;  Laterality: Left;   PROSTATE BIOPSY     ROTATION FLAP Left 08/27/2023   Procedure: CREATION, FLAP, ROTATION;  Surgeon: Beverley Evalene BIRCH, MD;  Location: [facility] OR;  Service: Orthopedics;  Laterality: Left;   SPACE OAR INSTILLATION N/A 01/08/2023   Procedure: SPACE OAR INSTILLATION;  Surgeon: Lovie Arlyss CROME, MD;  Location: [facility];  Service: Urology;  Laterality: N/A;    FAMILY HISTORY: Family History  Problem Relation Age of Onset   Heart disease Father    Prostate cancer Brother    Cancer Brother    Diabetes Brother    Colon cancer Neg Hx    Sleep apnea Neg Hx     SOCIAL HISTORY: Social History   Socioeconomic History   Marital status: Widowed    Spouse name: Not on file   Number of children: 1   Years of education: Not on file   Highest education level: Not on file  Occupational History   Not on file  Tobacco Use   Smoking status: Former    Current packs/day: 0.00    Average packs/day: 1 pack/day for 20.0 years (20.0 ttl pk-yrs)    Types: Cigarettes    Start date: 08/22/1961    Quit  date: 08/22/1981    Years since quitting: 42.4   Smokeless tobacco: Never  Vaping Use   Vaping status: Never Used  Substance and Sexual Activity   Alcohol  use: No    Alcohol /week: 0.0 standard drinks of alcohol    Drug use: No   Sexual activity: Never  Other Topics Concern   Not on file  Social History Narrative   ** Merged History Encounter **       Social Drivers of Health   Financial Resource Strain: Not on file  Food Insecurity: No Food Insecurity (08/27/2023)   Hunger Vital Sign    Worried About Running Out of Food in the Last Year: Never true    Ran Out of Food in the Last Year: Never true  Transportation Needs: No Transportation Needs (08/27/2023)   PRAPARE - Administrator, Civil Service (Medical): No    Lack of Transportation (Non-Medical): No  Physical Activity: Not on file  Stress: Not on file  Social Connections: Moderately Integrated (08/27/2023)   Social Connection and Isolation Panel    Frequency of Communication with Friends  and Family: More than three times a week    Frequency of Social Gatherings with Friends and Family: More than three times a week    Attends Religious Services: More than 4 times per year    Active Member of Clubs or Organizations: Yes    Attends Banker Meetings: More than 4 times per year    Marital Status: Widowed  Intimate Partner Violence: Not At Risk (08/27/2023)   Humiliation, Afraid, Rape, and Kick questionnaire    Fear of Current or Ex-Partner: No    Emotionally Abused: No    Physically Abused: No    Sexually Abused: No      PHYSICAL EXAM  Vitals:   01/29/24 1001  BP: (!) 122/42  Pulse: 72  Weight: 250 lb (113.4 kg)  Height: 5' 11 (1.803 m)    Body mass index is 34.87 kg/m.  Generalized: Well developed, in no acute distress  Chest: Lungs clear to auscultation bilaterally  Neurological examination  Mentation: Alert oriented to time, place, history taking. Follows all commands speech and  language fluent Cranial nerve II-XII: Extraocular movements were full, visual field were full on confrontational test Head turning and shoulder shrug  were normal and symmetric.    DIAGNOSTIC DATA (LABS, IMAGING, TESTING) - I reviewed patient records, labs, notes, testing and imaging myself where available.  Lab Results  Component Value Date   WBC 6.0 10/07/2023   HGB 12.9 (L) 11/13/2023   HCT 38.0 (L) 11/13/2023   MCV 92.8 08/29/2023   PLT 166 10/07/2023      Component Value Date/Time   NA 141 11/13/2023 0628   NA 138 10/07/2023 0000   K 3.8 11/13/2023 0628   CL 103 11/13/2023 0628   CO2 24 (A) 10/07/2023 0000   GLUCOSE 98 11/13/2023 0628   BUN 23 11/13/2023 0628   BUN 24 (A) 10/07/2023 0000   CREATININE 1.30 (H) 11/13/2023 0628   CALCIUM  8.8 10/07/2023 0000   PROT 7.0 08/29/2023 0542   PROT 6.1 07/13/2019 0833   ALBUMIN 3.3 (L) 08/29/2023 0542   ALBUMIN 3.9 07/13/2019 0833   AST 27 08/29/2023 0542   ALT 24 08/29/2023 0542   ALKPHOS 94 08/29/2023 0542   BILITOT 0.7 08/29/2023 0542   BILITOT 0.3 07/13/2019 0833   GFRNONAA 56 (L) 08/29/2023 0542   GFRAA 49 (L) 02/15/2020 0921   Lab Results  Component Value Date   CHOL 157 02/15/2020   HDL 49 02/15/2020   LDLCALC 95 02/15/2020   TRIG 67 02/15/2020   CHOLHDL 2.7 11/08/2018   Lab Results  Component Value Date   HGBA1C 7.3 (H) 07/21/2023   No results found for: VITAMINB12 Lab Results  Component Value Date   TSH 3.910 07/13/2019      ASSESSMENT AND PLAN 83 y.o. year old male  has a past medical history of Arthritis, CHF (congestive heart failure) (HCC), CKD (chronic kidney disease), Daytime somnolence, Diabetes mellitus without complication (HCC), Fracture, GERD (gastroesophageal reflux disease), Glaucoma, Hypercholesteremia, Hyperlipidemia, Hypertension, Inclusion body myositis, Mold exposure, PAD (peripheral artery disease), Prostate cancer (HCC), Sleep apnea, Uses walker, and Wears glasses. here  with:  OSA on CPAP   - CPAP compliance excellent - Residual AHI in normal range - Encourage patient to use CPAP nightly and > 4 hours each night  2. Inclusion body myositis  - Advised patient and after the last visit Andrew Larsen had agreed to see him for a second opinion.  He will make an appointment when he  leaves today to see her.  Follow-up in 1 year for CPAP.    Duwaine Russell, MSN, NP-C 01/29/2024, 10:41 AM [facility] [address] [place], [place] [address] [phone]

## 2024-02-17 ENCOUNTER — Ambulatory Visit: Admitting: Orthopedic Surgery

## 2024-02-17 DIAGNOSIS — I739 Peripheral vascular disease, unspecified: Secondary | ICD-10-CM | POA: Diagnosis not present

## 2024-02-17 DIAGNOSIS — T8131XS Disruption of external operation (surgical) wound, not elsewhere classified, sequela: Secondary | ICD-10-CM

## 2024-02-18 ENCOUNTER — Encounter: Payer: Self-pay | Admitting: Orthopedic Surgery

## 2024-02-18 NOTE — Progress Notes (Signed)
 Office Visit Note   Patient: Andrew Larsen           Date of Birth: May 03, 1940           MRN: [id number] Visit Date: 02/17/2024              Requested by: Larnell Hamilton, MD [address] [place],  [place] [address] PCP: Larnell Hamilton, MD  Chief Complaint  Patient presents with   Left Ankle - Follow-up      HPI: Discussed the use of AI scribe software for clinical note transcription with the patient, who gave verbal consent to proceed.  History of Present Illness Andrew Larsen is an 83 year old male who presents for follow-up of wound healing after left ankle surgery.  He underwent left ankle surgery in April and was seen four weeks ago for a dehiscence of the operative wound. At that time, the wound measured 0.5 by 1 centimeter with flat granulation tissue.  He reports ongoing swelling and discoloration of the skin at the site of his previous wound. He wears a compression sock to manage the swelling, which was present this morning.     Assessment & Plan: Visit Diagnoses:  1. PAD (peripheral artery disease)   2. Dehiscence of operative wound, sequela     Plan: Assessment and Plan Assessment & Plan Status post left ankle surgery with healed wound The wound from the left ankle surgery in April has healed. No cellulitis, odor, or drainage present. - Use soap and water for cleaning the area. - Leave the wound open without bandages. - Wear compression around the clock.  Left lower extremity swelling due to venous and lymphatic insufficiency Persistent swelling with brawny skin color changes due to venous and lymphatic insufficiency. Swelling expected to decrease with management. - Wear compression garments continuously, including overnight. - Elevate the leg as much as possible. - Engage in exercises such as stationary biking and walking to promote muscle contraction and reduce swelling.      Follow-Up Instructions: Return if symptoms worsen or fail to  improve.   Ortho Exam  Patient is alert, oriented, no adenopathy, well-dressed, normal affect, normal respiratory effort. Physical Exam EXTREMITIES: Fibular ulcer healed, swelling present. SKIN: Brawny skin color changes from venous and lymphatic insufficiency. No cellulitis, odor, or drainage.      Imaging: No results found. No images are attached to the encounter.  Labs: Lab Results  Component Value Date   HGBA1C 7.3 (H) 07/21/2023   HGBA1C 8.4 (H) 09/02/2021   HGBA1C 7.3 (H) 11/08/2018   ESRSEDRATE 22 10/07/2023   ESRSEDRATE 19 09/24/2023   ESRSEDRATE 34 09/16/2023   CRP 4.8 (H) 08/28/2023   CRP <0.8 11/27/2018   CRP <0.8 11/25/2018   REPTSTATUS 08/30/2023 FINAL 08/27/2023   GRAMSTAIN NO WBC SEEN NO ORGANISMS SEEN  08/27/2023   CULT ENTEROBACTER CLOACAE (A) 08/27/2023   LABORGA ENTEROBACTER CLOACAE 08/27/2023     Lab Results  Component Value Date   ALBUMIN 3.3 (L) 08/29/2023   ALBUMIN 3.1 (L) 08/28/2023   ALBUMIN 3.5 09/01/2021    Lab Results  Component Value Date   MG 2.2 08/29/2023   MG 1.9 09/05/2021   MG 1.7 09/03/2021   No results found for: VD25OH  No results found for: PREALBUMIN    Latest Ref Rng & Units 11/13/2023    6:28 AM 10/07/2023   12:00 AM 09/24/2023   12:00 AM  CBC EXTENDED  WBC   6.0  6.0      RBC 3.87 - 5.11  4.27     4.12      Hemoglobin 13.0 - 17.0 g/dL 87.0  86.8     87.2      HCT 39.0 - 52.0 % 38.0  41     39      Platelets 150 - 400 K/uL  166     164      NEUT#   3,204.00     3,096.00         This result is from an external source.     There is no height or weight on file to calculate BMI.  Orders:  No orders of the defined types were placed in this encounter.  No orders of the defined types were placed in this encounter.    Procedures: No procedures performed  Clinical Data: No additional findings.  ROS:  All other systems negative, except as noted in the HPI. Review of Systems  Objective: Vital  Signs: There were no vitals taken for this visit.  Specialty Comments:  No specialty comments available.  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 08/27/2023   ILD (interstitial lung disease) (HCC) 07/26/2023   PAD (peripheral artery disease) 07/23/2023   OSA (obstructive sleep apnea) 07/23/2023   BPH (benign prostatic hyperplasia) 07/23/2023   Trimalleolar fracture of ankle, closed, left, initial encounter 07/20/2023   Malignant neoplasm of prostate (HCC) 11/04/2022   Complicated UTI (urinary tract infection) 09/06/2021   Cough 01/07/2019   Labile blood glucose    SOB (shortness of breath)    Urinary frequency    Hypoalbuminemia due to protein-calorie malnutrition    Stage 3a chronic kidney disease (CKD) (HCC)    Physical debility 12/01/2018   Pressure injury of skin 11/27/2018   Inclusion body myositis (HCC) 11/07/2018   DMII (diabetes mellitus, type 2) (HCC) 11/03/2018   Essential hypertension 11/03/2018   Claudication in peripheral vascular disease 06/10/2016   Morbid obesity (HCC) 09/21/2015   Upper airway cough syndrome 09/20/2015   Cough variant asthma 09/20/2015   Past Medical History:  Diagnosis Date   Arthritis    CHF (congestive heart failure) (HCC)    per pt's daughter   CKD (chronic kidney disease)    CKD stage III (01/2018)   Daytime somnolence    Diabetes mellitus without complication (HCC)    Fracture    right fibula/wears boot cast   GERD (gastroesophageal reflux disease)    Glaucoma    Hypercholesteremia    Hyperlipidemia    Hypertension    Inclusion body myositis    diagnosed 2020   Mold exposure    7 yrs ago   PAD (peripheral artery disease)    Prostate cancer (HCC)    Sleep apnea    uses cpap   Uses walker    Wears glasses      Family History  Problem Relation Age of Onset   Heart disease Father    Prostate cancer Brother    Cancer Brother    Diabetes Brother    Colon cancer Neg Hx    Sleep apnea Neg Hx     Past Surgical History:  Procedure Laterality Date   ABDOMINAL AORTOGRAM N/A 11/13/2023   Procedure: ABDOMINAL AORTOGRAM;  Surgeon: Lanis Fonda BRAVO, MD;  Location: [facility];  Service: Cardiovascular;  Laterality: N/A;   ALLOGRAFT APPLICATION Left 08/27/2023   Procedure: APPLICATION, ALLOGRAFT, SKIN;  Surgeon: Beverley Evalene BIRCH, MD;  Location: [facility] OR;  Service: Orthopedics;  Laterality: Left;   APPLICATION OF WOUND VAC Left 08/27/2023   Procedure: APPLICATION, WOUND VAC;  Surgeon: Beverley Evalene BIRCH, MD;  Location: [facility] OR;  Service: Orthopedics;  Laterality: Left;   GOLD SEED IMPLANT N/A 01/08/2023   Procedure: GOLD SEED IMPLANT;  Surgeon: Lovie Arlyss CROME, MD;  Location: [facility];  Service: Urology;  Laterality: N/A;  30 MINUTES   HARDWARE REMOVAL Left 08/27/2023   Procedure: REMOVAL, HARDWARE;  Surgeon: Beverley Evalene BIRCH, MD;  Location: [facility] OR;  Service: Orthopedics;  Laterality: Left;   INCISION AND DRAINAGE OF DEEP ABSCESS, ANKLE Left 08/27/2023   Procedure: INCISION AND DRAINAGE OF DEEP ABSCESS, ANKLE;  Surgeon: Beverley Evalene BIRCH, MD;  Location: [facility] OR;  Service: Orthopedics;  Laterality: Left;   LOWER EXTREMITY ANGIOGRAPHY N/A 06/12/2016   Procedure: Lower Extremity Angiography;  Surgeon: Gordy Bergamo, MD;  Location: [facility] INVASIVE CV LAB;  Service: Cardiovascular;  Laterality: N/A;   LOWER EXTREMITY ANGIOGRAPHY Left 11/13/2023   Procedure: Lower Extremity Angiography;  Surgeon: Lanis Fonda BRAVO, MD;  Location: [facility] INVASIVE CV LAB;  Service: Cardiovascular;  Laterality: Left;   LOWER EXTREMITY INTERVENTION Left 11/13/2023   Procedure: LOWER EXTREMITY INTERVENTION;  Surgeon: Lanis Fonda BRAVO, MD;  Location: [facility] INVASIVE CV LAB;  Service: Cardiovascular;  Laterality: Left;   MUSCLE BIOPSY Right  05/06/2018   Procedure: right vastus lateralis muscle biopsy;  Surgeon: Louis Shove, MD;  Location: [facility] OR;  Service: Neurosurgery;  Laterality: Right;   ORIF ANKLE FRACTURE Right 10/02/2012   Procedure: OPEN REDUCTION INTERNAL FIXATION (ORIF) ANKLE FRACTURE;  Surgeon: Cordella Glendia Hutchinson, MD;  Location: [facility] ORS;  Service: Orthopedics;  Laterality: Right;   ORIF ANKLE FRACTURE Left 07/21/2023   Procedure: OPEN REDUCTION INTERNAL FIXATION (ORIF) ANKLE FRACTURE;  Surgeon: Beverley Evalene BIRCH, MD;  Location: [facility] ORS;  Service: Orthopedics;  Laterality: Left;   PERIPHERAL VASCULAR ATHERECTOMY Left 06/12/2016   Procedure: Peripheral Vascular Atherectomy-Left Popliteal;  Surgeon: Gordy Bergamo, MD;  Location: [facility] INVASIVE CV LAB;  Service: Cardiovascular;  Laterality: Left;   PERIPHERAL VASCULAR INTERVENTION Left 06/12/2016   Procedure: Peripheral Vascular Intervention- DCB Left Popliteal;  Surgeon: Gordy Bergamo, MD;  Location: [facility] INVASIVE CV LAB;  Service: Cardiovascular;  Laterality: Left;   PROSTATE BIOPSY     ROTATION FLAP Left 08/27/2023   Procedure: CREATION, FLAP, ROTATION;  Surgeon: Beverley Evalene BIRCH, MD;  Location: [facility] OR;  Service: Orthopedics;  Laterality: Left;   SPACE OAR INSTILLATION N/A 01/08/2023   Procedure: SPACE OAR INSTILLATION;  Surgeon: Lovie Arlyss CROME, MD;  Location: [facility];  Service: Urology;  Laterality: N/A;   Social History   Occupational History   Not on file  Tobacco Use   Smoking status: Former    Current packs/day: 0.00    Average packs/day: 1 pack/day for 20.0 years (20.0 ttl pk-yrs)    Types: Cigarettes    Start date: 08/22/1961    Quit date: 08/22/1981    Years since quitting: 42.5   Smokeless tobacco: Never  Vaping Use   Vaping status: Never Used  Substance and Sexual Activity   Alcohol  use: No    Alcohol /week: 0.0 standard drinks of alcohol    Drug use: No   Sexual activity: Never

## 2024-03-02 ENCOUNTER — Encounter: Payer: Self-pay | Admitting: Radiology

## 2024-03-02 DIAGNOSIS — H524 Presbyopia: Secondary | ICD-10-CM | POA: Diagnosis not present

## 2024-03-02 DIAGNOSIS — H401131 Primary open-angle glaucoma, bilateral, mild stage: Secondary | ICD-10-CM | POA: Diagnosis not present

## 2024-03-02 DIAGNOSIS — H2511 Age-related nuclear cataract, right eye: Secondary | ICD-10-CM | POA: Diagnosis not present

## 2024-03-05 ENCOUNTER — Ambulatory Visit (HOSPITAL_BASED_OUTPATIENT_CLINIC_OR_DEPARTMENT_OTHER)
Admission: RE | Admit: 2024-03-05 | Discharge: 2024-03-05 | Disposition: A | Discharge status: Home / Self Care | Source: Ambulatory Visit | Attending: Vascular Surgery | Admitting: Vascular Surgery

## 2024-03-05 ENCOUNTER — Ambulatory Visit (HOSPITAL_COMMUNITY)
Admission: RE | Admit: 2024-03-05 | Discharge: 2024-03-05 | Disposition: A | Discharge status: Home / Self Care | Source: Ambulatory Visit | Attending: Vascular Surgery | Admitting: Vascular Surgery

## 2024-03-05 ENCOUNTER — Ambulatory Visit: Admitting: Physician Assistant

## 2024-03-05 ENCOUNTER — Encounter: Payer: Self-pay | Admitting: Physician Assistant

## 2024-03-05 VITALS — BP 136/82 | HR 73 | Temp 97.7°F

## 2024-03-05 DIAGNOSIS — I739 Peripheral vascular disease, unspecified: Secondary | ICD-10-CM

## 2024-03-05 DIAGNOSIS — I70245 Atherosclerosis of native arteries of left leg with ulceration of other part of foot: Secondary | ICD-10-CM | POA: Insufficient documentation

## 2024-03-05 LAB — VAS US ABI WITH/WO TBI: Right ABI: 0.83

## 2024-03-06 ENCOUNTER — Other Ambulatory Visit: Payer: Self-pay | Admitting: *Deleted

## 2024-03-06 DIAGNOSIS — I739 Peripheral vascular disease, unspecified: Secondary | ICD-10-CM

## 2024-03-06 DIAGNOSIS — I70245 Atherosclerosis of native arteries of left leg with ulceration of other part of foot: Secondary | ICD-10-CM

## 2024-03-13 NOTE — Progress Notes (Signed)
 Office Note   History of Present Illness   Andrew Larsen is a 83 y.o. (01-06-1941) male who presents for follow-up.  He recently underwent left lower extremity angiogram with popliteal artery, distal posterior tibial artery, and lateral plantar artery balloon angioplasty and popliteal artery Viabahn stenting on 11/13/2023 by Dr. Lanis.  This was done for critical limb ischemia with tissue loss at the ankle after poor healing from an ORIF.  He returns today for follow-up.  He has no complaints at today's office visit.  He states that his left ankle wound has now completely healed.  He denies any other tissue loss.  He also denies any rest pain or claudication.  Current Outpatient Medications  Medication Sig Dispense Refill   aspirin  EC 81 MG tablet Take 1 tablet (81 mg total) by mouth daily. Swallow whole.     clopidogrel  (PLAVIX ) 75 MG tablet Take 1 tablet (75 mg total) by mouth daily. 90 tablet 3   Continuous Blood Gluc Sensor (FREESTYLE LIBRE 2 SENSOR) MISC Inject 1 Device into the skin every 14 (fourteen) days.     Continuous Glucose Receiver (FREESTYLE LIBRE 2 READER) DEVI USE UNDER THE SKIN ONCE DAILY AS DIRECTED; Duration: 90     docusate sodium  (COLACE) 100 MG capsule Take 1 capsule (100 mg total) by mouth 2 (two) times daily as needed for mild constipation.     dorzolamide  (TRUSOPT ) 2 % ophthalmic solution Place 1 drop into the right eye 2 (two) times daily.     ezetimibe  (ZETIA ) 10 MG tablet TAKE 1 TABLET(10 MG) BY MOUTH DAILY AFTER SUPPER 90 tablet 1   furosemide  (LASIX ) 40 MG tablet Take 1 tablet (40 mg total) by mouth daily. 30 tablet 0   gabapentin  (NEURONTIN ) 300 MG capsule Take 2 capsules (600 mg total) by mouth 3 (three) times daily. 90 capsule 0   latanoprost  (XALATAN ) 0.005 % ophthalmic solution Place 1 drop into both eyes at bedtime.     losartan  (COZAAR ) 25 MG tablet TAKE 1 TABLET(25 MG) BY MOUTH DAILY 30 tablet 2   metoprolol  tartrate (LOPRESSOR ) 25 MG tablet Take 1  tablet (25 mg total) by mouth 2 (two) times daily. 60 tablet 0   Multiple Vitamin (MULTIVITAMIN WITH MINERALS) TABS tablet Take 1 tablet by mouth daily with breakfast.     NOVOLIN 70/30 (70-30) 100 UNIT/ML injection Inject 50 Units into the skin in the morning and at bedtime.     oxyCODONE  (OXY IR/ROXICODONE ) 5 MG immediate release tablet Take 1 tablet (5 mg total) by mouth every 6 (six) hours as needed for severe pain (pain score 7-10). (Patient not taking: Reported on 01/29/2024) 30 tablet 0   polyethylene glycol (MIRALAX  / GLYCOLAX ) 17 g packet Take 17 g by mouth daily as needed.     PRESCRIPTION MEDICATION CPAP- At bedtime     rosuvastatin  (CRESTOR ) 20 MG tablet Take 1 tablet (20 mg total) by mouth daily. 30 tablet 11   senna (SENOKOT) 8.6 MG TABS tablet Take 2 tablets by mouth at bedtime.     tamsulosin  (FLOMAX ) 0.4 MG CAPS capsule Take 0.4 mg by mouth daily.     timolol  (TIMOPTIC ) 0.5 % ophthalmic solution Place 1 drop into the right eye 2 (two) times daily.     zolpidem  (AMBIEN ) 5 MG tablet Take 5 mg by mouth at bedtime as needed for sleep.     No current facility-administered medications for this visit.    REVIEW OF SYSTEMS (negative unless checked):  Cardiac:  []  Chest pain or chest pressure? []  Shortness of breath upon activity? []  Shortness of breath when lying flat? []  Irregular heart rhythm?  Vascular:  []  Pain in calf, thigh, or hip brought on by walking? []  Pain in feet at night that wakes you up from your sleep? []  Blood clot in your veins? []  Leg swelling?  Pulmonary:  []  Oxygen  at home? []  Productive cough? []  Wheezing?  Neurologic:  []  Sudden weakness in arms or legs? []  Sudden numbness in arms or legs? []  Sudden onset of difficult speaking or slurred speech? []  Temporary loss of vision in one eye? []  Problems with dizziness?  Gastrointestinal:  []  Blood in stool? []  Vomited blood?  Genitourinary:  []  Burning when urinating? []  Blood in  urine?  Psychiatric:  []  Major depression  Hematologic:  []  Bleeding problems? []  Problems with blood clotting?  Dermatologic:  []  Rashes or ulcers?  Constitutional:  []  Fever or chills?  Ear/Nose/Throat:  []  Change in hearing? []  Nose bleeds? []  Sore throat?  Musculoskeletal:  []  Back pain? []  Joint pain? []  Muscle pain?   Physical Examination   Vitals:   03/05/24 1456  BP: 136/82  Pulse: 73  Temp: 97.7 F (36.5 C)  TempSrc: Temporal   There is no height or weight on file to calculate BMI.  General:  WDWN in NAD; vital signs documented above Gait: Not observed HENT: WNL, normocephalic Pulmonary: normal non-labored breathing Cardiac: Regular Abdomen: soft, NT, no masses Skin: without rashes Vascular Exam/Pulses: Brisk DP/PT Doppler signals bilaterally Extremities: Well-healed left ankle wound Musculoskeletal: no muscle wasting or atrophy  Neurologic: A&O X 3;  No focal weakness or paresthesias are detected Psychiatric:  The pt has Normal affect.  Non-Invasive Vascular imaging   ABI (03/05/2024) R:  ABI: 0.83 (0.96),  PT: mono DP: mono TBI: 0.63 L:  ABI: [place] ([place]),  PT: tri DP: tri TBI: 0.61   LLE Arterial Duplex (03/05/2024) Patent left popliteal artery stent without stenosis  Medical Decision Making   KAMANI LEWTER is a 83 y.o. male who presents for follow-up  Based on the patient's vascular studies, his ABIs on the right have slightly decreased from 0.96 to 0.83.  His ABIs in the left are noncompressible.  He has triphasic tibial vessel flow on the left. Arterial duplex of the left lower extremity demonstrates a patent left popliteal artery stent without stenosis His left ankle wound is now completely healed after previous ORIF.  He denies any other tissue loss.  He also denies any claudication or rest pain. On exam he has palpable femoral pulses bilaterally.  He has brisk DP/PT Doppler signals bilaterally. He can follow-up with our office  in 6 months with repeat left lower extremity arterial duplex and ABIs   Ahmed Holster PA-C [facility] Office: [phone]  Clinic MD: Lanis

## 2024-03-17 DIAGNOSIS — E785 Hyperlipidemia, unspecified: Secondary | ICD-10-CM | POA: Diagnosis not present

## 2024-03-24 DIAGNOSIS — Z23 Encounter for immunization: Secondary | ICD-10-CM | POA: Diagnosis not present

## 2024-04-09 ENCOUNTER — Ambulatory Visit: Admitting: Neurology

## 2024-04-09 ENCOUNTER — Encounter: Payer: Self-pay | Admitting: Neurology

## 2024-04-09 VITALS — BP 131/61 | HR 64 | Ht 71.0 in | Wt 250.0 lb

## 2024-04-09 DIAGNOSIS — G7241 Inclusion body myositis [IBM]: Secondary | ICD-10-CM

## 2024-04-09 DIAGNOSIS — I10 Essential (primary) hypertension: Secondary | ICD-10-CM | POA: Diagnosis not present

## 2024-04-09 NOTE — Progress Notes (Unsigned)
 Chief Complaint  Patient presents with   Consult    Pt in room 15. Daughter in room. Here to discuss Inclusion body myositis for second opinion.       ASSESSMENT AND PLAN  Andrew Larsen is a 83 y.o. male   Inclusion body myositis  Based on slow onset lower extremity weakness gait abnormality, frequent fall,  Muscle biopsy of right vastus lateralis showed inflammatory myopathy and neurogenic atrophy, positive NT5C1A antibody  Slowly progression, likely experience worsening symptoms  DIAGNOSTIC DATA (LABS, IMAGING, TESTING) - I reviewed patient records, labs, notes, testing and imaging myself where available.   MEDICAL HISTORY:  Andrew Larsen, is a 82 year old male, accompanied by his daughter, seen in request by [facility] Dr.   Larnell, [place], for inclusion body myositis,    History is obtained from the patient and review of electronic medical records. I personally reviewed pertinent available imaging films in PACS.   PMHx of  HTN DM HLD History of bilateral ankle fracture, required surgery Obstructive sleep apnea, using CPAP  He has been a patient of our clinic since August 2019, presenting of frequent falling, weakness in his legs, initially contributing to his ankle and knee issues, has to push-up from sitting position, unsteady gait, found to have mild elevation of CPK 750  He had a EMG nerve conduction study by Dr. Jenel August 2019, reported mild to moderate axonal peripheral neuropathy, also evidence of mild myopathic changes, most noticeable at proximal lower extremity muscles  Muscle biopsy of right vastus lateralis January 2020,  THERE IS EVIDENCE OF INFLAMMATORY MYOPATHY AND NEUROGENIC ATROPHY. BECAUSE OF THE PATIENT'S AGE AND THE ANGULAR ATROPHIC FIBERS, THE POSSIBILITY OF INCLUSION BODY MYOSITIS SHOULD BE CONSIDERED.  Shortly after that, he suffered COVID, hospitalized from July to August 2020, went to rehabilitation, since then, he had  worsening lower extremity weakness, difficulty walking,  Was seen by [facility] neurologist Dr. Creig in November 2020, diagnosing him with inclusion body myositis, positive for NT5C1A antibody at [facility] in June 2020, confirmed the diagnosis of inclusion body myositis, no treatment was offered  He has been living with his daughter, came in wheelchair today, slow progression of weakness, no longer ambulatory, also has mild left hand muscle weakness, hard to make a tight fist,  He denies double vision no swallowing difficulty,   Most recent lab 2025, normal CMP, CBC, CPK 288 April 2025  PHYSICAL EXAM:   Vitals:   04/09/24 1550  BP: 131/61  Pulse: 64  Weight: 250 lb (113.4 kg)  Height: 5' 11 (1.803 m)     Body mass index is 34.87 kg/m.  PHYSICAL EXAMNIATION:  Gen: NAD, conversant, well nourised, well groomed                     Cardiovascular: Regular rate rhythm, no peripheral edema, warm, nontender. Eyes: Conjunctivae clear without exudates or hemorrhage Neck: Supple, no carotid bruits. Pulmonary: Clear to auscultation bilaterally   NEUROLOGICAL EXAM:  MENTAL STATUS: Speech/cognition: Awake, alert, oriented to history taking and casual conversation CRANIAL NERVES: CN II: Visual fields are full to confrontation. Pupils are round equal and briskly reactive to light. CN III, IV, VI: extraocular movement are normal. No ptosis. CN V: Facial sensation is intact to light touch CN VII: Face is symmetric with normal eye closure  CN VIII: Hearing is normal to causal conversation. CN IX, X: Phonation is normal. CN XI: Head turning and shoulder shrug are intact  MOTOR: Antigravity  movement of upper extremity, no antigravity movement of bilateral lower extremity  REFLEXES: Reflexes are hypoactive and symmetric    SENSORY: Mild length-dependent sensory changes  COORDINATION: There is no trunk or limb dysmetria noted.  GAIT/STANCE: Deferred  REVIEW OF SYSTEMS:  Full 14 system review  of systems performed and notable only for as above All other review of systems were negative.   ALLERGIES: Allergies[1]  HOME MEDICATIONS: Current Outpatient Medications  Medication Sig Dispense Refill   aspirin  EC 81 MG tablet Take 1 tablet (81 mg total) by mouth daily. Swallow whole.     clopidogrel  (PLAVIX ) 75 MG tablet Take 1 tablet (75 mg total) by mouth daily. 90 tablet 3   Continuous Blood Gluc Sensor (FREESTYLE LIBRE 2 SENSOR) MISC Inject 1 Device into the skin every 14 (fourteen) days.     Continuous Glucose Receiver (FREESTYLE LIBRE 2 READER) DEVI USE UNDER THE SKIN ONCE DAILY AS DIRECTED; Duration: 90     dorzolamide  (TRUSOPT ) 2 % ophthalmic solution Place 1 drop into the right eye 2 (two) times daily.     ezetimibe  (ZETIA ) 10 MG tablet TAKE 1 TABLET(10 MG) BY MOUTH DAILY AFTER SUPPER 90 tablet 1   furosemide  (LASIX ) 40 MG tablet Take 1 tablet (40 mg total) by mouth daily. 30 tablet 0   gabapentin  (NEURONTIN ) 300 MG capsule Take 2 capsules (600 mg total) by mouth 3 (three) times daily. 90 capsule 0   latanoprost  (XALATAN ) 0.005 % ophthalmic solution Place 1 drop into both eyes at bedtime.     metoprolol  tartrate (LOPRESSOR ) 25 MG tablet Take 1 tablet (25 mg total) by mouth 2 (two) times daily. 60 tablet 0   Multiple Vitamin (MULTIVITAMIN WITH MINERALS) TABS tablet Take 1 tablet by mouth daily with breakfast.     NOVOLIN 70/30 (70-30) 100 UNIT/ML injection Inject 50 Units into the skin in the morning and at bedtime.     PRESCRIPTION MEDICATION CPAP- At bedtime     rosuvastatin  (CRESTOR ) 20 MG tablet Take 1 tablet (20 mg total) by mouth daily. 30 tablet 11   tamsulosin  (FLOMAX ) 0.4 MG CAPS capsule Take 0.4 mg by mouth daily.     timolol  (TIMOPTIC ) 0.5 % ophthalmic solution Place 1 drop into the right eye 2 (two) times daily.     No current facility-administered medications for this visit.    PAST MEDICAL HISTORY: Past Medical History:  Diagnosis Date   Arthritis    CHF  (congestive heart failure) (HCC)    per pt's daughter   CKD (chronic kidney disease)    CKD stage III (01/2018)   Daytime somnolence    Diabetes mellitus without complication (HCC)    Fracture    right fibula/wears boot cast   GERD (gastroesophageal reflux disease)    Glaucoma    Hypercholesteremia    Hyperlipidemia    Hypertension    Inclusion body myositis (HCC)    diagnosed 2020   Mold exposure    7 yrs ago   PAD (peripheral artery disease)    Prostate cancer (HCC)    Sleep apnea    uses cpap   Uses walker    Wears glasses     PAST SURGICAL HISTORY: Past Surgical History:  Procedure Laterality Date   ABDOMINAL AORTOGRAM N/A 11/13/2023   Procedure: ABDOMINAL AORTOGRAM;  Surgeon: Lanis Fonda BRAVO, MD;  Location: [facility] INVASIVE CV LAB;  Service: Cardiovascular;  Laterality: N/A;   ALLOGRAFT APPLICATION Left 08/27/2023   Procedure: APPLICATION, ALLOGRAFT, SKIN;  Surgeon: Beverley,  Evalene BIRCH, MD;  Location: [facility] OR;  Service: Orthopedics;  Laterality: Left;   APPLICATION OF WOUND VAC Left 08/27/2023   Procedure: APPLICATION, WOUND VAC;  Surgeon: Beverley Evalene BIRCH, MD;  Location: [facility] OR;  Service: Orthopedics;  Laterality: Left;   GOLD SEED IMPLANT N/A 01/08/2023   Procedure: GOLD SEED IMPLANT;  Surgeon: Lovie Arlyss CROME, MD;  Location: [facility];  Service: Urology;  Laterality: N/A;  30 MINUTES   HARDWARE REMOVAL Left 08/27/2023   Procedure: REMOVAL, HARDWARE;  Surgeon: Beverley Evalene BIRCH, MD;  Location: [facility] OR;  Service: Orthopedics;  Laterality: Left;   INCISION AND DRAINAGE OF DEEP ABSCESS, ANKLE Left 08/27/2023   Procedure: INCISION AND DRAINAGE OF DEEP ABSCESS, ANKLE;  Surgeon: Beverley Evalene BIRCH, MD;  Location: [facility] OR;  Service: Orthopedics;  Laterality: Left;   LOWER EXTREMITY ANGIOGRAPHY N/A 06/12/2016   Procedure: Lower Extremity Angiography;  Surgeon: Gordy Bergamo, MD;  Location: [facility] INVASIVE CV LAB;  Service: Cardiovascular;  Laterality: N/A;   LOWER EXTREMITY  ANGIOGRAPHY Left 11/13/2023   Procedure: Lower Extremity Angiography;  Surgeon: Lanis Fonda BRAVO, MD;  Location: [facility] INVASIVE CV LAB;  Service: Cardiovascular;  Laterality: Left;   LOWER EXTREMITY INTERVENTION Left 11/13/2023   Procedure: LOWER EXTREMITY INTERVENTION;  Surgeon: Lanis Fonda BRAVO, MD;  Location: [facility] INVASIVE CV LAB;  Service: Cardiovascular;  Laterality: Left;   MUSCLE BIOPSY Right 05/06/2018   Procedure: right vastus lateralis muscle biopsy;  Surgeon: Louis Shove, MD;  Location: [facility] OR;  Service: Neurosurgery;  Laterality: Right;   ORIF ANKLE FRACTURE Right 10/02/2012   Procedure: OPEN REDUCTION INTERNAL FIXATION (ORIF) ANKLE FRACTURE;  Surgeon: Cordella Gl[place]ia Hutchinson, MD;  Location: [facility] ORS;  Service: Orthopedics;  Laterality: Right;   ORIF ANKLE FRACTURE Left 07/21/2023   Procedure: OPEN REDUCTION INTERNAL FIXATION (ORIF) ANKLE FRACTURE;  Surgeon: Beverley Evalene BIRCH, MD;  Location: [facility] ORS;  Service: Orthopedics;  Laterality: Left;   PERIPHERAL VASCULAR ATHERECTOMY Left 06/12/2016   Procedure: Peripheral Vascular Atherectomy-Left Popliteal;  Surgeon: Gordy Bergamo, MD;  Location: [facility] INVASIVE CV LAB;  Service: Cardiovascular;  Laterality: Left;   PERIPHERAL VASCULAR INTERVENTION Left 06/12/2016   Procedure: Peripheral Vascular Intervention- DCB Left Popliteal;  Surgeon: Gordy Bergamo, MD;  Location: [facility] INVASIVE CV LAB;  Service: Cardiovascular;  Laterality: Left;   PROSTATE BIOPSY     ROTATION FLAP Left 08/27/2023   Procedure: CREATION, FLAP, ROTATION;  Surgeon: Beverley Evalene BIRCH, MD;  Location: [facility] OR;  Service: Orthopedics;  Laterality: Left;   SPACE OAR INSTILLATION N/A 01/08/2023   Procedure: SPACE OAR INSTILLATION;  Surgeon: Lovie Arlyss CROME, MD;  Location: [facility];  Service: Urology;  Laterality: N/A;    FAMILY HISTORY: Family History  Problem Relation Age of Onset   Heart disease Father    Prostate cancer Brother    Cancer Brother    Diabetes Brother    Colon cancer Neg  Hx    Sleep apnea Neg Hx     SOCIAL HISTORY: Social History   Socioeconomic History   Marital status: Widowed    Spouse name: Not on file   Number of children: 1   Years of education: Not on file   Highest education level: Not on file  Occupational History   Not on file  Tobacco Use   Smoking status: Former    Current packs/day: 0.00    Average packs/day: 1 pack/day for 20.0 years (20.0 ttl pk-yrs)    Types: Cigarettes    Start date: 08/22/1961  Quit date: 08/22/1981    Years since quitting: 42.6   Smokeless tobacco: Never  Vaping Use   Vaping status: Never Used  Substance and Sexual Activity   Alcohol  use: No    Alcohol /week: 0.0 standard drinks of alcohol    Drug use: No   Sexual activity: Never  Other Topics Concern   Not on file  Social History Narrative   ** Merged History Encounter **       Social Drivers of Health   Tobacco Use: Medium Risk (04/09/2024)   Patient History    Smoking Tobacco Use: Former    Smokeless Tobacco Use: Never    Passive Exposure: Not on Actuary Strain: Not on file  Food Insecurity: No Food Insecurity (08/27/2023)   Hunger Vital Sign    Worried About Running Out of Food in the Last Year: Never true    Ran Out of Food in the Last Year: Never true  Transportation Needs: No Transportation Needs (08/27/2023)   PRAPARE - Administrator, Civil Service (Medical): No    Lack of Transportation (Non-Medical): No  Physical Activity: Not on file  Stress: Not on file  Social Connections: Moderately Integrated (08/27/2023)   Social Connection and Isolation Panel    Frequency of Communication with Friends and Family: More than three times a week    Frequency of Social Gatherings with Friends and Family: More than three times a week    Attends Religious Services: More than 4 times per year    Active Member of [employer] or Organizations: Yes    Attends Banker Meetings: More than 4 times per year    Marital  Status: Widowed  Intimate Partner Violence: Not At Risk (08/27/2023)   Humiliation, Afraid, Rape, and Kick questionnaire    Fear of Current or Ex-Partner: No    Emotionally Abused: No    Physically Abused: No    Sexually Abused: No  Depression (PHQ2-9): Low Risk (09/26/2023)   Depression (PHQ2-9)    PHQ-2 Score: 0  Alcohol  Screen: Low Risk (10/31/2022)   Alcohol  Screen    Last Alcohol  Screening Score (AUDIT): 0  Housing: Low Risk (08/27/2023)   Housing Stability Vital Sign    Unable to Pay for Housing in the Last Year: No    Number of Times Moved in the Last Year: 0    Homeless in the Last Year: No  Utilities: Not At Risk (08/27/2023)   AHC Utilities    Threatened with loss of utilities: No  Health Literacy: Not on file      Modena Callander, M.D. Ph.D.  [facility] [address] [place], [place] [address] Ph: [phone] Fax: [phone]  CC:  Larnell Hamilton, MD [address] [place],  [place] [address]  Larnell Hamilton, MD    I personally spent a total of 55 minutes in the care of the patient today including preparing to see the patient, getting/reviewing separately obtained history, performing a medically appropriate exam/evaluation, counseling and educating, placing orders, referring and communicating with other health care professionals, documenting clinical information in the EHR, independently interpreting results, and communicating results.      [1]  Allergies Allergen Reactions   Metformin And Related Shortness Of Breath   Pollen Extract Itching and Other (See Comments)    Some wheezing, itchy eyes, runny nose   Simbrinza [Brinzolamide-Brimonidine ] Other (See Comments)    Drainage, redness to eyes

## 2024-05-09 ENCOUNTER — Telehealth: Admitting: Nurse Practitioner

## 2024-05-09 DIAGNOSIS — J40 Bronchitis, not specified as acute or chronic: Secondary | ICD-10-CM

## 2024-05-09 MED ORDER — AZITHROMYCIN 250 MG PO TABS: ORAL_TABLET | ORAL | 0 refills | Status: AC

## 2024-05-09 MED ORDER — PREDNISONE 20 MG PO TABS: 20.0000 mg | ORAL_TABLET | Freq: Every day | ORAL | 0 refills | Status: AC

## 2024-05-09 NOTE — Progress Notes (Signed)
 " Virtual Visit Consent   CORRIE Larsen, you are scheduled for a virtual visit with a [facility] provider today. Just as with appointments in the office, your consent must be obtained to participate. Your consent will be active for this visit and any virtual visit you may have with one of our providers in the next 365 days. If you have a MyChart account, a copy of this consent can be sent to you electronically.  As this is a virtual visit, video technology does not allow for your provider to perform a traditional examination. This may limit your provider's ability to fully assess your condition. If your provider identifies any concerns that need to be evaluated in person or the need to arrange testing (such as labs, EKG, etc.), we will make arrangements to do so. Although advances in technology are sophisticated, we cannot ensure that it will always work on either your end or our end. If the connection with a video visit is poor, the visit may have to be switched to a telephone visit. With either a video or telephone visit, we are not always able to ensure that we have a secure connection.  By engaging in this virtual visit, you consent to the provision of healthcare and authorize for your insurance to be billed (if applicable) for the services provided during this visit. Depending on your insurance coverage, you may receive a charge related to this service.  I need to obtain your verbal consent now. Are you willing to proceed with your visit today? Nancyann LITTIE Molly has provided verbal consent on 05/09/2024 for a virtual visit (video or telephone). Haze LELON Servant, NP  Date: 05/09/2024 3:58 PM   Virtual Visit via Video Note   I, Haze LELON Servant, connected with  Andrew Larsen  ([id number], August 11, 1940) on 05/09/2024 at  3:15 PM EST by a video-enabled telemedicine application and verified that I am speaking with the correct person using two identifiers.  Location: Patient: Virtual Visit Location  Patient: Home Provider: Virtual Visit Location Provider: Home Office   I discussed the limitations of evaluation and management by telemedicine and the availability of in person appointments. The patient expressed understanding and agreed to proceed.    History of Present Illness: Andrew Larsen is a 84 y.o. who identifies as a male who was assigned male at birth, and is being seen today for cough and congestion.  Over the past week Mr. Boomhower has been experiencing increased cough and wheezing, nasal congestion and most recently loose stools.  He has not taken an over-the-counter COVID or Flu test. He did have guests over to his home over the holidays and may have been in contact with someone who was sick.  He has been taking TheraFlu and Tussidex with no relief of his symptoms and is having a hard time clearing his secretions.       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 08/27/2023   ILD (interstitial lung disease) (HCC) 07/26/2023   PAD (peripheral artery disease) 07/23/2023   OSA (obstructive sleep apnea) 07/23/2023   BPH (benign prostatic hyperplasia) 07/23/2023   Trimalleolar fracture of ankle, closed, left, initial encounter 07/20/2023   Malignant neoplasm of prostate (HCC) 11/04/2022   Complicated UTI (urinary tract infection) 09/06/2021   Cough 01/07/2019  Labile blood glucose    SOB (shortness of breath)    Urinary frequency    Hypoalbuminemia due to protein-calorie malnutrition    Stage 3a chronic kidney disease (CKD) (HCC)    Physical debility 12/01/2018   Pressure injury of skin 11/27/2018   Inclusion body myositis (HCC) 11/07/2018   DMII (diabetes mellitus, type 2) (HCC) 11/03/2018   Essential hypertension 11/03/2018   Claudication in peripheral vascular  disease 06/10/2016   Morbid obesity (HCC) 09/21/2015   Upper airway cough syndrome 09/20/2015   Cough variant asthma 09/20/2015    Allergies: Allergies[1] Medications: Current Medications[2]  Observations/Objective: Patient is well-developed, well-nourished in no acute distress.  Resting comfortably at home.  Head is normocephalic, atraumatic.  No labored breathing.  Speech is clear and coherent with logical content.  Patient is alert and oriented at baseline.    Assessment and Plan: 1. Bronchitis (Primary) - azithromycin  (ZITHROMAX ) 250 MG tablet; Take 2 tablets on day 1, then 1 tablet daily on days 2 through 5  Dispense: 6 tablet; Refill: 0 - predniSONE  (DELTASONE ) 20 MG tablet; Take 1 tablet (20 mg total) by mouth daily with breakfast.  Dispense: 5 tablet; Refill: 0  INSTRUCTIONS: use a humidifier for nasal congestion Drink plenty of fluids, rest and wash hands frequently to avoid the spread of infection Alternate tylenol  and Motrin for relief of fever   Follow Up Instructions: I discussed the assessment and treatment plan with the patient. The patient was provided an opportunity to ask questions and all were answered. The patient agreed with the plan and demonstrated an understanding of the instructions.  A copy of instructions were sent to the patient via MyChart unless otherwise noted below.     The patient was advised to call back or seek an in-person evaluation if the symptoms worsen or if the condition fails to improve as anticipated.    Haze LELON Servant, NP     [1]  Allergies Allergen Reactions   Metformin And Related Shortness Of Breath   Pollen Extract Itching and Other (See Comments)    Some wheezing, itchy eyes, runny nose   Simbrinza [Brinzolamide-Brimonidine ] Other (See Comments)    Drainage, redness to eyes  [2]  Current Outpatient Medications:    azithromycin  (ZITHROMAX ) 250 MG tablet, Take 2 tablets on day 1, then 1 tablet daily on days 2 through 5,  Disp: 6 tablet, Rfl: 0   predniSONE  (DELTASONE ) 20 MG tablet, Take 1 tablet (20 mg total) by mouth daily with breakfast., Disp: 5 tablet, Rfl: 0   aspirin  EC 81 MG tablet, Take 1 tablet (81 mg total) by mouth daily. Swallow whole., Disp: , Rfl:    clopidogrel  (PLAVIX ) 75 MG tablet, Take 1 tablet (75 mg total) by mouth daily., Disp: 90 tablet, Rfl: 3   Continuous Blood Gluc Sensor (FREESTYLE LIBRE 2 SENSOR) MISC, Inject 1 Device into the skin every 14 (fourteen) days., Disp: , Rfl:    Continuous Glucose Receiver (FREESTYLE LIBRE 2 READER) DEVI, USE UNDER THE SKIN ONCE DAILY AS DIRECTED; Duration: 90, Disp: , Rfl:    dorzolamide  (TRUSOPT ) 2 % ophthalmic solution, Place 1 drop into the right eye 2 (two) times daily., Disp: , Rfl:    ezetimibe  (ZETIA ) 10 MG tablet, TAKE 1 TABLET(10 MG) BY MOUTH DAILY AFTER SUPPER, Disp: 90 tablet, Rfl: 1   furosemide  (LASIX ) 40 MG tablet, Take 1 tablet (40 mg total) by mouth daily., Disp: 30 tablet, Rfl: 0   gabapentin  (NEURONTIN ) 300 MG capsule, Take 2  capsules (600 mg total) by mouth 3 (three) times daily., Disp: 90 capsule, Rfl: 0   latanoprost  (XALATAN ) 0.005 % ophthalmic solution, Place 1 drop into both eyes at bedtime., Disp: , Rfl:    metoprolol  tartrate (LOPRESSOR ) 25 MG tablet, Take 1 tablet (25 mg total) by mouth 2 (two) times daily., Disp: 60 tablet, Rfl: 0   Multiple Vitamin (MULTIVITAMIN WITH MINERALS) TABS tablet, Take 1 tablet by mouth daily with breakfast., Disp: , Rfl:    NOVOLIN 70/30 (70-30) 100 UNIT/ML injection, Inject 50 Units into the skin in the morning and at bedtime., Disp: , Rfl:    PRESCRIPTION MEDICATION, CPAP- At bedtime, Disp: , Rfl:    rosuvastatin  (CRESTOR ) 20 MG tablet, Take 1 tablet (20 mg total) by mouth daily., Disp: 30 tablet, Rfl: 11   tamsulosin  (FLOMAX ) 0.4 MG CAPS capsule, Take 0.4 mg by mouth daily., Disp: , Rfl:    timolol  (TIMOPTIC ) 0.5 % ophthalmic solution, Place 1 drop into the right eye 2 (two) times daily., Disp: , Rfl:    "

## 2024-05-09 NOTE — Patient Instructions (Signed)
 " Andrew Larsen, thank you for joining Haze LELON Servant, NP for today's virtual visit.  While this provider is not your primary care provider (PCP), if your PCP is located in our provider database this encounter information will be shared with them immediately following your visit.   A [facility] MyChart account gives you access to today's visit and all your visits, tests, and labs performed at [facility]  click here if you don't have a [facility] MyChart account or go to [email or web address]  Consent: (Patient) Andrew Larsen provided verbal consent for this virtual visit at the beginning of the encounter.  Current Medications:  Current Outpatient Medications:    azithromycin  (ZITHROMAX ) 250 MG tablet, Take 2 tablets on day 1, then 1 tablet daily on days 2 through 5, Disp: 6 tablet, Rfl: 0   predniSONE  (DELTASONE ) 20 MG tablet, Take 1 tablet (20 mg total) by mouth daily with breakfast., Disp: 5 tablet, Rfl: 0   aspirin  EC 81 MG tablet, Take 1 tablet (81 mg total) by mouth daily. Swallow whole., Disp: , Rfl:    clopidogrel  (PLAVIX ) 75 MG tablet, Take 1 tablet (75 mg total) by mouth daily., Disp: 90 tablet, Rfl: 3   Continuous Blood Gluc Sensor (FREESTYLE LIBRE 2 SENSOR) MISC, Inject 1 Device into the skin every 14 (fourteen) days., Disp: , Rfl:    Continuous Glucose Receiver (FREESTYLE LIBRE 2 READER) DEVI, USE UNDER THE SKIN ONCE DAILY AS DIRECTED; Duration: 90, Disp: , Rfl:    dorzolamide  (TRUSOPT ) 2 % ophthalmic solution, Place 1 drop into the right eye 2 (two) times daily., Disp: , Rfl:    ezetimibe  (ZETIA ) 10 MG tablet, TAKE 1 TABLET(10 MG) BY MOUTH DAILY AFTER SUPPER, Disp: 90 tablet, Rfl: 1   furosemide  (LASIX ) 40 MG tablet, Take 1 tablet (40 mg total) by mouth daily., Disp: 30 tablet, Rfl: 0   gabapentin  (NEURONTIN ) 300 MG capsule, Take 2 capsules (600 mg total) by mouth 3 (three) times daily., Disp: 90 capsule, Rfl: 0   latanoprost  (XALATAN ) 0.005 % ophthalmic  solution, Place 1 drop into both eyes at bedtime., Disp: , Rfl:    metoprolol  tartrate (LOPRESSOR ) 25 MG tablet, Take 1 tablet (25 mg total) by mouth 2 (two) times daily., Disp: 60 tablet, Rfl: 0   Multiple Vitamin (MULTIVITAMIN WITH MINERALS) TABS tablet, Take 1 tablet by mouth daily with breakfast., Disp: , Rfl:    NOVOLIN 70/30 (70-30) 100 UNIT/ML injection, Inject 50 Units into the skin in the morning and at bedtime., Disp: , Rfl:    PRESCRIPTION MEDICATION, CPAP- At bedtime, Disp: , Rfl:    rosuvastatin  (CRESTOR ) 20 MG tablet, Take 1 tablet (20 mg total) by mouth daily., Disp: 30 tablet, Rfl: 11   tamsulosin  (FLOMAX ) 0.4 MG CAPS capsule, Take 0.4 mg by mouth daily., Disp: , Rfl:    timolol  (TIMOPTIC ) 0.5 % ophthalmic solution, Place 1 drop into the right eye 2 (two) times daily., Disp: , Rfl:    Medications ordered in this encounter:  Meds ordered this encounter  Medications   azithromycin  (ZITHROMAX ) 250 MG tablet    Sig: Take 2 tablets on day 1, then 1 tablet daily on days 2 through 5    Dispense:  6 tablet    Refill:  0    Supervising Provider:   LAMPTEY, PHILIP O [[id number]]   predniSONE  (DELTASONE ) 20 MG tablet    Sig: Take 1 tablet (20 mg total) by mouth daily with breakfast.    Dispense:  5 tablet    Refill:  0    Supervising Provider:   BLAISE ALEENE KIDD [[id number]]     *If you need refills on other medications prior to your next appointment, please contact your pharmacy*  Follow-Up: Call back or seek an in-person evaluation if the symptoms worsen or if the condition fails to improve as anticipated.  [facility] [phone]  Other Instructions INSTRUCTIONS: use a humidifier for nasal congestion Drink plenty of fluids, rest and wash hands frequently to avoid the spread of infection Alternate tylenol  and Motrin for relief of fever    If you have been instructed to have an in-person evaluation today at a local Urgent Care facility, please use the link below.  It will take you to a list of all of our available [facility], including address, phone number and hours of operation. Please do not delay care.  [facility]  If you or a family member do not have a primary care provider, use the link below to schedule a visit and establish care. When you choose a [facility] primary care physician or advanced practice provider, you gain a long-term partner in health. Find a Primary Care Provider  Learn more about [facility]'s in-office and virtual care options: [facility] - Get Care Now  "

## 2024-09-03 ENCOUNTER — Ambulatory Visit (HOSPITAL_COMMUNITY)

## 2024-09-03 ENCOUNTER — Ambulatory Visit

## 2025-02-04 ENCOUNTER — Ambulatory Visit: Admitting: Adult Health
